# Patient Record
Sex: Male | Born: 1950
Health system: Southern US, Community
[De-identification: ages and names within clinical notes are randomized; demographics above are authoritative.]

## PROBLEM LIST (undated history)

## (undated) DIAGNOSIS — G249 Dystonia, unspecified: Secondary | ICD-10-CM

## (undated) DIAGNOSIS — R590 Localized enlarged lymph nodes: Secondary | ICD-10-CM

## (undated) DIAGNOSIS — G20 Parkinson's disease: Secondary | ICD-10-CM

## (undated) DIAGNOSIS — C8590 Non-Hodgkin lymphoma, unspecified, unspecified site: Secondary | ICD-10-CM

## (undated) HISTORY — DX: Dystonia, unspecified: G24.9

## (undated) HISTORY — DX: Non-Hodgkin lymphoma, unspecified, unspecified site: C85.90

## (undated) HISTORY — PX: TONSILLECTOMY: SUR1361

## (undated) HISTORY — PX: PORTACATH PLACEMENT: SHX2246

## (undated) HISTORY — DX: Parkinson's disease: G20

## (undated) HISTORY — PX: PORT-A-CATH REMOVAL: SHX5289

## (undated) HISTORY — DX: Localized enlarged lymph nodes: R59.0

## (undated) HISTORY — PX: CHOLECYSTECTOMY, LAPAROSCOPIC: SHX56

## (undated) HISTORY — PX: SPLENECTOMY: SUR1306

## (undated) HISTORY — PX: EYE SURGERY: SHX253

---

## 1992-09-22 DIAGNOSIS — G249 Dystonia, unspecified: Secondary | ICD-10-CM

## 1992-09-22 HISTORY — DX: Dystonia, unspecified: G24.9

## 2005-09-22 DIAGNOSIS — C8590 Non-Hodgkin lymphoma, unspecified, unspecified site: Secondary | ICD-10-CM

## 2005-09-22 HISTORY — DX: Non-Hodgkin lymphoma, unspecified, unspecified site: C85.90

## 2005-12-26 ENCOUNTER — Emergency Department (HOSPITAL_COMMUNITY): Admission: EM | Admit: 2005-12-26 | Discharge: 2005-12-26 | Payer: Self-pay | Admitting: Emergency Medicine

## 2006-04-30 ENCOUNTER — Ambulatory Visit: Payer: Self-pay | Admitting: Family Medicine

## 2006-05-04 ENCOUNTER — Ambulatory Visit: Payer: Self-pay | Admitting: Family Medicine

## 2006-05-12 ENCOUNTER — Ambulatory Visit: Payer: Self-pay | Admitting: Infectious Diseases

## 2006-05-27 ENCOUNTER — Ambulatory Visit: Payer: Self-pay | Admitting: Infectious Diseases

## 2006-06-10 ENCOUNTER — Ambulatory Visit: Payer: Self-pay | Admitting: Infectious Diseases

## 2006-06-16 ENCOUNTER — Ambulatory Visit: Payer: Self-pay | Admitting: Family Medicine

## 2006-06-22 ENCOUNTER — Encounter (INDEPENDENT_AMBULATORY_CARE_PROVIDER_SITE_OTHER): Payer: Self-pay | Admitting: *Deleted

## 2006-06-22 ENCOUNTER — Ambulatory Visit (HOSPITAL_COMMUNITY): Admission: RE | Admit: 2006-06-22 | Discharge: 2006-06-22 | Payer: Self-pay

## 2006-06-26 ENCOUNTER — Ambulatory Visit: Payer: Self-pay | Admitting: Infectious Diseases

## 2006-07-03 ENCOUNTER — Ambulatory Visit: Payer: Self-pay | Admitting: Infectious Diseases

## 2006-07-03 ENCOUNTER — Inpatient Hospital Stay (HOSPITAL_COMMUNITY): Admission: AD | Admit: 2006-07-03 | Discharge: 2006-07-10 | Payer: Self-pay | Admitting: Internal Medicine

## 2006-07-05 ENCOUNTER — Encounter (INDEPENDENT_AMBULATORY_CARE_PROVIDER_SITE_OTHER): Payer: Self-pay

## 2006-07-05 ENCOUNTER — Encounter (INDEPENDENT_AMBULATORY_CARE_PROVIDER_SITE_OTHER): Payer: Self-pay | Admitting: *Deleted

## 2006-07-09 ENCOUNTER — Ambulatory Visit: Payer: Self-pay | Admitting: Hematology & Oncology

## 2006-07-22 ENCOUNTER — Inpatient Hospital Stay (HOSPITAL_COMMUNITY): Admission: AD | Admit: 2006-07-22 | Discharge: 2006-07-25 | Payer: Self-pay

## 2006-07-22 ENCOUNTER — Ambulatory Visit: Payer: Self-pay | Admitting: Family Medicine

## 2006-07-22 LAB — CONVERTED CEMR LAB
Basophils Relative: 0.3 % (ref 0.0–1.0)
Eosinophil percent: 0.2 % (ref 0.0–5.0)
Lymphocytes Relative: 4.5 % — ABNORMAL LOW (ref 12.0–46.0)
Neutro Abs: 24.3 10*3/uL — ABNORMAL HIGH (ref 1.4–7.7)
Platelets: 980 10*3/uL — ABNORMAL HIGH (ref 150–400)
WBC: 27.2 10*3/uL (ref 4.5–10.5)

## 2006-08-05 ENCOUNTER — Ambulatory Visit: Payer: Self-pay | Admitting: Hematology & Oncology

## 2006-08-06 ENCOUNTER — Encounter: Admission: RE | Admit: 2006-08-06 | Discharge: 2006-08-06 | Payer: Self-pay

## 2006-08-11 LAB — CBC WITH DIFFERENTIAL/PLATELET
BASO%: 0 % (ref 0.0–2.0)
LYMPH%: 11.3 % — ABNORMAL LOW (ref 14.0–48.0)
MCH: 30.2 pg (ref 28.0–33.4)
MCHC: 32.4 g/dL (ref 32.0–35.9)
MCV: 93.2 fL (ref 81.6–98.0)
MONO%: 15.1 % — ABNORMAL HIGH (ref 0.0–13.0)
Platelets: 692 10*3/uL — ABNORMAL HIGH (ref 145–400)
RBC: 3.7 10*6/uL — ABNORMAL LOW (ref 4.20–5.71)

## 2006-08-11 LAB — COMPREHENSIVE METABOLIC PANEL
Alkaline Phosphatase: 176 U/L — ABNORMAL HIGH (ref 39–117)
Glucose, Bld: 109 mg/dL — ABNORMAL HIGH (ref 70–99)
Sodium: 135 mEq/L (ref 135–145)
Total Bilirubin: 0.4 mg/dL (ref 0.3–1.2)
Total Protein: 7.1 g/dL (ref 6.0–8.3)

## 2006-08-18 ENCOUNTER — Encounter (INDEPENDENT_AMBULATORY_CARE_PROVIDER_SITE_OTHER): Payer: Self-pay | Admitting: Specialist

## 2006-08-18 ENCOUNTER — Ambulatory Visit (HOSPITAL_COMMUNITY): Admission: RE | Admit: 2006-08-18 | Discharge: 2006-08-18 | Payer: Self-pay | Admitting: Hematology & Oncology

## 2006-08-19 ENCOUNTER — Ambulatory Visit (HOSPITAL_COMMUNITY): Admission: RE | Admit: 2006-08-19 | Discharge: 2006-08-19 | Payer: Self-pay | Admitting: Hematology & Oncology

## 2006-08-24 ENCOUNTER — Encounter: Admission: RE | Admit: 2006-08-24 | Discharge: 2006-08-24 | Payer: Self-pay

## 2006-08-25 ENCOUNTER — Ambulatory Visit (HOSPITAL_COMMUNITY): Admission: RE | Admit: 2006-08-25 | Discharge: 2006-08-26 | Payer: Self-pay

## 2006-08-25 ENCOUNTER — Encounter (INDEPENDENT_AMBULATORY_CARE_PROVIDER_SITE_OTHER): Payer: Self-pay | Admitting: *Deleted

## 2006-09-01 LAB — CBC WITH DIFFERENTIAL/PLATELET
Basophils Absolute: 0 10*3/uL (ref 0.0–0.1)
Eosinophils Absolute: 0.3 10*3/uL (ref 0.0–0.5)
HGB: 9.7 g/dL — ABNORMAL LOW (ref 13.0–17.1)
MCV: 92.7 fL (ref 81.6–98.0)
NEUT#: 19.3 10*3/uL — ABNORMAL HIGH (ref 1.5–6.5)
RDW: 17.3 % — ABNORMAL HIGH (ref 11.2–14.6)
lymph#: 0.9 10*3/uL (ref 0.9–3.3)

## 2006-09-01 LAB — COMPREHENSIVE METABOLIC PANEL
Albumin: 2.8 g/dL — ABNORMAL LOW (ref 3.5–5.2)
BUN: 10 mg/dL (ref 6–23)
CO2: 25 mEq/L (ref 19–32)
Calcium: 8.1 mg/dL — ABNORMAL LOW (ref 8.4–10.5)
Chloride: 103 mEq/L (ref 96–112)
Glucose, Bld: 103 mg/dL — ABNORMAL HIGH (ref 70–99)
Potassium: 4 mEq/L (ref 3.5–5.3)

## 2006-09-01 LAB — PROTHROMBIN TIME
INR: 1.1 (ref 0.0–1.5)
Prothrombin Time: 14.3 seconds (ref 11.6–15.2)

## 2006-09-02 ENCOUNTER — Ambulatory Visit (HOSPITAL_BASED_OUTPATIENT_CLINIC_OR_DEPARTMENT_OTHER): Admission: RE | Admit: 2006-09-02 | Discharge: 2006-09-02 | Payer: Self-pay

## 2006-09-10 ENCOUNTER — Ambulatory Visit: Payer: Self-pay | Admitting: Hematology & Oncology

## 2006-09-16 LAB — CBC WITH DIFFERENTIAL/PLATELET
BASO%: 0.7 % (ref 0.0–2.0)
Basophils Absolute: 0.1 10*3/uL (ref 0.0–0.1)
EOS%: 0.9 % (ref 0.0–7.0)
LYMPH%: 32.3 % (ref 14.0–48.0)
MCH: 31.3 pg (ref 28.0–33.4)
MCV: 98.7 fL — ABNORMAL HIGH (ref 81.6–98.0)
MONO%: 5.3 % (ref 0.0–13.0)
RBC: 3.08 10*6/uL — ABNORMAL LOW (ref 4.20–5.71)
RDW: 22.3 % — ABNORMAL HIGH (ref 11.2–14.6)

## 2006-09-24 LAB — CBC WITH DIFFERENTIAL/PLATELET
BASO%: 1.8 % (ref 0.0–2.0)
Basophils Absolute: 0.2 10*3/uL — ABNORMAL HIGH (ref 0.0–0.1)
Eosinophils Absolute: 0.1 10*3/uL (ref 0.0–0.5)
HCT: 35.7 % — ABNORMAL LOW (ref 38.7–49.9)
LYMPH%: 26.9 % (ref 14.0–48.0)
MCHC: 33 g/dL (ref 32.0–35.9)
MONO#: 1.5 10*3/uL — ABNORMAL HIGH (ref 0.1–0.9)
NEUT%: 55.1 % (ref 40.0–75.0)
Platelets: 572 10*3/uL — ABNORMAL HIGH (ref 145–400)
WBC: 9.7 10*3/uL (ref 4.0–10.0)

## 2006-10-07 ENCOUNTER — Ambulatory Visit (HOSPITAL_COMMUNITY): Admission: RE | Admit: 2006-10-07 | Discharge: 2006-10-07 | Payer: Self-pay | Admitting: Hematology & Oncology

## 2006-10-15 LAB — CBC WITH DIFFERENTIAL/PLATELET
BASO%: 1.3 % (ref 0.0–2.0)
EOS%: 2 % (ref 0.0–7.0)
HCT: 37.3 % — ABNORMAL LOW (ref 38.7–49.9)
LYMPH%: 24.6 % (ref 14.0–48.0)
MCH: 33.3 pg (ref 28.0–33.4)
MCHC: 33 g/dL (ref 32.0–35.9)
MCV: 100.8 fL — ABNORMAL HIGH (ref 81.6–98.0)
MONO%: 14.2 % — ABNORMAL HIGH (ref 0.0–13.0)
NEUT%: 57.9 % (ref 40.0–75.0)
Platelets: 622 10*3/uL — ABNORMAL HIGH (ref 145–400)

## 2006-10-15 LAB — COMPREHENSIVE METABOLIC PANEL
ALT: 36 U/L (ref 0–53)
AST: 27 U/L (ref 0–37)
Albumin: 4.1 g/dL (ref 3.5–5.2)
BUN: 13 mg/dL (ref 6–23)
Calcium: 9.3 mg/dL (ref 8.4–10.5)
Chloride: 106 mEq/L (ref 96–112)
Potassium: 4.6 mEq/L (ref 3.5–5.3)
Sodium: 143 mEq/L (ref 135–145)
Total Protein: 6.5 g/dL (ref 6.0–8.3)

## 2006-11-02 ENCOUNTER — Ambulatory Visit: Payer: Self-pay | Admitting: Hematology & Oncology

## 2006-11-05 LAB — CBC WITH DIFFERENTIAL/PLATELET
EOS%: 2.1 % (ref 0.0–7.0)
LYMPH%: 19.7 % (ref 14.0–48.0)
MCH: 33.9 pg — ABNORMAL HIGH (ref 28.0–33.4)
MCHC: 34 g/dL (ref 32.0–35.9)
MCV: 99.6 fL — ABNORMAL HIGH (ref 81.6–98.0)
MONO%: 8.4 % (ref 0.0–13.0)
Platelets: 486 10*3/uL — ABNORMAL HIGH (ref 145–400)
RBC: 3.81 10*6/uL — ABNORMAL LOW (ref 4.20–5.71)
RDW: 22.2 % — ABNORMAL HIGH (ref 11.2–14.6)

## 2006-11-05 LAB — COMPREHENSIVE METABOLIC PANEL
AST: 26 U/L (ref 0–37)
Albumin: 4 g/dL (ref 3.5–5.2)
Alkaline Phosphatase: 95 U/L (ref 39–117)
Potassium: 4.4 mEq/L (ref 3.5–5.3)
Sodium: 143 mEq/L (ref 135–145)
Total Bilirubin: 0.3 mg/dL (ref 0.3–1.2)
Total Protein: 6.5 g/dL (ref 6.0–8.3)

## 2006-11-25 LAB — COMPREHENSIVE METABOLIC PANEL
ALT: 39 U/L (ref 0–53)
BUN: 14 mg/dL (ref 6–23)
CO2: 26 mEq/L (ref 19–32)
Calcium: 9.2 mg/dL (ref 8.4–10.5)
Chloride: 105 mEq/L (ref 96–112)
Creatinine, Ser: 0.77 mg/dL (ref 0.40–1.50)
Glucose, Bld: 102 mg/dL — ABNORMAL HIGH (ref 70–99)
Total Bilirubin: 0.3 mg/dL (ref 0.3–1.2)

## 2006-11-25 LAB — CBC WITH DIFFERENTIAL/PLATELET
Eosinophils Absolute: 0.1 10*3/uL (ref 0.0–0.5)
HCT: 39.4 % (ref 38.7–49.9)
LYMPH%: 18.1 % (ref 14.0–48.0)
MCHC: 34.1 g/dL (ref 32.0–35.9)
MCV: 100.5 fL — ABNORMAL HIGH (ref 81.6–98.0)
MONO#: 1.2 10*3/uL — ABNORMAL HIGH (ref 0.1–0.9)
MONO%: 11.6 % (ref 0.0–13.0)
NEUT#: 7 10*3/uL — ABNORMAL HIGH (ref 1.5–6.5)
NEUT%: 67.9 % (ref 40.0–75.0)
Platelets: 396 10*3/uL (ref 145–400)
RBC: 3.92 10*6/uL — ABNORMAL LOW (ref 4.20–5.71)
WBC: 10.3 10*3/uL — ABNORMAL HIGH (ref 4.0–10.0)

## 2006-12-16 LAB — CBC WITH DIFFERENTIAL/PLATELET
BASO%: 3 % — ABNORMAL HIGH (ref 0.0–2.0)
Eosinophils Absolute: 0.1 10*3/uL (ref 0.0–0.5)
HCT: 40.2 % (ref 38.7–49.9)
MCHC: 34.5 g/dL (ref 32.0–35.9)
MONO#: 1.6 10*3/uL — ABNORMAL HIGH (ref 0.1–0.9)
NEUT#: 3.5 10*3/uL (ref 1.5–6.5)
NEUT%: 42.1 % (ref 40.0–75.0)
WBC: 8.4 10*3/uL (ref 4.0–10.0)
lymph#: 3 10*3/uL (ref 0.9–3.3)

## 2006-12-16 LAB — COMPREHENSIVE METABOLIC PANEL
ALT: 51 U/L (ref 0–53)
AST: 38 U/L — ABNORMAL HIGH (ref 0–37)
Albumin: 4.3 g/dL (ref 3.5–5.2)
BUN: 11 mg/dL (ref 6–23)
CO2: 27 mEq/L (ref 19–32)
Calcium: 9.1 mg/dL (ref 8.4–10.5)
Chloride: 105 mEq/L (ref 96–112)
Potassium: 4.3 mEq/L (ref 3.5–5.3)

## 2006-12-16 LAB — TECHNOLOGIST REVIEW

## 2006-12-16 LAB — LACTATE DEHYDROGENASE: LDH: 145 U/L (ref 94–250)

## 2006-12-18 ENCOUNTER — Ambulatory Visit: Payer: Self-pay | Admitting: Hematology & Oncology

## 2007-01-12 ENCOUNTER — Ambulatory Visit (HOSPITAL_COMMUNITY): Admission: RE | Admit: 2007-01-12 | Discharge: 2007-01-12 | Payer: Self-pay | Admitting: Hematology & Oncology

## 2007-01-28 LAB — COMPREHENSIVE METABOLIC PANEL
Albumin: 4.3 g/dL (ref 3.5–5.2)
Alkaline Phosphatase: 72 U/L (ref 39–117)
BUN: 11 mg/dL (ref 6–23)
Glucose, Bld: 121 mg/dL — ABNORMAL HIGH (ref 70–99)
Total Bilirubin: 0.6 mg/dL (ref 0.3–1.2)

## 2007-01-28 LAB — CBC WITH DIFFERENTIAL/PLATELET
Basophils Absolute: 0.1 10*3/uL (ref 0.0–0.1)
Eosinophils Absolute: 0.8 10*3/uL — ABNORMAL HIGH (ref 0.0–0.5)
HCT: 42.7 % (ref 38.7–49.9)
HGB: 14.6 g/dL (ref 13.0–17.1)
LYMPH%: 32 % (ref 14.0–48.0)
MCV: 99.2 fL — ABNORMAL HIGH (ref 81.6–98.0)
MONO%: 13.3 % — ABNORMAL HIGH (ref 0.0–13.0)
NEUT#: 2.7 10*3/uL (ref 1.5–6.5)
Platelets: 280 10*3/uL (ref 145–400)

## 2007-01-28 LAB — LACTATE DEHYDROGENASE: LDH: 150 U/L (ref 94–250)

## 2007-02-02 ENCOUNTER — Ambulatory Visit: Payer: Self-pay | Admitting: Hematology & Oncology

## 2007-02-25 ENCOUNTER — Encounter (INDEPENDENT_AMBULATORY_CARE_PROVIDER_SITE_OTHER): Payer: Self-pay | Admitting: Family Medicine

## 2007-02-25 LAB — COMPREHENSIVE METABOLIC PANEL
ALT: 43 U/L (ref 0–53)
Albumin: 4.2 g/dL (ref 3.5–5.2)
CO2: 26 mEq/L (ref 19–32)
Calcium: 8.9 mg/dL (ref 8.4–10.5)
Chloride: 107 mEq/L (ref 96–112)
Glucose, Bld: 137 mg/dL — ABNORMAL HIGH (ref 70–99)
Sodium: 141 mEq/L (ref 135–145)
Total Bilirubin: 0.5 mg/dL (ref 0.3–1.2)
Total Protein: 6.5 g/dL (ref 6.0–8.3)

## 2007-02-25 LAB — CBC WITH DIFFERENTIAL/PLATELET
Eosinophils Absolute: 0.3 10*3/uL (ref 0.0–0.5)
HCT: 40 % (ref 38.7–49.9)
LYMPH%: 32.7 % (ref 14.0–48.0)
MONO#: 0.5 10*3/uL (ref 0.1–0.9)
NEUT#: 2.3 10*3/uL (ref 1.5–6.5)
Platelets: 317 10*3/uL (ref 145–400)
RBC: 4.09 10*6/uL — ABNORMAL LOW (ref 4.20–5.71)
WBC: 4.6 10*3/uL (ref 4.0–10.0)
lymph#: 1.5 10*3/uL (ref 0.9–3.3)

## 2007-02-25 LAB — LACTATE DEHYDROGENASE: LDH: 152 U/L (ref 94–250)

## 2007-03-23 DIAGNOSIS — C8590 Non-Hodgkin lymphoma, unspecified, unspecified site: Secondary | ICD-10-CM

## 2007-04-06 ENCOUNTER — Ambulatory Visit: Payer: Self-pay | Admitting: Hematology & Oncology

## 2007-04-08 LAB — CBC WITH DIFFERENTIAL/PLATELET
BASO%: 0.6 % (ref 0.0–2.0)
Basophils Absolute: 0 10*3/uL (ref 0.0–0.1)
EOS%: 6.9 % (ref 0.0–7.0)
Eosinophils Absolute: 0.4 10*3/uL (ref 0.0–0.5)
HCT: 42.8 % (ref 38.7–49.9)
HGB: 14.9 g/dL (ref 13.0–17.1)
LYMPH%: 32.5 % (ref 14.0–48.0)
MCH: 33.5 pg — ABNORMAL HIGH (ref 28.0–33.4)
MCHC: 34.9 g/dL (ref 32.0–35.9)
MCV: 96.3 fL (ref 81.6–98.0)
MONO#: 0.6 10*3/uL (ref 0.1–0.9)
MONO%: 10.8 % (ref 0.0–13.0)
NEUT#: 2.9 10*3/uL (ref 1.5–6.5)
NEUT%: 49.2 % (ref 40.0–75.0)
Platelets: 298 10*3/uL (ref 145–400)
RBC: 4.45 10*6/uL (ref 4.20–5.71)
RDW: 13.5 % (ref 11.2–14.6)
WBC: 5.8 10*3/uL (ref 4.0–10.0)
lymph#: 1.9 10*3/uL (ref 0.9–3.3)

## 2007-04-08 LAB — LACTATE DEHYDROGENASE: LDH: 167 U/L (ref 94–250)

## 2007-04-08 LAB — COMPREHENSIVE METABOLIC PANEL
Albumin: 4.4 g/dL (ref 3.5–5.2)
BUN: 11 mg/dL (ref 6–23)
CO2: 28 mEq/L (ref 19–32)
Calcium: 9 mg/dL (ref 8.4–10.5)
Chloride: 104 mEq/L (ref 96–112)
Creatinine, Ser: 0.82 mg/dL (ref 0.40–1.50)
Glucose, Bld: 96 mg/dL (ref 70–99)
Potassium: 4.2 mEq/L (ref 3.5–5.3)

## 2007-06-03 ENCOUNTER — Ambulatory Visit (HOSPITAL_COMMUNITY): Admission: RE | Admit: 2007-06-03 | Discharge: 2007-06-03 | Payer: Self-pay | Admitting: Hematology & Oncology

## 2007-06-08 ENCOUNTER — Ambulatory Visit: Payer: Self-pay | Admitting: Hematology & Oncology

## 2007-06-10 ENCOUNTER — Encounter (INDEPENDENT_AMBULATORY_CARE_PROVIDER_SITE_OTHER): Payer: Self-pay | Admitting: Family Medicine

## 2007-06-10 LAB — CBC WITH DIFFERENTIAL/PLATELET
Eosinophils Absolute: 0.5 10*3/uL (ref 0.0–0.5)
MCV: 95.3 fL (ref 81.6–98.0)
MONO#: 0.7 10*3/uL (ref 0.1–0.9)
MONO%: 11.2 % (ref 0.0–13.0)
NEUT#: 2.5 10*3/uL (ref 1.5–6.5)
RBC: 4.37 10*6/uL (ref 4.20–5.71)
RDW: 14.6 % (ref 11.2–14.6)
WBC: 5.8 10*3/uL (ref 4.0–10.0)
lymph#: 2.1 10*3/uL (ref 0.9–3.3)

## 2007-06-10 LAB — COMPREHENSIVE METABOLIC PANEL
AST: 25 U/L (ref 0–37)
Albumin: 4.4 g/dL (ref 3.5–5.2)
Alkaline Phosphatase: 67 U/L (ref 39–117)
Chloride: 105 mEq/L (ref 96–112)
Glucose, Bld: 120 mg/dL — ABNORMAL HIGH (ref 70–99)
Potassium: 4.4 mEq/L (ref 3.5–5.3)
Sodium: 142 mEq/L (ref 135–145)
Total Protein: 6.6 g/dL (ref 6.0–8.3)

## 2007-07-28 ENCOUNTER — Ambulatory Visit: Payer: Self-pay | Admitting: Family Medicine

## 2007-07-28 DIAGNOSIS — R599 Enlarged lymph nodes, unspecified: Secondary | ICD-10-CM | POA: Insufficient documentation

## 2007-07-28 HISTORY — DX: Enlarged lymph nodes, unspecified: R59.9

## 2007-08-03 ENCOUNTER — Ambulatory Visit: Payer: Self-pay | Admitting: Hematology & Oncology

## 2007-08-05 ENCOUNTER — Encounter (INDEPENDENT_AMBULATORY_CARE_PROVIDER_SITE_OTHER): Payer: Self-pay | Admitting: Family Medicine

## 2007-08-05 LAB — COMPREHENSIVE METABOLIC PANEL
AST: 28 U/L (ref 0–37)
Alkaline Phosphatase: 68 U/L (ref 39–117)
BUN: 11 mg/dL (ref 6–23)
Glucose, Bld: 124 mg/dL — ABNORMAL HIGH (ref 70–99)
Total Bilirubin: 0.5 mg/dL (ref 0.3–1.2)

## 2007-08-05 LAB — CBC WITH DIFFERENTIAL/PLATELET
Basophils Absolute: 0.1 10*3/uL (ref 0.0–0.1)
EOS%: 10.7 % — ABNORMAL HIGH (ref 0.0–7.0)
Eosinophils Absolute: 0.5 10*3/uL (ref 0.0–0.5)
LYMPH%: 41.4 % (ref 14.0–48.0)
MCH: 33 pg (ref 28.0–33.4)
MCV: 95.2 fL (ref 81.6–98.0)
MONO%: 17.5 % — ABNORMAL HIGH (ref 0.0–13.0)
NEUT#: 1.2 10*3/uL — ABNORMAL LOW (ref 1.5–6.5)
Platelets: 322 10*3/uL (ref 145–400)
RBC: 4.42 10*6/uL (ref 4.20–5.71)

## 2007-08-06 ENCOUNTER — Telehealth (INDEPENDENT_AMBULATORY_CARE_PROVIDER_SITE_OTHER): Payer: Self-pay | Admitting: Family Medicine

## 2007-08-11 ENCOUNTER — Ambulatory Visit (HOSPITAL_COMMUNITY): Admission: RE | Admit: 2007-08-11 | Discharge: 2007-08-11 | Payer: Self-pay | Admitting: Hematology & Oncology

## 2007-09-03 ENCOUNTER — Encounter (INDEPENDENT_AMBULATORY_CARE_PROVIDER_SITE_OTHER): Payer: Self-pay | Admitting: Family Medicine

## 2007-09-28 ENCOUNTER — Ambulatory Visit: Payer: Self-pay | Admitting: Hematology & Oncology

## 2007-11-04 ENCOUNTER — Ambulatory Visit (HOSPITAL_COMMUNITY): Admission: RE | Admit: 2007-11-04 | Discharge: 2007-11-04 | Payer: Self-pay | Admitting: Hematology & Oncology

## 2007-11-04 ENCOUNTER — Encounter (INDEPENDENT_AMBULATORY_CARE_PROVIDER_SITE_OTHER): Payer: Self-pay | Admitting: Family Medicine

## 2007-11-11 LAB — CBC WITH DIFFERENTIAL/PLATELET
BASO%: 0.6 % (ref 0.0–2.0)
Basophils Absolute: 0 10*3/uL (ref 0.0–0.1)
EOS%: 5.1 % (ref 0.0–7.0)
HCT: 44.3 % (ref 38.7–49.9)
HGB: 15.4 g/dL (ref 13.0–17.1)
MCH: 33.4 pg (ref 28.0–33.4)
MCHC: 34.8 g/dL (ref 32.0–35.9)
MCV: 95.9 fL (ref 81.6–98.0)
MONO%: 17.8 % — ABNORMAL HIGH (ref 0.0–13.0)
NEUT%: 45.4 % (ref 40.0–75.0)
RDW: 14.5 % (ref 11.2–14.6)
lymph#: 1.9 10*3/uL (ref 0.9–3.3)

## 2007-11-11 LAB — COMPREHENSIVE METABOLIC PANEL
ALT: 46 U/L (ref 0–53)
AST: 40 U/L — ABNORMAL HIGH (ref 0–37)
Alkaline Phosphatase: 74 U/L (ref 39–117)
BUN: 11 mg/dL (ref 6–23)
Chloride: 105 mEq/L (ref 96–112)
Creatinine, Ser: 0.85 mg/dL (ref 0.40–1.50)

## 2007-12-08 IMAGING — PT NM PET TUM IMG SKULL BASE T - THIGH
4 series · 25 of 25 positions shown · non-contrast
Comparison: CT of the abdomen and pelvis 08/06/06.  No prior PETs.

CLINICAL DATA: Non-Hodgkin?s lymphoma; status-post biopsy and splenectomy 08/05/06.   No treatment. 
FDG PET-CT TUMOR IMAGING (SKULL BASE TO THIGHS):
Fasting Blood Glucose:  132
TECHNIQUE: 16.3 mCi F-18 FDG were administered via right AC injection.  Full ring PET imaging was performed from the skull base through the mid-thighs 62 minutes after injection.  CT data was obtained and used for attenuation correction and anatomic localization only.  (This was not acquired as a diagnostic CT examination.)

[Series 1: pet ac · axial · 3.3mm · 4.69mm/px · z∈[-896,-26]mm · 8 of 267 slices shown]
[im 1/267]
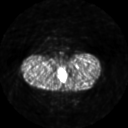
[im 39/267]
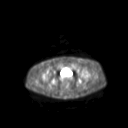
[im 77/267]
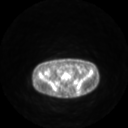
[im 115/267]
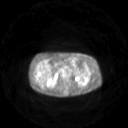
[im 153/267]
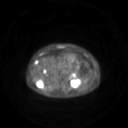
[im 191/267]
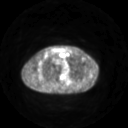
[im 229/267]
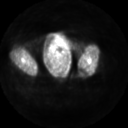
[im 267/267]
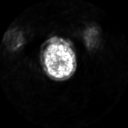

[Series 2: pet nac · axial · 3.3mm · 4.69mm/px · z∈[-896,-26]mm · 8 of 267 slices shown]
[im 1/267]
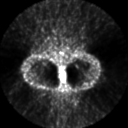
[im 39/267]
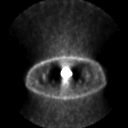
[im 77/267]
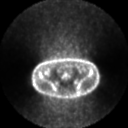
[im 115/267]
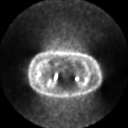
[im 153/267]
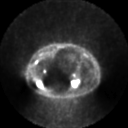
[im 191/267]
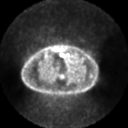
[im 229/267]
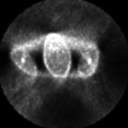
[im 267/267]
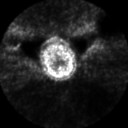

[Series 2: ct images · axial · 3.8mm · 0.98mm/px · z∈[-896,-26]mm · 8 of 267 slices shown]
[im 1/267]
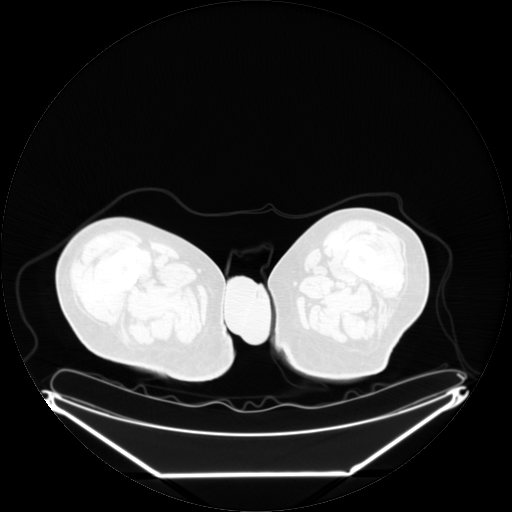
[im 39/267]
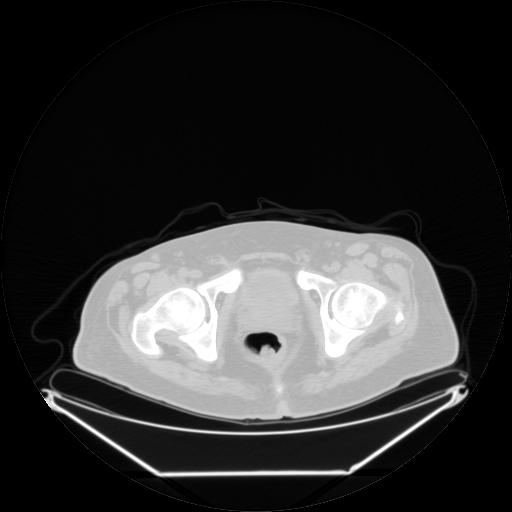
[im 77/267]
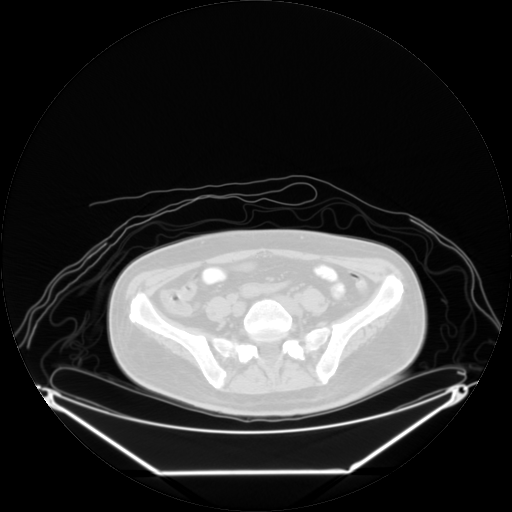
[im 115/267]
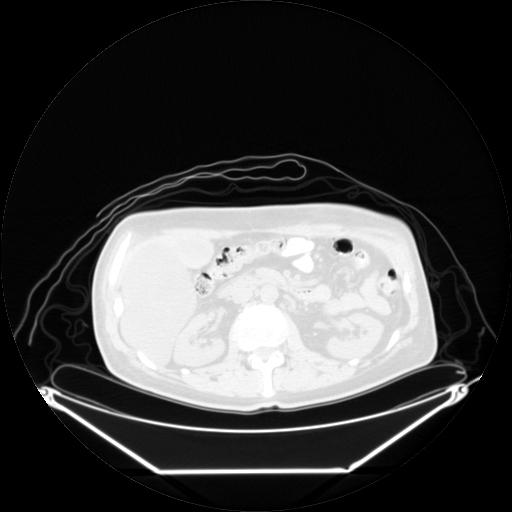
[im 153/267]
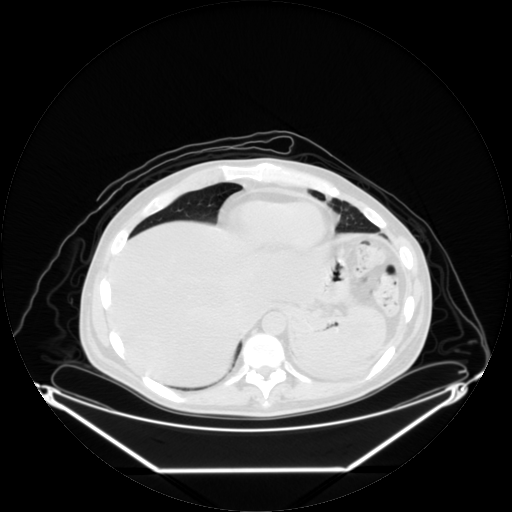
[im 191/267]
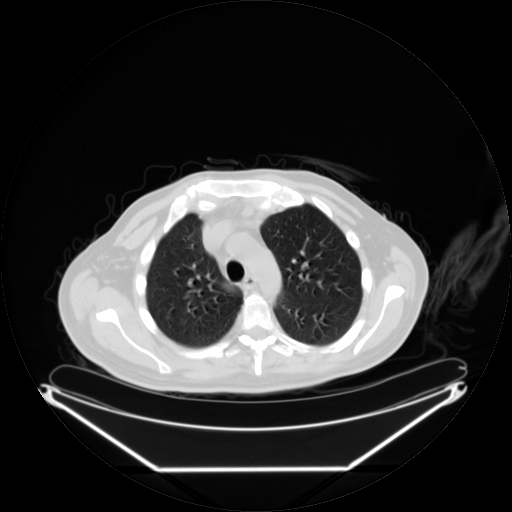
[im 229/267]
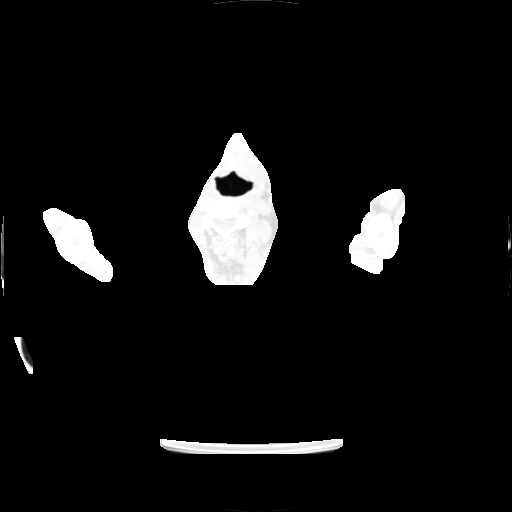
[im 267/267  brain]
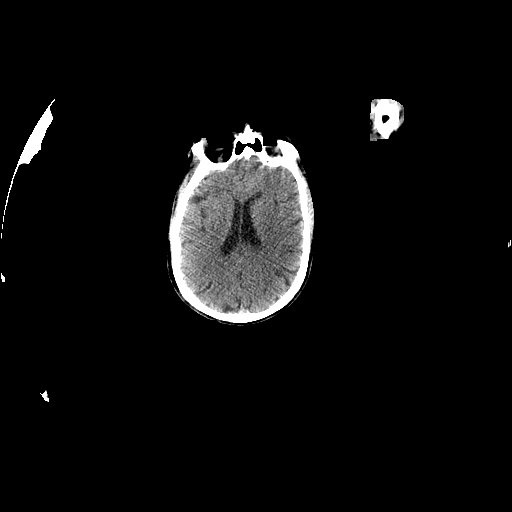

[Series 123: mip · coronal · 3.3mm · 4.69mm/px · 1 of 30 slices shown]
[im 1/30]
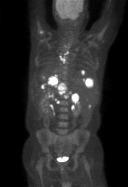

[25 of 25 positions shown; findings below may reference images not displayed]

FINDINGS: PET images demonstrate no abnormal activity within the neck.  Focal increased activity measuring a maximum SUV of 6.9 at the left humeral head.  This is at the site of subtle area of sclerosis (image 47 of CT).  
Extensive thoracic disease.  A lymph node just posterior to the left lobe of the thyroid on image 68 measures a maximum SUV of 5.4.  There is prevascular and left internal mammary nodal disease with an index prevascular node measuring 7 mm and a maximum SUV of 13.9 on image 77.  
There is a probable intramuscular implant within a right intercostal muscle on image 92 measuring a maximum SUV of 2.2.  
Extensive hepatic disease.  An index hepatic dome lesion on image 101 measures a maximum SUV of 16.7.  A lateral segment left liver lobe lesion likely with central necrosis measures a maximum SUV of 17.7 on image 126.  
Gastrohepatic and retroperitoneal hypermetabolic adenopathy.  An index left paraaortic node measures a maximum SUV of 8.2 on image 146.  Index gastrohepatic ligament hypermetabolic node measures a maximum SUV of 9.5 on image 119.  
Extensive hypermetabolism within the body of the stomach measuring a maximum SUV of 24.6.  
No abnormal activity within the pelvis.  
CT images performed for attenuation correction demonstrate no significant findings within the neck.  Small left-sided pleural effusion.  
Fatty liver with fat sparring at the areas of above liver lesions.  Status-post splenectomy with small amount of left upper quadrant fluid.  Small gallstone.
IMPRESSION: 1.  Extensive thoracic and abdominal disease as described above.  No evidence of cervical or pelvic disease. 
2.  Small left pleural effusion.  
3.   Left humeral head activity, felt to most likely relate to osseous involvement.  Rotator cuff tendonopathy is felt less likely. 
4.   Incidental findings, as above.

## 2007-12-28 ENCOUNTER — Ambulatory Visit: Payer: Self-pay | Admitting: Hematology & Oncology

## 2008-02-03 ENCOUNTER — Ambulatory Visit (HOSPITAL_COMMUNITY): Admission: RE | Admit: 2008-02-03 | Discharge: 2008-02-03 | Payer: Self-pay | Admitting: Hematology & Oncology

## 2008-02-08 ENCOUNTER — Ambulatory Visit: Payer: Self-pay | Admitting: Hematology & Oncology

## 2008-02-10 ENCOUNTER — Encounter: Payer: Self-pay | Admitting: Family Medicine

## 2008-02-10 LAB — COMPREHENSIVE METABOLIC PANEL
ALT: 40 U/L (ref 0–53)
AST: 26 U/L (ref 0–37)
CO2: 27 mEq/L (ref 19–32)
Calcium: 9.3 mg/dL (ref 8.4–10.5)
Chloride: 105 mEq/L (ref 96–112)
Sodium: 142 mEq/L (ref 135–145)
Total Protein: 6.8 g/dL (ref 6.0–8.3)

## 2008-02-10 LAB — CBC WITH DIFFERENTIAL/PLATELET
BASO%: 0.6 % (ref 0.0–2.0)
Basophils Absolute: 0 10*3/uL (ref 0.0–0.1)
EOS%: 6.8 % (ref 0.0–7.0)
HGB: 15.4 g/dL (ref 13.0–17.1)
MCH: 33.3 pg (ref 28.0–33.4)
MCHC: 34.3 g/dL (ref 32.0–35.9)
MCV: 97.2 fL (ref 81.6–98.0)
MONO%: 11.1 % (ref 0.0–13.0)
RBC: 4.64 10*6/uL (ref 4.20–5.71)
RDW: 14.8 % — ABNORMAL HIGH (ref 11.2–14.6)
lymph#: 1.8 10*3/uL (ref 0.9–3.3)

## 2008-02-10 LAB — LACTATE DEHYDROGENASE: LDH: 124 U/L (ref 94–250)

## 2008-03-22 ENCOUNTER — Ambulatory Visit: Payer: Self-pay | Admitting: Hematology & Oncology

## 2008-03-27 ENCOUNTER — Ambulatory Visit: Payer: Self-pay | Admitting: Hematology & Oncology

## 2008-05-22 ENCOUNTER — Ambulatory Visit (HOSPITAL_COMMUNITY): Admission: RE | Admit: 2008-05-22 | Discharge: 2008-05-22 | Payer: Self-pay | Admitting: Hematology & Oncology

## 2008-05-23 ENCOUNTER — Ambulatory Visit: Payer: Self-pay | Admitting: Internal Medicine

## 2008-05-23 DIAGNOSIS — R197 Diarrhea, unspecified: Secondary | ICD-10-CM | POA: Insufficient documentation

## 2008-05-24 ENCOUNTER — Ambulatory Visit: Payer: Self-pay | Admitting: Hematology & Oncology

## 2008-05-25 ENCOUNTER — Encounter: Payer: Self-pay | Admitting: Internal Medicine

## 2008-05-25 LAB — CBC WITH DIFFERENTIAL (CANCER CENTER ONLY)
BASO%: 1.2 % (ref 0.0–2.0)
EOS%: 2.7 % (ref 0.0–7.0)
HCT: 40.1 % (ref 38.7–49.9)
HGB: 13.8 g/dL (ref 13.0–17.1)
LYMPH#: 2.1 10*3/uL (ref 0.9–3.3)
MONO#: 1 10*3/uL — ABNORMAL HIGH (ref 0.1–0.9)
NEUT#: 7.4 10*3/uL — ABNORMAL HIGH (ref 1.5–6.5)
NEUT%: 67.9 % (ref 40.0–80.0)
RDW: 12.7 % (ref 10.5–14.6)
WBC: 10.8 10*3/uL — ABNORMAL HIGH (ref 4.0–10.0)

## 2008-05-25 LAB — COMPREHENSIVE METABOLIC PANEL
ALT: 164 U/L — ABNORMAL HIGH (ref 0–53)
AST: 36 U/L (ref 0–37)
Albumin: 3.8 g/dL (ref 3.5–5.2)
CO2: 27 mEq/L (ref 19–32)
Calcium: 8.5 mg/dL (ref 8.4–10.5)
Chloride: 106 mEq/L (ref 96–112)
Potassium: 4.2 mEq/L (ref 3.5–5.3)
Sodium: 143 mEq/L (ref 135–145)
Total Protein: 6.1 g/dL (ref 6.0–8.3)

## 2008-05-25 LAB — LACTATE DEHYDROGENASE: LDH: 142 U/L (ref 94–250)

## 2008-07-06 ENCOUNTER — Ambulatory Visit: Payer: Self-pay | Admitting: Internal Medicine

## 2008-07-06 DIAGNOSIS — R7401 Elevation of levels of liver transaminase levels: Secondary | ICD-10-CM | POA: Insufficient documentation

## 2008-07-06 DIAGNOSIS — R740 Nonspecific elevation of levels of transaminase and lactic acid dehydrogenase [LDH]: Secondary | ICD-10-CM

## 2008-07-06 HISTORY — DX: Elevation of levels of liver transaminase levels: R74.01

## 2008-07-06 LAB — CONVERTED CEMR LAB
AST: 28 units/L (ref 0–37)
Albumin: 4.1 g/dL (ref 3.5–5.2)
Alkaline Phosphatase: 57 units/L (ref 39–117)
Total Protein: 6.8 g/dL (ref 6.0–8.3)

## 2008-07-14 ENCOUNTER — Telehealth: Payer: Self-pay | Admitting: Internal Medicine

## 2008-08-24 ENCOUNTER — Ambulatory Visit (HOSPITAL_COMMUNITY): Admission: RE | Admit: 2008-08-24 | Discharge: 2008-08-24 | Payer: Self-pay | Admitting: Hematology & Oncology

## 2008-08-30 ENCOUNTER — Ambulatory Visit: Payer: Self-pay | Admitting: Hematology & Oncology

## 2008-08-31 ENCOUNTER — Encounter: Payer: Self-pay | Admitting: Internal Medicine

## 2008-10-25 ENCOUNTER — Ambulatory Visit: Payer: Self-pay | Admitting: Hematology & Oncology

## 2008-12-13 ENCOUNTER — Ambulatory Visit: Payer: Self-pay | Admitting: Hematology & Oncology

## 2008-12-28 ENCOUNTER — Ambulatory Visit (HOSPITAL_COMMUNITY): Admission: RE | Admit: 2008-12-28 | Discharge: 2008-12-28 | Payer: Self-pay | Admitting: Hematology & Oncology

## 2009-01-11 ENCOUNTER — Encounter: Payer: Self-pay | Admitting: Internal Medicine

## 2009-01-11 LAB — CBC WITH DIFFERENTIAL (CANCER CENTER ONLY)
BASO%: 0.7 % (ref 0.0–2.0)
HCT: 41.5 % (ref 38.7–49.9)
LYMPH%: 34.7 % (ref 14.0–48.0)
MCH: 33.5 pg — ABNORMAL HIGH (ref 28.0–33.4)
MCHC: 34.1 g/dL (ref 32.0–35.9)
MCV: 98 fL (ref 82–98)
MONO#: 0.6 10*3/uL (ref 0.1–0.9)
NEUT%: 50.9 % (ref 40.0–80.0)
RDW: 11.5 % (ref 10.5–14.6)
WBC: 6.7 10*3/uL (ref 4.0–10.0)

## 2009-01-11 LAB — COMPREHENSIVE METABOLIC PANEL
Albumin: 4.3 g/dL (ref 3.5–5.2)
BUN: 11 mg/dL (ref 6–23)
Calcium: 8.8 mg/dL (ref 8.4–10.5)
Chloride: 106 mEq/L (ref 96–112)
Creatinine, Ser: 0.94 mg/dL (ref 0.40–1.50)
Glucose, Bld: 99 mg/dL (ref 70–99)
Potassium: 4.2 mEq/L (ref 3.5–5.3)

## 2009-01-31 ENCOUNTER — Ambulatory Visit: Payer: Self-pay | Admitting: Hematology & Oncology

## 2009-03-20 ENCOUNTER — Ambulatory Visit: Payer: Self-pay | Admitting: Hematology & Oncology

## 2009-04-25 ENCOUNTER — Ambulatory Visit: Payer: Self-pay | Admitting: Hematology & Oncology

## 2009-06-05 ENCOUNTER — Ambulatory Visit: Payer: Self-pay | Admitting: Hematology & Oncology

## 2009-07-26 ENCOUNTER — Ambulatory Visit: Payer: Self-pay | Admitting: Hematology & Oncology

## 2009-07-27 ENCOUNTER — Encounter: Payer: Self-pay | Admitting: Internal Medicine

## 2009-07-27 LAB — CBC WITH DIFFERENTIAL (CANCER CENTER ONLY)
BASO#: 0.1 10*3/uL (ref 0.0–0.2)
Eosinophils Absolute: 0.3 10*3/uL (ref 0.0–0.5)
HCT: 42.4 % (ref 38.7–49.9)
HGB: 15.1 g/dL (ref 13.0–17.1)
LYMPH%: 38.1 % (ref 14.0–48.0)
MCV: 96 fL (ref 82–98)
MONO#: 0.5 10*3/uL (ref 0.1–0.9)
NEUT%: 48.6 % (ref 40.0–80.0)
RBC: 4.43 10*6/uL (ref 4.20–5.70)
WBC: 7 10*3/uL (ref 4.0–10.0)

## 2009-07-27 LAB — COMPREHENSIVE METABOLIC PANEL
ALT: 32 U/L (ref 0–53)
AST: 20 U/L (ref 0–37)
CO2: 27 mEq/L (ref 19–32)
Chloride: 106 mEq/L (ref 96–112)
Creatinine, Ser: 0.89 mg/dL (ref 0.40–1.50)
Sodium: 143 mEq/L (ref 135–145)
Total Bilirubin: 0.6 mg/dL (ref 0.3–1.2)
Total Protein: 6.3 g/dL (ref 6.0–8.3)

## 2009-07-27 LAB — LACTATE DEHYDROGENASE: LDH: 127 U/L (ref 94–250)

## 2009-07-27 LAB — VITAMIN D 25 HYDROXY (VIT D DEFICIENCY, FRACTURES): Vit D, 25-Hydroxy: 23 ng/mL — ABNORMAL LOW (ref 30–89)

## 2009-09-07 ENCOUNTER — Telehealth: Payer: Self-pay | Admitting: Internal Medicine

## 2009-10-03 ENCOUNTER — Ambulatory Visit (HOSPITAL_BASED_OUTPATIENT_CLINIC_OR_DEPARTMENT_OTHER): Admission: RE | Admit: 2009-10-03 | Discharge: 2009-10-03 | Payer: Self-pay | Admitting: General Surgery

## 2010-01-23 ENCOUNTER — Ambulatory Visit: Payer: Self-pay | Admitting: Hematology & Oncology

## 2010-01-24 ENCOUNTER — Encounter: Payer: Self-pay | Admitting: Internal Medicine

## 2010-01-24 LAB — COMPREHENSIVE METABOLIC PANEL
ALT: 31 U/L (ref 0–53)
Alkaline Phosphatase: 54 U/L (ref 39–117)
Sodium: 140 mEq/L (ref 135–145)
Total Bilirubin: 0.7 mg/dL (ref 0.3–1.2)
Total Protein: 6.9 g/dL (ref 6.0–8.3)

## 2010-01-24 LAB — CBC WITH DIFFERENTIAL (CANCER CENTER ONLY)
BASO%: 1.1 % (ref 0.0–2.0)
EOS%: 5.6 % (ref 0.0–7.0)
HGB: 15.6 g/dL (ref 13.0–17.1)
LYMPH#: 3.1 10*3/uL (ref 0.9–3.3)
MCHC: 34.3 g/dL (ref 32.0–35.9)
NEUT#: 3.2 10*3/uL (ref 1.5–6.5)
RDW: 12.2 % (ref 10.5–14.6)

## 2010-01-24 LAB — TECHNOLOGIST REVIEW CHCC SATELLITE

## 2010-01-24 LAB — LACTATE DEHYDROGENASE: LDH: 115 U/L (ref 94–250)

## 2010-07-24 ENCOUNTER — Ambulatory Visit: Payer: Self-pay | Admitting: Hematology & Oncology

## 2010-07-25 ENCOUNTER — Encounter: Payer: Self-pay | Admitting: Internal Medicine

## 2010-07-25 LAB — LACTATE DEHYDROGENASE: LDH: 125 U/L (ref 94–250)

## 2010-07-25 LAB — CBC WITH DIFFERENTIAL (CANCER CENTER ONLY)
BASO#: 0.1 10*3/uL (ref 0.0–0.2)
BASO%: 0.7 % (ref 0.0–2.0)
HCT: 45.3 % (ref 38.7–49.9)
LYMPH%: 39.7 % (ref 14.0–48.0)
MCHC: 34 g/dL (ref 32.0–35.9)
MCV: 98 fL (ref 82–98)
MONO#: 0.7 10*3/uL (ref 0.1–0.9)
NEUT%: 47.1 % (ref 40.0–80.0)
RDW: 12.1 % (ref 10.5–14.6)
WBC: 7.7 10*3/uL (ref 4.0–10.0)

## 2010-07-25 LAB — COMPREHENSIVE METABOLIC PANEL
AST: 21 U/L (ref 0–37)
BUN: 16 mg/dL (ref 6–23)
Calcium: 9.2 mg/dL (ref 8.4–10.5)
Chloride: 104 mEq/L (ref 96–112)
Creatinine, Ser: 0.91 mg/dL (ref 0.40–1.50)
Glucose, Bld: 87 mg/dL (ref 70–99)

## 2010-07-25 LAB — VITAMIN D 25 HYDROXY (VIT D DEFICIENCY, FRACTURES): Vit D, 25-Hydroxy: 40 ng/mL (ref 30–89)

## 2010-10-13 ENCOUNTER — Encounter: Payer: Self-pay | Admitting: Hematology & Oncology

## 2010-10-22 NOTE — Letter (Signed)
Summary: [facility]  [facility]   Imported By: Maryln Gottron 08/20/2010 11:05:31  _____________________________________________________________________  External Attachment:    Type:   Image     Comment:   External Document

## 2010-10-22 NOTE — Letter (Signed)
[facility]   Imported By: Lanelle Bal 03/01/2010 13:12:46  _____________________________________________________________________  External Attachment:    Type:   Image     Comment:   External Document

## 2010-12-08 LAB — POCT I-STAT 4, (NA,K, GLUC, HGB,HCT)
Glucose, Bld: 93 mg/dL (ref 70–99)
Potassium: 3.9 mEq/L (ref 3.5–5.1)
Sodium: 144 mEq/L (ref 135–145)

## 2011-01-01 LAB — GLUCOSE, CAPILLARY: Glucose-Capillary: 102 mg/dL — ABNORMAL HIGH (ref 70–99)

## 2011-01-30 ENCOUNTER — Encounter (HOSPITAL_BASED_OUTPATIENT_CLINIC_OR_DEPARTMENT_OTHER): Payer: BC Managed Care – PPO | Admitting: Hematology & Oncology

## 2011-01-30 ENCOUNTER — Other Ambulatory Visit: Payer: Self-pay | Admitting: Hematology & Oncology

## 2011-01-30 DIAGNOSIS — C8589 Other specified types of non-Hodgkin lymphoma, extranodal and solid organ sites: Secondary | ICD-10-CM

## 2011-01-30 LAB — CBC WITH DIFFERENTIAL (CANCER CENTER ONLY)
BASO#: 0 10*3/uL (ref 0.0–0.2)
EOS%: 3.8 % (ref 0.0–7.0)
Eosinophils Absolute: 0.4 10*3/uL (ref 0.0–0.5)
HCT: 42.9 % (ref 38.7–49.9)
HGB: 15 g/dL (ref 13.0–17.1)
LYMPH%: 33.8 % (ref 14.0–48.0)
MCH: 32.5 pg (ref 28.0–33.4)
MCHC: 35 g/dL (ref 32.0–35.9)
MCV: 93 fL (ref 82–98)
MONO%: 12.2 % (ref 0.0–13.0)
NEUT%: 49.9 % (ref 40.0–80.0)
RBC: 4.61 10*6/uL (ref 4.20–5.70)

## 2011-01-30 LAB — COMPREHENSIVE METABOLIC PANEL
AST: 18 U/L (ref 0–37)
Albumin: 4.4 g/dL (ref 3.5–5.2)
Alkaline Phosphatase: 51 U/L (ref 39–117)
BUN: 15 mg/dL (ref 6–23)
Creatinine, Ser: 0.95 mg/dL (ref 0.40–1.50)
Glucose, Bld: 92 mg/dL (ref 70–99)

## 2011-01-30 LAB — VITAMIN D 25 HYDROXY (VIT D DEFICIENCY, FRACTURES): Vit D, 25-Hydroxy: 38 ng/mL (ref 30–89)

## 2011-02-07 NOTE — Op Note (Signed)
NAMEALDO, [name] [name]              ACCOUNT NO.:  [id number]   MEDICAL RECORD NO.:  [id number]          PATIENT TYPE:  AMB   LOCATION:  DSC                          FACILITY:  [facility]   PHYSICIAN:  Lebron Conners, M.D.   DATE OF BIRTH:  1951/01/29   DATE OF PROCEDURE:  09/02/2006  DATE OF DISCHARGE:                               OPERATIVE REPORT   PRE AND POSTOPERATIVE DIAGNOSIS:  Lymphoma.   OPERATION:  Implantation of central venous port.   SURGEON:  Lebron Conners, M.D.   ANESTHESIA:  Local with sedation and monitored anesthesia care.   SPECIMEN:  None.   BLOOD LOSS:  Minimal.   COMPLICATIONS:  None.  The patient to PACU in good condition.   PROCEDURE:  After the patient was monitored and sedated and had routine  preparation and draping of the anterior chest and anterior and lateral  neck, and I infiltrated local anesthetic in the deltopectoral groove and  in the anterior chest wall adjacent to that.  I made a small incision  just below the clavicle in the deltopectoral groove and then accessed  the subclavian vein with ease noting no leakage of air or arterial  blood.  I then passed a J-wire through the needle and manipulated it so  that it went down into the vena cava.  I then made an incision adjacent  to the first one and created a pocket of adequate size to hold the  selected device port.  I implanted that into the deep subcutaneous  tissues using two sutures of 2-0 Prolene and I pulled the venous tubing  through to the first incision.  Using fluoroscopy, I cut it to estimated  necessary length to reach to the distal superior vena cava.  With the  patient again placed in Trendelenburg position, I dilated the tract over  the J-wire and passed the dilator and introducer assembly over the J-  wire then removed the J-wire and confirmed intravenous position by  withdrawal of venous blood.  I then withdrew the dilator and passed the  venous tubing through the introducer and  peeled away the introducer.  It  stayed in position nicely and fluoroscopy confirmed good position.  Final chest x-ray is pending.  I checked and made sure that the device  allowed easy withdrawal of blood and easy flushing.  I closed all the  skin incisions with intracuticular 4-0 Vicryl and Steri-Strips.  I then  accessed the device with the right angle Huber needle with attached the  short tubing and I again confirmed with easy withdrawal of blood and I  flushed the device copiously with dilute heparin solution then filled it  with heparin concentrate and capped it.  I applied a bulky slightly  compressive bandage and made occlusive.  He tolerated the operation  well.      Lebron Conners, M.D.  Electronically Signed    WB/MEDQ  D:  09/02/2006  T:  09/02/2006  Job:  [id number]

## 2011-02-07 NOTE — Assessment & Plan Note (Signed)
[facility]                          [place] OFFICE NOTE   NAME:[name] [name]                       MRN:          [id number]  DATE:07/22/2006                            DOB:          10-27-1950    REASON FOR VISIT:  Fever off and on.   [name] [name] is a 60 year old male who recently had a splenectomy secondary  to a lesion noted in the spleen which was subsequently diagnosed as a non-  Hodgkin's lymphoma.  [name] [name] reports that he has been having low grade  fevers off and on since Thursday of last week.  Last night the fever spiked  to about 103 to 104.  He also noted coughing but that started after the  fever.  He did report some increased abdominal discomfort which he stated  was different than the pain after the surgery.  The wife states that the  incision site appeared well healed with no signs of infection or drainage  since the sutures were removed last Thursday. Of note the fevers again occur  only at night.   PAST MEDICAL HISTORY:  1. Dystonia.  2. Large cell lymphoma of the spleen, status post splenectomy.  3. Tonsillectomy.   MEDICATIONS:  Please see medication list.   REVIEW OF SYSTEMS:  As per HPI otherwise patient denied any chest pain,  shortness of breath or dyspnea on exertion.  Did complain of fatigue.  He  denied any diarrhea, constipation or blood in his stool.  He did complain of  left shoulder pain which he has had in the past, secondary to his dystonia.   OBJECTIVE:  Weight 147.  Temperature 98.7.  Pulse 60.  Blood pressure  100/60.  We have a pleasant male who looks tired but no acute distress.  HEENT:  Nasal mucosa is slightly boggy with clear nasal discharge.  Oropharynx benign.  LUNGS:  Clear with good air movement.  No rhonchi, wheezing or crackles were  noted.  HEART:  Was regular rate and rhythm.  Normal S1 and S2.  No murmurs, gallops  or rubs heard on my examination.  ABDOMEN:  Is significant  for a healed scar in the left upper quadrant with  no drainage or erythema surrounding it.  There was marked tenderness in the  region with guarding.  No palpable masses.  No hepatomegaly was noted.   IMPRESSION:  A 60 year old male presenting with fever, abdominal pain,  status post splenectomy.  He also complained of cough but that appeared to  have started after the fever.  Given the physical examination I am concerned  about a abdominal infection below the incision site i.e. abscess.  Additionally need to consider pneumonia.  Although the pulmonary exam was  unremarkable, but none the less, it is still possible.   PLAN:  1. Will obtain a CBC with differential stat.  2. Will refer patient to [facility] for a stat CT of the      abdomen to rule out any      obvious pathology.  3. Advised both the patient and his wife to wait there  until I receive the      information from the radiologist.  4. Further recommendation after review of CT, chest x-ray and CBC.   ADDENDUM:  [name] [name] was seen in [facility]. I was  subsequently called by the radiologist and was told that he had a 13 cm air  fluid mass in the left upper quadrant, consistent with an abscess.  Additionally he had 500 to 700 cc of fluid in the abdomen which may be  secondary to reactive inflammation to from the surgery.  He was also noted  to have  new lesion in his liver, which could be another abscess but is  unclear.  Chest x-ray was not read at the time.   Additionally, I received a call from the lab stating that [name] [name]  white count was 27,000.  With the above information I went ahead and called  Dr. Cammie Sickle office.  Dr. Orson Slick returned my call and we discussed the  history, physical examination and laboratory and imaging studies.  I was  agreed that patient would be admitted to [facility] under the surgery  service.  As per Dr. Orson Slick I went ahead and called Bonita Quin at his office to  set  up [name] [name] for admission at [facility].  I spoke to Mrs. Hamada  and advised her of the findings and the above plan. I also provided her the  phone number to Dr. Cammie Sickle office in case she needs to contact them.  Wife  expressed understanding.     ______________________________  Leanne Chang, M.D.    LA/MedQ  DD: 07/23/2006  DT: 07/23/2006  Job #: [id number]

## 2011-02-07 NOTE — H&P (Signed)
NAMELORANZO, DESHA              ACCOUNT NO.:  [id number]   MEDICAL RECORD NO.:  [id number]          PATIENT TYPE:  INP   LOCATION:  5711                         FACILITY:  [facility]   PHYSICIAN:  Andres Shad. Rudean Curt, MD     DATE OF BIRTH:  16-Jul-1951   DATE OF ADMISSION:  07/03/2006  DATE OF DISCHARGE:                              HISTORY & PHYSICAL   PRIMARY CARE PHYSICIAN:  Dr. Leanne Chang.   CHIEF COMPLAINT:  Fever of unknown origin and splenic abscesses.   HISTORY OF PRESENT ILLNESS:  Mr. Situ is a 60 year old male with a  medical history remarkable for diffuse dystonic spasms particularly of  the head and neck that have resulted in vocal cord dysfunction and have  been treated with botulinum injections in the vocal cords.  This  condition was diagnosed in 1994.  He was otherwise in his usual state of  health until approximately the beginning of May 2007 when he began to  experience daily fevers that were around 99-100 degrees in the morning  but rose as high as 101-102 in the afternoons.  He was evaluated  initially by his primary care physician who diagnosed with a walking  pneumonia and treated him with an empiric course of Biaxin.  He did not  have any clinical improvement on this regimen however and continued to  have daily temperature spikes.  These persisted from the beginning of  May up until the current time and he states that he has not had any days  without fever during that 34-month period. A workup has been undertaken  by both Dr. Blossom Hoops and Dr. Ninetta Lights in Infectious Disease that as of  yet have not revealed the etiology of his fevers.  As part of his  evaluation, he has had several blood cultures that have had no growth  and a number of serologic tests such as Mycoplasma and Chlamydia  serology that have been negative.  His ACE level is 44 which is within  normal limits and his LDH is slightly elevated at 378.  His white blood  cell counts have been slightly  elevated in the 15,000-16,000 range with  polymorph nuclear predominance.  He has also had several empiric  antibiotic courses including with moxifloxacin and amoxicillin  clavulanate, none of which have resulted in any clinic improvement.  Because of his vocal cord dysfunction, it was thought that he may be  having microaspirations leading to aspiration pneumonias or lung  abscesses.  Thus, a CT scan of the chest was performed in September that  did not reveal any pulmonary disease.  However, a cystic lesion in the  spleen was seen on this study leading to a CT scan of the abdomen and  pelvis that was performed on June 04, 2006.  This study was  remarkable for splenomegaly with the spleen 15 x 10 cm in size that  contained multiple low-density discreet and confluent masses varying in  size from 1-8 cm.  These were thought to most likely represent  abscesses.  However, according to the radiology readings, lymphoma and  sarcoid were both in  the differential diagnosis.  Also noted on the CT  scan was mild diffuse fatty infiltration of the liver.  However, no  other abdominal masses or cystic lesions were seen in any of the other  organs and no abdominal adenopathy was noted.  Because of this finding,  the patient was referred for an ultrasound-guided aspirate of one of his  splenic abscesses.  Fluid was sent for stains and culture for anaerobic  and aerobic bacteria, fungus, and AFB.  No organisms were seen on AFB,  fungal, or Gram stain, and there was no growth in any of the cultures.  Moderate white blood cells, both PMN and mononuclear, were seen on Gram  stain of the specimen.  Since that time, the patient has actually had an  increase in fevers with temperatures as high as 102-103 including some  profuse sweating during the episodes of fever.  Because of the lack of  clinical improvement, the patient was referred to the hospital for  further evaluation including a transesophageal  echocardiogram to  investigate endocarditis as the source of the splenic abscesses as well  as a splenectomy both for diagnostic and therapeutic purposes.  The  patient received vaccines against encapsulated bacteria including  meningococcus, Haemophilus influenza type B, and Pneumovax prior to  admission.   REVIEW OF SYSTEMS:  The patient continues to have dystonic movements  which he has had since 1994; these have not deteriorated during the  course of this illness.  He has had a mild cough productive of clear  sputum.  This has been constant throughout this illness but it is not  significantly worse than what he had before this illness began.  He  denies any gastrointestinal complaints including nausea, vomiting, early  satiety, diarrhea, or constipation. There has been no blood in his  stools and no mucus.  He has had a significant weight loss during the  course of this illness and a diminished appetite.  He denies any rash or  skin lesions, any easy bruising or bleeding, any urinary symptoms  including urgency, discomfort, or hematuria.   FAMILY HISTORY:  The patient has three living children, two are healthy,  one has a history of common variable immunodeficiency with  hypogammaglobulinemia.  He is currently 60 years old and in good health  but when he was young, particularly up at the age of 4, he had many  hospital admissions for invasive bacterial infections.  These were most  frequently pneumonia but he had other infections as well.  He was  treated both with antibiotics and with intravenous immune globulin.  The  patient and his wife also have lost 3 children, one was a miscarriage  and two died during infancy, one died on the day of birth and one at 40  months of age. The two that died after birth were known to have an  inborn error of metabolism.  However, the patient and his wife do not recall the name of this disorder.  It is also presumed that the  pregnancy that was  lost also suffered from the same inborn error of  metabolism.  They have not discussed at length whether the common  variable immunodeficiency in their 11 year old son is related to this  genetic disease that their other children have.  The other two children  are in good health.  During the course of Mr. Foree illness, he has  not been investigated for a primary immunodeficiency and he himself does  not give  a history suggestive of frequent or severe infections  throughout his life.  In fact, he states he has been in good health and  rarely had significant illnesses throughout childhood.  There is no  other history of significant diseases of immunity or diseases of  children within the family.   SOCIAL HISTORY:  The patient lives with his wife.  He has three living  children.  He denies a history of smoking, drug abuse, or alcohol use.  He is employed as a Charity fundraiser.  He travels frequently, most  commonly to [place].  He occasionally travels to [facility] where he  has botulinum injections into his vocal cords.  He denies spending any  significant time in the [facility], in [place], and he has  never traveled overseas except once to [place] when he was a child.  He  denies any sick contact.  He has no known contacts with tuberculosis.  His daughter recently spent 4 months abroad in  [place] but she was well  when she returned and she returned after this illness began.  His only  animal exposure is a Development worker, international aid.  He did have a pet bird as a child.  He  denies contact with any exotic animals, reptiles, birds, or farm  animals.  He denies any unusual food preferences.  He did not drink any  unpasteurized milk, he does not care for soft cheeses, and he did not  eat any raw or undercooked meat or fish.   PAST MEDICAL HISTORY:  Past medical history is unremarkable except for  the diffuse dystonic disease, in particular vocal cord dysfunction.   MEDICATIONS:  A baby  aspirin daily; Nexium; he is currently on Augmentin  as prescribed by Dr. Ninetta Lights; he gets occasional botulinum injections in  his vocal cords.   PHYSICAL EXAMINATION:  VITAL SIGNS:  Temperature 99.8.  Pulse 108.  Respiratory rate 18.  Blood pressure 99/68.  Oxygen saturation 100% on  room air.  GENERAL APPEARANCE:  The patient appeared well and was in no acute  distress.  He had obvious dystonic movements of his face and dysphonia  while speaking.  HEENT:  There were no petechial hemorrhages seen in his conjunctivae.  Mucous membranes were moist.  There was no thrush.  No adenopathy was  palpated in his neck, supraclavicular areas, axillae, or groin.  NECK:  Supple.  There was no thyromegaly.  CHEST:  Clear to auscultation bilaterally.  CARDIAC:  The patient was tachycardiac.  There was a 1 out of 6 flow  murmur heard best at the left upper sternal border. ABDOMEN:  Normal bowel sounds; soft, nontender, nondistended.  The liver  was not palpable.  The spleen was markedly enlarged.  GU:  The genitourinary exam was unremarkable.  EXTREMITIES:  Extremities were warm, well perfused.  There was no  clubbing, cyanosis, or edema.  No splinter hemorrhages or Janeway  lesions were seen.   LABORATORY DATA:  No current laboratory studies are available at this  time.   IMPRESSION:  This is a 60 year old man with a fever of unknown origin  that is almost certainly related to numerous splenic abscesses.  The  patient does not have an exposure history that suggests paracytic or  cystic diseases such as  __________ or cysticercosis.  Thus, serology  will not be sent for these organisms.  The most likely source of the  multiple splenic lesions would a hematogenous source and in the absence  of any  focal symptoms such suggestive of pulmonary disease or  osteomyelitis for example the biggest concern is bacterial endocarditis.  It is noteworthy that the patient's son has a primary immunodeficiency   disease.  These diseases such as common variable immunodeficiency can  have variable severity depending on the patient.  Thus, it is worth  investigating whether this patient himself has some primary defect of  the immune system.   PLAN:  1. The patient has been referred for splenectomy and Surgery is aware      of his admission.  This may happen as early as tomorrow.  Thus, the      patient will be kept NPO after midnight and Surgery will be      notified that he is here.  2. Because of the concern for endocarditis, repeat blood cultures will      be drawn.  These will be held for 21 days in the laboratory to      investigate for HACEK organisms.  However, the likelihood that      these will be positive is very low considering that he has been on      antibiotics lately.  We will also ask Cardiology to perform a      transesophageal echocardiogram.  Hopefully, this can be done while      the patient is sedated for his splenectomy.  3. Immunodeficiency workup.  I have ordered quantitative      immunoglobulins, IgG subclasses, and lymphocyte subsets.  I have      also ordered studies for possible fungal infection including serum      cryptococcal antigen, galactomannan, and beta-D-glucan.  I have not      ordered studies for endemic fungi such as Histoplasma and      Coccidioides because the patient does not have a likely exposure      history.  4. Dystonia.  The patient will remain on a proton pump inhibitor.      However, no other special management is required during this      hospitalization.      Andres Shad. Rudean Curt, MD  Electronically Signed     PML/MEDQ  D:  07/03/2006  T:  07/04/2006  Job:  [id number]   cc:   Leanne Chang, M.D.  Lacretia Leigh. Ninetta Lights, M.D.

## 2011-02-07 NOTE — Consult Note (Signed)
[name] [name], [name] [name]              ACCOUNT NO.:  [id number]   MEDICAL RECORD NO.:  [id number]          PATIENT TYPE:  INP   LOCATION:  5711                         FACILITY:  [facility]   PHYSICIAN:  Rose Phi. Myna Hidalgo, M.D. DATE OF BIRTH:  [birth year]   DATE OF CONSULTATION:  07/09/2006  DATE OF DISCHARGE:                                   CONSULTATION   REFERRING PHYSICIAN:  Cliffton Asters, M.D.   REASON FOR CONSULTATION:  1. Diffuse large cell lymphoma - stage unknown.  2. The patient is status post splenectomy.   HISTORY OF PRESENT ILLNESS:  Mr. Eddings is a very nice 60 year old white  gentleman who is originally from [place].  He works for [employer].  He has been in [place] for 11 years.   He has history of dystonic spasms of the head and neck.  He has been getting  Botox  injections into the vocal cords.   Since May, he has been having fevers.  The temperatures have been going up  to 101-102.  It was felt that he may have had walking pneumonia.  He was  treated with antibiotics.   He continues to have problems.  He has lost about 40 pounds over the past  five or six months.   He was seen by infectious disease.  He had numerous studies done.  However,  he had a normal ACE level.  LDH was minimally elevated at 378.  His white  blood cell count was slightly elevated at 15-16,000.   He said this morning he had a CT scan in September.  This was unremarkable.  He was found to have a cystic lesion in the spleen.  He also was noted to  have splenomegaly measuring 15 x 10 cm.  Also noted were multiple low  density lesions.  It was felt that they represented abscesses.   He subsequently was admitted.  He was seen by Dr. Orson Slick of general surgery.  He subsequently underwent a splenectomy.  The operative report shows the  spleen to be quite enlarged.  Apparently, there was part of the pancreas  that needed to be removed.  There was omentum that was adherent in the area  of  the splenic hilum.   Surprisingly enough, the biology report ([id number]) showed diffuse large cell  non-Hodgkin's lymphoma.  This was a B cell.  The immunophenotyping did not  show any obvious monoclonal population.  In addition, there was abundant  necrosis.   He has had absolutely no staging studies done.  His recovery has been  gradual.  He really has not had any temperatures since 07/07/2006.   We were asked to see him for our input.   PAST MEDICAL HISTORY:  Remarkable for the chronic dystonia of the head and  neck.  He has history of GERD.   ALLERGIES:  NONE.   ADMISSION MEDICATIONS:  1. Aspirin 81 mg p.o. every day  2. Nexium 20 mg every day.   SOCIAL HISTORY:  Negative for tobacco or alcohol use.  He has no obvious  occupational exposures.   FAMILY HISTORY:  Unremarkable.  REVIEW OF SYSTEMS:  As stated in history of present illness.   PHYSICAL EXAMINATION:  GENERAL:  This is a well-developed, well-nourished  white gentleman in no obvious distress.  VITAL SIGNS:  Temperature 98.  Pulse 80.  Respiratory rate 18.  Blood  pressure 112/72.  HEAD/NECK:  Normocephalic, atraumatic scalp.  He does have some obvious  dystonia in his neck.  There was no evidence of adenopathies noted in the  neck.  There is no supraclavicular lymph nodes.  LUNGS:  Clear bilaterally.  CARDIAC:  Regular rate and rhythm with a normal S1 and S2.  No murmurs, rubs  or bruits.  AXILLARY:  No bilateral axillary adenopathy.  ABDOMEN:  Healing laparotomy wound that extends horizontally.  There is  still some slight tenderness in the abdomen.  No inguinal adenopathy is  noted.  EXTREMITIES:  No clubbing, cyanosis or edema.  SKIN:  No rashes.  NEUROLOGICAL:  No focal neurological deficits.   LABORATORY STUDIES:  White blood cell count 14, hemoglobin 9.4, hematocrit  27.9, platelet count is 514.  His MCV is 90.  His sodium is 133, potassium  3.8, BUN 11, creatinine 0.8.  His LFTs are unremarkable.    IMPRESSION:  Mr. Howell is a 60 year old gentleman with diffuse large cell  lymphoma noted in the spleen.  This is certainly an unusual presentation for  large cell lymphoma.  On the other hand, the spleen is one big lymph node.   He needs to be staged appropriately.  He is going to need CAT scan and PET  scan.  He will also need a MUGA scan.  He will also need to have a bone  marrow biopsy done.   We can do all these as an outpatient.  He is anxious to get home.  I do not  see any reason why he needs to be in the hospital for these tests.   He certainly will require systemic chemotherapy.  He will require R-chop for  his chemotherapy.  The number of cycles will really be dependent on how what  we find with out staging studies.   I spoke with Mr. Futch and his wife.  They are both very nice people.  They both have a very strong faith.   He can be discharged tomorrow.  We will follow him up as an outpatient and  plan accordingly.      Rose Phi. Myna Hidalgo, M.D.  Electronically Signed     PRE/MEDQ  D:  07/09/2006  T:  07/10/2006  Job:  [id number]   cc:   Cliffton Asters, M.D.  Lebron Conners, M.D.  Leanne Chang, M.D.

## 2011-02-07 NOTE — Discharge Summary (Signed)
[name] [name], [name] [name]              ACCOUNT NO.:  [id number]   MEDICAL RECORD NO.:  [id number]          PATIENT TYPE:  INP   LOCATION:  5711                         FACILITY:  [facility]   PHYSICIAN:  Andres Shad. Rudean Curt, MD     DATE OF BIRTH:  [birth year]   DATE OF ADMISSION:  07/03/2006  DATE OF DISCHARGE:  07/10/2006                               DISCHARGE SUMMARY   FINAL DIAGNOSES:  1. Diffuse B-cell lymphoma.  2. Fever of unknown origin.  3. Splenic abscesses.  4. Diffuse dystonic spasms.   SUMMARY OF HOSPITALIZATION:  [name] [name] is 60 year old male with a  medical history remarkable for diffuse dystonic spasms, particularly in  the head and neck, that have resulted in vocal-cord dysfunction.  These  have been treated with botulinum injections in the vocal cords.  The  patient came to medical attention when he began experiencing daily  fevers from 99-100 degrees approximately 5 months ago.  This was  evaluated extensively as an outpatient, and was treated with a number of  courses of empiric antibiotics.  It was thought that due to the  patient's vocal-cord dysfunction, he was having microaspirations for  this reason, and a CT of the chest was done in September 2007 that did  not show any chest pathology, but did reveal a cystic lesion in the  spleen.  This is followed up by a CT scan of the abdomen and pelvis on  June 04, 2006.  The study was remarkable for splenomegaly with  numerous cystic masses in the spleen that were between 1 and 8 cm in  diameter.  These were thought to represent abscesses.  A CT-guided  aspirate was performed before admission that revealed moderate  neutrophils, but no organisms were seen on stains, and there was no  growth in any culture.  Following this procedure, the patient began to  have higher fevers between 102 and 103, including perfused sweating.   The patient was admitted on October 12th for further evaluation and for  an elective splenectomy.   This was performed by the [facility] surgery service  on October 14th with no complications.  The pathology results were  available on October 17th, and were consistent with the diffuse large B-  cell lymphoma.  The patient was informed of the diagnosis, and oncology  was consulted on October 18th.  Oncology undertook a staging workup of  his lymphoma, and recommended that he followup outside the hospital for  outpatient chemotherapy.  It was recommended that this begin  approximately 2 weeks following discharge when his surgical wound would  have healed more completely.  The patient was informed of his diagnosis,  and educated about the staging and treatment process.   FINAL DIAGNOSES AND PLAN:  1. Diffuse large B-cell lymphoma.  The patient will follow up in      approximately 2 weeks with the oncology service per their      discretion.  2. Postoperative pain.  The patient is doing quite well, and is only      requiring occasional Vicodin.  He will be discharged with Vicodin  for pain and Zofran for nausea and appetite.  3. Fever of unknown origin.  It is clear at this point that his      prolonged fevers were due to this lymphoma.  No further infectious      disease workup will be undertaken.  4. Status post splenectomy.  The patient was given pneumococcal,      Haemophilus influenzae type B and meningococcal vaccines      approximately 2 weeks prior to the splenectomy.  5. Diffuse dystonia.      Andres Shad. Rudean Curt, MD  Electronically Signed     PML/MEDQ  D:  07/10/2006  T:  07/11/2006  Job:  [id number]   cc:   Cliffton Asters, M.D.  Leanne Chang, M.D.  Rose Phi. Myna Hidalgo, M.D.

## 2011-07-08 ENCOUNTER — Encounter: Payer: Self-pay | Admitting: *Deleted

## 2011-07-31 ENCOUNTER — Other Ambulatory Visit: Payer: Self-pay | Admitting: Hematology & Oncology

## 2011-07-31 ENCOUNTER — Other Ambulatory Visit (HOSPITAL_BASED_OUTPATIENT_CLINIC_OR_DEPARTMENT_OTHER): Payer: BC Managed Care – PPO | Admitting: Lab

## 2011-07-31 ENCOUNTER — Ambulatory Visit (HOSPITAL_BASED_OUTPATIENT_CLINIC_OR_DEPARTMENT_OTHER): Payer: BC Managed Care – PPO | Admitting: Hematology & Oncology

## 2011-07-31 VITALS — BP 114/77 | HR 65 | Temp 97.8°F | Ht 66.0 in | Wt 172.0 lb

## 2011-07-31 DIAGNOSIS — C8589 Other specified types of non-Hodgkin lymphoma, extranodal and solid organ sites: Secondary | ICD-10-CM

## 2011-07-31 LAB — CBC WITH DIFFERENTIAL (CANCER CENTER ONLY)
BASO#: 0 10*3/uL (ref 0.0–0.2)
Eosinophils Absolute: 0.3 10*3/uL (ref 0.0–0.5)
HGB: 15.9 g/dL (ref 13.0–17.1)
LYMPH#: 3.3 10*3/uL (ref 0.9–3.3)
MCH: 33.1 pg (ref 28.0–33.4)
MONO#: 1 10*3/uL — ABNORMAL HIGH (ref 0.1–0.9)
MONO%: 11.7 % (ref 0.0–13.0)
NEUT#: 4.1 10*3/uL (ref 1.5–6.5)
Platelets: 279 10*3/uL (ref 145–400)
RBC: 4.81 10*6/uL (ref 4.20–5.70)
WBC: 8.8 10*3/uL (ref 4.0–10.0)

## 2011-07-31 LAB — COMPREHENSIVE METABOLIC PANEL
ALT: 26 U/L (ref 0–53)
AST: 24 U/L (ref 0–37)
Albumin: 4.5 g/dL (ref 3.5–5.2)
Alkaline Phosphatase: 57 U/L (ref 39–117)
BUN: 14 mg/dL (ref 6–23)
Calcium: 9.2 mg/dL (ref 8.4–10.5)
Chloride: 104 mEq/L (ref 96–112)
Potassium: 4.4 mEq/L (ref 3.5–5.3)
Sodium: 140 mEq/L (ref 135–145)
Total Protein: 7.1 g/dL (ref 6.0–8.3)

## 2011-07-31 NOTE — Progress Notes (Signed)
CSN: [id number] This office note has been dictated. ENNEVER,PETER R 07/31/2011

## 2011-07-31 NOTE — Progress Notes (Signed)
DIAGNOSIS:  Diffuse large cell non-Hodgkin lymphoma, clinical remission.  CURRENT THERAPY:  Observation.  INTERIM HISTORY:  [name] [name] comes in for followup.  Unfortunately he has been downsized.  He is no longer working for [employer].  He says he is okay with this.  He apparently has not had any problem health- wise.  He has had no problems with cough or shortness of breath.  He has had no abdominal pain.  He has had no fevers, sweats, or chills.  There has been no change in his medications.  He has had no change in bowel or bladder habits.  PHYSICAL EXAMINATION:  General:  This is a well-developed, well- nourished white gentleman in no obvious distress.  Vital Signs:  Show a temperature of 97.8, pulse 65, respiratory rate 18, blood pressure 114/77, weight is 172.  Head and Neck Exam:  Shows a normocephalic, atraumatic skull.  There are no ocular or oral lesions.  There are no palpable cervical or supraclavicular lymph nodes.  Lungs:  Clear to percussion and auscultation bilaterally.  Cardiac Exam:  Regular rate and rhythm with a normal S1 and S2.  There are no murmurs, rubs, or bruits.  Abdominal Exam:  Soft with good bowel sounds.  There is a well- healed laparotomy scar from his splenectomy.  There is no fluid wave. There is no palpable hepatomegaly.  Back Exam:  Shows some slight kyphosis.  This is chronic.  Extremities:  Show no clubbing, cyanosis, or edema.  Neurological Exam:  Shows the torticollis, which is stable.  LABORATORY STUDIES:  White cell count is 8.8, hemoglobin 16, hematocrit 44.7, platelet count 279.  IMPRESSION:  Mr. Tassin is a 60 year old gentleman with a history of diffuse large cell non-Hodgkin lymphoma.  He completed all of this treatment, including maintenance Rituxan in October 2008.  I really think that we can get him back 1 more time.  I think that we can probably let him go after we see him back next year.  I think that his risk for  recurrence of his lymphoma is going to be easily less than 10%.  We will continue to pray for him and his wife.  She is having her own health issues with respect to muscular dystrophy, but seems to be managing.    ______________________________ Josph Macho, M.D. PRE/MEDQ  D:  07/31/2011  T:  07/31/2011  Job:  [id number]

## 2011-08-01 LAB — COMPREHENSIVE METABOLIC PANEL
AST: 24 U/L (ref 0–37)
Albumin: 4.5 g/dL (ref 3.5–5.2)
Alkaline Phosphatase: 57 U/L (ref 39–117)
BUN: 14 mg/dL (ref 6–23)
Creatinine, Ser: 1.01 mg/dL (ref 0.50–1.35)
Glucose, Bld: 92 mg/dL (ref 70–99)
Potassium: 4.4 mEq/L (ref 3.5–5.3)
Total Bilirubin: 0.5 mg/dL (ref 0.3–1.2)

## 2011-08-01 LAB — VITAMIN D 25 HYDROXY (VIT D DEFICIENCY, FRACTURES): Vit D, 25-Hydroxy: 43 ng/mL (ref 30–89)

## 2011-11-21 ENCOUNTER — Ambulatory Visit (INDEPENDENT_AMBULATORY_CARE_PROVIDER_SITE_OTHER)
Admission: RE | Admit: 2011-11-21 | Discharge: 2011-11-21 | Disposition: A | Discharge status: Home / Self Care | Payer: BC Managed Care – PPO | Source: Ambulatory Visit | Attending: Internal Medicine | Admitting: Internal Medicine

## 2011-11-21 ENCOUNTER — Ambulatory Visit (INDEPENDENT_AMBULATORY_CARE_PROVIDER_SITE_OTHER): Payer: BC Managed Care – PPO | Admitting: Internal Medicine

## 2011-11-21 ENCOUNTER — Encounter: Payer: Self-pay | Admitting: Internal Medicine

## 2011-11-21 VITALS — BP 116/74 | Temp 98.3°F | Ht 66.0 in | Wt 178.0 lb

## 2011-11-21 DIAGNOSIS — M545 Low back pain, unspecified: Secondary | ICD-10-CM

## 2011-11-21 HISTORY — DX: Low back pain, unspecified: M54.50

## 2011-11-21 MED ORDER — TRAMADOL HCL 50 MG PO TABS: 50.0000 mg | ORAL_TABLET | Freq: Three times a day (TID) | ORAL | Status: AC | PRN

## 2011-11-21 NOTE — Progress Notes (Signed)
  Subjective:    Patient ID: [name] [name], male    DOB: 1950/12/28, 61 y.o.   MRN: [id number]  Back Pain This is a new problem. The current episode started in the past 7 days. The problem occurs intermittently. The problem has been gradually worsening since onset. The pain is present in the lumbar spine. The quality of the pain is described as aching. The pain does not radiate. The pain is moderate. The pain is worse during the night. The symptoms are aggravated by bending and twisting. Pertinent negatives include no dysuria, fever, leg pain, numbness or weakness. Risk factors include history of cancer. He has tried NSAIDs for the symptoms. The treatment provided mild relief.      Review of Systems  Constitutional: Negative for fever.  Genitourinary: Negative for dysuria.  Musculoskeletal: Positive for back pain.  Neurological: Negative for weakness and numbness.   Past Medical History  Diagnosis Date  . Dystonia 1994    diagnosed in [place]  . Non Hodgkin's lymphoma     diffuse- 6 cycles of chemo with R CHOP Rituxan - last dose 10/08  . Cervical lymphadenopathy     right - followed my ENT    History   Social History  . Marital Status: Married    Spouse Name: N/A    Number of Children: N/A  . Years of Education: N/A   Occupational History  . Not on file.   Social History Main Topics  . Smoking status: Never Smoker   . Smokeless tobacco: Not on file  . Alcohol Use: No  . Drug Use:   . Sexually Active:    Other Topics Concern  . Not on file   Social History Narrative  . No narrative on file    Past Surgical History  Procedure Date  . Splenectomy     Family History  Problem Relation Age of Onset  . Heart disease Mother   . Cancer Mother     lung  . Heart disease Father     No Known Allergies  Current Outpatient Prescriptions on File Prior to Visit  Medication Sig Dispense Refill  . cholecalciferol (VITAMIN D) 1000 UNITS tablet Take 1,000 Units by  mouth daily.          BP 116/74  Temp(Src) 98.3 F (36.8 C) (Oral)  Ht 5\' 6"  (1.676 m)  Wt 178 lb (80.74 kg)  BMI 28.73 kg/m2       Objective:   Physical Exam  Constitutional: He is oriented to person, place, and time. He appears well-developed and well-nourished.  Cardiovascular: Normal rate, regular rhythm and normal heart sounds.   No murmur heard. Pulmonary/Chest: Effort normal and breath sounds normal. He has no wheezes. He has no rales.  Abdominal: Soft. Bowel sounds are normal. There is no tenderness.  Musculoskeletal:       Mild spasm of right para spinal muscles.  Negative straight leg raise test.  Able to toe and heel walk w/o difficulty.  Able to squat   Neurological: He is oriented to person, place, and time. He has normal reflexes. He displays normal reflexes. No cranial nerve deficit. He exhibits normal muscle tone. Coordination normal.          Assessment & Plan:

## 2011-11-21 NOTE — Patient Instructions (Signed)
Please call our office if your symptoms do not improve or gets worse.  

## 2011-11-21 NOTE — Assessment & Plan Note (Signed)
I suspect lumbar strain.  Use tramadol and rest as directed.  Considering hx of non-Hodgkin's lymphoma, obtain lumbar x-ray. Reassess is 2 weeks.

## 2011-12-05 ENCOUNTER — Encounter: Payer: Self-pay | Admitting: Internal Medicine

## 2011-12-05 ENCOUNTER — Ambulatory Visit (INDEPENDENT_AMBULATORY_CARE_PROVIDER_SITE_OTHER): Payer: BC Managed Care – PPO | Admitting: Internal Medicine

## 2011-12-05 VITALS — BP 102/64 | Temp 98.1°F | Wt 177.0 lb

## 2011-12-05 DIAGNOSIS — M545 Low back pain: Secondary | ICD-10-CM

## 2011-12-05 NOTE — Assessment & Plan Note (Signed)
Improved.  Lumbar x ray showed - No acute finding. Degenerative disc disease L4-5.  I urged regular stretching and core strengthening exercises.

## 2011-12-05 NOTE — Patient Instructions (Signed)
Perform back and abdominal exercises as directed

## 2011-12-05 NOTE — Progress Notes (Signed)
  Subjective:    Patient ID: [name] [name], male    DOB: November 07, 1950, 61 y.o.   MRN: [id number]  HPI  61 year old white male for followup regarding low back pain. Since previous visit the patient reports back pain has significantly improved. He has been using tramadol as needed. His lumbar x-ray was negative for acute findings but notable for DDD between L4-L5. He has started exercise program.   Review of Systems Negative for weakness  Past Medical History  Diagnosis Date  . Dystonia 1994    diagnosed in [place]  . Non Hodgkin's lymphoma     diffuse- 6 cycles of chemo with R CHOP Rituxan - last dose 10/08  . Cervical lymphadenopathy     right - followed my ENT    History   Social History  . Marital Status: Married    Spouse Name: N/A    Number of Children: N/A  . Years of Education: N/A   Occupational History  . Not on file.   Social History Main Topics  . Smoking status: Never Smoker   . Smokeless tobacco: Not on file  . Alcohol Use: No  . Drug Use:   . Sexually Active:    Other Topics Concern  . Not on file   Social History Narrative  . No narrative on file    Past Surgical History  Procedure Date  . Splenectomy     Family History  Problem Relation Age of Onset  . Heart disease Mother   . Cancer Mother     lung  . Heart disease Father     No Known Allergies  Current Outpatient Prescriptions on File Prior to Visit  Medication Sig Dispense Refill  . aspirin 81 MG chewable tablet Chew 81 mg by mouth daily.      . cholecalciferol (VITAMIN D) 1000 UNITS tablet Take 1,000 Units by mouth daily.          BP 102/64  Temp(Src) 98.1 F (36.7 C) (Oral)  Wt 177 lb (80.287 kg)       Objective:   Physical Exam  Constitutional: He is oriented to person, place, and time. He appears well-developed and well-nourished.  Cardiovascular: Normal rate, regular rhythm and normal heart sounds.   Pulmonary/Chest: Effort normal and breath sounds normal.    Neurological: He is alert and oriented to person, place, and time.       Assessment & Plan:

## 2012-04-29 ENCOUNTER — Ambulatory Visit: Payer: BC Managed Care – PPO | Admitting: Hematology & Oncology

## 2012-04-29 ENCOUNTER — Other Ambulatory Visit: Payer: BC Managed Care – PPO | Admitting: Lab

## 2012-04-30 ENCOUNTER — Telehealth: Payer: Self-pay | Admitting: Hematology & Oncology

## 2012-04-30 NOTE — Telephone Encounter (Signed)
Patient's wife called and cx 05/13/12 appt and resch for 05/20/12

## 2012-05-13 ENCOUNTER — Other Ambulatory Visit: Payer: BC Managed Care – PPO | Admitting: Lab

## 2012-05-13 ENCOUNTER — Ambulatory Visit: Payer: BC Managed Care – PPO | Admitting: Hematology & Oncology

## 2012-05-20 ENCOUNTER — Other Ambulatory Visit (HOSPITAL_BASED_OUTPATIENT_CLINIC_OR_DEPARTMENT_OTHER): Payer: BC Managed Care – PPO | Admitting: Lab

## 2012-05-20 ENCOUNTER — Ambulatory Visit (HOSPITAL_BASED_OUTPATIENT_CLINIC_OR_DEPARTMENT_OTHER): Payer: BC Managed Care – PPO | Admitting: Hematology & Oncology

## 2012-05-20 VITALS — BP 119/66 | HR 68 | Temp 97.8°F | Resp 20 | Ht 66.0 in | Wt 176.0 lb

## 2012-05-20 DIAGNOSIS — C8589 Other specified types of non-Hodgkin lymphoma, extranodal and solid organ sites: Secondary | ICD-10-CM

## 2012-05-20 DIAGNOSIS — C8587 Other specified types of non-Hodgkin lymphoma, spleen: Secondary | ICD-10-CM

## 2012-05-20 DIAGNOSIS — C8590 Non-Hodgkin lymphoma, unspecified, unspecified site: Secondary | ICD-10-CM

## 2012-05-20 LAB — CBC WITH DIFFERENTIAL (CANCER CENTER ONLY)
BASO%: 0.3 % (ref 0.0–2.0)
Eosinophils Absolute: 0.4 10*3/uL (ref 0.0–0.5)
HCT: 45.1 % (ref 38.7–49.9)
LYMPH#: 4 10*3/uL — ABNORMAL HIGH (ref 0.9–3.3)
MONO#: 1 10*3/uL — ABNORMAL HIGH (ref 0.1–0.9)
Platelets: 289 10*3/uL (ref 145–400)
RBC: 4.77 10*6/uL (ref 4.20–5.70)
RDW: 14.9 % (ref 11.1–15.7)
WBC: 9.4 10*3/uL (ref 4.0–10.0)

## 2012-05-20 NOTE — Progress Notes (Signed)
This office note has been dictated.

## 2012-05-21 ENCOUNTER — Telehealth: Payer: Self-pay | Admitting: *Deleted

## 2012-05-21 LAB — COMPREHENSIVE METABOLIC PANEL
ALT: 26 U/L (ref 0–53)
Albumin: 4.3 g/dL (ref 3.5–5.2)
CO2: 28 mEq/L (ref 19–32)
Calcium: 9.2 mg/dL (ref 8.4–10.5)
Chloride: 105 mEq/L (ref 96–112)
Glucose, Bld: 93 mg/dL (ref 70–99)
Potassium: 4.3 mEq/L (ref 3.5–5.3)
Sodium: 140 mEq/L (ref 135–145)
Total Protein: 6.8 g/dL (ref 6.0–8.3)

## 2012-05-21 LAB — VITAMIN D 25 HYDROXY (VIT D DEFICIENCY, FRACTURES): Vit D, 25-Hydroxy: 43 ng/mL (ref 30–89)

## 2012-05-21 NOTE — Telephone Encounter (Signed)
Called patient to let him know that his labs look great per dr. Myna Hidalgo.

## 2012-05-21 NOTE — Progress Notes (Signed)
CC:   Barbette Hair. Artist Pais, DO  DIAGNOSIS:  Diffuse large-cell non-Hodgkin lymphoma, clinical remission.  CURRENT THERAPY:  Observation.  INTERIM HISTORY:  [name] [name] comes in for his followup.  He is doing well.  We last saw him back in November.  Since then, he has been doing okay.  His daughter has moved back from [place].  He and his wife are happy about this.  He has had no fevers, sweats or chills.  He has had no problems with cough or shortness breath.  He has had no bleeding.  There have been no rashes.  He has had no change in medications.  He continues on his aspirin and vitamin D.  PHYSICAL EXAMINATION:  This is a well-developed, well-nourished white gentleman in no obvious distress.  Vital signs: 97.8, pulse 68, respiratory rate 20, blood pressure 119/66.  Weight is 176.  Head and neck:  Normocephalic, atraumatic skull.  There are no ocular or oral lesions.  There are no palpable cervical or supraclavicular lymph nodes. Lungs:  Clear bilaterally.  Cardiac:  Regular rate and rhythm with a normal S1 and S2.  There are no murmurs or rubs, or bruits.  Abdomen: Soft with good bowel sounds.  There is no palpable abdominal mass.  He has no fluid wave.  There is no palpable hepatomegaly.  He has a well- healed splenectomy scar.  Back:  No tenderness over the spine, ribs, or hips.  Extremities:  No clubbing, cyanosis or edema.  Skin:  A large sebaceous cyst in the left upper back.  LABORATORY STUDIES:  White cell count is 9.4, hemoglobin 15.8, hematocrit 45.1, platelet count 289.  IMPRESSION:  [name] [name] is a 61 year old gentleman with a large-cell non-Hodgkin lymphoma.  He is in remission from this.  He has now been in remission for 5 years.  He did have a "maintenance" Rituxan after his chemotherapy.  I do not do not see any evidence of recurrence for him.  We will go ahead and let him go from the practice.  Again, I think that he is cured.  I just do not think that we  are going to impact his medical care at this point in time.  We will be more than happy to see him back if necessary.   ______________________________ Josph Macho, M.D. PRE/MEDQ  D:  05/20/2012  T:  05/21/2012  Job:  [id number]

## 2012-05-21 NOTE — Telephone Encounter (Signed)
Message copied by Anselm Jungling on Fri May 21, 2012 10:35 AM ------      Message from: Arlan Organ R      Created: Thu May 20, 2012  5:59 PM       Please call to let him know that his labs look great. Have a great year. You have done so well. Thanks. Cindee Lame

## 2014-07-10 ENCOUNTER — Ambulatory Visit (INDEPENDENT_AMBULATORY_CARE_PROVIDER_SITE_OTHER): Payer: BC Managed Care – PPO | Admitting: Internal Medicine

## 2014-07-10 ENCOUNTER — Encounter: Payer: Self-pay | Admitting: Internal Medicine

## 2014-07-10 ENCOUNTER — Encounter: Payer: Self-pay | Admitting: Neurology

## 2014-07-10 VITALS — BP 130/80 | HR 80 | Temp 98.7°F | Resp 20 | Ht 66.0 in | Wt 178.0 lb

## 2014-07-10 DIAGNOSIS — L723 Sebaceous cyst: Secondary | ICD-10-CM

## 2014-07-10 DIAGNOSIS — G241 Genetic torsion dystonia: Secondary | ICD-10-CM

## 2014-07-10 DIAGNOSIS — G249 Dystonia, unspecified: Secondary | ICD-10-CM | POA: Insufficient documentation

## 2014-07-10 DIAGNOSIS — L089 Local infection of the skin and subcutaneous tissue, unspecified: Secondary | ICD-10-CM

## 2014-07-10 DIAGNOSIS — Z23 Encounter for immunization: Secondary | ICD-10-CM

## 2014-07-10 NOTE — Progress Notes (Signed)
Subjective:    Patient ID: [name] [name], male    DOB: 1951/06/16, 63 y.o.   MRN: [id number]  HPI 63 year old patient, who presents with 2 concerns.  He has a long history of dystonia present since 1994.  This has been fairly generalized with vocal tremor blepharospasm, cervical and oral mandibular manifestations.  He has been seen by ENT and neurology in [place] and has received Botox injections.  In May of this year, he noted resting tremor involving his right hand and more recently some left hand involvement.   His other complaint is a mass involving his left upper back area.  This has been slowly growing for a number of years and more recently has been red and painful  Past Medical History  Diagnosis Date  . Dystonia 1994    diagnosed in [place]  . Non Hodgkin's lymphoma     diffuse- 6 cycles of chemo with R CHOP Rituxan - last dose 10/08  . Cervical lymphadenopathy     right - followed my ENT    History   Social History  . Marital Status: Married    Spouse Name: N/A    Number of Children: N/A  . Years of Education: N/A   Occupational History  . Not on file.   Social History Main Topics  . Smoking status: Never Smoker   . Smokeless tobacco: Not on file  . Alcohol Use: No  . Drug Use:   . Sexual Activity:    Other Topics Concern  . Not on file   Social History Narrative  . No narrative on file    Past Surgical History  Procedure Laterality Date  . Splenectomy      Family History  Problem Relation Age of Onset  . Heart disease Mother   . Cancer Mother     lung  . Heart disease Father     No Known Allergies  Current Outpatient Prescriptions on File Prior to Visit  Medication Sig Dispense Refill  . aspirin 81 MG chewable tablet Chew 81 mg by mouth daily.      . cholecalciferol (VITAMIN D) 1000 UNITS tablet Take 1,000 Units by mouth daily.         No current facility-administered medications on file prior to visit.    BP 130/80  Pulse 80   Temp(Src) 98.7 F (37.1 C) (Oral)  Resp 20  Ht 5\' 6"  (1.676 m)  Wt 178 lb (80.74 kg)  BMI 28.74 kg/m2  SpO2 96%      Review of Systems  Constitutional: Negative for fever, chills, appetite change and fatigue.  HENT: Negative for congestion, dental problem, ear pain, hearing loss, sore throat, tinnitus, trouble swallowing and voice change.   Eyes: Negative for pain, discharge and visual disturbance.  Respiratory: Negative for cough, chest tightness, wheezing and stridor.   Cardiovascular: Negative for chest pain, palpitations and leg swelling.  Gastrointestinal: Negative for nausea, vomiting, abdominal pain, diarrhea, constipation, blood in stool and abdominal distention.  Genitourinary: Negative for urgency, hematuria, flank pain, discharge, difficulty urinating and genital sores.  Musculoskeletal: Negative for arthralgias, back pain, gait problem, joint swelling, myalgias and neck stiffness.  Skin: Positive for wound. Negative for rash.  Neurological: Positive for tremors. Negative for dizziness, syncope, speech difficulty, weakness, numbness and headaches.  Hematological: Negative for adenopathy. Does not bruise/bleed easily.  Psychiatric/Behavioral: Negative for behavioral problems and dysphoric mood. The patient is not nervous/anxious.        Objective:   Physical Exam  Constitutional: He appears well-developed and well-nourished. No distress.  Neurological:  Resting tremor involving the right hand Speaks with a very soft voice Oral dyskinesia noted    Skin:  Large 5 cm fluctuant infected cyst involving the left upper back area          Assessment & Plan:   Dystonia-Parkinsonian syndrome.  Patient has a history of fairly generalized dystonia since 1994 and Parkinsonian features.  Over the past several months.  We'll schedule a neurology evaluation  Infected sebaceous cyst left upper back  Procedure note.  After verbal consent and local anesthesia with 1%  Xylocaine, a 1 cm incision was made in the central aspect of the cyst and the entire cystic contents drained.  Local antibiotic ointment applied and the wound dressed.  Local wound care discussed

## 2014-07-10 NOTE — Patient Instructions (Signed)
Followup with neurology as discussed  Epidermal Cyst An epidermal cyst is sometimes called a sebaceous cyst, epidermal inclusion cyst, or infundibular cyst. These cysts usually contain a substance that looks "pasty" or "cheesy" and may have a bad smell. This substance is a protein called keratin. Epidermal cysts are usually found on the face, neck, or trunk. They may also occur in the vaginal area or other parts of the genitalia of both men and women. Epidermal cysts are usually small, painless, slow-growing bumps or lumps that move freely under the skin. It is important not to try to pop them. This may cause an infection and lead to tenderness and swelling. CAUSES  Epidermal cysts may be caused by a deep penetrating injury to the skin or a plugged hair follicle, often associated with acne. SYMPTOMS  Epidermal cysts can become inflamed and cause:  Redness.  Tenderness.  Increased temperature of the skin over the bumps or lumps.  Grayish-white, bad smelling material that drains from the bump or lump. DIAGNOSIS  Epidermal cysts are easily diagnosed by your caregiver during an exam. Rarely, a tissue sample (biopsy) may be taken to rule out other conditions that may resemble epidermal cysts. TREATMENT   Epidermal cysts often get better and disappear on their own. They are rarely ever cancerous.  If a cyst becomes infected, it may become inflamed and tender. This may require opening and draining the cyst. Treatment with antibiotics may be necessary. When the infection is gone, the cyst may be removed with minor surgery.  Small, inflamed cysts can often be treated with antibiotics or by injecting steroid medicines.  Sometimes, epidermal cysts become large and bothersome. If this happens, surgical removal in your caregiver's office may be necessary. HOME CARE INSTRUCTIONS  Only take over-the-counter or prescription medicines as directed by your caregiver.  Take your antibiotics as directed.  Finish them even if you start to feel better. SEEK MEDICAL CARE IF:   Your cyst becomes tender, red, or swollen.  Your condition is not improving or is getting worse.  You have any other questions or concerns. MAKE SURE YOU:  Understand these instructions.  Will watch your condition.  Will get help right away if you are not doing well or get worse. Document Released: 08/09/2004 Document Revised: 12/01/2011 Document Reviewed: 03/17/2011 [facility] Patient Information 2015 [place], [place]. This information is not intended to replace advice given to you by your health care provider. Make sure you discuss any questions you have with your health care provider.

## 2014-07-10 NOTE — Progress Notes (Signed)
Pre visit review using our clinic review tool, if applicable. No additional management support is needed unless otherwise documented below in the visit note. 

## 2014-07-26 ENCOUNTER — Ambulatory Visit: Payer: BC Managed Care – PPO | Admitting: Neurology

## 2014-07-27 ENCOUNTER — Ambulatory Visit (INDEPENDENT_AMBULATORY_CARE_PROVIDER_SITE_OTHER): Payer: BC Managed Care – PPO | Admitting: Neurology

## 2014-07-27 ENCOUNTER — Encounter: Payer: Self-pay | Admitting: Neurology

## 2014-07-27 VITALS — BP 100/70 | HR 68 | Ht 66.0 in | Wt 174.0 lb

## 2014-07-27 DIAGNOSIS — G20A1 Parkinson's disease without dyskinesia, without mention of fluctuations: Secondary | ICD-10-CM

## 2014-07-27 DIAGNOSIS — G244 Idiopathic orofacial dystonia: Secondary | ICD-10-CM

## 2014-07-27 DIAGNOSIS — R1319 Other dysphagia: Secondary | ICD-10-CM

## 2014-07-27 DIAGNOSIS — G20 Parkinson's disease: Secondary | ICD-10-CM

## 2014-07-27 DIAGNOSIS — F458 Other somatoform disorders: Secondary | ICD-10-CM

## 2014-07-27 HISTORY — DX: Other dysphagia: R13.19

## 2014-07-27 HISTORY — DX: Parkinson's disease: G20

## 2014-07-27 HISTORY — DX: Parkinson's disease without dyskinesia, without mention of fluctuations: G20.A1

## 2014-07-27 HISTORY — DX: Idiopathic orofacial dystonia: G24.4

## 2014-07-27 MED ORDER — PRAMIPEXOLE DIHYDROCHLORIDE ER 1.5 MG PO TB24: 1.5000 mg | ORAL_TABLET | Freq: Every day | ORAL | Status: DC

## 2014-07-27 MED ORDER — PRAMIPEXOLE DIHYDROCHLORIDE ER 0.375 MG PO TB24: ORAL_TABLET | ORAL | Status: DC

## 2014-07-27 NOTE — Patient Instructions (Addendum)
1. You will take 1 tablet daily for one week of Mirapex 0.375 mg. You will then take 1 tablet twice daily for one week. You will then switch to the Mirapex 1.5 mg tablets. You will take this 1 tablet once daily.  2. [name] [name] will call you to schedule your modified barium swallow. If you do not hear from them, they can be reached at [phone].

## 2014-07-27 NOTE — Progress Notes (Signed)
[name] [name] was seen today in the movement disorders clinic for neurologic consultation at the request of [name], DO.  The consultation is for the evaluation of tremor.  This patient is accompanied in the office by his spouse who supplements the history.    The pt was dx with dystonia in [place] in 1994.  He presented to the speech pathologist and was dx with spasmodic dysphonia.  He was then referred to the neurologist who confirmed the diagnosis and did and MRI of the brain that was normal.  He had botox in to the vocal cords and that was helpful.  He would lose the voice for 6 weeks after the injections and then would get the voice back for 6 weeks.  He would go back to [place] twice per year for the injections.  He hasn't had the injections for the last 2 years as he quit working.  The spasmodic dysphonia is worse on the phone.  Last ST was 1995.  Having some trouble with swallowing.  Had developed some movements in the face and tried botox there but didn't really help there.  Has had some neck extension as well with the dystonic movements.  Wife states that these sx's have been better since retirement; he is under less stress.  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  The patient reports that [name] tremor started in the thumb in may and has slowly gotten worse and 3 weeks ago, he noted just a little in the [name].  He notes the tremor most at rest.  Specific Symptoms:  Tremor: Yes.   Voice: as above Sleep: sleeps well  Vivid Dreams:  Yes.    Acting out dreams:  No. Wet Pillows: No. Postural symptoms:  No.  Falls?  No. Bradykinesia symptoms: no bradykinesia noted Loss of smell:  No. Loss of taste:  No. Urinary Incontinence:  No. Difficulty Swallowing:  Yes.   Handwriting, micrographia: No. Trouble with ADL's:  No.  Trouble buttoning clothing: No. Depression:  No. Memory changes:  No. Hallucinations:  No.  visual distortions: No. N/V:  No. Lightheaded:  No.  Syncope:  No. Diplopia:  No. Dyskinesia:  No.  Neuroimaging has previously been performed.  It is not available for my review today.    ALLERGIES:  No Known Allergies  CURRENT MEDICATIONS:  Outpatient Encounter Prescriptions as of 07/27/2014  Medication Sig  . aspirin 81 MG chewable tablet Chew 81 mg by mouth daily.  . cholecalciferol (VITAMIN D) 1000 UNITS tablet Take 1,000 Units by mouth daily.    Marland Kitchen pyridOXINE (VITAMIN B-6) 100 MG tablet Take 100 mg by mouth daily.    PAST MEDICAL HISTORY:   Past Medical History  Diagnosis Date  . Dystonia 1994    diagnosed in [place]  . Non Hodgkin's lymphoma     diffuse- 6 cycles of chemo with R CHOP Rituxan - last dose 10/08  . Cervical lymphadenopathy     right - followed my ENT    PAST SURGICAL HISTORY:   Past Surgical History  Procedure Laterality Date  . Splenectomy    . Cholecystectomy, laparoscopic    . Port-a-cath removal    . Portacath placement    . Tonsillectomy    . Eye surgery      x2 as child    SOCIAL HISTORY:   History   Social History  . Marital Status: Married    Spouse Name: N/A    Number of Children: N/A  .  Years of Education: N/A   Occupational History  . Not on file.   Social History Main Topics  . Smoking status: Never Smoker   . Smokeless tobacco: Not on file  . Alcohol Use: No  . Drug Use: No  . Sexual Activity: Not on file   Other Topics Concern  . Not on file   Social History Narrative    FAMILY HISTORY:   Family Status  Relation Status Death Age  . Mother Deceased     heart disease, lung cancer  . Father Deceased     heart attack  . Brother Deceased     colon cancer  . Sister Alive     unknown  . Son Alive     anemia  . Son Alive     healthy  . Daughter Alive     healthy  . Son Deceased     debrancher's enzyme disease  . Son Deceased     debrancher's enzyme disease    ROS:  A complete 10 system review of systems was obtained and was unremarkable apart from what is mentioned  above.  PHYSICAL EXAMINATION:    VITALS:   Filed Vitals:   07/27/14 0750  BP: 100/70  Pulse: 68  Height: 5\' 6"  (1.676 m)  Weight: 174 lb (78.926 kg)    GEN:  The patient appears stated age and is in NAD. HEENT:  Normocephalic, atraumatic.  The mucous membranes are moist. The superficial temporal arteries are without ropiness or tenderness. CV:  RRR Lungs:  CTAB Neck/HEME:  There are no carotid bruits bilaterally.  Neurological examination:  Orientation: The patient is alert and oriented x3. Fund of knowledge is appropriate.  Recent and remote memory are intact.  Attention and concentration are normal.    Able to name objects and repeat phrases. Cranial nerves: There is good facial symmetry.  Slight facial hypomimia.  Mild oromandibular dystonia. Pupils are equal round and reactive to light bilaterally. Fundoscopic exam reveals clear margins bilaterally. There is L exotropia (chronic). The visual fields are full to confrontational testing. The speech bulbar in quality and signifcant spasmodic dysphonia.   Soft palate rises symmetrically and there is no tongue deviation. Hearing is intact to conversational tone. Sensation: Sensation is intact to light and pinprick throughout (facial, trunk, extremities). Vibration is intact at the bilateral big toe. There is no extinction with double simultaneous stimulation. There is no sensory dermatomal level identified. Motor: Strength is 5/5 in the bilateral upper and lower extremities.   Shoulder shrug is equal and symmetric.  There is no pronator drift. Deep tendon reflexes: Deep tendon reflexes are 2/4 at the bilateral biceps, triceps, brachioradialis, patella and achilles. Plantar responses are downgoing bilaterally.  Movement examination: Tone: There is normal tone in the bilateral upper extremities.  The tone in the lower extremities is normal.  Abnormal movements: There is mod, near constant [name] resting tremor that increases with ambulation and  distraction Coordination:  There is minimal decremation with RAM's, seen only with finger taps and seen bilaterally.  All other forms of RAM's, including alternating supination and pronation of the forearm, hand opening and closing, heel taps, toe taps were normal Gait and Station: The patient has no difficulty arising out of a deep-seated chair without the use of the hands. The patient's stride length is normal.  The patient has a negative pull test.      ASSESSMENT/PLAN:  1.  Parkinsonism  -Pt likely has early PD, but doesn't meet all criteria  yet according to the brain bank criteria, but likely does have early PD. Even though he has prominent facial dystonia (meige syndrome), I don't think that this is related as this started in 1994.  He does not have dystonia parkinsonism, as this is only in patients of filipino descent.  I had a lengthy discussion with the patient today.  Much good than 50% of the 80 min visit was spent in counseling.  We discussed the importance of exercise.  Talked about various treatment options.  I explained that medications do not change progression of disease.  He would like to try medication, however, to see if it would help with the tremor.  Decided on Mirapex ER and will work up to 1.5 mg daily.  Discussed risk of sleep attacks and compulsive behaviors.  Risks, benefits, side effects and alternative therapies were discussed.  The opportunity to ask questions was given and they were answered to the best of my ability.  The patient expressed understanding and willingness to follow the outlined treatment protocols.  -will do MRI brain given hx of non-Hodgkin's lymphoma.  -discussed community resources.  Given information on the community support group. 2.  Dysphagia  -primarily related to Meige syndrome.  -will schedule MBE 3.  Follow-up in the next few months, sooner should new neurologic issues arise.

## 2014-07-31 ENCOUNTER — Telehealth: Payer: Self-pay | Admitting: Neurology

## 2014-07-31 DIAGNOSIS — R251 Tremor, unspecified: Secondary | ICD-10-CM

## 2014-07-31 DIAGNOSIS — Z8572 Personal history of non-Hodgkin lymphomas: Secondary | ICD-10-CM

## 2014-07-31 NOTE — Telephone Encounter (Signed)
-----   Message from [place], DO sent at 07/27/2014  4:12 PM EST ----- Forgot to tell patient that, if he has no objection, I want to do MRI brain with and without, before next visit.  If agreeable, [name] [name] dx is tremor and hx of non-hodgkin lymphoma

## 2014-07-31 NOTE — Telephone Encounter (Signed)
Left message on machine for patient to call back.

## 2014-08-03 ENCOUNTER — Other Ambulatory Visit (HOSPITAL_COMMUNITY): Payer: Self-pay | Admitting: Neurology

## 2014-08-03 DIAGNOSIS — R1310 Dysphagia, unspecified: Secondary | ICD-10-CM

## 2014-08-03 NOTE — Telephone Encounter (Signed)
Spoke with patient and he agreed to MR brain. Set up for 08/24/2014. Patient made aware. Orders placed for labs - patient to come early to have drawn.

## 2014-08-07 ENCOUNTER — Ambulatory Visit (HOSPITAL_COMMUNITY)
Admission: RE | Admit: 2014-08-07 | Discharge: 2014-08-07 | Disposition: A | Discharge status: Home / Self Care | Payer: BC Managed Care – PPO | Source: Ambulatory Visit | Attending: Neurology | Admitting: Neurology

## 2014-08-07 DIAGNOSIS — R1310 Dysphagia, unspecified: Secondary | ICD-10-CM

## 2014-08-07 DIAGNOSIS — G20 Parkinson's disease: Secondary | ICD-10-CM | POA: Diagnosis not present

## 2014-08-07 DIAGNOSIS — R1311 Dysphagia, oral phase: Secondary | ICD-10-CM | POA: Diagnosis not present

## 2014-08-07 DIAGNOSIS — Z9221 Personal history of antineoplastic chemotherapy: Secondary | ICD-10-CM | POA: Diagnosis not present

## 2014-08-07 DIAGNOSIS — R1313 Dysphagia, pharyngeal phase: Secondary | ICD-10-CM | POA: Diagnosis not present

## 2014-08-07 DIAGNOSIS — Z8572 Personal history of non-Hodgkin lymphomas: Secondary | ICD-10-CM | POA: Insufficient documentation

## 2014-08-07 DIAGNOSIS — G249 Dystonia, unspecified: Secondary | ICD-10-CM | POA: Insufficient documentation

## 2014-08-07 DIAGNOSIS — Z9049 Acquired absence of other specified parts of digestive tract: Secondary | ICD-10-CM | POA: Insufficient documentation

## 2014-08-07 DIAGNOSIS — Z9081 Acquired absence of spleen: Secondary | ICD-10-CM | POA: Insufficient documentation

## 2014-08-07 DIAGNOSIS — R1319 Other dysphagia: Secondary | ICD-10-CM

## 2014-08-07 DIAGNOSIS — G244 Idiopathic orofacial dystonia: Secondary | ICD-10-CM

## 2014-08-07 NOTE — Procedures (Signed)
Objective Swallowing Evaluation: Modified Barium Swallowing Study  Patient Details  Name: [name] [name] MRN: [id number] Date of Birth: 1950-11-03  Today's Date: 08/07/2014 Time: 1250-1335 SLP Time Calculation (min) (ACUTE ONLY): 45 min  Past Medical History:  Past Medical History  Diagnosis Date  . Dystonia 1994    diagnosed in [place]  . Non Hodgkin's lymphoma     diffuse- 6 cycles of chemo with R CHOP Rituxan - last dose 10/08  . Cervical lymphadenopathy     right - followed my ENT   Past Surgical History:  Past Surgical History  Procedure Laterality Date  . Splenectomy    . Cholecystectomy, laparoscopic    . Port-a-cath removal    . Portacath placement    . Tonsillectomy    . Eye surgery      x2 as child   HPI:  63 yo male referred for MBS by neurology to establish a baseline per pt report.  Pt has h/o Parkinsonism, dystonias (spasmodic dystonia since 1994, torticollis, nonhodgkins lymphoma - cervical lymphadectomy.  Pt reports taking a new medication for parkinson's in the last 1 1/2 weeks but has not reached therapeutic levels and therefore does not report symptom improvement.  Wife present reports pt has been coughing more recently - with and without intake.  Symptoms include sensation of food lodging in throat, trouble swallowing large pills, throat clearing/coughing during meals.  Pt denies pulmonary infections nor weight loss.       Assessment / Plan / Recommendation Clinical Impression  Dysphagia Diagnosis: Mild oral phase dysphagia;Moderate pharyngeal phase dysphagia  Clinical impression: Mild oral and moderate pharyngeal dysphagia characterized by sensorimotor impairments- which pt states are chronic for years due to his dystonias.  Oral difficulties characterized by discoordination resulting in decreased bolus cohesion, delayed oral transiting and piecemealing across consistencies.  Pharyngeal swallow with decreased lingual base retraction and laryngeal elevation  resulting in pharyngeal stasis (woresr with solid than liquid) without consistent awareness.  Pt did swallow multiple times with each bolus during entire test and reports this to be his baseline for years and this was helpful to decrease residue. Barium tablet lodged at vallecular space with pt awareness, clearing into esophagus with more liquids.   Chin tuck posture worsened airway protection and head turn left did not decrease/prevent pharyngeal residuals.  Advised pt and spouse to compensation strategies using live video and teach back.  Provided exercises to strengthen laryngeal elevators and pharyngeal contraction in writing.  Thanks for this referral.        Treatment Recommendation    OP for LSVT and trial dysphagia treatment if referring MD indicates   Diet Recommendation Regular;Thin liquid   Medication Administration: Whole meds with liquid (whole with pudding or crushed if problematic and not contraindicated) Supervision: Patient able to self feed Compensations: Slow rate;Small sips/bites;Effortful swallow;Multiple dry swallows after each bite/sip;Follow solids with liquid (start meal with liquids) Postural Changes and/or Swallow Maneuvers: Seated upright 90 degrees;Upright 30-60 min after meal    Other  Recommendations Oral Care Recommendations: Oral care BID   Follow Up Recommendations  Outpatient SLP (? if pt would benefit from LSVT)      General Date of Onset: 08/07/14 HPI: 63 yo male referred for MBS by neurology to establish a baseline per pt report.  Pt has h/o Parkinsonism, dystonias (spasmodic dystonia since 1994, meger syndrome, torticollis, nonhodgkins lymphoma - cervical lymphadectomy.  Pt reports taking a new medication for parkinson's in the last 1 1/2 weeks but has not reached  therapeutic levels and therefore does not report symptom improvement.  Wife present reports pt has been coughing more recently - with and without intake.  Symptoms include sensation of food  lodging in throat, trouble swallowing large pills, throat clearing/coughing during meals.  Pt denies pulmonary infections nor weight loss.   Type of Study: Modified Barium Swallowing Study Reason for Referral: Objectively evaluate swallowing function Diet Prior to this Study: Regular;Thin liquids Temperature Spikes Noted: No Respiratory Status: Room air History of Recent Intubation: No Behavior/Cognition: Alert;Cooperative;Pleasant mood Oral Cavity - Dentition: Adequate natural dentition Oral Motor / Sensory Function:  (dystonia noted, ? facial asymmetry, lingual protrusion midline, palatal elevation midline) Self-Feeding Abilities: Able to feed self (has tremor right more than left during testing) Patient Positioning: Upright in chair Baseline Vocal Quality: Clear Volitional Cough: Strong Volitional Swallow: Able to elicit Anatomy: Within functional limits Pharyngeal Secretions: Not observed secondary MBS    Reason for Referral Objectively evaluate swallowing function   Oral Phase Oral Preparation/Oral Phase Oral Phase: Impaired Oral - Nectar Oral - Nectar Cup: Weak lingual manipulation;Delayed oral transit;Reduced posterior propulsion;Piecemeal swallowing Oral - Thin Oral - Thin Teaspoon: Weak lingual manipulation;Delayed oral transit;Reduced posterior propulsion Oral - Thin Cup: Weak lingual manipulation;Delayed oral transit;Reduced posterior propulsion;Piecemeal swallowing Oral - Thin Straw: Weak lingual manipulation;Delayed oral transit;Reduced posterior propulsion;Piecemeal swallowing Oral - Solids Oral - Puree: Weak lingual manipulation;Delayed oral transit;Reduced posterior propulsion;Piecemeal swallowing Oral - Regular: Weak lingual manipulation;Delayed oral transit;Reduced posterior propulsion (barium tablet adhered to tongue, transited with more liquid) Oral - Pill: Weak lingual manipulation;Delayed oral transit;Reduced posterior propulsion   Pharyngeal Phase Pharyngeal  Phase Pharyngeal Phase: Impaired Pharyngeal - Nectar Pharyngeal - Nectar Cup: Reduced anterior laryngeal mobility;Reduced laryngeal elevation;Reduced airway/laryngeal closure;Pharyngeal residue - valleculae;Pharyngeal residue - pyriform sinuses;Reduced tongue base retraction Pharyngeal - Thin Pharyngeal - Thin Teaspoon: Reduced anterior laryngeal mobility;Reduced laryngeal elevation;Reduced airway/laryngeal closure;Pharyngeal residue - valleculae;Pharyngeal residue - pyriform sinuses;Reduced tongue base retraction Pharyngeal - Thin Cup: Reduced anterior laryngeal mobility;Reduced laryngeal elevation;Reduced airway/laryngeal closure;Pharyngeal residue - valleculae;Pharyngeal residue - pyriform sinuses;Reduced tongue base retraction Pharyngeal - Thin Straw: Reduced anterior laryngeal mobility;Reduced laryngeal elevation;Reduced airway/laryngeal closure;Pharyngeal residue - valleculae;Pharyngeal residue - pyriform sinuses;Reduced tongue base retraction;Penetration/Aspiration during swallow Penetration/Aspiration details (thin straw): Material enters airway, CONTACTS cords and not ejected out (only with sequential swallows via straw per SLP cue, cued throat clear effective) Pharyngeal - Solids Pharyngeal - Puree: Reduced anterior laryngeal mobility;Reduced laryngeal elevation;Reduced airway/laryngeal closure;Pharyngeal residue - valleculae;Reduced tongue base retraction;Penetration/Aspiration before swallow Penetration/Aspiration details (puree): Material enters airway, remains ABOVE vocal cords then ejected out (only with chin tuck posture) Pharyngeal - Regular: Reduced anterior laryngeal mobility;Reduced laryngeal elevation;Reduced airway/laryngeal closure;Pharyngeal residue - valleculae;Pharyngeal residue - pyriform sinuses;Reduced tongue base retraction Pharyngeal - Pill: Pharyngeal residue - valleculae;Reduced tongue base retraction Pharyngeal Phase - Comment Pharyngeal Comment: multiple swallows  reflexive and cued as well as following solids with drink faciliated clearance, chin tuck worsened airway protection, head turn right not helpful  Cervical Esophageal Phase    GO    Cervical Esophageal Phase Cervical Esophageal Phase: Impaired Cervical Esophageal Phase - Comment Cervical Esophageal Comment: appearance of mildly slow clearance through distal esophagus, barium tablet appeared to lodge at [place] momentarily, radiologist not present to confirm findings    Functional Assessment Tool Used: mbs, clinical judgement Functional Limitations: Swallowing Swallow Current Status ([id number]): At least 20 percent but less than 40 percent impaired, limited or restricted Swallow Goal Status [phone]): At least 20 percent but less than 40 percent impaired, limited or restricted Swallow  Discharge Status [phone]): At least 20 percent but less than 40 percent impaired, limited or restricted    Luanna Salk, [facility] SLP [phone]

## 2014-08-24 ENCOUNTER — Ambulatory Visit (HOSPITAL_COMMUNITY)
Admission: RE | Admit: 2014-08-24 | Discharge: 2014-08-24 | Disposition: A | Discharge status: Home / Self Care | Payer: BC Managed Care – PPO | Source: Ambulatory Visit | Attending: Neurology | Admitting: Neurology

## 2014-08-24 DIAGNOSIS — R490 Dysphonia: Secondary | ICD-10-CM | POA: Diagnosis present

## 2014-08-24 DIAGNOSIS — R251 Tremor, unspecified: Secondary | ICD-10-CM | POA: Diagnosis not present

## 2014-08-24 DIAGNOSIS — Z8572 Personal history of non-Hodgkin lymphomas: Secondary | ICD-10-CM | POA: Insufficient documentation

## 2014-08-24 LAB — CREATININE, SERUM
CREATININE: 1 mg/dL (ref 0.50–1.35)
GFR calc Af Amer: >90 mL/min (ref >90)
GFR, EST NON AFRICAN AMERICAN: 78 mL/min — AB (ref >90)

## 2014-08-24 MED ORDER — GADOBENATE DIMEGLUMINE 529 MG/ML IV SOLN
17.0000 mL | Freq: Once | INTRAVENOUS | Status: AC | PRN
  Administered 2014-08-24: 17 mL via INTRAVENOUS

## 2014-08-25 ENCOUNTER — Encounter: Payer: Self-pay | Admitting: Neurology

## 2014-08-25 ENCOUNTER — Telehealth: Payer: Self-pay | Admitting: Neurology

## 2014-08-25 NOTE — Telephone Encounter (Signed)
This encounter was created in error - please disregard.

## 2014-08-25 NOTE — Telephone Encounter (Signed)
-----   Message from [place], DO sent at 08/25/2014 11:41 AM EST ----- Let pt know that MRI brain looks good

## 2014-08-25 NOTE — Telephone Encounter (Signed)
Patient made aware MR Brain looked good.

## 2014-09-26 ENCOUNTER — Ambulatory Visit (INDEPENDENT_AMBULATORY_CARE_PROVIDER_SITE_OTHER): Payer: BLUE CROSS/BLUE SHIELD | Admitting: Neurology

## 2014-09-26 ENCOUNTER — Encounter: Payer: Self-pay | Admitting: Neurology

## 2014-09-26 VITALS — BP 114/78 | HR 76 | Ht 66.0 in | Wt 174.0 lb

## 2014-09-26 DIAGNOSIS — G20 Parkinson's disease: Secondary | ICD-10-CM

## 2014-09-26 DIAGNOSIS — G609 Hereditary and idiopathic neuropathy, unspecified: Secondary | ICD-10-CM

## 2014-09-26 DIAGNOSIS — K5901 Slow transit constipation: Secondary | ICD-10-CM

## 2014-09-26 DIAGNOSIS — Z8572 Personal history of non-Hodgkin lymphomas: Secondary | ICD-10-CM

## 2014-09-26 LAB — COMPREHENSIVE METABOLIC PANEL
ALBUMIN: 4.4 g/dL (ref 3.5–5.2)
ALT: 43 U/L (ref 0–53)
AST: 26 U/L (ref 0–37)
Alkaline Phosphatase: 59 U/L (ref 39–117)
BILIRUBIN TOTAL: 0.6 mg/dL (ref 0.2–1.2)
BUN: 11 mg/dL (ref 6–23)
CO2: 28 mEq/L (ref 19–32)
CREATININE: 0.94 mg/dL (ref 0.50–1.35)
Calcium: 9.2 mg/dL (ref 8.4–10.5)
Chloride: 103 mEq/L (ref 96–112)
GLUCOSE: 88 mg/dL (ref 70–99)
Potassium: 4.3 mEq/L (ref 3.5–5.3)
Sodium: 140 mEq/L (ref 135–145)
TOTAL PROTEIN: 6.6 g/dL (ref 6.0–8.3)

## 2014-09-26 LAB — CBC WITH DIFFERENTIAL/PLATELET
BASOS PCT: 0 % (ref 0–1)
Basophils Absolute: 0 10*3/uL (ref 0.0–0.1)
EOS ABS: 0.2 10*3/uL (ref 0.0–0.7)
Eosinophils Relative: 2 % (ref 0–5)
HCT: 44.9 % (ref 39.0–52.0)
Hemoglobin: 15.8 g/dL (ref 13.0–17.0)
LYMPHS ABS: 4.6 10*3/uL — AB (ref 0.7–4.0)
LYMPHS PCT: 37 % (ref 12–46)
MCH: 33.1 pg (ref 26.0–34.0)
MCHC: 35.2 g/dL (ref 30.0–36.0)
MCV: 94.1 fL (ref 78.0–100.0)
MONO ABS: 1.1 10*3/uL — AB (ref 0.1–1.0)
MPV: 9.9 fL (ref 8.6–12.4)
Monocytes Relative: 9 % (ref 3–12)
NEUTROS ABS: 6.4 10*3/uL (ref 1.7–7.7)
Neutrophils Relative %: 52 % (ref 43–77)
PLATELETS: 307 10*3/uL (ref 150–400)
RBC: 4.77 MIL/uL (ref 4.22–5.81)
RDW: 15.1 % (ref 11.5–15.5)
WBC: 12.4 10*3/uL — ABNORMAL HIGH (ref 4.0–10.5)

## 2014-09-26 LAB — VITAMIN B12: Vitamin B-12: 292 pg/mL (ref 211–911)

## 2014-09-26 LAB — FOLATE: Folate: 7.7 ng/mL

## 2014-09-26 MED ORDER — PRAMIPEXOLE DIHYDROCHLORIDE ER 1.5 MG PO TB24: 1.5000 mg | ORAL_TABLET | Freq: Every day | ORAL | Status: DC

## 2014-09-26 NOTE — Patient Instructions (Addendum)
1. Your provider has requested that you have labwork completed today. Please go to [facility] on the first floor of this building before leaving the office today. 2. Stop b-6 supplements 3. Rancho recipe for constipation in Parkinsons Disease:  -1 cup of bran, 2 cups of applesauce in 1 cup of prune juice 4. Start Melatonin - 3 mg tablets. You can get this over the counter.

## 2014-09-26 NOTE — Progress Notes (Signed)
[name] [name] was seen today in the movement disorders clinic for neurologic consultation at the request of [name], DO.  The consultation is for the evaluation of tremor.  This patient is accompanied in the office by his spouse who supplements the history.    The pt was dx with dystonia in [place] in 1994.  He presented to the speech pathologist and was dx with spasmodic dysphonia.  He was then referred to the neurologist who confirmed the diagnosis and did and MRI of the brain that was normal.  He had botox in to the vocal cords and that was helpful.  He would lose the voice for 6 weeks after the injections and then would get the voice back for 6 weeks.  He would go back to [place] twice per year for the injections.  He hasn't had the injections for the last 2 years as he quit working.  The spasmodic dysphonia is worse on the phone.  Last ST was 1995.  Having some trouble with swallowing.  Had developed some movements in the face and tried botox there but didn't really help there.  Has had some neck extension as well with the dystonic movements.  Wife states that these sx's have been better since retirement; he is under less stress.  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  The patient reports that [name] tremor started in the thumb in may and has slowly gotten worse and 3 weeks ago, he noted just a little in the [name].  He notes the tremor most at rest.  09/26/14 update:  The patient returns today for follow-up.  The patient has a history of Meige's syndrome, but presented last time with parkinsonism, but did not meet all the criteria for idiopathic Parkinson's disease.  Nonetheless, the patient wanted to start on medication.  We started him on Mirapex ER and he has worked his way up to 1.5 mg daily.  The patient reports that he is about 50% better.  He states that it has helped tremor but it isn't gone.  His wife thinks that mirapex is disrupting his sleep.   He has constipation and thought that it  was the mirapex.  His balance has been good.   He had an MRI of the brain since last visit and that was unremarkable.  Because of significant dysphagia from his Meige syndrome he had a modified barium swallow on 08/07/2014 and that revealed mild oral dysphagia, moderate pharyngeal phase dysphagia and a regular diet with thin liquids was recommended.   Has a strange sensation under the bottom of the foot like something is under the ball of the foot.  Has had paresthesias off and on for a long time.  Did get chemo with nonHodgkins and been on B6 since.     ALLERGIES:  No Known Allergies  CURRENT MEDICATIONS:  Outpatient Encounter Prescriptions as of 09/26/2014  Medication Sig  . aspirin 81 MG chewable tablet Chew 81 mg by mouth daily.  . cholecalciferol (VITAMIN D) 1000 UNITS tablet Take 1,000 Units by mouth daily.    . Pramipexole Dihydrochloride (MIRAPEX ER) 1.5 MG TB24 Take 1 tablet (1.5 mg total) by mouth daily.  Marland Kitchen pyridOXINE (VITAMIN B-6) 100 MG tablet Take 100 mg by mouth daily.  . [DISCONTINUED] Pramipexole Dihydrochloride (MIRAPEX ER) 0.375 MG TB24 Take one tablet once daily for one week, then twice daily for one week, then switch to 1.5 mg tablets    PAST MEDICAL HISTORY:  Past Medical History  Diagnosis Date  . Dystonia 1994    diagnosed in [place]  . Non Hodgkin's lymphoma     diffuse- 6 cycles of chemo with R CHOP Rituxan - last dose 10/08  . Cervical lymphadenopathy     right - followed my ENT    PAST SURGICAL HISTORY:   Past Surgical History  Procedure Laterality Date  . Splenectomy    . Cholecystectomy, laparoscopic    . Port-a-cath removal    . Portacath placement    . Tonsillectomy    . Eye surgery      x2 as child    SOCIAL HISTORY:   History   Social History  . Marital Status: Married    Spouse Name: N/A    Number of Children: N/A  . Years of Education: N/A   Occupational History  . Not on file.   Social History Main Topics  . Smoking status: Never  Smoker   . Smokeless tobacco: Not on file  . Alcohol Use: No  . Drug Use: No  . Sexual Activity: Not on file   Other Topics Concern  . Not on file   Social History Narrative    FAMILY HISTORY:   Family Status  Relation Status Death Age  . Mother Deceased     heart disease, lung cancer  . Father Deceased     heart attack  . Brother Deceased     colon cancer  . Sister Alive     unknown  . Son Alive     anemia  . Son Alive     healthy  . Daughter Alive     healthy  . Son Deceased     debrancher's enzyme disease  . Son Deceased     debrancher's enzyme disease    ROS:  A complete 10 system review of systems was obtained and was unremarkable apart from what is mentioned above.  PHYSICAL EXAMINATION:    VITALS:   Filed Vitals:   09/26/14 0820  BP: 114/78  Pulse: 76  Height: 5\' 6"  (1.676 m)  Weight: 174 lb (78.926 kg)    GEN:  The patient appears stated age and is in NAD. HEENT:  Normocephalic, atraumatic.  The mucous membranes are moist. The superficial temporal arteries are without ropiness or tenderness. CV:  RRR Lungs:  CTAB Neck/HEME:  There are no carotid bruits bilaterally.  Neurological examination:  Orientation: The patient is alert and oriented x3. Fund of knowledge is appropriate.  Recent and remote memory are intact.  Attention and concentration are normal.    Able to name objects and repeat phrases. Cranial nerves: There is good facial symmetry.  Slight facial hypomimia.  Mild oromandibular dystonia. Pupils are equal round and reactive to light bilaterally. Fundoscopic exam reveals clear margins bilaterally. There is L exotropia (chronic). The visual fields are full to confrontational testing. The speech bulbar in quality and signifcant spasmodic dysphonia.   Soft palate rises symmetrically and there is no tongue deviation. Hearing is intact to conversational tone. Sensation: Sensation is intact to light and pinprick throughout (facial, trunk,  extremities).  Pinprick is decreased in a stocking distribution.  Vibration is intact at the bilateral big toe but it is decreased. There is no extinction with double simultaneous stimulation. There is no sensory dermatomal level identified. Motor: Strength is 5/5 in the bilateral upper and lower extremities.   Shoulder shrug is equal and symmetric.  There is no pronator drift. Deep tendon reflexes: Deep tendon  reflexes are 2/4 at the bilateral biceps, triceps, brachioradialis, patella and trace at the bilateral achilles. Plantar responses are downgoing bilaterally.  Movement examination: Tone: There is normal tone in the bilateral upper extremities.  The tone in the lower extremities is normal.  Abnormal movements: There is mild-mod, near constant [name] resting tremor that increases with ambulation and distraction Coordination:  There is no decremation with rapid alternating movements. Gait and Station: The patient has no difficulty arising out of a deep-seated chair without the use of the hands. The patient's stride length is normal.  The patient has a negative pull test.     No results found for: VITAMINB12    ASSESSMENT/PLAN:  1.  Parkinsonism  -Pt likely has early PD, but doesn't meet all criteria yet according to the brain bank criteria, but likely does have early PD. Even though he has prominent facial dystonia (meige syndrome), I don't think that this is related as this started in 1994.  He has found Mirapex to be helpful.  I really did not want to increase the dose even though he has tremor still and he agreed.  He has no compulsive side effects with the Mirapex.   -I don't think that the insomnia is related to Mirapex.  That would be a very unusual side effects.  I did recommend melatonin, 3 mg nightly.   2.  Dysphagia  -primarily related to Meige syndrome.  -he had a modified barium swallow on 08/07/2014 and that revealed mild oral dysphagia, moderate pharyngeal phase dysphagia and a  regular diet with thin liquids was recommended. 3.  Paresthesias.  -Probable due to peripheral neuropathy, which is likely due to his history of chemotherapy from non-Hodgkin's lymphoma.  We will do some lab work.  Opted to hold on EMG.  I asked him to discontinue his B6 4.  Constipation  -given copy of rancho recipe 5.  Follow-up in the next few months, sooner should new neurologic issues arise.

## 2014-09-27 LAB — RPR

## 2014-09-28 LAB — UIFE/LIGHT CHAINS/TP QN, 24-HR UR
ALPHA 2 UR: DETECTED — AB
Albumin, U: DETECTED
Alpha 1, Urine: DETECTED — AB
BETA UR: DETECTED — AB
GAMMA UR: DETECTED — AB
Total Protein, Urine: 9 mg/dL (ref 5–25)

## 2014-09-29 ENCOUNTER — Telehealth: Payer: Self-pay | Admitting: Neurology

## 2014-09-29 LAB — SPEP & IFE WITH QIG
Albumin ELP: 62.5 % (ref 55.8–66.1)
Alpha-1-Globulin: 3 % (ref 2.9–4.9)
Alpha-2-Globulin: 11.5 % (ref 7.1–11.8)
Beta 2: 5.1 % (ref 3.2–6.5)
Beta Globulin: 6.3 % (ref 4.7–7.2)
GAMMA GLOBULIN: 11.6 % (ref 11.1–18.8)
IGM, SERUM: 20 mg/dL — AB (ref 41–251)
IgA: 274 mg/dL (ref 68–379)
IgG (Immunoglobin G), Serum: 858 mg/dL (ref 650–1600)
Total Protein, Serum Electrophoresis: 6.6 g/dL (ref 6.0–8.3)

## 2014-09-29 NOTE — Telephone Encounter (Signed)
-----   Message from Jackson, DO sent at 09/29/2014  1:41 PM EST ----- Luvenia Starch, will you let pt know that most of labs looked okay but white count was just a little high on his CBC.  Given his history, I want him to f/u with PCP

## 2014-09-29 NOTE — Telephone Encounter (Signed)
Patient made aware. Copy of labs faxed to Dr Shawna Orleans at [phone] with confirmation received.

## 2014-10-04 ENCOUNTER — Encounter: Payer: Self-pay | Admitting: Internal Medicine

## 2014-10-04 ENCOUNTER — Ambulatory Visit (INDEPENDENT_AMBULATORY_CARE_PROVIDER_SITE_OTHER): Payer: BLUE CROSS/BLUE SHIELD | Admitting: Internal Medicine

## 2014-10-04 VITALS — BP 120/80 | HR 71 | Temp 98.2°F | Resp 18 | Ht 66.0 in | Wt 175.0 lb

## 2014-10-04 DIAGNOSIS — G20 Parkinson's disease: Secondary | ICD-10-CM

## 2014-10-04 DIAGNOSIS — M545 Low back pain: Secondary | ICD-10-CM

## 2014-10-04 DIAGNOSIS — D72829 Elevated white blood cell count, unspecified: Secondary | ICD-10-CM

## 2014-10-04 DIAGNOSIS — R740 Nonspecific elevation of levels of transaminase and lactic acid dehydrogenase [LDH]: Secondary | ICD-10-CM

## 2014-10-04 DIAGNOSIS — R7401 Elevation of levels of liver transaminase levels: Secondary | ICD-10-CM

## 2014-10-04 LAB — CBC WITH DIFFERENTIAL/PLATELET
BASOS ABS: 0 10*3/uL (ref 0.0–0.1)
BASOS PCT: 0.4 % (ref 0.0–3.0)
Eosinophils Absolute: 0.3 10*3/uL (ref 0.0–0.7)
Eosinophils Relative: 2.9 % (ref 0.0–5.0)
HCT: 46 % (ref 39.0–52.0)
Hemoglobin: 15.2 g/dL (ref 13.0–17.0)
Lymphocytes Relative: 46.6 % — ABNORMAL HIGH (ref 12.0–46.0)
Lymphs Abs: 5.1 10*3/uL — ABNORMAL HIGH (ref 0.7–4.0)
MCHC: 33 g/dL (ref 30.0–36.0)
MCV: 98.7 fl (ref 78.0–100.0)
Monocytes Absolute: 1 10*3/uL (ref 0.1–1.0)
Monocytes Relative: 8.7 % (ref 3.0–12.0)
Neutro Abs: 4.6 10*3/uL (ref 1.4–7.7)
Neutrophils Relative %: 41.4 % — ABNORMAL LOW (ref 43.0–77.0)
Platelets: 285 10*3/uL (ref 150.0–400.0)
RBC: 4.66 Mil/uL (ref 4.22–5.81)
RDW: 15.2 % (ref 11.5–15.5)
WBC: 11 10*3/uL — ABNORMAL HIGH (ref 4.0–10.5)

## 2014-10-04 NOTE — Progress Notes (Signed)
Subjective:    Patient ID: [name] [name], male    DOB: July 31, 1951, 64 y.o.   MRN: [id number]  HPI  64 year old patient who is seen today in follow-up.  He has a history of PD and PN and is followed by neurology.  Laboratory screen revealed a slight leukocytosis.  He does have a prior history of non-Hodgkin's lymphoma.  Subsequent to the CBC, the patient developed sore throat and URI symptoms.  These have resolved.  Today he feels well. Denies any fever or constitutional complaints.  No pruritus  Past Medical History  Diagnosis Date  . Dystonia 1994    diagnosed in [place]  . Non Hodgkin's lymphoma     diffuse- 6 cycles of chemo with R CHOP Rituxan - last dose 10/08  . Cervical lymphadenopathy     right - followed my ENT    History   Social History  . Marital Status: Married    Spouse Name: N/A    Number of Children: N/A  . Years of Education: N/A   Occupational History  . Not on file.   Social History Main Topics  . Smoking status: Never Smoker   . Smokeless tobacco: Not on file  . Alcohol Use: No  . Drug Use: No  . Sexual Activity: Not on file   Other Topics Concern  . Not on file   Social History Narrative    Past Surgical History  Procedure Laterality Date  . Splenectomy    . Cholecystectomy, laparoscopic    . Port-a-cath removal    . Portacath placement    . Tonsillectomy    . Eye surgery      x2 as child    Family History  Problem Relation Age of Onset  . Heart disease Mother   . Cancer Mother     lung  . Heart disease Father     No Known Allergies  Current Outpatient Prescriptions on File Prior to Visit  Medication Sig Dispense Refill  . aspirin 81 MG chewable tablet Chew 81 mg by mouth daily.    . Pramipexole Dihydrochloride (MIRAPEX ER) 1.5 MG TB24 Take 1 tablet (1.5 mg total) by mouth daily. 90 tablet 3  . pyridOXINE (VITAMIN B-6) 100 MG tablet Take 100 mg by mouth daily.     No current facility-administered medications on file prior  to visit.    BP 120/80 mmHg  Pulse 71  Temp(Src) 98.2 F (36.8 C) (Oral)  Resp 18  Ht 5\' 6"  (1.676 m)  Wt 175 lb (79.379 kg)  BMI 28.26 kg/m2  SpO2 96%     Review of Systems  Constitutional: Negative for fever, chills, appetite change and fatigue.  HENT: Negative for congestion, dental problem, ear pain, hearing loss, sore throat, tinnitus, trouble swallowing and voice change.   Eyes: Negative for pain, discharge and visual disturbance.  Respiratory: Negative for cough, chest tightness, wheezing and stridor.   Cardiovascular: Negative for chest pain, palpitations and leg swelling.  Gastrointestinal: Positive for constipation. Negative for nausea, vomiting, abdominal pain, diarrhea, blood in stool and abdominal distention.  Genitourinary: Negative for urgency, hematuria, flank pain, discharge, difficulty urinating and genital sores.  Musculoskeletal: Negative for myalgias, back pain, joint swelling, arthralgias, gait problem and neck stiffness.  Skin: Negative for rash.  Neurological: Positive for tremors. Negative for dizziness, syncope, speech difficulty, weakness, numbness and headaches.  Hematological: Negative for adenopathy. Does not bruise/bleed easily.  Psychiatric/Behavioral: Negative for behavioral problems and dysphoric mood. The patient is not  nervous/anxious.        Objective:   Physical Exam  Constitutional: He is oriented to person, place, and time. He appears well-developed.  HENT:  Head: Normocephalic.  Right Ear: External ear normal.  Left Ear: External ear normal.  Eyes: Conjunctivae and EOM are normal.  Neck: Normal range of motion.  3 cm subcutaneous nodule in the high right submandibular area that has been chronic and unchanged  Cardiovascular: Normal rate and normal heart sounds.   Pulmonary/Chest: Breath sounds normal.  Abdominal: Bowel sounds are normal.  Musculoskeletal: Normal range of motion. He exhibits no edema or tenderness.  Neurological:  He is alert and oriented to person, place, and time.  Parkinsonian tremor  Psychiatric: He has a normal mood and affect. His behavior is normal.          Assessment & Plan:   Mild leukocytosis.  We'll repeat and hopefully has normalized History of non-Hodgkin's lymphoma PD.  Follow-up neurology

## 2014-10-04 NOTE — Patient Instructions (Signed)
Call or return to clinic prn if these symptoms worsen or fail to improve as anticipated.

## 2014-10-04 NOTE — Progress Notes (Signed)
Pre visit review using our clinic review tool, if applicable. No additional management support is needed unless otherwise documented below in the visit note. 

## 2015-01-02 ENCOUNTER — Encounter: Payer: Self-pay | Admitting: Neurology

## 2015-01-02 ENCOUNTER — Ambulatory Visit (INDEPENDENT_AMBULATORY_CARE_PROVIDER_SITE_OTHER): Payer: BLUE CROSS/BLUE SHIELD | Admitting: Neurology

## 2015-01-02 VITALS — BP 110/70 | HR 74 | Ht 66.0 in | Wt 176.0 lb

## 2015-01-02 DIAGNOSIS — G244 Idiopathic orofacial dystonia: Secondary | ICD-10-CM | POA: Diagnosis not present

## 2015-01-02 DIAGNOSIS — G20 Parkinson's disease: Secondary | ICD-10-CM

## 2015-01-02 DIAGNOSIS — E538 Deficiency of other specified B group vitamins: Secondary | ICD-10-CM

## 2015-01-02 NOTE — Patient Instructions (Signed)
1.  Start B12 - 1000 micrograms daily. 2.  Continue mirapex er - 1.5 mg daily

## 2015-01-02 NOTE — Addendum Note (Signed)
Addended by: Ella Jubilee on: 01/02/2015 08:47 AM   Modules accepted: Orders

## 2015-01-02 NOTE — Progress Notes (Signed)
[name] [name] was seen today in the movement disorders clinic for neurologic consultation at the request of [name], DO.  The consultation is for the evaluation of tremor.  This patient is accompanied in the office by his spouse who supplements the history.    The pt was dx with dystonia in [place] in 1994.  He presented to the speech pathologist and was dx with spasmodic dysphonia.  He was then referred to the neurologist who confirmed the diagnosis and did and MRI of the brain that was normal.  He had botox in to the vocal cords and that was helpful.  He would lose the voice for 6 weeks after the injections and then would get the voice back for 6 weeks.  He would go back to [place] twice per year for the injections.  He hasn't had the injections for the last 2 years as he quit working.  The spasmodic dysphonia is worse on the phone.  Last ST was 1995.  Having some trouble with swallowing.  Had developed some movements in the face and tried botox there but didn't really help there.  Has had some neck extension as well with the dystonic movements.  Wife states that these sx's have been better since retirement; he is under less stress.  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  The patient reports that [name] tremor started in the thumb in may and has slowly gotten worse and 3 weeks ago, he noted just a little in the [name].  He notes the tremor most at rest.  09/26/14 update:  The patient returns today for follow-up.  The patient has a history of Meige's syndrome, but presented last time with parkinsonism, but did not meet all the criteria for idiopathic Parkinson's disease.  Nonetheless, the patient wanted to start on medication.  We started him on Mirapex ER and he has worked his way up to 1.5 mg daily.  The patient reports that he is about 50% better.  He states that it has helped tremor but it isn't gone.  His wife thinks that mirapex is disrupting his sleep.   He has constipation and thought that it  was the mirapex.  His balance has been good.   He had an MRI of the brain since last visit and that was unremarkable.  Because of significant dysphagia from his Meige syndrome he had a modified barium swallow on 08/07/2014 and that revealed mild oral dysphagia, moderate pharyngeal phase dysphagia and a regular diet with thin liquids was recommended.   Has a strange sensation under the bottom of the foot like something is under the ball of the foot.  Has had paresthesias off and on for a long time.  Did get chemo with nonHodgkins and been on B6 since.  01/02/15 update:  The patient is following up today, accompanied by his wife is supplements the history.  The patient is on Mirapex ER, 1.5 mg daily.  Overall, the patient states that he is doing well.  No falls.  No hallucinations.  He continues to have issues with dysphagia, but that is primarily from his long history of Meige syndrome.  He continues to not need much sleep at night but it is not transitioning into daytime sleepiness.       ALLERGIES:  No Known Allergies  CURRENT MEDICATIONS:  Outpatient Encounter Prescriptions as of 01/02/2015  Medication Sig  . aspirin 81 MG chewable tablet Chew 81 mg by mouth daily.  Marland Kitchen  Pramipexole Dihydrochloride (MIRAPEX ER) 1.5 MG TB24 Take 1 tablet (1.5 mg total) by mouth daily.  Marland Kitchen pyridOXINE (VITAMIN B-6) 100 MG tablet Take 100 mg by mouth daily.    PAST MEDICAL HISTORY:   Past Medical History  Diagnosis Date  . Dystonia 1994    diagnosed in [place]  . Non Hodgkin's lymphoma     diffuse- 6 cycles of chemo with R CHOP Rituxan - last dose 10/08  . Cervical lymphadenopathy     right - followed my ENT    PAST SURGICAL HISTORY:   Past Surgical History  Procedure Laterality Date  . Splenectomy    . Cholecystectomy, laparoscopic    . Port-a-cath removal    . Portacath placement    . Tonsillectomy    . Eye surgery      x2 as child    SOCIAL HISTORY:   History   Social History  . Marital Status:  Married    Spouse Name: N/A  . Number of Children: N/A  . Years of Education: N/A   Occupational History  . Not on file.   Social History Main Topics  . Smoking status: Never Smoker   . Smokeless tobacco: Not on file  . Alcohol Use: No  . Drug Use: No  . Sexual Activity: Not on file   Other Topics Concern  . Not on file   Social History Narrative    FAMILY HISTORY:   Family Status  Relation Status Death Age  . Mother Deceased     heart disease, lung cancer  . Father Deceased     heart attack  . Brother Deceased     colon cancer  . Sister Alive     unknown  . Son Alive     anemia  . Son Alive     healthy  . Daughter Alive     healthy  . Son Deceased     debrancher's enzyme disease  . Son Deceased     debrancher's enzyme disease    ROS:  A complete 10 system review of systems was obtained and was unremarkable apart from what is mentioned above.  PHYSICAL EXAMINATION:    VITALS:   There were no vitals filed for this visit.  GEN:  The patient appears stated age and is in NAD. HEENT:  Normocephalic, atraumatic.  The mucous membranes are moist. The superficial temporal arteries are without ropiness or tenderness. CV:  RRR Lungs:  CTAB Neck/HEME:  There are no carotid bruits bilaterally.  Neurological examination:  Orientation: The patient is alert and oriented x3. Fund of knowledge is appropriate.  Recent and remote memory are intact.  Attention and concentration are normal.    Able to name objects and repeat phrases. Cranial nerves: There is good facial symmetry.  Slight facial hypomimia.  Mild oromandibular dystonia. Pupils are equal round and reactive to light bilaterally. Fundoscopic exam reveals clear margins bilaterally. There is L exotropia (chronic). The visual fields are full to confrontational testing. The speech bulbar in quality and signifcant spasmodic dysphonia.   Soft palate rises symmetrically and there is no tongue deviation. Hearing is intact to  conversational tone. Sensation: Sensation is intact to light and pinprick throughout (facial, trunk, extremities).  Pinprick is decreased in a stocking distribution.  Vibration is intact at the bilateral big toe but it is decreased. There is no extinction with double simultaneous stimulation. There is no sensory dermatomal level identified. Motor: Strength is 5/5 in the bilateral upper and lower extremities.  Shoulder shrug is equal and symmetric.  There is no pronator drift. Deep tendon reflexes: Deep tendon reflexes are 2/4 at the bilateral biceps, triceps, brachioradialis, patella and trace at the bilateral achilles. Plantar responses are downgoing bilaterally.  Movement examination: Tone: There is normal tone in the bilateral upper extremities.  The tone in the lower extremities is normal.  Abnormal movements: There is mild-mod, near constant [name] resting tremor that increases with ambulation and distraction Coordination:  There is no decremation with rapid alternating movements. Gait and Station: The patient has no difficulty arising out of a deep-seated chair without the use of the hands. The patient's stride length is normal.  The patient has a negative pull test.     Lab Results  Component Value Date   VITAMINB12 292 09/26/2014      ASSESSMENT/PLAN:  1.  Parkinsonism  -Pt likely has early PD, but doesn't meet all criteria yet according to the brain bank criteria, but likely does have early PD. Even though he has prominent facial dystonia (meige syndrome), I don't think that this is related as this started in 1994.  He has found Mirapex to be helpful.   He does think that it has helped tremor but not fully but we both decided it would not be worth increasing the dosage. He has no compulsive side effects with the Mirapex.   -Refer to neurorehab for PT/ST 2.  Dysphagia  -primarily related to Meige syndrome.  -he had a modified barium swallow on 08/07/2014 and that revealed mild oral  dysphagia, moderate pharyngeal phase dysphagia and a regular diet with thin liquids was recommended. 3.  Paresthesias.  -Probable due to peripheral neuropathy, which is likely due to his history of chemotherapy from non-Hodgkin's lymphoma.    -His B12 is just slightly on the low end and would like to see him add a B12 supplement, 1038mcg daily and will recheck sometime in the future. 4.  Constipation  -given copy of rancho recipe 5.  Follow-up in the next few months (4 months), sooner should new neurologic issues arise.

## 2015-03-20 ENCOUNTER — Other Ambulatory Visit: Payer: Self-pay | Admitting: *Deleted

## 2015-03-20 DIAGNOSIS — Z0000 Encounter for general adult medical examination without abnormal findings: Secondary | ICD-10-CM

## 2015-03-27 ENCOUNTER — Other Ambulatory Visit (INDEPENDENT_AMBULATORY_CARE_PROVIDER_SITE_OTHER): Payer: BLUE CROSS/BLUE SHIELD

## 2015-03-27 DIAGNOSIS — Z0000 Encounter for general adult medical examination without abnormal findings: Secondary | ICD-10-CM | POA: Diagnosis not present

## 2015-03-27 LAB — LIPID PANEL
CHOLESTEROL: 224 mg/dL — AB (ref 0–200)
HDL: 51.8 mg/dL (ref >39.00)
LDL Cholesterol: 148 mg/dL — ABNORMAL HIGH (ref 0–99)
NonHDL: 172.2
TRIGLYCERIDES: 123 mg/dL (ref 0.0–149.0)
Total CHOL/HDL Ratio: 4
VLDL: 24.6 mg/dL (ref 0.0–40.0)

## 2015-03-27 LAB — COMPREHENSIVE METABOLIC PANEL
ALT: 37 U/L (ref 0–53)
AST: 23 U/L (ref 0–37)
Albumin: 4.1 g/dL (ref 3.5–5.2)
Alkaline Phosphatase: 52 U/L (ref 39–117)
BUN: 13 mg/dL (ref 6–23)
CALCIUM: 9.2 mg/dL (ref 8.4–10.5)
CHLORIDE: 105 meq/L (ref 96–112)
CO2: 26 mEq/L (ref 19–32)
CREATININE: 1.02 mg/dL (ref 0.40–1.50)
GFR: 78.21 mL/min (ref >60.00)
Glucose, Bld: 89 mg/dL (ref 70–99)
POTASSIUM: 4.1 meq/L (ref 3.5–5.1)
Sodium: 142 mEq/L (ref 135–145)
Total Bilirubin: 0.7 mg/dL (ref 0.2–1.2)
Total Protein: 6.9 g/dL (ref 6.0–8.3)

## 2015-03-27 LAB — CBC WITH DIFFERENTIAL/PLATELET
BASOS PCT: 0.5 % (ref 0.0–3.0)
Basophils Absolute: 0.1 10*3/uL (ref 0.0–0.1)
Eosinophils Absolute: 0.3 10*3/uL (ref 0.0–0.7)
Eosinophils Relative: 2.8 % (ref 0.0–5.0)
HCT: 47.8 % (ref 39.0–52.0)
Hemoglobin: 15.9 g/dL (ref 13.0–17.0)
Lymphocytes Relative: 43.7 % (ref 12.0–46.0)
Lymphs Abs: 5.1 10*3/uL — ABNORMAL HIGH (ref 0.7–4.0)
MCHC: 33.3 g/dL (ref 30.0–36.0)
MCV: 97.7 fl (ref 78.0–100.0)
MONOS PCT: 8.3 % (ref 3.0–12.0)
Monocytes Absolute: 1 10*3/uL (ref 0.1–1.0)
Neutro Abs: 5.2 10*3/uL (ref 1.4–7.7)
Neutrophils Relative %: 44.7 % (ref 43.0–77.0)
Platelets: 284 10*3/uL (ref 150.0–400.0)
RBC: 4.89 Mil/uL (ref 4.22–5.81)
RDW: 14.7 % (ref 11.5–15.5)
WBC: 11.6 10*3/uL — ABNORMAL HIGH (ref 4.0–10.5)

## 2015-03-27 LAB — POCT URINALYSIS DIPSTICK
BILIRUBIN UA: NEGATIVE
Blood, UA: NEGATIVE
Glucose, UA: NEGATIVE
KETONES UA: NEGATIVE
LEUKOCYTES UA: NEGATIVE
Nitrite, UA: NEGATIVE
PROTEIN UA: NEGATIVE
SPEC GRAV UA: 1.025
Urobilinogen, UA: 0.2
pH, UA: 5.5

## 2015-03-27 LAB — TSH: TSH: 1.67 u[IU]/mL (ref 0.35–4.50)

## 2015-03-27 LAB — PSA: PSA: 1.01 ng/mL (ref 0.10–4.00)

## 2015-03-27 LAB — HEPATIC FUNCTION PANEL
ALT: 37 U/L (ref 0–53)
AST: 23 U/L (ref 0–37)
Albumin: 4.1 g/dL (ref 3.5–5.2)
Alkaline Phosphatase: 52 U/L (ref 39–117)
Bilirubin, Direct: 0.1 mg/dL (ref 0.0–0.3)
Total Bilirubin: 0.7 mg/dL (ref 0.2–1.2)
Total Protein: 6.9 g/dL (ref 6.0–8.3)

## 2015-04-02 ENCOUNTER — Ambulatory Visit (INDEPENDENT_AMBULATORY_CARE_PROVIDER_SITE_OTHER): Payer: BLUE CROSS/BLUE SHIELD | Admitting: Internal Medicine

## 2015-04-02 ENCOUNTER — Encounter: Payer: Self-pay | Admitting: Internal Medicine

## 2015-04-02 VITALS — BP 120/80 | HR 80 | Temp 98.1°F | Ht 66.0 in | Wt 182.0 lb

## 2015-04-02 DIAGNOSIS — Z0000 Encounter for general adult medical examination without abnormal findings: Secondary | ICD-10-CM | POA: Diagnosis not present

## 2015-04-02 DIAGNOSIS — C8590 Non-Hodgkin lymphoma, unspecified, unspecified site: Secondary | ICD-10-CM

## 2015-04-02 DIAGNOSIS — K5900 Constipation, unspecified: Secondary | ICD-10-CM | POA: Diagnosis not present

## 2015-04-02 DIAGNOSIS — Z9081 Acquired absence of spleen: Secondary | ICD-10-CM | POA: Diagnosis not present

## 2015-04-02 DIAGNOSIS — Z23 Encounter for immunization: Secondary | ICD-10-CM

## 2015-04-02 HISTORY — DX: Acquired absence of spleen: Z90.81

## 2015-04-02 NOTE — Assessment & Plan Note (Addendum)
Reviewed adult health maintenance protocols.  Patient updated with Prevnar and meningitis vaccine considering history of splenectomy.  Plan to give pneumovax again in 2017.  He needs to return in 2 months for repeat meningitis vaccine.  Refer to GI for colonoscopy.  Limitations of prostate cancer screening discussed with patient.  PSA and DRE normal.

## 2015-04-02 NOTE — Assessment & Plan Note (Signed)
Patient declines Miralax.  His wife using dietary changes to help with intermittent constipation.  Refer to GI for colonoscopy.

## 2015-04-02 NOTE — Progress Notes (Signed)
Subjective:    Patient ID: [name] [name], male    DOB: 04-02-1951, 64 y.o.   MRN: [id number]  HPI  64 year old white male with history of non-Hodgkin's lymphoma in remission, Parkinson's disease and history of  splenectomy for routine physical.  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 Patient stays fairly active taking care of grandchildren and hobbies.  Patient complains of intermittent constipation. He has mild associated right upper quadrant pain.  Patient has never had colon cancer screening / colonoscopy.  Patient released by Dr. Marin Olp.  His CBCD reviewed.  He has minimal leukocytosis.  Other lab results reviewed in detail.  Review of Systems  Constitutional: Negative for activity change, appetite change and unexpected weight change.  Eyes: Negative for visual disturbance.  Respiratory: Negative for cough, chest tightness and shortness of breath.   Cardiovascular: Negative for chest pain.  Genitourinary: Negative for difficulty urinating.  Neurological: Negative for headaches.  Gastrointestinal: Negative for abdominal pain, heartburn melena or hematochezia.  Intermittent constipation Psych: Negative for depression or anxiety Endo:  No polyuria or polydypsia        Past Medical History  Diagnosis Date  . Dystonia 1994    diagnosed in [place]  . Non Hodgkin's lymphoma     diffuse- 6 cycles of chemo with R CHOP Rituxan - last dose 10/08  . Cervical lymphadenopathy     right - followed my ENT    History   Social History  . Marital Status: Married    Spouse Name: N/A  . Number of Children: N/A  . Years of Education: N/A   Occupational History  . Not on file.   Social History Main Topics  . Smoking status: Never Smoker   . Smokeless tobacco: Not on file  . Alcohol Use: No  . Drug Use: No  . Sexual Activity: Not on file   Other Topics Concern  . Not on file   Social History Narrative    Past Surgical History  Procedure Laterality Date  .  Splenectomy    . Cholecystectomy, laparoscopic    . Port-a-cath removal    . Portacath placement    . Tonsillectomy    . Eye surgery      x2 as child    Family History  Problem Relation Age of Onset  . Heart disease Mother   . Cancer Mother     lung  . Heart disease Father     No Known Allergies  Current Outpatient Prescriptions on File Prior to Visit  Medication Sig Dispense Refill  . aspirin 81 MG chewable tablet Chew 81 mg by mouth daily.    . Cholecalciferol (VITAMIN D3) 3000 UNITS TABS Take by mouth.    . Pramipexole Dihydrochloride (MIRAPEX ER) 1.5 MG TB24 Take 1 tablet (1.5 mg total) by mouth daily. 90 tablet 3   No current facility-administered medications on file prior to visit.    BP 120/80 mmHg  Pulse 80  Temp(Src) 98.1 F (36.7 C) (Oral)  Ht 5\' 6"  (1.676 m)  Wt 182 lb (82.555 kg)  BMI 29.39 kg/m2    Objective:   Physical Exam  Constitutional: Appears well-developed and well-nourished. No distress.  Head: Normocephalic and atraumatic.  Ear:  Right and left ear normal.  TMs clear.  Hearing is grossly normal Mouth/Throat: Oropharynx is clear and moist.  Eyes: Conjunctivae are normal. Pupils are equal, round, and reactive to light.  Neck: Normal range of motion. Neck supple. No thyromegaly present. No carotid bruit Cardiovascular:  Normal rate, regular rhythm and normal heart sounds.  Exam reveals no gallop and no friction rub.  No murmur heard. Pulmonary/Chest: Effort normal and breath sounds normal.  No wheezes. No rales.  Abdominal: Soft. Bowel sounds are normal. No mass. There is no tenderness.  GU:  External hemorrhoids, no rectal mass, prostate is smooth without nodules or asymmetry.  Guaiac results negative. Neurological: Alert. No cranial nerve deficit. Resting tremor Skin: Skin is warm and dry.  Psychiatric: Normal mood and affect. Behavior is normal.       Assessment & Plan:

## 2015-04-02 NOTE — Assessment & Plan Note (Addendum)
Update with appropriate vaccines.  Patient given rx for cefuroxime should he develop fever in the future.  He agrees and he will also contact our office.

## 2015-04-02 NOTE — Assessment & Plan Note (Signed)
Previously followed by Dr. Marin Olp.  He is in remission.  Monitor CBCD yearly.

## 2015-04-02 NOTE — Progress Notes (Signed)
Pre visit review using our clinic review tool, if applicable. No additional management support is needed unless otherwise documented below in the visit note. 

## 2015-04-02 NOTE — Patient Instructions (Addendum)
Please return in 2 weeks for additional vaccines (Nurse visit only)

## 2015-04-03 ENCOUNTER — Encounter: Payer: Self-pay | Admitting: Internal Medicine

## 2015-04-04 MED ORDER — CEFUROXIME AXETIL 500 MG PO TABS: ORAL_TABLET | ORAL | Status: DC

## 2015-04-04 NOTE — Addendum Note (Signed)
Addended by: Rosine Abe on: 04/04/2015 01:13 PM   Modules accepted: Orders, SmartSet

## 2015-04-16 ENCOUNTER — Ambulatory Visit: Payer: BLUE CROSS/BLUE SHIELD | Admitting: Internal Medicine

## 2015-04-16 ENCOUNTER — Ambulatory Visit: Payer: BLUE CROSS/BLUE SHIELD | Admitting: *Deleted

## 2015-05-03 ENCOUNTER — Encounter: Payer: Self-pay | Admitting: Neurology

## 2015-05-03 ENCOUNTER — Ambulatory Visit (INDEPENDENT_AMBULATORY_CARE_PROVIDER_SITE_OTHER): Payer: BLUE CROSS/BLUE SHIELD | Admitting: Neurology

## 2015-05-03 VITALS — BP 138/80 | HR 66 | Ht 66.0 in | Wt 183.0 lb

## 2015-05-03 DIAGNOSIS — G20 Parkinson's disease: Secondary | ICD-10-CM | POA: Diagnosis not present

## 2015-05-03 DIAGNOSIS — G244 Idiopathic orofacial dystonia: Secondary | ICD-10-CM | POA: Diagnosis not present

## 2015-05-03 DIAGNOSIS — E538 Deficiency of other specified B group vitamins: Secondary | ICD-10-CM | POA: Diagnosis not present

## 2015-05-03 NOTE — Progress Notes (Signed)
[name] [name] was seen today in the movement disorders clinic for neurologic consultation at the request of [name], DO.  The consultation is for the evaluation of tremor.  This patient is accompanied in the office by his spouse who supplements the history.    The pt was dx with dystonia in [place] in 1994.  He presented to the speech pathologist and was dx with spasmodic dysphonia.  He was then referred to the neurologist who confirmed the diagnosis and did and MRI of the brain that was normal.  He had botox in to the vocal cords and that was helpful.  He would lose the voice for 6 weeks after the injections and then would get the voice back for 6 weeks.  He would go back to [place] twice per year for the injections.  He hasn't had the injections for the last 2 years as he quit working.  The spasmodic dysphonia is worse on the phone.  Last ST was 1995.  Having some trouble with swallowing.  Had developed some movements in the face and tried botox there but didn't really help there.  Has had some neck extension as well with the dystonic movements.  Wife states that these sx's have been better since retirement; he is under less stress.  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  The patient reports that [name] tremor started in the thumb in may and has slowly gotten worse and 3 weeks ago, he noted just a little in the [name].  He notes the tremor most at rest.  09/26/14 update:  The patient returns today for follow-up.  The patient has a history of Meige's syndrome, but presented last time with parkinsonism, but did not meet all the criteria for idiopathic Parkinson's disease.  Nonetheless, the patient wanted to start on medication.  We started him on Mirapex ER and he has worked his way up to 1.5 mg daily.  The patient reports that he is about 50% better.  He states that it has helped tremor but it isn't gone.  His wife thinks that mirapex is disrupting his sleep.   He has constipation and thought that it  was the mirapex.  His balance has been good.   He had an MRI of the brain since last visit and that was unremarkable.  Because of significant dysphagia from his Meige syndrome he had a modified barium swallow on 08/07/2014 and that revealed mild oral dysphagia, moderate pharyngeal phase dysphagia and a regular diet with thin liquids was recommended.   Has a strange sensation under the bottom of the foot like something is under the ball of the foot.  Has had paresthesias off and on for a long time.  Did get chemo with nonHodgkins and been on B6 since.  01/02/15 update:  The patient is following up today, accompanied by his wife is supplements the history.  The patient is on Mirapex ER, 1.5 mg daily.  Overall, the patient states that he is doing well.  No falls.  No hallucinations.  He continues to have issues with dysphagia, but that is primarily from his long history of Meige syndrome.  He continues to not need much sleep at night but it is not transitioning into daytime sleepiness.    05/03/15 update:  The patient returns today for follow-up, accompanied by his wife who supplements the history.  The patient is on pramipexole ER, 1.5 mg daily. He is doing well on it. He states that  he was exercising but he "fell off the wagon."   He was referred last visit for physical and speech therapy. He states that he never got a call in that regard.   He does have a history of Meige syndrome, which is the primarily etiology for the dysphagia and some of the speech issues.  Last visit, I did ask him to add B12 supplementation, as his B12 was on the low end of normal.  He has been taking his B12 supplement faithfully.  He already he has peripheral neuropathy because of his history of chemotherapy.   Pt states that he is sleeping better than last visit.     ALLERGIES:  No Known Allergies  CURRENT MEDICATIONS:  Outpatient Encounter Prescriptions as of 05/03/2015  Medication Sig  . aspirin 81 MG chewable tablet Chew 81 mg  by mouth daily.  . Cholecalciferol (VITAMIN D3) 3000 UNITS TABS Take by mouth.  . cyanocobalamin 100 MCG tablet Take 100 mcg by mouth daily.  . Pramipexole Dihydrochloride (MIRAPEX ER) 1.5 MG TB24 Take 1 tablet (1.5 mg total) by mouth daily.  . [DISCONTINUED] cefUROXime (CEFTIN) 500 MG tablet Take 1 tablet twice daily   No facility-administered encounter medications on file as of 05/03/2015.    PAST MEDICAL HISTORY:   Past Medical History  Diagnosis Date  . Dystonia 1994    diagnosed in [place]  . Non Hodgkin's lymphoma     diffuse- 6 cycles of chemo with R CHOP Rituxan - last dose 10/08  . Cervical lymphadenopathy     right - followed my ENT    PAST SURGICAL HISTORY:   Past Surgical History  Procedure Laterality Date  . Splenectomy    . Cholecystectomy, laparoscopic    . Port-a-cath removal    . Portacath placement    . Tonsillectomy    . Eye surgery      x2 as child    SOCIAL HISTORY:   Social History   Social History  . Marital Status: Married    Spouse Name: N/A  . Number of Children: N/A  . Years of Education: N/A   Occupational History  . Not on file.   Social History Main Topics  . Smoking status: Never Smoker   . Smokeless tobacco: Not on file  . Alcohol Use: No  . Drug Use: No  . Sexual Activity: Not on file   Other Topics Concern  . Not on file   Social History Narrative    FAMILY HISTORY:   Family Status  Relation Status Death Age  . Mother Deceased     heart disease, lung cancer  . Father Deceased     heart attack  . Brother Deceased     colon cancer  . Sister Alive     unknown  . Son Alive     anemia  . Son Alive     healthy  . Daughter Alive     healthy  . Son Deceased     debrancher's enzyme disease  . Son Deceased     debrancher's enzyme disease    ROS:  A complete 10 system review of systems was obtained and was unremarkable apart from what is mentioned above.  PHYSICAL EXAMINATION:    VITALS:   Filed Vitals:    05/03/15 0906  BP: 138/80  Pulse: 66  Height: 5\' 6"  (1.676 m)  Weight: 183 lb (83.008 kg)    GEN:  The patient appears stated age and is in NAD. HEENT:  Normocephalic, atraumatic.  The mucous membranes are moist. The superficial temporal arteries are without ropiness or tenderness. CV:  RRR Lungs:  CTAB Neck/HEME:  There are no carotid bruits bilaterally.  Neurological examination:  Orientation: The patient is alert and oriented x3. Fund of knowledge is appropriate.  Recent and remote memory are intact.  Attention and concentration are normal.    Able to name objects and repeat phrases. Cranial nerves: There is good facial symmetry.  Slight facial hypomimia.  Mild oromandibular dystonia. Pupils are equal round and reactive to light bilaterally. Fundoscopic exam reveals clear margins bilaterally. There is L exotropia (chronic). The visual fields are full to confrontational testing. The speech bulbar in quality and signifcant spasmodic dysphonia.   Soft palate rises symmetrically and there is no tongue deviation. Hearing is intact to conversational tone. Sensation: Sensation is intact to light touch throughout. Motor: Strength is 5/5 in the bilateral upper and lower extremities.   Shoulder shrug is equal and symmetric.  There is no pronator drift.   Movement examination: Tone: There is normal tone in the bilateral upper extremities.  The tone in the lower extremities is normal.  Abnormal movements: There is mild-mod, near constant [name] resting tremor that increases with ambulation and distraction Coordination:  There is no decremation with rapid alternating movements. Gait and Station: The patient has no difficulty arising out of a deep-seated chair without the use of the hands. The patient's stride length is normal.  The patient has a negative pull test.     Lab Results  Component Value Date   VITAMINB12 292 09/26/2014      ASSESSMENT/PLAN:  1.  Parkinsonism  -Pt likely has early  PD, but doesn't meet all criteria yet according to the brain bank criteria, but likely does have early PD. Even though he has prominent facial dystonia (meige syndrome), I don't think that this is related as this started in 1994.  He has found Mirapex to be helpful.   He does think that it has helped tremor but not fully but we both decided it would not be worth increasing the dosage. He has no compulsive side effects with the Mirapex.   -Put in referral again to neurorehab for PT/ST.  Not sure why he didn't get the call after we sent the referral last visit.  -Talked about the importance of resuming exercise.  Talked about the value of safe, cardiovascular exercise.  Greater than 50% of the 25 minute visit in counseling in this regard. 2.  Dysphagia  -primarily related to Meige syndrome.  -he had a modified barium swallow on 08/07/2014 and that revealed mild oral dysphagia, moderate pharyngeal phase dysphagia and a regular diet with thin liquids was recommended. 3.  Paresthesias.  -Probable due to peripheral neuropathy, which is likely due to his history of chemotherapy from non-Hodgkin's lymphoma.    -His B12 has been on the low end, but he has been faithfully taking a supplement.  I was going to recheck his B12 level today, but he recently had lab work done and really does not want to get another blood draw today.  We can recheck in the future when he is due for a blood draw. 4.  Constipation  -He has a copy of rancho recipe 5.  Follow-up in the next few months (4 months), sooner should new neurologic issues arise.

## 2015-05-03 NOTE — Patient Instructions (Signed)
1. You have been referred to Neuro Rehab for physical and speech therapy. They will call you directly to schedule an appointment.  Please call [phone] if you do not hear from them.

## 2015-05-22 ENCOUNTER — Ambulatory Visit (AMBULATORY_SURGERY_CENTER): Payer: Self-pay

## 2015-05-22 VITALS — Ht 66.0 in | Wt 185.0 lb

## 2015-05-22 DIAGNOSIS — Z1211 Encounter for screening for malignant neoplasm of colon: Secondary | ICD-10-CM

## 2015-05-22 MED ORDER — NA SULFATE-K SULFATE-MG SULF 17.5-3.13-1.6 GM/177ML PO SOLN: ORAL | Status: DC

## 2015-05-22 NOTE — Progress Notes (Signed)
Per pt, no allergies to soy or egg products.Pt not taking any weight loss meds or using  O2 at home. 

## 2015-05-23 ENCOUNTER — Encounter: Payer: Self-pay | Admitting: Internal Medicine

## 2015-06-04 ENCOUNTER — Ambulatory Visit: Payer: BLUE CROSS/BLUE SHIELD | Admitting: *Deleted

## 2015-06-05 ENCOUNTER — Ambulatory Visit (AMBULATORY_SURGERY_CENTER): Payer: BLUE CROSS/BLUE SHIELD | Admitting: Internal Medicine

## 2015-06-05 ENCOUNTER — Encounter: Payer: Self-pay | Admitting: Internal Medicine

## 2015-06-05 VITALS — BP 103/66 | HR 74 | Temp 96.8°F | Resp 17 | Ht 66.0 in | Wt 185.0 lb

## 2015-06-05 DIAGNOSIS — D122 Benign neoplasm of ascending colon: Secondary | ICD-10-CM

## 2015-06-05 DIAGNOSIS — K6389 Other specified diseases of intestine: Secondary | ICD-10-CM | POA: Diagnosis not present

## 2015-06-05 DIAGNOSIS — K635 Polyp of colon: Secondary | ICD-10-CM

## 2015-06-05 DIAGNOSIS — Z1211 Encounter for screening for malignant neoplasm of colon: Secondary | ICD-10-CM

## 2015-06-05 DIAGNOSIS — D127 Benign neoplasm of rectosigmoid junction: Secondary | ICD-10-CM

## 2015-06-05 MED ORDER — SODIUM CHLORIDE 0.9 % IV SOLN: 500.0000 mL | INTRAVENOUS | Status: DC

## 2015-06-05 NOTE — Op Note (Signed)
[facility]  [address] [place], [address]   COLONOSCOPY PROCEDURE REPORT  PATIENT: [name] [name], [name] [name]  MR#: [id number] BIRTHDATE: 1951-03-03 , 81  yrs. old GENDER: male ENDOSCOPIST: Jerene Bears, MD REFERRED YI:FOYDXA Shawna Orleans, DO PROCEDURE DATE:  06/05/2015 PROCEDURE:   Colonoscopy, screening, Colonoscopy with snare polypectomy, and Submucosal injection, any substance First Screening Colonoscopy - Avg.  risk and is 50 yrs.  old or older Yes.  Prior Negative Screening - Now for repeat screening. N/A  History of Adenoma - Now for follow-up colonoscopy & has been > or = to 3 yrs.  N/A  Polyps removed today? Yes ASA CLASS:   Class III INDICATIONS:Screening for colonic neoplasia, average risk, 1st colonoscopy. MEDICATIONS: Monitored anesthesia care and Propofol 450 mg IV  DESCRIPTION OF PROCEDURE:   After the risks benefits and alternatives of the procedure were thoroughly explained, informed consent was obtained.  The digital rectal exam revealed no rectal mass.   The LB CF-H180AL Loaner [id number]  endoscope was introduced through the anus and advanced to the cecum, which was identified by both the appendix and ileocecal valve. No adverse events experienced.   The quality of the prep was good.  (Suprep was used) The instrument was then slowly withdrawn as the colon was fully examined. Estimated blood loss is zero unless otherwise noted in this procedure report.   COLON FINDINGS: A sessile polyp measuring 25 mm in size was found in the ascending colon.  Endoscopic mucosal resection was performed in a piecemeal fashion by injecting saline and methylene blue into the submucosa to raise the lesion.  Resection was then performed with snare cautery.  The polyp lifted well after submucosal injection. There was minimal blood loss from the polypectomy which ceased spontaneously.  The polypectomy base was tattooed with Niger ink on either side of the margin.  A sessile polyp  measuring 10 mm in size was found in the proximal ascending colon.  A polypectomy was performed with a cold snare.  The resection was complete, the polyp tissue was completely retrieved and sent to histology.   A sessile polyp measuring 5 mm in size was found in the distal sigmoid colon. A polypectomy was performed with a cold snare.  The resection was complete, the polyp tissue was completely retrieved and sent to histology.   There was moderate diverticulosis noted in the left colon.  Retroflexed views revealed no abnormalities. The time to cecum = 2.1 Withdrawal time = 31.8   The scope was withdrawn and the procedure completed.  COMPLICATIONS: There were no immediate complications.  ENDOSCOPIC IMPRESSION: 1.   Sessile polyp was found in the ascending colon; endoscopic mucosal resection was performed as described above 2.   Sessile polyp was found in the ascending colon; polypectomy was performed with a cold snare 3.   Sessile polyp was found in the distal sigmoid colon; polypectomy was performed with a cold snare 4.   Moderate diverticulosis was noted in the left colon  RECOMMENDATIONS: 1.  Hold Aspirin and all other NSAIDs for 2 weeks. 2.  Await pathology results 3.  High fiber diet 4.  Timing of repeat colonoscopy will be determined by pathology findings, though likely 6 months given piecemeal resection of the large polyp 5.  You will receive a letter within 1-2 weeks with the results of your biopsy as well as final recommendations.  Please call my office if you have not received a letter after 3 weeks.  eSigned:  Lajuan Lines  Javid Kemler, MD 06/05/2015 3:06 PM   cc: [name], DO and The Patient   PATIENT NAME:  [name], [name] MR#: [id number]

## 2015-06-05 NOTE — Progress Notes (Signed)
Patient awakening,vss,report to rn 

## 2015-06-05 NOTE — Progress Notes (Signed)
Called to room to assist during endoscopic procedure.  Patient ID and intended procedure confirmed with present staff. Received instructions for my participation in the procedure from the performing physician.  

## 2015-06-05 NOTE — Patient Instructions (Signed)
YOU HAD AN ENDOSCOPIC PROCEDURE TODAY AT THE [facility]:   Refer to the procedure report that was given to you for any specific questions about what was found during the examination.  If the procedure report does not answer your questions, please call your gastroenterologist to clarify.  If you requested that your care partner not be given the details of your procedure findings, then the procedure report has been included in a sealed envelope for you to review at your convenience later.  YOU SHOULD EXPECT: Some feelings of bloating in the abdomen. Passage of more gas than usual.  Walking can help get rid of the air that was put into your GI tract during the procedure and reduce the bloating. If you had a lower endoscopy (such as a colonoscopy or flexible sigmoidoscopy) you may notice spotting of blood in your stool or on the toilet paper. If you underwent a bowel prep for your procedure, you may not have a normal bowel movement for a few days.  Please Note:  You might notice some irritation and congestion in your nose or some drainage.  This is from the oxygen used during your procedure.  There is no need for concern and it should clear up in a day or so.  SYMPTOMS TO REPORT IMMEDIATELY:   Following lower endoscopy (colonoscopy or flexible sigmoidoscopy):  Excessive amounts of blood in the stool  Significant tenderness or worsening of abdominal pains  Swelling of the abdomen that is new, acute  Fever of 100F or higher   Following upper endoscopy (EGD)  Vomiting of blood or coffee ground material  New chest pain or pain under the shoulder blades  Painful or persistently difficult swallowing  New shortness of breath  Fever of 100F or higher  Black, tarry-looking stools  For urgent or emergent issues, a gastroenterologist can be reached at any hour by calling [phone].   DIET: Your first meal following the procedure should be a small meal and then it is ok to progress to  your normal diet. Heavy or fried foods are harder to digest and may make you feel nauseous or bloated.  Likewise, meals heavy in dairy and vegetables can increase bloating.  Drink plenty of fluids but you should avoid alcoholic beverages for 24 hours.  ACTIVITY:  You should plan to take it easy for the rest of today and you should NOT DRIVE or use heavy machinery until tomorrow (because of the sedation medicines used during the test).    FOLLOW UP: Our staff will call the number listed on your records the next business day following your procedure to check on you and address any questions or concerns that you may have regarding the information given to you following your procedure. If we do not reach you, we will leave a message.  However, if you are feeling well and you are not experiencing any problems, there is no need to return our call.  We will assume that you have returned to your regular daily activities without incident.  If any biopsies were taken you will be contacted by phone or by letter within the next 1-3 weeks.  Please call us at [phone] if you have not heard about the biopsies in 3 weeks.    SIGNATURES/CONFIDENTIALITY: You and/or your care partner have signed paperwork which will be entered into your electronic medical record.  These signatures attest to the fact that that the information above on your After Visit Summary has been reviewed   and is understood.  Full responsibility of the confidentiality of this discharge information lies with you and/or your care-partner.  Polyp, diverticulosis, and high fiber diet information given.  Hold aspirin and all other non steroidal anti inflammatory medications for two weeks.  You will be notified about when to repeat colonoscopy after pathology reports are completed.

## 2015-06-06 ENCOUNTER — Telehealth: Payer: Self-pay | Admitting: *Deleted

## 2015-06-06 NOTE — Telephone Encounter (Signed)
  Follow up Call-  Call back number 06/05/2015  Post procedure Call Back phone  # [phone]  Permission to leave phone message Yes     Patient questions:  Do you have a fever, pain , or abdominal swelling? No. Pain Score  0 *  Have you tolerated food without any problems? Yes.    Have you been able to return to your normal activities? Yes.    Do you have any questions about your discharge instructions: Diet   No. Medications  No. Follow up visit  No.  Do you have questions or concerns about your Care? No.  Actions: * If pain score is 4 or above: No action needed, pain <4.

## 2015-06-11 ENCOUNTER — Ambulatory Visit (INDEPENDENT_AMBULATORY_CARE_PROVIDER_SITE_OTHER): Payer: BLUE CROSS/BLUE SHIELD | Admitting: *Deleted

## 2015-06-11 DIAGNOSIS — Z23 Encounter for immunization: Secondary | ICD-10-CM | POA: Diagnosis not present

## 2015-06-13 ENCOUNTER — Encounter: Payer: Self-pay | Admitting: Internal Medicine

## 2015-06-28 ENCOUNTER — Ambulatory Visit: Payer: BLUE CROSS/BLUE SHIELD | Attending: Neurology | Admitting: Physical Therapy

## 2015-06-28 DIAGNOSIS — R1312 Dysphagia, oropharyngeal phase: Secondary | ICD-10-CM | POA: Insufficient documentation

## 2015-06-28 DIAGNOSIS — R490 Dysphonia: Secondary | ICD-10-CM | POA: Insufficient documentation

## 2015-06-28 DIAGNOSIS — R293 Abnormal posture: Secondary | ICD-10-CM | POA: Insufficient documentation

## 2015-06-28 DIAGNOSIS — R269 Unspecified abnormalities of gait and mobility: Secondary | ICD-10-CM | POA: Diagnosis present

## 2015-06-29 NOTE — Therapy (Signed)
[facility] [address] [place], [place], [address] Phone: [phone]   Fax:  [phone]  Physical Therapy Evaluation  Patient Details  Name: [name] [name] MRN: [id number] Date of Birth: [birth year] Referring Provider:  Ludwig Clarks, DO  Encounter Date: 06/28/2015      PT End of Session - 06/29/15 1152    Visit Number 1   Number of Visits 9   Date for PT Re-Evaluation 08/27/15   Authorization Type BCBS   PT Start Time 1104   PT Stop Time 1149   PT Time Calculation (min) 45 min   Activity Tolerance Patient tolerated treatment well   Behavior During Therapy [facility] for tasks assessed/performed      Past Medical History  Diagnosis Date  . Dystonia 1994    diagnosed in [place]  . Non Hodgkin's lymphoma 2007    diffuse- 6 cycles of chemo with R CHOP Rituxan - last dose 10/08  . Cervical lymphadenopathy     right - followed my ENT  . Parkinson disease     2015    Past Surgical History  Procedure Laterality Date  . Splenectomy    . Cholecystectomy, laparoscopic    . Port-a-cath removal    . Portacath placement    . Tonsillectomy    . Eye surgery      x2 as child    There were no vitals filed for this visit.  Visit Diagnosis:  Posture abnormality  Abnormality of gait      Subjective Assessment - 06/28/15 1110    Subjective Pt is a 64 year old male who presents to OP PT with Parkinson's disease with dystonia in vocal cords, jaw, and eyes.  He has had Botox in the past for the dystonia and he has not had that in the past few years since retirement.  He notes increased tremor in [name] since spring 2015.  He does not specifically note changes in balance or walking, but wife reports not clearing feet from the floor, occasional shuffling. Pt does not report any falls   Patient is accompained by: Family member   Patient Stated Goals Pt's goals for therapy are to stop shuffling and move better.   Currently in  Pain? No/denies            [name] PT Assessment - 06/28/15 1116    Assessment   Medical Diagnosis Parkinson's disease   Onset Date/Surgical Date --  early 2016 PD;    Precautions   Precautions Fall   Balance Screen   Has the patient fallen in the past 6 months No   Has the patient had a decrease in activity level because of a fear of falling?  No   Is the patient reluctant to leave their home because of a fear of falling?  No   Home Environment   Living Environment Private residence   Living Arrangements Spouse/significant other   Type of [place] to enter   Entrance Stairs-Number of Steps 2   Entrance Stairs-Rails None   Home Layout Multi-level;Bed/bath upstairs   Home Equipment --  ramp, stair lift chair (for wife)   Prior Function   Level of Independence Independent with basic ADLs;Independent with household mobility without device;Independent with community mobility without device   Vocation Retired  as Press photographer in 2013   [name] exercise bike at home   Observation/Other Assessments   Focus on Therapeutic Outcomes (FOTO)  Functional  Status Intake score 97; Neuro QOL 56   Posture/Postural Control   Posture/Postural Control Postural limitations   Postural Limitations Rounded Shoulders;Forward head  R shoulder is lower than left   ROM / Strength   AROM / PROM / Strength Strength   Strength   Overall Strength Comments grossly tested at least 4/5 bilateral lower exytremitiy; cogwheeling noted iwth MMT  Rigidity noted with MMT resistance   Transfers   Transfers Sit to Stand;Stand to Sit   Sit to Stand 6: Modified independent (Device/Increase time);Uncontrolled descent;Without upper extremity assist;From chair/3-in-1   Five time sit to stand comments  17.68 sec   Stand to Sit 6: Modified independent (Device/Increase time);Without upper extremity assist;To chair/3-in-1;Uncontrolled descent   Ambulation/Gait   Ambulation/Gait Yes    Ambulation/Gait Assistance 6: Modified independent (Device/Increase time)   Ambulation Distance (Feet) 700 Feet   Assistive device None   Gait Pattern Step-through pattern;Decreased arm swing - right;Decreased arm swing - left;Decreased step length - right;Decreased trunk rotation;Poor foot clearance - right   Ambulation Surface Level;Indoor   Gait velocity 10.80 sec = 3.04 ft/sec   Gait Comments With 3 minute walk test, decreased foot clearance noted with conversation during gait; decreased timing of RLE during swing through phase of gait.   Standardized Balance Assessment   Standardized Balance Assessment Timed Up and Go Test   Timed Up and Go Test   Normal TUG (seconds) 13.91   Manual TUG (seconds) 14.14   Cognitive TUG (seconds) 18.19   High Level Balance   High Level Balance Comments Posterior push and release test >2 steps with therapist assist to regain balance   Functional Gait  Assessment   Gait assessed  Yes   Gait Level Surface Walks 20 ft in less than 7 sec but greater than 5.5 sec, uses assistive device, slower speed, mild gait deviations, or deviates 6-10 in outside of the 12 in walkway width.   Change in Gait Speed Able to smoothly change walking speed without loss of balance or gait deviation. Deviate no more than 6 in outside of the 12 in walkway width.   Gait with Horizontal Head Turns Performs head turns smoothly with no change in gait. Deviates no more than 6 in outside 12 in walkway width   Gait with Vertical Head Turns Performs head turns with no change in gait. Deviates no more than 6 in outside 12 in walkway width.   Gait and Pivot Turn Pivot turns safely within 3 sec and stops quickly with no loss of balance.   Step Over Obstacle Is able to step over one shoe box (4.5 in total height) without changing gait speed. No evidence of imbalance.   Gait with Narrow Base of Support Is able to ambulate for 10 steps heel to toe with no staggering.   Gait with Eyes Closed Walks  20 ft, slow speed, abnormal gait pattern, evidence for imbalance, deviates 10-15 in outside 12 in walkway width. Requires more than 9 sec to ambulate 20 ft.  9.69 sec   Ambulating Backwards Walks 20 ft, uses assistive device, slower speed, mild gait deviations, deviates 6-10 in outside 12 in walkway width.  10.55 sec in 20 ft (1.9 ft/sec)   Steps Alternating feet, must use rail.   Total Score 24   FGA comment: Scores <22/30 indicate increased fall risk  PT Long Term Goals - 06/29/15 1207    PT LONG TERM GOAL #1   Title Pt will perform HEP for Parkinson's specific exercises to address posture, balance, gait training for improved functional mobility.  TARGET 07/28/15   Time 4   Period Weeks   Status New   PT LONG TERM GOAL #2   Title Pt will improve 5x sit<>stand to less than or equal to 15 seconds for improved efficiency and safety with transfers.   Time 4   Period Weeks   Status New   PT LONG TERM GOAL #3   Title Pt will improve TUG cognitive score to less than or equal to 15 seconds for improved dual tasking with gait.   Time 4   Period Weeks   Status New   PT LONG TERM GOAL #4   Title Pt will demonstrate improved balance by taking less than 2 steps in posterior push and release test for improved balance recovery.   Time 4   Period Weeks   Status New   PT LONG TERM GOAL #5   Title Pt will verbalize understanding of local Parkinson's disease resources.   Time 4   Period Weeks   Status New   Additional Long Term Goals   Additional Long Term Goals Yes   PT LONG TERM GOAL #6   Title Pt will verbalize plans for continued community fitness upon D/C from PT.   Time 4   Period Weeks   Status New               Plan - 06/29/15 1155    Clinical Impression Statement Pt is a 64 year old male who presents to OP PT with Parkinson's disease, Meige syndrome, who was referred to PT by his movement disorders specialist.  He was  diagnosed with Parkinsonism within the past year.  Wife is noting decreased foot clearance with gait, especially later in the day wtih fatigue.  Pt is at fall risk per TUG cognitive score and per push and release test requiring therapist assist to regain balance in posterior direction.  Pt is desiring therapy specific exercises to slow the progression of Parkinson's and to keep his current level of functional mobility.  Pt presents with postural abnormalities, decreased timing and coordination of gait, bradykinesia with transfers.  Pt will benefit from further skilled physical therapy to address posture, balance, transfers, and gait training.   Pt will benefit from skilled therapeutic intervention in order to improve on the following deficits Abnormal gait;Decreased balance;Decreased mobility;Difficulty walking;Postural dysfunction   Rehab Potential Good   PT Frequency 2x / week   PT Duration 4 weeks  plus evaluation   PT Treatment/Interventions ADLs/Self Care Home Management;Therapeutic exercise;Therapeutic activities;Functional mobility training;Balance training;Neuromuscular re-education;Gait training;Patient/family education   PT Next Visit Plan Initiate HEP-PWR! Moves in sitting, standing, gait activites to address arm swing and step length; postural re-education   Consulted and Agree with Plan of Care Patient;Family member/caregiver   Family Member Consulted wife         Problem List Patient Active Problem List   Diagnosis Date Noted  . Preventative health care 04/02/2015  . Constipation 04/02/2015  . 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 Dysphagia, neurologic 07/27/2014  . Paralysis agitans ([place]) 07/27/2014  . Dystonia 07/10/2014  . Low back pain 11/21/2011  . TRANSAMINASES, SERUM, ELEVATED 07/06/2008  . DIARRHEA 05/23/2008  . LYMPH NODE-ENLARGED 07/28/2007  . Non-Hodgkin lymphoma ([place]) 03/23/2007  Hriday Stai W. 06/29/2015, 12:16 PM  Frazier Butt., PT  [facility] [address] [place], [place], [address] Phone: [phone]   Fax:  [phone]

## 2015-07-03 ENCOUNTER — Ambulatory Visit: Payer: BLUE CROSS/BLUE SHIELD | Admitting: Physical Therapy

## 2015-07-03 DIAGNOSIS — R269 Unspecified abnormalities of gait and mobility: Secondary | ICD-10-CM

## 2015-07-03 DIAGNOSIS — R293 Abnormal posture: Secondary | ICD-10-CM | POA: Diagnosis not present

## 2015-07-03 NOTE — Therapy (Signed)
[facility] [address] [place], [place], [address] Phone: [phone]   Fax:  [phone]  Physical Therapy Treatment  Patient Details  Name: [name] [name] MRN: [id number] Date of Birth: 1951-05-17 Referring Provider:  Rosine Abe, DO  Encounter Date: 07/03/2015      PT End of Session - 07/03/15 1418    Visit Number 2   Number of Visits 9   Date for PT Re-Evaluation 08/27/15   Authorization Type BCBS   PT Start Time 1318   PT Stop Time 1400   PT Time Calculation (min) 42 min   Activity Tolerance Patient tolerated treatment well   Behavior During Therapy WFL for tasks assessed/performed      Past Medical History  Diagnosis Date  . Dystonia 1994    diagnosed in [place]  . Non Hodgkin's lymphoma 2007    diffuse- 6 cycles of chemo with R CHOP Rituxan - last dose 10/08  . Cervical lymphadenopathy     right - followed my ENT  . Parkinson disease     2015    Past Surgical History  Procedure Laterality Date  . Splenectomy    . Cholecystectomy, laparoscopic    . Port-a-cath removal    . Portacath placement    . Tonsillectomy    . Eye surgery      x2 as child    There were no vitals filed for this visit.  Visit Diagnosis:  Posture abnormality  Abnormality of gait      Subjective Assessment - 07/03/15 1321    Subjective No changes since eval   Currently in Pain? No/denies                         [facility] Adult PT Treatment/Exercise - 07/03/15 1406    Ambulation/Gait   Ambulation/Gait Yes   Ambulation/Gait Assistance 7: Independent   Ambulation Distance (Feet) 500 Feet  then 600 ft with walking poles for facilitation of arm swing   Assistive device Other (Comment)  bilateral walking poles to facilitate reciprocal arm swing   Gait Pattern Step-through pattern   Ambulation Surface Level;Indoor   Gait Comments Improved heelstrike, step length, and foot clearance with use of  walking poles for facilitation of arm swing.  Discussed attention to walking pattern-including posture, heelstrike and arm swing           PWR [facility]) - 07/03/15 1322    PWR! exercises Moves in sitting;Moves in standing   PWR! Up x 10    PWR! Rock  x 10   PWR! Twist x 10   PWR Step x 10   Comments Visual and verbal cues required for amplitude, intensity of movement and correct technique   PWR! Up x 10    PWR! Rock x 10    PWR! Twist  x 10    PWR! Step  x 10    Comments Verbal and visual cues for amplitude, intensity and technique     PWR! Up for posture, PWR! Rock for [employer], [place]! Twist for trunk rotation, PWR! Step for step initiation.        PT Education - 07/03/15 1418    Education provided Yes   Education Details HEP-PWR! Moves in sitting and standing   Person(s) Educated Patient;Spouse   Methods Explanation;Demonstration   Comprehension Verbalized understanding;Returned demonstration             PT Long Term Goals - 06/29/15 1207  PT LONG TERM GOAL #1   Title Pt will perform HEP for Parkinson's specific exercises to address posture, balance, gait training for improved functional mobility.  TARGET 07/28/15   Time 4   Period Weeks   Status New   PT LONG TERM GOAL #2   Title Pt will improve 5x sit<>stand to less than or equal to 15 seconds for improved efficiency and safety with transfers.   Time 4   Period Weeks   Status New   PT LONG TERM GOAL #3   Title Pt will improve TUG cognitive score to less than or equal to 15 seconds for improved dual tasking with gait.   Time 4   Period Weeks   Status New   PT LONG TERM GOAL #4   Title Pt will demonstrate improved balance by taking less than 2 steps in posterior push and release test for improved balance recovery.   Time 4   Period Weeks   Status New   PT LONG TERM GOAL #5   Title Pt will verbalize understanding of local Parkinson's disease resources.   Time 4   Period Weeks   Status New    Additional Long Term Goals   Additional Long Term Goals Yes   PT LONG TERM GOAL #6   Title Pt will verbalize plans for continued community fitness upon D/C from PT.   Time 4   Period Weeks   Status New               Plan - 07/03/15 1419    Clinical Impression Statement HEP initiated today for PWR! Moves in sitting and standing positions for posture, weightshifting, trunk rotation and step initiation.  With gait activities and use of walking poles, pt improved step length, foot clearance, heelstrike and posture.  Pt reports noting improved posture with gait.  Pt will benefit from continued therapy to focus on posture, balance and gait.   Pt will benefit from skilled therapeutic intervention in order to improve on the following deficits Abnormal gait;Decreased balance;Decreased mobility;Difficulty walking;Postural dysfunction   Rehab Potential Good   PT Frequency 2x / week   PT Duration 4 weeks  plus evaluation   PT Treatment/Interventions ADLs/Self Care Home Management;Therapeutic exercise;Therapeutic activities;Functional mobility training;Balance training;Neuromuscular re-education;Gait training;Patient/family education   PT Next Visit Plan Review PWR! Moves in sitting and standing; gait activities for arm swing and step length; forward/backward step and reach activities for balance strategy work   [employer] and Agree with Plan of Care Patient;Family member/caregiver   Family Member Consulted wife        Problem List Patient Active Problem List   Diagnosis Date Noted  . Preventative health care 04/02/2015  . Constipation 04/02/2015  . 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 Dysphagia, neurologic 07/27/2014  . Paralysis agitans ([place]) 07/27/2014  . Dystonia 07/10/2014  . Low back pain 11/21/2011  . TRANSAMINASES, SERUM, ELEVATED 07/06/2008  . DIARRHEA 05/23/2008  . LYMPH NODE-ENLARGED 07/28/2007  .  Non-Hodgkin lymphoma ([place]) 03/23/2007    Triston Lisanti W. 07/03/2015, 2:23 PM Frazier Butt., PT [facility] [address] [place], [place], [address] Phone: [phone]   Fax:  [phone]

## 2015-07-03 NOTE — Patient Instructions (Signed)
Provided patient with PWR! Based handout on PWR! Moves Basic 4 in sitting and standing:  PWR! Up x 20  PWR! Rock x 20  PWR! Twist x 20  PWR! Step x 20  To be performed once daily. Provided patient/wife with website for [facility] PWR! Moves to use as adjunct to performing HEP

## 2015-07-04 ENCOUNTER — Ambulatory Visit: Payer: BLUE CROSS/BLUE SHIELD | Admitting: Physical Therapy

## 2015-07-04 DIAGNOSIS — R293 Abnormal posture: Secondary | ICD-10-CM | POA: Diagnosis not present

## 2015-07-04 DIAGNOSIS — R269 Unspecified abnormalities of gait and mobility: Secondary | ICD-10-CM

## 2015-07-05 NOTE — Therapy (Signed)
[facility] [address] [place], [place], [address] Phone: [phone]   Fax:  [phone]  Physical Therapy Treatment  Patient Details  Name: [name] [name] MRN: [id number] Date of Birth: [birth year] Referring Provider:  Rosine Abe, DO  Encounter Date: 07/04/2015      PT End of Session - 07/05/15 0851    Visit Number 3   Number of Visits 9   Date for PT Re-Evaluation 08/27/15   Authorization Type BCBS   PT Start Time 1104   PT Stop Time 1143   PT Time Calculation (min) 39 min   Activity Tolerance Patient tolerated treatment well   Behavior During Therapy [facility] for tasks assessed/performed      Past Medical History  Diagnosis Date  . Dystonia 1994    diagnosed in [place]  . Non Hodgkin's lymphoma 2007    diffuse- 6 cycles of chemo with R CHOP Rituxan - last dose 10/08  . Cervical lymphadenopathy     right - followed my ENT  . Parkinson disease     2015    Past Surgical History  Procedure Laterality Date  . Splenectomy    . Cholecystectomy, laparoscopic    . Port-a-cath removal    . Portacath placement    . Tonsillectomy    . Eye surgery      x2 as child    There were no vitals filed for this visit.  Visit Diagnosis:  Posture abnormality  Abnormality of gait      Subjective Assessment - 07/04/15 1104    Subjective Have a little soreness with the exercises; tried them a few times at home yesterday.   Currently in Pain? Yes   Pain Score 1    Pain Location Shoulder  arms   Pain Orientation Right;Left   Pain Descriptors / Indicators Aching  muscle use   Pain Type Acute pain   Pain Onset In the past 7 days   Aggravating Factors  Exercises have brought on muscles   Pain Relieving Factors Not tried anything-"not that big of a deal"                         [facility] Adult PT Treatment/Exercise - 07/04/15 1136    Transfers   Transfers Sit to Stand;Stand to Sit   Sit to  Stand 6: Modified independent (Device/Increase time);From elevated surface;From chair/3-in-1;Without upper extremity assist   Stand to Sit 6: Modified independent (Device/Increase time);Without upper extremity assist;To elevated surface;To chair/3-in-1   Number of Reps 10 reps;Other sets (comment)  3 sets, from 20 inch mat, 18 in chair, 16 in. chair   Transfer Cueing needs cues for flexion at hips>squat to sit technique for improved control of stand>sit; cues for incr. forward lean with sit>stand   High Level Balance   High Level Balance Activities Side stepping;Marching forwards;Marching backwards;Backward walking   High Level Balance Comments Forward/backward walk, the above exercises with added coordination of UEs with activities  Gait x 400 ft-cues for incr. step length, arm swing, posture           PWR [facility]) - 07/04/15 1108    PWR! Up x 10   PWR! Rock  x 20    PWR! Twist x 20    PWR Step  x 20   Comments Forward step and weightshift x 10 reps with coordinated arm movement, back step and weigthshift x 10 reps with UE support at chair; pt  needs cues to widen BOS with return to static stance as he has narrow BOS with several lateral losses of balance   PWR! Up x 10    PWR! Rock x 20    PWR! Twist x 20    PWR! Step x 20    Basic 4 Flow --     PWR! Moves Basic 4 performed in sitting and standing as review of HEP.  Pt requires min verbal cues for technique, deliberate movement patterns.  PWR! Up performed for posture, PWR! Rock performed for [employer], [place]! Twist performed for trunk rotation, PWR! Step for step inititation.             PT Long Term Goals - 06/29/15 1207    PT LONG TERM GOAL #1   Title Pt will perform HEP for Parkinson's specific exercises to address posture, balance, gait training for improved functional mobility.  TARGET 07/28/15   Time 4   Period Weeks   Status New   PT LONG TERM GOAL #2   Title Pt will improve 5x sit<>stand to less than or equal  to 15 seconds for improved efficiency and safety with transfers.   Time 4   Period Weeks   Status New   PT LONG TERM GOAL #3   Title Pt will improve TUG cognitive score to less than or equal to 15 seconds for improved dual tasking with gait.   Time 4   Period Weeks   Status New   PT LONG TERM GOAL #4   Title Pt will demonstrate improved balance by taking less than 2 steps in posterior push and release test for improved balance recovery.   Time 4   Period Weeks   Status New   PT LONG TERM GOAL #5   Title Pt will verbalize understanding of local Parkinson's disease resources.   Time 4   Period Weeks   Status New   Additional Long Term Goals   Additional Long Term Goals Yes   PT LONG TERM GOAL #6   Title Pt will verbalize plans for continued community fitness upon D/C from PT.   Time 4   Period Weeks   Status New               Plan - 07/05/15 0851    Clinical Impression Statement Pt is performing HEP well, with minimal cues for technique and controlled, deliberate movement patterns.  Pt is able to incorporate eyes and head turns during exercises as well.  Pt does experience several minor losses of balance during forward/back step and weightshifting activities and will continue to benefit from skilled PT to address balance, gait, functional activities.   Pt will benefit from skilled therapeutic intervention in order to improve on the following deficits Abnormal gait;Decreased balance;Decreased mobility;Difficulty walking;Postural dysfunction   Rehab Potential Good   PT Frequency 2x / week   PT Duration 4 weeks  plus evaluation   PT Treatment/Interventions ADLs/Self Care Home Management;Therapeutic exercise;Therapeutic activities;Functional mobility training;Balance training;Neuromuscular re-education;Gait training;Patient/family education   PT Next Visit Plan gait activities for arm swing, posture, step length, forward/back step and reach (add to HEP as apporpriate); variable  direction step and weightshift; posture exercises   Consulted and Agree with Plan of Care Patient;Family member/caregiver   Family Member Consulted wife        Problem List Patient Active Problem List   Diagnosis Date Noted  . Preventative health care 04/02/2015  . Constipation 04/02/2015  . 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 Meige syndrome (  blepharospasm with oromandibular dystonia) 07/27/2014  . Dysphagia, neurologic 07/27/2014  . Paralysis agitans ([place]) 07/27/2014  . Dystonia 07/10/2014  . Low back pain 11/21/2011  . TRANSAMINASES, SERUM, ELEVATED 07/06/2008  . DIARRHEA 05/23/2008  . LYMPH NODE-ENLARGED 07/28/2007  . Non-Hodgkin lymphoma ([place]) 03/23/2007    Azaiah Licciardi W. 07/05/2015, 8:57 AM Frazier Butt., PT [facility] [address] [place], [place], [address] Phone: [phone]   Fax:  [phone]

## 2015-07-09 ENCOUNTER — Ambulatory Visit: Payer: BLUE CROSS/BLUE SHIELD | Admitting: Physical Therapy

## 2015-07-09 DIAGNOSIS — R293 Abnormal posture: Secondary | ICD-10-CM | POA: Diagnosis not present

## 2015-07-09 DIAGNOSIS — R269 Unspecified abnormalities of gait and mobility: Secondary | ICD-10-CM

## 2015-07-09 NOTE — Therapy (Signed)
[facility] [address] [place], [place], [address] Phone: [phone]   Fax:  [phone]  Physical Therapy Treatment  Patient Details  Name: [name] [name] MRN: [id number] Date of Birth: 08-13-1951 No Data Recorded  Encounter Date: 07/09/2015      PT End of Session - 07/09/15 1321    Visit Number 4   Number of Visits 9   Date for PT Re-Evaluation 08/27/15   Authorization Type BCBS   PT Start Time 1105   PT Stop Time 1147   PT Time Calculation (min) 42 min   Activity Tolerance Patient tolerated treatment well   Behavior During Therapy [facility] for tasks assessed/performed      Past Medical History  Diagnosis Date  . Dystonia 1994    diagnosed in [place]  . Non Hodgkin's lymphoma 2007    diffuse- 6 cycles of chemo with R CHOP Rituxan - last dose 10/08  . Cervical lymphadenopathy     right - followed my ENT  . Parkinson disease     2015    Past Surgical History  Procedure Laterality Date  . Splenectomy    . Cholecystectomy, laparoscopic    . Port-a-cath removal    . Portacath placement    . Tonsillectomy    . Eye surgery      x2 as child    There were no vitals filed for this visit.  Visit Diagnosis:  Abnormality of gait  Posture abnormality      Subjective Assessment - 07/09/15 1107    Subjective Soreness getting better from exercises.  Denies falls or changesj   Patient is accompained by: Family member   Patient Stated Goals Pt's goals for therapy are to stop shuffling and move better.   Currently in Pain? No/denies        Forward, backward and side step weight shifting at counter with cues for intensity and incorporating UE movement as well.  Performed varied direction stepping adding cognitive component by following written directions. Cues for intensity and posture.  Gait on treadmill x 6 minutes working on step length, heel strike and cadence at 2.5.  Gait on treadmill kicking blue  therapy ball to encourage step length and heel strike.  Also added cognitive component while on treadmill.  No change in gait pattern with cognitive component.  Gait on level ground with initially PTA assisting with arm swing using walking poles then without.  Pt able to improve speed and arm swing.  No episodes of feet catching.  Posture against wall x 2 minutes.  Provided as HEP.  Also performed seated in chair.         PT Long Term Goals - 06/29/15 1207    PT LONG TERM GOAL #1   Title Pt will perform HEP for Parkinson's specific exercises to address posture, balance, gait training for improved functional mobility.  TARGET 07/28/15   Time 4   Period Weeks   Status New   PT LONG TERM GOAL #2   Title Pt will improve 5x sit<>stand to less than or equal to 15 seconds for improved efficiency and safety with transfers.   Time 4   Period Weeks   Status New   PT LONG TERM GOAL #3   Title Pt will improve TUG cognitive score to less than or equal to 15 seconds for improved dual tasking with gait.   Time 4   Period Weeks   Status New   PT LONG TERM GOAL #  4   Title Pt will demonstrate improved balance by taking less than 2 steps in posterior push and release test for improved balance recovery.   Time 4   Period Weeks   Status New   PT LONG TERM GOAL #5   Title Pt will verbalize understanding of local Parkinson's disease resources.   Time 4   Period Weeks   Status New   Additional Long Term Goals   Additional Long Term Goals Yes   PT LONG TERM GOAL #6   Title Pt will verbalize plans for continued community fitness upon D/C from PT.   Time 4   Period Weeks   Status New               Plan - 07/09/15 1322    Clinical Impression Statement Pt continues with forward head/neck posture and requires cues to improve posture.  Pt with increased walking speed and arm swing today during session.  Needs cues for intensity with weight shifting/stepping but did well with cognitive  component added in.  Continue PT per POC.   Pt will benefit from skilled therapeutic intervention in order to improve on the following deficits Abnormal gait;Decreased balance;Decreased mobility;Difficulty walking;Postural dysfunction   Rehab Potential Good   PT Frequency 2x / week   PT Duration 4 weeks  plus evaluation   PT Treatment/Interventions ADLs/Self Care Home Management;Therapeutic exercise;Therapeutic activities;Functional mobility training;Balance training;Neuromuscular re-education;Gait training;Patient/family education   PT Next Visit Plan Revew forward/back step and reach and add to HEP as apporpriate, provide handout on optimal community fitness.   Consulted and Agree with Plan of Care Patient;Family member/caregiver   Family Member Consulted wife        Problem List Patient Active Problem List   Diagnosis Date Noted  . Preventative health care 04/02/2015  . Constipation 04/02/2015  . 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 Dysphagia, neurologic 07/27/2014  . Paralysis agitans ([place]) 07/27/2014  . Dystonia 07/10/2014  . Low back pain 11/21/2011  . TRANSAMINASES, SERUM, ELEVATED 07/06/2008  . DIARRHEA 05/23/2008  . LYMPH NODE-ENLARGED 07/28/2007  . Non-Hodgkin lymphoma ([place]) 03/23/2007    Narda Bonds 07/09/2015, 1:25 PM  [facility] [address] [place], [place], [address] Phone: [phone]   Fax:  [phone]  Name: [name] [name] MRN: [id number] Date of Birth: Jan 23, 1951   Narda Bonds, [place] 07/09/2015 1:25 PM Phone: [phone] Fax: [phone]

## 2015-07-09 NOTE — Patient Instructions (Signed)
Posture - Standing    Good posture is important. Avoid slouching and forward head thrust. Maintain curve in low back and align ears over shoul- ders, hips over ankles.   For exercise-back up to feet against wall or door frame.  Straighten up till bottom touches wall, then scapular then head if able.  Can use pillow behind head if needed.  Remember not to tilt head back.  You should push it flat back against wall.   Copyright  VHI. All rights reserved.

## 2015-07-12 ENCOUNTER — Ambulatory Visit: Payer: BLUE CROSS/BLUE SHIELD | Admitting: Physical Therapy

## 2015-07-12 DIAGNOSIS — R293 Abnormal posture: Secondary | ICD-10-CM

## 2015-07-12 DIAGNOSIS — R269 Unspecified abnormalities of gait and mobility: Secondary | ICD-10-CM

## 2015-07-12 NOTE — Patient Instructions (Signed)

## 2015-07-13 NOTE — Therapy (Signed)
[facility] [address] [place], [place], [address] Phone: [phone]   Fax:  [phone]  Physical Therapy Treatment  Patient Details  Name: [name] [name] MRN: [id number] Date of Birth: [birth year] No Data Recorded  Encounter Date: 07/12/2015      PT End of Session - 07/13/15 1327    Visit Number 5   Number of Visits 9   Date for PT Re-Evaluation 08/27/15   Authorization Type BCBS   PT Start Time 1018   PT Stop Time 1100   PT Time Calculation (min) 42 min   Activity Tolerance Patient tolerated treatment well   Behavior During Therapy [facility] for tasks assessed/performed      Past Medical History  Diagnosis Date  . Dystonia 1994    diagnosed in [place]  . Non Hodgkin's lymphoma 2007    diffuse- 6 cycles of chemo with R CHOP Rituxan - last dose 10/08  . Cervical lymphadenopathy     right - followed my ENT  . Parkinson disease     2015    Past Surgical History  Procedure Laterality Date  . Splenectomy    . Cholecystectomy, laparoscopic    . Port-a-cath removal    . Portacath placement    . Tonsillectomy    . Eye surgery      x2 as child    There were no vitals filed for this visit.  Visit Diagnosis:  Posture abnormality  Abnormality of gait      Subjective Assessment - 07/12/15 1023    Subjective New soreness in my back this week.   Currently in Pain? Yes   Pain Score 1    Pain Location Back   Pain Orientation Right   Pain Descriptors / Indicators Aching  muscle soreness   Pain Type Acute pain   Pain Onset In the past 7 days   Aggravating Factors  Exercises have brought on muscle soreness   Pain Relieving Factors Getting better with time                         [facility] Adult PT Treatment/Exercise - 07/13/15 0001    Ambulation/Gait   Ambulation/Gait Yes   Ambulation/Gait Assistance 7: Independent   Ambulation Distance (Feet) 400 Feet  then 800 ft outdoors   Assistive device None  attempted walking poles; pt has difficulty sequencing them   Gait Pattern Step-through pattern  improved arm swing and step length with cues   Ambulation Surface Indoor;Level;Unlevel;Outdoor   Exercises   Exercises Knee/Hip   Knee/Hip Exercises: Aerobic   Stepper Seated stepper Level 1.8>2.2 with 4 extremities, RPM 80>130 during 5 minute trial.  Used trial to discuss intensity of exercise, including self-rated exertion scale.     Self Care information provided on optimal fitness upon discharge, as well as possibility of cycling class for PD at [facility]  Discussed walking as exercise at least 3-5 times per week, 20-30 minutes. Discussed aerobic exercise-use of machines at [facility] or bike at home, 3-5 times per week, working up to 20-30 minutes with intensity of 7-8/10.      Pt voices concern over pain when performing posture exercise at wall-instructed pt to avoid that position if it causes pain and to focus on PWR! Up posture position as reminder of optimal posture.        PT Education - 07/13/15 1326    Education provided Yes   Education Details optimal fitness information post  d/c: hold off on wall stretches if causing pain   Person(s) Educated Patient;Spouse   Methods Explanation;Handout;Demonstration   Comprehension Verbalized understanding             PT Long Term Goals - 06/29/15 1207    PT LONG TERM GOAL #1   Title Pt will perform HEP for Parkinson's specific exercises to address posture, balance, gait training for improved functional mobility.  TARGET 07/28/15   Time 4   Period Weeks   Status New   PT LONG TERM GOAL #2   Title Pt will improve 5x sit<>stand to less than or equal to 15 seconds for improved efficiency and safety with transfers.   Time 4   Period Weeks   Status New   PT LONG TERM GOAL #3   Title Pt will improve TUG cognitive score to less than or equal to 15 seconds for improved dual tasking with gait.   Time 4   Period  Weeks   Status New   PT LONG TERM GOAL #4   Title Pt will demonstrate improved balance by taking less than 2 steps in posterior push and release test for improved balance recovery.   Time 4   Period Weeks   Status New   PT LONG TERM GOAL #5   Title Pt will verbalize understanding of local Parkinson's disease resources.   Time 4   Period Weeks   Status New   Additional Long Term Goals   Additional Long Term Goals Yes   PT LONG TERM GOAL #6   Title Pt will verbalize plans for continued community fitness upon D/C from PT.   Time 4   Period Weeks   Status New               Plan - 07/13/15 1332    Clinical Impression Statement Attempted use of walking poles bilaterally today; however, pt has difficulty sequencing them consistently; pt has better awareness of arm swing and posture without use of poles.  Pt demonstrates good understanding of intensity with use of machines.  Will likely be ready for discharge next week.   Pt will benefit from skilled therapeutic intervention in order to improve on the following deficits Abnormal gait;Decreased balance;Decreased mobility;Difficulty walking;Postural dysfunction   Rehab Potential Good   PT Frequency 2x / week   PT Duration 4 weeks  plus evaluation   PT Treatment/Interventions ADLs/Self Care Home Management;Therapeutic exercise;Therapeutic activities;Functional mobility training;Balance training;Neuromuscular re-education;Gait training;Patient/family education   PT Next Visit Plan Revew forward/back step and reach and add to HEP as apporpriate, with addition of forward and back step and weigthshift; likely discharge next week.   Consulted and Agree with Plan of Care Patient;Family member/caregiver   Family Member Consulted wife        Problem List Patient Active Problem List   Diagnosis Date Noted  . Preventative health care 04/02/2015  . Constipation 04/02/2015  . 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 Meige syndrome (blepharospasm  with oromandibular dystonia) 07/27/2014  . Dysphagia, neurologic 07/27/2014  . Paralysis agitans ([place]) 07/27/2014  . Dystonia 07/10/2014  . Low back pain 11/21/2011  . TRANSAMINASES, SERUM, ELEVATED 07/06/2008  . DIARRHEA 05/23/2008  . LYMPH NODE-ENLARGED 07/28/2007  . Non-Hodgkin lymphoma ([place]) 03/23/2007    Marjory Meints W. 07/13/2015, 1:35 PM Frazier Butt., PT [facility] [address] [place], [place], [address] Phone: [phone]   Fax:  [phone]  Name: JAWANN URBANI MRN: [id number] Date of Birth: Jun 05, 1951

## 2015-07-19 ENCOUNTER — Ambulatory Visit: Payer: BLUE CROSS/BLUE SHIELD | Admitting: Physical Therapy

## 2015-07-19 ENCOUNTER — Ambulatory Visit: Payer: BLUE CROSS/BLUE SHIELD | Admitting: Speech Pathology

## 2015-07-19 DIAGNOSIS — R293 Abnormal posture: Secondary | ICD-10-CM

## 2015-07-19 DIAGNOSIS — R1312 Dysphagia, oropharyngeal phase: Secondary | ICD-10-CM

## 2015-07-19 DIAGNOSIS — R269 Unspecified abnormalities of gait and mobility: Secondary | ICD-10-CM

## 2015-07-19 DIAGNOSIS — R490 Dysphonia: Secondary | ICD-10-CM

## 2015-07-19 NOTE — Patient Instructions (Signed)
Provided patient with PWR! Based handout on forward step and reach x 10 reps, backward step and reach x 10 reps (stopping in middle)  -Discussed progression of exercise to be forward/back step and reach (no stopping in the middle)  Provided patient with stagger stance position rock and reach with coordinated arms x 10 reps each position  -exercises to be performed once daily

## 2015-07-19 NOTE — Therapy (Signed)
[facility] [address] [place], [place], [address] Phone: [phone]   Fax:  [phone]  Physical Therapy Treatment  Patient Details  Name: [name] [name] MRN: [id number] Date of Birth: [birth year] No Data Recorded  Encounter Date: 07/19/2015      PT End of Session - 07/19/15 1208    Visit Number 6   Number of Visits 9   Date for PT Re-Evaluation 08/27/15   Authorization Type BCBS   PT Start Time 1103   PT Stop Time 1146   PT Time Calculation (min) 43 min   Activity Tolerance Patient tolerated treatment well   Behavior During Therapy [facility] for tasks assessed/performed      Past Medical History  Diagnosis Date  . Dystonia 1994    diagnosed in [place]  . Non Hodgkin's lymphoma 2007    diffuse- 6 cycles of chemo with R CHOP Rituxan - last dose 10/08  . Cervical lymphadenopathy     right - followed my ENT  . Parkinson disease     2015    Past Surgical History  Procedure Laterality Date  . Splenectomy    . Cholecystectomy, laparoscopic    . Port-a-cath removal    . Portacath placement    . Tonsillectomy    . Eye surgery      x2 as child    There were no vitals filed for this visit.  Visit Diagnosis:  Abnormality of gait  Posture abnormality      Subjective Assessment - 07/19/15 1106    Subjective Not really having the soreness in my back.  No changes since last visit   Currently in Pain? No/denies                         [facility] Adult PT Treatment/Exercise - 07/19/15 1200    Transfers   Transfers Sit to Stand;Stand to Sit   Sit to Stand 7: Independent   Stand to Sit 7: Independent   Number of Reps 10 reps;Other sets (comment)  each, from 24" mat, 18" chair, 16" chair, 16" soft surface   Transfer Cueing For functional strengthening; cues provided for increased forward lean and momentum to initiate transfers   Comments Per pt/wife's question, discussed positioning of  transfer assistance that pt will need to give wife (who has medical condition needing pt's occasional assistance); described wide BOS, keeping weight of object being lifted or wife when transferred close to body.  PT demo positioning-pt/wife decline to try during session   High Level Balance   High Level Balance Comments Forward step and reach x 10 reps, then back step and reach x 10 reps, alternating feet; then forward/back stepping x 5 reps each (no stopping in the middle); stagger stance forward/back weightshift x 10 reps each position with cues for coordinated arm swing   Knee/Hip Exercises: Standing   Forward Step Up Right;Left;1 set;10 reps;Hand Hold: 1;Step Height: 6"  for functional strengthening   Forward Step Up Limitations Forward step up-up/down-down x 10 reps each leg for functional strengthening                PT Education - 07/19/15 1206    Education provided Yes   Education Details Added forward/back step and weightshift and rock and reach anterior/posterior to HEP; discussed positioning for lifting and assistance with wife's transfers   Person(s) Educated Patient;Spouse   Methods Explanation;Demonstration;Handout   Comprehension Verbalized understanding;Returned demonstration  PT Long Term Goals - 06/29/15 1207    PT LONG TERM GOAL #1   Title Pt will perform HEP for Parkinson's specific exercises to address posture, balance, gait training for improved functional mobility.  TARGET 07/28/15   Time 4   Period Weeks   Status New   PT LONG TERM GOAL #2   Title Pt will improve 5x sit<>stand to less than or equal to 15 seconds for improved efficiency and safety with transfers.   Time 4   Period Weeks   Status New   PT LONG TERM GOAL #3   Title Pt will improve TUG cognitive score to less than or equal to 15 seconds for improved dual tasking with gait.   Time 4   Period Weeks   Status New   PT LONG TERM GOAL #4   Title Pt will demonstrate improved  balance by taking less than 2 steps in posterior push and release test for improved balance recovery.   Time 4   Period Weeks   Status New   PT LONG TERM GOAL #5   Title Pt will verbalize understanding of local Parkinson's disease resources.   Time 4   Period Weeks   Status New   Additional Long Term Goals   Additional Long Term Goals Yes   PT LONG TERM GOAL #6   Title Pt will verbalize plans for continued community fitness upon D/C from PT.   Time 4   Period Weeks   Status New               Plan - 07/19/15 1208    Clinical Impression Statement Pt unable to be seen earlier this week due to primary therapist out sick.  Added to/updated HEP for step and weightshift activities, with additional focus today on functional strengthening.  Pt/wife concerned about pt's need to maintain strength for assisting wife, who has progressive medical condition.  Will add functional strengthening exercises to HEP next visit and discuss how to progress/challenge HEP, with plans now to discharge next week.   Pt will benefit from skilled therapeutic intervention in order to improve on the following deficits Abnormal gait;Decreased balance;Decreased mobility;Difficulty walking;Postural dysfunction   Rehab Potential Good   PT Frequency 2x / week   PT Duration 4 weeks  plus evaluation   PT Treatment/Interventions ADLs/Self Care Home Management;Therapeutic exercise;Therapeutic activities;Functional mobility training;Balance training;Neuromuscular re-education;Gait training;Patient/family education   PT Next Visit Plan Review full HEP; add forward step ups and sit<>stand to HEP for functional strengthening   Consulted and Agree with Plan of Care Patient;Family member/caregiver   Family Member Consulted wife        Problem List Patient Active Problem List   Diagnosis Date Noted  . Preventative health care 04/02/2015  . Constipation 04/02/2015  . 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 Meige syndrome  (blepharospasm with oromandibular dystonia) 07/27/2014  . Dysphagia, neurologic 07/27/2014  . Paralysis agitans ([place]) 07/27/2014  . Dystonia 07/10/2014  . Low back pain 11/21/2011  . TRANSAMINASES, SERUM, ELEVATED 07/06/2008  . DIARRHEA 05/23/2008  . LYMPH NODE-ENLARGED 07/28/2007  . Non-Hodgkin lymphoma ([place]) 03/23/2007    Damarys Speir W. 07/19/2015, 12:12 PM  Frazier Butt., PT  [facility] [address] [place], [place], [address] Phone: [phone]   Fax:  [phone]  Name: [name] [name] MRN: [id number] Date of Birth: 19-Apr-1951

## 2015-07-19 NOTE — Patient Instructions (Signed)
  Big breath - very loud AH!! 5x twice a day  Loud reading with loud voice - think project and take big breath at periods  You should feel like you are talking too loud or shouting - that is normal and where you should be talking  Use Sunday school effort

## 2015-07-19 NOTE — Therapy (Signed)
[facility] [address] [place], [place], [address] Phone: [phone]   Fax:  [phone]  Speech Language Pathology Evaluation  Patient Details  Name: [name] [name] MRN: [id number] Date of Birth: 1951-01-14 Referring Provider: Dr. Wells Guiles Tat  Encounter Date: 07/19/2015    Past Medical History  Diagnosis Date  . Dystonia 1994    diagnosed in [place]  . Non Hodgkin's lymphoma 2007    diffuse- 6 cycles of chemo with R CHOP Rituxan - last dose 10/08  . Cervical lymphadenopathy     right - followed my ENT  . Parkinson disease     2015    Past Surgical History  Procedure Laterality Date  . Splenectomy    . Cholecystectomy, laparoscopic    . Port-a-cath removal    . Portacath placement    . Tonsillectomy    . Eye surgery      x2 as child    There were no vitals filed for this visit.  Visit Diagnosis: Oropharyngeal dysphagia  Hypokinetic Parkinsonian dysphonia      Subjective Assessment - 07/19/15 1322    Patient is accompained by: Family member   Currently in Pain? No/denies            SLP Evaluation [facility] - 07/19/15 1322    SLP Visit Information   SLP Received On 07/19/15   Referring Provider Dr. Wells Guiles Tat   Onset Date 1 year ago - dx with PD, 1994 spasmodic dystonia has been progressive   Medical Diagnosis PD   Subjective   Subjective "I've had spasmodic dysphonic for years, now I have volume problems"   General Information   Other Pertinent Information The pt was dx with dystonia in [place] in 1994. He presented to the speech pathologist and was dx with spasmodic dysphonia. He was then referred to the neurologist who confirmed the diagnosis and did and MRI of the brain that was normal. He had botox in to the vocal cords and that was helpful. He would lose the voice for 6 weeks after the injections and then would get the voice back for 6 weeks. He would go back to [place] twice per  year for the injections. He hasn't had the injections for the last 2 years as he quit working. The spasmodic dysphonia is worse on the phone. Last ST was 1995. Having some trouble with swallowing. Had developed some movements in the face and tried botox there but didn't really help there. Has had some neck extension as well with the dystonic movements. Wife states that these sx's have been better since retirement; he is under less stress. 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 Pt has been receiving PT at this facility   Prior Functional Status   Cognitive/Linguistic Baseline Within functional limits   Type of Home House   Available Support Family   Vocation Retired   Associate Professor   Overall Cognitive Status Within Functional Limits for tasks assessed   Oral Motor/Sensory Function   Overall Oral Motor/Sensory Function --  tremor in jaw and tongue from dystonia   Motor Speech   Overall Motor Speech Impaired   Respiration Impaired   Phonation Low vocal intensity   Phonation Impaired   Tension Present Jaw;Neck   Volume Soft;Decibel Level           CLINICAL IMPRESSION:   Mr. Littler presents with moderate hypokinetic dysarthira/dysphonia,and intelligibility complicated by h/o oromandibular dystonia. 8 minutes of conversational speech was reduced today, at  average 64dB (WNL= average 70-72dB) with range of 62-66dB, when a sound level meter was placed 30 cm away from pt's mouth. Overall intelligibility for this listener in a quiet environment was approx 80%. Production of loud /a/ averaged 85dB (range of 80 to 90) and mod cues usually needed for loudness. 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 His loud voice was average of 68 to 70db and pt reported he felt like he was shouting. Pt's spouse reports she has frequent to consistent difficulty hearing and understanding Mr. Beck.  Oral motor assessment revealed jaw and linguial tremor due to dysphonia (pt reports  he has had this for years prior to PD) lingual ROM and  lingual strength are WFL. Labial ROM was WFLand strength was [facility].  Pt rated effort level as "medium"for production of loud /a/ (10=maximal effort). In oral reading tasks, pt was asked to use the same amount of effort as with loud /a/. Loudness average ranged from 68 to 72dB with this increased effort . Pt would benefit from skilled ST in order to improve speech intelligibility and pt's QOL.           ADULT SLP TREATMENT - 07/19/15 1500    Cognitive-Linquistic Treatment   Skilled Treatment Initiated cueing to facilitated breath support and loud /a/ to initiate loud /a/ successfully during home practice. Initially pt required usual mod cues, modeling and instruction. He improved to occasional to usual min cues. Also trained pt on reading aloud at home using strategies and cues for adequte loudness             SLP Short Term Goals - 07/19/15 1355    SLP SHORT TERM GOAL #1   Title Pt will achieve 92dB on loud /a/ over 3 sessions   Time 4   Period Weeks   Status New   SLP SHORT TERM GOAL #2   Title Pt will mainitain average of 70dB during structured speech tasks and sentence level utteraces with occasional minc cues.   Time 4   Period Weeks   Status New   SLP SHORT TERM GOAL #3   Title Pt will follow HEP for dysphagia with occasional min A as tolerated   Time 4   Period Weeks   Status New          SLP Long Term Goals - 07/19/15 1357    SLP LONG TERM GOAL #1   Title Pt will maintain average of 70dB over 12 minute simple conversation with occasional min A   Time 8   Period Weeks   Status New   SLP LONG TERM GOAL #2   Title Pt will be audible over 15 minute conversation in noisy environment/outside of therpay room with occasional min A   Time 8   Period Weeks   Status New   SLP LONG TERM GOAL #3   Title Pt will perfrom HEP for dysphagia with mod I as tolerated   Time 8   Period Weeks   Status New           Plan - 07/19/15 1355    Speech Therapy Frequency 2x / week   Duration --  8 weeks   Treatment/Interventions Aspiration precaution training;Pharyngeal strengthening exercises;SLP instruction and feedback;Compensatory strategies;Internal/external aids;Environmental controls;Patient/family education;Functional tasks   Potential to Achieve Goals Good   Potential Considerations Severity of impairments;Co-morbidities   SLP Home Exercise Plan Loud AH, loud oral reading   Consulted and Agree with Plan of  Care Patient;Family member/caregiver   Family Member Consulted spouse        Problem List Patient Active Problem List   Diagnosis Date Noted  . Preventative health care 04/02/2015  . Constipation 04/02/2015  . 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 Dysphagia, neurologic 07/27/2014  . Paralysis agitans ([place]) 07/27/2014  . Dystonia 07/10/2014  . Low back pain 11/21/2011  . TRANSAMINASES, SERUM, ELEVATED 07/06/2008  . DIARRHEA 05/23/2008  . LYMPH NODE-ENLARGED 07/28/2007  . Non-Hodgkin lymphoma ([place]) 03/23/2007    [name], [name] MS, CCC-SLP 07/19/2015, 3:02 PM  [facility] [address] [place], [place], [address] Phone: [phone]   Fax:  [phone]  Name: JOSSIAH SMOAK MRN: [id number] Date of Birth: 1950/11/23

## 2015-07-25 ENCOUNTER — Ambulatory Visit: Payer: BLUE CROSS/BLUE SHIELD

## 2015-07-25 ENCOUNTER — Ambulatory Visit: Payer: BLUE CROSS/BLUE SHIELD | Attending: Neurology | Admitting: Physical Therapy

## 2015-07-25 DIAGNOSIS — R293 Abnormal posture: Secondary | ICD-10-CM | POA: Diagnosis present

## 2015-07-25 DIAGNOSIS — R1312 Dysphagia, oropharyngeal phase: Secondary | ICD-10-CM | POA: Insufficient documentation

## 2015-07-25 DIAGNOSIS — R490 Dysphonia: Secondary | ICD-10-CM | POA: Insufficient documentation

## 2015-07-25 DIAGNOSIS — R269 Unspecified abnormalities of gait and mobility: Secondary | ICD-10-CM | POA: Insufficient documentation

## 2015-07-25 NOTE — Patient Instructions (Signed)
Provided another copy of forward and backward PWR! Weight shifting at counter with stop in between and progressing to step thru-Pt lost original copy. Provided copy of PWR! Hands as HEP

## 2015-07-25 NOTE — Therapy (Signed)
[facility] [address] [place], [place], [address] Phone: [phone]   Fax:  [phone]  Physical Therapy Treatment  Patient Details  Name: [name] [name] MRN: [id number] Date of Birth: 09-Sep-1951 No Data Recorded  Encounter Date: 07/25/2015      PT End of Session - 07/25/15 1200    Visit Number 7   Number of Visits 9   Date for PT Re-Evaluation 08/27/15   Authorization Type [employer]   PT Start Time 1105   PT Stop Time 1146   PT Time Calculation (min) 41 min   Activity Tolerance Patient tolerated treatment well   Behavior During Therapy [facility] for tasks assessed/performed      Past Medical History  Diagnosis Date  . Dystonia 1994    diagnosed in [place]  . Non Hodgkin's lymphoma 2007    diffuse- 6 cycles of chemo with R CHOP Rituxan - last dose 10/08  . Cervical lymphadenopathy     right - followed my ENT  . Parkinson disease     2015    Past Surgical History  Procedure Laterality Date  . Splenectomy    . Cholecystectomy, laparoscopic    . Port-a-cath removal    . Portacath placement    . Tonsillectomy    . Eye surgery      x2 as child    There were no vitals filed for this visit.  Visit Diagnosis:  Abnormality of gait      Subjective Assessment - 07/25/15 1109    Subjective "cleaned out the garage yesterday" so having L shoulder pain   Patient is accompained by: Family member   Patient Stated Goals Pt's goals for therapy are to stop shuffling and move better.   Currently in Pain? Yes   Pain Score 1    Pain Location Back   Pain Orientation Left   Pain Descriptors / Indicators Aching   Pain Type Acute pain   Pain Onset Yesterday   Aggravating Factors  cleaning garage   Pain Relieving Factors meds        Performed seated and standing PWR! Basic 4 moves x 10 Performed PWR! Forward and backward step weight shifts at counter along with staggered stance weight shifting x 10 Performed  PWR! Hands and provided as HEP        PT Education - 07/25/15 1158    Education provided Yes   Education Details PWR! Hands, PWR! standing, PWR! sitting, Forward and backward step weight shifting at counter with reach, staggered stance weight shift   Person(s) Educated Patient;Spouse   Methods Explanation;Demonstration;Handout;Verbal cues   Comprehension Verbalized understanding;Returned demonstration             PT Long Term Goals - 07/25/15 1203    PT LONG TERM GOAL #1   Title Pt will perform HEP for Parkinson's specific exercises to address posture, balance, gait training for improved functional mobility.  TARGET 07/28/15   Time 4   Period Weeks   Status Achieved   PT LONG TERM GOAL #2   Title Pt will improve 5x sit<>stand to less than or equal to 15 seconds for improved efficiency and safety with transfers.   Time 4   Period Weeks   Status New   PT LONG TERM GOAL #3   Title Pt will improve TUG cognitive score to less than or equal to 15 seconds for improved dual tasking with gait.   Time 4   Period Weeks   Status  New   PT LONG TERM GOAL #4   Title Pt will demonstrate improved balance by taking less than 2 steps in posterior push and release test for improved balance recovery.   Time 4   Period Weeks   Status New   PT LONG TERM GOAL #5   Title Pt will verbalize understanding of local Parkinson's disease resources.   Time 4   Period Weeks   Status New   PT LONG TERM GOAL #6   Title Pt will verbalize plans for continued community fitness upon D/C from PT.   Time 4   Period Weeks   Status New               Plan - 07/25/15 1201    Clinical Impression Statement Pt performed HEP with cues at times for intensity.  Reviewed all HEP and community fitness options and optimal fitness.     Pt will benefit from skilled therapeutic intervention in order to improve on the following deficits Abnormal gait;Decreased balance;Decreased mobility;Difficulty  walking;Postural dysfunction   Rehab Potential Good   PT Frequency 2x / week   PT Duration 4 weeks   PT Treatment/Interventions ADLs/Self Care Home Management;Therapeutic exercise;Therapeutic activities;Functional mobility training;Balance training;Neuromuscular re-education;Gait training;Patient/family education   PT Next Visit Plan Add sit<>stand to HEP, reinforce importance of optimal community fitness, check goals and d/c per Mady Haagensen, PT.   Consulted and Agree with Plan of Care Patient   Family Member Consulted wife        Problem List Patient Active Problem List   Diagnosis Date Noted  . Preventative health care 04/02/2015  . Constipation 04/02/2015  . 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 Dysphagia, neurologic 07/27/2014  . Paralysis agitans ([place]) 07/27/2014  . Dystonia 07/10/2014  . Low back pain 11/21/2011  . TRANSAMINASES, SERUM, ELEVATED 07/06/2008  . DIARRHEA 05/23/2008  . LYMPH NODE-ENLARGED 07/28/2007  . Non-Hodgkin lymphoma ([place]) 03/23/2007    Narda Bonds 07/25/2015, 12:04 PM  [facility] [address] [name], [place], [address] Phone: [phone]   Fax:  [phone]  Name: [name] [name] MRN: [id number] Date of Birth: March 28, 1951    Einar Grad [place] 07/25/2015 12:05 PM Phone: [phone] Fax: [phone]

## 2015-07-25 NOTE — Patient Instructions (Signed)
   Complete 4-5 sentence/two-sentence responses, then have a 2-minute conversation about an opinion/topic - complete that sequence 7-10 times

## 2015-07-25 NOTE — Therapy (Signed)
[facility] [address] [place], [place], [address] Phone: [phone]   Fax:  [phone]  Speech Language Pathology Treatment  Patient Details  Name: [name] [name] MRN: [id number] Date of Birth: Sep 14, 1951 Referring Provider: Dr. Wells Guiles Tat  Encounter Date: 07/25/2015      End of Session - 07/25/15 1257    Visit Number 2   Number of Visits 17   Date for SLP Re-Evaluation 09/13/15   SLP Start Time 1148   SLP Stop Time  1230   SLP Time Calculation (min) 42 min   Activity Tolerance Patient tolerated treatment well      Past Medical History  Diagnosis Date  . Dystonia 1994    diagnosed in [place]  . Non Hodgkin's lymphoma 2007    diffuse- 6 cycles of chemo with R CHOP Rituxan - last dose 10/08  . Cervical lymphadenopathy     right - followed my ENT  . Parkinson disease     2015    Past Surgical History  Procedure Laterality Date  . Splenectomy    . Cholecystectomy, laparoscopic    . Port-a-cath removal    . Portacath placement    . Tonsillectomy    . Eye surgery      x2 as child    There were no vitals filed for this visit.  Visit Diagnosis: Oropharyngeal dysphagia  Hypokinetic Parkinsonian dysphonia      Subjective Assessment - 07/25/15 1152    Subjective "We cleaned out the garage yesterday"   Currently in Pain? Yes   Pain Score 1    Pain Location Shoulder   Pain Orientation Left   Pain Descriptors / Indicators Aching   Pain Type Acute pain   Pain Onset Yesterday               ADULT SLP TREATMENT - 07/25/15 1153    General Information   Behavior/Cognition Alert;Cooperative;Pleasant mood   Treatment Provided   Treatment provided Cognitive-Linquistic   Cognitive-Linquistic Treatment   Treatment focused on Dysarthria   Skilled Treatment Loud /a/ focused on to incr. internal calibration of loudness/effort. Pt produced /a/ at average 94dB.Simple questions answered with  average volume 68dB with initial mod cues consistently, fading to min cues occasionally.  Conversation interspersed between sets of 3-4 structured speech tasks was WN: with occasional min A from SLP.   Assessment / Recommendations / Plan   Plan Continue with current plan of care   Progression Toward Goals   Progression toward goals Progressing toward goals          SLP Education - 07/25/15 1256    Education provided Yes   Education Details reason for loud /a/, need for practicing loud /a with reps/frequency as directed   Person(s) Educated Patient;Spouse   Methods Explanation   Comprehension Verbalized understanding          SLP Short Term Goals - 07/25/15 1153    SLP SHORT TERM GOAL #1   Title Pt will achieve 92dB on loud /a/ over 3 sessions   Time 4   Period Weeks   Status On-going   SLP SHORT TERM GOAL #2   Title Pt will mainitain average of 70dB during structured speech tasks and sentence level utteraces with occasional minc cues.   Time 4   Period Weeks   Status On-going   SLP SHORT TERM GOAL #3   Title Pt will follow HEP for dysphagia with occasional min A as tolerated  Time 4   Period Weeks   Status On-going          SLP Long Term Goals - 07/25/15 1259    SLP LONG TERM GOAL #1   Title Pt will maintain average of 70dB over 12 minute simple conversation with occasional min A   Time 8   Period Weeks   Status On-going   SLP LONG TERM GOAL #2   Title Pt will be audible over 15 minute conversation in noisy environment/outside of therpay room with occasional min A   Time 8   Period Weeks   Status On-going   SLP LONG TERM GOAL #3   Title Pt will perfrom HEP for dysphagia with mod I as tolerated   Time 8   Period Weeks   Status On-going          Plan - 07/25/15 1258    Clinical Impression Statement Pt with good initiation of loudness recalibration tx today, with good success with sentences and with conversation (with SLP cues). cont skilled ST to  maximize intelligibility and swallow function.   Speech Therapy Frequency 2x / week   Duration --  8 weelks   Treatment/Interventions Aspiration precaution training;Pharyngeal strengthening exercises;SLP instruction and feedback;Compensatory strategies;Internal/external aids;Environmental controls;Patient/family education;Functional tasks   Potential to Achieve Goals Good   Potential Considerations Severity of impairments;Co-morbidities        Problem List Patient Active Problem List   Diagnosis Date Noted  . Preventative health care 04/02/2015  . Constipation 04/02/2015  . 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 Dysphagia, neurologic 07/27/2014  . Paralysis agitans ([place]) 07/27/2014  . Dystonia 07/10/2014  . Low back pain 11/21/2011  . TRANSAMINASES, SERUM, ELEVATED 07/06/2008  . DIARRHEA 05/23/2008  . LYMPH NODE-ENLARGED 07/28/2007  . Non-Hodgkin lymphoma ([facility]) 03/23/2007    [name] , [place], CCC-SLP  07/25/2015, 1:00 PM  [facility] [address] [place], [place], [address] Phone: [phone]   Fax:  [phone]   Name: [name] [name] MRN: [id number] Date of Birth: 04-07-1951

## 2015-07-27 ENCOUNTER — Ambulatory Visit: Payer: BLUE CROSS/BLUE SHIELD | Admitting: Physical Therapy

## 2015-07-27 ENCOUNTER — Ambulatory Visit: Payer: BLUE CROSS/BLUE SHIELD

## 2015-07-27 DIAGNOSIS — R269 Unspecified abnormalities of gait and mobility: Secondary | ICD-10-CM | POA: Diagnosis not present

## 2015-07-27 DIAGNOSIS — R490 Dysphonia: Secondary | ICD-10-CM

## 2015-07-27 DIAGNOSIS — G20 Parkinson's disease: Secondary | ICD-10-CM

## 2015-07-27 DIAGNOSIS — R1312 Dysphagia, oropharyngeal phase: Secondary | ICD-10-CM

## 2015-07-27 DIAGNOSIS — R293 Abnormal posture: Secondary | ICD-10-CM

## 2015-07-27 NOTE — Therapy (Signed)
[facility] [address] [place], [place], [address] Phone: [phone]   Fax:  [phone]  Speech Language Pathology Treatment  Patient Details  Name: [name] [name] MRN: [id number] Date of Birth: 21-May-1951 Referring Provider: Dr. Wells Guiles Tat  Encounter Date: 07/27/2015      End of Session - 07/27/15 1144    Visit Number 3   Number of Visits 17   Date for SLP Re-Evaluation 09/13/15   SLP Start Time 1103   SLP Stop Time  1145   SLP Time Calculation (min) 42 min   Activity Tolerance Patient tolerated treatment well      Past Medical History  Diagnosis Date  . Dystonia 1994    diagnosed in [place]  . Non Hodgkin's lymphoma 2007    diffuse- 6 cycles of chemo with R CHOP Rituxan - last dose 10/08  . Cervical lymphadenopathy     right - followed my ENT  . Parkinson disease     2015    Past Surgical History  Procedure Laterality Date  . Splenectomy    . Cholecystectomy, laparoscopic    . Port-a-cath removal    . Portacath placement    . Tonsillectomy    . Eye surgery      x2 as child    There were no vitals filed for this visit.  Visit Diagnosis: Hypokinetic Parkinsonian dysphonia  Oropharyngeal dysphagia      Subjective Assessment - 07/27/15 1107    Subjective Pt has been completing loud /a/ as directed, at home.   Patient is accompained by: Family member  wife               ADULT SLP TREATMENT - 07/27/15 1109    General Information   Behavior/Cognition Alert;Cooperative;Pleasant mood   Treatment Provided   Treatment provided Cognitive-Linquistic   Cognitive-Linquistic Treatment   Treatment focused on Dysarthria   Skilled Treatment Increasing internal objectivity of louder speech, loud /a/ had average 94dB. In structured sentence and multisentence tasks pt req'd rare min A to keep loudness at or above 70dB. SLP incr'd linguistic complexity in conversation and pt req'd occasional  min-mod A for loudness at or above 70dB.   Assessment / Recommendations / Plan   Plan Continue with current plan of care   Progression Toward Goals   Progression toward goals Progressing toward goals            SLP Short Term Goals - 07/27/15 1146    SLP SHORT TERM GOAL #1   Title Pt will achieve 92dB on loud /a/ over 3 sessions   Time 4   Period Weeks   Status On-going   SLP SHORT TERM GOAL #2   Title Pt will mainitain average of 70dB during structured speech tasks and sentence level utteraces with occasional minc cues.   Time 4   Period Weeks   Status On-going   SLP SHORT TERM GOAL #3   Title Pt will follow HEP for dysphagia with occasional min A as tolerated   Time 4   Period Weeks   Status On-going          SLP Long Term Goals - 07/27/15 1147    SLP LONG TERM GOAL #1   Title Pt will maintain average of 70dB over 12 minute simple conversation with occasional min A   Time 8   Period Weeks   Status On-going   SLP LONG TERM GOAL #2   Title Pt will be audible  over 15 minute conversation in noisy environment/outside of therpay room with occasional min A   Time 8   Period Weeks   Status On-going   SLP LONG TERM GOAL #3   Title Pt will perfrom HEP for dysphagia with mod I as tolerated   Time 8   Period Weeks   Status On-going          Plan - 07/27/15 1145    Clinical Impression Statement Pt with good success with sentences and with conversation (with SLP cues). cont skilled ST to maximize intelligibility and swallow function.   Speech Therapy Frequency 2x / week   Duration --  8 weeks or 16 sessinos   Treatment/Interventions Aspiration precaution training;Pharyngeal strengthening exercises;SLP instruction and feedback;Compensatory strategies;Internal/external aids;Environmental controls;Patient/family education;Functional tasks   Potential to Achieve Goals Good   Potential Considerations Severity of impairments;Co-morbidities        Problem List Patient  Active Problem List   Diagnosis Date Noted  . Preventative health care 04/02/2015  . Constipation 04/02/2015  . 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 Dysphagia, neurologic 07/27/2014  . Paralysis agitans ([place]) 07/27/2014  . Dystonia 07/10/2014  . Low back pain 11/21/2011  . TRANSAMINASES, SERUM, ELEVATED 07/06/2008  . DIARRHEA 05/23/2008  . LYMPH NODE-ENLARGED 07/28/2007  . Non-Hodgkin lymphoma ([place]) 03/23/2007    [facility] , [place], CCC-SLP  07/27/2015, 11:47 AM  [facility] [address] [place], [place], [address] Phone: [phone]   Fax:  [phone]   Name: [name] [name] MRN: [id number] Date of Birth: [birth year]

## 2015-07-27 NOTE — Patient Instructions (Signed)
Sit to Stand Transfers:  1. Scoot out to the edge of the chair 2. Place your feet flat on the floor, shoulder width apart.  Make sure your feet are tucked just under your knees. 3. Lean forward (nose over toes) with momentum, and stand up tall with your best posture.  If you need to use your arms, use them as a quick boost up to stand. 4. If you are in a low or soft chair, you can lean back and then forward up to stand, in order to get more momentum. 5. Once you are standing, make sure you are looking ahead and standing tall.  To sit down:  1. Back up until you feel the chair behind your legs. 2. Bend at you hips, reaching  Back for you chair, if needed, then slowly squat to sit down on your chair.  Repeat this exercise 10 repetitions from varied surfaces, 1-2 times per day.

## 2015-07-27 NOTE — Therapy (Signed)
[facility] [address] [place], [place], [address] Phone: [phone]   Fax:  [phone]  Physical Therapy Treatment  Patient Details  Name: [name] [name] MRN: [id number] Date of Birth: [birth year] No Data Recorded  Encounter Date: 07/27/2015      PT End of Session - 07/27/15 1110    Visit Number 8   Number of Visits 9   Date for PT Re-Evaluation 08/27/15   Authorization Type [employer]   PT Start Time 1019   PT Stop Time 1059   PT Time Calculation (min) 40 min   Activity Tolerance Patient tolerated treatment well   Behavior During Therapy [facility] for tasks assessed/performed      Past Medical History  Diagnosis Date  . Dystonia 1994    diagnosed in [place]  . Non Hodgkin's lymphoma 2007    diffuse- 6 cycles of chemo with R CHOP Rituxan - last dose 10/08  . Cervical lymphadenopathy     right - followed my ENT  . Parkinson disease     2015    Past Surgical History  Procedure Laterality Date  . Splenectomy    . Cholecystectomy, laparoscopic    . Port-a-cath removal    . Portacath placement    . Tonsillectomy    . Eye surgery      x2 as child    There were no vitals filed for this visit.  Visit Diagnosis:  Posture abnormality  Abnormality of gait      Subjective Assessment - 07/27/15 1021    Subjective No changes since last visit.   Currently in Pain? No/denies            [facility] PT Assessment - 07/27/15 1053    Observation/Other Assessments   Focus on Therapeutic Outcomes (FOTO)  Functional Status measure 93; Neuro QOL 62.3%                     [facility] Adult PT Treatment/Exercise - 07/27/15 1053    Transfers   Transfers Sit to Stand;Stand to Sit   Sit to Stand 7: Independent;From chair/3-in-1;Without upper extremity assist   Five time sit to stand comments  15.68  1st trial, 9.63 sec second trial   Stand to Sit 7: Independent;To chair/3-in-1;Without upper extremity assist   Comments Added sit<>stand to HEP   Ambulation/Gait   Ambulation/Gait Yes   Ambulation/Gait Assistance 7: Independent   Ambulation Distance (Feet) 860 Feet  in 3 minute walk   Assistive device None   Gait Pattern Step-through pattern  improved arm swing and foot clearance   Ambulation Surface Level;Indoor   Gait velocity 7.10 sec = 4.62 ft/sec    Gait Comments No forward flexed posture or episodes of decr foot clearance with gait in 3 minute walk test today.   Standardized Balance Assessment   Standardized Balance Assessment Timed Up and Go Test   Timed Up and Go Test   TUG Normal TUG;Cognitive TUG   Normal TUG (seconds) 10.03   Cognitive TUG (seconds) 9.77   High Level Balance   High Level Balance Comments Push and release test forward and backward directions:  pt takes one step and independently recovers balance         Self Care:  Pt completed FOTO measure-see scores above.  Discussed optimal fitness upon d/c from PT, including addition of sit<>stand for HEP.  Discussed/provided information to pt/wife on [employer] weekly exercise class.  Pt reports he won't do the upcoming  session due to still being in speech therapy, but he may join the class in January.  Discussed purpose of and plan for return screens for physical therapy and speech therapy.  Pt not interested in OT screen.  Pt in agreement with progress towards goals and plan for discharge today.       PT Education - 07/27/15 1109    Education provided Yes   Education Details HEP for sit<>stand, progress towards goals, plan for D/C today.  Purpose of/plans for return screen in 6 months.   Person(s) Educated Patient;Spouse   Methods Explanation;Demonstration;Handout   Comprehension Verbalized understanding;Returned demonstration             PT Long Term Goals - 07/27/15 1022    PT LONG TERM GOAL #1   Title Pt will perform HEP for Parkinson's specific exercises to address posture, balance, gait training for  improved functional mobility.  TARGET 07/28/15   Time 4   Period Weeks   Status Achieved   PT LONG TERM GOAL #2   Title Pt will improve 5x sit<>stand to less than or equal to 15 seconds for improved efficiency and safety with transfers.   Baseline 15.68 seconds, 9.63 seconds   Time 4   Period Weeks   Status Achieved   PT LONG TERM GOAL #3   Title Pt will improve TUG cognitive score to less than or equal to 15 seconds for improved dual tasking with gait.   Baseline TUG cognitive 9.77 sec   Time 4   Period Weeks   Status Achieved   PT LONG TERM GOAL #4   Title Pt will demonstrate improved balance by taking less than 2 steps in posterior push and release test for improved balance recovery.   Time 4   Period Weeks   Status Achieved   PT LONG TERM GOAL #5   Title Pt will verbalize understanding of local Parkinson's disease resources.   Time 4   Period Weeks   Status Achieved   PT LONG TERM GOAL #6   Title Pt will verbalize plans for continued community fitness upon D/C from PT.   Time 4   Period Weeks   Status Achieved               Plan - 07/27/15 1111    Clinical Impression Statement Pt has met all LTGs and appears to be ready for discharge this visit.  He has improved functional mobility measures and demonstrates improved intensity and amplitude of movements.     Pt will benefit from skilled therapeutic intervention in order to improve on the following deficits Abnormal gait;Decreased balance;Decreased mobility;Difficulty walking;Postural dysfunction   Rehab Potential Good   PT Frequency 2x / week   PT Duration 4 weeks   PT Treatment/Interventions ADLs/Self Care Home Management;Therapeutic exercise;Therapeutic activities;Functional mobility training;Balance training;Neuromuscular re-education;Gait training;Patient/family education   PT Next Visit Plan Discharge this visit   Consulted and Agree with Plan of Care Patient;Family member/caregiver   Family Member Consulted  wife        Problem List Patient Active Problem List   Diagnosis Date Noted  . Preventative health care 04/02/2015  . Constipation 04/02/2015  . 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 Dysphagia, neurologic 07/27/2014  . Paralysis agitans ([place]) 07/27/2014  . Dystonia 07/10/2014  . Low back pain 11/21/2011  . TRANSAMINASES, SERUM, ELEVATED 07/06/2008  . DIARRHEA 05/23/2008  . LYMPH NODE-ENLARGED 07/28/2007  . Non-Hodgkin lymphoma ([place]) 03/23/2007  Farryn Linares W. 07/27/2015, 11:17 AM  Frazier Butt., PT  [facility] [address] [place], [place], [address] Phone: [phone]   Fax:  [phone]  Name: [name] [name] MRN: [id number] Date of Birth: June 07, 1951  PHYSICAL THERAPY DISCHARGE SUMMARY  Visits from Start of Care: 7  Current functional level related to goals / functional outcomes: See long term goals.  Pt has met all goals and appears to have improved awareness of movement and improved efficiency of movement patterns.   Remaining deficits: Posture, balance   Education / Equipment: Pt/wife educated in [employer], community fitness/Parkinson's resources.    Plan: Patient agrees to discharge.  Patient goals were met. Patient is being discharged due to meeting the stated rehab goals.  ?????Pt plans to return to PT screen in 6 months due to progressive nature of disease.        Mady Haagensen, PT 07/27/2015 11:22 AM Phone: [phone] Fax: [phone]

## 2015-07-27 NOTE — Patient Instructions (Signed)
  Please complete the assigned speech therapy homework and return it to your next session.  

## 2015-07-31 ENCOUNTER — Ambulatory Visit: Payer: BLUE CROSS/BLUE SHIELD

## 2015-07-31 DIAGNOSIS — R269 Unspecified abnormalities of gait and mobility: Secondary | ICD-10-CM | POA: Diagnosis not present

## 2015-07-31 DIAGNOSIS — R490 Dysphonia: Secondary | ICD-10-CM

## 2015-07-31 DIAGNOSIS — R1312 Dysphagia, oropharyngeal phase: Secondary | ICD-10-CM

## 2015-07-31 NOTE — Therapy (Signed)
[facility] [address] [place], [place], [address] Phone: [phone]   Fax:  [phone]  Speech Language Pathology Treatment  Patient Details  Name: [name] [name] MRN: [id number] Date of Birth: 01/09/1951 Referring Provider: Dr. Wells Guiles Tat  Encounter Date: 07/31/2015      End of Session - 07/31/15 1319    Visit Number 4   Number of Visits 76   SLP Start Time 3846   SLP Stop Time  6599   SLP Time Calculation (min) 42 min   Activity Tolerance Patient tolerated treatment well      Past Medical History  Diagnosis Date  . Dystonia 1994    diagnosed in [place]  . Non Hodgkin's lymphoma 2007    diffuse- 6 cycles of chemo with R CHOP Rituxan - last dose 10/08  . Cervical lymphadenopathy     right - followed my ENT  . Parkinson disease     2015    Past Surgical History  Procedure Laterality Date  . Splenectomy    . Cholecystectomy, laparoscopic    . Port-a-cath removal    . Portacath placement    . Tonsillectomy    . Eye surgery      x2 as child    There were no vitals filed for this visit.  Visit Diagnosis: Hypokinetic Parkinsonian dysphonia  Oropharyngeal dysphagia      Subjective Assessment - 07/31/15 1153    Subjective "The "ahs" are easy."   Currently in Pain? No/denies               ADULT SLP TREATMENT - 07/31/15 1154    General Information   Behavior/Cognition Alert;Cooperative;Pleasant mood   Treatment Provided   Treatment provided Cognitive-Linquistic   Cognitive-Linquistic Treatment   Treatment focused on Dysarthria   Skilled Treatment Pt prior to loud /a/ spoke with average 67dB. Loud /a/ used to aid in recalibration of loudness objectivitiy in conversation with average 90dB. In paragraph reading pt averaged 70 dB. In multisentence tasks, SLP incr'd compelxity of task and pt averaged 68dB.    Assessment / Recommendations / Plan   Plan Continue with current plan of care    Progression Toward Goals   Progression toward goals Progressing toward goals            SLP Short Term Goals - 07/31/15 1320    SLP SHORT TERM GOAL #1   Title Pt will achieve 92dB on loud /a/ over 3 sessions   Time 3   Period Weeks   Status On-going   SLP SHORT TERM GOAL #2   Title Pt will mainitain average of 70dB during structured speech tasks and sentence level utteraces with occasional minc cues.   Time 3   Period Weeks   Status On-going   SLP SHORT TERM GOAL #3   Title Pt will follow HEP for dysphagia with occasional min A as tolerated   Time 3   Period Weeks   Status On-going          SLP Long Term Goals - 07/31/15 1320    SLP LONG TERM GOAL #1   Title Pt will maintain average of 70dB over 12 minute simple conversation with occasional min A   Time 7   Period Weeks   Status On-going   SLP LONG TERM GOAL #2   Title Pt will be audible over 15 minute conversation in noisy environment/outside of therpay room with occasional min A   Time 7  Period Weeks   Status On-going   SLP LONG TERM GOAL #3   Title Pt will perfrom HEP for dysphagia with mod I as tolerated   Time 7   Period Weeks   Status On-going          Plan - 07/31/15 1319    Clinical Impression Statement Pt with good success with sentences and with conversation (with SLP cues). cont skilled ST to maximize intelligibility and swallow function.   Speech Therapy Frequency 2x / week   Duration --  7 weeks or 13 visits   Treatment/Interventions Aspiration precaution training;Pharyngeal strengthening exercises;SLP instruction and feedback;Compensatory strategies;Internal/external aids;Environmental controls;Patient/family education;Functional tasks   Potential to Achieve Goals Good   Potential Considerations Severity of impairments;Co-morbidities        Problem List Patient Active Problem List   Diagnosis Date Noted  . Preventative health care 04/02/2015  . Constipation 04/02/2015  .  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 Dysphagia, neurologic 07/27/2014  . Paralysis agitans ([place]) 07/27/2014  . Dystonia 07/10/2014  . Low back pain 11/21/2011  . TRANSAMINASES, SERUM, ELEVATED 07/06/2008  . DIARRHEA 05/23/2008  . LYMPH NODE-ENLARGED 07/28/2007  . Non-Hodgkin lymphoma ([place]) 03/23/2007    [facility] , [place], CCC-SLP  07/31/2015, 1:21 PM  [facility] [address] [place], [place], [address] Phone: [phone]   Fax:  [phone]   Name: [name] [name] MRN: [id number] Date of Birth: 1950/10/21

## 2015-08-01 ENCOUNTER — Ambulatory Visit: Payer: BLUE CROSS/BLUE SHIELD

## 2015-08-01 DIAGNOSIS — R1312 Dysphagia, oropharyngeal phase: Secondary | ICD-10-CM

## 2015-08-01 DIAGNOSIS — R269 Unspecified abnormalities of gait and mobility: Secondary | ICD-10-CM | POA: Diagnosis not present

## 2015-08-01 DIAGNOSIS — R490 Dysphonia: Secondary | ICD-10-CM

## 2015-08-01 NOTE — Therapy (Signed)
[facility] [address] [place], [place], [address] Phone: [phone]   Fax:  [phone]  Speech Language Pathology Treatment  Patient Details  Name: BOBBIE VALLETTA MRN: [id number] Date of Birth: May 06, 1951 Referring Provider: Dr. Wells Guiles Tat  Encounter Date: 08/01/2015      End of Session - 08/01/15 0933    Visit Number 5   Number of Visits 17   Date for SLP Re-Evaluation 09/13/15   SLP Start Time 0850   SLP Stop Time  0931   SLP Time Calculation (min) 41 min   Activity Tolerance Patient tolerated treatment well      Past Medical History  Diagnosis Date  . Dystonia 1994    diagnosed in [place]  . Non Hodgkin's lymphoma 2007    diffuse- 6 cycles of chemo with R CHOP Rituxan - last dose 10/08  . Cervical lymphadenopathy     right - followed my ENT  . Parkinson disease     2015    Past Surgical History  Procedure Laterality Date  . Splenectomy    . Cholecystectomy, laparoscopic    . Port-a-cath removal    . Portacath placement    . Tonsillectomy    . Eye surgery      x2 as child    There were no vitals filed for this visit.  Visit Diagnosis: Hypokinetic Parkinsonian dysphonia  Oropharyngeal dysphagia      Subjective Assessment - 08/01/15 0906    Subjective "It feels like I was just here."               ADULT SLP TREATMENT - 08/01/15 0906    General Information   Behavior/Cognition Alert;Cooperative;Pleasant mood   Treatment Provided   Treatment provided Cognitive-Linquistic   Cognitive-Linquistic Treatment   Treatment focused on Dysarthria   Skilled Treatment Loud /a/ with average 88dB, in order to objectify louder speech. In multisentence tasks, pt maintained speech at or above 70dB 60% of the time. In simple conversation, pt maintained 70dB 75% of the time with rare min A.    Assessment / Recommendations / Plan   Plan Continue with current plan of care   Progression Toward  Goals   Progression toward goals Progressing toward goals            SLP Short Term Goals - 08/01/15 0934    SLP SHORT TERM GOAL #1   Title Pt will achieve 92dB on loud /a/ over 3 sessions   Time 3   Period Weeks   Status On-going   SLP SHORT TERM GOAL #2   Title Pt will mainitain average of 70dB during structured speech tasks and sentence level utteraces with occasional minc cues.   Time 3   Period Weeks   Status On-going   SLP SHORT TERM GOAL #3   Title Pt will follow HEP for dysphagia with occasional min A as tolerated   Time 3   Period Weeks   Status On-going          SLP Long Term Goals - 08/01/15 0934    SLP LONG TERM GOAL #1   Title Pt will maintain average of 70dB over 12 minute simple conversation with occasional min A   Time 7   Period Weeks   Status On-going   SLP LONG TERM GOAL #2   Title Pt will be audible over 15 minute conversation in noisy environment/outside of therpay room with occasional min A   Time 7  Period Weeks   Status On-going   SLP LONG TERM GOAL #3   Title Pt will perfrom HEP for dysphagia with mod I as tolerated   Time 7   Period Weeks   Status On-going          Plan - 08/01/15 6754    Clinical Impression Statement Pt with good success with sentences (with SLP cues) and with conversation. cont skilled ST to maximize intelligibility and swallow function.   Speech Therapy Frequency 2x / week   Duration --  7 weeks or 12 visits   Treatment/Interventions Aspiration precaution training;Pharyngeal strengthening exercises;SLP instruction and feedback;Compensatory strategies;Internal/external aids;Environmental controls;Patient/family education;Functional tasks   Potential to Achieve Goals Good   Potential Considerations Severity of impairments;Co-morbidities        Problem List Patient Active Problem List   Diagnosis Date Noted  . Preventative health care 04/02/2015  . Constipation 04/02/2015  . 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 Dysphagia, neurologic 07/27/2014  . Paralysis agitans ([place]) 07/27/2014  . Dystonia 07/10/2014  . Low back pain 11/21/2011  . TRANSAMINASES, SERUM, ELEVATED 07/06/2008  . DIARRHEA 05/23/2008  . LYMPH NODE-ENLARGED 07/28/2007  . Non-Hodgkin lymphoma ([place]) 03/23/2007    [facility] , [place], CCC-SLP   08/01/2015, 9:35 AM  [facility] [address] [place], [place], [address] Phone: [phone]   Fax:  [phone]   Name: BRISTOL SOY MRN: [id number] Date of Birth: [birth year]

## 2015-08-07 ENCOUNTER — Ambulatory Visit: Payer: BLUE CROSS/BLUE SHIELD

## 2015-08-07 DIAGNOSIS — G20 Parkinson's disease: Secondary | ICD-10-CM

## 2015-08-07 DIAGNOSIS — R269 Unspecified abnormalities of gait and mobility: Secondary | ICD-10-CM | POA: Diagnosis not present

## 2015-08-07 DIAGNOSIS — R1312 Dysphagia, oropharyngeal phase: Secondary | ICD-10-CM

## 2015-08-07 DIAGNOSIS — R490 Dysphonia: Secondary | ICD-10-CM

## 2015-08-07 NOTE — Therapy (Signed)
[facility] [address] [place], [place], [address] Phone: [phone]   Fax:  [phone]  Speech Language Pathology Treatment  Patient Details  Name: [name] [name] MRN: [id number] Date of Birth: 04/29/1951 Referring Provider: Dr. Wells Guiles Tat  Encounter Date: 08/07/2015      End of Session - 08/07/15 1537    Visit Number 6   Number of Visits 17   Date for SLP Re-Evaluation 09/13/15   SLP Start Time 1446   SLP Stop Time  [id number]   SLP Time Calculation (min) 44 min      Past Medical History  Diagnosis Date  . Dystonia 1994    diagnosed in [place]  . Non Hodgkin's lymphoma 2007    diffuse- 6 cycles of chemo with R CHOP Rituxan - last dose 10/08  . Cervical lymphadenopathy     right - followed my ENT  . Parkinson disease     2015    Past Surgical History  Procedure Laterality Date  . Splenectomy    . Cholecystectomy, laparoscopic    . Port-a-cath removal    . Portacath placement    . Tonsillectomy    . Eye surgery      x2 as child    There were no vitals filed for this visit.  Visit Diagnosis: Hypokinetic Parkinsonian dysphonia  Oropharyngeal dysphagia      Subjective Assessment - 08/07/15 1448    Subjective "I'm getting a lot of favorable comments (re: my speech)."               ADULT SLP TREATMENT - 08/07/15 1448    General Information   Behavior/Cognition Alert;Cooperative;Pleasant mood   Treatment Provided   Treatment provided Cognitive-Linquistic;Dysphagia   Dysphagia Treatment   Other treatment/comments Introduced pt to HEP for swallowing. SLP developed program for him based upon modified barium swallow (MBSS) from November 2015. SLP wrote Dr. Roberts Gaudy suggesting another follow up MBSS in approx 8 weeks.Pt req'd consistent mod A today.   Pain Assessment   Pain Assessment No/denies pain   Cognitive-Linquistic Treatment   Treatment focused on Dysarthria   Skilled  Treatment Loud /a/ average 88dB in order for pt to objectify louder, WNL speech. In multisentence tasks the patient was independent ("2-3 sentences). In 15 minutes simple to mod complex conversation  pt maintained volume at or above 70dB 80% of the time.   Assessment / Recommendations / Plan   Plan Continue with current plan of care   Progression Toward Goals   Progression toward goals Progressing toward goals          SLP Education - 08/07/15 1532    Education provided Yes   Education Details HEP for swallowing   Person(s) Educated Patient   Methods Explanation;Demonstration;Handout   Comprehension Verbalized understanding;Returned demonstration;Verbal cues required          SLP Short Term Goals - 08/07/15 1540    SLP SHORT TERM GOAL #1   Title Pt will achieve 90dB average on loud /a/ over 3 sessions   Time 2   Period Weeks   Status Revised   SLP SHORT TERM GOAL #2   Title Pt will mainitain average of 70dB during structured speech tasks and sentence level utteraces with occasional minc cues.   Time 2   Period Weeks   Status Achieved   SLP SHORT TERM GOAL #3   Title Pt will follow HEP for dysphagia with occasional min A as tolerated   Time  2   Period Weeks   Status On-going          SLP Long Term Goals - 08/07/15 1521    SLP LONG TERM GOAL #1   Title Pt will maintain average of 70dB over 12 minute simple conversation with occasional min A   Time 6   Period Weeks   Status On-going   SLP LONG TERM GOAL #2   Title Pt will be audible over 15 minute conversation in noisy environment/outside of therpay room with occasional min A   Time 6   Period Weeks   Status On-going   SLP LONG TERM GOAL #3   Title Pt will perfrom HEP for dysphagia with mod I as tolerated   Time 6   Period Weeks   Status On-going          Plan - 08/07/15 1539    Clinical Impression Statement Pt with good success with simple to mod complex conversation. Pt now has swallowing HEP. Cont  skilled ST to maximize intelligibility in conversation, and swallow function.   Speech Therapy Frequency 2x / week   Duration --  6 weeks or 11 visits   Treatment/Interventions Aspiration precaution training;Pharyngeal strengthening exercises;SLP instruction and feedback;Compensatory strategies;Internal/external aids;Environmental controls;Patient/family education;Functional tasks   Potential to Achieve Goals Good   Potential Considerations Severity of impairments;Co-morbidities        Problem List Patient Active Problem List   Diagnosis Date Noted  . Preventative health care 04/02/2015  . Constipation 04/02/2015  . 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 Dysphagia, neurologic 07/27/2014  . Paralysis agitans ([place]) 07/27/2014  . Dystonia 07/10/2014  . Low back pain 11/21/2011  . TRANSAMINASES, SERUM, ELEVATED 07/06/2008  . DIARRHEA 05/23/2008  . LYMPH NODE-ENLARGED 07/28/2007  . Non-Hodgkin lymphoma ([place]) 03/23/2007    [facility] , [place], [place]   08/07/2015, 3:41 PM  [facility] [address] [place], [place], [address] Phone: [phone]   Fax:  [phone]   Name: [name] [name] MRN: [id number] Date of Birth: 11/13/1950

## 2015-08-07 NOTE — Patient Instructions (Signed)
SWALLOWING EXERCISES Complete 6 of 7 days/week for 8 weeks,  then 2-3 times per week afterwards  1. Effortful Swallows - Squeeze hard with the muscles in your neck while you swallow your  saliva or a sip of water - Repeat 20 times, 2 times a day, and use whenever you eat or drink  2. [place]ako Swallow - swallow with your tongue sticking out - Stick tongue out past your teeth and gently bite tongue with your teeth - Swallow, while holding your tongue with your teeth - Repeat 20 times, 2 times a day *use a wet spoon if your mouth gets dry*  3. Mendelsohn Maneuver - "half swallow" exercise - Start to swallow, and keep your Adam's apple up by squeezing hard with the muscles of the throat - Hold the squeeze for 5-7 seconds and then relax - Repeat 15 times, 2 times a day *use a wet spoon if your mouth gets dry* - Say "HUH!" loudly, then hold your breath for 3 seconds at your voice box - Repeat 20 times, 2-3 times a day  4. Chin pushback - Open your mouth  - Place your fist UNDER your chin near your neck, and push back with your fist for 5 seconds - Repeat 10 times, 2-3 times a day

## 2015-08-10 ENCOUNTER — Ambulatory Visit: Payer: BLUE CROSS/BLUE SHIELD

## 2015-08-13 ENCOUNTER — Ambulatory Visit: Payer: BLUE CROSS/BLUE SHIELD | Admitting: Speech Pathology

## 2015-08-13 DIAGNOSIS — G20 Parkinson's disease: Secondary | ICD-10-CM

## 2015-08-13 DIAGNOSIS — R490 Dysphonia: Secondary | ICD-10-CM

## 2015-08-13 DIAGNOSIS — R269 Unspecified abnormalities of gait and mobility: Secondary | ICD-10-CM | POA: Diagnosis not present

## 2015-08-13 NOTE — Patient Instructions (Signed)
Continue focused practice of loud conversation at home.

## 2015-08-13 NOTE — Therapy (Signed)
[facility] [address] [place], [place], [address] Phone: [phone]   Fax:  [phone]  Speech Language Pathology Treatment  Patient Details  Name: [name] [name] MRN: [id number] Date of Birth: October 21, 1950 Referring Provider: Dr. Wells Guiles Tat  Encounter Date: 08/13/2015      End of Session - 08/13/15 1401    Visit Number 7   Number of Visits 17   Date for SLP Re-Evaluation 09/13/15   SLP Start Time [id number]   SLP Stop Time  1402   SLP Time Calculation (min) 45 min      Past Medical History  Diagnosis Date  . Dystonia 1994    diagnosed in [place]  . Non Hodgkin's lymphoma 2007    diffuse- 6 cycles of chemo with R CHOP Rituxan - last dose 10/08  . Cervical lymphadenopathy     right - followed my ENT  . Parkinson disease     2015    Past Surgical History  Procedure Laterality Date  . Splenectomy    . Cholecystectomy, laparoscopic    . Port-a-cath removal    . Portacath placement    . Tonsillectomy    . Eye surgery      x2 as child    There were no vitals filed for this visit.  Visit Diagnosis: Hypokinetic Parkinsonian dysphonia      Subjective Assessment - 08/13/15 1322    Subjective "people are very happy with my speech - it has been very gratifying"               ADULT SLP TREATMENT - 08/13/15 1322    General Information   Behavior/Cognition Alert;Cooperative;Pleasant mood   Treatment Provided   Treatment provided Cognitive-Linquistic   Dysphagia Treatment   Other treatment/comments Pt perfromed HEP (modified due to pt's head cold) with occasional min verbal instruction and modeling.    Pain Assessment   Pain Assessment No/denies pain   Cognitive-Linquistic Treatment   Treatment focused on Dysarthria   Skilled Treatment Loud /a/ average 86db, initially. After instruction to get louder, pt averaged 90dB over 3 trials of /a/. Oral reading of paragraphs with visual cues for breath  support  average  70 dB.  Structured speech tasks with min cues for breath support with average of 72dB. Simple conversation average  70dB with rare min A.    Assessment / Recommendations / Plan   Plan Continue with current plan of care            SLP Short Term Goals - 08/13/15 1401    SLP SHORT TERM GOAL #1   Title Pt will achieve 90dB average on loud /a/ over 3 sessions   Time 2   Period Weeks   Status Revised   SLP SHORT TERM GOAL #2   Title Pt will mainitain average of 70dB during structured speech tasks and sentence level utteraces with occasional minc cues.   Time 2   Period Weeks   Status Achieved   SLP SHORT TERM GOAL #3   Title Pt will follow HEP for dysphagia with occasional min A as tolerated   Time 2   Period Weeks   Status Achieved          SLP Long Term Goals - 08/13/15 1401    SLP LONG TERM GOAL #1   Title Pt will maintain average of 70dB over 12 minute simple conversation with occasional min A   Time 5   Period Weeks   Status  On-going   SLP LONG TERM GOAL #2   Title Pt will be audible over 15 minute conversation in noisy environment/outside of therpay room with occasional min A   Time 5   Period Weeks   Status On-going   SLP LONG TERM GOAL #3   Title Pt will perfrom HEP for dysphagia with mod I as tolerated   Time 5   Period Weeks   Status On-going          Plan - 08/13/15 1400    Clinical Impression Statement Pt with good success with simple to mod complex conversation. Pt now has swallowing HEP. Cont skilled ST to maximize intelligibility in conversation, and swallow function.   Speech Therapy Frequency 2x / week   Treatment/Interventions Aspiration precaution training;Pharyngeal strengthening exercises;SLP instruction and feedback;Compensatory strategies;Internal/external aids;Environmental controls;Patient/family education;Functional tasks   Potential to Achieve Goals Good   Potential Considerations Severity of impairments;Co-morbidities    SLP Home Exercise Plan Loud AH, loud oral reading   Consulted and Agree with Plan of Care Patient        Problem List Patient Active Problem List   Diagnosis Date Noted  . Preventative health care 04/02/2015  . Constipation 04/02/2015  . 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 Dysphagia, neurologic 07/27/2014  . Paralysis agitans ([place]) 07/27/2014  . Dystonia 07/10/2014  . Low back pain 11/21/2011  . TRANSAMINASES, SERUM, ELEVATED 07/06/2008  . DIARRHEA 05/23/2008  . LYMPH NODE-ENLARGED 07/28/2007  . Non-Hodgkin lymphoma ([place]) 03/23/2007    Oreatha Fabry, [name] MS, CCC-SLP 08/13/2015, 2:02 PM  [facility] [address] [place], [place], [address] Phone: [phone]   Fax:  [phone]   Name: [name] [name] MRN: [id number] Date of Birth: June 22, 1951

## 2015-08-21 ENCOUNTER — Ambulatory Visit: Payer: BLUE CROSS/BLUE SHIELD

## 2015-08-21 DIAGNOSIS — R269 Unspecified abnormalities of gait and mobility: Secondary | ICD-10-CM | POA: Diagnosis not present

## 2015-08-21 DIAGNOSIS — R490 Dysphonia: Secondary | ICD-10-CM

## 2015-08-21 DIAGNOSIS — R1312 Dysphagia, oropharyngeal phase: Secondary | ICD-10-CM

## 2015-08-21 NOTE — Therapy (Signed)
[name] [name] [address] [place], [place], [address] Phone: [phone]   Fax:  [phone]  Speech Language Pathology Treatment  Patient Details  Name: [name] [name] MRN: [id number] Date of Birth: 14-Apr-1951 Referring Provider: Dr. Wells Guiles Tat  Encounter Date: 08/21/2015      End of Session - 08/21/15 1101    Visit Number 8   Number of Visits 17   Date for SLP Re-Evaluation 09/13/15   SLP Start Time 37   SLP Stop Time  1100   SLP Time Calculation (min) 41 min   Activity Tolerance Patient tolerated treatment well      Past Medical History  Diagnosis Date  . Dystonia 1994    diagnosed in [place]  . Non Hodgkin's lymphoma 2007    diffuse- 6 cycles of chemo with R CHOP Rituxan - last dose 10/08  . Cervical lymphadenopathy     right - followed my ENT  . Parkinson disease     2015    Past Surgical History  Procedure Laterality Date  . Splenectomy    . Cholecystectomy, laparoscopic    . Port-a-cath removal    . Portacath placement    . Tonsillectomy    . Eye surgery      x2 as child    There were no vitals filed for this visit.  Visit Diagnosis: Hypokinetic Parkinsonian dysphonia  Oropharyngeal dysphagia      Subjective Assessment - 08/21/15 1030    Subjective "People are still commenting on my speech - unsolicited."               ADULT SLP TREATMENT - 08/21/15 1034    General Information   Behavior/Cognition Alert;Cooperative;Pleasant mood   Treatment Provided   Treatment provided Cognitive-Linquistic   Pain Assessment   Pain Assessment 0-10   Pain Score 1    Pain Location throat   Pain Descriptors / Indicators Sore   Pain Intervention(s) Premedicated before session;Monitored during session   Cognitive-Linquistic Treatment   Treatment focused on Dysarthria   Skilled Treatment Loud /a/ targeted today to recalibrate pt's speech loudness in convresation - average 90dB. In  structured tasks pt maintained volume 70dB 100% of the time. In simple to mod complex conversation pt maintained 70dB inside the speech room, and when outside in mod complex conversation, pt's speech was intelligible 100%.   Assessment / Recommendations / Plan   Plan Continue with current plan of care   Progression Toward Goals   Progression toward goals Progressing toward goals            SLP Short Term Goals - 08/21/15 1102    SLP SHORT TERM GOAL #1   Title Pt will achieve 90dB average on loud /a/ over 3 sessions   Status Achieved   SLP SHORT TERM GOAL #2   Title Pt will mainitain average of 70dB during structured speech tasks and sentence level utteraces with occasional minc cues.   Time 2   Period Weeks   Status Achieved   SLP SHORT TERM GOAL #3   Title Pt will follow HEP for dysphagia with occasional min A as tolerated   Time 2   Period Weeks   Status Achieved          SLP Long Term Goals - 08/21/15 1102    SLP LONG TERM GOAL #1   Title Pt will maintain average of 70dB over 12 minute simple conversation with occasional min A   Time 5  Period Weeks   Status Achieved   SLP LONG TERM GOAL #2   Title Pt will be audible over 15 minute conversation in noisy environment/outside of therpay room with occasional min A   Time 5   Period Weeks   Status On-going   SLP LONG TERM GOAL #3   Title Pt will perfrom HEP for dysphagia with mod I as tolerated   Time 5   Period Weeks   Status On-going          Plan - 08/21/15 1101    Clinical Impression Statement Pt with good success with simple to mod complex conversation both in and outside [place] room. Cont skilled ST to maximize intelligibility in conversation, and swallow function. Review swallow HEP next session.   Speech Therapy Frequency 2x / week   Duration --  4 weeks or 9 visits   Treatment/Interventions Aspiration precaution training;Pharyngeal strengthening exercises;SLP instruction and feedback;Compensatory  strategies;Internal/external aids;Environmental controls;Patient/family education;Functional tasks   Potential to Achieve Goals Good   Potential Considerations Severity of impairments;Co-morbidities        Problem List Patient Active Problem List   Diagnosis Date Noted  . Preventative health care 04/02/2015  . Constipation 04/02/2015  . 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 Dysphagia, neurologic 07/27/2014  . Paralysis agitans ([place]) 07/27/2014  . Dystonia 07/10/2014  . Low back pain 11/21/2011  . TRANSAMINASES, SERUM, ELEVATED 07/06/2008  . DIARRHEA 05/23/2008  . LYMPH NODE-ENLARGED 07/28/2007  . Non-Hodgkin lymphoma ([place]) 03/23/2007    [facility] , [place], CCC-SLP  08/21/2015, 11:03 AM  [facility] [address] [place], [place], [address] Phone: [phone]   Fax:  [phone]   Name: [name] [name] MRN: [id number] Date of Birth: April 21, 1951

## 2015-08-23 ENCOUNTER — Ambulatory Visit: Payer: BLUE CROSS/BLUE SHIELD | Attending: Neurology

## 2015-08-23 DIAGNOSIS — G20 Parkinson's disease: Secondary | ICD-10-CM

## 2015-08-23 DIAGNOSIS — R490 Dysphonia: Secondary | ICD-10-CM | POA: Diagnosis present

## 2015-08-23 DIAGNOSIS — R1312 Dysphagia, oropharyngeal phase: Secondary | ICD-10-CM | POA: Insufficient documentation

## 2015-08-23 NOTE — Therapy (Signed)
[facility] [address] [place], [place], [address] Phone: [phone]   Fax:  [phone]  Speech Language Pathology Treatment  Patient Details  Name: [name] [name] MRN: [id number] Date of Birth: August 17, 1951 Referring Provider: Dr. Wells Guiles Tat  Encounter Date: 08/23/2015      End of Session - 08/23/15 1349    Visit Number 9   Number of Visits 17   Date for SLP Re-Evaluation 09/13/15   SLP Start Time 1018   SLP Stop Time  1100   SLP Time Calculation (min) 42 min   Activity Tolerance Patient tolerated treatment well      Past Medical History  Diagnosis Date  . Dystonia 1994    diagnosed in [place]  . Non Hodgkin's lymphoma 2007    diffuse- 6 cycles of chemo with R CHOP Rituxan - last dose 10/08  . Cervical lymphadenopathy     right - followed my ENT  . Parkinson disease     2015    Past Surgical History  Procedure Laterality Date  . Splenectomy    . Cholecystectomy, laparoscopic    . Port-a-cath removal    . Portacath placement    . Tonsillectomy    . Eye surgery      x2 as child    There were no vitals filed for this visit.  Visit Diagnosis: Oropharyngeal dysphagia  Hypokinetic Parkinsonian dysphonia      Subjective Assessment - 08/23/15 1025    Subjective Pt's cold has not fostered practice for incr number of exercises on HEP.               ADULT SLP TREATMENT - 08/23/15 1027    General Information   Behavior/Cognition Alert;Cooperative;Pleasant mood   Treatment Provided   Treatment provided Dysphagia   Dysphagia Treatment   Other treatment/comments Pt with good to excellent procedure with HEP, independently completed.    Pain Assessment   Pain Assessment No/denies pain   Cognitive-Linquistic Treatment   Treatment focused on Dysarthria   Skilled Treatment Loud /a/ measred today to recalibrate pt's spontaneous speech with average 91dB. In conversation outside of the therapy  room pt maintained intelligible and louder speech for 14 minutes.    Assessment / Recommendations / Plan   Plan --  reduce to x1/week due to progress   Dysphagia Recommendations   Diet recommendations Regular;Thin liquid   Postural Changes and/or Swallow Maneuvers --  as directed on modified barium swallow   Progression Toward Goals   Progression toward goals Progressing toward goals            SLP Short Term Goals - 08/23/15 1351    SLP SHORT TERM GOAL #1   Title Pt will achieve 90dB average on loud /a/ over 3 sessions   Status Achieved   SLP SHORT TERM GOAL #2   Title Pt will mainitain average of 70dB during structured speech tasks and sentence level utteraces with occasional minc cues.   Time 2   Period Weeks   Status Achieved   SLP SHORT TERM GOAL #3   Title Pt will follow HEP for dysphagia with occasional min A as tolerated   Time 2   Period Weeks   Status Achieved          SLP Long Term Goals - 08/23/15 1351    SLP LONG TERM GOAL #1   Title Pt will maintain average of 70dB over 12 minute simple conversation with occasional min A  Time 5   Period Weeks   Status Achieved   SLP LONG TERM GOAL #2   Title Pt will be audible over 15 minute conversation in noisy environment/outside of therpay room with min rare A x 2 sessions   Time 5   Period Weeks   Status On-going   SLP LONG TERM GOAL #3   Title Pt will perfrom HEP for dysphagia with mod I as tolerated over two visits   Time 5   Period Weeks   Status On-going          Plan - 08/23/15 1349    Clinical Impression Statement Pt with cont success with louder intelligible speech outside therapy room for 14 minutes. His HEP was completed independently. Pt will be decr'd to x1/week due to progress.   Speech Therapy Frequency 1x /week   Duration 4 weeks  or 8 visits   Treatment/Interventions Aspiration precaution training;Pharyngeal strengthening exercises;SLP instruction and feedback;Compensatory  strategies;Internal/external aids;Environmental controls;Patient/family education;Functional tasks   Potential to Achieve Goals Good   Potential Considerations Severity of impairments;Co-morbidities        Problem List Patient Active Problem List   Diagnosis Date Noted  . Preventative health care 04/02/2015  . Constipation 04/02/2015  . 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 Dysphagia, neurologic 07/27/2014  . Paralysis agitans ([place]) 07/27/2014  . Dystonia 07/10/2014  . Low back pain 11/21/2011  . TRANSAMINASES, SERUM, ELEVATED 07/06/2008  . DIARRHEA 05/23/2008  . LYMPH NODE-ENLARGED 07/28/2007  . Non-Hodgkin lymphoma ([place]) 03/23/2007    [facility] , [place], CCC-SLP  08/23/2015, 1:52 PM  [facility] [address] [place], [place], [address] Phone: [phone]   Fax:  [phone]   Name: [name] [name] MRN: [id number] Date of Birth: [birth year]

## 2015-08-28 ENCOUNTER — Ambulatory Visit: Payer: BLUE CROSS/BLUE SHIELD

## 2015-08-28 DIAGNOSIS — R1312 Dysphagia, oropharyngeal phase: Secondary | ICD-10-CM

## 2015-08-28 DIAGNOSIS — R490 Dysphonia: Secondary | ICD-10-CM

## 2015-08-28 DIAGNOSIS — G20 Parkinson's disease: Secondary | ICD-10-CM

## 2015-08-28 NOTE — Therapy (Signed)
[facility] [address] [place], [place], [address] Phone: [phone]   Fax:  [phone]  Speech Language Pathology Treatment  Patient Details  Name: [name] [name] MRN: [id number] Date of Birth: 1951/04/12 Referring Provider: Dr. Wells Guiles Tat  Encounter Date: 08/28/2015      End of Session - 08/28/15 1318    Visit Number 10   Number of Visits 17   Date for SLP Re-Evaluation 09/13/15   SLP Start Time 1017   SLP Stop Time  1100   SLP Time Calculation (min) 43 min   Activity Tolerance Patient tolerated treatment well      Past Medical History  Diagnosis Date  . Dystonia 1994    diagnosed in [place]  . Non Hodgkin's lymphoma 2007    diffuse- 6 cycles of chemo with R CHOP Rituxan - last dose 10/08  . Cervical lymphadenopathy     right - followed my ENT  . Parkinson disease     2015    Past Surgical History  Procedure Laterality Date  . Splenectomy    . Cholecystectomy, laparoscopic    . Port-a-cath removal    . Portacath placement    . Tonsillectomy    . Eye surgery      x2 as child    There were no vitals filed for this visit.  Visit Diagnosis: Oropharyngeal dysphagia  Hypokinetic Parkinsonian dysphonia      Subjective Assessment - 08/28/15 1025    Subjective Pt has incr'd frequency of HEP since last visit.               ADULT SLP TREATMENT - 08/28/15 1027    General Information   Behavior/Cognition Alert;Cooperative;Pleasant mood   Treatment Provided   Treatment provided Dysphagia;Cognitive-Linquistic   Dysphagia Treatment   Temperature Spikes Noted No   Treatment Methods Therapeutic exercise   Other treatment/comments SLP assessed pt's performance with HEP. He req'd min A on [place] (tongue protrusion) and with fist push/chin resist (fist far enough back to have no TMJ issues).    Cognitive-Linquistic Treatment   Treatment focused on Dysarthria   Skilled Treatment In  conversation of 18 minutes pt maintained intelligible and adequately loud speech in a mod noisy environment. Outdoors, pt did the same, without decr in intelligibility.    Assessment / Recommendations / Plan   Plan --  d/c speech goals at this time   Progression Toward Goals   Progression toward goals Progressing toward goals          SLP Education - 08/28/15 1318    Education provided Yes   Education Details HEP ([place], fist/chin)   Person(s) Educated Patient   Methods Explanation;Demonstration;Verbal cues   Comprehension Verbalized understanding;Returned demonstration          SLP Short Term Goals - 08/23/15 1351    SLP SHORT TERM GOAL #1   Title Pt will achieve 90dB average on loud /a/ over 3 sessions   Status Achieved   SLP SHORT TERM GOAL #2   Title Pt will mainitain average of 70dB during structured speech tasks and sentence level utteraces with occasional minc cues.   Time 2   Period Weeks   Status Achieved   SLP SHORT TERM GOAL #3   Title Pt will follow HEP for dysphagia with occasional min A as tolerated   Time 2   Period Weeks   Status Achieved          SLP Long Term Goals -  08/28/15 1320    SLP LONG TERM GOAL #1   Title Pt will maintain average of 70dB over 12 minute simple conversation with occasional min A   Status Achieved   SLP LONG TERM GOAL #2   Title Pt will be audible over 15 minute conversation in noisy environment/outside of therpay room with min rare A x 2 sessions   Status Achieved   SLP LONG TERM GOAL #3   Title Pt will perfrom HEP for dysphagia with mod I as tolerated over two visits   Time 3   Period Weeks   Status On-going          Plan - 08/28/15 1318    Clinical Impression Statement Pt's speech is adequate/WNL. Speech goals met. SLP to cont with dysphagia goals. If pt maintains appropriate completion of HEP discharge next session possilble.   Speech Therapy Frequency 1x /week   Duration 4 weeks   3 weeks or 7 visits    Treatment/Interventions Aspiration precaution training;Pharyngeal strengthening exercises;SLP instruction and feedback;Compensatory strategies;Internal/external aids;Environmental controls;Patient/family education;Functional tasks   Potential to Achieve Goals Good   Potential Considerations Severity of impairments;Co-morbidities        Problem List Patient Active Problem List   Diagnosis Date Noted  . Preventative health care 04/02/2015  . Constipation 04/02/2015  . 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 Dysphagia, neurologic 07/27/2014  . Paralysis agitans ([place]) 07/27/2014  . Dystonia 07/10/2014  . Low back pain 11/21/2011  . TRANSAMINASES, SERUM, ELEVATED 07/06/2008  . DIARRHEA 05/23/2008  . LYMPH NODE-ENLARGED 07/28/2007  . Non-Hodgkin lymphoma ([place]) 03/23/2007    [facility] , [place], [place]  08/28/2015, 1:21 PM  [facility] [address] [place], [place], [address] Phone: [phone]   Fax:  [phone]   Name: [name] [name] MRN: [id number] Date of Birth: 03-Jan-1951

## 2015-09-03 ENCOUNTER — Encounter: Payer: Self-pay | Admitting: Neurology

## 2015-09-03 ENCOUNTER — Ambulatory Visit (INDEPENDENT_AMBULATORY_CARE_PROVIDER_SITE_OTHER): Payer: BLUE CROSS/BLUE SHIELD | Admitting: Neurology

## 2015-09-03 VITALS — BP 98/66 | HR 73 | Ht 66.0 in | Wt 186.0 lb

## 2015-09-03 DIAGNOSIS — E538 Deficiency of other specified B group vitamins: Secondary | ICD-10-CM

## 2015-09-03 DIAGNOSIS — G244 Idiopathic orofacial dystonia: Secondary | ICD-10-CM | POA: Diagnosis not present

## 2015-09-03 DIAGNOSIS — G3184 Mild cognitive impairment, so stated: Secondary | ICD-10-CM

## 2015-09-03 DIAGNOSIS — G20 Parkinson's disease: Secondary | ICD-10-CM | POA: Diagnosis not present

## 2015-09-03 NOTE — Progress Notes (Signed)
[name] [name] was seen today in the movement disorders clinic for neurologic consultation at the request of [name], DO.  The consultation is for the evaluation of tremor.  This patient is accompanied in the office by his spouse who supplements the history.    The pt was dx with dystonia in [place] in 1994.  He presented to the speech pathologist and was dx with spasmodic dysphonia.  He was then referred to the neurologist who confirmed the diagnosis and did and MRI of the brain that was normal.  He had botox in to the vocal cords and that was helpful.  He would lose the voice for 6 weeks after the injections and then would get the voice back for 6 weeks.  He would go back to [place] twice per year for the injections.  He hasn't had the injections for the last 2 years as he quit working.  The spasmodic dysphonia is worse on the phone.  Last ST was 1995.  Having some trouble with swallowing.  Had developed some movements in the face and tried botox there but didn't really help there.  Has had some neck extension as well with the dystonic movements.  Wife states that these sx's have been better since retirement; he is under less stress.  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  The patient reports that [name] tremor started in the thumb in may and has slowly gotten worse and 3 weeks ago, he noted just a little in the [name].  He notes the tremor most at rest.  09/26/14 update:  The patient returns today for follow-up.  The patient has a history of Meige's syndrome, but presented last time with parkinsonism, but did not meet all the criteria for idiopathic Parkinson's disease.  Nonetheless, the patient wanted to start on medication.  We started him on Mirapex ER and he has worked his way up to 1.5 mg daily.  The patient reports that he is about 50% better.  He states that it has helped tremor but it isn't gone.  His wife thinks that mirapex is disrupting his sleep.   He has constipation and thought that it  was the mirapex.  His balance has been good.   He had an MRI of the brain since last visit and that was unremarkable.  Because of significant dysphagia from his Meige syndrome he had a modified barium swallow on 08/07/2014 and that revealed mild oral dysphagia, moderate pharyngeal phase dysphagia and a regular diet with thin liquids was recommended.   Has a strange sensation under the bottom of the foot like something is under the ball of the foot.  Has had paresthesias off and on for a long time.  Did get chemo with nonHodgkins and been on B6 since.  01/02/15 update:  The patient is following up today, accompanied by his wife is supplements the history.  The patient is on Mirapex ER, 1.5 mg daily.  Overall, the patient states that he is doing well.  No falls.  No hallucinations.  He continues to have issues with dysphagia, but that is primarily from his long history of Meige syndrome.  He continues to not need much sleep at night but it is not transitioning into daytime sleepiness.    05/03/15 update:  The patient returns today for follow-up, accompanied by his wife who supplements the history.  The patient is on pramipexole ER, 1.5 mg daily. He is doing well on it. He states that  he was exercising but he "fell off the wagon."   He was referred last visit for physical and speech therapy. He states that he never got a call in that regard.   He does have a history of Meige syndrome, which is the primarily etiology for the dysphagia and some of the speech issues.  Last visit, I did ask him to add B12 supplementation, as his B12 was on the low end of normal.  He has been taking his B12 supplement faithfully.  He already he has peripheral neuropathy because of his history of chemotherapy.   Pt states that he is sleeping better than last visit.    09/03/15 update:  The patient follows up today, accompanied by his wife who supplements the history.  He remains on pramipexole ER, 1.5 mg daily.  He has completed physical  therapy and remains in speech therapy.  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  He is doing his PT at home.  Hasn't been faithful with CV exercise.  Has just started melatonin for insomnia and thinks it is helping.   Overall, the patient has been stable.  He has not experienced any falls.  No hallucinations.  No lightheadedness or near syncope.  He has not choked on any of his foods.  His wife is noting some short term memory issues - he forgot to shut garage door and one time forgot to take mirapex.  Pt noted that he had bad RLS when he did that.     ALLERGIES:  No Known Allergies  CURRENT MEDICATIONS:  Outpatient Encounter Prescriptions as of 09/03/2015  Medication Sig  . aspirin 81 MG chewable tablet Chew 81 mg by mouth daily.  . Cholecalciferol (VITAMIN D3) 3000 UNITS TABS Take 1,000 Units by mouth.   . cyanocobalamin 100 MCG tablet Take 1,000 mcg by mouth daily.   . Melatonin 3 MG TABS Take by mouth at bedtime.  . Pramipexole Dihydrochloride (MIRAPEX ER) 1.5 MG TB24 Take 1 tablet (1.5 mg total) by mouth daily.  . [DISCONTINUED] NON FORMULARY Beta Glucan 3 mg daily   No facility-administered encounter medications on file as of 09/03/2015.    PAST MEDICAL HISTORY:   Past Medical History  Diagnosis Date  . Dystonia 1994    diagnosed in [place]  . Non Hodgkin's lymphoma (HCC) 2007    diffuse- 6 cycles of chemo with R CHOP Rituxan - last dose 10/08  . Cervical lymphadenopathy     right - followed my ENT  . Parkinson disease (HCC)     20" 15    PAST SURGICAL HISTORY:   Past Surgical History  Procedure Laterality Date  . Splenectomy    . Cholecystectomy, laparoscopic    . Port-a-cath removal    . Portacath placement    . Tonsillectomy    . Eye surgery      x2 as child    SOCIAL HISTORY:   Social History   Social History  . Marital Status: Married    Spouse Name: N/A  . Number of Children: N/A  . Years of  Education: N/A   Occupational History  . Not on file.   Social History Main Topics  . Smoking status: Never Smoker   . Smokeless tobacco: Never Used  . Alcohol Use: No  . Drug Use: No  . Sexual Activity: Not on file   Other Topics Concern  . Not on file   Social History Narrative  FAMILY HISTORY:   Family Status  Relation Status Death Age  . Mother Deceased     heart disease, lung cancer  . Father Deceased     heart attack  . Brother Deceased     colon cancer  . Sister Alive     unknown  . Son Alive     anemia  . Son Alive     healthy  . Daughter Alive     healthy  . Son Deceased     debrancher's enzyme disease  . Son Deceased     debrancher's enzyme disease    ROS:  A complete 10 system review of systems was obtained and was unremarkable apart from what is mentioned above.  PHYSICAL EXAMINATION:    VITALS:   Filed Vitals:   09/03/15 0811  BP: 98/66  Pulse: 73  Height: 5\' 6"  (1.676 m)  Weight: 186 lb (84.369 kg)    GEN:  The patient appears stated age and is in NAD. HEENT:  Normocephalic, atraumatic.  The mucous membranes are moist. The superficial temporal arteries are without ropiness or tenderness. CV:  RRR Lungs:  CTAB Neck/HEME:  There are no carotid bruits bilaterally.  Neurological examination:  Orientation: The patient is alert and oriented x3. Fund of knowledge is appropriate.  Recent and remote memory are intact.  Attention and concentration are normal.    Able to name objects and repeat phrases. Cranial nerves: There is good facial symmetry.  Slight facial hypomimia.  Mild oromandibular dystonia. Pupils are equal round and reactive to light bilaterally. Fundoscopic exam reveals clear margins bilaterally. There is L exotropia (chronic). The visual fields are full to confrontational testing. The speech bulbar in quality and signifcant spasmodic dysphonia.   Soft palate rises symmetrically and there is no tongue deviation. Hearing is intact to  conversational tone. Sensation: Sensation is intact to light touch throughout. Motor: Strength is 5/5 in the bilateral upper and lower extremities.   Shoulder shrug is equal and symmetric.  There is no pronator drift.   Movement examination: Tone: There is normal tone in the bilateral upper extremities.  The tone in the lower extremities is normal.  Abnormal movements: There is mild-mod, near constant [name] resting tremor that increases with ambulation and distraction Coordination:  There is no decremation with rapid alternating movements. Gait and Station: The patient has no difficulty arising out of a deep-seated chair without the use of the hands. The patient's stride length is normal.  The patient has a negative pull test.     Lab Results  Component Value Date   VITAMINB12 292 09/26/2014   .   Chemistry      Component Value Date/Time   NA 142 03/27/2015 0824   K 4.1 03/27/2015 0824   CL 105 03/27/2015 0824   CO2 26 03/27/2015 0824   BUN 13 03/27/2015 0824   CREATININE 1.02 03/27/2015 0824   CREATININE 0.94 09/26/2014 0925      Component Value Date/Time   CALCIUM 9.2 03/27/2015 0824   ALKPHOS 52 03/27/2015 0824   ALKPHOS 52 03/27/2015 0824   AST 23 03/27/2015 0824   AST 23 03/27/2015 0824   ALT 37 03/27/2015 0824   ALT 37 03/27/2015 0824   BILITOT 0.7 03/27/2015 0824   BILITOT 0.7 03/27/2015 0824     Lab Results  Component Value Date   WBC 11.6* 03/27/2015   HGB 15.9 03/27/2015   HCT 47.8 03/27/2015   MCV 97.7 03/27/2015   PLT 284.0 03/27/2015  ASSESSMENT/PLAN:  1.  Parkinsonism  -Pt likely has early PD, but doesn't meet all criteria yet according to the brain bank criteria, but likely does have early PD. Even though he has prominent facial dystonia (meige syndrome), I don't think that this is related as this started in 1994.  He has found Mirapex to be helpful.   He does think that it has helped tremor but not fully but we both decided it would not be worth  increasing the dosage. He has no compulsive side effects with the Mirapex.   -Talked about the importance of resuming cardiovascular exercise as he is not consistent with that.  Greater than 50% of the 25 minute visit in counseling in this regard.  -wife has noted some memory change but I see no evidence of dementia.  May have very mild MCI and we discussed this as well as multitasking problems that can come along with PD and coping mechanisms. 2.  Dysphagia  -primarily related to Meige syndrome.  -he had a modified barium swallow on 08/07/2014 and that revealed mild oral dysphagia, moderate pharyngeal phase dysphagia and a regular diet with thin liquids was recommended. 3.  Paresthesias.  -Probable due to peripheral neuropathy, which is likely due to his history of chemotherapy from non-Hodgkin's lymphoma.    -His B12 has been on the low end, but he has been faithfully taking a supplement.   4.  Constipation  -He has a copy of rancho recipe 5.  Follow-up in the next few months (4 months), sooner should new neurologic issues arise.

## 2015-09-04 ENCOUNTER — Ambulatory Visit: Payer: BLUE CROSS/BLUE SHIELD

## 2015-09-04 DIAGNOSIS — G20 Parkinson's disease: Secondary | ICD-10-CM

## 2015-09-04 DIAGNOSIS — R490 Dysphonia: Secondary | ICD-10-CM

## 2015-09-04 DIAGNOSIS — R1312 Dysphagia, oropharyngeal phase: Secondary | ICD-10-CM | POA: Diagnosis not present

## 2015-09-04 NOTE — Therapy (Signed)
[facility] [address] [place], [place], [address] Phone: [phone]   Fax:  [phone]  Speech Language Pathology Treatment  Patient Details  Name: [name] [name] MRN: [id number] Date of Birth: [birth year] Referring Provider: Dr. Wells Guiles Tat  Encounter Date: 09/04/2015      End of Session - 09/04/15 1111    Visit Number 11   Number of Visits 17   Date for SLP Re-Evaluation 09/13/15   SLP Start Time 1024   SLP Stop Time  1102   SLP Time Calculation (min) 38 min   Activity Tolerance Patient tolerated treatment well      Past Medical History  Diagnosis Date  . Dystonia 1994    diagnosed in [place]  . Non Hodgkin's lymphoma ([place]) 2007    diffuse- 6 cycles of chemo with R CHOP Rituxan - last dose 10/08  . Cervical lymphadenopathy     right - followed my ENT  . Parkinson disease ([place])     2015    Past Surgical History  Procedure Laterality Date  . Splenectomy    . Cholecystectomy, laparoscopic    . Port-a-cath removal    . Portacath placement    . Tonsillectomy    . Eye surgery      x2 as child    There were no vitals filed for this visit.  Visit Diagnosis: Oropharyngeal dysphagia  Hypokinetic Parkinsonian dysphonia             ADULT SLP TREATMENT - 09/04/15 1030    General Information   Behavior/Cognition Alert;Cooperative;Pleasant mood   Treatment Provided   Treatment provided Dysphagia   Dysphagia Treatment   Temperature Spikes Noted No   Treatment Methods Therapeutic exercise   Other treatment/comments Pt performed HEP with independence. Hold times and procedures were all as prescribed for HEP. Pt ate POs and demonstrated compensations with independence.    Pain Assessment   Pain Assessment No/denies pain   Assessment / Recommendations / Plan   Plan Discharge SLP treatment due to (comment)  goals met   Dysphagia Recommendations   Diet recommendations Regular;Thin liquid   Compensations Slow rate;Small sips/bites;Multiple dry swallows after each bite/sip;Follow solids with liquid;Effortful swallow   Progression Toward Goals   Progression toward goals Goals met, education completed, patient discharged from [facility] - 08/23/15 1351    SLP [place] #1   Title Pt will achieve 90dB average on loud /a/ over 3 sessions   Status Achieved   SLP SHORT TERM GOAL #2   Title Pt will mainitain average of 70dB during structured speech tasks and sentence level utteraces with occasional minc cues.   Time 2   Period Weeks   Status Achieved   SLP SHORT TERM GOAL #3   Title Pt will follow HEP for dysphagia with occasional min A as tolerated   Time 2   Period Weeks   Status Achieved          SLP Long Term Goals - 09/04/15 1113    SLP LONG TERM GOAL #1   Title Pt will maintain average of 70dB over 12 minute simple conversation with occasional min A   Status Achieved   SLP LONG TERM GOAL #2   Title Pt will be audible over 15 minute conversation in noisy environment/outside of therpay room with min rare A x 2 sessions   Status Achieved   SLP  LONG TERM GOAL #3   Title Pt will perfrom HEP for dysphagia with mod I as tolerated over two visits   Time 3   Period Weeks   Status Achieved          Plan - 09/04/15 1112    Clinical Impression Statement Dysphagia HEP completed today independently. Pt also ate POs independently for compensations. Pt appropriate for d/c at this time.   Potential to Achieve Goals Good     SPEECH THERAPY DISCHARGE SUMMARY  Visits from Start of Care: 11  Current functional level related to goals / functional outcomes: Pt met all STGs and LTGs. His volume has increased to WNL, and he is independent with his dysphagia HEP at this time. He followed swallow precautions without cues necessary in his last session. Pt is appropriate for d/c at this time. See goals above for more details.   Remaining  deficits: Mild dysarthria, and dysphagia due to premorbid dystonia.  SLP recommends follow up modified barium swallow (MBSS) in late January 2017.   Education / Equipment: HEP for dysphagia, loud /a/, need for incr'd effort with speech for improved intelligibility. Plan: Patient agrees to discharge.  Patient goals were met. Patient is being discharged due to meeting the stated rehab goals.  ?????        Problem List Patient Active Problem List   Diagnosis Date Noted  . Preventative health care 04/02/2015  . Constipation 04/02/2015  . 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 Dysphagia, neurologic 07/27/2014  . Paralysis agitans ([place]) 07/27/2014  . Dystonia 07/10/2014  . Low back pain 11/21/2011  . TRANSAMINASES, SERUM, ELEVATED 07/06/2008  . DIARRHEA 05/23/2008  . LYMPH NODE-ENLARGED 07/28/2007  . Non-Hodgkin lymphoma ([place]) 03/23/2007    [facility] , [place], CCC-SLP  09/04/2015, 11:14 AM  [facility] [address] [place], [place], [address] Phone: [phone]   Fax:  [phone]   Name: XYLON CROOM MRN: [id number] Date of Birth: 1951-01-10

## 2015-09-12 ENCOUNTER — Other Ambulatory Visit: Payer: Self-pay | Admitting: Neurology

## 2015-09-13 NOTE — Telephone Encounter (Signed)
Mirapex refill requested. Per last office note- patient to remain on medication. Refill approved and sent to patient's pharmacy.   

## 2015-10-04 ENCOUNTER — Other Ambulatory Visit (HOSPITAL_COMMUNITY): Payer: Self-pay | Admitting: Neurology

## 2015-10-04 ENCOUNTER — Telehealth: Payer: Self-pay | Admitting: Neurology

## 2015-10-04 DIAGNOSIS — R1319 Other dysphagia: Secondary | ICD-10-CM

## 2015-10-04 DIAGNOSIS — R1310 Dysphagia, unspecified: Secondary | ICD-10-CM

## 2015-10-04 NOTE — Telephone Encounter (Signed)
Patient made aware of following information:  We have scheduled you at [facility] for your modified barium swallow on 10/11/15 at 1:00 pm. Please arrive 15 minutes prior and go to 1st floor radiology. If you need to reschedule for any reason please call [phone].

## 2015-10-04 NOTE — Telephone Encounter (Signed)
Patient made aware repeat MBE was recommended by speech therapy.

## 2015-10-11 ENCOUNTER — Ambulatory Visit (HOSPITAL_COMMUNITY): Admission: RE | Admit: 2015-10-11 | Payer: BLUE CROSS/BLUE SHIELD | Source: Ambulatory Visit

## 2015-10-11 ENCOUNTER — Other Ambulatory Visit (HOSPITAL_COMMUNITY): Payer: BLUE CROSS/BLUE SHIELD

## 2015-10-18 ENCOUNTER — Ambulatory Visit (HOSPITAL_COMMUNITY)
Admission: RE | Admit: 2015-10-18 | Discharge: 2015-10-18 | Disposition: A | Discharge status: Home / Self Care | Payer: BLUE CROSS/BLUE SHIELD | Source: Ambulatory Visit | Attending: Neurology | Admitting: Neurology

## 2015-10-18 DIAGNOSIS — R1310 Dysphagia, unspecified: Secondary | ICD-10-CM | POA: Insufficient documentation

## 2015-10-18 DIAGNOSIS — F458 Other somatoform disorders: Secondary | ICD-10-CM | POA: Diagnosis not present

## 2015-10-18 DIAGNOSIS — R1313 Dysphagia, pharyngeal phase: Secondary | ICD-10-CM | POA: Insufficient documentation

## 2015-10-18 DIAGNOSIS — R1319 Other dysphagia: Secondary | ICD-10-CM

## 2015-11-22 ENCOUNTER — Encounter: Payer: Self-pay | Admitting: Internal Medicine

## 2016-01-04 ENCOUNTER — Ambulatory Visit: Payer: BLUE CROSS/BLUE SHIELD | Admitting: Neurology

## 2016-01-08 ENCOUNTER — Ambulatory Visit (INDEPENDENT_AMBULATORY_CARE_PROVIDER_SITE_OTHER): Payer: BLUE CROSS/BLUE SHIELD | Admitting: Neurology

## 2016-01-08 ENCOUNTER — Encounter: Payer: Self-pay | Admitting: Neurology

## 2016-01-08 VITALS — BP 126/60 | HR 70 | Ht 66.0 in | Wt 185.0 lb

## 2016-01-08 DIAGNOSIS — G20 Parkinson's disease: Secondary | ICD-10-CM

## 2016-01-08 NOTE — Progress Notes (Signed)
[name] [name] was seen today in the movement disorders clinic for neurologic consultation at the request of [name], DO.  The consultation is for the evaluation of tremor.  This patient is accompanied in the office by his spouse who supplements the history.    The pt was dx with dystonia in [place] in 1994.  He presented to the speech pathologist and was dx with spasmodic dysphonia.  He was then referred to the neurologist who confirmed the diagnosis and did and MRI of the brain that was normal.  He had botox in to the vocal cords and that was helpful.  He would lose the voice for 6 weeks after the injections and then would get the voice back for 6 weeks.  He would go back to [place] twice per year for the injections.  He hasn't had the injections for the last 2 years as he quit working.  The spasmodic dysphonia is worse on the phone.  Last ST was 1995.  Having some trouble with swallowing.  Had developed some movements in the face and tried botox there but didn't really help there.  Has had some neck extension as well with the dystonic movements.  Wife states that these sx's have been better since retirement; he is under less stress.  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  The patient reports that [name] tremor started in the thumb in may and has slowly gotten worse and 3 weeks ago, he noted just a little in the [name].  He notes the tremor most at rest.  09/26/14 update:  The patient returns today for follow-up.  The patient has a history of Meige's syndrome, but presented last time with parkinsonism, but did not meet all the criteria for idiopathic Parkinson's disease.  Nonetheless, the patient wanted to start on medication.  We started him on Mirapex ER and he has worked his way up to 1.5 mg daily.  The patient reports that he is about 50% better.  He states that it has helped tremor but it isn't gone.  His wife thinks that mirapex is disrupting his sleep.   He has constipation and thought that it  was the mirapex.  His balance has been good.   He had an MRI of the brain since last visit and that was unremarkable.  Because of significant dysphagia from his Meige syndrome he had a modified barium swallow on 08/07/2014 and that revealed mild oral dysphagia, moderate pharyngeal phase dysphagia and a regular diet with thin liquids was recommended.   Has a strange sensation under the bottom of the foot like something is under the ball of the foot.  Has had paresthesias off and on for a long time.  Did get chemo with nonHodgkins and been on B6 since.  01/02/15 update:  The patient is following up today, accompanied by his wife is supplements the history.  The patient is on Mirapex ER, 1.5 mg daily.  Overall, the patient states that he is doing well.  No falls.  No hallucinations.  He continues to have issues with dysphagia, but that is primarily from his long history of Meige syndrome.  He continues to not need much sleep at night but it is not transitioning into daytime sleepiness.    05/03/15 update:  The patient returns today for follow-up, accompanied by his wife who supplements the history.  The patient is on pramipexole ER, 1.5 mg daily. He is doing well on it. He states that  he was exercising but he "fell off the wagon."   He was referred last visit for physical and speech therapy. He states that he never got a call in that regard.   He does have a history of Meige syndrome, which is the primarily etiology for the dysphagia and some of the speech issues.  Last visit, I did ask him to add B12 supplementation, as his B12 was on the low end of normal.  He has been taking his B12 supplement faithfully.  He already he has peripheral neuropathy because of his history of chemotherapy.   Pt states that he is sleeping better than last visit.    09/03/15 update:  The patient follows up today, accompanied by his wife who supplements the history.  He remains on pramipexole ER, 1.5 mg daily.  He has completed physical  therapy and remains in speech therapy.  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  He is doing his PT at home.  Hasn't been faithful with CV exercise.  Has just started melatonin for insomnia and thinks it is helping.   Overall, the patient has been stable.  He has not experienced any falls.  No hallucinations.  No lightheadedness or near syncope.  He has not choked on any of his foods.  His wife is noting some short term memory issues - he forgot to shut garage door and one time forgot to take mirapex.  Pt noted that he had bad RLS when he did that.    01/08/16 update:  The patient follows up today, accompanied by his wife who supplements the history.  He is on Mirapex ER, 1.5 g daily.  He denies compulsive behaviors.  No falls since last visit.  He does have tremor, but not bothersome enough to change his medication.  No hallucinations.  No lightheadedness or near syncope.  He did have a modified barium swallow on 10/18/2015.  There is mild oral and moderate pharyngeal dysphagia.  This was worse with solids than liquids.  The patient felt that this was actually better than his last study.  There is no dietary changes recommended interventions recommended that he eat slowly, take small bites and drink plenty of liquids after his small solid bites.   ALLERGIES:  No Known Allergies  CURRENT MEDICATIONS:  Outpatient Encounter Prescriptions as of 01/08/2016  Medication Sig  . aspirin 81 MG chewable tablet Chew 81 mg by mouth daily.  . Cholecalciferol (VITAMIN D3) 3000 UNITS TABS Take 1,000 Units by mouth.   . cyanocobalamin 100 MCG tablet Take 1,000 mcg by mouth daily.   . Melatonin 3 MG TABS Take by mouth at bedtime.  . Pramipexole Dihydrochloride 1.5 MG TB24 TAKE 1 TABLET DAILY   No facility-administered encounter medications on file as of 01/08/2016.    PAST MEDICAL HISTORY:   Past Medical History  Diagnosis Date  . Dystonia 1994     diagnosed in [place]  . Non Hodgkin's lymphoma (HCC) 2007    diffuse- 6 cycles of chemo with R CHOP Rituxan - last dose 10/08  . Cervical lymphadenopathy     right - followed my ENT  . Parkinson disease (HCC)     20" 15    PAST SURGICAL HISTORY:   Past Surgical History  Procedure Laterality Date  . Splenectomy    . Cholecystectomy, laparoscopic    . Port-a-cath removal    . Portacath placement    . Tonsillectomy    .  Eye surgery      x2 as child    SOCIAL HISTORY:   Social History   Social History  . Marital Status: Married    Spouse Name: N/A  . Number of Children: N/A  . Years of Education: N/A   Occupational History  . Not on file.   Social History Main Topics  . Smoking status: Never Smoker   . Smokeless tobacco: Never Used  . Alcohol Use: No  . Drug Use: No  . Sexual Activity: Not on file   Other Topics Concern  . Not on file   Social History Narrative    FAMILY HISTORY:   Family Status  Relation Status Death Age  . Mother Deceased     heart disease, lung cancer  . Father Deceased     heart attack  . Brother Deceased     colon cancer  . Sister Alive     unknown  . Son Alive     anemia  . Son Alive     healthy  . Daughter Alive     healthy  . Son Deceased     debrancher's enzyme disease  . Son Deceased     debrancher's enzyme disease    ROS:  A complete 10 system review of systems was obtained and was unremarkable apart from what is mentioned above.  PHYSICAL EXAMINATION:    VITALS:   There were no vitals filed for this visit.  GEN:  The patient appears stated age and is in NAD. HEENT:  Normocephalic, atraumatic.  The mucous membranes are moist. The superficial temporal arteries are without ropiness or tenderness. CV:  RRR Lungs:  CTAB Neck/HEME:  There are no carotid bruits bilaterally.  Neurological examination:  Orientation: The patient is alert and oriented x3.  Cranial nerves: There is good facial symmetry.  Slight facial  hypomimia.  Mild oromandibular dystonia. Pupils are equal round and reactive to light bilaterally. Fundoscopic exam reveals clear margins bilaterally. There is L exotropia (chronic). The visual fields are full to confrontational testing. The speech bulbar in quality and signifcant spasmodic dysphonia.   Soft palate rises symmetrically and there is no tongue deviation. Hearing is intact to conversational tone. Sensation: Sensation is intact to light touch throughout. Motor: Strength is 5/5 in the bilateral upper and lower extremities.   Shoulder shrug is equal and symmetric.  There is no pronator drift.   Movement examination: Tone: There is normal tone in the bilateral upper extremities.  The tone in the lower extremities is normal.  Abnormal movements: There is mild intermittent resting tremor of the [name] Coordination:  There is no decremation with rapid alternating movements. Gait and Station: The patient has no difficulty arising out of a deep-seated chair without the use of the hands. The patient's stride length is normal.  The patient has a negative pull test.     Lab Results  Component Value Date   VITAMINB12 292 09/26/2014   .   Chemistry      Component Value Date/Time   NA 142 03/27/2015 0824   K 4.1 03/27/2015 0824   CL 105 03/27/2015 0824   CO2 26 03/27/2015 0824   BUN 13 03/27/2015 0824   CREATININE 1.02 03/27/2015 0824   CREATININE 0.94 09/26/2014 0925      Component Value Date/Time   CALCIUM 9.2 03/27/2015 0824   ALKPHOS 52 03/27/2015 0824   ALKPHOS 52 03/27/2015 0824   AST 23 03/27/2015 0824   AST 23 03/27/2015 0824  ALT 37 03/27/2015 0824   ALT 37 03/27/2015 0824   BILITOT 0.7 03/27/2015 0824   BILITOT 0.7 03/27/2015 0824     Lab Results  Component Value Date   WBC 11.6* 03/27/2015   HGB 15.9 03/27/2015   HCT 47.8 03/27/2015   MCV 97.7 03/27/2015   PLT 284.0 03/27/2015       ASSESSMENT/PLAN:  1.  Mild PD  -Even though he has prominent facial  dystonia (meige syndrome), I don't think that this is related as this started in 1994.  He has found Mirapex to be helpful.   He does think that it has helped tremor but not fully but we both decided it would not be worth increasing the dosage. He has no compulsive side effects with the Mirapex.   -Talked about the importance of resuming cardiovascular exercise as he is not consistent with that.  Greater than 50% of the 25 minute visit in counseling in this regard.  -Talked to him about exercise.  He is not doing this.  Talked to him about the new biking program at the [facility] and gave him information on this. 2.  Dysphagia  -primarily related to Meige syndrome.  -He did have a modified barium swallow on 10/18/2015.  There is mild oral and moderate pharyngeal dysphagia.  This was worse with solids than liquids.  3.  Paresthesias.  -Probable due to peripheral neuropathy, which is likely due to his history of chemotherapy from non-Hodgkin's lymphoma.    -His B12 has been on the low end, but he has been faithfully taking a supplement.   4.  Constipation  -He has a copy of rancho recipe 5.  Follow-up in the next few months (3-4 months), sooner should new neurologic issues arise.

## 2016-02-12 ENCOUNTER — Ambulatory Visit: Payer: BLUE CROSS/BLUE SHIELD

## 2016-02-12 ENCOUNTER — Ambulatory Visit: Payer: BLUE CROSS/BLUE SHIELD | Admitting: Occupational Therapy

## 2016-02-12 ENCOUNTER — Ambulatory Visit: Payer: BLUE CROSS/BLUE SHIELD | Admitting: Physical Therapy

## 2016-02-28 ENCOUNTER — Ambulatory Visit: Payer: BLUE CROSS/BLUE SHIELD | Attending: Internal Medicine

## 2016-02-28 ENCOUNTER — Ambulatory Visit: Payer: BLUE CROSS/BLUE SHIELD | Admitting: Physical Therapy

## 2016-02-28 ENCOUNTER — Ambulatory Visit: Payer: BLUE CROSS/BLUE SHIELD | Admitting: Occupational Therapy

## 2016-02-28 DIAGNOSIS — R471 Dysarthria and anarthria: Secondary | ICD-10-CM | POA: Insufficient documentation

## 2016-02-28 DIAGNOSIS — R2689 Other abnormalities of gait and mobility: Secondary | ICD-10-CM

## 2016-02-28 DIAGNOSIS — R278 Other lack of coordination: Secondary | ICD-10-CM

## 2016-02-28 NOTE — Therapy (Signed)
[facility] [address] [place], [place], [address] Phone: [phone]   Fax:  [phone]  Patient Details  Name: [name] [name] MRN: [id number] Date of Birth: 12-22-1950 Referring Provider: Alonza Bogus, DO  Encounter Date: 02/28/2016  Speech Therapy Parkinson's Disease Screen   Decibel Level today: 70dB  (WNL=70-72 dB) but more widely variable in volume range, with sound level meter 30cm away from pt's mouth. Pt's conversational volume has slightly decr'd since last treatment course.  Pt has/has not experienced difficulty in swallowing warranting objective evaluation.  Pt would benefit from speech-language eval for dysarthria- please order via EPIC or call [phone] to schedule   [facility] ,[place], [place]  02/28/2016, 8:31 AM  [facility] [address] [place], [place], [address] Phone: [phone]   Fax:  [phone]

## 2016-02-28 NOTE — Therapy (Signed)
[facility] [address] [place], [place], [address] Phone: [phone]   Fax:  [phone]  Patient Details  Name: [name] [name] MRN: [id number] Date of Birth: June 12, 1951 Referring Provider:  Shawna Orleans, Doe-Hyun R, DO  Encounter Date: 02/28/2016  Physical Therapy Parkinson's Disease Screen   Timed Up and Go test:9.41 sec  10 meter walk test:7.35 (4.46 ft/sec)  5 time sit to stand test:13.15 sec    Patient does not require Physical Therapy services at this time.  Recommend Physical Therapy screen in 6-9 months.    Yanky Vanderburg W. 02/28/2016, 8:42 AM  Frazier Butt., PT  [facility] [address] [place], [place], [address] Phone: [phone]   Fax:  [phone]

## 2016-02-28 NOTE — Therapy (Addendum)
[facility] [address] [place], [place], [address] Phone: [phone]   Fax:  [phone]  Occupational Therapy Treatment  Patient Details  Name: [name] [name] MRN: [id number] Date of Birth: 11-22-1950 No Data Recorded  Encounter Date: 02/28/2016    Past Medical History  Diagnosis Date  . Dystonia 1994    diagnosed in [place]  . Non Hodgkin's lymphoma ([place]) 2007    diffuse- 6 cycles of chemo with R CHOP Rituxan - last dose 10/08  . Cervical lymphadenopathy     right - followed my ENT  . Parkinson disease ([place] [facility])     2015    Past Surgical History  Procedure Laterality Date  . Splenectomy    . Cholecystectomy, laparoscopic    . Port-a-cath removal    . Portacath placement    . Tonsillectomy    . Eye surgery      x2 as child    There were no vitals filed for this visit.                            Occupational Therapy Parkinson's Disease Screen  Physical Performance Test item #2 (simulated eating):  19.13sec  Physical Performance Test item #4 (donning/doffing jacket):  11.74sec  9-hole peg test:    [name]  29.28 sec        LUE  27.78 sec   Change in ability to perform ADLs/IADLs:  Pt reports difficulties with fastening buttons, and severe micrographia. Pt would benefit from an occupational therapy evaluation due to  Decline in ADL status, decreased fine motor coordination.              Patient will benefit from skilled therapeutic intervention in order to improve the following deficits and impairments:     Visit Diagnosis: No diagnosis found.    Problem List Patient Active Problem List   Diagnosis Date Noted  . Preventative health care 04/02/2015  . Constipation 04/02/2015  . 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 Dysphagia, neurologic 07/27/2014  . Paralysis agitans ([place]) 07/27/2014  .  Dystonia 07/10/2014  . Low back pain 11/21/2011  . TRANSAMINASES, SERUM, ELEVATED 07/06/2008  . DIARRHEA 05/23/2008  . LYMPH NODE-ENLARGED 07/28/2007  . Non-Hodgkin lymphoma ([place]) 03/23/2007    RINE,KATHRYN 02/28/2016, 8:29 AM Theone Murdoch, OTR/L [phone] Phone: [phone] 11:40 AM 02/28/2016 [facility] [address] [place], [place], [address] Phone: [phone]   Fax:  [phone]  Name: [name] MRN: [id number] Date of Birth: [birth year]

## 2016-03-04 ENCOUNTER — Telehealth: Payer: Self-pay | Admitting: Neurology

## 2016-03-04 DIAGNOSIS — G20 Parkinson's disease: Secondary | ICD-10-CM

## 2016-03-04 NOTE — Telephone Encounter (Signed)
Order entered

## 2016-03-04 NOTE — Telephone Encounter (Signed)
-----   Message from [place], DO sent at 03/04/2016  7:37 AM EDT -----   ----- Message -----    From: Sharen Counter, CCC-SLP    Sent: 03/03/2016   5:25 PM      To: Hulda Marin, OT, Eustace Quail Tat, DO  Dr. Carles Collet- Mr. Iddings had a multi-d screen on Thursday and we are recommending OT and ST evals. OT - decr in ADL function ST - reduction in speech volume since d/c.  If agreed, please order via EPIC. Thanks!!  Glendell Docker

## 2016-04-11 ENCOUNTER — Encounter: Payer: Self-pay | Admitting: Neurology

## 2016-04-11 ENCOUNTER — Ambulatory Visit (INDEPENDENT_AMBULATORY_CARE_PROVIDER_SITE_OTHER): Payer: BLUE CROSS/BLUE SHIELD | Admitting: Neurology

## 2016-04-11 VITALS — BP 102/64 | HR 70 | Ht 66.0 in | Wt 182.0 lb

## 2016-04-11 DIAGNOSIS — G244 Idiopathic orofacial dystonia: Secondary | ICD-10-CM | POA: Diagnosis not present

## 2016-04-11 DIAGNOSIS — E538 Deficiency of other specified B group vitamins: Secondary | ICD-10-CM | POA: Diagnosis not present

## 2016-04-11 DIAGNOSIS — G20 Parkinson's disease: Secondary | ICD-10-CM

## 2016-04-11 NOTE — Progress Notes (Signed)
[name] [name] was seen today in the movement disorders clinic for neurologic consultation at the request of [name], DO.  The consultation is for the evaluation of tremor.  This patient is accompanied in the office by his spouse who supplements the history.    The pt was dx with dystonia in [place] in 1994.  He presented to the speech pathologist and was dx with spasmodic dysphonia.  He was then referred to the neurologist who confirmed the diagnosis and did and MRI of the brain that was normal.  He had botox in to the vocal cords and that was helpful.  He would lose the voice for 6 weeks after the injections and then would get the voice back for 6 weeks.  He would go back to [place] twice per year for the injections.  He hasn't had the injections for the last 2 years as he quit working.  The spasmodic dysphonia is worse on the phone.  Last ST was 1995.  Having some trouble with swallowing.  Had developed some movements in the face and tried botox there but didn't really help there.  Has had some neck extension as well with the dystonic movements.  Wife states that these sx's have been better since retirement; he is under less stress.  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  The patient reports that [name] tremor started in the thumb in may and has slowly gotten worse and 3 weeks ago, he noted just a little in the [name].  He notes the tremor most at rest.  09/26/14 update:  The patient returns today for follow-up.  The patient has a history of Meige's syndrome, but presented last time with parkinsonism, but did not meet all the criteria for idiopathic Parkinson's disease.  Nonetheless, the patient wanted to start on medication.  We started him on Mirapex ER and he has worked his way up to 1.5 mg daily.  The patient reports that he is about 50% better.  He states that it has helped tremor but it isn't gone.  His wife thinks that mirapex is disrupting his sleep.   He has constipation and thought that it  was the mirapex.  His balance has been good.   He had an MRI of the brain since last visit and that was unremarkable.  Because of significant dysphagia from his Meige syndrome he had a modified barium swallow on 08/07/2014 and that revealed mild oral dysphagia, moderate pharyngeal phase dysphagia and a regular diet with thin liquids was recommended.   Has a strange sensation under the bottom of the foot like something is under the ball of the foot.  Has had paresthesias off and on for a long time.  Did get chemo with nonHodgkins and been on B6 since.  01/02/15 update:  The patient is following up today, accompanied by his wife is supplements the history.  The patient is on Mirapex ER, 1.5 mg daily.  Overall, the patient states that he is doing well.  No falls.  No hallucinations.  He continues to have issues with dysphagia, but that is primarily from his long history of Meige syndrome.  He continues to not need much sleep at night but it is not transitioning into daytime sleepiness.    05/03/15 update:  The patient returns today for follow-up, accompanied by his wife who supplements the history.  The patient is on pramipexole ER, 1.5 mg daily. He is doing well on it. He states that  he was exercising but he "fell off the wagon."   He was referred last visit for physical and speech therapy. He states that he never got a call in that regard.   He does have a history of Meige syndrome, which is the primarily etiology for the dysphagia and some of the speech issues.  Last visit, I did ask him to add B12 supplementation, as his B12 was on the low end of normal.  He has been taking his B12 supplement faithfully.  He already he has peripheral neuropathy because of his history of chemotherapy.   Pt states that he is sleeping better than last visit.    09/03/15 update:  The patient follows up today, accompanied by his wife who supplements the history.  He remains on pramipexole ER, 1.5 mg daily.  He has completed physical  therapy and remains in speech therapy.  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  He is doing his PT at home.  Hasn't been faithful with CV exercise.  Has just started melatonin for insomnia and thinks it is helping.   Overall, the patient has been stable.  He has not experienced any falls.  No hallucinations.  No lightheadedness or near syncope.  He has not choked on any of his foods.  His wife is noting some short term memory issues - he forgot to shut garage door and one time forgot to take mirapex.  Pt noted that he had bad RLS when he did that.    01/08/16 update:  The patient follows up today, accompanied by his wife who supplements the history.  He is on Mirapex ER, 1.5 g daily.  He denies compulsive behaviors.  No falls since last visit.  He does have tremor, but not bothersome enough to change his medication.  No hallucinations.  No lightheadedness or near syncope.  He did have a modified barium swallow on 10/18/2015.  There is mild oral and moderate pharyngeal dysphagia.  This was worse with solids than liquids.  The patient felt that this was actually better than his last study.  There is no dietary changes recommended interventions recommended that he eat slowly, take small bites and drink plenty of liquids after his small solid bites.  04/11/16 update:  The patient follows up today, accompanied by his wife who supplements the history.  He is on Mirapex ER, 1.5 g daily.  He denies compulsive behaviors.  No falls since last visit.  He does have tremor, but not bothersome enough to change his medication.  No hallucinations.  No lightheadedness or near syncope.  He did have his therapy screens and while physical therapy was not recommended, occupational and speech therapy were and he will be starting those soon.  Not exercising.     ALLERGIES:  No Known Allergies  CURRENT MEDICATIONS:  Outpatient Encounter Prescriptions as of 04/11/2016    Medication Sig  . aspirin 81 MG chewable tablet Chew 81 mg by mouth daily.  . Cholecalciferol (VITAMIN D3) 3000 UNITS TABS Take 1,000 Units by mouth.   . cyanocobalamin 100 MCG tablet Take 1,000 mcg by mouth daily.   . Melatonin 3 MG TABS Take by mouth at bedtime.  . Pramipexole Dihydrochloride 1.5 MG TB24 TAKE 1 TABLET DAILY   No facility-administered encounter medications on file as of 04/11/2016.    PAST MEDICAL HISTORY:   Past Medical History  Diagnosis Date  . Dystonia 1994    diagnosed in  [place]  . Non Hodgkin's lymphoma ([place]) 2007    diffuse- 6 cycles of chemo with R CHOP Rituxan - last dose 10/08  . Cervical lymphadenopathy     right - followed my ENT  . Parkinson disease ([place])     2015    PAST SURGICAL HISTORY:   Past Surgical History  Procedure Laterality Date  . Splenectomy    . Cholecystectomy, laparoscopic    . Port-a-cath removal    . Portacath placement    . Tonsillectomy    . Eye surgery      x2 as child    SOCIAL HISTORY:   Social History   Social History  . Marital Status: Married    Spouse Name: N/A  . Number of Children: N/A  . Years of Education: N/A   Occupational History  . Not on file.   Social History Main Topics  . Smoking status: Never Smoker   . Smokeless tobacco: Never Used  . Alcohol Use: No  . Drug Use: No  . Sexual Activity: Not on file   Other Topics Concern  . Not on file   Social History Narrative    FAMILY HISTORY:   Family Status  Relation Status Death Age  . Mother Deceased     heart disease, lung cancer  . Father Deceased     heart attack  . Brother Deceased     colon cancer  . Sister Alive     unknown  . Son Alive     anemia  . Son Alive     healthy  . Daughter Alive     healthy  . Son Deceased     debrancher's enzyme disease  . Son Deceased     debrancher's enzyme disease    ROS:  A complete 10 system review of systems was obtained and was unremarkable apart from what is mentioned  above.  PHYSICAL EXAMINATION:    VITALS:   Filed Vitals:   04/11/16 1008  BP: 102/64  Pulse: 70  Height: 5\' 6"  (1.676 m)  Weight: 182 lb (82.555 kg)    GEN:  The patient appears stated age and is in NAD. HEENT:  Normocephalic, atraumatic.  The mucous membranes are moist. The superficial temporal arteries are without ropiness or tenderness. CV:  RRR Lungs:  CTAB Neck/HEME:  There are no carotid bruits bilaterally.  Neurological examination:  Orientation: The patient is alert and oriented x3.  Cranial nerves: There is good facial symmetry.  Slight facial hypomimia.  Mild oromandibular dystonia. Pupils are equal round and reactive to light bilaterally. Fundoscopic exam reveals clear margins bilaterally. There is L exotropia (chronic). The visual fields are full to confrontational testing. The speech bulbar in quality and signifcant spasmodic dysphonia.   Soft palate rises symmetrically and there is no tongue deviation. Hearing is intact to conversational tone. Sensation: Sensation is intact to light touch throughout. Motor: Strength is 5/5 in the bilateral upper and lower extremities.   Shoulder shrug is equal and symmetric.  There is no pronator drift.   Movement examination: Tone: There is normal tone in the bilateral upper extremities.  The tone in the lower extremities is normal.  Abnormal movements: There is mild intermittent resting tremor of the [name] Coordination:  There is some decremation with finger taps on the L. Gait and Station: The patient has no difficulty arising out of a deep-seated chair without the use of the hands. The patient's stride length is normal.  The patient has a  negative pull test.     Lab Results  Component Value Date   VITAMINB12 292 09/26/2014   .   Chemistry      Component Value Date/Time   NA 142 03/27/2015 0824   K 4.1 03/27/2015 0824   CL 105 03/27/2015 0824   CO2 26 03/27/2015 0824   BUN 13 03/27/2015 0824   CREATININE 1.02 03/27/2015  0824   CREATININE 0.94 09/26/2014 0925      Component Value Date/Time   CALCIUM 9.2 03/27/2015 0824   ALKPHOS 52 03/27/2015 0824   ALKPHOS 52 03/27/2015 0824   AST 23 03/27/2015 0824   AST 23 03/27/2015 0824   ALT 37 03/27/2015 0824   ALT 37 03/27/2015 0824   BILITOT 0.7 03/27/2015 0824   BILITOT 0.7 03/27/2015 0824     Lab Results  Component Value Date   WBC 11.6* 03/27/2015   HGB 15.9 03/27/2015   HCT 47.8 03/27/2015   MCV 97.7 03/27/2015   PLT 284.0 03/27/2015       ASSESSMENT/PLAN:  1.  Mild PD  -Even though he has prominent facial dystonia (meige syndrome), I don't think that this is related as this started in 1994.  He has found Mirapex to be helpful.   He does think that it has helped tremor but not fully but we both decided it would not be worth increasing the dosage. He has no compulsive side effects with the Mirapex.   -Talked about the importance of resuming cardiovascular exercise as he is not consistent with that.  Greater than 50% of the 25 minute visit in counseling in this regard.  -Talked to him about exercise.  He is not doing this.  Talked to him about doing the biking program in [place], which we discussed last visit.  Info given on rock steady boxing.  -starting OT/ST soon 2.  Dysphagia  -primarily related to Meige syndrome.  -He did have a modified barium swallow on 10/18/2015.  There is mild oral and moderate pharyngeal dysphagia.  This was worse with solids than liquids.  3.  Paresthesias.  -Probable due to peripheral neuropathy, which is likely due to his history of chemotherapy from non-Hodgkin's lymphoma.    -His B12 has been on the low end, but he has been faithfully taking a supplement.   4.  Constipation  -He has a copy of rancho recipe 5.  Follow-up in the next few months (3-4 months), sooner should new neurologic issues arise.  Much greater than 50% of this visit was spent in counseling re: importance of CV exercise.  Total face to face time:   25 min

## 2016-04-29 ENCOUNTER — Ambulatory Visit: Payer: BLUE CROSS/BLUE SHIELD | Attending: Internal Medicine | Admitting: Occupational Therapy

## 2016-04-29 DIAGNOSIS — R2689 Other abnormalities of gait and mobility: Secondary | ICD-10-CM

## 2016-04-29 DIAGNOSIS — R29818 Other symptoms and signs involving the nervous system: Secondary | ICD-10-CM

## 2016-04-29 DIAGNOSIS — R278 Other lack of coordination: Secondary | ICD-10-CM | POA: Diagnosis present

## 2016-04-29 DIAGNOSIS — R471 Dysarthria and anarthria: Secondary | ICD-10-CM | POA: Diagnosis present

## 2016-04-29 DIAGNOSIS — R251 Tremor, unspecified: Secondary | ICD-10-CM

## 2016-04-29 DIAGNOSIS — R293 Abnormal posture: Secondary | ICD-10-CM

## 2016-04-29 NOTE — Therapy (Signed)
[facility] [address] [place], [place], [address] Phone: [phone]   Fax:  [phone]  Occupational Therapy Treatment  Patient Details  Name: [name] [name] MRN: [id number] Date of Birth: [birth year] Referring Provider: Dr. Carles Collet  Encounter Date: 04/29/2016      OT End of Session - 04/29/16 1143    Visit Number 1   Number of Visits 17   Date for OT Re-Evaluation 06/27/16   Authorization Type MCR   Authorization - Visit Number 1   Authorization - Number of Visits 10   OT Start Time 1020   OT Stop Time 1100   OT Time Calculation (min) 40 min   Activity Tolerance Patient tolerated treatment well   Behavior During Therapy [facility] for tasks assessed/performed      Past Medical History:  Diagnosis Date  . Cervical lymphadenopathy    right - followed my ENT  . Dystonia 1994   diagnosed in [place]  . Non Hodgkin's lymphoma ([place]) 2007   diffuse- 6 cycles of chemo with R CHOP Rituxan - last dose 10/08  . Parkinson disease ([place])    2015    Past Surgical History:  Procedure Laterality Date  . CHOLECYSTECTOMY, LAPAROSCOPIC    . EYE SURGERY     x2 as child  . PORT-A-CATH REMOVAL    . PORTACATH PLACEMENT    . SPLENECTOMY    . TONSILLECTOMY      There were no vitals filed for this visit.      Subjective Assessment - 04/29/16 1022    Subjective  Pt was diagnosed with Parkinsonism approximately 2 1/2 years ago   Patient Stated Goals To be able to write better   Currently in Pain? No/denies            [facility] OT Assessment - 04/29/16 0001      Assessment   Diagnosis Parkinsonism  diagnosed: dystonia in 1994, Parkinsonism 2.5 yrs ago   Referring Provider Dr. Carles Collet   Onset Date 03/04/16   Prior Therapy PT, ST     Precautions   Precautions Fall     Balance Screen   Has the patient fallen in the past 6 months No   Has the patient had a decrease in activity level because of a fear of falling?  No   Is  the patient reluctant to leave their home because of a fear of falling?  No     Home  Environment   Family/patient expects to be discharged to: Private residence   Home Access Ramped entrance   [place]  there is chair lift from first to second floor   Bathroom Engineer, structural in shower   Lives With Spouse  wife has MS     Prior Function   Level of Independence Independent   Vocation Retired   Biomedical scientist --  retired Programmer, applications and restoring Dietitian  sautering     ADL   Eating/Feeding Modified independent   [place] independent   Chartered certified accountant Dressing Needs assist for fasteners;Increased time   Lower Body Dressing Independent;Increased time   Tax adviser Independent   ADL comments Pt reports he requires increased time for ADLs     IADL   Shopping Shops independently for small purchases   Meal Prep --  Never cooked  Medication Management Is responsible for taking medication in correct dosages at correct time   Financial Management --  Pt's wife handles finances     Written Expression   Dominant Hand Right   Handwriting 25% legible;Severe micrographia     Vision - History   Baseline Vision Wears glasses all the time   Visual History --  denies visual changes     Cognition   Overall Cognitive Status Cognition to be further assessed in functional context PRN  denies cognitive changes     Observation/Other Assessments   Simulated Eating Time (seconds) 18.16 secs   Donning Doffing Jacket Time (seconds) 8.22 secs   Donning Doffing Jacket Comments 3 button/ unbutton:44.75 secs     Sensation   Light Touch Appears Intact     Coordination   Gross Motor Movements are Fluid and Coordinated No   Fine Motor Movements are Fluid and Coordinated No   9 Hole Peg Test Right;Left    Right 9 Hole Peg Test 24.47 secs   Left 9 Hole Peg Test 26.57 secs   Box and Blocks [name] 52 blocks, LUE 58 blocks   Tremors tremors bilaterally, right more severe than left      ROM / Strength   AROM / PROM / Strength AROM     AROM   Overall AROM  Within functional limits for tasks performed                            OT Short Term Goals - 04/29/16 1143      OT SHORT TERM GOAL #1   Title I with PD specific HEP.   Time 4   Period Weeks   Status New     OT SHORT TERM GOAL #2   Title Pt will verbalize understanding of adapted strategies to maximize safety and independence with ADLs/ IADLs.   Time 4   Period Weeks   Status New     OT SHORT TERM GOAL #3   Title Pt will demonstrate increased [name] functional use as evidenced by increasing box/ blocks score by 4 blocks.   Time 4   Period Weeks   Status New     OT SHORT TERM GOAL #4   Title Pt will demonstrate ability to write a sentence with 95% legibility and only minimal decrease in letter size.   Time 4   Period Weeks   Status New     OT SHORT TERM GOAL #5   Title Pt will verbalize understanding of strategies to keep thinking skills sharp.   Time 4   Period Weeks   Status New           OT Long Term Goals - 04/29/16 1145      OT LONG TERM GOAL #1   Title Pt will demonstrate increased ease with dressing as evidenced by decreasing 3 button/ unbutton to 35 secs or less.   Baseline 44.75 secs   Time 8   Period Weeks   Status New     OT LONG TERM GOAL #2   Title Pt will demonstrate increased ease with feeding as evidenced by decreasing PPT#2 to 15 secs or less.   Baseline 18.16 secs   Time 8   Period Weeks   Status New     OT LONG TERM GOAL #3   Title Pt will verbalize understanding of ways to prevent future PD-related complications and will verbalize understanding of community resources.  Time 8   Period Weeks   Status New               Plan - 04/29/16 1136    Clinical  Impression Statement 65 y.o with PMH significant for non-hodgkins lymphoma, dystonia, cervical lymphadenopathy, presents to occupational therapy with Parkinsonism. Pt was diagnosed approximately 2.5 yrs ago and he has not received OT in the past. Pt presents with tremor, decreased coordination, bradykensia, abnormal posture, and decreased balance which impedes performance of ADLs/ IADLS. Pt can benefit from skilled occupational therapy to maximize safety and indpendence with ADLs/ IADLs and maintain quality of life.    Rehab Potential Good   OT Frequency 2x / week  plus eval   OT Duration 8 weeks   OT Treatment/Interventions Self-care/ADL training;Moist Heat;Fluidtherapy;DME and/or AE instruction;Patient/family education;Balance training;Therapeutic exercises;Ultrasound;Therapeutic exercise;Therapeutic activities;Cognitive remediation/compensation;Passive range of motion;Neuromuscular education;Cryotherapy;Parrafin;Energy conservation;Manual Therapy   Plan initiate PD specific HEP(PWR!)   Consulted and Agree with Plan of Care Patient      Patient will benefit from skilled therapeutic intervention in order to improve the following deficits and impairments:  Abnormal gait, Decreased coordination, Impaired flexibility, Impaired UE functional use, Decreased knowledge of use of DME, Decreased balance, Decreased mobility, Decreased strength  Visit Diagnosis: Other lack of coordination - Plan: Ot plan of care cert/re-cert  Tremor, unspecified - Plan: Ot plan of care cert/re-cert  Other symptoms and signs involving the nervous system - Plan: Ot plan of care cert/re-cert  Abnormal posture - Plan: Ot plan of care cert/re-cert  Other abnormalities of gait and mobility - Plan: Ot plan of care cert/re-cert      G-Codes - [id number]    Functional Assessment Tool Used 3 button / unbutton: 44.75 secs, PPT# 2 : 18.16 secs, severe micrographia, with a sentence only 25% legibile   Functional Limitation  Self care   Self Care Current Status [id number]) At least 20 percent but less than 40 percent impaired, limited or restricted   Self Care Goal Status [id number]) At least 1 percent but less than 20 percent impaired, limited or restricted      Problem List Patient Active Problem List   Diagnosis Date Noted  . Preventative health care 04/02/2015  . Constipation 04/02/2015  . 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 Dysphagia, neurologic 07/27/2014  . Paralysis agitans ([place]) 07/27/2014  . Dystonia 07/10/2014  . Low back pain 11/21/2011  . TRANSAMINASES, SERUM, ELEVATED 07/06/2008  . DIARRHEA 05/23/2008  . LYMPH NODE-ENLARGED 07/28/2007  . Non-Hodgkin lymphoma ([place]) 03/23/2007    RINE,KATHRYN 04/29/2016, 12:59 PM  [facility], OTR/L [phone] Phone: [phone] 12:59 PM 08/08/17[facility] [address] [place], [place], [address] Phone: [phone]   Fax:  [phone]  Name: GAYLIN HASMAN MRN: [id number] Date of Birth: Jan 29, 1951

## 2016-05-01 ENCOUNTER — Telehealth: Payer: Self-pay | Admitting: Internal Medicine

## 2016-05-01 NOTE — Telephone Encounter (Signed)
Pt was a pt of Dr Lora Havens and lives closer to your practice.   We do not anticipate Dr Shawna Orleans returning, and pt would like to know if you would accept him as a pt?  Pt has parkinson's, sees a neurologist, so prefers a doctor.  Do you mind seeing this pt?

## 2016-05-02 NOTE — Telephone Encounter (Signed)
Unfortunately I can't accept new patients but others in the practice are available.

## 2016-05-06 ENCOUNTER — Ambulatory Visit: Payer: BLUE CROSS/BLUE SHIELD | Admitting: Speech Pathology

## 2016-05-06 ENCOUNTER — Ambulatory Visit: Payer: BLUE CROSS/BLUE SHIELD | Admitting: Occupational Therapy

## 2016-05-06 DIAGNOSIS — R278 Other lack of coordination: Secondary | ICD-10-CM

## 2016-05-06 DIAGNOSIS — R251 Tremor, unspecified: Secondary | ICD-10-CM

## 2016-05-06 DIAGNOSIS — R2689 Other abnormalities of gait and mobility: Secondary | ICD-10-CM

## 2016-05-06 DIAGNOSIS — R29818 Other symptoms and signs involving the nervous system: Secondary | ICD-10-CM

## 2016-05-06 DIAGNOSIS — R293 Abnormal posture: Secondary | ICD-10-CM

## 2016-05-06 DIAGNOSIS — R471 Dysarthria and anarthria: Secondary | ICD-10-CM

## 2016-05-06 NOTE — Patient Instructions (Addendum)
PWR! Hands  With arms stretched out in front of you (elbows straight), perform the following:  PWR! Rock: Move wrists up and down [employer]! Twist: Twist palms up and down BIG  Then, start with elbows bent and hands closed.  PWR! Step: Touch index finger to thumb while keeping other fingers straight. Flick fingers out BIG (thumb out/straighten fingers). Repeat with other fingers. (Step your thumb to each finger).  PWR! Hands: Push hands out BIG. Elbows straight, wrists up, fingers open and spread apart BIG. (Can also perform by pushing down on table, chair, knees. Push above head, out to the side, behind you, in front of you.)   ** Make each movement big and deliberate so that you feel the movement.  Perform at least 10 repetitions 1x/day, but perform PWR! hands throughout the day when you are having trouble using your hands (picking up/manipulating small objects, writing, eating, typing, sewing, buttoning, etc.).       . (Exercise) Monday Tuesday Wednesday Thursday Friday Saturday Sunday   PWR! sitting           PWR! Supine (lying on back)           PWR! standing           PWR! hands (everyday)

## 2016-05-06 NOTE — Patient Instructions (Signed)
  Loud AH!! 5x twice a day  Read aloud 5 minutes twice a day or recite passages you have memorized - feel like you are really projecting or shouting.  Take big breaths at the periods or commas to support loud volume  If you use dB meter app - shoot for 77dB as is reads a little low  Remember you use a big breath to generate volume  Environmental Tips to help you be heard:  Get the person's attention before you began speaking  Be in close proximity to the person you are talking to  Talk face to face with good eye contact  Be aware of how loud the room is - mute the TV, walk away from the dishwasher, washing machine, running, fans, sink etc.

## 2016-05-06 NOTE — Therapy (Signed)
[name] [name] [address] [place], [place], [address] Phone: [phone]   Fax:  [phone]  Speech Language Pathology Evaluation  Patient Details  Name: [name] [name] MRN: [id number] Date of Birth: 1951-07-16 Referring Provider: Dr. Wells Guiles [name]  Encounter Date: 05/06/2016      End of Session - 05/06/16 1511    Visit Number 1   Number of Visits 17   Date for SLP Re-Evaluation 07/01/16   SLP Start Time 1103   SLP Stop Time  1141   SLP Time Calculation (min) 38 min      Past Medical History:  Diagnosis Date  . Cervical lymphadenopathy    right - followed my ENT  . Dystonia 1994   diagnosed in [place]  . Non Hodgkin's lymphoma ([place]) 2007   diffuse- 6 cycles of chemo with R CHOP Rituxan - last dose 10/08  . Parkinson disease ([place])    2015    Past Surgical History:  Procedure Laterality Date  . CHOLECYSTECTOMY, LAPAROSCOPIC    . EYE SURGERY     x2 as child  . PORT-A-CATH REMOVAL    . PORTACATH PLACEMENT    . SPLENECTOMY    . TONSILLECTOMY      There were no vitals filed for this visit.      Subjective Assessment - 05/06/16 1114    Subjective "Everybody asks me to repeat myself"   Currently in Pain? No/denies            SLP Evaluation [facility] - 05/06/16 1114      SLP Visit Information   SLP Received On 05/06/16   Referring Provider Dr. Wells Guiles [name]   Onset Date PD dx 2015   Medical Diagnosis Parkinson's Disease     Subjective   Patient/Family Stated Goal Doesn't have a goal     General Information   Mobility Status walks independently     Prior Functional Status   Cognitive/Linguistic Baseline Baseline deficits   Type of Home House    Lives With Spouse   Available Support Family   Vocation Retired     Scientist, physiological Comprehension   Overall Auditory Comprehension Appears within functional limits for tasks assessed     Verbal Expression   Overall Verbal Expression Appears within  functional limits for tasks assessed     Oral Motor/Sensory Function   Overall Oral Motor/Sensory Function Impaired   Labial Coordination Other (comment)  tremor   Lingual Coordination --  tremor     Motor Speech   Overall Motor Speech Other (comment)   Respiration Within functional limits   Phonation Low vocal intensity   Resonance Hypernasality   Effective Techniques Increased vocal intensity                      ADULT SLP TREATMENT - 05/06/16 1509      General Information   Behavior/Cognition Alert;Cooperative;Pleasant mood     Treatment Provided   Treatment provided Cognitive-Linquistic     Pain Assessment   Pain Assessment No/denies pain     Cognitive-Linquistic Treatment   Treatment focused on Dysarthria           SLP Education - 05/06/16 1510    Education provided Yes   Education Details goals for ST, loud AH! environmental compensations for dysarthria at home   Person(s) Educated Patient   Methods Explanation;Demonstration;Handout   Comprehension Verbalized understanding;Returned demonstration;Need further instruction          SLP Short  Term Goals - 2016-05-17 1516      SLP SHORT TERM GOAL #1   Title Pt will achieve 90dB average on loud /a/ over 3 sessions   Time 4   Period Weeks   Status New     SLP SHORT TERM GOAL #2   Title Pt will mainitain average of 70dB during structured speech tasks and sentence level utteraces with occasional minc cues.   Time 4   Period Weeks   Status New     SLP SHORT TERM GOAL #3   Title Pt will follow HEP for dysphagia with occasional min A as tolerated   Time 4   Period Weeks   Status New          SLP Long Term Goals - 05-17-2016 1517      SLP LONG TERM GOAL #1   Title Pt will maintain average of 70dB over 12 minute simple conversation with occasional min A   Time 8   Period Weeks   Status New     SLP LONG TERM GOAL #2   Title Pt will be audible over 15 minute conversation in noisy  environment/outside of therpay room with min rare A x 2 sessions   Time 8   Period Weeks   Status New          Plan - 05/17/2016 1511    Clinical Impression Statement [name] is a 65 y.o. male known to use from prior course of ST due to Parkinson's induced dysarthria. He presents today with reduced volume and complaints of not being heard at home causing his wife frustration as well as not being heard out in the community.  10 minutes of conversational speech was reduced today, at average 67dB (WNL= average 70-72dB) with range of 66 to 69dB, when a sound level meter was placed 30 cm away from pt's mouth. Overall intelligibility for this listener in a quiet environment was approx %.95 Production of loud /a/ averaged 90dB (range of 88 to 92) and min cues occasionally needed for loudness.   Oral motor assessment revealed WFL lingual ROM and WFL lingual strength. Labial ROM was [facility] and strength was [facility]. Lingual and labial tremors noted. Velar ROM appeared [facility], however pt slightly hypernasal speech.   Pt rated effort level at 7/10 for production of loud /a/ (10=maximal effort). In reading tasks, pt was asked to use the same amount of effort as with loud /a/. Loudness average with this increased effort was 70dB (range of 68 to 72) with min A occasionally for loudness. Pt would benefit from skilled ST in order to improve speech intelligibility and pt's QOL.  Pt did not report dysphagia when asked.     Speech Therapy Frequency 2x / week   Duration --  8 weeks   Treatment/Interventions Functional tasks;Compensatory strategies;Multimodal communcation approach;SLP instruction and feedback;Patient/family education;Environmental controls      Patient will benefit from skilled therapeutic intervention in order to improve the following deficits and impairments:   Dysarthria and anarthria      G-Codes - 05-17-2016 1519    Functional Assessment Tool Used noms   Functional Limitations Motor speech    Motor Speech Current Status [phone]) At least 20 percent but less than 40 percent impaired, limited or restricted   Motor Speech Goal Status [id number]) At least 1 percent but less than 20 percent impaired, limited or restricted      Problem List Patient Active Problem List   Diagnosis Date Noted  . Preventative  health care 04/02/2015  . Constipation 04/02/2015  . 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 Dysphagia, neurologic 07/27/2014  . Paralysis agitans ([place]) 07/27/2014  . Dystonia 07/10/2014  . Low back pain 11/21/2011  . TRANSAMINASES, SERUM, ELEVATED 07/06/2008  . DIARRHEA 05/23/2008  . LYMPH NODE-ENLARGED 07/28/2007  . Non-Hodgkin lymphoma ([place]) 03/23/2007    Jep Dyas, [name] MS, CCC-SLP 05/06/2016, 3:21 PM  [facility] [address] [place], [place], [address] Phone: [phone]   Fax:  [phone]  Name: [name] [name] MRN: [id number] Date of Birth: Feb 21, 1951

## 2016-05-06 NOTE — Therapy (Addendum)
[facility] [address] [place], [place], [address] Phone: [phone]   Fax:  [phone]  Occupational Therapy Treatment  Patient Details  Name: [name] [name] MRN: [id number] Date of Birth: [birth year] Referring Provider: Dr. Carles Collet  Encounter Date: 05/06/2016      OT End of Session - 05/06/16 1316    Visit Number 2   Number of Visits 17   Date for OT Re-Evaluation 06/27/16   Authorization Type MCR   Authorization - Visit Number 2   Authorization - Number of Visits 10   OT Start Time [id number]   OT Stop Time 1237   OT Time Calculation (min) 49 min   Activity Tolerance Patient tolerated treatment well   Behavior During Therapy [facility] for tasks assessed/performed      Past Medical History:  Diagnosis Date  . Cervical lymphadenopathy    right - followed my ENT  . Dystonia 1994   diagnosed in [place]  . Non Hodgkin's lymphoma ([place]) 2007   diffuse- 6 cycles of chemo with R CHOP Rituxan - last dose 10/08  . Parkinson disease ([place])    2015    Past Surgical History:  Procedure Laterality Date  . CHOLECYSTECTOMY, LAPAROSCOPIC    . EYE SURGERY     x2 as child  . PORT-A-CATH REMOVAL    . PORTACATH PLACEMENT    . SPLENECTOMY    . TONSILLECTOMY      There were no vitals filed for this visit.       Neuro re-ed:  PWR! Moves (basic 4) in sitting (previously issued by PT) x 10-20 each with min cues For incr movement amplitude.  Recommended use of PD ex flowsheet/chart to perform 2 positions of PWR! Moves per day +PWR! Hands everyday.  Pt verbalized understanding.   Began education regarding use of large amplitude movement strategies/HEP for prevention of future complication (importance of trunk/associated movements for reaching to decr risk of shoulder pain/injury) and began education of incorporation of larger amplitude movements into ADLs (reaching to get seatbelt, use of PWR! Hands in prep for  writing/buttoning/working on electronics, donning pants).  Also discussed decr awareness of movement amplitude with PD for automatic movements.  Pt instructed in use of using active movements/PWR! Hands to calm tremors briefly for fine motor tasks (pt has been doing this some).  Pt verbalized understanding.                          OT Education - 05/06/16 1314    Education provided Yes   Education Details PWR! moves in supine (basic 4), PWR! hands (basic 4); PD exercise flowsheet    Person(s) Educated Patient   Methods Explanation;Handout;Verbal cues   Comprehension Verbalized understanding;Returned demonstration;Verbal cues required          OT Short Term Goals - 04/29/16 1143      OT SHORT TERM GOAL #1   Title I with PD specific HEP.   Time 4   Period Weeks   Status New     OT SHORT TERM GOAL #2   Title Pt will verbalize understanding of adapted strategies to maximize safety and independence with ADLs/ IADLs.   Time 4   Period Weeks   Status New     OT SHORT TERM GOAL #3   Title Pt will demonstrate increased [name] functional use as evidenced by increasing box/ blocks score by 4 blocks.   Time 4   Period  Weeks   Status New     OT SHORT TERM GOAL #4   Title Pt will demonstrate ability to write a sentence with 95% legibility and only minimal decrease in letter size.   Time 4   Period Weeks   Status New     OT SHORT TERM GOAL #5   Title Pt will verbalize understanding of strategies to keep thinking skills sharp.   Time 4   Period Weeks   Status New           OT Long Term Goals - 04/29/16 1145      OT LONG TERM GOAL #1   Title Pt will demonstrate increased ease with dressing as evidenced by decreasing 3 button/ unbutton to 35 secs or less.   Baseline 44.75 secs   Time 8   Period Weeks   Status New     OT LONG TERM GOAL #2   Title Pt will demonstrate increased ease with feeding as evidenced by decreasing PPT#2 to 15 secs or less.   Baseline  18.16 secs   Time 8   Period Weeks   Status New     OT LONG TERM GOAL #3   Title Pt will verbalize understanding of ways to prevent future PD-related complications and will verbalize understanding of community resources.   Time 8   Period Weeks   Status New               Plan - 05/06/16 1318    Clinical Impression Statement Pt responds well for cueing for large amplitude movements during PWR! moves today.   Rehab Potential Good   OT Frequency 2x / week  plus eval   OT Duration 8 weeks   OT Treatment/Interventions Self-care/ADL training;Moist Heat;Fluidtherapy;DME and/or AE instruction;Patient/family education;Balance training;Therapeutic exercises;Ultrasound;Therapeutic exercise;Therapeutic activities;Cognitive remediation/compensation;Passive range of motion;Neuromuscular education;Cryotherapy;Parrafin;Energy conservation;Manual Therapy   Plan review PWR! supine, PWR! hands, initiate coordination HEP if time   Consulted and Agree with Plan of Care Patient      Patient will benefit from skilled therapeutic intervention in order to improve the following deficits and impairments:  Abnormal gait, Decreased coordination, Impaired flexibility, Impaired UE functional use, Decreased knowledge of use of DME, Decreased balance, Decreased mobility, Decreased strength  Visit Diagnosis: Other symptoms and signs involving the nervous system  Abnormal posture  Tremor, unspecified  Other lack of coordination  Other abnormalities of gait and mobility    Problem List Patient Active Problem List   Diagnosis Date Noted  . Preventative health care 04/02/2015  . Constipation 04/02/2015  . 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 Dysphagia, neurologic 07/27/2014  . Paralysis agitans ([place]) 07/27/2014  . Dystonia 07/10/2014  . Low back pain 11/21/2011  . TRANSAMINASES, SERUM, ELEVATED 07/06/2008  . DIARRHEA 05/23/2008   . LYMPH NODE-ENLARGED 07/28/2007  . Non-Hodgkin lymphoma ([place]) 03/23/2007    [facility] 05/06/2016, 1:21 PM  [facility] [address] [place], [place], [address] Phone: [phone]   Fax:  [phone]  Name: OLUWAFERANMI SCHULENBURG MRN: [id number] Date of Birth: 09/08/1951   Vianne Bulls, OTR/L [facility] [address] [place], [place]  [address] [phone] phone [phone] 05/06/16 1:22 PM

## 2016-05-16 ENCOUNTER — Ambulatory Visit: Payer: BLUE CROSS/BLUE SHIELD | Admitting: Occupational Therapy

## 2016-05-16 ENCOUNTER — Ambulatory Visit: Payer: BLUE CROSS/BLUE SHIELD | Admitting: Speech Pathology

## 2016-05-16 DIAGNOSIS — R29818 Other symptoms and signs involving the nervous system: Secondary | ICD-10-CM

## 2016-05-16 DIAGNOSIS — R278 Other lack of coordination: Secondary | ICD-10-CM

## 2016-05-16 DIAGNOSIS — R251 Tremor, unspecified: Secondary | ICD-10-CM

## 2016-05-16 DIAGNOSIS — R293 Abnormal posture: Secondary | ICD-10-CM

## 2016-05-16 DIAGNOSIS — R471 Dysarthria and anarthria: Secondary | ICD-10-CM

## 2016-05-16 NOTE — Therapy (Signed)
[facility] [address] [place], [place], [address] Phone: [phone]   Fax:  [phone]  Occupational Therapy Treatment  Patient Details  Name: [name] [name] MRN: [id number] Date of Birth: September 01, 1951 Referring Provider: Dr. Carles Collet  Encounter Date: 05/16/2016      OT End of Session - 05/16/16 0941    Visit Number 3   Number of Visits 17   Date for OT Re-Evaluation 06/27/16   Authorization Type MCR   Authorization - Visit Number 3   Authorization - Number of Visits 10   OT Start Time [id number]   OT Stop Time 1015   OT Time Calculation (min) 40 min   Activity Tolerance Patient tolerated treatment well   Behavior During Therapy [facility] for tasks assessed/performed      Past Medical History:  Diagnosis Date  . Cervical lymphadenopathy    right - followed my ENT  . Dystonia 1994   diagnosed in [place]  . Non Hodgkin's lymphoma ([place]) 2007   diffuse- 6 cycles of chemo with R CHOP Rituxan - last dose 10/08  . Parkinson disease ([place])    2015    Past Surgical History:  Procedure Laterality Date  . CHOLECYSTECTOMY, LAPAROSCOPIC    . EYE SURGERY     x2 as child  . PORT-A-CATH REMOVAL    . PORTACATH PLACEMENT    . SPLENECTOMY    . TONSILLECTOMY      There were no vitals filed for this visit.                       PWR [facility]) - 05/16/16 1022    PWR! Up 10   PWR! Rock 20   PWR! Twist 20   PWR! Step 10   Comments min v.c. for performance/. larger amplitude movements             OT Education - 05/16/16 1023    Education provided Yes   Education Details PWR! hands basic 4, coordination HEP, supine PWR! basic 4   Person(s) Educated Patient   Methods Explanation;Demonstration;Verbal cues;Handout   Comprehension Verbalized understanding;Returned demonstration;Verbal cues required          OT Short Term Goals - 04/29/16 1143      OT SHORT TERM GOAL #1   Title I with PD specific HEP.    Time 4   Period Weeks   Status New     OT SHORT TERM GOAL #2   Title Pt will verbalize understanding of adapted strategies to maximize safety and independence with ADLs/ IADLs.   Time 4   Period Weeks   Status New     OT SHORT TERM GOAL #3   Title Pt will demonstrate increased [name] functional use as evidenced by increasing box/ blocks score by 4 blocks.   Time 4   Period Weeks   Status New     OT SHORT TERM GOAL #4   Title Pt will demonstrate ability to write a sentence with 95% legibility and only minimal decrease in letter size.   Time 4   Period Weeks   Status New     OT SHORT TERM GOAL #5   Title Pt will verbalize understanding of strategies to keep thinking skills sharp.   Time 4   Period Weeks   Status New           OT Long Term Goals - 04/29/16 1145      OT LONG TERM  GOAL #1   Title Pt will demonstrate increased ease with dressing as evidenced by decreasing 3 button/ unbutton to 35 secs or less.   Baseline 44.75 secs   Time 8   Period Weeks   Status New     OT LONG TERM GOAL #2   Title Pt will demonstrate increased ease with feeding as evidenced by decreasing PPT#2 to 15 secs or less.   Baseline 18.16 secs   Time 8   Period Weeks   Status New     OT LONG TERM GOAL #3   Title Pt will verbalize understanding of ways to prevent future PD-related complications and will verbalize understanding of community resources.   Time 8   Period Weeks   Status New               Plan - 05/16/16 1009    Clinical Impression Statement Pt is progressing well towards goals. He demonstrates good understanding of PWR! moves in supine.   Rehab Potential Good   OT Frequency 2x / week   OT Duration 8 weeks   Plan progress HEP, consider PWR! seated or quadraped   Consulted and Agree with Plan of Care Patient      Patient will benefit from skilled therapeutic intervention in order to improve the following deficits and impairments:  Abnormal gait, Decreased  coordination, Impaired flexibility, Impaired UE functional use, Decreased knowledge of use of DME, Decreased balance, Decreased mobility, Decreased strength  Visit Diagnosis: Other symptoms and signs involving the nervous system  Abnormal posture  Tremor, unspecified  Other lack of coordination    Problem List Patient Active Problem List   Diagnosis Date Noted  . Preventative health care 04/02/2015  . Constipation 04/02/2015  . 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 Dysphagia, neurologic 07/27/2014  . Paralysis agitans ([place]) 07/27/2014  . Dystonia 07/10/2014  . Low back pain 11/21/2011  . TRANSAMINASES, SERUM, ELEVATED 07/06/2008  . DIARRHEA 05/23/2008  . LYMPH NODE-ENLARGED 07/28/2007  . Non-Hodgkin lymphoma ([place]) 03/23/2007    [name] [name] 05/16/2016, 10:24 AM Theone Murdoch, OTR/L [phone] Phone: [phone] 10:25 AM 05/16/16 [facility] [address] [place], [place], [address] Phone: [phone]   Fax:  [phone]  Name: [name] [name] MRN: [id number] Date of Birth: 1951/01/05

## 2016-05-16 NOTE — Therapy (Signed)
[facility] [address] [place], [place], [address] Phone: [phone]   Fax:  [phone]  Speech Language Pathology Treatment  Patient Details  Name: [name] [name] MRN: [id number] Date of Birth: [birth year] Referring Provider: Dr. Wells Guiles Tat  Encounter Date: 05/16/2016      End of Session - 05/16/16 1148    Visit Number 2   Number of Visits 17   Date for SLP Re-Evaluation 07/01/16   SLP Start Time 1101   SLP Stop Time  [id number]   SLP Time Calculation (min) 46 min      Past Medical History:  Diagnosis Date  . Cervical lymphadenopathy    right - followed my ENT  . Dystonia 1994   diagnosed in [place]  . Non Hodgkin's lymphoma ([place]) 2007   diffuse- 6 cycles of chemo with R CHOP Rituxan - last dose 10/08  . Parkinson disease ([place])    2015    Past Surgical History:  Procedure Laterality Date  . CHOLECYSTECTOMY, LAPAROSCOPIC    . EYE SURGERY     x2 as child  . PORT-A-CATH REMOVAL    . PORTACATH PLACEMENT    . SPLENECTOMY    . TONSILLECTOMY      There were no vitals filed for this visit.      Subjective Assessment - 05/16/16 1107    Subjective "I'm doing all my "ah's, that's for sure - and reading inconsistently"   Currently in Pain? No/denies               ADULT SLP TREATMENT - 05/16/16 1110      General Information   Behavior/Cognition Alert;Cooperative;Pleasant mood     Treatment Provided   Treatment provided Cognitive-Linquistic     Pain Assessment   Pain Assessment No/denies pain     Cognitive-Linquistic Treatment   Treatment focused on Dysarthria   Skilled Treatment Recalibrated loud volume with loud /a/ average of 93dB. Pt brought in app with dB measure- compared this to sound level meter, app read higher, educated pt  that if he is using his app, loud /a/ should  read at least 97dB and speech should read at  77-78dB.  Structured speech tasks sequencing ADL's  average  70dB -  and rare min A. Carryover of volume into conversation with mod A verbal, visual cues and cues for breath support.           SLP Education - 05/16/16 1145    Education provided Yes   Education Details How loud to be on dB app - breath support for speech.   Person(s) Educated Patient   Methods Explanation;Demonstration;Verbal cues;Handout   Comprehension Verbalized understanding;Returned demonstration;Verbal cues required          SLP Short Term Goals - 05/16/16 1147      SLP SHORT TERM GOAL #1   Title Pt will achieve 90dB average on loud /a/ over 3 sessions   Baseline 05/14/16   Time 4   Period Weeks   Status On-going     SLP SHORT TERM GOAL #2   Title Pt will mainitain average of 70dB during structured speech tasks and sentence level utteraces with occasional minc cues.   Time 4   Period Weeks   Status On-going     SLP SHORT TERM GOAL #3   Title Pt will follow HEP for dysphagia with occasional min A as tolerated   Time 4   Period Weeks   Status On-going  SLP Long Term Goals - 05/16/16 1148      SLP LONG TERM GOAL #1   Title Pt will maintain average of 70dB over 12 minute simple conversation with occasional min A   Time 8   Period Weeks   Status On-going     SLP LONG TERM GOAL #2   Title Pt will be audible over 15 minute conversation in noisy environment/outside of therpay room with min rare A x 2 sessions   Time 8   Period Weeks   Status On-going     SLP LONG TERM GOAL #3   Status On-going          Plan - 05/16/16 1146    Clinical Impression Statement Initiated training in breath support for volume and use of external feed back for volume (app). Pt required min A to maintain volume in structured tasks, mod A in simle short conversation. Continue skilled ST to maximize intellgibility.    Speech Therapy Frequency 2x / week   Duration --  8 weeks   Treatment/Interventions Functional tasks;Compensatory strategies;Multimodal communcation  approach;SLP instruction and feedback;Patient/family education;Environmental controls   Potential to Achieve Goals Good      Patient will benefit from skilled therapeutic intervention in order to improve the following deficits and impairments:   Dysarthria and anarthria    Problem List Patient Active Problem List   Diagnosis Date Noted  . Preventative health care 04/02/2015  . Constipation 04/02/2015  . 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 Dysphagia, neurologic 07/27/2014  . Paralysis agitans ([place]) 07/27/2014  . Dystonia 07/10/2014  . Low back pain 11/21/2011  . TRANSAMINASES, SERUM, ELEVATED 07/06/2008  . DIARRHEA 05/23/2008  . LYMPH NODE-ENLARGED 07/28/2007  . Non-Hodgkin lymphoma ([place]) 03/23/2007    [name], [name] 05/16/2016, 11:49 AM  [facility] [address] [place], [place], [address] Phone: [phone]   Fax:  [phone]   Name: [name] [name] MRN: [id number] Date of Birth: 04-22-1951

## 2016-05-16 NOTE — Patient Instructions (Addendum)
PWR! Hands  With arms stretched out in front of you (elbows straight), perform the following:  PWR! Rock: Move wrists up and down [employer]! Twist: Twist palms up and down BIG  Then, start with elbows bent and hands closed.  PWR! Step: Touch index finger to thumb while keeping other fingers straight. Flick fingers out BIG (thumb out/straighten fingers). Repeat with other fingers. (Step your thumb to each finger).  PWR! Hands: Push hands out BIG. Elbows straight, wrists up, fingers open and spread apart BIG. (Can also perform by pushing down on table, chair, knees. Push above head, out to the side, behind you, in front of you.)   ** Make each movement big and deliberate so that you feel the movement.  Perform at least 10 repetitions 1x/day, but perform PWR! hands throughout the day when you are having trouble using your hands (picking up/manipulating small objects, writing, eating, typing, sewing, buttoning, etc.)    .Coordination Exercises  Perform the following exercises for 20 minutes 1-2 times per day. Perform with both hand(s). Perform using big movements.  Flipping Cards: Place deck of cards on the table. Flip cards over by opening your hand big to grasp and then turn your palm up big. Deal cards: Hold 1/2 or whole deck in your hand. Use thumb to push card off top of deck with one big push. Rotate ball with fingertips: Pick up with fingers/thumb and move as much as you can with each turn/movement (clockwise and counter-clockwise). Toss ball from one hand to the other: Toss big/high. Toss ball in the air and catch with the same hand: Toss big/high. Rotate 2 golf balls in your hand: Both directions. Pick up coins and stack one at a time: Pick up with big, intentional movements. Do not drag coin to the edge. (5-10 in a stack) Pick up 5-10 coins one at a time and hold in palm. Then, move coins from palm to fingertips one at time and place in coin bank/container. Practice writing:  Slow down, write big, and focus on forming each letter. Practice typing. Perform "Flicks"/hand stretches (PWR! Hands): Close hands then flick out your fingers with focus on opening hands, pulling wrists back, and extending elbows like you are pushing.

## 2016-05-16 NOTE — Patient Instructions (Signed)
    When using your dB app - loud AH! Should be 97dB - speech should be 77dB at least  Breath more frequently - when reading big breath at each period/comma  If someone asks you to repeat yourself take a big breath.

## 2016-05-21 ENCOUNTER — Telehealth: Payer: Self-pay | Admitting: *Deleted

## 2016-05-21 NOTE — Telephone Encounter (Signed)
Unable to reach patient at time of Pre-Visit Call.  Left message for patient to return call when available.    

## 2016-05-22 ENCOUNTER — Ambulatory Visit (INDEPENDENT_AMBULATORY_CARE_PROVIDER_SITE_OTHER): Payer: BLUE CROSS/BLUE SHIELD | Admitting: Family Medicine

## 2016-05-22 ENCOUNTER — Encounter: Payer: Self-pay | Admitting: Family Medicine

## 2016-05-22 VITALS — BP 133/82 | HR 79 | Temp 98.3°F | Resp 17 | Ht 66.0 in | Wt 183.4 lb

## 2016-05-22 DIAGNOSIS — G20 Parkinson's disease: Secondary | ICD-10-CM

## 2016-05-22 NOTE — Progress Notes (Signed)
Chief Complaint  Patient presents with  . New Patient (Initial Visit)       New Patient Visit SUBJECTIVE: HPI: [name] [name] is an 65 y.o.male who is being seen for establishing care.  The patient was previously seen at [facility] in [place]. His old physician is leaving the practice and this office is closer to his home.  Health maintenance history: Physical: July 2016 Colonoscopy: 05/2015 Routine blood work: 2016- nml HIV/Hep C screening: Never had done, will think about it Tetanus: 2007, would not like a tetanus Shingles shot: Will check with insurance  Parkinson's Undergoing a physical rehab program. Stable on current meds. Follows with Neurology.  No Known Allergies  Past Medical History:  Diagnosis Date  . Cervical lymphadenopathy    right - followed my ENT  . Dystonia 1994   diagnosed in [place]  . Non Hodgkin's lymphoma ([place]) 2007   diffuse- 6 cycles of chemo with R CHOP Rituxan - last dose 10/08  . Parkinson disease ([place])    2015   Past Surgical History:  Procedure Laterality Date  . CHOLECYSTECTOMY, LAPAROSCOPIC    . EYE SURGERY     x2 as child  . PORT-A-CATH REMOVAL    . PORTACATH PLACEMENT    . SPLENECTOMY    . TONSILLECTOMY     Social History   Social History  . Marital status: Married   Social History Main Topics  . Smoking status: Never Smoker  . Smokeless tobacco: Never Used  . Alcohol use No  . Drug use: No   Family History  Problem Relation Age of Onset  . Heart disease Mother   . Cancer Mother     lung  . Heart disease Father     Current Outpatient Prescriptions:  .  aspirin 81 MG chewable tablet, Chew 81 mg by mouth daily., Disp: , Rfl:  .  Cholecalciferol (VITAMIN D3) 3000 UNITS TABS, Take 1,000 Units by mouth. , Disp: , Rfl:  .  cyanocobalamin 100 MCG tablet, Take 1,000 mcg by mouth daily. , Disp: , Rfl:  .  Melatonin 3 MG TABS, Take by mouth at bedtime., Disp: , Rfl:  .  Pramipexole Dihydrochloride 1.5 MG TB24, TAKE 1 TABLET  DAILY, Disp: 90 tablet, Rfl: 3   OBJECTIVE: BP 133/82 (BP Location: Right Arm, Patient Position: Sitting, Cuff Size: Normal)   Pulse 79   Temp 98.3 F (36.8 C) (Oral)   Resp 17   Ht 5\' 6"  (1.676 m)   Wt 183 lb 6.4 oz (83.2 kg)   SpO2 98%   BMI 29.60 kg/m   Constitutional: -  VS reviewed -  Well developed, well nourished, appears stated age -  No apparent distress  Psychiatric: -  Oriented to person, place, and time -  Memory intact -  Affect and mood normal -  Fluent conversation, good eye contact -  Judgment and insight age appropriate  Cardiovascular: -  RRR, no murmurs -  No LE edema  Respiratory: -  Normal respiratory effort, no accessory muscle use, no retraction -  Breath sounds equal, no wheezes, no ronchi, no crackles  Neurological:  -  Feeble voice, reasonable facial expression, +resting tremor of R hand, +shuffling gait   ASSESSMENT/PLAN: Parkinson disease ([place])  Establishing care today. His previous PCP was a Financial controller physician so I am able to see all of his records. Patient should return in Oct for a cpx. The patient voiced understanding and agreement to the plan.   Crosby Oyster [place]

## 2016-05-22 NOTE — Patient Instructions (Signed)
Call Dr. Hilarie Fredrickson, your gastroenterologist, for follow up for your colonoscopy.

## 2016-05-22 NOTE — Progress Notes (Signed)
Pre visit review using our clinic review tool, if applicable. No additional management support is needed unless otherwise documented below in the visit note. 

## 2016-05-27 ENCOUNTER — Ambulatory Visit: Payer: BLUE CROSS/BLUE SHIELD | Admitting: Speech Pathology

## 2016-05-27 ENCOUNTER — Ambulatory Visit: Payer: BLUE CROSS/BLUE SHIELD | Attending: Internal Medicine | Admitting: Occupational Therapy

## 2016-05-27 DIAGNOSIS — R29818 Other symptoms and signs involving the nervous system: Secondary | ICD-10-CM

## 2016-05-27 DIAGNOSIS — R251 Tremor, unspecified: Secondary | ICD-10-CM

## 2016-05-27 DIAGNOSIS — R278 Other lack of coordination: Secondary | ICD-10-CM

## 2016-05-27 DIAGNOSIS — R2689 Other abnormalities of gait and mobility: Secondary | ICD-10-CM | POA: Diagnosis present

## 2016-05-27 DIAGNOSIS — R293 Abnormal posture: Secondary | ICD-10-CM | POA: Diagnosis present

## 2016-05-27 DIAGNOSIS — R471 Dysarthria and anarthria: Secondary | ICD-10-CM

## 2016-05-27 DIAGNOSIS — R1312 Dysphagia, oropharyngeal phase: Secondary | ICD-10-CM | POA: Insufficient documentation

## 2016-05-27 NOTE — Patient Instructions (Signed)
(  Exercise) Monday Tuesday Wednesday Thursday Friday Saturday Sunday   PWR! seated           PWR! standing           PWR! Supine (on back)           PWR! Quadraped (on all 4's)           PWR! Hands (everyday)            Aerobic exercise (stationary bike 11min)  4-5x/week           Big Walking            Coordination exercises  (4x week, 64min)

## 2016-05-27 NOTE — Therapy (Signed)
[facility] [address] [place], [place], [address] Phone: [phone]   Fax:  [phone]  Occupational Therapy Treatment  Patient Details  Name: [name] [name] MRN: [id number] Date of Birth: 1951/01/15 Referring Provider: Dr. Carles Collet  Encounter Date: 05/27/2016      OT End of Session - 05/27/16 1302    Visit Number 4   Number of Visits 17   Date for OT Re-Evaluation 06/27/16   Authorization Type MCR   Authorization - Visit Number 4   Authorization - Number of Visits 10   OT Start Time 0850   OT Stop Time 0930   OT Time Calculation (min) 40 min   Activity Tolerance Patient tolerated treatment well   Behavior During Therapy [facility] for tasks assessed/performed      Past Medical History:  Diagnosis Date  . Cervical lymphadenopathy    right - followed my ENT  . Dystonia 1994   diagnosed in [place]  . Non Hodgkin's lymphoma ([place]) 2007   diffuse- 6 cycles of chemo with R CHOP Rituxan - last dose 10/08  . Parkinson disease ([place])    2015    Past Surgical History:  Procedure Laterality Date  . CHOLECYSTECTOMY, LAPAROSCOPIC    . EYE SURGERY     x2 as child  . PORT-A-CATH REMOVAL    . PORTACATH PLACEMENT    . SPLENECTOMY    . TONSILLECTOMY      There were no vitals filed for this visit.      Subjective Assessment - 05/27/16 1257    Subjective  Pt reports that he has been doing his exercises at home   Patient Stated Goals To be able to write better   Currently in Pain? No/denies      Neuro re-ed:  PWR! Moves (basic 4) in sitting  x 10-20 each with min cues For incr movement amplitude/slow down.  PWR! Moves Hands  x 10 each with min cues For incr movement amplitude/slow down.                             OT Education - 05/27/16 1257    Education Details PWR! Quadraped (basic 4); PD exercise chart; Reviewed benefits of aerobic ex; Recommended big walking as part of exercise   Person(s) Educated Patient   Methods Explanation;Demonstration;Verbal cues;Handout   Comprehension Verbalized understanding;Verbal cues required;Returned demonstration  min cues          OT Short Term Goals - 04/29/16 1143      OT SHORT TERM GOAL #1   Title I with PD specific HEP.   Time 4   Period Weeks   Status New     OT SHORT TERM GOAL #2   Title Pt will verbalize understanding of adapted strategies to maximize safety and independence with ADLs/ IADLs.   Time 4   Period Weeks   Status New     OT SHORT TERM GOAL #3   Title Pt will demonstrate increased [name] functional use as evidenced by increasing box/ blocks score by 4 blocks.   Time 4   Period Weeks   Status New     OT SHORT TERM GOAL #4   Title Pt will demonstrate ability to write a sentence with 95% legibility and only minimal decrease in letter size.   Time 4   Period Weeks   Status New     OT SHORT TERM GOAL #5  Title Pt will verbalize understanding of strategies to keep thinking skills sharp.   Time 4   Period Weeks   Status New           OT Long Term Goals - 04/29/16 1145      OT LONG TERM GOAL #1   Title Pt will demonstrate increased ease with dressing as evidenced by decreasing 3 button/ unbutton to 35 secs or less.   Baseline 44.75 secs   Time 8   Period Weeks   Status New     OT LONG TERM GOAL #2   Title Pt will demonstrate increased ease with feeding as evidenced by decreasing PPT#2 to 15 secs or less.   Baseline 18.16 secs   Time 8   Period Weeks   Status New     OT LONG TERM GOAL #3   Title Pt will verbalize understanding of ways to prevent future PD-related complications and will verbalize understanding of community resources.   Time 8   Period Weeks   Status New               Plan - 05/27/16 ID:[id number]    Clinical Impression Statement Pt is progressing towards goals and demo good understanding of PWR! moves in sitting and quadraped.   Rehab Potential Good   OT Frequency 2x  / week   OT Duration 8 weeks   Plan review PWR! quadraped and coordination activities   OT Home Exercise Plan Education provided:  supine PWR, quadraped PWR, PWR! hands, (PWR! seated and standing from PT),    Consulted and Agree with Plan of Care Patient      Patient will benefit from skilled therapeutic intervention in order to improve the following deficits and impairments:  Abnormal gait, Decreased coordination, Impaired flexibility, Impaired UE functional use, Decreased knowledge of use of DME, Decreased balance, Decreased mobility, Decreased strength  Visit Diagnosis: Other symptoms and signs involving the nervous system  Abnormal posture  Tremor, unspecified  Other lack of coordination  Other abnormalities of gait and mobility    Problem List Patient Active Problem List   Diagnosis Date Noted  . Preventative health care 04/02/2015  . Constipation 04/02/2015  . 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 Dysphagia, neurologic 07/27/2014  . Parkinson disease ([place]) 07/27/2014  . Dystonia 07/10/2014  . Low back pain 11/21/2011  . TRANSAMINASES, SERUM, ELEVATED 07/06/2008  . DIARRHEA 05/23/2008  . LYMPH NODE-ENLARGED 07/28/2007  . Non-Hodgkin lymphoma ([place]) 03/23/2007    [facility] 05/27/2016, 1:07 PM  [facility] [address] [place], [place], [address] Phone: [phone]   Fax:  [phone]  Name: [name] [name] MRN: [id number] Date of Birth: 1950/11/27   Vianne Bulls, OTR/L [facility] [address] [place], [place]  [address] [phone] phone [phone] 05/27/16 1:07 PM

## 2016-05-27 NOTE — Therapy (Signed)
[facility] [address] [place], [place], [address] Phone: [phone]   Fax:  [phone]  Speech Language Pathology Treatment  Patient Details  Name: [name] [name] MRN: [id number] Date of Birth: 06/06/1951 Referring Provider: Dr. Wells Guiles Tat  Encounter Date: 05/27/2016      End of Session - 05/27/16 1016    Visit Number 3   Number of Visits 17   Date for SLP Re-Evaluation 07/01/16   SLP Start Time 0932   SLP Stop Time  1015   SLP Time Calculation (min) 43 min      Past Medical History:  Diagnosis Date  . Cervical lymphadenopathy    right - followed my ENT  . Dystonia 1994   diagnosed in [place]  . Non Hodgkin's lymphoma ([place]) 2007   diffuse- 6 cycles of chemo with R CHOP Rituxan - last dose 10/08  . Parkinson disease ([place])    2015    Past Surgical History:  Procedure Laterality Date  . CHOLECYSTECTOMY, LAPAROSCOPIC    . EYE SURGERY     x2 as child  . PORT-A-CATH REMOVAL    . PORTACATH PLACEMENT    . SPLENECTOMY    . TONSILLECTOMY      There were no vitals filed for this visit.      Subjective Assessment - 05/27/16 0938    Subjective "My Sunday school class says my voice is improving"   Currently in Pain? No/denies               ADULT SLP TREATMENT - 05/27/16 0939      General Information   Behavior/Cognition Alert;Cooperative;Pleasant mood     Treatment Provided   Treatment provided Cognitive-Linquistic     Pain Assessment   Pain Assessment No/denies pain     Cognitive-Linquistic Treatment   Treatment focused on Dysarthria   Skilled Treatment Loud volume recalibrated with average of 92dB and initial verbal cue for breath support. Structured speech task  reading words and telling which one does not belong and why with usual min cues for breath support and loud volume  with average of 69dB.  Simple conversation average of 70dB with usual min to mod visual and verbal cues.       Assessment / Recommendations / Plan   Plan Continue with current plan of care     Progression Toward Goals   Progression toward goals Progressing toward goals          SLP Education - 05/27/16 1010    Education provided Yes   Education Details Breath support needed for loud volume   Person(s) Educated Patient   Methods Explanation;Demonstration;Verbal cues   Comprehension Verbalized understanding;Returned demonstration;Verbal cues required          SLP Short Term Goals - 05/27/16 1015      SLP SHORT TERM GOAL #1   Title Pt will achieve 90dB average on loud /a/ over 3 sessions   Baseline 05/14/16   Time 3   Period Weeks   Status On-going     SLP SHORT TERM GOAL #2   Title Pt will mainitain average of 70dB during structured speech tasks and sentence level utteraces with occasional minc cues.   Time 3   Period Weeks   Status On-going     SLP SHORT TERM GOAL #3   Title Pt will follow HEP for dysphagia with occasional min A as tolerated   Time 3   Period Weeks   Status On-going  SLP Long Term Goals - 05/27/16 1016      SLP LONG TERM GOAL #1   Title Pt will maintain average of 70dB over 12 minute simple conversation with occasional min A   Time 7   Period Weeks   Status On-going     SLP LONG TERM GOAL #2   Title Pt will be audible over 15 minute conversation in noisy environment/outside of therpay room with min rare A x 2 sessions   Time 7   Period Weeks   Status On-going     SLP LONG TERM GOAL #3   Status On-going          Plan - 05/27/16 1012    Clinical Impression Statement Pt continues to require ongoing verbal, visual cues and instruction for loud volume and breath support - continue skilled ST to maximize intelligibility at conversation level and in loud environments.,   Speech Therapy Frequency 2x / week   Treatment/Interventions Functional tasks;Compensatory strategies;Multimodal communcation approach;SLP instruction and  feedback;Patient/family education;Environmental controls   Potential to Achieve Goals Good   Consulted and Agree with Plan of Care Patient      Patient will benefit from skilled therapeutic intervention in order to improve the following deficits and impairments:   Dysarthria and anarthria    Problem List Patient Active Problem List   Diagnosis Date Noted  . Preventative health care 04/02/2015  . Constipation 04/02/2015  . 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 Dysphagia, neurologic 07/27/2014  . Parkinson disease ([place]) 07/27/2014  . Dystonia 07/10/2014  . Low back pain 11/21/2011  . TRANSAMINASES, SERUM, ELEVATED 07/06/2008  . DIARRHEA 05/23/2008  . LYMPH NODE-ENLARGED 07/28/2007  . Non-Hodgkin lymphoma ([place]) 03/23/2007    [name], [name] MS, CCC-SLP 05/27/2016, 10:17 AM  [facility] [address] [place], [place], [address] Phone: [phone]   Fax:  [phone]   Name: [name] [name] MRN: [id number] Date of Birth: [birth year]

## 2016-05-29 ENCOUNTER — Ambulatory Visit: Payer: BLUE CROSS/BLUE SHIELD

## 2016-05-29 ENCOUNTER — Ambulatory Visit: Payer: BLUE CROSS/BLUE SHIELD | Admitting: Occupational Therapy

## 2016-05-29 DIAGNOSIS — R29818 Other symptoms and signs involving the nervous system: Secondary | ICD-10-CM

## 2016-05-29 DIAGNOSIS — R1312 Dysphagia, oropharyngeal phase: Secondary | ICD-10-CM

## 2016-05-29 DIAGNOSIS — R471 Dysarthria and anarthria: Secondary | ICD-10-CM

## 2016-05-29 DIAGNOSIS — R293 Abnormal posture: Secondary | ICD-10-CM

## 2016-05-29 DIAGNOSIS — R278 Other lack of coordination: Secondary | ICD-10-CM

## 2016-05-29 DIAGNOSIS — R251 Tremor, unspecified: Secondary | ICD-10-CM

## 2016-05-29 NOTE — Therapy (Signed)
[facility] [address] [place], [place], [address] Phone: [phone]   Fax:  [phone]  Occupational Therapy Treatment  Patient Details  Name: [name] [name] MRN: [id number] Date of Birth: [birth year] Referring Provider: Dr. Carles Collet  Encounter Date: 05/29/2016      OT End of Session - 05/29/16 1540    Visit Number 5   Number of Visits 17   Date for OT Re-Evaluation 06/27/16   Authorization Type MCR   Authorization - Visit Number 5   Authorization - Number of Visits 10   OT Start Time [id number]   OT Stop Time 1615   OT Time Calculation (min) 40 min   Activity Tolerance Patient tolerated treatment well   Behavior During Therapy [facility] for tasks assessed/performed      Past Medical History:  Diagnosis Date  . Cervical lymphadenopathy    right - followed my ENT  . Dystonia 1994   diagnosed in [place]  . Non Hodgkin's lymphoma ([place]) 2007   diffuse- 6 cycles of chemo with R CHOP Rituxan - last dose 10/08  . Parkinson disease ([place])    2015    Past Surgical History:  Procedure Laterality Date  . CHOLECYSTECTOMY, LAPAROSCOPIC    . EYE SURGERY     x2 as child  . PORT-A-CATH REMOVAL    . PORTACATH PLACEMENT    . SPLENECTOMY    . TONSILLECTOMY      There were no vitals filed for this visit.      Subjective Assessment - 05/29/16 1636    Subjective  Pt reports that he has been doing his exercises at home   Patient Stated Goals To be able to write better   Currently in Pain? No/denies        Treatment: Reviewed flipping/ dealing cards, rotating/ tossing ball from HEP, min v.c./ demonstration for larger amplitude movements. Flicking playing cards to target for large amplitude finger extension bilaterally, min v.c for technique. Pt to bring in exercise flowsheet to discuss with therapist.                 [facility]) - 05/29/16 1638    PWR! exercises Moves in [place]! Up x10   PWR! Rock  x10   PWR! Twist x10   PWR! Step x10   Comments min v.c.               OT Short Term Goals - 04/29/16 1143      OT SHORT TERM GOAL #1   Title I with PD specific HEP.   Time 4   Period Weeks   Status New     OT SHORT TERM GOAL #2   Title Pt will verbalize understanding of adapted strategies to maximize safety and independence with ADLs/ IADLs.   Time 4   Period Weeks   Status New     OT SHORT TERM GOAL #3   Title Pt will demonstrate increased [name] functional use as evidenced by increasing box/ blocks score by 4 blocks.   Time 4   Period Weeks   Status New     OT SHORT TERM GOAL #4   Title Pt will demonstrate ability to write a sentence with 95% legibility and only minimal decrease in letter size.   Time 4   Period Weeks   Status New     OT SHORT TERM GOAL #5   Title Pt will verbalize understanding of strategies to keep thinking skills  sharp.   Time 4   Period Weeks   Status New           OT Long Term Goals - 04/29/16 1145      OT LONG TERM GOAL #1   Title Pt will demonstrate increased ease with dressing as evidenced by decreasing 3 button/ unbutton to 35 secs or less.   Baseline 44.75 secs   Time 8   Period Weeks   Status New     OT LONG TERM GOAL #2   Title Pt will demonstrate increased ease with feeding as evidenced by decreasing PPT#2 to 15 secs or less.   Baseline 18.16 secs   Time 8   Period Weeks   Status New     OT LONG TERM GOAL #3   Title Pt will verbalize understanding of ways to prevent future PD-related complications and will verbalize understanding of community resources.   Time 8   Period Weeks   Status New               Plan - 05/29/16 1628    Clinical Impression Statement Pt is progressing towards goals. He demonstrates good understanding of PWR! moves in quadraped following review.   Rehab Potential Good   OT Frequency 2x / week   OT Duration 8 weeks   OT Treatment/Interventions Self-care/ADL training;Moist  Heat;Fluidtherapy;DME and/or AE instruction;Patient/family education;Balance training;Therapeutic exercises;Ultrasound;Therapeutic exercise;Therapeutic activities;Cognitive remediation/compensation;Passive range of motion;Neuromuscular education;Cryotherapy;Parrafin;Energy conservation;Manual Therapy   Plan check STG's   OT Home Exercise Plan Education provided:  supine PWR, quadraped PWR, PWR! hands, (PWR! seated and standing from PT),    Consulted and Agree with Plan of Care Patient      Patient will benefit from skilled therapeutic intervention in order to improve the following deficits and impairments:  Abnormal gait, Decreased coordination, Impaired flexibility, Impaired UE functional use, Decreased knowledge of use of DME, Decreased balance, Decreased mobility, Decreased strength  Visit Diagnosis: Other symptoms and signs involving the nervous system  Abnormal posture  Tremor, unspecified  Other lack of coordination    Problem List Patient Active Problem List   Diagnosis Date Noted  . Preventative health care 04/02/2015  . Constipation 04/02/2015  . 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 Dysphagia, neurologic 07/27/2014  . Parkinson disease ([place]) 07/27/2014  . Dystonia 07/10/2014  . Low back pain 11/21/2011  . TRANSAMINASES, SERUM, ELEVATED 07/06/2008  . DIARRHEA 05/23/2008  . LYMPH NODE-ENLARGED 07/28/2007  . Non-Hodgkin lymphoma ([place]) 03/23/2007    RINE,KATHRYN 05/29/2016, 4:44 PM Theone Murdoch, OTR/L [phone] Phone: [phone] 4:44 PM 05/29/16 [facility] [address] [place], [place], [address] Phone: [phone]   Fax:  [phone]  Name: [name] [name] MRN: [id number] Date of Birth: [birth year]

## 2016-05-29 NOTE — Therapy (Signed)
[facility] [address] [place], [place], [address] Phone: [phone]   Fax:  [phone]  Speech Language Pathology Treatment  Patient Details  Name: [name] [name] MRN: [id number] Date of Birth: 09-13-1951 Referring Provider: Dr. Wells Guiles Tat  Encounter Date: 05/29/2016      End of Session - 05/29/16 1539    Visit Number 4   Number of Visits 17   Date for SLP Re-Evaluation 07/01/16   SLP Start Time 1448   SLP Stop Time  1530   SLP Time Calculation (min) 42 min   Activity Tolerance Patient tolerated treatment well      Past Medical History:  Diagnosis Date  . Cervical lymphadenopathy    right - followed my ENT  . Dystonia 1994   diagnosed in [place]  . Non Hodgkin's lymphoma ([place]) 2007   diffuse- 6 cycles of chemo with R CHOP Rituxan - last dose 10/08  . Parkinson disease ([place])    2015    Past Surgical History:  Procedure Laterality Date  . CHOLECYSTECTOMY, LAPAROSCOPIC    . EYE SURGERY     x2 as child  . PORT-A-CATH REMOVAL    . PORTACATH PLACEMENT    . SPLENECTOMY    . TONSILLECTOMY      There were no vitals filed for this visit.      Subjective Assessment - 05/29/16 1455    Currently in Pain? 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 In 4 minutes simple conversation prior to loud /a/ pt maintained volume 68dB independently. Loud /a/ was used to recalibrate pt's conversational loudness - loudness average 93dB. In sentence tasks, pt with average 71dB with rare min A. Because of pt's success, multi sentence tasks were  completed and pt req'd rare min A in order to achieve 71dB. In 6 minutes simple to mod complex conversation of 6 min, pt was 69dB.     Assessment / Recommendations / Plan   Plan Continue with current plan of care     Progression Toward Goals   Progression toward goals Progressing toward goals            SLP Short Term Goals - 05/29/16 1543      SLP SHORT TERM GOAL #1   Title Pt will achieve 90dB average on loud /a/ over 3 sessions   Baseline --   Time --   Period --   Status Achieved     SLP SHORT TERM GOAL #2   Title Pt will mainitain average of 70dB during structured speech tasks and sentence level utteraces with occasional minc cues over two sessions   Time 2   Period Weeks   Status Revised     SLP SHORT TERM GOAL #3   Title Pt will follow HEP for dysphagia with occasional min A as tolerated   Time 2   Period Weeks   Status On-going          SLP Long Term Goals - 05/29/16 1544      SLP LONG TERM GOAL #  1   Title Pt will maintain average of 70dB over 12 minute simple conversation with rare min A over three sessions   Time 6   Period Weeks   Status Revised     SLP LONG TERM GOAL #2   Title Pt will be audible over 15 minute conversation in noisy environment/outside of therpay room with min rare A x 2 sessions   Time 6   Period Weeks   Status On-going     SLP LONG TERM GOAL #3   Status On-going          Plan - 05/29/16 1540    Clinical Impression Statement Pt required intermittent cues for loud volume and breath support in strucutred tasks and in conversation, less frequent cues as the session progressed. Continue skilled ST to maximize intelligibility at conversation level and in loud environments.,   Speech Therapy Frequency 2x / week   Duration --  6 weeks   Treatment/Interventions Functional tasks;Compensatory strategies;Multimodal communcation approach;SLP instruction and feedback;Patient/family education;Environmental controls   Potential to  Achieve Goals Good   Consulted and Agree with Plan of Care Patient      Patient will benefit from skilled therapeutic intervention in order to improve the following deficits and impairments:   Dysarthria and anarthria  Oropharyngeal dysphagia    Problem List Patient Active Problem List   Diagnosis Date Noted  . Preventative health care 04/02/2015  . Constipation 04/02/2015  . 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 Dysphagia, neurologic 07/27/2014  . Parkinson disease ([place]) 07/27/2014  . Dystonia 07/10/2014  . Low back pain 11/21/2011  . TRANSAMINASES, SERUM, ELEVATED 07/06/2008  . DIARRHEA 05/23/2008  . LYMPH NODE-ENLARGED 07/28/2007  . Non-Hodgkin lymphoma ([place]) 03/23/2007    [facility] ,[place], [place]  05/29/2016, 3:45 PM  [facility] [address] [place], [place], [address] Phone: [phone]   Fax:  [phone]   Name: [name] [name] MRN: [id number] Date of Birth: March 30, 1951

## 2016-06-03 ENCOUNTER — Ambulatory Visit: Payer: BLUE CROSS/BLUE SHIELD | Admitting: Occupational Therapy

## 2016-06-03 ENCOUNTER — Ambulatory Visit: Payer: BLUE CROSS/BLUE SHIELD

## 2016-06-03 DIAGNOSIS — R29818 Other symptoms and signs involving the nervous system: Secondary | ICD-10-CM | POA: Diagnosis not present

## 2016-06-03 DIAGNOSIS — R471 Dysarthria and anarthria: Secondary | ICD-10-CM

## 2016-06-03 DIAGNOSIS — R1312 Dysphagia, oropharyngeal phase: Secondary | ICD-10-CM

## 2016-06-03 DIAGNOSIS — R278 Other lack of coordination: Secondary | ICD-10-CM

## 2016-06-03 DIAGNOSIS — R293 Abnormal posture: Secondary | ICD-10-CM

## 2016-06-03 DIAGNOSIS — R251 Tremor, unspecified: Secondary | ICD-10-CM

## 2016-06-03 NOTE — Therapy (Signed)
[facility] [address] [place], [place], [address] Phone: [phone]   Fax:  [phone]  Speech Language Pathology Treatment  Patient Details  Name: [name] [name] MRN: [id number] Date of Birth: [birth year] Referring Provider: Dr. Wells Guiles Tat  Encounter Date: 06/03/2016      End of Session - 06/03/16 1449    Visit Number 5   Number of Visits 17   Date for SLP Re-Evaluation 07/01/16   SLP Start Time 69   SLP Stop Time  1446   SLP Time Calculation (min) 44 min   Activity Tolerance Patient tolerated treatment well      Past Medical History:  Diagnosis Date  . Cervical lymphadenopathy    right - followed my ENT  . Dystonia 1994   diagnosed in [place]  . Non Hodgkin's lymphoma ([place]) 2007   diffuse- 6 cycles of chemo with R CHOP Rituxan - last dose 10/08  . Parkinson disease ([place])    2015    Past Surgical History:  Procedure Laterality Date  . CHOLECYSTECTOMY, LAPAROSCOPIC    . EYE SURGERY     x2 as child  . PORT-A-CATH REMOVAL    . PORTACATH PLACEMENT    . SPLENECTOMY    . TONSILLECTOMY      There were no vitals filed for this visit.      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 No/denies               ADULT SLP TREATMENT - 06/03/16 1421      General Information   Behavior/Cognition Alert;Cooperative;Pleasant mood     Treatment Provided   Treatment provided Cognitive-Linquistic     Cognitive-Linquistic Treatment   Treatment focused on Dysarthria   Skilled Treatment SLP facilitated patient's incr'd conversational volume by loud /a/ at average 93dB and rare min A for loudness. In conversation to habitualize louder WNL speech loudness in conversation, SLP chose to keep conversation simple for 7 minutes and pt maintained 70dB with occasional min A for loudness. In mod complex conversation pt only  req'd SBA due to pt utilizing external cues of SLP looking at sound level meter. SLP engaged pt to think about what are some external cues he uses or could use outside of therapy session. Pt thought of three other external cues. SLP educated pt about what "more effort" means physiologically for speech volume.     Assessment / Recommendations / Plan   Plan Continue with current plan of care     Progression Toward Goals   Progression toward goals Progressing toward goals          SLP Education - 06/03/16 1448    Education provided Yes   Education Details what the physiological change is with loud /a/ and incr'd effort with speech   Person(s) Educated Patient   Methods Explanation   Comprehension Verbalized understanding          SLP Short Term Goals - 06/03/16 1450      SLP SHORT TERM GOAL #1   Title Pt will achieve 90dB average on loud /a/ over 3 sessions   Status Achieved     SLP SHORT TERM GOAL #2   Title Pt will mainitain average of 70dB during structured speech tasks and sentence level utteraces with occasional minc cues over two sessions   Status Achieved     SLP SHORT TERM  GOAL #3   Title Pt will follow HEP for dysphagia with occasional min A as tolerated   Time 2   Period Weeks   Status On-going          SLP Long Term Goals - 06/03/16 1451      SLP LONG TERM GOAL #1   Title Pt will maintain average of 70dB over 12 minute simple conversation with rare min A over three sessions   Time 5   Period Weeks   Status Revised     SLP LONG TERM GOAL #2   Title Pt will be audible over 15 minute conversation in noisy environment/outside of therpay room with min rare A x 2 sessions   Time 5   Period Weeks   Status On-going     SLP LONG TERM GOAL #3   Status On-going          Plan - 06/03/16 1449    Clinical Impression Statement Pt required intermittent cues for loud volume and breath support in conversation .Pt using external cues for assistance with louder  WNL speech volume in conversation. Educated today re: physiologic changes with loud /a/ and WNL speech volume. Continue skilled ST to maximize intelligibility at conversation level and in loud environments.,   Speech Therapy Frequency 2x / week   Duration --  5 weeks   Treatment/Interventions Functional tasks;Compensatory strategies;Multimodal communcation approach;SLP instruction and feedback;Patient/family education;Environmental controls   Potential to Achieve Goals Good   Consulted and Agree with Plan of Care Patient      Patient will benefit from skilled therapeutic intervention in order to improve the following deficits and impairments:   Dysarthria and anarthria  Oropharyngeal dysphagia    Problem List Patient Active Problem List   Diagnosis Date Noted  . Preventative health care 04/02/2015  . Constipation 04/02/2015  . 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 Dysphagia, neurologic 07/27/2014  . Parkinson disease ([place]) 07/27/2014  . Dystonia 07/10/2014  . Low back pain 11/21/2011  . TRANSAMINASES, SERUM, ELEVATED 07/06/2008  . DIARRHEA 05/23/2008  . LYMPH NODE-ENLARGED 07/28/2007  . Non-Hodgkin lymphoma ([place]) 03/23/2007    [facility] ,[place], [place]  06/03/2016, 2:52 PM  [facility] [address] [place], [place], [address] Phone: [phone]   Fax:  [phone]   Name: [name] [name] MRN: [id number] Date of Birth: [birth year]

## 2016-06-03 NOTE — Therapy (Signed)
[facility] [address] [place], [place], [address] Phone: [phone]   Fax:  [phone]  Occupational Therapy Treatment  Patient Details  Name: [name] [name] MRN: [id number] Date of Birth: 11/12/1950 Referring Provider: Dr. Carles Collet  Encounter Date: 06/03/2016      OT End of Session - 06/03/16 1742    Visit Number 6   Number of Visits 17   Date for OT Re-Evaluation 06/27/16   Authorization - Visit Number 6   Authorization - Number of Visits 10   OT Start Time 1450   OT Stop Time 1530   OT Time Calculation (min) 40 min   Activity Tolerance Patient tolerated treatment well   Behavior During Therapy [facility] for tasks assessed/performed      Past Medical History:  Diagnosis Date  . Cervical lymphadenopathy    right - followed my ENT  . Dystonia 1994   diagnosed in [facility]  . Non Hodgkin's lymphoma ([place]) 2007   diffuse- 6 cycles of chemo with R CHOP Rituxan - last dose 10/08  . Parkinson disease ([place])    2015    Past Surgical History:  Procedure Laterality Date  . CHOLECYSTECTOMY, LAPAROSCOPIC    . EYE SURGERY     x2 as child  . PORT-A-CATH REMOVAL    . PORTACATH PLACEMENT    . SPLENECTOMY    . TONSILLECTOMY      There were no vitals filed for this visit.      Subjective Assessment - 06/03/16 1457    Subjective  Pt reports that he has been doing his exercises at home   Patient Stated Goals To be able to write better   Currently in Pain? No/denies         Therapist reports exercises are helping and he was able to get from the floor without assist and without holding on to anything. Therapist discussed exercise flowsheets with pt, and he reports being diligent about exercises. Education provided regarding handwriting and handout was issued. Pt requires continued v.c. To slow down and write larger. Pt became easily frustrated regarding handwriting and pt does not wish to use a foam grip. Pt  demonstrates overall improved handwriting. Boxing with multi directional stepping and hitting punching bag in a specific sequence, min v.c. While wearing boxing gloves.  Pt reported writ pain after task yet it was dissapating at end of session.. Pt was instructed to monitor and use ice if needed at home.                       OT Short Term Goals - 06/03/16 1458      OT SHORT TERM GOAL #1   Title I with PD specific HEP.   Time 4   Period Weeks   Status Achieved     OT SHORT TERM GOAL #2   Title Pt will verbalize understanding of adapted strategies to maximize safety and independence with ADLs/ IADLs.   Time 4   Period Weeks   Status On-going     OT SHORT TERM GOAL #3   Title Pt will demonstrate increased [name] functional use as evidenced by increasing box/ blocks score by 4 blocks.   Baseline --   Time 4   Status Achieved  54 blocks, 63 blocks     OT SHORT TERM GOAL #4   Title Pt will demonstrate ability to write a sentence with 95% legibility and only minimal decrease in letter size.  Status Achieved     OT SHORT TERM GOAL #5   Title Pt will verbalize understanding of strategies to keep thinking skills sharp.   Status On-going           OT Long Term Goals - 04/29/16 1145      OT LONG TERM GOAL #1   Title Pt will demonstrate increased ease with dressing as evidenced by decreasing 3 button/ unbutton to 35 secs or less.   Baseline 44.75 secs   Time 8   Period Weeks   Status New     OT LONG TERM GOAL #2   Title Pt will demonstrate increased ease with feeding as evidenced by decreasing PPT#2 to 15 secs or less.   Baseline 18.16 secs   Time 8   Period Weeks   Status New     OT LONG TERM GOAL #3   Title Pt will verbalize understanding of ways to prevent future PD-related complications and will verbalize understanding of community resources.   Time 8   Period Weeks   Status New               Plan - 06/03/16 1741    Clinical Impression  Statement Pt is progressing towards short term goals with improving legibility of hand writing and pt demonstrates improved [name] functional use as evidenced by meeting box/ blocks goal.   Rehab Potential Good   OT Frequency 2x / week   OT Duration 8 weeks   OT Treatment/Interventions Self-care/ADL training;Moist Heat;Fluidtherapy;DME and/or AE instruction;Patient/family education;Balance training;Therapeutic exercises;Ultrasound;Therapeutic exercise;Therapeutic activities;Cognitive remediation/compensation;Passive range of motion;Neuromuscular education;Cryotherapy;Parrafin;Energy conservation;Manual Therapy   Plan finish checking short term goals   OT Home Exercise Plan Education provided:  supine PWR, quadraped PWR, PWR! hands, (PWR! seated and standing from PT),    Consulted and Agree with Plan of Care Patient      Patient will benefit from skilled therapeutic intervention in order to improve the following deficits and impairments:  Abnormal gait, Decreased coordination, Impaired flexibility, Impaired UE functional use, Decreased knowledge of use of DME, Decreased balance, Decreased mobility, Decreased strength  Visit Diagnosis: Other symptoms and signs involving the nervous system  Abnormal posture  Tremor, unspecified  Other lack of coordination    Problem List Patient Active Problem List   Diagnosis Date Noted  . Preventative health care 04/02/2015  . Constipation 04/02/2015  . 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 Dysphagia, neurologic 07/27/2014  . Parkinson disease ([place]) 07/27/2014  . Dystonia 07/10/2014  . Low back pain 11/21/2011  . TRANSAMINASES, SERUM, ELEVATED 07/06/2008  . DIARRHEA 05/23/2008  . LYMPH NODE-ENLARGED 07/28/2007  . Non-Hodgkin lymphoma ([place]) 03/23/2007    RINE,KATHRYN 06/03/2016, 5:45 PM  [facility] [address] [place], [place], [address] Phone: [phone]   Fax:  [phone]  Name: [name] [name] MRN: [id number] Date of Birth: Feb 14, 1951

## 2016-06-06 ENCOUNTER — Ambulatory Visit: Payer: BLUE CROSS/BLUE SHIELD | Admitting: Occupational Therapy

## 2016-06-06 ENCOUNTER — Ambulatory Visit: Payer: BLUE CROSS/BLUE SHIELD

## 2016-06-10 ENCOUNTER — Ambulatory Visit: Payer: BLUE CROSS/BLUE SHIELD | Admitting: Occupational Therapy

## 2016-06-10 ENCOUNTER — Ambulatory Visit: Payer: BLUE CROSS/BLUE SHIELD | Admitting: Speech Pathology

## 2016-06-10 DIAGNOSIS — R293 Abnormal posture: Secondary | ICD-10-CM

## 2016-06-10 DIAGNOSIS — R251 Tremor, unspecified: Secondary | ICD-10-CM

## 2016-06-10 DIAGNOSIS — R29818 Other symptoms and signs involving the nervous system: Secondary | ICD-10-CM

## 2016-06-10 DIAGNOSIS — R471 Dysarthria and anarthria: Secondary | ICD-10-CM

## 2016-06-10 DIAGNOSIS — R2689 Other abnormalities of gait and mobility: Secondary | ICD-10-CM

## 2016-06-10 DIAGNOSIS — R278 Other lack of coordination: Secondary | ICD-10-CM

## 2016-06-10 NOTE — Therapy (Signed)
[facility] [address] [place], [place], [address] Phone: [phone]   Fax:  [phone]  Speech Language Pathology Treatment  Patient Details  Name: RENELL MAIDONADO MRN: [id number] Date of Birth: Apr 30, 1951 Referring Provider: Dr. Wells Guiles Tat  Encounter Date: 06/10/2016      End of Session - 06/10/16 1017    Visit Number 6   Number of Visits 17   Date for SLP Re-Evaluation 07/01/16   SLP Start Time 0930   SLP Stop Time  [id number]   SLP Time Calculation (min) 42 min      Past Medical History:  Diagnosis Date  . Cervical lymphadenopathy    right - followed my ENT  . Dystonia 1994   diagnosed in [place]  . Non Hodgkin's lymphoma ([place]) 2007   diffuse- 6 cycles of chemo with R CHOP Rituxan - last dose 10/08  . Parkinson disease ([place])    2015    Past Surgical History:  Procedure Laterality Date  . CHOLECYSTECTOMY, LAPAROSCOPIC    . EYE SURGERY     x2 as child  . PORT-A-CATH REMOVAL    . PORTACATH PLACEMENT    . SPLENECTOMY    . TONSILLECTOMY      There were no vitals filed for this visit.      Subjective Assessment - 06/10/16 0934    Subjective "I'm talking loud"   Currently in Pain? No/denies               ADULT SLP TREATMENT - 06/10/16 0935      General Information   Behavior/Cognition Alert;Cooperative;Pleasant mood     Treatment Provided   Treatment provided Cognitive-Linquistic     Cognitive-Linquistic Treatment   Treatment focused on Dysarthria   Skilled Treatment Loud /a/ with average 92dB to recalibrate volume. Simple conversation average of 70dB over 15 minutes. Outside of therapy room walking near busy road - pt maintained audible volume outside of therapy over 15 minutes with rare min A.      Assessment / Recommendations / Plan   Plan Continue with current plan of care     Progression Toward Goals   Progression toward goals Progressing toward goals          SLP  Education - 06/10/16 1007    Education provided Yes   Education Details Need for more frequent breaths to maintainn loudness   Person(s) Educated Patient   Methods Explanation;Verbal cues   Comprehension Verbalized understanding;Returned demonstration          SLP Short Term Goals - 06/10/16 1017      SLP SHORT TERM GOAL #1   Title Pt will achieve 90dB average on loud /a/ over 3 sessions   Status Achieved     SLP SHORT TERM GOAL #2   Title Pt will mainitain average of 70dB during structured speech tasks and sentence level utteraces with occasional minc cues over two sessions   Status Achieved     SLP SHORT TERM GOAL #3   Title Pt will follow HEP for dysphagia with occasional min A as tolerated   Time 2   Period Weeks   Status On-going          SLP Long Term Goals - 06/10/16 1017      SLP LONG TERM GOAL #1   Title Pt will maintain average of 70dB over 12 minute simple conversation with rare min A over three sessions   Baseline 06/10/16;   Time 4  Period Weeks   Status Revised     SLP LONG TERM GOAL #2   Title Pt will be audible over 15 minute conversation in noisy environment/outside of therpay room with min rare A x 2 sessions   Baseline 06/10/16;   Time 4   Period Weeks   Status On-going     SLP LONG TERM GOAL #3   Status On-going          Plan - 06/10/16 1014    Clinical Impression Statement Pt required supervision cues for volume/breath support in simple conversation. 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 Pt reported that he is using peoples facial expressions, body posture for ongoing cues of how well he is being understood. Pt spoke for 15 minutes in while walking in noisy environment with rare min A. Continue skilled ST to maximize carryover of volume to improve intellgibility.   Speech Therapy Frequency 2x / week   Treatment/Interventions Functional tasks;Compensatory strategies;Multimodal communcation  approach;SLP instruction and feedback;Patient/family education;Environmental controls   Potential to Achieve Goals Good   Consulted and Agree with Plan of Care Patient      Patient will benefit from skilled therapeutic intervention in order to improve the following deficits and impairments:   Dysarthria and anarthria    Problem List Patient Active Problem List   Diagnosis Date Noted  . Preventative health care 04/02/2015  . Constipation 04/02/2015  . 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 Dysphagia, neurologic 07/27/2014  . Parkinson disease ([place]) 07/27/2014  . Dystonia 07/10/2014  . Low back pain 11/21/2011  . TRANSAMINASES, SERUM, ELEVATED 07/06/2008  . DIARRHEA 05/23/2008  . LYMPH NODE-ENLARGED 07/28/2007  . Non-Hodgkin lymphoma ([place]) 03/23/2007    Amai Cappiello, [name]  MS, CCC-SLP 06/10/2016, 10:18 AM  [facility] [address] [place], [place], [address] Phone: [phone]   Fax:  [phone]   Name: CRYSTIAN TIMBERS MRN: [id number] Date of Birth: [birth year]

## 2016-06-10 NOTE — Therapy (Signed)
[facility] [address] [place], [place], [address] Phone: [phone]   Fax:  [phone]  Occupational Therapy Treatment  Patient Details  Name: [name] [name] MRN: [id number] Date of Birth: 01-20-1951 Referring Provider: Dr. Carles Collet  Encounter Date: 06/10/2016      OT End of Session - 06/10/16 0922    Visit Number 7   Number of Visits 17   Date for OT Re-Evaluation 06/27/16   Authorization Type MCR   Authorization - Visit Number 7   Authorization - Number of Visits 10   OT Start Time 0848   OT Stop Time 0930   OT Time Calculation (min) 42 min   Activity Tolerance Patient tolerated treatment well   Behavior During Therapy [facility] for tasks assessed/performed      Past Medical History:  Diagnosis Date  . Cervical lymphadenopathy    right - followed my ENT  . Dystonia 1994   diagnosed in [place]  . Non Hodgkin's lymphoma ([place]) 2007   diffuse- 6 cycles of chemo with R CHOP Rituxan - last dose 10/08  . Parkinson disease ([place])    2015    Past Surgical History:  Procedure Laterality Date  . CHOLECYSTECTOMY, LAPAROSCOPIC    . EYE SURGERY     x2 as child  . PORT-A-CATH REMOVAL    . PORTACATH PLACEMENT    . SPLENECTOMY    . TONSILLECTOMY      There were no vitals filed for this visit.      Subjective Assessment - 06/10/16 0920    Subjective  Pt reports he is getting up and down from the floor easier since his exercises.   Patient Stated Goals To be able to write better   Currently in Pain? No/denies           Dynamic step and reach with trunk rotation to toss scarves to targets with large amplitude movements, min v.c. For larger amplitude movements, velocity  Step and reach  to copy peg design on vertical surface alternating reaching and stepping to each side for cognitive component. education regarding ways to keeping thinking skills sharp, pt verbalized understanding, handout  provided.             PWR [facility]) - 06/10/16 [id number]    PWR! exercises Moves in [place]! Up x10   PWR! Rock x10   PWR! Twist x10   PWR! Step x10   Comments min v.c. for positioning               OT Short Term Goals - 06/03/16 1458      OT SHORT TERM GOAL #1   Title I with PD specific HEP.   Time 4   Period Weeks   Status Achieved     OT SHORT TERM GOAL #2   Title Pt will verbalize understanding of adapted strategies to maximize safety and independence with ADLs/ IADLs.   Time 4   Period Weeks   Status On-going     OT SHORT TERM GOAL #3   Title Pt will demonstrate increased [name] functional use as evidenced by increasing box/ blocks score by 4 blocks.   Baseline --   Time 4   Status Achieved  54 blocks, 63 blocks     OT SHORT TERM GOAL #4   Title Pt will demonstrate ability to write a sentence with 95% legibility and only minimal decrease in letter size.   Status Achieved  OT SHORT TERM GOAL #5   Title Pt will verbalize understanding of strategies to keep thinking skills sharp.   Status On-going           OT Long Term Goals - 04/29/16 1145      OT LONG TERM GOAL #1   Title Pt will demonstrate increased ease with dressing as evidenced by decreasing 3 button/ unbutton to 35 secs or less.   Baseline 44.75 secs   Time 8   Period Weeks   Status New     OT LONG TERM GOAL #2   Title Pt will demonstrate increased ease with feeding as evidenced by decreasing PPT#2 to 15 secs or less.   Baseline 18.16 secs   Time 8   Period Weeks   Status New     OT LONG TERM GOAL #3   Title Pt will verbalize understanding of ways to prevent future PD-related complications and will verbalize understanding of community resources.   Time 8   Period Weeks   Status New               Plan - 06/10/16 [id number]    Clinical Impression Statement Pt is progressing towards goals. He reports increased ease with getting up and down from the floor since exercising.    Rehab Potential Good   OT Frequency 2x / week   OT Duration 8 weeks   OT Treatment/Interventions Self-care/ADL training;Moist Heat;Fluidtherapy;DME and/or AE instruction;Patient/family education;Balance training;Therapeutic exercises;Ultrasound;Therapeutic exercise;Therapeutic activities;Cognitive remediation/compensation;Passive range of motion;Neuromuscular education;Cryotherapy;Parrafin;Energy conservation;Manual Therapy   Plan finish checking short term goals   OT Home Exercise Plan Education provided:  supine PWR, quadraped PWR, PWR! hands, (PWR! seated and standing from PT),    Consulted and Agree with Plan of Care Patient      Patient will benefit from skilled therapeutic intervention in order to improve the following deficits and impairments:  Abnormal gait, Decreased coordination, Impaired flexibility, Impaired UE functional use, Decreased knowledge of use of DME, Decreased balance, Decreased mobility, Decreased strength  Visit Diagnosis: Other symptoms and signs involving the nervous system  Abnormal posture  Tremor, unspecified  Other lack of coordination  Other abnormalities of gait and mobility    Problem List Patient Active Problem List   Diagnosis Date Noted  . Preventative health care 04/02/2015  . Constipation 04/02/2015  . 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 Dysphagia, neurologic 07/27/2014  . Parkinson disease ([place]) 07/27/2014  . Dystonia 07/10/2014  . Low back pain 11/21/2011  . TRANSAMINASES, SERUM, ELEVATED 07/06/2008  . DIARRHEA 05/23/2008  . LYMPH NODE-ENLARGED 07/28/2007  . Non-Hodgkin lymphoma ([place]) 03/23/2007    [name] [name] 06/10/2016, 12:55 PM  [facility] [address] [place], [place], [address] Phone: [phone]   Fax:  [phone]  Name: [name] [name] MRN: [id number] Date of Birth: 1951/01/21

## 2016-06-10 NOTE — Patient Instructions (Signed)
Keeping Thinking Skills Sharp: 1. Jigsaw puzzles 2. Card/board games 3. Talking on the phone/social events 4. [email or web address] 5. Online games 6. Word searches/crossword puzzles 7.  Logic puzzles 8. Aerobic exercise (stationary bike) 9. Eating balanced diet (fruits & veggies) 10. Drink water 11. Try something new--new recipe, hobby 12. Crafts 13. Do a variety of activities that are challenging 14. Add cognitive activities to walking/exercising (think of animal/food/city with each letter of the alphabet, counting backwards, thinking of as many vegetables as you can, etc.).--Only do this  If safe (no freezing/falls). 

## 2016-06-12 ENCOUNTER — Ambulatory Visit: Payer: BLUE CROSS/BLUE SHIELD | Admitting: Speech Pathology

## 2016-06-12 ENCOUNTER — Ambulatory Visit: Payer: BLUE CROSS/BLUE SHIELD | Admitting: Occupational Therapy

## 2016-06-12 DIAGNOSIS — R29818 Other symptoms and signs involving the nervous system: Secondary | ICD-10-CM

## 2016-06-12 DIAGNOSIS — R471 Dysarthria and anarthria: Secondary | ICD-10-CM

## 2016-06-12 DIAGNOSIS — R278 Other lack of coordination: Secondary | ICD-10-CM

## 2016-06-12 DIAGNOSIS — R2689 Other abnormalities of gait and mobility: Secondary | ICD-10-CM

## 2016-06-12 DIAGNOSIS — R293 Abnormal posture: Secondary | ICD-10-CM

## 2016-06-12 NOTE — Therapy (Signed)
[facility] [address] [place], [place], [address] Phone: [phone]   Fax:  [phone]  Occupational Therapy Treatment  Patient Details  Name: [name] [name] MRN: [id number] Date of Birth: 07-28-1951 Referring Provider: Dr. Carles Collet  Encounter Date: 06/12/2016      OT End of Session - 06/12/16 1159    Visit Number 8   Number of Visits 17   Date for OT Re-Evaluation 06/27/16   Authorization Type MCR   Authorization - Visit Number 8   Authorization - Number of Visits 10   OT Start Time 1107   OT Stop Time 1145   OT Time Calculation (min) 38 min   Activity Tolerance Patient tolerated treatment well   Behavior During Therapy [facility] for tasks assessed/performed      Past Medical History:  Diagnosis Date  . Cervical lymphadenopathy    right - followed my ENT  . Dystonia 1994   diagnosed in [place]  . Non Hodgkin's lymphoma ([place]) 2007   diffuse- 6 cycles of chemo with R CHOP Rituxan - last dose 10/08  . Parkinson disease ([place])    2015    Past Surgical History:  Procedure Laterality Date  . CHOLECYSTECTOMY, LAPAROSCOPIC    . EYE SURGERY     x2 as child  . PORT-A-CATH REMOVAL    . PORTACATH PLACEMENT    . SPLENECTOMY    . TONSILLECTOMY      There were no vitals filed for this visit.      Subjective Assessment - 06/12/16 1105    Patient Stated Goals To be able to write better   Currently in Pain? No/denies          Dynamic step and reach with trunk rotation to toss scarves to targets with large amplitude movements, min v.c. For larger amplitude movements, velocity  Ambulating while tossing scarf between hands and performing category generation for dual tasksing, only occasional min difficulty with tossing Placing grooved pegs in pegboard with right then left UE, removing with PWR! Step Pt agrees with plans for d/c next visit                    OT Education - 06/12/16 1157    Education provided Yes   Education Details ways to prevent future complications/ community resources, big movments with ADLs   Person(s) Educated Patient   Methods Explanation;Demonstration;Verbal cues;Handout   Comprehension Verbalized understanding;Returned demonstration;Verbal cues required          OT Short Term Goals - 06/12/16 1143      OT SHORT TERM GOAL #1   Title I with PD specific HEP.   Time 4   Status Achieved     OT SHORT TERM GOAL #2   Title Pt will verbalize understanding of adapted strategies to maximize safety and independence with ADLs/ IADLs.   Time 4   Period Weeks   Status Achieved     OT SHORT TERM GOAL #3   Title Pt will demonstrate increased [name] functional use as evidenced by increasing box/ blocks score by 4 blocks.   Time 4   Period Weeks   Status Achieved     OT SHORT TERM GOAL #4   Title Pt will demonstrate ability to write a sentence with 95% legibility and only minimal decrease in letter size.   Time 4   Period Weeks   Status Achieved     OT SHORT TERM GOAL #5   Period Weeks  Status Achieved           OT Long Term Goals - 06/12/16 1127      OT LONG TERM GOAL #1   Title Pt will demonstrate increased ease with dressing as evidenced by decreasing 3 button/ unbutton to 35 secs or less.   Time 8   Period Weeks   Status Achieved  23.03 secs     OT LONG TERM GOAL #2   Title Pt will demonstrate increased ease with feeding as evidenced by decreasing PPT#2 to 15 secs or less.   Time 8   Period Weeks   Status On-going     OT LONG TERM GOAL #3   Title Pt will verbalize understanding of ways to prevent future PD-related complications and will verbalize understanding of community resources.   Time 8   Period Weeks   Status Achieved               Plan - 06/12/16 1158    Clinical Impression Statement Pt demonstrates excellent overall progress towards goals. He reports he is moving better overall since exercising consistently    Rehab Potential Good   OT Frequency 2x / week   OT Duration 8 weeks   OT Treatment/Interventions Self-care/ADL training;Moist Heat;Fluidtherapy;DME and/or AE instruction;Patient/family education;Balance training;Therapeutic exercises;Ultrasound;Therapeutic exercise;Therapeutic activities;Cognitive remediation/compensation;Passive range of motion;Neuromuscular education;Cryotherapy;Parrafin;Energy conservation;Manual Therapy   Plan check long term goals, anticiapte d/c next visit   OT Home Exercise Plan Education provided:  supine PWR, quadraped PWR, PWR! hands, (PWR! seated and standing from PT),    Consulted and Agree with Plan of Care Patient      Patient will benefit from skilled therapeutic intervention in order to improve the following deficits and impairments:  Abnormal gait, Decreased coordination, Impaired flexibility, Impaired UE functional use, Decreased knowledge of use of DME, Decreased balance, Decreased mobility, Decreased strength  Visit Diagnosis: Other symptoms and signs involving the nervous system  Abnormal posture  Other lack of coordination  Other abnormalities of gait and mobility    Problem List Patient Active Problem List   Diagnosis Date Noted  . Preventative health care 04/02/2015  . Constipation 04/02/2015  . 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 Dysphagia, neurologic 07/27/2014  . Parkinson disease ([place]) 07/27/2014  . Dystonia 07/10/2014  . Low back pain 11/21/2011  . TRANSAMINASES, SERUM, ELEVATED 07/06/2008  . DIARRHEA 05/23/2008  . LYMPH NODE-ENLARGED 07/28/2007  . Non-Hodgkin lymphoma ([place]) 03/23/2007    Wajiha Versteeg 06/12/2016, 12:00 PM  [facility] [address] [place], [place], [address] Phone: [phone]   Fax:  [phone]  Name: [name] [name] MRN: [id number] Date of Birth: 25-Mar-1951

## 2016-06-12 NOTE — Patient Instructions (Addendum)
Performing Daily Activities with Big Movements  Pick at least one activity a day and perform with BIG, DELIBERATE movements/effort. This can make the activity easier and turn daily activities into exercise!  If you are standing during the activity, make sure to keep feet apart and stand with good/big/PWR! UP posture.  Examples:  Bathing - Wash/dry with long strokes  Brushing your teeth - Big, slow movements  Cutting food - Long deliberate cuts  Opening jar/bottle - Move as much as you can with each turn  Picking up a cup/bottle - Open hand up big and get object all the way in palm  Hanging up clothes/getting clothes down from closet - Reach with big effort  Putting away groceries/dishes - Reach with big effort  Wiping counter/table - Move in big, long strokes  Stirring while cooking - Exaggerate movement  Cleaning windows - Move in big, long strokes  Sweeping - Move arms in big, long strokes  Vacuuming - Push with big movement  Folding clothes - Exaggerate arm movements  Washing car - Move in big, long strokes  Raking - Move arms in big, long strokes  Changing light bulb - Move as much as you can with each turn  Using a screwdriver - Move as much as you can with each turn  Walking into a store/restaurant - Walk with big steps, swing arms if able  Standing up from a chair/recliner/sofa - Scoot forward, lean forward, and stand with big effort

## 2016-06-12 NOTE — Therapy (Signed)
[facility] [address] [place], [place], [address] Phone: [phone]   Fax:  [phone]  Speech Language Pathology Treatment  Patient Details  Name: NASIEM SIDENER MRN: [id number] Date of Birth: 08-Oct-1950 Referring Provider: Dr. Wells Guiles Tat  Encounter Date: 06/12/2016      End of Session - 06/12/16 1102    Visit Number 7   Number of Visits 17   Date for SLP Re-Evaluation 07/01/16   SLP Start Time 1016   SLP Stop Time  1100   SLP Time Calculation (min) 44 min   Activity Tolerance Patient tolerated treatment well      Past Medical History:  Diagnosis Date  . Cervical lymphadenopathy    right - followed my ENT  . Dystonia 1994   diagnosed in [place]  . Non Hodgkin's lymphoma ([place]) 2007   diffuse- 6 cycles of chemo with R CHOP Rituxan - last dose 10/08  . Parkinson disease ([place])    2015    Past Surgical History:  Procedure Laterality Date  . CHOLECYSTECTOMY, LAPAROSCOPIC    . EYE SURGERY     x2 as child  . PORT-A-CATH REMOVAL    . PORTACATH PLACEMENT    . SPLENECTOMY    . TONSILLECTOMY      There were no vitals filed for this visit.      Subjective Assessment - 06/12/16 1020    Subjective "I didn't sleep well last night"   Currently in Pain? No/denies               ADULT SLP TREATMENT - 06/12/16 1021      General Information   Behavior/Cognition Alert;Cooperative;Pleasant mood     Treatment Provided   Treatment provided Cognitive-Linquistic     Cognitive-Linquistic Treatment   Treatment focused on Dysarthria   Skilled Treatment Loud /a/ average 92dB recalibrated loud volume. Pt spoke in 16 minute conversation  with average of 70dB.  Walking outside noisy  environment, pt 100% audible with supervision cues.      Assessment / Recommendations / Plan   Plan Continue with current plan of care     Progression Toward Goals   Progression toward goals Progressing toward goals             SLP Short Term Goals - 06/12/16 1101      SLP SHORT TERM GOAL #1   Title Pt will achieve 90dB average on loud /a/ over 3 sessions   Status Achieved     SLP SHORT TERM GOAL #2   Title Pt will mainitain average of 70dB during structured speech tasks and sentence level utteraces with occasional minc cues over two sessions   Status Achieved     SLP SHORT TERM GOAL #3   Title Pt will follow HEP for dysphagia with occasional min A as tolerated   Time 2   Period Weeks   Status On-going          SLP Long Term Goals - 06/12/16 1102      SLP LONG TERM GOAL #1   Title Pt will maintain average of 70dB over 12 minute simple conversation with rare min A over three sessions   Baseline 06/10/16; 06/12/16   Time 4   Period Weeks   Status Revised     SLP LONG TERM GOAL #2   Title Pt will be audible over 15 minute conversation in noisy environment/outside of therpay room with min rare A x 2 sessions   Baseline  06/10/16; 06/12/16   Time 4   Period Weeks   Status On-going     SLP LONG TERM GOAL #3   Status On-going          Plan - 06/12/16 1059    Clinical Impression Statement Pt maintained volume outside of therapy with supervision cues and maintained volume average of 70dB during conversation. Recommend pt attend 1 more ST session to maximize carryover of compensations for dysartria and volume. Likely d/c next session if progress continues.    Speech Therapy Frequency 2x / week   Duration 1 week   Treatment/Interventions Functional tasks;Compensatory strategies;Multimodal communcation approach;SLP instruction and feedback;Patient/family education;Environmental controls   Potential to Achieve Goals Good   Consulted and Agree with Plan of Care Patient      Patient will benefit from skilled therapeutic intervention in order to improve the following deficits and impairments:   Dysarthria and anarthria    Problem List Patient Active Problem List   Diagnosis Date Noted   . Preventative health care 04/02/2015  . Constipation 04/02/2015  . 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 Dysphagia, neurologic 07/27/2014  . Parkinson disease ([place]) 07/27/2014  . Dystonia 07/10/2014  . Low back pain 11/21/2011  . TRANSAMINASES, SERUM, ELEVATED 07/06/2008  . DIARRHEA 05/23/2008  . LYMPH NODE-ENLARGED 07/28/2007  . Non-Hodgkin lymphoma ([place]) 03/23/2007    Torris House, [name] MS, CCC-SLP 06/12/2016, 11:03 AM  [facility] [address] [place], [place], [address] Phone: [phone]   Fax:  [phone]   Name: IOSIF SURACE MRN: [id number] Date of Birth: 26-Jun-1951

## 2016-06-17 ENCOUNTER — Ambulatory Visit: Payer: BLUE CROSS/BLUE SHIELD | Admitting: Speech Pathology

## 2016-06-17 ENCOUNTER — Ambulatory Visit: Payer: BLUE CROSS/BLUE SHIELD | Admitting: Occupational Therapy

## 2016-06-17 DIAGNOSIS — R293 Abnormal posture: Secondary | ICD-10-CM

## 2016-06-17 DIAGNOSIS — R278 Other lack of coordination: Secondary | ICD-10-CM

## 2016-06-17 DIAGNOSIS — R29818 Other symptoms and signs involving the nervous system: Secondary | ICD-10-CM

## 2016-06-17 DIAGNOSIS — R471 Dysarthria and anarthria: Secondary | ICD-10-CM

## 2016-06-17 DIAGNOSIS — R2689 Other abnormalities of gait and mobility: Secondary | ICD-10-CM

## 2016-06-17 DIAGNOSIS — R251 Tremor, unspecified: Secondary | ICD-10-CM

## 2016-06-17 NOTE — Patient Instructions (Signed)
  Keep doing "AH!" 5x a day 5/7 days a week  Keep practicing 10 min loud conversation  Big breaths during speech

## 2016-06-17 NOTE — Therapy (Signed)
[facility] [address] [place], [place], [address] Phone: [phone]   Fax:  [phone]  Speech Language Pathology Treatment  Patient Details  Name: [name] [name] MRN: [id number] Date of Birth: Mar 09, 1951 Referring Provider: Dr. Wells Guiles Tat  Encounter Date: 06/17/2016      End of Session - 06/17/16 0933    Visit Number 8   Number of Visits 17   Date for SLP Re-Evaluation 07/01/16   SLP Start Time 0845   SLP Stop Time  0933   SLP Time Calculation (min) 48 min      Past Medical History:  Diagnosis Date  . Cervical lymphadenopathy    right - followed my ENT  . Dystonia 1994   diagnosed in [place]  . Non Hodgkin's lymphoma ([place]) 2007   diffuse- 6 cycles of chemo with R CHOP Rituxan - last dose 10/08  . Parkinson disease ([place])    2015    Past Surgical History:  Procedure Laterality Date  . CHOLECYSTECTOMY, LAPAROSCOPIC    . EYE SURGERY     x2 as child  . PORT-A-CATH REMOVAL    . PORTACATH PLACEMENT    . SPLENECTOMY    . TONSILLECTOMY      There were no vitals filed for this visit.      Subjective Assessment - 06/17/16 0852    Subjective "Not great - my wife fell"   Currently in Pain? No/denies               ADULT SLP TREATMENT - 06/17/16 0853      General Information   Behavior/Cognition Alert;Cooperative;Pleasant mood     Treatment Provided   Treatment provided Cognitive-Linquistic     Cognitive-Linquistic Treatment   Treatment focused on Dysarthria   Skilled Treatment Loud /a/ completed with average 90dB with mod I. Moderately complex convesation. 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       Assessment / Recommendations / Plan   Plan Discharge SLP treatment due to (comment)     Progression Toward Goals   Progression toward goals Goals met, education completed, patient discharged from [facility] - 06/17/16 0926    Education provided Yes   Education Details Continue loud /a/, practice being loud, be aware of breath support during conversation   Person(s) Educated Patient   Methods Explanation;Demonstration;Verbal cues;Handout   Comprehension Verbalized understanding;Returned demonstration    SPEECH THERAPY DISCHARGE SUMMARY  Visits from Start of Care: 8  Current functional level related to goals / functional outcomes: See goals below   Remaining deficits: Hypokinetic dysarthria  Education / Equipment: Compensations for dysarthria, breath support for speech, home program Plan: Patient agrees to discharge.  Patient goals were met. Patient is being discharged due to meeting the stated rehab goals.  ?????            SLP Short Term Goals - 06/17/16 0932      SLP SHORT TERM GOAL #1   Title Pt will achieve 90dB average on loud /a/ over 3 sessions   Status Achieved     SLP SHORT TERM GOAL #2   Title Pt will mainitain average of 70dB during structured speech tasks and sentence level utteraces with occasional minc cues over two sessions   Status Achieved     SLP SHORT TERM GOAL #3   Title Pt will follow HEP for dysphagia with occasional min A as  tolerated   Time 2   Period Weeks   Status deferred          SLP Long Term Goals - July 17, 2016 0932      SLP LONG TERM GOAL #1   Title Pt will maintain average of 70dB over 12 minute simple conversation with rare min A over three sessions   Baseline 06/10/16; 06/12/16   Time 4   Period Weeks   Status Achieved     SLP LONG TERM GOAL #2   Title Pt will be audible over 15 minute conversation in noisy environment/outside of therpay room with min rare A x 2 sessions   Baseline 06/10/16; 06/12/16; July 17, 2016   Time 4   Period Weeks   Status Achieved     SLP LONG TERM GOAL #3   Status           Plan - Jul 17, 2016 0929    Clinical Impression Statement Pt has met all goals, and is reporting success carrying over volume outside of clinic.  D/C ST  .   Speech Therapy Frequency 2x / week   Duration 1 week   Treatment/Interventions Functional tasks;Compensatory strategies;Multimodal communcation approach;SLP instruction and feedback;Patient/family education;Environmental controls   Potential to Achieve Goals Good   Consulted and Agree with Plan of Care Patient      Patient will benefit from skilled therapeutic intervention in order to improve the following deficits and impairments:   Dysarthria and anarthria      G-Codes - 2016-07-17 0934    Functional Assessment Tool Used noms   Functional Limitations Motor speech   Motor Speech Goal Status [phone]) At least 1 percent but less than 20 percent impaired, limited or restricted   Motor Speech Goal Status ([id number]) At least 1 percent but less than 20 percent impaired, limited or restricted      Problem List Patient Active Problem List   Diagnosis Date Noted  . Preventative health care 04/02/2015  . Constipation 04/02/2015  . 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 Dysphagia, neurologic 07/27/2014  . Parkinson disease ([place]) 07/27/2014  . Dystonia 07/10/2014  . Low back pain 11/21/2011  . TRANSAMINASES, SERUM, ELEVATED 07/06/2008  . DIARRHEA 05/23/2008  . LYMPH NODE-ENLARGED 07/28/2007  . Non-Hodgkin lymphoma ([place]) 03/23/2007    Gavina Dildine, [name] MS, CCC-SLP Jul 17, 2016, 9:35 AM  [facility] [address] [place], [place], [address] Phone: [phone]   Fax:  [phone]   Name: [name] [name] MRN: [id number] Date of Birth: 06/03/1951

## 2016-06-17 NOTE — Therapy (Signed)
[facility] [address] [place], [place], [address] Phone: [phone]   Fax:  [phone]  Occupational Therapy Treatment  Patient Details  Name: [name] [name] MRN: [id number] Date of Birth: May 18, 1951 Referring Provider: Dr. Carles Collet  Encounter Date: 06/17/2016      OT End of Session - 06/17/16 1006    Visit Number 8   Number of Visits 17   Date for OT Re-Evaluation 06/27/16   Authorization - Visit Number 9   Authorization - Number of Visits 10   OT Start Time 0941  1 unit only, d/c visit, pt requests to leave early   OT Stop Time 0950   OT Time Calculation (min) 9 min   Behavior During Therapy [facility] for tasks assessed/performed      Past Medical History:  Diagnosis Date  . Cervical lymphadenopathy    right - followed my ENT  . Dystonia 1994   diagnosed in [place]  . Non Hodgkin's lymphoma ([place]) 2007   diffuse- 6 cycles of chemo with R CHOP Rituxan - last dose 10/08  . Parkinson disease ([place])    2015    Past Surgical History:  Procedure Laterality Date  . CHOLECYSTECTOMY, LAPAROSCOPIC    . EYE SURGERY     x2 as child  . PORT-A-CATH REMOVAL    . PORTACATH PLACEMENT    . SPLENECTOMY    . TONSILLECTOMY      There were no vitals filed for this visit.      Subjective Assessment - 06/17/16 0941    Subjective  Pt reports he ready for d/c   Patient Stated Goals To be able to write better   Currently in Pain? No/denies         Treatment: PWR! Hands for PWR! Up, and PWR! Rock, followed by therapist checking remaining goals. Therapist reviewed progress with pt and discussed plans to schedule a PD screen in 6 mons. Pt is in agreement.                       OT Short Term Goals - 06/17/16 0945      OT SHORT TERM GOAL #1   Title I with PD specific HEP.   Time 4   Status Achieved     OT SHORT TERM GOAL #2   Title Pt will verbalize understanding of adapted strategies to maximize  safety and independence with ADLs/ IADLs.   Time 4   Period Weeks   Status Achieved     OT SHORT TERM GOAL #3   Title Pt will demonstrate increased [name] functional use as evidenced by increasing box/ blocks score by 4 blocks.   Time 4   Period Weeks   Status Achieved     OT SHORT TERM GOAL #4   Title Pt will demonstrate ability to write a sentence with 95% legibility and only minimal decrease in letter size.   Time 4   Period Weeks   Status Achieved     OT SHORT TERM GOAL #5   Title Pt will verbalize understanding of strategies to keep thinking skills sharp.   Period Weeks   Status Achieved           OT Long Term Goals - 06/17/16 0945      OT LONG TERM GOAL #1   Title Pt will demonstrate increased ease with dressing as evidenced by decreasing 3 button/ unbutton to 35 secs or less.   Time 8  Period Weeks   Status Achieved  23.03 secs     OT LONG TERM GOAL #2   Title Pt will demonstrate increased ease with feeding as evidenced by decreasing PPT#2 to 15 secs or less.   Baseline 18.16 secs   Time 8   Period Weeks   Status Achieved  12.41 secs     OT LONG TERM GOAL #3   Title Pt will verbalize understanding of ways to prevent future PD-related complications and will verbalize understanding of community resources.   Time 8   Period Weeks   Status Achieved               Plan - 07-14-2016 1005    Clinical Impression Statement Pt has made excellent overall progress and achieved all long and short term goals. Pt agrees with d/c.   Rehab Potential Good   OT Frequency 2x / week   OT Duration 8 weeks   OT Treatment/Interventions Self-care/ADL training;Moist Heat;Fluidtherapy;DME and/or AE instruction;Patient/family education;Balance training;Therapeutic exercises;Ultrasound;Therapeutic exercise;Therapeutic activities;Cognitive remediation/compensation;Passive range of motion;Neuromuscular education;Cryotherapy;Parrafin;Energy conservation;Manual Therapy   Plan d/c  OT, pt agrees with plans for a Parkinson's screen in 6 mons   OT Home Exercise Plan Education provided:  supine PWR, quadraped PWR, PWR! hands, (PWR! seated and standing from PT),    Consulted and Agree with Plan of Care Patient      Patient will benefit from skilled therapeutic intervention in order to improve the following deficits and impairments:  Abnormal gait, Decreased coordination, Impaired flexibility, Impaired UE functional use, Decreased knowledge of use of DME, Decreased balance, Decreased mobility, Decreased strength  Visit Diagnosis: Other symptoms and signs involving the nervous system  Abnormal posture  Other lack of coordination  Other abnormalities of gait and mobility  Tremor, unspecified      G-Codes - Jul 14, 2016 1012    Functional Assessment Tool Used 3 button / unbutton: 23.03 PPT# 2: 12.41secs, Pt is able to write a sentence with greater than 95% legibility, and mild micrographia   Functional Limitation Self care   Self Care Goal Status ([id number]) At least 1 percent but less than 20 percent impaired, limited or restricted   Self Care Discharge Status [phone]) At least 1 percent but less than 20 percent impaired, limited or restricted    OCCUPATIONAL THERAPY DISCHARGE SUMMARY    Current functional level related to goals / functional outcomes: Pt made excellent overall progress. He achieved all goals. Pt reports he can see the benefits of exercises.   Remaining deficits: Tremor, bradykinesia, rigidity, abnormal posture, decreased balance, decreased coordination,    Education / Equipment: Pt was educated regarding: PD specific HEP, adapted strategies for ADLs/IADLS,  community resources, ways to prevent future complications and keep thinking skills sharp. Pt verbalized understanding of all education.  Plan: Patient agrees to discharge.  Patient goals were met. Patient is being discharged due to meeting the stated rehab goals.  ?????     Problem List Patient  Active Problem List   Diagnosis Date Noted  . Preventative health care 04/02/2015  . Constipation 04/02/2015  . 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 Dysphagia, neurologic 07/27/2014  . Parkinson disease ([employer]) 07/27/2014  . Dystonia 07/10/2014  . Low back pain 11/21/2011  . TRANSAMINASES, SERUM, ELEVATED 07/06/2008  . DIARRHEA 05/23/2008  . LYMPH NODE-ENLARGED 07/28/2007  . Non-Hodgkin lymphoma ([place]) 03/23/2007    RINE,KATHRYN 2016-07-14, 10:16 AM Theone Murdoch, OTR/L [phone] Phone: [phone] 10:16 AM 07-14-2016 [place]  [facility] [address] [place], [place], [address] Phone: [phone]   Fax:  [phone]  Name: [name] [name] MRN: [id number] Date of Birth: 10/24/1950

## 2016-06-19 ENCOUNTER — Encounter: Payer: BLUE CROSS/BLUE SHIELD | Admitting: Occupational Therapy

## 2016-06-20 ENCOUNTER — Ambulatory Visit (INDEPENDENT_AMBULATORY_CARE_PROVIDER_SITE_OTHER): Payer: BLUE CROSS/BLUE SHIELD | Admitting: Family Medicine

## 2016-06-20 ENCOUNTER — Encounter: Payer: Self-pay | Admitting: Family Medicine

## 2016-06-20 VITALS — BP 110/60 | HR 78 | Temp 97.8°F | Ht 66.0 in | Wt 185.4 lb

## 2016-06-20 DIAGNOSIS — Z131 Encounter for screening for diabetes mellitus: Secondary | ICD-10-CM | POA: Diagnosis not present

## 2016-06-20 DIAGNOSIS — IMO0001 Reserved for inherently not codable concepts without codable children: Secondary | ICD-10-CM

## 2016-06-20 DIAGNOSIS — Z0000 Encounter for general adult medical examination without abnormal findings: Secondary | ICD-10-CM | POA: Diagnosis not present

## 2016-06-20 DIAGNOSIS — Z9189 Other specified personal risk factors, not elsewhere classified: Secondary | ICD-10-CM | POA: Diagnosis not present

## 2016-06-20 DIAGNOSIS — E663 Overweight: Secondary | ICD-10-CM | POA: Diagnosis not present

## 2016-06-20 DIAGNOSIS — Z23 Encounter for immunization: Secondary | ICD-10-CM | POA: Diagnosis not present

## 2016-06-20 LAB — COMPREHENSIVE METABOLIC PANEL
ALBUMIN: 4 g/dL (ref 3.5–5.2)
ALK PHOS: 51 U/L (ref 39–117)
ALT: 34 U/L (ref 0–53)
AST: 24 U/L (ref 0–37)
BILIRUBIN TOTAL: 0.7 mg/dL (ref 0.2–1.2)
BUN: 13 mg/dL (ref 6–23)
CALCIUM: 8.9 mg/dL (ref 8.4–10.5)
CO2: 31 mEq/L (ref 19–32)
CREATININE: 1.05 mg/dL (ref 0.40–1.50)
Chloride: 104 mEq/L (ref 96–112)
GFR: 75.35 mL/min (ref >60.00)
Glucose, Bld: 94 mg/dL (ref 70–99)
Potassium: 3.8 mEq/L (ref 3.5–5.1)
Sodium: 141 mEq/L (ref 135–145)
Total Protein: 7 g/dL (ref 6.0–8.3)

## 2016-06-20 LAB — LIPID PANEL
CHOLESTEROL: 215 mg/dL — AB (ref 0–200)
HDL: 50.8 mg/dL (ref >39.00)
LDL CALC: 136 mg/dL — AB (ref 0–99)
NONHDL: 164.06
Total CHOL/HDL Ratio: 4
Triglycerides: 140 mg/dL (ref 0.0–149.0)
VLDL: 28 mg/dL (ref 0.0–40.0)

## 2016-06-20 LAB — HEMOGLOBIN A1C: HEMOGLOBIN A1C: 5.8 % (ref 4.6–6.5)

## 2016-06-20 NOTE — Progress Notes (Signed)
Pre visit review using our clinic review tool, if applicable. No additional management support is needed unless otherwise documented below in the visit note. 

## 2016-06-20 NOTE — Progress Notes (Addendum)
Chief Complaint  Patient presents with  . Annual Exam    fasting    Well Male [name] [name] is here for a complete physical.   His last physical was >1 year(s) ago.  Current diet: in general, a "healthy" diet  Could be better. Current exercise: Working with therapy and back on his exercise; riding his bike every other day Weight trend: stable Does pt snore? Yes- no evidence of sleep apnea Daytime fatigue? No. Seat belt? Yes.   Concerns: None  Past Medical History:  Diagnosis Date  . Cervical lymphadenopathy    right - followed my ENT  . Dystonia 1994   diagnosed in [place]  . Non Hodgkin's lymphoma ([place]) 2007   diffuse- 6 cycles of chemo with R CHOP Rituxan - last dose 10/08  . Parkinson disease ([place])    2015    Past Surgical History:  Procedure Laterality Date  . CHOLECYSTECTOMY, LAPAROSCOPIC    . EYE SURGERY     x2 as child  . PORT-A-CATH REMOVAL    . PORTACATH PLACEMENT    . SPLENECTOMY    . TONSILLECTOMY     Medications  Current Outpatient Prescriptions on File Prior to Visit  Medication Sig Dispense Refill  . aspirin 81 MG chewable tablet Chew 81 mg by mouth daily.    . Cholecalciferol (VITAMIN D3) 3000 UNITS TABS Take 1,000 Units by mouth.     . cyanocobalamin 100 MCG tablet Take 1,000 mcg by mouth daily.     . Melatonin 3 MG TABS Take by mouth at bedtime.    . Pramipexole Dihydrochloride 1.5 MG TB24 TAKE 1 TABLET DAILY 90 tablet 3   Allergies No Known Allergies Family History Family History  Problem Relation Age of Onset  . Heart disease Mother   . Cancer Mother     lung  . Heart disease Father     Review of Systems: Constitutional:  no unexpected change in weight, no fevers or chills Eye:  no recent significant change in vision Ear/Nose/Mouth/Throat:  Ears:  no tinnitus or hearing loss Nose/Mouth/Throat:  no complaints of nasal congestion or bleeding, no sore throat and oral sores; +chronic difficulty swallowing Cardiovascular:  no chest pain,  no palpitations Respiratory:  no cough and no shortness of breath Gastrointestinal:  no abdominal pain, no change in bowel habits, no nausea, vomiting, diarrhea, or constipation and no black or bloody stool GU:  Male: negative for dysuria, frequency, and incontinence and negative for prostate symptoms Musculoskeletal/Extremities:  no pain, redness, or swelling of the joints Integumentary (Skin/Breast):  no abnormal skin lesions reported Neurologic:  no headaches, +chronic tingling in toes Endocrine:  weight changes, masses in the neck, heat/cold intolerance, bowel or skin changes, or cardiovascular system symptoms Hematologic/Lymphatic:  no abnormal bleeding, no HIV risk factors, no night sweats, no swollen nodes, no weight loss  Exam BP 110/60 (BP Location: Left Arm, Patient Position: Sitting, Cuff Size: Normal)   Pulse 78   Temp 97.8 F (36.6 C) (Oral)   Ht 5\' 6"  (1.676 m)   Wt 185 lb 6.4 oz (84.1 kg)   SpO2 97%   BMI 29.92 kg/m  General:  well developed, well nourished, in no apparent distress Skin:  no significant moles, warts, or growths Head:  no masses, lesions, or tenderness Eyes:  pupils equal and round, sclera anicteric without injection Ears:  canals without lesions, TMs shiny without retraction, no obvious effusion, no erythema Nose:  nares patent, septum midline, mucosa normal Throat/Pharynx:  lips and gingiva without lesion; tongue and uvula midline; non-inflamed pharynx; no exudates or postnasal drainage Neck: neck supple without adenopathy, thyromegaly, or masses Lungs:  clear to auscultation, breath sounds equal bilaterally, no respiratory distress Cardio:  regular rate and rhythm without murmurs, heart sounds without clicks or rubs Abdomen:  abdomen soft, nontender; bowel sounds normal; no masses or organomegaly Genital (male): Declined Rectal: Deferred Musculoskeletal:  symmetrical muscle groups noted without atrophy or deformity Extremities:  no clubbing,  cyanosis, or edema, no deformities, no skin discoloration Neuro:  +resting tremor in hands and jaw; deep tendon reflexes normal and symmetric Psych: well oriented with normal range of affect and appropriate judgment/insight  Assessment and Plan  Well adult  10 year risk of MI or stroke 7.5% or greater - Plan: Lipid panel  Screening for diabetes mellitus - Plan: Hemoglobin A1c  Overweight (BMI 25.0-29.9) - Plan: Comprehensive metabolic panel   Well 65 y.o. male. Counseled on diet and exercise. OK to stay on ASA. Will recheck cholesterol, if still in risk category, gave option of returning to start medication vs returning in 90 days to recheck cholesterol and evaluate situation. He opted for the latter, we will contact him pending hte results of the above. Otherwise I will see him when he turns 72. He will receive a PCV13 when he turns 69.  Return in 1 week to receive PCV23. Immunizations, labs, and further orders as above. The patient voiced understanding and agreement to the plan.  Rosalie, DO 06/20/16 7:52 AM

## 2016-06-20 NOTE — Addendum Note (Signed)
Addended by: Harl Bowie on: 06/20/2016 08:24 AM   Modules accepted: Orders

## 2016-06-20 NOTE — Patient Instructions (Addendum)
Heart-Healthy Eating Plan Many factors influence your heart health, including eating and exercise habits. Heart (coronary) risk increases with abnormal blood fat (lipid) levels. Heart-healthy meal planning includes limiting unhealthy fats, increasing healthy fats, and making other small dietary changes. This includes maintaining a healthy body weight to help keep lipid levels within a normal range. WHAT TYPES OF FAT SHOULD I CHOOSE?  Choose healthy fats more often. Choose monounsaturated and polyunsaturated fats, such as olive oil and canola oil, flaxseeds, walnuts, almonds, and seeds.  Eat more omega-3 fats. Good choices include salmon, mackerel, sardines, tuna, flaxseed oil, and ground flaxseeds. Aim to eat fish at least two times each week.  Limit saturated fats. Saturated fats are primarily found in animal products, such as meats, butter, and cream. Plant sources of saturated fats include palm oil, palm kernel oil, and coconut oil.  Avoid foods with partially hydrogenated oils in them. These contain trans fats. Examples of foods that contain trans fats are stick margarine, some tub margarines, cookies, crackers, and other baked goods. WHAT GENERAL GUIDELINES DO I NEED TO FOLLOW?  Check food labels carefully to identify foods with trans fats or high amounts of saturated fat.  Fill one half of your plate with vegetables and green salads. Eat 4-5 servings of vegetables per day. A serving of vegetables equals 1 cup of raw leafy vegetables,  cup of raw or cooked cut-up vegetables, or  cup of vegetable juice.  Fill one fourth of your plate with whole grains. Look for the word "whole" as the first word in the ingredient list.  Fill one fourth of your plate with lean protein foods.  Eat 4-5 servings of fruit per day. A serving of fruit equals one medium whole fruit,  cup of dried fruit,  cup of fresh, frozen, or canned fruit, or  cup of 100% fruit juice.  Eat more foods that contain soluble  fiber. Examples of foods that contain this type of fiber are apples, broccoli, carrots, beans, peas, and barley. Aim to get 20-30 g of fiber per day.  Eat more home-cooked food and less restaurant, buffet, and fast food.  Limit or avoid alcohol.  Limit foods that are high in starch and sugar.  Avoid fried foods.  Cook foods by using methods other than frying. Baking, boiling, grilling, and broiling are all great options. Other fat-reducing suggestions include:  Removing the skin from poultry.  Removing all visible fats from meats.  Skimming the fat off of stews, soups, and gravies before serving them.  Steaming vegetables in water or broth.  Lose weight if you are overweight. Losing just 5-10% of your initial body weight can help your overall health and prevent diseases such as diabetes and heart disease.  Increase your consumption of nuts, legumes, and seeds to 4-5 servings per week. One serving of dried beans or legumes equals  cup after being cooked, one serving of nuts equals 1 ounces, and one serving of seeds equals  ounce or 1 tablespoon.  You may need to monitor your salt (sodium) intake, especially if you have high blood pressure. Talk with your health care provider or dietitian to get more information about reducing sodium. WHAT FOODS CAN I EAT? Grains Breads, including [place], white, pita, wheat, raisin, rye, oatmeal, and [place]. Tortillas that are neither fried nor made with lard or trans fat. Low-fat rolls, including hotdog and hamburger buns and English muffins. Biscuits. Muffins. Waffles. Pancakes. Light popcorn. Whole-grain cereals. Flatbread. Melba toast. Pretzels. Breadsticks. Rusks. Low-fat snacks and  crackers, including oyster, saltine, matzo, graham, animal, and rye. Rice and pasta, including brown rice and those that are made with whole wheat. Vegetables All vegetables. Fruits All fruits, but limit coconut. Meats and Other Protein Sources Lean, well-trimmed  beef, veal, pork, and lamb. Chicken and [place] without skin. All fish and shellfish. Wild duck, rabbit, pheasant, and venison. Egg whites or low-cholesterol egg substitutes. Dried beans, peas, lentils, and tofu.Seeds and most nuts. Dairy Low-fat or nonfat cheeses, including ricotta, string, and mozzarella. Skim or 1% milk that is liquid, powdered, or evaporated. Buttermilk that is made with low-fat milk. Nonfat or low-fat yogurt. Beverages Mineral water. Diet carbonated beverages. Sweets and Desserts Sherbets and fruit ices. Honey, jam, marmalade, jelly, and syrups. Meringues and gelatins. Pure sugar candy, such as hard candy, jelly beans, gumdrops, mints, marshmallows, and small amounts of dark chocolate. [employer] Eat all sweets and desserts in moderation. Fats and Oils Nonhydrogenated (trans-free) margarines. Vegetable oils, including soybean, sesame, sunflower, olive, peanut, safflower, corn, canola, and cottonseed. Salad dressings or mayonnaise that are made with a vegetable oil. Limit added fats and oils that you use for cooking, baking, salads, and as spreads. Other Cocoa powder. Coffee and tea. All seasonings and condiments. The items listed above may not be a complete list of recommended foods or beverages. Contact your dietitian for more options. WHAT FOODS ARE NOT RECOMMENDED? Grains Breads that are made with saturated or trans fats, oils, or whole milk. Croissants. Butter rolls. Cheese breads. Sweet rolls. Donuts. Buttered popcorn. Chow mein noodles. High-fat crackers, such as cheese or butter crackers. Meats and Other Protein Sources Fatty meats, such as hotdogs, short ribs, sausage, spareribs, bacon, ribeye roast or steak, and mutton. High-fat deli meats, such as salami and bologna. Caviar. Domestic duck and goose. Organ meats, such as kidney, liver, sweetbreads, brains, gizzard, chitterlings, and heart. Dairy Cream, sour cream, cream cheese, and creamed cottage cheese. Whole  milk cheeses, including blue (bleu), Monterey Jack, [place], [place], American, [place], Swiss, [place], [place], and [place]. Whole or 2% milk that is liquid, evaporated, or condensed. Whole buttermilk. Cream sauce or high-fat cheese sauce. Yogurt that is made from whole milk. Beverages Regular sodas and drinks with added sugar. Sweets and Desserts Frosting. Pudding. Cookies. Cakes other than angel food cake. Candy that has milk chocolate or white chocolate, hydrogenated fat, butter, coconut, or unknown ingredients. Buttered syrups. Full-fat ice cream or ice cream drinks. Fats and Oils Gravy that has suet, meat fat, or shortening. Cocoa butter, hydrogenated oils, palm oil, coconut oil, palm kernel oil. These can often be found in baked products, candy, fried foods, nondairy creamers, and whipped toppings. Solid fats and shortenings, including bacon fat, salt pork, lard, and butter. Nondairy cream substitutes, such as coffee creamers and sour cream substitutes. Salad dressings that are made of unknown oils, cheese, or sour cream. The items listed above may not be a complete list of foods and beverages to avoid. Contact your dietitian for more information.   This information is not intended to replace advice given to you by your health care provider. Make sure you discuss any questions you have with your health care provider.   Document Released: 06/17/2008 Document Revised: 09/29/2014 Document Reviewed: 03/02/2014 Elsevier Interactive Patient Education [employer].

## 2016-06-24 ENCOUNTER — Encounter: Payer: BLUE CROSS/BLUE SHIELD | Admitting: Occupational Therapy

## 2016-06-26 ENCOUNTER — Encounter: Payer: BLUE CROSS/BLUE SHIELD | Admitting: Occupational Therapy

## 2016-07-01 ENCOUNTER — Ambulatory Visit (INDEPENDENT_AMBULATORY_CARE_PROVIDER_SITE_OTHER): Payer: Medicare Other | Admitting: Behavioral Health

## 2016-07-01 DIAGNOSIS — Z23 Encounter for immunization: Secondary | ICD-10-CM | POA: Diagnosis not present

## 2016-07-01 NOTE — Progress Notes (Signed)
Pre visit review using our clinic review tool, if applicable. No additional management support is needed unless otherwise documented below in the visit note.  Patient presents in clinic today for pneumococcal vaccination (23). IM given in the Left Deltoid. Patient tolerated injection well.

## 2016-07-15 NOTE — Progress Notes (Signed)
[name] [name] was seen today in the movement disorders clinic for neurologic consultation at the request of [name], DO.  The consultation is for the evaluation of tremor.  This patient is accompanied in the office by his spouse who supplements the history.    The pt was dx with dystonia in [place] in 1994.  He presented to the speech pathologist and was dx with spasmodic dysphonia.  He was then referred to the neurologist who confirmed the diagnosis and did and MRI of the brain that was normal.  He had botox in to the vocal cords and that was helpful.  He would lose the voice for 6 weeks after the injections and then would get the voice back for 6 weeks.  He would go back to [place] twice per year for the injections.  He hasn't had the injections for the last 2 years as he quit working.  The spasmodic dysphonia is worse on the phone.  Last ST was 1995.  Having some trouble with swallowing.  Had developed some movements in the face and tried botox there but didn't really help there.  Has had some neck extension as well with the dystonic movements.  Wife states that these sx's have been better since retirement; he is under less stress.  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  The patient reports that [name] tremor started in the thumb in may and has slowly gotten worse and 3 weeks ago, he noted just a little in the [name].  He notes the tremor most at rest.  09/26/14 update:  The patient returns today for follow-up.  The patient has a history of Meige's syndrome, but presented last time with parkinsonism, but did not meet all the criteria for idiopathic Parkinson's disease.  Nonetheless, the patient wanted to start on medication.  We started him on Mirapex ER and he has worked his way up to 1.5 mg daily.  The patient reports that he is about 50% better.  He states that it has helped tremor but it isn't gone.  His wife thinks that mirapex is disrupting his sleep.   He has constipation and  thought that it was the mirapex.  His balance has been good.   He had an MRI of the brain since last visit and that was unremarkable.  Because of significant dysphagia from his Meige syndrome he had a modified barium swallow on 08/07/2014 and that revealed mild oral dysphagia, moderate pharyngeal phase dysphagia and a regular diet with thin liquids was recommended.   Has a strange sensation under the bottom of the foot like something is under the ball of the foot.  Has had paresthesias off and on for a long time.  Did get chemo with nonHodgkins and been on B6 since.  01/02/15 update:  The patient is following up today, accompanied by his wife is supplements the history.  The patient is on Mirapex ER, 1.5 mg daily.  Overall, the patient states that he is doing well.  No falls.  No hallucinations.  He continues to have issues with dysphagia, but that is primarily from his long history of Meige syndrome.  He continues to not need much sleep at night but it is not transitioning into daytime sleepiness.    05/03/15 update:  The patient returns today for follow-up, accompanied by his wife who supplements the history.  The patient is on pramipexole ER, 1.5 mg daily. He is doing well on it. He states  that he was exercising but he "fell off the wagon."   He was referred last visit for physical and speech therapy. He states that he never got a call in that regard.   He does have a history of Meige syndrome, which is the primarily etiology for the dysphagia and some of the speech issues.  Last visit, I did ask him to add B12 supplementation, as his B12 was on the low end of normal.  He has been taking his B12 supplement faithfully.  He already he has peripheral neuropathy because of his history of chemotherapy.   Pt states that he is sleeping better than last visit.    09/03/15 update:  The patient follows up today, accompanied by his wife who supplements the history.  He remains on pramipexole ER, 1.5 mg daily.  He has  completed physical therapy and remains in speech therapy.  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  He is doing his PT at home.  Hasn't been faithful with CV exercise.  Has just started melatonin for insomnia and thinks it is helping.   Overall, the patient has been stable.  He has not experienced any falls.  No hallucinations.  No lightheadedness or near syncope.  He has not choked on any of his foods.  His wife is noting some short term memory issues - he forgot to shut garage door and one time forgot to take mirapex.  Pt noted that he had bad RLS when he did that.    01/08/16 update:  The patient follows up today, accompanied by his wife who supplements the history.  He is on Mirapex ER, 1.5 g daily.  He denies compulsive behaviors.  No falls since last visit.  He does have tremor, but not bothersome enough to change his medication.  No hallucinations.  No lightheadedness or near syncope.  He did have a modified barium swallow on 10/18/2015.  There is mild oral and moderate pharyngeal dysphagia.  This was worse with solids than liquids.  The patient felt that this was actually better than his last study.  There is no dietary changes recommended interventions recommended that he eat slowly, take small bites and drink plenty of liquids after his small solid bites.  04/11/16 update:  The patient follows up today, accompanied by his wife who supplements the history.  He is on Mirapex ER, 1.5 g daily.  He denies compulsive behaviors.  No falls since last visit.  He does have tremor, but not bothersome enough to change his medication.  No hallucinations.  No lightheadedness or near syncope.  He did have his therapy screens and while physical therapy was not recommended, occupational and speech therapy were and he will be starting those soon.  Not exercising.    07/17/16 update:  The patient is seen today, accompanied by his wife who supplements the  history.  He is on pramipexole ER, 1.5 mg daily.  He reports that he is doing well.  He continues to have some tremor, but does not want to change his medications.  He denies any falls.  He denies hallucinations.  He denies compulsive behaviors.  He denies lightheadedness or near syncope.  He is now faithfully exercising for 2 months.  His doctor told him that he had to clean up his diet and start exercising and he did this.  He is physically feeling better.   ALLERGIES:  No Known Allergies  CURRENT MEDICATIONS:  Outpatient Encounter Prescriptions as of 07/17/2016  Medication Sig  . aspirin 81 MG chewable tablet Chew 81 mg by mouth daily.  . Cholecalciferol (VITAMIN D3) 3000 UNITS TABS Take 1,000 Units by mouth.   . cyanocobalamin 100 MCG tablet Take 1,000 mcg by mouth daily.   . Melatonin 3 MG TABS Take by mouth at bedtime.  . Pramipexole Dihydrochloride 1.5 MG TB24 TAKE 1 TABLET DAILY   No facility-administered encounter medications on file as of 07/17/2016.     PAST MEDICAL HISTORY:   Past Medical History:  Diagnosis Date  . Cervical lymphadenopathy    right - followed my ENT  . Dystonia 1994   diagnosed in [place]  . Non Hodgkin's lymphoma ([place]) 2007   diffuse- 6 cycles of chemo with R CHOP Rituxan - last dose 10/08  . Parkinson disease ([place])    2015    PAST SURGICAL HISTORY:   Past Surgical History:  Procedure Laterality Date  . CHOLECYSTECTOMY, LAPAROSCOPIC    . EYE SURGERY     x2 as child  . PORT-A-CATH REMOVAL    . PORTACATH PLACEMENT    . SPLENECTOMY    . TONSILLECTOMY      SOCIAL HISTORY:   Social History   Social History  . Marital status: Married    Spouse name: N/A  . Number of children: N/A  . Years of education: N/A   Occupational History  . Not on file.   Social History Main Topics  . Smoking status: Never Smoker  . Smokeless tobacco: Never Used  . Alcohol use No  . Drug use: No  . Sexual activity: Not on file   Other Topics Concern  . Not  on file   Social History Narrative  . No narrative on file    FAMILY HISTORY:   Family Status  Relation Status  . Mother Deceased   heart disease, lung cancer  . Father Deceased   heart attack  . Brother Deceased   colon cancer  . Sister Alive   unknown  . Son Alive   anemia  . Son Alive   healthy  . Daughter Alive   healthy  . Son Deceased   debrancher's enzyme disease  . Son Deceased   debrancher's enzyme disease    ROS:  A complete 10 system review of systems was obtained and was unremarkable apart from what is mentioned above.  PHYSICAL EXAMINATION:    VITALS:   Vitals:   07/17/16 1038  BP: 116/60  Pulse: 70  Weight: 179 lb (81.2 kg)  Height: 5\' 6"  (1.676 m)    GEN:  The patient appears stated age and is in NAD. HEENT:  Normocephalic, atraumatic.  The mucous membranes are moist. The superficial temporal arteries are without ropiness or tenderness. CV:  RRR Lungs:  CTAB Neck/HEME:  There are no carotid bruits bilaterally.  Neurological examination:  Orientation: The patient is alert and oriented x3.  Cranial nerves: There is good facial symmetry.  Slight facial hypomimia.  Mild oromandibular dystonia. Pupils are equal round and reactive to light bilaterally. Fundoscopic exam reveals clear margins bilaterally. There is L exotropia (chronic). The visual fields are full to confrontational testing. The speech bulbar in quality and signifcant spasmodic dysphonia.   Soft palate rises symmetrically and there is no tongue deviation. Hearing is intact to conversational tone. Sensation: Sensation is intact to light touch throughout. Motor: Strength is 5/5 in the bilateral upper and lower extremities.   Shoulder shrug is equal and  symmetric.  There is no pronator drift.   Movement examination: Tone: There is normal tone in the bilateral upper extremities.  The tone in the lower extremities is normal.  Abnormal movements: There is intermittent resting tremor of the  [name] but it is of mod intensity when it is present Coordination:  There is some decremation with finger taps on the L. Gait and Station: The patient has no difficulty arising out of a deep-seated chair without the use of the hands. The patient's stride length is normal.  The patient has a negative pull test.     Lab Results  Component Value Date   VITAMINB12 292 09/26/2014   .   Chemistry      Component Value Date/Time   NA 141 06/20/2016 0801   K 3.8 06/20/2016 0801   CL 104 06/20/2016 0801   CO2 31 06/20/2016 0801   BUN 13 06/20/2016 0801   CREATININE 1.05 06/20/2016 0801   CREATININE 0.94 09/26/2014 0925      Component Value Date/Time   CALCIUM 8.9 06/20/2016 0801   ALKPHOS 51 06/20/2016 0801   AST 24 06/20/2016 0801   ALT 34 06/20/2016 0801   BILITOT 0.7 06/20/2016 0801     Lab Results  Component Value Date   WBC 11.6 (H) 03/27/2015   HGB 15.9 03/27/2015   HCT 47.8 03/27/2015   MCV 97.7 03/27/2015   PLT 284.0 03/27/2015       ASSESSMENT/PLAN:  1.  Mild PD  -Even though he has prominent facial dystonia (meige syndrome), I don't think that this is related as this started in 1994.  He has found Mirapex ER 1.5 mg to be helpful.   He does think that it has helped tremor but not fully but we both decided it would not be worth increasing the dosage. He has no compulsive side effects with the Mirapex.   -Congratulated the patient as he is doing much better with cardiovascular exercise. 2.  Dysphagia  -primarily related to Meige syndrome.  -He did have a modified barium swallow on 10/18/2015.  There is mild oral and moderate pharyngeal dysphagia.  This was worse with solids than liquids.  3.  Paresthesias.  -Probable due to peripheral neuropathy, which is likely due to his history of chemotherapy from non-Hodgkin's lymphoma.    -His B12 has been on the low end, but he has been faithfully taking a supplement.   4.  Constipation  -He has a copy of rancho recipe 5.   Follow-up in the next few months (3-4 months), sooner should new neurologic issues arise.

## 2016-07-17 ENCOUNTER — Ambulatory Visit (INDEPENDENT_AMBULATORY_CARE_PROVIDER_SITE_OTHER): Payer: Medicare Other | Admitting: Neurology

## 2016-07-17 ENCOUNTER — Encounter: Payer: Self-pay | Admitting: Neurology

## 2016-07-17 VITALS — BP 116/60 | HR 70 | Ht 66.0 in | Wt 179.0 lb

## 2016-07-17 DIAGNOSIS — G20 Parkinson's disease: Secondary | ICD-10-CM | POA: Diagnosis not present

## 2016-07-17 DIAGNOSIS — G244 Idiopathic orofacial dystonia: Secondary | ICD-10-CM | POA: Diagnosis not present

## 2016-09-01 ENCOUNTER — Other Ambulatory Visit: Payer: Self-pay | Admitting: Neurology

## 2016-09-25 ENCOUNTER — Other Ambulatory Visit (INDEPENDENT_AMBULATORY_CARE_PROVIDER_SITE_OTHER): Payer: Medicare Other

## 2016-09-25 ENCOUNTER — Ambulatory Visit: Payer: BLUE CROSS/BLUE SHIELD | Admitting: Family Medicine

## 2016-09-25 DIAGNOSIS — E785 Hyperlipidemia, unspecified: Secondary | ICD-10-CM | POA: Diagnosis not present

## 2016-09-25 LAB — LIPID PANEL
CHOL/HDL RATIO: 4
Cholesterol: 174 mg/dL (ref 0–200)
HDL: 46.5 mg/dL (ref >39.00)
LDL Cholesterol: 108 mg/dL — ABNORMAL HIGH (ref 0–99)
NONHDL: 127.78
Triglycerides: 97 mg/dL (ref 0.0–149.0)
VLDL: 19.4 mg/dL (ref 0.0–40.0)

## 2016-11-07 ENCOUNTER — Ambulatory Visit (INDEPENDENT_AMBULATORY_CARE_PROVIDER_SITE_OTHER): Payer: Medicare Other | Admitting: Family Medicine

## 2016-11-07 ENCOUNTER — Encounter: Payer: Self-pay | Admitting: Family Medicine

## 2016-11-07 VITALS — BP 121/62 | HR 84 | Temp 98.2°F | Ht 66.0 in | Wt 165.4 lb

## 2016-11-07 DIAGNOSIS — H6121 Impacted cerumen, right ear: Secondary | ICD-10-CM | POA: Diagnosis not present

## 2016-11-07 NOTE — Progress Notes (Signed)
Pre visit review using our clinic review tool, if applicable. No additional management support is needed unless otherwise documented below in the visit note. 

## 2016-11-07 NOTE — Progress Notes (Signed)
Chief Complaint  Patient presents with  . Ear Fullness    Pt reports RT ear clogged up    Pt feels his R ear is clogged and he cannot hear out of it. No pain, drainage or bleeding.  BP 121/62 (BP Location: Left Arm, Patient Position: Sitting, Cuff Size: Small)   Pulse 84   Temp 98.2 F (36.8 C) (Oral)   Ht 5\' 6"  (1.676 m)   Wt 165 lb 6.4 oz (75 kg)   SpO2 95%   BMI 26.70 kg/m  Ears- R ear completely obstructed with cerumen. L ear patent with small amount of cerumen present. After procedure, canals patent, TM's neg b/l Psych- age appropriate judgment and insight  Cerumen removal Verbal consent obtained Curette with light source used to remove cerumen from ear. Pt started to have apprehension and some sensitivity during procedure so remainder of wax was removed by irrigation. There were no complications noted. The patient otherwise tolerated the procedure well.  Impacted cerumen of right ear  Don't use q-tips. Hearing notably improved by patient. F/u prn. Pt voiced understanding and agreement to plan.  [place], DO 11/07/16 8:17 AM

## 2016-11-19 NOTE — Progress Notes (Signed)
[name] [name] was seen today in the movement disorders clinic for neurologic consultation at the request of [name], DO.  The consultation is for the evaluation of tremor.  This patient is accompanied in the office by his spouse who supplements the history.    The pt was dx with dystonia in [place] in 1994.  He presented to the speech pathologist and was dx with spasmodic dysphonia.  He was then referred to the neurologist who confirmed the diagnosis and did and MRI of the brain that was normal.  He had botox in to the vocal cords and that was helpful.  He would lose the voice for 6 weeks after the injections and then would get the voice back for 6 weeks.  He would go back to [place] twice per year for the injections.  He hasn't had the injections for the last 2 years as he quit working.  The spasmodic dysphonia is worse on the phone.  Last ST was 1995.  Having some trouble with swallowing.  Had developed some movements in the face and tried botox there but didn't really help there.  Has had some neck extension as well with the dystonic movements.  Wife states that these sx's have been better since retirement; he is under less stress.  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  The patient reports that [name] tremor started in the thumb in may and has slowly gotten worse and 3 weeks ago, he noted just a little in the [name].  He notes the tremor most at rest.  09/26/14 update:  The patient returns today for follow-up.  The patient has a history of Meige's syndrome, but presented last time with parkinsonism, but did not meet all the criteria for idiopathic Parkinson's disease.  Nonetheless, the patient wanted to start on medication.  We started him on Mirapex ER and he has worked his way up to 1.5 mg daily.  The patient reports that he is about 50% better.  He states that it has helped tremor but it isn't gone.  His wife thinks that mirapex is disrupting his sleep.   He has constipation and  thought that it was the mirapex.  His balance has been good.   He had an MRI of the brain since last visit and that was unremarkable.  Because of significant dysphagia from his Meige syndrome he had a modified barium swallow on 08/07/2014 and that revealed mild oral dysphagia, moderate pharyngeal phase dysphagia and a regular diet with thin liquids was recommended.   Has a strange sensation under the bottom of the foot like something is under the ball of the foot.  Has had paresthesias off and on for a long time.  Did get chemo with nonHodgkins and been on B6 since.  01/02/15 update:  The patient is following up today, accompanied by his wife is supplements the history.  The patient is on Mirapex ER, 1.5 mg daily.  Overall, the patient states that he is doing well.  No falls.  No hallucinations.  He continues to have issues with dysphagia, but that is primarily from his long history of Meige syndrome.  He continues to not need much sleep at night but it is not transitioning into daytime sleepiness.    05/03/15 update:  The patient returns today for follow-up, accompanied by his wife who supplements the history.  The patient is on pramipexole ER, 1.5 mg daily. He is doing well on it. He states  that he was exercising but he "fell off the wagon."   He was referred last visit for physical and speech therapy. He states that he never got a call in that regard.   He does have a history of Meige syndrome, which is the primarily etiology for the dysphagia and some of the speech issues.  Last visit, I did ask him to add B12 supplementation, as his B12 was on the low end of normal.  He has been taking his B12 supplement faithfully.  He already he has peripheral neuropathy because of his history of chemotherapy.   Pt states that he is sleeping better than last visit.    09/03/15 update:  The patient follows up today, accompanied by his wife who supplements the history.  He remains on pramipexole ER, 1.5 mg daily.  He has  completed physical therapy and remains in speech therapy.  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  He is doing his PT at home.  Hasn't been faithful with CV exercise.  Has just started melatonin for insomnia and thinks it is helping.   Overall, the patient has been stable.  He has not experienced any falls.  No hallucinations.  No lightheadedness or near syncope.  He has not choked on any of his foods.  His wife is noting some short term memory issues - he forgot to shut garage door and one time forgot to take mirapex.  Pt noted that he had bad RLS when he did that.    01/08/16 update:  The patient follows up today, accompanied by his wife who supplements the history.  He is on Mirapex ER, 1.5 g daily.  He denies compulsive behaviors.  No falls since last visit.  He does have tremor, but not bothersome enough to change his medication.  No hallucinations.  No lightheadedness or near syncope.  He did have a modified barium swallow on 10/18/2015.  There is mild oral and moderate pharyngeal dysphagia.  This was worse with solids than liquids.  The patient felt that this was actually better than his last study.  There is no dietary changes recommended interventions recommended that he eat slowly, take small bites and drink plenty of liquids after his small solid bites.  04/11/16 update:  The patient follows up today, accompanied by his wife who supplements the history.  He is on Mirapex ER, 1.5 g daily.  He denies compulsive behaviors.  No falls since last visit.  He does have tremor, but not bothersome enough to change his medication.  No hallucinations.  No lightheadedness or near syncope.  He did have his therapy screens and while physical therapy was not recommended, occupational and speech therapy were and he will be starting those soon.  Not exercising.    07/17/16 update:  The patient is seen today, accompanied by his wife who supplements the  history.  He is on pramipexole ER, 1.5 mg daily.  He reports that he is doing well.  He continues to have some tremor, but does not want to change his medications.  He denies any falls.  He denies hallucinations.  He denies compulsive behaviors.  He denies lightheadedness or near syncope.  He is now faithfully exercising for 2 months.  His doctor told him that he had to clean up his diet and start exercising and he did this.  He is physically feeling better.  11/21/16 update:  Patient seen today, accompanied by his wife  who supplements the history.  Patient remains on pramipexole ER, 1.5 mg daily.  Patient reports that he has been stable.  Biggest issue is tremor, but that has not changed.  He denies any falls.  Denies compulsive behaviors.  Denies sleep attacks.  Denies lightheadedness or near syncope.  Regarding exercise, he states "I was going great for a few months and then I got off the wagon."  States that his diet has been good and his cholesterol was 25% improved.     ALLERGIES:  No Known Allergies  CURRENT MEDICATIONS:  Outpatient Encounter Prescriptions as of 11/21/2016  Medication Sig  . aspirin 81 MG chewable tablet Chew 81 mg by mouth daily.  . Cholecalciferol (VITAMIN D3) 3000 UNITS TABS Take 1,000 Units by mouth.   . cyanocobalamin 100 MCG tablet Take 1,000 mcg by mouth daily.   . Melatonin 3 MG TABS Take by mouth at bedtime.  . Pramipexole Dihydrochloride 1.5 MG TB24 TAKE 1 TABLET DAILY   No facility-administered encounter medications on file as of 11/21/2016.     PAST MEDICAL HISTORY:   Past Medical History:  Diagnosis Date  . Cervical lymphadenopathy    right - followed my ENT  . Dystonia 1994   diagnosed in [place]  . Non Hodgkin's lymphoma ([place]) 2007   diffuse- 6 cycles of chemo with R CHOP Rituxan - last dose 10/08  . Parkinson disease ([place])    2015    PAST SURGICAL HISTORY:   Past Surgical History:  Procedure Laterality Date  . CHOLECYSTECTOMY, LAPAROSCOPIC    .  EYE SURGERY     x2 as child  . PORT-A-CATH REMOVAL    . PORTACATH PLACEMENT    . SPLENECTOMY    . TONSILLECTOMY      SOCIAL HISTORY:   Social History   Social History  . Marital status: Married    Spouse name: N/A  . Number of children: N/A  . Years of education: N/A   Occupational History  . Not on file.   Social History Main Topics  . Smoking status: Never Smoker  . Smokeless tobacco: Never Used  . Alcohol use No  . Drug use: No  . Sexual activity: Not on file   Other Topics Concern  . Not on file   Social History Narrative  . No narrative on file    FAMILY HISTORY:   Family Status  Relation Status  . Mother Deceased   heart disease, lung cancer  . Father Deceased   heart attack  . Brother Deceased   colon cancer  . Sister Alive   unknown  . Son Alive   anemia  . Son Alive   healthy  . Daughter Alive   healthy  . Son Deceased   debrancher's enzyme disease  . Son Deceased   debrancher's enzyme disease    ROS:  A complete 10 system review of systems was obtained and was unremarkable apart from what is mentioned above.  PHYSICAL EXAMINATION:    VITALS:   Vitals:   11/21/16 1032  BP: 122/80  Pulse: 70  SpO2: 97%  Weight: 166 lb (75.3 kg)  Height: 5\' 6"  (1.676 m)   Wt Readings from Last 3 Encounters:  11/21/16 166 lb (75.3 kg)  11/07/16 165 lb 6.4 oz (75 kg)  07/17/16 179 lb (81.2 kg)     GEN:  The patient appears stated age and is in NAD. HEENT:  Normocephalic, atraumatic.  The mucous membranes are moist. The superficial temporal  arteries are without ropiness or tenderness. CV:  RRR Lungs:  CTAB Neck/HEME:  There are no carotid bruits bilaterally.  Neurological examination:  Orientation: The patient is alert and oriented x3.  Cranial nerves: There is good facial symmetry.  Slight facial hypomimia.  Mild oromandibular dystonia. Pupils are equal round and reactive to light bilaterally. Fundoscopic exam reveals clear margins  bilaterally. There is L exotropia (chronic). The visual fields are full to confrontational testing. The speech bulbar in quality and signifcant spasmodic dysphonia.   Soft palate rises symmetrically and there is no tongue deviation. Hearing is intact to conversational tone. Sensation: Sensation is intact to light touch throughout. Motor: Strength is 5/5 in the bilateral upper and lower extremities.   Shoulder shrug is equal and symmetric.  There is no pronator drift.   Movement examination: Tone: There is normal tone in the bilateral upper extremities.  The tone in the lower extremities is normal.  Abnormal movements: There is intermittent resting tremor of the [name] but it is of mod intensity when it is present Coordination:  There is some decremation with finger taps on the L. Gait and Station: The patient has no difficulty arising out of a deep-seated chair without the use of the hands. The patient's stride length is normal.  The patient has a negative pull test.     Lab Results  Component Value Date   VITAMINB12 292 09/26/2014   .   Chemistry      Component Value Date/Time   NA 141 06/20/2016 0801   K 3.8 06/20/2016 0801   CL 104 06/20/2016 0801   CO2 31 06/20/2016 0801   BUN 13 06/20/2016 0801   CREATININE 1.05 06/20/2016 0801   CREATININE 0.94 09/26/2014 0925      Component Value Date/Time   CALCIUM 8.9 06/20/2016 0801   ALKPHOS 51 06/20/2016 0801   AST 24 06/20/2016 0801   ALT 34 06/20/2016 0801   BILITOT 0.7 06/20/2016 0801     Lab Results  Component Value Date   WBC 11.6 (H) 03/27/2015   HGB 15.9 03/27/2015   HCT 47.8 03/27/2015   MCV 97.7 03/27/2015   PLT 284.0 03/27/2015       ASSESSMENT/PLAN:  1.  Mild PD  -Even though he has prominent facial dystonia (meige syndrome), I don't think that this is related as this started in 1994.  He has found Mirapex ER 1.5 mg to be helpful.   He does think that it has helped tremor but not fully but we both decided it would  not be worth increasing the dosage. He has no compulsive side effects with the Mirapex.   -asked him to restart exercise. 2.  Dysphagia  -primarily related to Meige syndrome.  -He did have a modified barium swallow on 10/18/2015.  There is mild oral and moderate pharyngeal dysphagia.  This was worse with solids than liquids.  3.  Paresthesias.  -Probable due to peripheral neuropathy, which is likely due to his history of chemotherapy from non-Hodgkin's lymphoma.    -His B12 has been on the low end, but he has been faithfully taking a supplement.   4.  Constipation  -He has a copy of rancho recipe 5.  Follow-up in the next few months (3-4 months), sooner should new neurologic issues arise.

## 2016-11-21 ENCOUNTER — Ambulatory Visit (INDEPENDENT_AMBULATORY_CARE_PROVIDER_SITE_OTHER): Payer: Medicare Other | Admitting: Neurology

## 2016-11-21 ENCOUNTER — Encounter: Payer: Self-pay | Admitting: Neurology

## 2016-11-21 VITALS — BP 122/80 | HR 70 | Ht 66.0 in | Wt 166.0 lb

## 2016-11-21 DIAGNOSIS — G244 Idiopathic orofacial dystonia: Secondary | ICD-10-CM

## 2016-11-21 DIAGNOSIS — G20 Parkinson's disease: Secondary | ICD-10-CM

## 2016-12-01 ENCOUNTER — Telehealth: Payer: Self-pay | Admitting: Neurology

## 2016-12-01 MED ORDER — PRAMIPEXOLE DIHYDROCHLORIDE ER 1.5 MG PO TB24: 1.0000 | ORAL_TABLET | Freq: Every day | ORAL | 1 refills | Status: DC

## 2016-12-01 NOTE — Telephone Encounter (Signed)
PT called and said they need a refill on medication/Dawn CB# [phone]

## 2016-12-01 NOTE — Telephone Encounter (Signed)
Mirapex sent to pharmacy.

## 2016-12-02 ENCOUNTER — Telehealth: Payer: Self-pay | Admitting: Neurology

## 2016-12-02 MED ORDER — PRAMIPEXOLE DIHYDROCHLORIDE ER 1.5 MG PO TB24: 1.0000 | ORAL_TABLET | Freq: Every day | ORAL | 1 refills | Status: DC

## 2016-12-02 NOTE — Telephone Encounter (Signed)
RX sent. Patient's wife made aware.

## 2016-12-02 NOTE — Telephone Encounter (Signed)
Patient wife states we need to send a new rx to the new pharmacy which is [facility] of the pramipexole patient phone number is [phone]

## 2016-12-18 ENCOUNTER — Ambulatory Visit: Payer: Medicare Other

## 2016-12-18 ENCOUNTER — Ambulatory Visit: Payer: Medicare Other | Admitting: Physical Therapy

## 2016-12-18 ENCOUNTER — Ambulatory Visit: Payer: Medicare Other | Admitting: Occupational Therapy

## 2016-12-24 ENCOUNTER — Ambulatory Visit (INDEPENDENT_AMBULATORY_CARE_PROVIDER_SITE_OTHER): Payer: Medicare Other | Admitting: Family Medicine

## 2016-12-24 ENCOUNTER — Encounter: Payer: Self-pay | Admitting: Family Medicine

## 2016-12-24 VITALS — BP 102/78 | HR 68 | Temp 97.7°F | Resp 16 | Ht 66.0 in | Wt 165.4 lb

## 2016-12-24 DIAGNOSIS — E7800 Pure hypercholesterolemia, unspecified: Secondary | ICD-10-CM | POA: Diagnosis not present

## 2016-12-24 DIAGNOSIS — Z9189 Other specified personal risk factors, not elsewhere classified: Secondary | ICD-10-CM

## 2016-12-24 LAB — LIPID PANEL
Cholesterol: 170 mg/dL (ref 0–200)
HDL: 47.9 mg/dL (ref >39.00)
LDL Cholesterol: 106 mg/dL — ABNORMAL HIGH (ref 0–99)
NONHDL: 121.85
Total CHOL/HDL Ratio: 4
Triglycerides: 80 mg/dL (ref 0.0–149.0)
VLDL: 16 mg/dL (ref 0.0–40.0)

## 2016-12-24 NOTE — Patient Instructions (Signed)
Keep up the good work.  Give me 2-3 business days to get your labs back.

## 2016-12-24 NOTE — Progress Notes (Signed)
Chief Complaint  Patient presents with  . Hyperlipidemia    follow up    Subjective: Patient is a 66 y.o. male here for f/u cholesterol.  In the fall of 2017, the patient had labs that showed an increased risk of heart attack and stroke over the next 10 years, >7.5%. He was recommended to go on a statin due to this risk, however requested to try lifestyle modifications first. He has lost 20 pounds since that time and his diet is significantly improved. He is also more physically active. He is not having any chest pain or shortness of breath. He does take an aspirin daily  ROS: Heart: Denies chest pain  Lungs: Denies SOB   Family History  Problem Relation Age of Onset  . Heart disease Mother   . Cancer Mother     lung  . Heart disease Father    Past Medical History:  Diagnosis Date  . Cervical lymphadenopathy    right - followed my ENT  . Dystonia 1994   diagnosed in [place]  . Non Hodgkin's lymphoma ([place]) 2007   diffuse- 6 cycles of chemo with R CHOP Rituxan - last dose 10/08  . Parkinson disease ([place])    2015   No Known Allergies  Current Outpatient Prescriptions:  .  aspirin 81 MG chewable tablet, Chew 81 mg by mouth daily., Disp: , Rfl:  .  Cholecalciferol (VITAMIN D3) 3000 UNITS TABS, Take 1,000 Units by mouth. , Disp: , Rfl:  .  cyanocobalamin 100 MCG tablet, Take 1,000 mcg by mouth daily. , Disp: , Rfl:  .  Melatonin 3 MG TABS, Take by mouth at bedtime., Disp: , Rfl:  .  Pramipexole Dihydrochloride 1.5 MG TB24, Take 1 tablet (1.5 mg total) by mouth daily., Disp: 90 tablet, Rfl: 1  Objective: BP 102/78 (BP Location: Left Arm, Patient Position: Sitting, Cuff Size: Normal)   Pulse 68   Temp 97.7 F (36.5 C) (Oral)   Resp 16   Ht 5\' 6"  (1.676 m)   Wt 165 lb 6.4 oz (75 kg)   SpO2 96%   BMI 26.70 kg/m  General: Awake, appears stated age HEENT: MMM, EOMi Heart: RRR, no murmurs Lungs: CTAB, no rales, wheezes or rhonchi. No accessory muscle use Abd: BS+, soft,  NT, ND, no masses or organomegaly Psych: Age appropriate judgment and insight, normal affect and mood  Assessment and Plan: 10 year risk of MI or stroke 7.5% or greater  Pure hypercholesterolemia - Plan: Lipid panel  Orders as above. Cont ASA. If >7.5%, will suggest ASA. Counseled on diet and exercise, he is doing well. Challenged to lift weights. F/u in around 6 mo pending above. The patient voiced understanding and agreement to the plan.  [place], DO 12/24/16  8:28 AM

## 2016-12-24 NOTE — Progress Notes (Signed)
Pre visit review using our clinic review tool, if applicable. No additional management support is needed unless otherwise documented below in the visit note. 

## 2017-04-13 NOTE — Progress Notes (Signed)
[name] [name] was seen today in the movement disorders clinic for neurologic consultation at the request of [name], DO.  The consultation is for the evaluation of tremor.  This patient is accompanied in the office by his spouse who supplements the history.    The pt was dx with dystonia in [place] in 1994.  He presented to the speech pathologist and was dx with spasmodic dysphonia.  He was then referred to the neurologist who confirmed the diagnosis and did and MRI of the brain that was normal.  He had botox in to the vocal cords and that was helpful.  He would lose the voice for 6 weeks after the injections and then would get the voice back for 6 weeks.  He would go back to [place] twice per year for the injections.  He hasn't had the injections for the last 2 years as he quit working.  The spasmodic dysphonia is worse on the phone.  Last ST was 1995.  Having some trouble with swallowing.  Had developed some movements in the face and tried botox there but didn't really help there.  Has had some neck extension as well with the dystonic movements.  Wife states that these sx's have been better since retirement; he is under less stress.  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  The patient reports that [name] tremor started in the thumb in may and has slowly gotten worse and 3 weeks ago, he noted just a little in the [name].  He notes the tremor most at rest.  09/26/14 update:  The patient returns today for follow-up.  The patient has a history of Meige's syndrome, but presented last time with parkinsonism, but did not meet all the criteria for idiopathic Parkinson's disease.  Nonetheless, the patient wanted to start on medication.  We started him on Mirapex ER and he has worked his way up to 1.5 mg daily.  The patient reports that he is about 50% better.  He states that it has helped tremor but it isn't gone.  His wife thinks that mirapex is disrupting his sleep.   He has constipation and  thought that it was the mirapex.  His balance has been good.   He had an MRI of the brain since last visit and that was unremarkable.  Because of significant dysphagia from his Meige syndrome he had a modified barium swallow on 08/07/2014 and that revealed mild oral dysphagia, moderate pharyngeal phase dysphagia and a regular diet with thin liquids was recommended.   Has a strange sensation under the bottom of the foot like something is under the ball of the foot.  Has had paresthesias off and on for a long time.  Did get chemo with nonHodgkins and been on B6 since.  01/02/15 update:  The patient is following up today, accompanied by his wife is supplements the history.  The patient is on Mirapex ER, 1.5 mg daily.  Overall, the patient states that he is doing well.  No falls.  No hallucinations.  He continues to have issues with dysphagia, but that is primarily from his long history of Meige syndrome.  He continues to not need much sleep at night but it is not transitioning into daytime sleepiness.    05/03/15 update:  The patient returns today for follow-up, accompanied by his wife who supplements the history.  The patient is on pramipexole ER, 1.5 mg daily. He is doing well on it. He states  that he was exercising but he "fell off the wagon."   He was referred last visit for physical and speech therapy. He states that he never got a call in that regard.   He does have a history of Meige syndrome, which is the primarily etiology for the dysphagia and some of the speech issues.  Last visit, I did ask him to add B12 supplementation, as his B12 was on the low end of normal.  He has been taking his B12 supplement faithfully.  He already he has peripheral neuropathy because of his history of chemotherapy.   Pt states that he is sleeping better than last visit.    09/03/15 update:  The patient follows up today, accompanied by his wife who supplements the history.  He remains on pramipexole ER, 1.5 mg daily.  He has  completed physical therapy and remains in speech therapy.  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  He is doing his PT at home.  Hasn't been faithful with CV exercise.  Has just started melatonin for insomnia and thinks it is helping.   Overall, the patient has been stable.  He has not experienced any falls.  No hallucinations.  No lightheadedness or near syncope.  He has not choked on any of his foods.  His wife is noting some short term memory issues - he forgot to shut garage door and one time forgot to take mirapex.  Pt noted that he had bad RLS when he did that.    01/08/16 update:  The patient follows up today, accompanied by his wife who supplements the history.  He is on Mirapex ER, 1.5 g daily.  He denies compulsive behaviors.  No falls since last visit.  He does have tremor, but not bothersome enough to change his medication.  No hallucinations.  No lightheadedness or near syncope.  He did have a modified barium swallow on 10/18/2015.  There is mild oral and moderate pharyngeal dysphagia.  This was worse with solids than liquids.  The patient felt that this was actually better than his last study.  There is no dietary changes recommended interventions recommended that he eat slowly, take small bites and drink plenty of liquids after his small solid bites.  04/11/16 update:  The patient follows up today, accompanied by his wife who supplements the history.  He is on Mirapex ER, 1.5 g daily.  He denies compulsive behaviors.  No falls since last visit.  He does have tremor, but not bothersome enough to change his medication.  No hallucinations.  No lightheadedness or near syncope.  He did have his therapy screens and while physical therapy was not recommended, occupational and speech therapy were and he will be starting those soon.  Not exercising.    07/17/16 update:  The patient is seen today, accompanied by his wife who supplements the  history.  He is on pramipexole ER, 1.5 mg daily.  He reports that he is doing well.  He continues to have some tremor, but does not want to change his medications.  He denies any falls.  He denies hallucinations.  He denies compulsive behaviors.  He denies lightheadedness or near syncope.  He is now faithfully exercising for 2 months.  His doctor told him that he had to clean up his diet and start exercising and he did this.  He is physically feeling better.  11/21/16 update:  Patient seen today, accompanied by his wife  who supplements the history.  Patient remains on pramipexole ER, 1.5 mg daily.  Patient reports that he has been stable.  Biggest issue is tremor, but that has not changed.  He denies any falls.  Denies compulsive behaviors.  Denies sleep attacks.  Denies lightheadedness or near syncope.  Regarding exercise, he states "I was going great for a few months and then I got off the wagon."  States that his diet has been good and his cholesterol was 25% improved.    04/27/17 update:  Patient seen today in follow-up, accompanied by his wife who supplements the history.  Patient is on pramipexole ER, 1.5 mg daily.  Continues to have tremor, but that has been stable.  Pt denies falls.  Occasionally off balance at the turns.  Pt denies lightheadedness, near syncope.  No hallucinations.  Mood has been good.  In regards to exercise, he states that he is planning to start the [employer] class next week.  His diet has been fairly good.  He is taking his B12 supplement.     ALLERGIES:  No Known Allergies  CURRENT MEDICATIONS:  Outpatient Encounter Prescriptions as of 04/27/2017  Medication Sig  . aspirin 81 MG chewable tablet Chew 81 mg by mouth daily.  . Cholecalciferol (VITAMIN D3) 3000 UNITS TABS Take 1,000 Units by mouth.   . cyanocobalamin 100 MCG tablet Take 1,000 mcg by mouth daily.   . Melatonin 3 MG TABS Take by mouth at bedtime.  . Pramipexole Dihydrochloride 1.5 MG TB24 Take 1 tablet (1.5 mg  total) by mouth daily.  . [DISCONTINUED] Pramipexole Dihydrochloride 1.5 MG TB24 Take 1 tablet (1.5 mg total) by mouth daily.   No facility-administered encounter medications on file as of 04/27/2017.     PAST MEDICAL HISTORY:   Past Medical History:  Diagnosis Date  . Cervical lymphadenopathy    right - followed my ENT  . Dystonia 1994   diagnosed in [place]  . Non Hodgkin's lymphoma ([place]) 2007   diffuse- 6 cycles of chemo with R CHOP Rituxan - last dose 10/08  . Parkinson disease ([place])    2015    PAST SURGICAL HISTORY:   Past Surgical History:  Procedure Laterality Date  . CHOLECYSTECTOMY, LAPAROSCOPIC    . EYE SURGERY     x2 as child  . PORT-A-CATH REMOVAL    . PORTACATH PLACEMENT    . SPLENECTOMY    . TONSILLECTOMY      SOCIAL HISTORY:   Social History   Social History  . Marital status: Married    Spouse name: N/A  . Number of children: N/A  . Years of education: N/A   Occupational History  . Not on file.   Social History Main Topics  . Smoking status: Never Smoker  . Smokeless tobacco: Never Used  . Alcohol use No  . Drug use: No  . Sexual activity: Not on file   Other Topics Concern  . Not on file   Social History Narrative  . No narrative on file    FAMILY HISTORY:   Family Status  Relation Status  . Mother Deceased       heart disease, lung cancer  . Father Deceased       heart attack  . Brother Deceased       colon cancer  . Sister Alive       unknown  . Son Alive       anemia  . Son [employer]  healthy  . Daughter Alive       healthy  . Son Deceased       debrancher's enzyme disease  . Son Deceased       debrancher's enzyme disease    ROS:  A complete 10 system review of systems was obtained and was unremarkable apart from what is mentioned above.  PHYSICAL EXAMINATION:    VITALS:   Vitals:   04/27/17 0830  BP: 90/60  Pulse: 72  SpO2: 97%  Weight: 168 lb (76.2 kg)  Height: 5\' 6"  (1.676 m)   Wt Readings from Last 3  Encounters:  04/27/17 168 lb (76.2 kg)  12/24/16 165 lb 6.4 oz (75 kg)  11/21/16 166 lb (75.3 kg)     GEN:  The patient appears stated age and is in NAD. HEENT:  Normocephalic, atraumatic.  The mucous membranes are moist. The superficial temporal arteries are without ropiness or tenderness. CV:  RRR Lungs:  CTAB Neck/HEME:  There are no carotid bruits bilaterally.  Neurological examination:  Orientation: The patient is alert and oriented x3.  Cranial nerves: There is good facial symmetry.  Slight facial hypomimia.  Mild oromandibular dystonia. Pupils are equal round and reactive to light bilaterally. Fundoscopic exam reveals clear margins bilaterally. There is L exotropia (chronic). The visual fields are full to confrontational testing. The speech bulbar in quality and signifcant spasmodic dysphonia.   Soft palate rises symmetrically and there is no tongue deviation. Hearing is intact to conversational tone. Sensation: Sensation is intact to light touch throughout. Motor: Strength is 5/5 in the bilateral upper and lower extremities.   Shoulder shrug is equal and symmetric.  There is no pronator drift.   Movement examination: Tone: There is normal tone in the bilateral upper extremities.  The tone in the lower extremities is normal.  Abnormal movements: There is intermittent resting tremor of the [name]  Coordination:  There is no decremation today with any RAM's Gait and Station: The patient has no difficulty arising out of a deep-seated chair without the use of the hands. The patient's stride length is normal.  The patient has a negative pull test.     Lab Results  Component Value Date   VITAMINB12 292 09/26/2014   .   Chemistry      Component Value Date/Time   NA 141 06/20/2016 0801   K 3.8 06/20/2016 0801   CL 104 06/20/2016 0801   CO2 31 06/20/2016 0801   BUN 13 06/20/2016 0801   CREATININE 1.05 06/20/2016 0801   CREATININE 0.94 09/26/2014 0925      Component Value  Date/Time   CALCIUM 8.9 06/20/2016 0801   ALKPHOS 51 06/20/2016 0801   AST 24 06/20/2016 0801   ALT 34 06/20/2016 0801   BILITOT 0.7 06/20/2016 0801     Lab Results  Component Value Date   WBC 11.6 (H) 03/27/2015   HGB 15.9 03/27/2015   HCT 47.8 03/27/2015   MCV 97.7 03/27/2015   PLT 284.0 03/27/2015       ASSESSMENT/PLAN:  1.  Mild PD  -Even though he has prominent facial dystonia (meige syndrome), I don't think that this is related as this started in 1994.  He has found Mirapex ER 1.5 mg to be helpful.   He does think that it has helped tremor but not fully but we both decided it would not be worth increasing the dosage. He has no compulsive side effects with the Mirapex.   -He plans to start the  [facility] Moves class next week and encouraged him to follow through with that.   2.  Dysphagia  -primarily related to Meige syndrome.  -He did have a modified barium swallow on 10/18/2015.  There is mild oral and moderate pharyngeal dysphagia.  This was worse with solids than liquids.  3.  Paresthesias.  -Probable due to peripheral neuropathy, which is likely due to his history of chemotherapy from non-Hodgkin's lymphoma.    -His B12 has been on the low end, but he has been faithfully taking a supplement.   4.  Low BP  -encouraged him to increase water intake and monitor BP at home.  Pt is asymptommatic. 5.  Follow-up in the next few months (4 months), sooner should new neurologic issues arise.  Much greater than 50% of this visit was spent in counseling and coordinating care.  Total face to face time:  25 min

## 2017-04-14 ENCOUNTER — Ambulatory Visit: Payer: Medicare Other | Admitting: Physical Therapy

## 2017-04-14 ENCOUNTER — Ambulatory Visit: Payer: Medicare Other | Attending: Family Medicine | Admitting: Occupational Therapy

## 2017-04-14 DIAGNOSIS — R29818 Other symptoms and signs involving the nervous system: Secondary | ICD-10-CM | POA: Insufficient documentation

## 2017-04-14 DIAGNOSIS — R2689 Other abnormalities of gait and mobility: Secondary | ICD-10-CM | POA: Insufficient documentation

## 2017-04-14 DIAGNOSIS — R278 Other lack of coordination: Secondary | ICD-10-CM

## 2017-04-14 NOTE — Therapy (Signed)
[facility] [address] [place], [place], [address] Phone: [phone]   Fax:  [phone]  Patient Details  Name: [name] [name] MRN: [id number] Date of Birth: 05-28-1951 Referring Provider: Alonza Bogus  Encounter Date: 04/14/2017  Physical Therapy Parkinson's Disease Screen   Timed Up and Go test: 8.82 sec  10 meter walk test:7.62 sec (4.3 ft/sec)-compared to 4.46 ft/sec  5 time sit to stand test:11.15 sec  Patient would benefit from Physical Therapy evaluation due to pt's reported overall slowing of mobility and pt's reports of balance changes.  He feels more forward flexed and feels he needs to take additional steps to regain balance upon taking step to regain balance.       Marty Sadlowski W. 04/14/2017, 1:49 PM  Frazier Butt., PT  [facility] [address] [place], [place], [address] Phone: [phone]   Fax:  [phone]

## 2017-04-14 NOTE — Therapy (Signed)
[facility] [address] [place], [place], [address] Phone: [phone]   Fax:  [phone]  Patient Details  Name: [name] [name] MRN: [id number] Date of Birth: 1950-12-22 Referring Provider:  [name]*  Encounter Date: 04/14/2017  Occupational Therapy Parkinson's Disease Screen  Hand dominance:  right    Physical Performance Test item #4 (donning/doffing jacket):  6.16 sec (no trunk rotation)  Fastening/unfastening 3 buttons in:  29.33sec  9-hole peg test:    [name]  25.04  sec        LUE  25.59 sec  Box and Blocks test:  [name]:  42 blocks  LUE:  43 blocks  Change in ability to perform ADLs/IADLs:  incr difficulty with writing (approx 90% legibility today, but pt reports usually worse), feels like he's declined, not performing HEP regularly  Other Comments:  Pulled muscle in back over weekend   Pt would benefit from occupational therapy evaluation due to  Decline in ADLs     [facility] 04/14/2017, 1:32 PM  [facility] [address] [place], [place], [address] Phone: [phone]   Fax:  [place], OTR/L [facility] [address] [place], [place]  [address] [phone] phone [phone] 04/14/17 3:44 PM

## 2017-04-16 ENCOUNTER — Telehealth: Payer: Self-pay | Admitting: Occupational Therapy

## 2017-04-16 DIAGNOSIS — G20 Parkinson's disease: Secondary | ICD-10-CM

## 2017-04-16 NOTE — Telephone Encounter (Signed)
Dr. Carles Collet,   [name] was seen for Parkinsons OT, PT screens 04/14/17.   Pt may benefit from OT referral (due to incr difficulty with ADLs) and PT referral (due to slowed mobility and reported decline in balance).   If you are in agreement, please send updated order for  Occupational therapy and Physical therapy via epic.  Thank you,   Vianne Bulls, OTR/L [facility] [address] [place], [place]  [address] [phone] phone [phone] 04/16/17 4:42 PM

## 2017-04-17 NOTE — Telephone Encounter (Signed)
Referral entered  

## 2017-04-27 ENCOUNTER — Ambulatory Visit (INDEPENDENT_AMBULATORY_CARE_PROVIDER_SITE_OTHER): Payer: Medicare Other | Admitting: Neurology

## 2017-04-27 ENCOUNTER — Encounter: Payer: Self-pay | Admitting: Neurology

## 2017-04-27 VITALS — BP 90/60 | HR 72 | Ht 66.0 in | Wt 168.0 lb

## 2017-04-27 DIAGNOSIS — G20 Parkinson's disease: Secondary | ICD-10-CM

## 2017-04-27 DIAGNOSIS — I959 Hypotension, unspecified: Secondary | ICD-10-CM | POA: Diagnosis not present

## 2017-04-27 MED ORDER — PRAMIPEXOLE DIHYDROCHLORIDE ER 1.5 MG PO TB24: 1.0000 | ORAL_TABLET | Freq: Every day | ORAL | 1 refills | Status: DC

## 2017-05-06 ENCOUNTER — Telehealth: Payer: Self-pay | Admitting: Neurology

## 2017-05-06 NOTE — Telephone Encounter (Signed)
Amy:   Spoke with patient and he is still interested in the class. He states if you call him back at [phone] he would like to sign up.

## 2017-05-06 NOTE — Telephone Encounter (Signed)
-----   Message from [place], DO sent at 05/06/2017 12:28 PM EDT ----- Luvenia Starch, see if you have any luck. ----- Message ----- From: Frazier Butt, PT Sent: 05/06/2017  12:16 PM To: Eustace Quail Tat, DO  I wanted to make you aware that while I was on vacation last week, Mr. Kuiken wife contacted our office to register him for the [facility]! Moves class on Wednesdays, but she was unsure if she could due to being within one week of the start.  We are always happy to accept folks right up to the start.  I have tried several times each day this week to contact them, but I have gotten no answer and the message for the phone numbers are non-descript. I did not leave a message.  If you happen to talk with them, I am happy for him to join the class.  We had talked about it at his screen several weeks ago, so I was hoping he'd join.  Thanks, Mady Haagensen, PT

## 2017-06-17 ENCOUNTER — Ambulatory Visit: Payer: Medicare Other | Admitting: Physical Therapy

## 2017-06-17 ENCOUNTER — Ambulatory Visit: Payer: Medicare Other | Attending: Family Medicine | Admitting: Occupational Therapy

## 2017-06-17 DIAGNOSIS — R293 Abnormal posture: Secondary | ICD-10-CM | POA: Insufficient documentation

## 2017-06-17 DIAGNOSIS — R278 Other lack of coordination: Secondary | ICD-10-CM | POA: Diagnosis not present

## 2017-06-17 DIAGNOSIS — R41840 Attention and concentration deficit: Secondary | ICD-10-CM | POA: Diagnosis not present

## 2017-06-17 DIAGNOSIS — R2689 Other abnormalities of gait and mobility: Secondary | ICD-10-CM

## 2017-06-17 DIAGNOSIS — R29818 Other symptoms and signs involving the nervous system: Secondary | ICD-10-CM

## 2017-06-17 DIAGNOSIS — R2681 Unsteadiness on feet: Secondary | ICD-10-CM | POA: Insufficient documentation

## 2017-06-17 DIAGNOSIS — R251 Tremor, unspecified: Secondary | ICD-10-CM | POA: Diagnosis not present

## 2017-06-17 NOTE — Therapy (Signed)
[facility] [address] [place], [place], [address] Phone: [phone]   Fax:  [phone]  Occupational Therapy Evaluation  Patient Details  Name: [name] [name] MRN: [id number] Date of Birth: [birth year] Referring Provider: Dr. Wells Guiles Tat  Encounter Date: 06/17/2017      OT End of Session - 06/17/17 1301    Visit Number 1   Number of Visits 17   Date for OT Re-Evaluation 08/16/17   Authorization Type Medicare / BCBS;  needs G-code   Authorization - Visit Number 1   Authorization - Number of Visits 10   OT Start Time 0800   OT Stop Time 0845   OT Time Calculation (min) 45 min   Activity Tolerance Patient tolerated treatment well   Behavior During Therapy [facility] for tasks assessed/performed      Past Medical History:  Diagnosis Date  . Cervical lymphadenopathy    right - followed my ENT  . Dystonia 1994   diagnosed in [place]  . Non Hodgkin's lymphoma ([place]) 2007   diffuse- 6 cycles of chemo with R CHOP Rituxan - last dose 10/08  . Parkinson disease ([place])    2015    Past Surgical History:  Procedure Laterality Date  . CHOLECYSTECTOMY, LAPAROSCOPIC    . EYE SURGERY     x2 as child  . PORT-A-CATH REMOVAL    . PORTACATH PLACEMENT    . SPLENECTOMY    . TONSILLECTOMY      There were no vitals filed for this visit.      Subjective Assessment - 06/17/17 0802    Subjective  Pt reports that he needs to get back into exercise   Pertinent History Parkinson's disease, non-hodgkin lymphoma, dystonia, Meige syndrome   Patient Stated Goals to re-establish exercise routine and improve ADLs   Currently in Pain? No/denies           [facility] OT Assessment - 06/17/17 0001      Assessment   Diagnosis Parkinson's disease   Referring Provider Dr. Wells Guiles Tat   Onset Date --  diagnosed approx 3 years ago   Prior Therapy PT, ST, OT  last OT d/c 05/2016     Precautions   Precautions Fall     Balance Screen    Has the patient fallen in the past 6 months No     Home  Environment   Family/patient expects to be discharged to: Private residence   Lives With Spouse  wife has MS     Prior Function   Level of Independence Independent   Vocation Retired   Catering manager  difficulty     ADL   Eating/Feeding Modified independent  min spills with tremors, min difficulty with opening package   Grooming Modified independent  Copy, trims facial hair with [name] some   Upper Body Bathing Modified independent   Lower Body Bathing Modified independent   Upper Body Dressing Increased time  difficulty buttons   Lower Body Dressing Independent;Increased time   Toilet Transfer Modified independent   Toileting - Clothing Manipulation Modified independent   Toileting -  Research scientist (medical) Walk in shower;Grab bars;Shower seat with back  built-in seat   Transfers/Ambulation Related to ADL's min difficulty getting up off sofa     IADL   Shopping Shops independently for small purchases  goes with wife, alone, or wife goes   Prior  Level of Function Light Housekeeping wife performs    Nurse, adult --  pt does some yardwork (but someone else does most)   Prior Level of Function Meal [employer] wife performs cooking tasks   Programmer, applications own vehicle   Medication Management Is responsible for taking medication in correct dosages at correct time   Prior Level of Function Financial Management wife has always performs      Mobility   Mobility Status Independent   Mobility Status Comments slower with turning     Written Expression   Dominant Hand Right   Handwriting 25% legible;Severe micrographia  x3 sentences     Vision - History   Baseline Vision Wears glasses all the time  progressive lens   Additional Comments WFL per pt     Cognition   Overall  Cognitive Status Cognition to be further assessed in functional context PRN   Attention Divided   Divided Attention Impaired  per pt     Observation/Other Assessments   Observations mod rounded shoulders/forward lean   Other Surveys  Select   Simulated Eating Time (seconds) 13.66   Donning Doffing Jacket Time (seconds) 5.87   Donning Doffing Jacket Comments 3 button/ unbutton:  25.06 secs  used R hand minimally     Coordination   Right 9 Hole Peg Test 28.81   Left 9 Hole Peg Test 24.75   Box and Blocks [name] 50 blocks, LUE 45 blocks   Tremors R hand, primarily with rest     Tone   Assessment Location Right Upper Extremity;Left Upper Extremity     ROM / Strength   AROM / PROM / Strength AROM     [name] Tone   [name] Tone Within Functional Limits     LUE Tone   LUE Tone Within Functional Limits                         OT Education - 06/17/17 1434    Education Details PD education (nonmotor symptoms of anxiety, depression, apathy, decr interest associated with PD; decr awareness of movement/sensory changes associated with PD); OT eval results and POC   Person(s) Educated Patient   Methods Explanation   Comprehension Verbalized understanding          OT Short Term Goals - 06/17/17 1424      OT SHORT TERM GOAL #1   Title Pt will be independent with updated PD specific HEP--check STGs 07/16/17   Time 4   Period Weeks   Status New     OT SHORT TERM GOAL #2   Title Pt will be able to write at least 2 sentences with at least 75% legibility.   Baseline 25%   Time 4   Period Weeks   Status New     OT SHORT TERM GOAL #3   Title Pt will demonstrate increased LUE functional use as evidenced by increasing box/ blocks score by 4 blocks.   Baseline R-50, L-45 blocks   Time 4   Period Weeks   Status New     OT SHORT TERM GOAL #4   Title Pt will verbalize understanding of ways to prevent future complications and appropriate community resources.     OT SHORT  TERM GOAL #5   Title ---------------           OT Long Term Goals - 06/17/17 1427      OT LONG TERM GOAL #1   Title Pt will  verbalize understanding of large amplitude movement strategies to incr ease/efficiency with ADLs/IADLs.--check LTGs 08/16/17   Baseline --   Time 8   Period Weeks   Status New     OT LONG TERM GOAL #2   Title Pt will improve dominant R hand coordination for ADLs as shown by improving time on 9-hole peg test by at least 3sec.   Baseline R-28.81sec, L-24.75sec   Time 8   Period Weeks   Status New     OT LONG TERM GOAL #3   Title Pt will be able to write at least 3 sentences with at least 90% legibility.   Baseline 25%   Time 8   Period Weeks   Status New     OT LONG TERM GOAL #4   Title Pt will improve ability to fasten/unfasten 3 futtons in 22sec or less using BUEs.   Baseline 25.06sec with minimal use of R hand   Time 4   Period Weeks   Status New               Plan - 2017-07-09 1304    Clinical Impression Statement Pt presents with bradykinesia, abnormal posture, decr coordination, decr divided attention, decr functional mobilty for ADLs, tremor.  Pt would benefit from occupational therapy to address these deficits from improved dominant [name] functional use, incr ease/efficiency with ADLs/IADLs, and to prevent future complications.   Occupational Profile and client history currently impacting functional performance Pt is a 66 y.o. male with Parkinson's disease.  Pt with PMH that includes:  non-hodgkin lymphoma, dystonia, Meige syndrome.  Pt reports decline in ADLs/incr time needed for ADLs since last OT d/c.  Pt reports desire to establish updated HEP as well.  Pt's wife has MS so pt assists her prn as well.     Occupational performance deficits (Please refer to evaluation for details): ADL's;IADL's;Leisure   Rehab Potential Good   OT Frequency 2x / week   OT Duration 8 weeks  +eval   OT Treatment/Interventions Self-care/ADL training;Moist  Heat;Fluidtherapy;DME and/or AE instruction;Patient/family education;Balance training;Therapeutic exercises;Ultrasound;Therapeutic exercise;Therapeutic activities;Cognitive remediation/compensation;Passive range of motion;Neuromuscular education;Cryotherapy;Parrafin;Energy conservation;Manual Therapy;Functional Mobility Training   Plan initiate PWR! moves    Clinical Decision Making Several treatment options, min-mod task modification necessary   Consulted and Agree with Plan of Care Patient      Patient will benefit from skilled therapeutic intervention in order to improve the following deficits and impairments:  Abnormal gait, Decreased coordination, Impaired flexibility, Impaired UE functional use, Decreased knowledge of use of DME, Decreased balance, Decreased mobility, Decreased strength, Improper spinal/pelvic alignment, Decreased cognition (bradykinesia)  Visit Diagnosis: Other symptoms and signs involving the nervous system  Other lack of coordination  Other abnormalities of gait and mobility  Abnormal posture  Tremor, unspecified  Attention and concentration deficit      G-Codes - 09-Jul-2017 1432    Functional Assessment Tool Used (Outpatient only) 3 button / unbutton: 25.06 with minimal use of R hand, 25% legibility with writing, R 9-hole peg test 28.81sec   Functional Limitation Self care   Self Care Current Status (A2130) At least 40 percent but less than 60 percent impaired, limited or restricted   Self Care Goal Status (Q6578) At least 1 percent but less than 20 percent impaired, limited or restricted      Problem List Patient Active Problem List   Diagnosis Date Noted  . 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 Dysphagia, neurologic 07/27/2014  . Parkinson  disease ([place]) 07/27/2014  . Dystonia 07/10/2014  . Low back pain 11/21/2011  . TRANSAMINASES, SERUM, ELEVATED 07/06/2008  . DIARRHEA 05/23/2008   . LYMPH NODE-ENLARGED 07/28/2007  . Non-Hodgkin lymphoma ([place]) 03/23/2007    [facility] 06/17/2017, 2:35 PM  [facility] [address] [place], [place], [address] Phone: [phone]   Fax:  [phone]  Name: GERARD CANTARA MRN: [id number] Date of Birth: 01-15-1951   Vianne Bulls, OTR/L [facility] [address] [place], [place]  [address] [phone] phone [phone] 06/17/17 2:35 PM

## 2017-06-17 NOTE — Therapy (Addendum)
[facility] [address] [place], [place], [address] Phone: [phone]   Fax:  [phone]  Physical Therapy Evaluation  Patient Details  Name: [name] [name] MRN: [id number] Date of Birth: Feb 17, 1951 Referring Provider: Alonza Bogus, DO  Encounter Date: 06/17/2017      PT End of Session - 06/17/17 2056    Visit Number 1   Number of Visits 13   Date for PT Re-Evaluation 08/16/17   Authorization Type Medicare Traditional primary, BCBS secondary-GCODE every 10th visit   PT Start Time 0848   PT Stop Time 0933   PT Time Calculation (min) 45 min   Activity Tolerance Patient tolerated treatment well   Behavior During Therapy [facility] for tasks assessed/performed      Past Medical History:  Diagnosis Date  . Cervical lymphadenopathy    right - followed my ENT  . Dystonia 1994   diagnosed in [place]  . Non Hodgkin's lymphoma ([place]) 2007   diffuse- 6 cycles of chemo with R CHOP Rituxan - last dose 10/08  . Parkinson disease ([place])    2015    Past Surgical History:  Procedure Laterality Date  . CHOLECYSTECTOMY, LAPAROSCOPIC    . EYE SURGERY     x2 as child  . PORT-A-CATH REMOVAL    . PORTACATH PLACEMENT    . SPLENECTOMY    . TONSILLECTOMY      There were no vitals filed for this visit.       Subjective Assessment - 06/17/17 0857    Subjective Pt reports his number one goal is needing to work on motivation for exercising.  I have had the exercises in the past and they have helped, but I'm not doing the exercises now.  No reported falls.  Pt reports feels more forward with posture with walking, and being off-balance more.  "Everything is subtle."   Patient Stated Goals To regain motivation for exercises   Currently in Pain? No/denies            [name] PT Assessment - 06/17/17 0900      Assessment   Medical Diagnosis Parkinson's disease   Referring Provider Wells Guiles Tat, DO   Onset Date/Surgical Date  04/14/17  screen     Precautions   Precautions Fall     Balance Screen   Has the patient fallen in the past 6 months No   Has the patient had a decrease in activity level because of a fear of falling?  No   Is the patient reluctant to leave their home because of a fear of falling?  No     Home Environment   Living Environment Private residence   Living Arrangements Spouse/significant other   Type of [place] to enter;Ramped entrance   Entrance Stairs-Number of Steps 2   [place] on third floor   [place] --  wheelchair accessible shower   Additional Comments Wife uses wheelchair, but pt does not have to give any physical assistance to wife at this time.     Prior Function   Level of Independence Independent   Vocation Retired   Adult nurse.  Enjoys playing with grandkids ages 25 and below     Observation/Other Assessments   Focus on Therapeutic Outcomes (FOTO)  NA     Posture/Postural Control   Posture/Postural Control Postural limitations   Postural Limitations Forward head;Rounded Shoulders;Posterior pelvic tilt  ROM / Strength   AROM / PROM / Strength Strength     Strength   Overall Strength Comments Grossly tested 5/5 bilateral lower extremities     Transfers   Transfers Sit to Stand;Stand to Sit   Sit to Stand 6: Modified independent (Device/Increase time);Without upper extremity assist;From chair/3-in-1   Five time sit to stand comments  9.18   Stand to Sit 6: Modified independent (Device/Increase time);Without upper extremity assist;To chair/3-in-1     Ambulation/Gait   Ambulation/Gait Yes   Ambulation/Gait Assistance 6: Modified independent (Device/Increase time)   Ambulation Distance (Feet) 250 Feet   Assistive device None   Gait Pattern Step-through pattern;Decreased arm swing - right;Trunk flexed;Decreased trunk rotation  forward flexed posture   Ambulation Surface  Level;Indoor   Gait velocity 8.81 sec = 3.72 ft/sec     Standardized Balance Assessment   Standardized Balance Assessment Timed Up and Go Test     Timed Up and Go Test   Normal TUG (seconds) 10.43   Manual TUG (seconds) 10.28   Cognitive TUG (seconds) 10.87     High Level Balance   High Level Balance Comments Push and release:  anterior and posterior-pt takes one step and regains balance.  To R side, pt takes >2 steps, but regains balance.     Functional Gait  Assessment   Gait assessed  Yes   Gait Level Surface Walks 20 ft in less than 7 sec but greater than 5.5 sec, uses assistive device, slower speed, mild gait deviations, or deviates 6-10 in outside of the 12 in walkway width.  5.54   Change in Gait Speed Able to smoothly change walking speed without loss of balance or gait deviation. Deviate no more than 6 in outside of the 12 in walkway width.   Gait with Horizontal Head Turns Performs head turns smoothly with slight change in gait velocity (eg, minor disruption to smooth gait path), deviates 6-10 in outside 12 in walkway width, or uses an assistive device.   Gait with Vertical Head Turns Performs task with slight change in gait velocity (eg, minor disruption to smooth gait path), deviates 6 - 10 in outside 12 in walkway width or uses assistive device   Gait and Pivot Turn Pivot turns safely within 3 sec and stops quickly with no loss of balance.   Step Over Obstacle Is able to step over one shoe box (4.5 in total height) without changing gait speed. No evidence of imbalance.   Gait with Narrow Base of Support Ambulates less than 4 steps heel to toe or cannot perform without assistance.  3 steps   Gait with Eyes Closed Walks 20 ft, slow speed, abnormal gait pattern, evidence for imbalance, deviates 10-15 in outside 12 in walkway width. Requires more than 9 sec to ambulate 20 ft.  12.69   Ambulating Backwards Walks 20 ft, uses assistive device, slower speed, mild gait deviations,  deviates 6-10 in outside 12 in walkway width.  12.69   Steps Alternating feet, must use rail.   Total Score 19   FGA comment: Scores <22/30 indicate increased fall risk.            Objective measurements completed on examination: See above findings.                  PT Education - 06/17/17 2054    Education provided Yes   Education Details Patient has previous expressed interest in community PWR! Moves exercise classes; provided info on next session  of PWR! Moves starting 07/01/17 and discussed [facility] cycling options; PT eval results and POC   Person(s) Educated Patient   Methods Explanation   Comprehension Verbalized understanding          PT Short Term Goals - 06/17/17 2113      PT SHORT TERM GOAL #1   Title Pt will be independent with HEP for improved posture, balance, functional mobility.  TARGET 07/17/17   Time 5   Period Weeks   Status New   Target Date 07/17/17     PT SHORT TERM GOAL #2   Title Pt will verbalize understanding of fall prevention in home environment.   Time 5   Period Weeks   Status New   Target Date 07/17/17     PT SHORT TERM GOAL #3   Title Pt will improve push and release test to R side, to <2 steps independently regaining balance, for improved balance reactions.   Time 5   Period Weeks   Status New   Target Date 07/17/17           PT Long Term Goals - 06/17/17 2116      PT LONG TERM GOAL #1   Title Pt will verbalize plans for continued community fitness upon d/c from PT.  TARGET 07/31/17   Time 7   Period Weeks   Status New   Target Date 07/31/17     PT LONG TERM GOAL #2   Title Pt will improve Functional Gait Assessment to at least 22/30 for decreased fall risk.   Time 7   Period Weeks   Status New   Target Date 07/31/17     PT LONG TERM GOAL #3   Title 6 Minute walk test to be performed, with goal to be written as appropriate.   Time 7   Period Weeks   Status New   Target Date 07/31/17                 Plan - 06/17/17 2058    Clinical Impression Statement Pt is a 66 year old male with history of Parkinson's disease who presents to OP PT following PT screen in July 2018.  At that time, pt reported overall mobility slowing and changes in balance.  Pt presents to OP PT today with decreased balance, abnormal posture, postural instability, decreased timing and coordination with gait.  Pt will benefit from skilled PT to address the above stated deficits to decrease fall risk and to improve functional mobility and participation in community exercise.   History and Personal Factors relevant to plan of care: hx of dysphonia, non-Hodgkins lymphoma, no hx of falls   Clinical Presentation Stable   Clinical Presentation due to: hx of Parkinson's since 2015, at fall risk per FGA   Clinical Decision Making Low   Rehab Potential Good   PT Frequency 2x / week   PT Duration 6 weeks  plus 1x/wk (eval); total POC= 7 weeks   PT Treatment/Interventions ADLs/Self Care Home Management;Gait training;Functional mobility training;Therapeutic activities;Therapeutic exercise;Balance training;Neuromuscular re-education;Patient/family education   PT Next Visit Plan Initiate/revisit PWR! Moves in standing, sitting, quadruped (in conjunction with OT); provide info on [facility] cycling class; perform 6MWT and set goal   Consulted and Agree with Plan of Care Patient      Patient will benefit from skilled therapeutic intervention in order to improve the following deficits and impairments:  Abnormal gait, Decreased balance, Decreased mobility, Difficulty walking, Postural dysfunction  Visit Diagnosis: Other  abnormalities of gait and mobility  Abnormal posture  Other symptoms and signs involving the nervous system  Unsteadiness on feet        G-Codes - 07/13/17 01-13-58    Functional Assessment Tool Used (Outpatient Only) FGA 19/30, R side push and release >2 steps and regains balance    Functional Limitation Mobility: Walking and moving around   Mobility: Walking and Moving Around Current Status [phone]) At least 20 percent but less than 40 percent impaired, limited or restricted   Mobility: Walking and Moving Around Goal Status [phone]) At least 1 percent but less than 20 percent impaired, limited or restricted        Problem List Patient Active Problem List   Diagnosis Date Noted  . 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 Dysphagia, neurologic 07/27/2014  . Parkinson disease ([place]) 07/27/2014  . Dystonia 07/10/2014  . Low back pain 11/21/2011  . TRANSAMINASES, SERUM, ELEVATED 07/06/2008  . DIARRHEA 05/23/2008  . LYMPH NODE-ENLARGED 07/28/2007  . Non-Hodgkin lymphoma ([place]) 03/23/2007    Sierah Lacewell W. 13-Jul-2017, 9:26 PM  Frazier Butt., PT   [facility] [address] [place], [place], [address] Phone: [phone]   Fax:  [phone]  Name: [name] [name] MRN: [id number] Date of Birth: 10/19/1950

## 2017-06-30 ENCOUNTER — Ambulatory Visit: Payer: Medicare Other | Admitting: Physical Therapy

## 2017-06-30 ENCOUNTER — Ambulatory Visit: Payer: Medicare Other | Attending: Family Medicine | Admitting: Occupational Therapy

## 2017-06-30 ENCOUNTER — Encounter: Payer: Self-pay | Admitting: Physical Therapy

## 2017-06-30 DIAGNOSIS — R293 Abnormal posture: Secondary | ICD-10-CM

## 2017-06-30 DIAGNOSIS — R41840 Attention and concentration deficit: Secondary | ICD-10-CM

## 2017-06-30 DIAGNOSIS — R269 Unspecified abnormalities of gait and mobility: Secondary | ICD-10-CM | POA: Diagnosis not present

## 2017-06-30 DIAGNOSIS — R29818 Other symptoms and signs involving the nervous system: Secondary | ICD-10-CM

## 2017-06-30 DIAGNOSIS — R278 Other lack of coordination: Secondary | ICD-10-CM | POA: Diagnosis not present

## 2017-06-30 DIAGNOSIS — R2689 Other abnormalities of gait and mobility: Secondary | ICD-10-CM

## 2017-06-30 DIAGNOSIS — R251 Tremor, unspecified: Secondary | ICD-10-CM | POA: Diagnosis not present

## 2017-06-30 DIAGNOSIS — R2681 Unsteadiness on feet: Secondary | ICD-10-CM | POA: Diagnosis not present

## 2017-06-30 NOTE — Patient Instructions (Signed)
Coordination Exercises  Perform the following exercises for 20 minutes 1 times per day. Perform with both hand(s). Perform using big movements.   Flipping Cards: Place deck of cards on the table. Flip cards over by opening your hand big to grasp and then turn your palm up big, opening hand fully to release.  Deal cards: Hold 1/2 or whole deck in your hand. Use thumb to push card off top of deck with one big push.  Place card on tabletop. Then flick fingers (extend fingers) powerfully to slide card off table (can have chair/box below table to catch the cards).  Rotate ball with fingertips: Pick up with fingers/thumb and move as much as you can with each turn/movement (clockwise and counter-clockwise).  Toss ball from one hand to the other: Toss big/high.  Deliberately open with toss and deliberately close hand after catch.  Toss ball in the air and catch with the same hand: Toss big/high.  Deliberately open with toss and deliberately close hand after catch.  Rotate 2 golf balls in your hand: Both directions.  Pick up coins and stack one at a time: Pick up with big, intentional movements. Open hand before picking up coin.  Do not drag coin to the edge. (5-10 in a stack)  Pick up 5-10 coins one at a time and hold in palm. Then, move coins from palm to fingertips one at time and place in coin bank/container.  Practice writing: Slow down, write big, and focus on forming each letter.

## 2017-06-30 NOTE — Patient Instructions (Signed)
  Provided Sitting and Standing PWR! Exercises. Instructed to do at least one set each day, preferably one set in the morning and one in the evening.

## 2017-06-30 NOTE — Therapy (Signed)
[facility] [address] [place], [place], [address] Phone: [phone]   Fax:  [phone]  Physical Therapy Treatment  Patient Details  Name: [name] [name] MRN: [id number] Date of Birth: [birth year] Referring Provider: Alonza Bogus, DO  Encounter Date: 06/30/2017      PT End of Session - 06/30/17 1139    Visit Number 2   Number of Visits 13   Date for PT Re-Evaluation 08/16/17   Authorization Type Medicare Traditional primary, BCBS secondary-GCODE every 10th visit   PT Start Time 0800   PT Stop Time 0845   PT Time Calculation (min) 45 min   Activity Tolerance Patient tolerated treatment well   Behavior During Therapy [facility] for tasks assessed/performed      Past Medical History:  Diagnosis Date  . Cervical lymphadenopathy    right - followed my ENT  . Dystonia 1994   diagnosed in [place]  . Non Hodgkin's lymphoma ([place]) 2007   diffuse- 6 cycles of chemo with R CHOP Rituxan - last dose 10/08  . Parkinson disease ([place])    2015    Past Surgical History:  Procedure Laterality Date  . CHOLECYSTECTOMY, LAPAROSCOPIC    . EYE SURGERY     x2 as child  . PORT-A-CATH REMOVAL    . PORTACATH PLACEMENT    . SPLENECTOMY    . TONSILLECTOMY      There were no vitals filed for this visit.      Subjective Assessment - 06/30/17 0800    Subjective Denies changes. Needs motivation to get exercising. Doesn't really enjoy any type of exercise, but walking outdoors comes closest. Discussed opportunities for walking even with poor weather (?qualifies for [employer] at [employer], vs treadmill, vs large store).    Patient Stated Goals To regain motivation for exercises   Currently in Pain? No/denies                         [facility] Adult PT Treatment/Exercise - 06/30/17 0001      Ambulation/Gait   Ambulation/Gait Assistance 6: Modified independent (Device/Increase time)   Ambulation Distance (Feet) 250 Feet   120   Assistive device None   Gait Pattern Step-through pattern;Decreased arm swing - right;Trunk flexed;Decreased trunk rotation   Ambulation Surface Level     Posture/Postural Control   Posture/Postural Control Postural limitations   Postural Limitations Forward head;Rounded Shoulders;Posterior pelvic tilt   Posture Comments in sitting and standing worked on chin tuck to bring head back over his shoulders and bil scapular squeeze     Exercises   Exercises Other Exercises   Other Exercises  at steps, alternating step taps x 20; heel taps x 20, toe taps x 20 adding arm swing, taps to second step x 20           P[facility]) - 06/30/17 0820    PWR! exercises Moves in sitting   PWR! Up 20   PWR! Rock 20   PWR! Twist 20   PWR Step 20   Comments PWR Up required max verbal, visual and ultimately tactile cues for proper technique (hips hinge back and knees not passing his toes)   PWR! Up 20   PWR! Rock 20   PWR! Twist 20   PWR! Step 20   Comments vc for Upright posture, scapula squeeze and chin tuck          Balance Exercises - 06/30/17 1132  Balance Exercises: Standing   Step Over Hurdles / Cones over black beams, red beam, 1/2 roller with one step between for step length and foot clearance            PT Education - 06/30/17 1139    Education provided Yes   Education Details PWR! in sitting and standing;    Person(s) Educated Patient   Methods Explanation;Demonstration;Handout   Comprehension Verbalized understanding;Returned demonstration;Need further instruction          PT Short Term Goals - 06/17/17 2113      PT SHORT TERM GOAL #1   Title Pt will be independent with HEP for improved posture, balance, functional mobility.  TARGET 07/17/17   Time 5   Period Weeks   Status New   Target Date 07/17/17     PT SHORT TERM GOAL #2   Title Pt will verbalize understanding of fall prevention in home environment.   Time 5   Period Weeks   Status New    Target Date 07/17/17     PT SHORT TERM GOAL #3   Title Pt will improve push and release test to R side, to <2 steps independently regaining balance, for improved balance reactions.   Time 5   Period Weeks   Status New   Target Date 07/17/17           PT Long Term Goals - 06/17/17 2116      PT LONG TERM GOAL #1   Title Pt will verbalize plans for continued community fitness upon d/c from PT.  TARGET 07/31/17   Time 7   Period Weeks   Status New   Target Date 07/31/17     PT LONG TERM GOAL #2   Title Pt will improve Functional Gait Assessment to at least 22/30 for decreased fall risk.   Time 7   Period Weeks   Status New   Target Date 07/31/17     PT LONG TERM GOAL #3   Title 6 Minute walk test to be performed, with goal to be written as appropriate.   Time 7   Period Weeks   Status New   Target Date 07/31/17               Plan - 06/30/17 1140    Clinical Impression Statement Session focused on instruction in [place]! sitting and standing exercises for pt to begin using these as part of his HEP. Patient receptive to feedback for correct technique, EXCEPT for him to slow down and emphasize positions. Feels he needs to work on moving fast because his PD slows him down. Patient can continue to benefit from PT to work towards goals.    Rehab Potential Good   PT Frequency 2x / week   PT Duration 6 weeks  plus 1x/wk (eval); total POC= 7 weeks   PT Treatment/Interventions ADLs/Self Care Home Management;Gait training;Functional mobility training;Therapeutic activities;Therapeutic exercise;Balance training;Neuromuscular re-education;Patient/family education   PT Next Visit Plan perform 6MWT and set goal, revisit PWR! Moves in standing, sitting, (OT doing quadruped, ?supine); provide info on [facility] cycling class;    Consulted and Agree with Plan of Care Patient      Patient will benefit from skilled therapeutic intervention in order to improve the following deficits  and impairments:  Abnormal gait, Decreased balance, Decreased mobility, Difficulty walking, Postural dysfunction  Visit Diagnosis: Abnormal posture  Other symptoms and signs involving the nervous system  Other abnormalities of gait and mobility  Problem List Patient Active Problem List   Diagnosis Date Noted  . 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 Dysphagia, neurologic 07/27/2014  . Parkinson disease ([place]) 07/27/2014  . Dystonia 07/10/2014  . Low back pain 11/21/2011  . TRANSAMINASES, SERUM, ELEVATED 07/06/2008  . DIARRHEA 05/23/2008  . LYMPH NODE-ENLARGED 07/28/2007  . Non-Hodgkin lymphoma ([place]) 03/23/2007    Rexanne Mano, PT 06/30/2017, 11:45 AM  [facility] [address] [place], [place], [address] Phone: [phone]   Fax:  [phone]  Name: [name] [name] MRN: [id number] Date of Birth: [birth year]

## 2017-06-30 NOTE — Therapy (Signed)
[facility] [address] [place], [place], [address] Phone: [phone]   Fax:  [phone]  Occupational Therapy Treatment  Patient Details  Name: [name] [name] MRN: [id number] Date of Birth: 09-Oct-1950 Referring Provider: Dr. Wells Guiles Tat  Encounter Date: 06/30/2017      OT End of Session - 06/30/17 0828    Visit Number 2   Number of Visits 17   Date for OT Re-Evaluation 08/16/17   Authorization Type Medicare / BCBS;  needs G-code   Authorization - Visit Number 2   Authorization - Number of Visits 10   OT Start Time [phone]   OT Stop Time 0930   OT Time Calculation (min) 43 min   Activity Tolerance Patient tolerated treatment well   Behavior During Therapy [facility] for tasks assessed/performed      Past Medical History:  Diagnosis Date  . Cervical lymphadenopathy    right - followed my ENT  . Dystonia 1994   diagnosed in [place]  . Non Hodgkin's lymphoma ([place]) 2007   diffuse- 6 cycles of chemo with R CHOP Rituxan - last dose 10/08  . Parkinson disease ([place])    2015    Past Surgical History:  Procedure Laterality Date  . CHOLECYSTECTOMY, LAPAROSCOPIC    . EYE SURGERY     x2 as child  . PORT-A-CATH REMOVAL    . PORTACATH PLACEMENT    . SPLENECTOMY    . TONSILLECTOMY      There were no vitals filed for this visit.      Subjective Assessment - 06/30/17 0827    Subjective  Pt reports that this is helpful to review   Pertinent History Parkinson's disease, non-hodgkin lymphoma, dystonia, Meige syndrome   Patient Stated Goals to re-establish exercise routine and improve ADLs   Currently in Pain? No/denies         Began instruction regarding importance of quality of movement (large amplitude, head/eye movements, and deliberate movements with stop/start) to help prevent future complications.               OT Education - 06/30/17 0912    Education Details PWR! moves in quadraped (basic 4);  Coordination HEP with focus on large amplitude--see pt instructions   Person(s) Educated Patient   Methods Explanation;Handout;Verbal cues;Demonstration   Comprehension Verbalized understanding;Returned demonstration;Verbal cues required  cueing for timing, large amplitude movement, deliberate movement, posture          OT Short Term Goals - 06/17/17 1424      OT SHORT TERM GOAL #1   Title Pt will be independent with updated PD specific HEP--check STGs 07/16/17   Time 4   Period Weeks   Status New     OT SHORT TERM GOAL #2   Title Pt will be able to write at least 2 sentences with at least 75% legibility.   Baseline 25%   Time 4   Period Weeks   Status New     OT SHORT TERM GOAL #3   Title Pt will demonstrate increased LUE functional use as evidenced by increasing box/ blocks score by 4 blocks.   Baseline R-50, L-45 blocks   Time 4   Period Weeks   Status New     OT SHORT TERM GOAL #4   Title Pt will verbalize understanding of ways to prevent future complications and appropriate community resources.     OT SHORT TERM GOAL #5   Title ---------------  OT Long Term Goals - 06/17/17 1427      OT LONG TERM GOAL #1   Title Pt will verbalize understanding of large amplitude movement strategies to incr ease/efficiency with ADLs/IADLs.--check LTGs 08/16/17   Baseline --   Time 8   Period Weeks   Status New     OT LONG TERM GOAL #2   Title Pt will improve dominant R hand coordination for ADLs as shown by improving time on 9-hole peg test by at least 3sec.   Baseline R-28.81sec, L-24.75sec   Time 8   Period Weeks   Status New     OT LONG TERM GOAL #3   Title Pt will be able to write at least 3 sentences with at least 90% legibility.   Baseline 25%   Time 8   Period Weeks   Status New     OT LONG TERM GOAL #4   Title Pt will improve ability to fasten/unfasten 3 futtons in 22sec or less using BUEs.   Baseline 25.06sec with minimal use of R hand   Time  4   Period Weeks   Status New               Plan - 06/30/17 0909    Clinical Impression Statement Pt with hypokinesia and needs cueing for timing, but responds well to cueing.   Rehab Potential Good   OT Frequency 2x / week   OT Duration 8 weeks  +eval   OT Treatment/Interventions Self-care/ADL training;Moist Heat;Fluidtherapy;DME and/or AE instruction;Patient/family education;Balance training;Therapeutic exercises;Ultrasound;Therapeutic exercise;Therapeutic activities;Cognitive remediation/compensation;Passive range of motion;Neuromuscular education;Cryotherapy;Parrafin;Energy conservation;Manual Therapy;Functional Mobility Training   Plan PWR! hands, review coordination HEP   OT Home Exercise Plan Education provided:  PWR! moves in quadraped (basic 4), coordination HEP, PT issued PWR! in sitting/standing   Consulted and Agree with Plan of Care Patient      Patient will benefit from skilled therapeutic intervention in order to improve the following deficits and impairments:  Abnormal gait, Decreased coordination, Impaired flexibility, Impaired UE functional use, Decreased knowledge of use of DME, Decreased balance, Decreased mobility, Decreased strength, Improper spinal/pelvic alignment, Decreased cognition (bradykinesia)  Visit Diagnosis: Other symptoms and signs involving the nervous system  Abnormal posture  Unsteadiness on feet  Other lack of coordination  Other abnormalities of gait and mobility  Tremor, unspecified  Attention and concentration deficit    Problem List Patient Active Problem List   Diagnosis Date Noted  . 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 Dysphagia, neurologic 07/27/2014  . Parkinson disease ([place]) 07/27/2014  . Dystonia 07/10/2014  . Low back pain 11/21/2011  . TRANSAMINASES, SERUM, ELEVATED 07/06/2008  . DIARRHEA 05/23/2008  . LYMPH NODE-ENLARGED 07/28/2007  .  Non-Hodgkin lymphoma ([place]) 03/23/2007    [facility] 06/30/2017, 12:31 PM  [facility] [address] [place], [place], [address] Phone: [phone]   Fax:  [phone]  Name: [name] [name] MRN: [id number] Date of Birth: 06/06/1951   Vianne Bulls, OTR/L [facility] [address]. [place], [place]  [address] [phone] phone [phone] 06/30/17 12:31 PM

## 2017-07-01 ENCOUNTER — Encounter: Payer: Self-pay | Admitting: Physical Therapy

## 2017-07-01 ENCOUNTER — Ambulatory Visit: Payer: Medicare Other | Admitting: Occupational Therapy

## 2017-07-01 ENCOUNTER — Ambulatory Visit: Payer: Medicare Other | Admitting: Physical Therapy

## 2017-07-01 DIAGNOSIS — R29818 Other symptoms and signs involving the nervous system: Secondary | ICD-10-CM

## 2017-07-01 DIAGNOSIS — R278 Other lack of coordination: Secondary | ICD-10-CM | POA: Diagnosis not present

## 2017-07-01 DIAGNOSIS — R2689 Other abnormalities of gait and mobility: Secondary | ICD-10-CM

## 2017-07-01 DIAGNOSIS — R2681 Unsteadiness on feet: Secondary | ICD-10-CM | POA: Diagnosis not present

## 2017-07-01 DIAGNOSIS — R41840 Attention and concentration deficit: Secondary | ICD-10-CM

## 2017-07-01 DIAGNOSIS — R251 Tremor, unspecified: Secondary | ICD-10-CM | POA: Diagnosis not present

## 2017-07-01 DIAGNOSIS — R293 Abnormal posture: Secondary | ICD-10-CM | POA: Diagnosis not present

## 2017-07-01 NOTE — Therapy (Signed)
[facility] [address] [place], [place], [address] Phone: [phone]   Fax:  [phone]  Physical Therapy Treatment  Patient Details  Name: [name] [name] MRN: [id number] Date of Birth: [birth year] Referring Provider: Alonza Bogus, DO  Encounter Date: 07/01/2017      PT End of Session - 07/01/17 1002    Visit Number 3   Number of Visits 13   Date for PT Re-Evaluation 08/16/17   Authorization Type Medicare Traditional primary, BCBS secondary-GCODE every 10th visit   PT Start Time 0800   PT Stop Time 0843   PT Time Calculation (min) 43 min   Activity Tolerance Patient tolerated treatment well   Behavior During Therapy [facility] for tasks assessed/performed      Past Medical History:  Diagnosis Date  . Cervical lymphadenopathy    right - followed my ENT  . Dystonia 1994   diagnosed in [place]  . Non Hodgkin's lymphoma ([place]) 2007   diffuse- 6 cycles of chemo with R CHOP Rituxan - last dose 10/08  . Parkinson disease ([place])    2015    Past Surgical History:  Procedure Laterality Date  . CHOLECYSTECTOMY, LAPAROSCOPIC    . EYE SURGERY     x2 as child  . PORT-A-CATH REMOVAL    . PORTACATH PLACEMENT    . SPLENECTOMY    . TONSILLECTOMY      There were no vitals filed for this visit.      Subjective Assessment - 07/01/17 0800    Subjective A little sore from yesterday.    Patient Stated Goals To regain motivation for exercises   Currently in Pain? No/denies                         [facility] Adult PT Treatment/Exercise - 07/01/17 0805      Exercises   Exercises Knee/Hip;Shoulder   Other Exercises  supine cervical chin tuck with 1.5 inch towel under his head; repeated x 4 x 30 sec     Knee/Hip Exercises: Stretches   Active Hamstring Stretch Both;3 reps   Active Hamstring Stretch Limitations sitting x 1; supine x 2 (with towel behind knee)   Gastroc Stretch Both;3 reps;30 seconds   Soleus  Stretch Both;3 reps;30 seconds     Knee/Hip Exercises: Aerobic   Tread Mill 2.3 mph, 0.22 mi 6 min 40 sec     Shoulder Exercises: Stretch   Cross Chest Stretch 1 rep;60 seconds   Cross Chest Stretch Limitations with hands open wide            PWR ([facility]) - 07/01/17 1001    PWR! exercises Moves in sitting   PWR! Up 20   PWR! Rock 20   PWR! Twist 20   PWR! Step 20   Comments vc for slower speed and holding end/stretch positions; most difficulty with PWR step             PT Education - 07/01/17 1002    Education provided Yes   Education Details additions to HEP for flexibility   Person(s) Educated Patient   Methods Explanation;Demonstration;Verbal cues;Handout   Comprehension Verbalized understanding;Returned demonstration;Verbal cues required;Need further instruction          PT Short Term Goals - 06/17/17 2113      PT SHORT TERM GOAL #1   Title Pt will be independent with HEP for improved posture, balance, functional mobility.  TARGET 07/17/17   Time 5  Period Weeks   Status New   Target Date 07/17/17     PT SHORT TERM GOAL #2   Title Pt will verbalize understanding of fall prevention in home environment.   Time 5   Period Weeks   Status New   Target Date 07/17/17     PT SHORT TERM GOAL #3   Title Pt will improve push and release test to R side, to <2 steps independently regaining balance, for improved balance reactions.   Time 5   Period Weeks   Status New   Target Date 07/17/17           PT Long Term Goals - 06/17/17 2116      PT LONG TERM GOAL #1   Title Pt will verbalize plans for continued community fitness upon d/c from PT.  TARGET 07/31/17   Time 7   Period Weeks   Status New   Target Date 07/31/17     PT LONG TERM GOAL #2   Title Pt will improve Functional Gait Assessment to at least 22/30 for decreased fall risk.   Time 7   Period Weeks   Status New   Target Date 07/31/17     PT LONG TERM GOAL #3   Title 6 Minute walk test to  be performed, with goal to be written as appropriate.   Time 7   Period Weeks   Status New   Target Date 07/31/17               Plan - 07/01/17 1003    Clinical Impression Statement Session focused on initiating walking program, instruction in flexibility to improve his gait (improve ROM for ease of movement), and review of PWR! sitting exercises with min cues for technique. Patient states he feels better after exercising today. Continue with PT to work towards goals.    Rehab Potential Good   PT Frequency 2x / week   PT Duration 6 weeks  plus 1x/wk (eval); total POC= 7 weeks   PT Treatment/Interventions ADLs/Self Care Home Management;Gait training;Functional mobility training;Therapeutic activities;Therapeutic exercise;Balance training;Neuromuscular re-education;Patient/family education   PT Next Visit Plan perform 6MWT and set goal Jeani Hawking forgot twice!), revisit PWR! Moves in standing, sitting, (OT doing quadruped, ?supine); provide info on [facility] cycling class;    Consulted and Agree with Plan of Care Patient      Patient will benefit from skilled therapeutic intervention in order to improve the following deficits and impairments:  Abnormal gait, Decreased balance, Decreased mobility, Difficulty walking, Postural dysfunction  Visit Diagnosis: Other symptoms and signs involving the nervous system  Abnormal posture  Other abnormalities of gait and mobility     Problem List Patient Active Problem List   Diagnosis Date Noted  . 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 Dysphagia, neurologic 07/27/2014  . Parkinson disease ([place]) 07/27/2014  . Dystonia 07/10/2014  . Low back pain 11/21/2011  . TRANSAMINASES, SERUM, ELEVATED 07/06/2008  . DIARRHEA 05/23/2008  . LYMPH NODE-ENLARGED 07/28/2007  . Non-Hodgkin lymphoma ([place]) 03/23/2007    Rexanne Mano, PT 07/01/2017, 10:06 AM  [facility] [address] [place], [place], [address] Phone: [phone]   Fax:  [phone]  Name: [name] [name] MRN: [id number] Date of Birth: Jan 23, 1951

## 2017-07-01 NOTE — Patient Instructions (Signed)
Hamstring Stretch    With other leg bent, foot flat, grasp right leg (can use a pillow case or towel behind your knee to hold on to) and slowly try to straighten knee. Hold __30__ seconds. Repeat _3___ times on each leg. Do _1___ sessions per day.  [email or web address].us/279   HIP: Hamstrings - Short Sitting    Rest leg on raised surface or on the floor. Keep knee straight. Lift chest and lean forward until you feel a stretch down the back of your leg. Hold __30_ seconds. __3_ reps per set, _1__ sets per day, __5_ days per week  Copyright  VHI. All rights reserved.    Suboccipital Stretch (Supine)    With small towel roll at base of skull and upper neck (or can do without the roll). Gently tuck chin and lengthen the back of your neck until stretch is felt at base of skull and upper neck. Hold __30__ seconds. Relax. Repeat __3__ times per set. Do __1__ sets per session.   http://orth.exer.us/984   Copyright  VHI. All rights reserved.

## 2017-07-01 NOTE — Therapy (Signed)
[facility] [address] [place], [place], [address] Phone: [phone]   Fax:  [phone]  Occupational Therapy Treatment  Patient Details  Name: [name] [name] MRN: [id number] Date of Birth: 08-Aug-1951 Referring Provider: Dr. Wells Guiles Tat  Encounter Date: 07/01/2017      OT End of Session - 07/01/17 0858    Visit Number 3   Number of Visits 17   Date for OT Re-Evaluation 08/16/17   Authorization Type Medicare / BCBS;  needs G-code   Authorization - Visit Number 3   Authorization - Number of Visits 10   OT Start Time [phone]   OT Stop Time 0930   OT Time Calculation (min) 38 min   Activity Tolerance Patient tolerated treatment well   Behavior During Therapy [facility] for tasks assessed/performed      Past Medical History:  Diagnosis Date  . Cervical lymphadenopathy    right - followed my ENT  . Dystonia 1994   diagnosed in [place]  . Non Hodgkin's lymphoma ([place]) 2007   diffuse- 6 cycles of chemo with R CHOP Rituxan - last dose 10/08  . Parkinson disease ([place])    2015    Past Surgical History:  Procedure Laterality Date  . CHOLECYSTECTOMY, LAPAROSCOPIC    . EYE SURGERY     x2 as child  . PORT-A-CATH REMOVAL    . PORTACATH PLACEMENT    . SPLENECTOMY    . TONSILLECTOMY      There were no vitals filed for this visit.      Subjective Assessment - 07/01/17 0855    Subjective  little sore all over from exercise yesterday, starts PWR! class today   Pertinent History Parkinson's disease, non-hodgkin lymphoma, dystonia, Meige syndrome   Patient Stated Goals to re-establish exercise routine and improve ADLs   Currently in Pain? No/denies        Sliding cards across table using PWR! Hands with min difficulty with R hand and only occasional difficulty with [name].  Fastening/unfastening buttons with use of large amplitude movement strategies and PWR! Hands with min cueing.  Pt demo improvement with use of  strategies.  Writing/copying grocery list with cueing for use of large amplitude movement strategies, slowing down, individual letter formation, and PWR! Hands.  Pt demo improvement with slowing down.  Trial of built-up grip, but did not appear to be helpful.  Also discussed using opportunities within ADLs to practice writing.  Pt reports difficulty thinking while writing.  Recommended pt think about what he wants to write before attempting to write to reduce dual task as pt demo decr legibility with dual task.                    OT Education - 07/01/17 1612    Education Details PWR! hands  (basic 4)   Person(s) Educated Patient   Methods Explanation;Demonstration;Handout;Verbal cues   Comprehension Returned demonstration;Verbalized understanding;Verbal cues required          OT Short Term Goals - 06/17/17 1424      OT SHORT TERM GOAL #1   Title Pt will be independent with updated PD specific HEP--check STGs 07/16/17   Time 4   Period Weeks   Status New     OT SHORT TERM GOAL #2   Title Pt will be able to write at least 2 sentences with at least 75% legibility.   Baseline 25%   Time 4   Period Weeks   Status  New     OT SHORT TERM GOAL #3   Title Pt will demonstrate increased LUE functional use as evidenced by increasing box/ blocks score by 4 blocks.   Baseline R-50, L-45 blocks   Time 4   Period Weeks   Status New     OT SHORT TERM GOAL #4   Title Pt will verbalize understanding of ways to prevent future complications and appropriate community resources.     OT SHORT TERM GOAL #5   Title ---------------           OT Long Term Goals - 06/17/17 1427      OT LONG TERM GOAL #1   Title Pt will verbalize understanding of large amplitude movement strategies to incr ease/efficiency with ADLs/IADLs.--check LTGs 08/16/17   Baseline --   Time 8   Period Weeks   Status New     OT LONG TERM GOAL #2   Title Pt will improve dominant R hand coordination for  ADLs as shown by improving time on 9-hole peg test by at least 3sec.   Baseline R-28.81sec, L-24.75sec   Time 8   Period Weeks   Status New     OT LONG TERM GOAL #3   Title Pt will be able to write at least 3 sentences with at least 90% legibility.   Baseline 25%   Time 8   Period Weeks   Status New     OT LONG TERM GOAL #4   Title Pt will improve ability to fasten/unfasten 3 futtons in 22sec or less using BUEs.   Baseline 25.06sec with minimal use of R hand   Time 4   Period Weeks   Status New               Plan - 07/01/17 0859    Clinical Impression Statement Pt continues to need cueing to slow down, but responds well to cues if not distracted.   Rehab Potential Good   OT Frequency 2x / week   OT Duration 8 weeks  +eval   OT Treatment/Interventions Self-care/ADL training;Moist Heat;Fluidtherapy;DME and/or AE instruction;Patient/family education;Balance training;Therapeutic exercises;Ultrasound;Therapeutic exercise;Therapeutic activities;Cognitive remediation/compensation;Passive range of motion;Neuromuscular education;Cryotherapy;Parrafin;Energy conservation;Manual Therapy;Functional Mobility Training   Plan review coordination HEP, PWR! supine    OT Home Exercise Plan Education provided:  PWR! moves in quadraped (basic 4), coordination HEP, PT issued PWR! in sitting/standing   Consulted and Agree with Plan of Care Patient      Patient will benefit from skilled therapeutic intervention in order to improve the following deficits and impairments:  Abnormal gait, Decreased coordination, Impaired flexibility, Impaired UE functional use, Decreased knowledge of use of DME, Decreased balance, Decreased mobility, Decreased strength, Improper spinal/pelvic alignment, Decreased cognition (bradykinesia)  Visit Diagnosis: Other symptoms and signs involving the nervous system  Abnormal posture  Other abnormalities of gait and mobility  Unsteadiness on feet  Other lack of  coordination  Tremor, unspecified  Attention and concentration deficit    Problem List Patient Active Problem List   Diagnosis Date Noted  . 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 Dysphagia, neurologic 07/27/2014  . Parkinson disease ([place]) 07/27/2014  . Dystonia 07/10/2014  . Low back pain 11/21/2011  . TRANSAMINASES, SERUM, ELEVATED 07/06/2008  . DIARRHEA 05/23/2008  . LYMPH NODE-ENLARGED 07/28/2007  . Non-Hodgkin lymphoma ([place]) 03/23/2007    [facility] 07/01/2017, 4:12 PM  [facility] [address] [place], [place], [address] Phone: [phone]  Fax:  [phone]  Name: [name] [name] MRN: [id number] Date of Birth: 1951-06-23   Vianne Bulls, OTR/L [facility] [address] [place], [place]  [address] [phone] phone [phone] 07/01/17 4:13 PM

## 2017-07-01 NOTE — Patient Instructions (Signed)
PWR! Hand Exercises  Then, start with elbows bent and hands closed:   PWR! Hands: Push hands out BIG. Elbows straight, wrists up, fingers open and spread apart BIG. (Can also perform by pushing down on table, chair, knees. Push above head, out to the side, behind you, in front of you.)   PWR! Step: Touch index finger to thumb while keeping other fingers straight. Flick fingers out BIG (thumb out/straighten fingers). Repeat with other fingers. (Step your thumb to each finger).   With arms stretched out in front of you (elbows straight), perform the following:   PWR! Rock:  Move wrists up and down [employer]! Twist: Twist palms up and down BIG    ** Make each movement big and deliberate so that you feel the movement.  Perform at least 10 repetitions 1x/day, but perform PWR! Hands throughout the day when you are having trouble using your hands (picking up/manipulating small objects, writing, eating, typing, sewing, buttoning, etc.).

## 2017-07-02 ENCOUNTER — Ambulatory Visit (INDEPENDENT_AMBULATORY_CARE_PROVIDER_SITE_OTHER): Payer: Medicare Other | Admitting: Family Medicine

## 2017-07-02 ENCOUNTER — Encounter: Payer: Self-pay | Admitting: Family Medicine

## 2017-07-02 VITALS — BP 104/68 | HR 93 | Temp 98.6°F | Ht 66.0 in | Wt 169.0 lb

## 2017-07-02 DIAGNOSIS — Z9189 Other specified personal risk factors, not elsewhere classified: Secondary | ICD-10-CM

## 2017-07-02 DIAGNOSIS — E785 Hyperlipidemia, unspecified: Secondary | ICD-10-CM

## 2017-07-02 DIAGNOSIS — Z0000 Encounter for general adult medical examination without abnormal findings: Secondary | ICD-10-CM | POA: Diagnosis not present

## 2017-07-02 DIAGNOSIS — Z23 Encounter for immunization: Secondary | ICD-10-CM | POA: Diagnosis not present

## 2017-07-02 LAB — COMPREHENSIVE METABOLIC PANEL
ALT: 34 U/L (ref 0–53)
AST: 22 U/L (ref 0–37)
Albumin: 4.3 g/dL (ref 3.5–5.2)
Alkaline Phosphatase: 55 U/L (ref 39–117)
BILIRUBIN TOTAL: 0.8 mg/dL (ref 0.2–1.2)
BUN: 13 mg/dL (ref 6–23)
CALCIUM: 8.7 mg/dL (ref 8.4–10.5)
CO2: 31 meq/L (ref 19–32)
CREATININE: 1.03 mg/dL (ref 0.40–1.50)
Chloride: 104 mEq/L (ref 96–112)
GFR: 76.79 mL/min (ref >60.00)
Glucose, Bld: 98 mg/dL (ref 70–99)
Potassium: 3.9 mEq/L (ref 3.5–5.1)
Sodium: 140 mEq/L (ref 135–145)
Total Protein: 6.8 g/dL (ref 6.0–8.3)

## 2017-07-02 LAB — LIPID PANEL
CHOLESTEROL: 169 mg/dL (ref 0–200)
HDL: 50.1 mg/dL (ref >39.00)
LDL Cholesterol: 98 mg/dL (ref 0–99)
NonHDL: 118.51
TRIGLYCERIDES: 103 mg/dL (ref 0.0–149.0)
Total CHOL/HDL Ratio: 3
VLDL: 20.6 mg/dL (ref 0.0–40.0)

## 2017-07-02 NOTE — Patient Instructions (Signed)
Healthy Eating Plan Many factors influence your heart health, including eating and exercise habits. Heart (coronary) risk increases with abnormal blood fat (lipid) levels. Heart-healthy meal planning includes limiting unhealthy fats, increasing healthy fats, and making other small dietary changes. This includes maintaining a healthy body weight to help keep lipid levels within a normal range.  WHAT IS MY PLAN?  Your health care provider recommends that you:  Drink a glass of water before meals to help with satiety.  Eat slowly.  An alternative to the water is to add Metamucil. This will help with satiety as well. It does contain calories, unlike water.  WHAT TYPES OF FAT SHOULD I CHOOSE?  Choose healthy fats more often. Choose monounsaturated and polyunsaturated fats, such as olive oil and canola oil, flaxseeds, walnuts, almonds, and seeds.  Eat more omega-3 fats. Good choices include salmon, mackerel, sardines, tuna, flaxseed oil, and ground flaxseeds. Aim to eat fish at least two times each week.  Avoid foods with partially hydrogenated oils in them. These contain trans fats. Examples of foods that contain trans fats are stick margarine, some tub margarines, cookies, crackers, and other baked goods. If you are going to avoid a fat, this is the one to avoid!  WHAT GENERAL GUIDELINES DO I NEED TO FOLLOW?  Check food labels carefully to identify foods with trans fats. Avoid these types of options when possible.  Fill one half of your plate with vegetables and green salads. Eat 4-5 servings of vegetables per day. A serving of vegetables equals 1 cup of raw leafy vegetables,  cup of raw or cooked cut-up vegetables, or  cup of vegetable juice.  Fill one fourth of your plate with whole grains. Look for the word "whole" as the first word in the ingredient list.  Fill one fourth of your plate with lean protein foods.  Eat 4-5 servings of fruit per day. A serving of fruit equals one medium  whole fruit,  cup of dried fruit,  cup of fresh, frozen, or canned fruit. Try to avoid fruits in cups/syrups as the sugar content can be high.  Eat more foods that contain soluble fiber. Examples of foods that contain this type of fiber are apples, broccoli, carrots, beans, peas, and barley. Aim to get 20-30 g of fiber per day.  Eat more home-cooked food and less restaurant, buffet, and fast food.  Limit or avoid alcohol.  Limit foods that are high in starch and sugar.  Avoid fried foods when able.  Cook foods by using methods other than frying. Baking, boiling, grilling, and broiling are all great options. Other fat-reducing suggestions include: ? Removing the skin from poultry. ? Removing all visible fats from meats. ? Skimming the fat off of stews, soups, and gravies before serving them. ? Steaming vegetables in water or broth.  Lose weight if you are overweight. Losing just 5-10% of your initial body weight can help your overall health and prevent diseases such as diabetes and heart disease.  Increase your consumption of nuts, legumes, and seeds to 4-5 servings per week. One serving of dried beans or legumes equals  cup after being cooked, one serving of nuts equals 1 ounces, and one serving of seeds equals  ounce or 1 tablespoon.  WHAT ARE GOOD FOODS CAN I EAT? Grains Grainy breads (try to find bread that is 3 g of fiber per slice or greater), oatmeal, light popcorn. Whole-grain cereals. Rice and pasta, including brown rice and those that are made with whole wheat.  Edamame pasta is a great alternative to grain pasta. It has a higher protein content. Try to avoid significant consumption of white bread, sugary cereals, or pastries/baked goods.  Vegetables All vegetables. Cooked white potatoes do not count as vegetables.  Fruits All fruits, but limit pineapple and bananas as these fruits have a higher sugar content.  Meats and Other Protein Sources Lean, well-trimmed beef,  veal, pork, and lamb. Chicken and [place] without skin. All fish and shellfish. Wild duck, rabbit, pheasant, and venison. Egg whites or low-cholesterol egg substitutes. Dried beans, peas, lentils, and tofu.Seeds and most nuts.  Dairy Low-fat or nonfat cheeses, including ricotta, string, and mozzarella. Skim or 1% milk that is liquid, powdered, or evaporated. Buttermilk that is made with low-fat milk. Nonfat or low-fat yogurt. Soy/Almond milk are good alternatives if you cannot handle dairy.  Beverages Water is the best for you. Sports drinks with less sugar are more desirable unless you are a highly active athlete.  Sweets and Desserts Sherbets and fruit ices. Honey, jam, marmalade, jelly, and syrups. Dark chocolate.  Eat all sweets and desserts in moderation.  Fats and Oils Nonhydrogenated (trans-free) margarines. Vegetable oils, including soybean, sesame, sunflower, olive, peanut, safflower, corn, canola, and cottonseed. Salad dressings or mayonnaise that are made with a vegetable oil. Limit added fats and oils that you use for cooking, baking, salads, and as spreads.  Other Cocoa powder. Coffee and tea. Most condiments.  The items listed above may not be a complete list of recommended foods or beverages. Contact your dietitian for more options.

## 2017-07-02 NOTE — Progress Notes (Signed)
Pre visit review using our clinic review tool, if applicable. No additional management support is needed unless otherwise documented below in the visit note. 

## 2017-07-02 NOTE — Progress Notes (Signed)
Subjective:    [name] [name] is a 66 y.o. male who presents for Medicare Initial preventive examination.   Preventive Screening-Counseling & Management  Tobacco History  Smoking Status  . Never Smoker  Smokeless Tobacco  . Never Used    Problems Prior to Visit 1. Parkinson's Disease 2. Dysphagia 3. Elevated 10 yr CVD  Current Problems (verified) Patient Active Problem List   Diagnosis Date Noted  . 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 Dysphagia, neurologic 07/27/2014  . Parkinson disease ([place]) 07/27/2014  . Dystonia 07/10/2014  . Low back pain 11/21/2011  . TRANSAMINASES, SERUM, ELEVATED 07/06/2008  . DIARRHEA 05/23/2008  . LYMPH NODE-ENLARGED 07/28/2007  . Non-Hodgkin lymphoma ([place]) 03/23/2007    Medications Prior to Visit Current Outpatient Prescriptions on File Prior to Visit  Medication Sig Dispense Refill  . aspirin 81 MG chewable tablet Chew 81 mg by mouth daily.    . Cholecalciferol (VITAMIN D3) 3000 UNITS TABS Take 1,000 Units by mouth.     . cyanocobalamin 100 MCG tablet Take 1,000 mcg by mouth daily.     . Melatonin 3 MG TABS Take by mouth at bedtime.    . Pramipexole Dihydrochloride 1.5 MG TB24 Take 1 tablet (1.5 mg total) by mouth daily. 90 tablet 1    Current Medications (verified) Current Outpatient Prescriptions  Medication Sig Dispense Refill  . aspirin 81 MG chewable tablet Chew 81 mg by mouth daily.    . Cholecalciferol (VITAMIN D3) 3000 UNITS TABS Take 1,000 Units by mouth.     . cyanocobalamin 100 MCG tablet Take 1,000 mcg by mouth daily.     . Melatonin 3 MG TABS Take by mouth at bedtime.    . Pramipexole Dihydrochloride 1.5 MG TB24 Take 1 tablet (1.5 mg total) by mouth daily. 90 tablet 1    Allergies (verified) Patient has no known allergies.   PAST HISTORY  Family History Family History  Problem Relation Age of Onset  . Heart disease Mother   . Cancer Mother       lung  . Heart disease Father     Social History Social History  Substance Use Topics  . Smoking status: Never Smoker  . Smokeless tobacco: Never Used  . Alcohol use No    Are there smokers in your home (other than you)?  No  Risk Factors Current exercise habits: PT/OT for Parkinson's  Dietary issues discussed: portions; doing well otherwise   Cardiac risk factors: advanced age (older than 25 for men, 35 for women) and male gender.  Depression Screen (Note: if answer to either of the following is "Yes", a more complete depression screening is indicated)   Q1: Over the past two weeks, have you felt down, depressed or hopeless? No  Q2: Over the past two weeks, have you felt little interest or pleasure in doing things? No  Have you lost interest or pleasure in daily life? No  Do you often feel hopeless? No  Do you cry easily over simple problems? No  Activities of Daily Living In your present state of health, do you have any difficulty performing the following activities?:  Driving? No Managing money?  No Feeding yourself? No Getting from bed to chair? No Climbing a flight of stairs? No Preparing food and eating?: No Bathing or showering? No Getting dressed: No Getting to the toilet? No Using the toilet:No Moving around from place to place: Sometimes due to Parkinson's In the past year have  you fallen or had a near fall?:No  Are you sexually active?  Yes  Do you have more than one partner?  No  Hearing Difficulties: No Do you often ask people to speak up or repeat themselves? No Do you experience ringing or noises in your ears? No Do you have difficulty understanding soft or whispered voices? No   Do you feel that you have a problem with memory? No  Do you often misplace items? No  Do you feel safe at home?  No  Cognitive Testing  Alert? Yes  Normal Appearance?Yes  Oriented to person? Yes  Place? Yes   Time? Yes  Recall of three objects?  Yes  Can perform  simple calculations? Yes  Displays appropriate judgment?Yes  Can read the correct time from a watch face?Yes  Advanced Directives have been discussed with the patient? Yes   List the Names of Other Physician/Practitioners you currently use: 1.  Dr Wells Guiles Tat, Neurology 2.  Dr. Zenovia Jarred, Gastroenterology (colonoscopy)  Indicate any recent Medical Services you may have received from other than [facility] providers in the past year (date may be approximate).  Immunization History  Administered Date(s) Administered  . Influenza Whole 09/06/2007, 06/28/2008  . Influenza, High Dose Seasonal PF 06/20/2016  . Influenza,inj,Quad PF,6+ Mos 07/10/2014, 06/11/2015  . Meningococcal B, OMV 04/02/2015  . Meningococcal Conjugate 04/02/2015, 06/11/2015  . Pneumococcal Conjugate-13 04/02/2015  . Pneumococcal Polysaccharide-23 06/23/2006, 07/01/2016  . Td 05/04/2006, 06/20/2016    Screening Tests Health Maintenance  Topic Date Due  . Hepatitis C Screening  [birth year]  . INFLUENZA VACCINE  04/22/2017  . COLONOSCOPY  06/04/2025  . TETANUS/TDAP  06/20/2026  . PNA vac Low Risk Adult  Completed    All answers were reviewed with the patient and necessary referrals were made:  [name], DO   07/02/2017   History reviewed: allergies, current medications, past family history, past medical history, past social history, past surgical history and problem list  Review of Systems A comprehensive review of systems was negative except for: tremor    Objective:     Vision by Snellen chart: right eye:20/20, left eye:20/20 Blood pressure 104/68, pulse 93, temperature 98.6 F (37 C), temperature source Oral, height 5\' 6"  (1.676 m), weight 169 lb (76.7 kg), SpO2 93 %. Body mass index is 27.28 kg/m.  BP 104/68 (BP Location: Left Arm, Patient Position: Sitting, Cuff Size: Normal)   Pulse 93   Temp 98.6 F (37 C) (Oral)   Ht 5\' 6"  (1.676 m)   Wt 169 lb (76.7 kg)   SpO2 93%   BMI 27.28  kg/m   General Appearance:    Alert, cooperative, no distress, appears stated age  Head:    Normocephalic, without obvious abnormality, atraumatic  Eyes:    PERRL, conjunctiva/corneas clear, EOM's intact, fundi    benign, both eyes       Ears:    Normal TM's and external ear canals, both ears  Nose:   Nares normal, septum midline, mucosa normal, no drainage    or sinus tenderness  Throat:   Lips, mucosa, and tongue normal; teeth and gums normal  Neck:   Supple, symmetrical, trachea midline, no adenopathy;       thyroid:  No enlargement/tenderness/nodules; no carotid   bruit or JVD  Back:     Symmetric, no curvature, ROM normal, no CVA tenderness  Lungs:     Clear to auscultation bilaterally, respirations unlabored  Chest wall:    No  tenderness or deformity  Heart:    Regular rate and rhythm, S1 and S2 normal, no murmur, rub   or gallop  Abdomen:     Soft, non-tender, bowel sounds active all four quadrants,    no masses, no organomegaly  Genitalia:    Normal male without lesion, discharge or tenderness  Rectal:    Normal tone, normal prostate, no masses or tenderness;   guaiac negative stool  Extremities:   Extremities normal, atraumatic, no cyanosis or edema  Pulses:   2+ and symmetric all extremities  Skin:   Skin color, texture, turgor normal, no rashes or lesions  Lymph nodes:   Cervical, supraclavicular, and axillary nodes normal  Neurologic:   CNII-XII intact. Normal strength, sensation and reflexes      Throughout; +resting tremor        Assessment:     Plan:     During the course of the visit the patient was educated and counseled about appropriate screening and preventive services including:    Influenza vaccine; appears he was incorrectly given PCV13 in 2016, will have to wait for final dose in 2021.   Diet review for nutrition referral? Not Indicated  Recheck labs given elevated cvd, picture of ear taken, seems like seb hyperplasia vs SK vs other?  Patient  Instructions (the written plan) was given to the patient.  Medicare Attestation I have personally reviewed: The patient's medical and social history Their use of alcohol, tobacco or illicit drugs Their current medications and supplements The patient's functional ability including ADLs,fall risks, home safety risks, cognitive, and hearing and visual impairment Diet and physical activities Evidence for depression or mood disorders  The patient's weight, height, BMI, and visual acuity have been recorded in the chart.  I have made referrals, counseling, and provided education to the patient based on review of the above and I have provided the patient with a written personalized care plan for preventive services.     [address], DO   07/02/2017

## 2017-07-06 ENCOUNTER — Ambulatory Visit: Payer: Medicare Other | Admitting: Physical Therapy

## 2017-07-06 ENCOUNTER — Ambulatory Visit: Payer: Medicare Other | Admitting: Occupational Therapy

## 2017-07-06 DIAGNOSIS — R29818 Other symptoms and signs involving the nervous system: Secondary | ICD-10-CM | POA: Diagnosis not present

## 2017-07-06 DIAGNOSIS — R2681 Unsteadiness on feet: Secondary | ICD-10-CM | POA: Diagnosis not present

## 2017-07-06 DIAGNOSIS — R278 Other lack of coordination: Secondary | ICD-10-CM | POA: Diagnosis not present

## 2017-07-06 DIAGNOSIS — R293 Abnormal posture: Secondary | ICD-10-CM | POA: Diagnosis not present

## 2017-07-06 DIAGNOSIS — R2689 Other abnormalities of gait and mobility: Secondary | ICD-10-CM

## 2017-07-06 DIAGNOSIS — R41840 Attention and concentration deficit: Secondary | ICD-10-CM

## 2017-07-06 DIAGNOSIS — R251 Tremor, unspecified: Secondary | ICD-10-CM | POA: Diagnosis not present

## 2017-07-06 NOTE — Therapy (Signed)
[facility] [address] [place], [place], [address] Phone: [phone]   Fax:  [phone]  Occupational Therapy Treatment  Patient Details  Name: [name] [name] MRN: [id number] Date of Birth: 24-May-1951 Referring Provider: Dr. Wells Guiles Tat  Encounter Date: 07/06/2017      OT End of Session - 07/06/17 0943    Visit Number 4   Number of Visits 17   Date for OT Re-Evaluation 08/16/17   Authorization Type Medicare / BCBS;  needs G-code   Authorization - Visit Number 4   Authorization - Number of Visits 10   OT Start Time 0935   OT Stop Time 1015   OT Time Calculation (min) 40 min   Activity Tolerance Patient tolerated treatment well   Behavior During Therapy [facility] for tasks assessed/performed      Past Medical History:  Diagnosis Date  . Cervical lymphadenopathy    right - followed my ENT  . Dystonia 1994   diagnosed in [place]  . Non Hodgkin's lymphoma ([place]) 2007   diffuse- 6 cycles of chemo with R CHOP Rituxan - last dose 10/08  . Parkinson disease ([place])    2015    Past Surgical History:  Procedure Laterality Date  . CHOLECYSTECTOMY, LAPAROSCOPIC    . EYE SURGERY     x2 as child  . PORT-A-CATH REMOVAL    . PORTACATH PLACEMENT    . SPLENECTOMY    . TONSILLECTOMY      There were no vitals filed for this visit.      Subjective Assessment - 07/06/17 0942    Subjective  Went to class last week.  Was very tired   Pertinent History Parkinson's disease, non-hodgkin lymphoma, dystonia, Meige syndrome   Patient Stated Goals to re-establish exercise routine and improve ADLs   Currently in Pain? No/denies        PWR! Moves (basic 4) in supine x 20 each with min cues For incr movement amplitude, deliberate movements, timing.  Reviewed coordination HEP.  Pt returned demo each with occasional min v.c. For incr movement amplitude/technique.                      OT Education - 07/06/17  1300    Education Details Reviewed supine PWR! moves (basic 4); Coordination HEP   Person(s) Educated Patient   Methods Explanation;Demonstration;Verbal cues   Comprehension Verbalized understanding;Returned demonstration;Verbal cues required          OT Short Term Goals - 06/17/17 1424      OT SHORT TERM GOAL #1   Title Pt will be independent with updated PD specific HEP--check STGs 07/16/17   Time 4   Period Weeks   Status New     OT SHORT TERM GOAL #2   Title Pt will be able to write at least 2 sentences with at least 75% legibility.   Baseline 25%   Time 4   Period Weeks   Status New     OT SHORT TERM GOAL #3   Title Pt will demonstrate increased LUE functional use as evidenced by increasing box/ blocks score by 4 blocks.   Baseline R-50, L-45 blocks   Time 4   Period Weeks   Status New     OT SHORT TERM GOAL #4   Title Pt will verbalize understanding of ways to prevent future complications and appropriate community resources.     OT SHORT TERM GOAL #5   Title ---------------  OT Long Term Goals - 06/17/17 1427      OT LONG TERM GOAL #1   Title Pt will verbalize understanding of large amplitude movement strategies to incr ease/efficiency with ADLs/IADLs.--check LTGs 08/16/17   Baseline --   Time 8   Period Weeks   Status New     OT LONG TERM GOAL #2   Title Pt will improve dominant R hand coordination for ADLs as shown by improving time on 9-hole peg test by at least 3sec.   Baseline R-28.81sec, L-24.75sec   Time 8   Period Weeks   Status New     OT LONG TERM GOAL #3   Title Pt will be able to write at least 3 sentences with at least 90% legibility.   Baseline 25%   Time 8   Period Weeks   Status New     OT LONG TERM GOAL #4   Title Pt will improve ability to fasten/unfasten 3 futtons in 22sec or less using BUEs.   Baseline 25.06sec with minimal use of R hand   Time 4   Period Weeks   Status New               Plan -  07/06/17 0943    Clinical Impression Statement Pt is progressing well towards goals.  Continues to need cueing initially for timing/to slow down, but improves with cues.   Rehab Potential Good   OT Frequency 2x / week   OT Duration 8 weeks  +eval   OT Treatment/Interventions Self-care/ADL training;Moist Heat;Fluidtherapy;DME and/or AE instruction;Patient/family education;Balance training;Therapeutic exercises;Ultrasound;Therapeutic exercise;Therapeutic activities;Cognitive remediation/compensation;Passive range of motion;Neuromuscular education;Cryotherapy;Parrafin;Energy conservation;Manual Therapy;Functional Mobility Training   Plan prone PWR! review, coordination, step and reach   OT Home Exercise Plan Education provided:  PWR! moves in quadraped (basic 4), coordination HEP, PT issued PWR! in sitting/standing   Consulted and Agree with Plan of Care Patient      Patient will benefit from skilled therapeutic intervention in order to improve the following deficits and impairments:  Abnormal gait, Decreased coordination, Impaired flexibility, Impaired UE functional use, Decreased knowledge of use of DME, Decreased balance, Decreased mobility, Decreased strength, Improper spinal/pelvic alignment, Decreased cognition (bradykinesia)  Visit Diagnosis: Other symptoms and signs involving the nervous system  Abnormal posture  Other abnormalities of gait and mobility  Unsteadiness on feet  Other lack of coordination  Tremor, unspecified  Attention and concentration deficit    Problem List Patient Active Problem List   Diagnosis Date Noted  . 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 Dysphagia, neurologic 07/27/2014  . Parkinson disease ([place]) 07/27/2014  . Dystonia 07/10/2014  . Low back pain 11/21/2011  . TRANSAMINASES, SERUM, ELEVATED 07/06/2008  . DIARRHEA 05/23/2008  . LYMPH NODE-ENLARGED 07/28/2007  . Non-Hodgkin  lymphoma ([place]) 03/23/2007    [facility] 07/06/2017, 1:01 PM  [facility] [address] [place], [place], [address] Phone: [phone]   Fax:  [phone]  Name: [name] [name] MRN: [id number] Date of Birth: 20-Dec-1950   Vianne Bulls, OTR/L [facility] [address] [place], [place]  [address] [phone] phone [phone] 07/06/17 1:01 PM

## 2017-07-06 NOTE — Therapy (Signed)
[facility] [address] [place], [place], [address] Phone: [phone]   Fax:  [phone]  Physical Therapy Treatment  Patient Details  Name: [name] [name] MRN: [id number] Date of Birth: [birth year] Referring Provider: Alonza Bogus, DO  Encounter Date: 07/06/2017      PT End of Session - 07/06/17 1133    Visit Number 4   Number of Visits 13   Date for PT Re-Evaluation 08/16/17   Authorization Type Medicare Traditional primary, BCBS secondary-GCODE every 10th visit   PT Start Time 1020   PT Stop Time 1106   PT Time Calculation (min) 46 min   Activity Tolerance Patient tolerated treatment well   Behavior During Therapy [facility] for tasks assessed/performed      Past Medical History:  Diagnosis Date  . Cervical lymphadenopathy    right - followed my ENT  . Dystonia 1994   diagnosed in [place]  . Non Hodgkin's lymphoma ([place]) 2007   diffuse- 6 cycles of chemo with R CHOP Rituxan - last dose 10/08  . Parkinson disease ([place])    2015    Past Surgical History:  Procedure Laterality Date  . CHOLECYSTECTOMY, LAPAROSCOPIC    . EYE SURGERY     x2 as child  . PORT-A-CATH REMOVAL    . PORTACATH PLACEMENT    . SPLENECTOMY    . TONSILLECTOMY      There were no vitals filed for this visit.      Subjective Assessment - 07/06/17 1021    Subjective Was definitely sore from the exercises from therapy and in PWR! class, but I'm better now.   Patient Stated Goals To regain motivation for exercises   Currently in Pain? No/denies            [facility] PT Assessment - 07/06/17 1048      6 Minute Walk- Baseline   6 Minute Walk- Baseline yes   HR (bpm) 67   02 Sat (%RA) 99 %   Perceived Rate of Exertion (Borg) 6-     6 Minute walk- Post Test   6 Minute Walk Post Test yes   HR (bpm) 73   02 Sat (%RA) 99 %   Perceived Rate of Exertion (Borg) 15- Hard     6 minute walk test results    Aerobic Endurance Distance  Walked 1497                     [facility] Adult PT Treatment/Exercise - 07/06/17 0001      Ambulation/Gait   Ambulation/Gait Yes   Ambulation/Gait Assistance 5: Supervision   Ambulation/Gait Assistance Details Outdoor gait with 6 minute walk test-see results-pt tends to veer to R without gait outdoors, needing supervision, cues to come back to center of sidewalk.  Pays good attention to self-correcting posture and increased step length with gait.   Ambulation Distance (Feet) 1600 Feet  total (1497 ft 6MWT)   Assistive device None   Gait Pattern Step-through pattern;Decreased arm swing - right;Trunk flexed;Decreased trunk rotation   Ambulation Surface Unlevel;Outdoor   Gait Comments Following 6MWT outdoors, discussed and provided patient with handouts outlining progression for walking program at home (in neighborhood).  Recommended starting with 6 minutes and adding 1-2 minutes every several days.  (Pt's goal is to work up to 30 minutes)     Exercises   Exercises Knee/Hip;Lumbar   Other Exercises  supine cervical chin tuck with 1.5 inch pillow under his head; repeated 3  x 5 sec, then 1 x 30 seconds-cues provided for technique     Lumbar Exercises: Stretches   Single Knee to Chest Stretch 2 reps  15 seconds   Pelvic Tilt --  3 seconds x 8 reps, cues for technique   Pelvic Tilt Limitations Pt limited in anterior pelvic tilt; needs cues for slowed transition between posterior and anterior tilt positions     Knee/Hip Exercises: Stretches   Active Hamstring Stretch Both;3 reps   Active Hamstring Stretch Limitations Sitting position, supine position with belt, x 3 reps each; review of HEP-PT provides verbal cues for proper technique     Began with towel for supine hamstring stretch, but modified to belt (instructed for home to use belt or larger towel/sheet) for ease of holding onto for improved stretch position.           PT Education - 07/06/17 1133    Education provided  Yes   Education Details Reviewed stretches from HEP last visit, with cues provided for technique.  Added lumbar stretches to HEP; walking program for home   Person(s) Educated Patient   Methods Explanation;Demonstration;Verbal cues;Handout   Comprehension Verbalized understanding;Returned demonstration;Verbal cues required;Need further instruction          PT Short Term Goals - 06/17/17 2113      PT SHORT TERM GOAL #1   Title Pt will be independent with HEP for improved posture, balance, functional mobility.  TARGET 07/17/17   Time 5   Period Weeks   Status New   Target Date 07/17/17     PT SHORT TERM GOAL #2   Title Pt will verbalize understanding of fall prevention in home environment.   Time 5   Period Weeks   Status New   Target Date 07/17/17     PT SHORT TERM GOAL #3   Title Pt will improve push and release test to R side, to <2 steps independently regaining balance, for improved balance reactions.   Time 5   Period Weeks   Status New   Target Date 07/17/17           PT Long Term Goals - 07/06/17 1140      PT LONG TERM GOAL #1   Title Pt will verbalize plans for continued community fitness upon d/c from PT.  TARGET 07/31/17   Time 7   Period Weeks   Status New     PT LONG TERM GOAL #2   Title Pt will improve Functional Gait Assessment to at least 22/30 for decreased fall risk.   Time 7   Period Weeks   Status New     PT LONG TERM GOAL #3   Title 6 Minute walk test to be performed, with pt walking 75 ft more than baseline distance, with exertion rating <15 on modified Borg scale.   Baseline Baseline:  1497 ft (rated 15-hard-exertion level)   Time 7   Period Weeks   Status New               Plan - 07/06/17 1134    Clinical Impression Statement Review of stretches given as HEP last visit, with pt needing thorough review and max cues for correct technique of hamstring stretches (pt does report he did not do over the weekend).  Pt appears tighter in  R hamstrings versus left, and also appears to be limited by decreased lumbar flexibility.  Added stretches to address decreased lumbar flexibility to HEP.  6MWT performed, with pt ambulating 1497  ft with rated exertion as hard (15).  Pt's goal is to get back to walking 30 minutes in his neighborhood, so formally initiated walking program for patient.  Pt conitnues to be motivated for exercise and therapy activities and will continue to benefit from skilled PT to further address posture, balance, and gait.   Rehab Potential Good   PT Frequency 2x / week   PT Duration 6 weeks  plus 1x/wk (eval); total POC= 7 weeks   PT Treatment/Interventions ADLs/Self Care Home Management;Gait training;Functional mobility training;Therapeutic activities;Therapeutic exercise;Balance training;Neuromuscular re-education;Patient/family education   PT Next Visit Plan Review again stretches provided as part of HEP this visit and last; work on prone PWR! Moves exercises, standing and sitting if needed; provid info on [facility] cycling class at some point  Will likely need to discuss organizing exercises as part of comprehensive program, as PT and OT are adding exercises   Consulted and Agree with Plan of Care Patient      Patient will benefit from skilled therapeutic intervention in order to improve the following deficits and impairments:  Abnormal gait, Decreased balance, Decreased mobility, Difficulty walking, Postural dysfunction  Visit Diagnosis: Other abnormalities of gait and mobility  Abnormal posture  Other symptoms and signs involving the nervous system     Problem List Patient Active Problem List   Diagnosis Date Noted  . 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 Dysphagia, neurologic 07/27/2014  . Parkinson disease ([place]) 07/27/2014  . Dystonia 07/10/2014  . Low back pain 11/21/2011  . TRANSAMINASES, SERUM, ELEVATED 07/06/2008  .  DIARRHEA 05/23/2008  . LYMPH NODE-ENLARGED 07/28/2007  . Non-Hodgkin lymphoma ([place]) 03/23/2007    Baily Serpe W. 07/06/2017, 11:42 AM Frazier Butt., PT  [facility] [address] [place], [place], [address] Phone: [phone]   Fax:  [phone]  Name: [name] [name] MRN: [id number] Date of Birth: 1951/09/01

## 2017-07-06 NOTE — Patient Instructions (Addendum)
WALKING  Walking is a great form of exercise to increase your strength, endurance and overall fitness.  A walking program can help you start slowly and gradually build endurance as you go.  Everyone's ability is different, so each person's starting point will be different.  You do not have to follow them exactly.  The are just samples. You should simply find out what's right for you and stick to that program.   In the beginning, you'll start off walking 2-3 times a day for short distances.  As you get stronger, you'll be walking further at just 1-2 times per day.  A. You Can Walk For A Certain Length Of Time Each Day    Walk 6 minutes 1-2 times per day.  Increase 1-22 minutes every 3 days (1-2 times per day).  Work up to 25-30 minutes (1-2 times per day).   Example:   Day 1-2 6 minutes 1-2 times per day   Day 7-8 8-10 minutes 1-2 times per day   Day 13-14 18-20 minutes 1-2 times per day      Knee to Chest    [facility] your right leg, then bring your left knee to chest and hold for 15-30 seconds.  Repeat __3_ times. Repeat with other leg. Do __2_ times per day.  Copyright  VHI. All rights reserved.   Supine pelvic tilts (picture handout from basic back packet provided) -Anterior/posterior pelvic tilts 5-10 reps, with 3 second hold, 1-2 times per day.

## 2017-07-09 ENCOUNTER — Ambulatory Visit: Payer: Medicare Other | Admitting: Occupational Therapy

## 2017-07-09 ENCOUNTER — Ambulatory Visit: Payer: Medicare Other | Admitting: Physical Therapy

## 2017-07-09 DIAGNOSIS — R278 Other lack of coordination: Secondary | ICD-10-CM | POA: Diagnosis not present

## 2017-07-09 DIAGNOSIS — R2681 Unsteadiness on feet: Secondary | ICD-10-CM

## 2017-07-09 DIAGNOSIS — R293 Abnormal posture: Secondary | ICD-10-CM

## 2017-07-09 DIAGNOSIS — R29818 Other symptoms and signs involving the nervous system: Secondary | ICD-10-CM | POA: Diagnosis not present

## 2017-07-09 DIAGNOSIS — R2689 Other abnormalities of gait and mobility: Secondary | ICD-10-CM

## 2017-07-09 DIAGNOSIS — R41840 Attention and concentration deficit: Secondary | ICD-10-CM

## 2017-07-09 DIAGNOSIS — R251 Tremor, unspecified: Secondary | ICD-10-CM | POA: Diagnosis not present

## 2017-07-09 NOTE — Patient Instructions (Addendum)
(  Exercise) Monday Tuesday Wednesday Thursday Friday Saturday Sunday   PWR! Stand           PWR! Sitting           PWR! Prone-stomach           PWR! Supine-back                       PWR! Hands           Coordination           Stretches                      Walking                     Voice Exercises

## 2017-07-09 NOTE — Therapy (Signed)
[facility] [address] [place], [place], [address] Phone: [phone]   Fax:  [phone]  Physical Therapy Treatment  Patient Details  Name: [name] [name] MRN: [id number] Date of Birth: [birth year] Referring Provider: Alonza Bogus, DO  Encounter Date: 07/09/2017      PT End of Session - 07/09/17 2148    Visit Number 5   Number of Visits 13   Date for PT Re-Evaluation 08/16/17   Authorization Type Medicare Traditional primary, BCBS secondary-GCODE every 10th visit   PT Start Time [phone]  Late start from finishin OT session   PT Stop Time 0930   PT Time Calculation (min) 34 min   Activity Tolerance Patient tolerated treatment well   Behavior During Therapy [facility] for tasks assessed/performed      Past Medical History:  Diagnosis Date  . Cervical lymphadenopathy    right - followed my ENT  . Dystonia 1994   diagnosed in [place]  . Non Hodgkin's lymphoma ([place]) 2007   diffuse- 6 cycles of chemo with R CHOP Rituxan - last dose 10/08  . Parkinson disease ([place])    2015    Past Surgical History:  Procedure Laterality Date  . CHOLECYSTECTOMY, LAPAROSCOPIC    . EYE SURGERY     x2 as child  . PORT-A-CATH REMOVAL    . PORTACATH PLACEMENT    . SPLENECTOMY    . TONSILLECTOMY      There were no vitals filed for this visit.      Subjective Assessment - 07/09/17 0858    Subjective Enjoying the class, a little less sore   Patient Stated Goals To regain motivation for exercises   Currently in Pain? No/denies                         [facility] Adult PT Treatment/Exercise - 07/09/17 0001      Self-Care   Self-Care Other Self-Care Comments   Other Self-Care Comments  Provided and discussed exercise chart to assist in organizing HEP and being able to consistently perform.  Discussed multiple PWR! Moves routines in conjunction with walking and stretching, and benefit to performing PWR! MOves and full HEP  on a consistent basis.     Exercises   Other Exercises  Reviewed knee to chest and pelvic tilts provided from last visit-pt able to return demo understnading           PWR [facility]) - 07/09/17 2145    PWR! exercises Moves in sitting   Basic 4 Flow x 5 reps with visual and verbal cues for sequence   Comments Discussed importance of addition of dual task/cognitive tasks with PWR! Moves for progression of exercises             PT Education - 07/09/17 2147    Education provided Yes   Education Details Exercise chart for HEP   Person(s) Educated Patient   Methods Explanation   Comprehension Verbalized understanding          PT Short Term Goals - 06/17/17 2113      PT SHORT TERM GOAL #1   Title Pt will be independent with HEP for improved posture, balance, functional mobility.  TARGET 07/17/17   Time 5   Period Weeks   Status New   Target Date 07/17/17     PT SHORT TERM GOAL #2   Title Pt will verbalize understanding of fall prevention in home environment.   Time 5  Period Weeks   Status New   Target Date 07/17/17     PT SHORT TERM GOAL #3   Title Pt will improve push and release test to R side, to <2 steps independently regaining balance, for improved balance reactions.   Time 5   Period Weeks   Status New   Target Date 07/17/17           PT Long Term Goals - 07/06/17 1140      PT LONG TERM GOAL #1   Title Pt will verbalize plans for continued community fitness upon d/c from PT.  TARGET 07/31/17   Time 7   Period Weeks   Status New     PT LONG TERM GOAL #2   Title Pt will improve Functional Gait Assessment to at least 22/30 for decreased fall risk.   Time 7   Period Weeks   Status New     PT LONG TERM GOAL #3   Title 6 Minute walk test to be performed, with pt walking 75 ft more than baseline distance, with exertion rating <15 on modified Borg scale.   Baseline Baseline:  1497 ft (rated 15-hard-exertion level)   Time 7   Period Weeks   Status  New               Plan - 07/09/17 2149    Clinical Impression Statement Focused on providing/discussing HEP chart to help patient organize and prioritize exercises daily, as pt feels he has a lot of exercises from PT and OT at this time.  He hasn't had time to really focus on walking program this week.  Pt will continue to benefit from skilled PT to further address balance, posture and gait.   Rehab Potential Good   PT Frequency 2x / week   PT Duration 6 weeks  plus 1x/wk (eval); total POC= 7 weeks   PT Treatment/Interventions ADLs/Self Care Home Management;Gait training;Functional mobility training;Therapeutic activities;Therapeutic exercise;Balance training;Neuromuscular re-education;Patient/family education   PT Next Visit Plan stepping strategies varied directions, compliant surfaces; review HEP chart and progression of walking program  Will likely need to discuss organizing exercises as part of comprehensive program, as PT and OT are adding exercises   Consulted and Agree with Plan of Care Patient      Patient will benefit from skilled therapeutic intervention in order to improve the following deficits and impairments:  Abnormal gait, Decreased balance, Decreased mobility, Difficulty walking, Postural dysfunction  Visit Diagnosis: Abnormal posture  Unsteadiness on feet     Problem List Patient Active Problem List   Diagnosis Date Noted  . 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 Dysphagia, neurologic 07/27/2014  . Parkinson disease ([place]) 07/27/2014  . Dystonia 07/10/2014  . Low back pain 11/21/2011  . TRANSAMINASES, SERUM, ELEVATED 07/06/2008  . DIARRHEA 05/23/2008  . LYMPH NODE-ENLARGED 07/28/2007  . Non-Hodgkin lymphoma ([place]) 03/23/2007    MARRIOTT,AMY W. 07/09/2017, 9:55 PM  Frazier Butt., PT   [facility] [address] [place], [place], [address] Phone: [phone]   Fax:  [phone]  Name: TRIPP GOINS MRN: [id number] Date of Birth: [birth year]

## 2017-07-09 NOTE — Therapy (Signed)
[facility] [address] [place], [place], [address] Phone: [phone]   Fax:  [phone]  Occupational Therapy Treatment  Patient Details  Name: [name] [name] MRN: [id number] Date of Birth: June 23, 1951 Referring Provider: Dr. Wells Guiles Tat  Encounter Date: 07/09/2017      OT End of Session - 07/09/17 0812    Visit Number 5   Number of Visits 17   Date for OT Re-Evaluation 08/16/17   Authorization Type Medicare / BCBS;  needs G-code   Authorization - Visit Number 5   Authorization - Number of Visits 10   OT Start Time 0809   OT Stop Time 3825   OT Time Calculation (min) 38 min   Activity Tolerance Patient tolerated treatment well   Behavior During Therapy [facility] for tasks assessed/performed      Past Medical History:  Diagnosis Date  . Cervical lymphadenopathy    right - followed my ENT  . Dystonia 1994   diagnosed in [place]  . Non Hodgkin's lymphoma ([place]) 2007   diffuse- 6 cycles of chemo with R CHOP Rituxan - last dose 10/08  . Parkinson disease ([place])    2015    Past Surgical History:  Procedure Laterality Date  . CHOLECYSTECTOMY, LAPAROSCOPIC    . EYE SURGERY     x2 as child  . PORT-A-CATH REMOVAL    . PORTACATH PLACEMENT    . SPLENECTOMY    . TONSILLECTOMY      There were no vitals filed for this visit.      Subjective Assessment - 07/09/17 0811    Subjective  Enjoying class.  PWR! flow was difficult   Pertinent History Parkinson's disease, non-hodgkin lymphoma, dystonia, Meige syndrome   Patient Stated Goals to re-establish exercise routine and improve ADLs   Currently in Pain? No/denies      PWR! Moves (basic 4) in prone x 20 each with min cues For incr movement amplitude.  Placing grooved pegs in pegboard with each hand for in-hand manipulation with min cues for PWR! Hands.  In standing functional step and reach forward/back with wt. Shift and trunk rotation to place large pegs in  vertical pegboard for overhead reaching with min cueing for incr movement amplitude each side.                          OT Education - 07/09/17 1309    Education Details Prone PWR! moves (basic 4); Verbally instructed in ways to incorporate cognitive components/dual task into HEP   Person(s) Educated Patient   Methods Explanation;Demonstration;Handout;Verbal cues   Comprehension Verbalized understanding;Returned demonstration  min cueing for technique, incr amplitude          OT Short Term Goals - 06/17/17 1424      OT SHORT TERM GOAL #1   Title Pt will be independent with updated PD specific HEP--check STGs 07/16/17   Time 4   Period Weeks   Status New     OT SHORT TERM GOAL #2   Title Pt will be able to write at least 2 sentences with at least 75% legibility.   Baseline 25%   Time 4   Period Weeks   Status New     OT SHORT TERM GOAL #3   Title Pt will demonstrate increased LUE functional use as evidenced by increasing box/ blocks score by 4 blocks.   Baseline R-50, L-45 blocks   Time 4   Period Weeks  Status New     OT SHORT TERM GOAL #4   Title Pt will verbalize understanding of ways to prevent future complications and appropriate community resources.     OT SHORT TERM GOAL #5   Title ---------------           OT Long Term Goals - 06/17/17 1427      OT LONG TERM GOAL #1   Title Pt will verbalize understanding of large amplitude movement strategies to incr ease/efficiency with ADLs/IADLs.--check LTGs 08/16/17   Baseline --   Time 8   Period Weeks   Status New     OT LONG TERM GOAL #2   Title Pt will improve dominant R hand coordination for ADLs as shown by improving time on 9-hole peg test by at least 3sec.   Baseline R-28.81sec, L-24.75sec   Time 8   Period Weeks   Status New     OT LONG TERM GOAL #3   Title Pt will be able to write at least 3 sentences with at least 90% legibility.   Baseline 25%   Time 8   Period Weeks    Status New     OT LONG TERM GOAL #4   Title Pt will improve ability to fasten/unfasten 3 futtons in 22sec or less using BUEs.   Baseline 25.06sec with minimal use of R hand   Time 4   Period Weeks   Status New               Plan - 07/09/17 7619    Clinical Impression Statement Pt is progressing well towards goals.  Pt with improved timing, but improved overall with less cueing needed.   Rehab Potential Good   OT Frequency 2x / week   OT Duration 8 weeks  +eval   OT Treatment/Interventions Self-care/ADL training;Moist Heat;Fluidtherapy;DME and/or AE instruction;Patient/family education;Balance training;Therapeutic exercises;Ultrasound;Therapeutic exercise;Therapeutic activities;Cognitive remediation/compensation;Passive range of motion;Neuromuscular education;Cryotherapy;Parrafin;Energy conservation;Manual Therapy;Functional Mobility Training   Plan add dual task with HEP, large amplitude movements for ADLs   OT Home Exercise Plan Education provided:  PWR! moves in quadraped (basic 4), coordination HEP, PT issued PWR! in sitting/standing   Consulted and Agree with Plan of Care Patient      Patient will benefit from skilled therapeutic intervention in order to improve the following deficits and impairments:  Abnormal gait, Decreased coordination, Impaired flexibility, Impaired UE functional use, Decreased knowledge of use of DME, Decreased balance, Decreased mobility, Decreased strength, Improper spinal/pelvic alignment, Decreased cognition (bradykinesia)  Visit Diagnosis: Other symptoms and signs involving the nervous system  Other abnormalities of gait and mobility  Abnormal posture  Unsteadiness on feet  Other lack of coordination  Tremor, unspecified  Attention and concentration deficit    Problem List Patient Active Problem List   Diagnosis Date Noted  . 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  Dysphagia, neurologic 07/27/2014  . Parkinson disease ([place]) 07/27/2014  . Dystonia 07/10/2014  . Low back pain 11/21/2011  . TRANSAMINASES, SERUM, ELEVATED 07/06/2008  . DIARRHEA 05/23/2008  . LYMPH NODE-ENLARGED 07/28/2007  . Non-Hodgkin lymphoma ([place]) 03/23/2007    [facility] 07/09/2017, 1:12 PM  [facility] [address] [place], [place], [address] Phone: [phone]   Fax:  [phone]  Name: [name] [name] MRN: [id number] Date of Birth: 15-Jul-1951   Vianne Bulls, OTR/L [facility] [address] [place], [place]  [address] [phone] phone [phone] 07/09/17 1:12 PM  .

## 2017-07-13 ENCOUNTER — Ambulatory Visit: Payer: Medicare Other | Admitting: Occupational Therapy

## 2017-07-13 ENCOUNTER — Ambulatory Visit: Payer: Medicare Other | Admitting: Physical Therapy

## 2017-07-13 ENCOUNTER — Encounter: Payer: Self-pay | Admitting: Physical Therapy

## 2017-07-13 DIAGNOSIS — R278 Other lack of coordination: Secondary | ICD-10-CM

## 2017-07-13 DIAGNOSIS — R293 Abnormal posture: Secondary | ICD-10-CM

## 2017-07-13 DIAGNOSIS — R2689 Other abnormalities of gait and mobility: Secondary | ICD-10-CM | POA: Diagnosis not present

## 2017-07-13 DIAGNOSIS — R251 Tremor, unspecified: Secondary | ICD-10-CM | POA: Diagnosis not present

## 2017-07-13 DIAGNOSIS — R41840 Attention and concentration deficit: Secondary | ICD-10-CM

## 2017-07-13 DIAGNOSIS — R29818 Other symptoms and signs involving the nervous system: Secondary | ICD-10-CM

## 2017-07-13 DIAGNOSIS — R2681 Unsteadiness on feet: Secondary | ICD-10-CM | POA: Diagnosis not present

## 2017-07-13 NOTE — Patient Instructions (Addendum)
Hamstring Stretch    With other leg bent, foot flat, grasp right leg (can use a pillow case or towel behind your knee to hold on to) and slowly try to straighten knee. Hold __30__ seconds. Repeat _3___ times on each leg. Do _1___ sessions per day.  [email or web address].[email or web address]   HIP: Hamstrings - Short Sitting    Rest leg on raised surface or on the floor. Keep knee straight. Lift chest and lean forward until you feel a stretch down the back of your leg. Hold __30_ seconds. __3_ reps per set, _1__ sets per day, __5_ days per week  Copyright  VHI. All rights reserved.    Suboccipital Stretch (Supine)    With small towel roll at base of skull and upper neck (or can do without the roll). Gently tuck chin and lengthen the back of your neck until stretch is felt at base of skull and upper neck. Hold __30__ seconds. Relax. Repeat __3__ times per set. Do __1__ sets per session.   Knee to Chest    [facility] your right leg, then bring your left knee to chest and hold for 15-30 seconds.  Repeat __3_ times. Repeat with other leg. Do __2_ times per day.  Copyright  VHI. All rights reserved.    Supine pelvic tilts (picture handout from basic back packet provided) -Anterior/posterior pelvic tilts 5-10 reps, with 3 second hold, 1-2 times per day. Fall Prevention in the Home Falls can cause injuries and can affect people from all age groups. There are many simple things that you can do to make your home safe and to help prevent falls. What can I do on the outside of my home?  Regularly repair the edges of walkways and driveways and fix any cracks.  Remove high doorway thresholds.  Trim any shrubbery on the main path into your home.  Use bright outdoor lighting.  Clear walkways of debris and clutter, including tools and rocks.  Regularly check that handrails are securely fastened and in good repair. Both sides of any steps should have handrails.  Install guardrails along the edges  of any raised decks or porches.  Have leaves, snow, and ice cleared regularly.  Use sand or salt on walkways during winter months.  In the garage, clean up any spills right away, including grease or oil spills. What can I do in the bathroom?  Use night lights.  Install grab bars by the toilet and in the tub and shower. Do not use towel bars as grab bars.  Use non-skid mats or decals on the floor of the tub or shower.  If you need to sit down while you are in the shower, use a plastic, non-slip stool.  Keep the floor dry. Immediately clean up any water that spills on the floor.  Remove soap buildup in the tub or shower on a regular basis.  Attach bath mats securely with double-sided non-slip rug tape.  Remove throw rugs and other tripping hazards from the floor. What can I do in the bedroom?  Use night lights.  Make sure that a bedside light is easy to reach.  Do not use oversized bedding that drapes onto the floor.  Have a firm chair that has side arms to use for getting dressed.  Remove throw rugs and other tripping hazards from the floor. What can I do in the kitchen?  Clean up any spills right away.  Avoid walking on wet floors.  Place frequently used items in easy-to-reach places.  If  you need to reach for something above you, use a sturdy step stool that has a grab bar.  Keep electrical cables out of the way.  Do not use floor polish or wax that makes floors slippery. If you have to use wax, make sure that it is non-skid floor wax.  Remove throw rugs and other tripping hazards from the floor. What can I do in the stairways?  Do not leave any items on the stairs.  Make sure that there are handrails on both sides of the stairs. Fix handrails that are broken or loose. Make sure that handrails are as long as the stairways.  Check any carpeting to make sure that it is firmly attached to the stairs. Fix any carpet that is loose or worn.  Avoid having throw  rugs at the top or bottom of stairways, or secure the rugs with carpet tape to prevent them from moving.  Make sure that you have a light switch at the top of the stairs and the bottom of the stairs. If you do not have them, have them installed. What are some other fall prevention tips?  Wear closed-toe shoes that fit well and support your feet. Wear shoes that have rubber soles or low heels.  When you use a stepladder, make sure that it is completely opened and that the sides are firmly locked. Have someone hold the ladder while you are using it. Do not climb a closed stepladder.  Add color or contrast paint or tape to grab bars and handrails in your home. Place contrasting color strips on the first and last steps.  Use mobility aids as needed, such as canes, walkers, scooters, and crutches.  Turn on lights if it is dark. Replace any light bulbs that burn out.  Set up furniture so that there are clear paths. Keep the furniture in the same spot.  Fix any uneven floor surfaces.  Choose a carpet design that does not hide the edge of steps of a stairway.  Be aware of any and all pets.  Review your medicines with your healthcare provider. Some medicines can cause dizziness or changes in blood pressure, which increase your risk of falling. Talk with your health care provider about other ways that you can decrease your risk of falls. This may include working with a physical therapist or trainer to improve your strength, balance, and endurance. This information is not intended to replace advice given to you by your health care provider. Make sure you discuss any questions you have with your health care provider. Document Released: 08/29/2002 Document Revised: 02/05/2016 Document Reviewed: 10/13/2014 Elsevier Interactive Patient Education  2017 [employer].

## 2017-07-13 NOTE — Therapy (Addendum)
[facility] [address] [place], [place], [address] Phone: [phone]   Fax:  [phone]  Physical Therapy Treatment  Patient Details  Name: [name] [name] MRN: [id number] Date of Birth: 03-17-1951 Referring Provider: Alonza Bogus, DO  Encounter Date: 07/13/2017      PT End of Session - 07/13/17 0842    Visit Number 6   Number of Visits 13   Date for PT Re-Evaluation 08/16/17   Authorization Type Medicare Traditional primary, BCBS secondary-GCODE every 10th visit   PT Start Time 0845   PT Stop Time 0928   PT Time Calculation (min) 43 min   Activity Tolerance Patient tolerated treatment well   Behavior During Therapy [facility] for tasks assessed/performed      Past Medical History:  Diagnosis Date  . Cervical lymphadenopathy    right - followed my ENT  . Dystonia 1994   diagnosed in [place]  . Non Hodgkin's lymphoma ([place]) 2007   diffuse- 6 cycles of chemo with R CHOP Rituxan - last dose 10/08  . Parkinson disease ([place])    2015    Past Surgical History:  Procedure Laterality Date  . CHOLECYSTECTOMY, LAPAROSCOPIC    . EYE SURGERY     x2 as child  . PORT-A-CATH REMOVAL    . PORTACATH PLACEMENT    . SPLENECTOMY    . TONSILLECTOMY      There were no vitals filed for this visit.      Subjective Assessment - 07/13/17 0836    Subjective Using exercise grid given last time. Has made copies of sheet to mark what he has done. unsure what stretches he is supposed to do every day   Patient Stated Goals To regain motivation for exercises   Currently in Pain? No/denies                               [facility] Adult PT Treatment/Exercise - 07/13/17 1821      Posture/Postural Control   Posture/Postural Control Postural limitations   Postural Limitations Forward head;Rounded Shoulders;Posterior pelvic tilt   Posture Comments sitting chin tuck; upright posture as preparing to do seated hamstring  stretch     High Level Balance   High Level Balance Comments on compliant surface (red mat); lean and release forward, backwrds and bil sideways x 5 reps each; pt able to correct/catch his balance with one step forward and backward; corss-over step plus second step to each side     Lumbar Exercises: Stretches   Active Hamstring Stretch Limitations x           PWR [facility]) - 07/13/17 1824    Comments red mat, anterior and postrerior large steps with arms extending and return to center x 5 each leg each dirctions      See pt instructions for all stretches re-educated on. (pt requested as he did not know what exercises to do as "stretches" on his exercise grid and could not find his exercise hand-outs/pictures at home). Reprinted and completed 1-2 reps of each for instruction.          PT Education - 07/13/17 1825    Education provided Yes   Education Details fall precaution education; how to use exercise grid; how to do stretches given by PT   Person(s) Educated Patient   Methods Explanation;Demonstration;Tactile cues;Verbal cues;Handout   Comprehension Returned demonstration;Verbal cues required;Tactile cues required;Need further instruction  PT Short Term Goals - 06/17/17 2113      PT SHORT TERM GOAL #1   Title Pt will be independent with HEP for improved posture, balance, functional mobility.  TARGET 07/17/17   Time 5   Period Weeks   Status New   Target Date 07/17/17     PT SHORT TERM GOAL #2   Title Pt will verbalize understanding of fall prevention in home environment.   Time 5   Period Weeks   Status New   Target Date 07/17/17     PT SHORT TERM GOAL #3   Title Pt will improve push and release test to R side, to <2 steps independently regaining balance, for improved balance reactions.   Time 5   Period Weeks   Status New   Target Date 07/17/17           PT Long Term Goals - 07/06/17 1140      PT LONG TERM GOAL #1   Title Pt will verbalize  plans for continued community fitness upon d/c from PT.  TARGET 07/31/17   Time 7   Period Weeks   Status New     PT LONG TERM GOAL #2   Title Pt will improve Functional Gait Assessment to at least 22/30 for decreased fall risk.   Time 7   Period Weeks   Status New     PT LONG TERM GOAL #3   Title 6 Minute walk test to be performed, with pt walking 75 ft more than baseline distance, with exertion rating <15 on modified Borg scale.   Baseline Baseline:  1497 ft (rated 15-hard-exertion level)   Time 7   Period Weeks   Status New               Plan - 07/13/17 1827    Clinical Impression Statement Skilled session focused on education re: use of exercise grid and the stretches assigned by PT previously. Patient able to correctly perform to the point of a mild stretch and identify where he should feel each stretch with min cues. Patient can continue to benefit from PT to work towards his goals.    Rehab Potential Good   PT Frequency 2x / week   PT Duration 6 weeks  plus 1x/wk (eval); total POC= 7 weeks   PT Treatment/Interventions ADLs/Self Care Home Management;Gait training;Functional mobility training;Therapeutic activities;Therapeutic exercise;Balance training;Neuromuscular re-education;Patient/family education   PT Next Visit Plan check STGs; f/u on progress with walking program; compliant surfaces;   Will likely need to discuss organizing exercises as part of comprehensive program, as PT and OT are adding exercises   Consulted and Agree with Plan of Care Patient      Patient will benefit from skilled therapeutic intervention in order to improve the following deficits and impairments:  Abnormal gait, Decreased balance, Decreased mobility, Difficulty walking, Postural dysfunction  Visit Diagnosis: Unsteadiness on feet  Other symptoms and signs involving the nervous system     Problem List Patient Active Problem List   Diagnosis Date Noted  . Post-splenectomy  04/02/2015  . 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 Dysphagia, neurologic 07/27/2014  . Parkinson disease ([place]) 07/27/2014  . Dystonia 07/10/2014  . Low back pain 11/21/2011  . TRANSAMINASES, SERUM, ELEVATED 07/06/2008  . DIARRHEA 05/23/2008  . LYMPH NODE-ENLARGED 07/28/2007  . Non-Hodgkin lymphoma ([place]) 03/23/2007    Rexanne Mano, PT 07/13/2017, 7:21 PM  [facility] [address]  [place], [place], [address] Phone: [phone]   Fax:  [phone]  Name: DEUNDRA BARD MRN: [id number] Date of Birth: 06/30/1951

## 2017-07-13 NOTE — Therapy (Signed)
[facility] [address] [place], [place], [address] Phone: [phone]   Fax:  [phone]  Occupational Therapy Treatment  Patient Details  Name: [name] [name] MRN: [id number] Date of Birth: 1951/06/30 Referring Provider: Dr. Wells Guiles Tat  Encounter Date: 07/13/2017      OT End of Session - 07/13/17 0806    Visit Number 6   Number of Visits 17   Date for OT Re-Evaluation 08/16/17   Authorization Type Medicare / BCBS;  needs G-code   Authorization - Visit Number 6   Authorization - Number of Visits 10   OT Start Time 0803   OT Stop Time 0845   OT Time Calculation (min) 42 min   Activity Tolerance Patient tolerated treatment well   Behavior During Therapy [facility] for tasks assessed/performed      Past Medical History:  Diagnosis Date  . Cervical lymphadenopathy    right - followed my ENT  . Dystonia 1994   diagnosed in [place]  . Non Hodgkin's lymphoma ([place]) 2007   diffuse- 6 cycles of chemo with R CHOP Rituxan - last dose 10/08  . Parkinson disease ([place])    2015    Past Surgical History:  Procedure Laterality Date  . CHOLECYSTECTOMY, LAPAROSCOPIC    . EYE SURGERY     x2 as child  . PORT-A-CATH REMOVAL    . PORTACATH PLACEMENT    . SPLENECTOMY    . TONSILLECTOMY      There were no vitals filed for this visit.      Subjective Assessment - 07/13/17 0804    Pertinent History Parkinson's disease, non-hodgkin lymphoma, dystonia, Meige syndrome   Patient Stated Goals to re-establish exercise routine and improve ADLs   Currently in Pain? No/denies       Placing small pegs in pegboard to copy design with use of PWR! Hands with each hand with min incr time.  Removing pegs using in-hand manipulation.  PWR! Multi-directional Movements:   Stepping to given sequence with min difficulty/cues for dual task/cognitive component initially only. Followed by multi-directional reaching with stepping with min  difficulty/cues for incr movement amplitude initially.   Then repeated reaching and stepping while calling out given colors with min difficulties/cues for dual task/additional cognitive component.  Continued instruction on how to progress HEP/incr difficulty and add cognitive components to exercise.  Pt verbalized understanding.  Functional step and reach to flip large cards using diagonal step back over small obstacle with each LE for wt. Shift/trunk rotation and min cueing for incr movement amplitude with step, reach (elbow ext, supination, finger ext).                          OT Education - 07/13/17 1212    Education Details Appropriate community exercise opportunities/resources including [employer] (upcoming events from [employer])   Person(s) Educated Patient   Methods Explanation;Handout   Comprehension Verbalized understanding          OT Short Term Goals - 07/13/17 1218      OT SHORT TERM GOAL #1   Title Pt will be independent with updated PD specific HEP--check STGs 07/16/17   Time 4   Period Weeks   Status New     OT SHORT TERM GOAL #2   Title Pt will be able to write at least 2 sentences with at least 75% legibility.   Baseline 25%   Time 4   Period  Weeks   Status New     OT SHORT TERM GOAL #3   Title Pt will demonstrate increased LUE functional use as evidenced by increasing box/ blocks score by 4 blocks.   Baseline R-50, L-45 blocks   Time 4   Period Weeks   Status New     OT SHORT TERM GOAL #4   Title Pt will verbalize understanding of ways to prevent future complications and appropriate community resources.   Time 4   Period Weeks   Status On-going     OT SHORT TERM GOAL #5   Title ---------------           OT Long Term Goals - 06/17/17 1427      OT LONG TERM GOAL #1   Title Pt will verbalize understanding of large amplitude movement strategies to incr ease/efficiency with ADLs/IADLs.--check LTGs 08/16/17    Baseline --   Time 8   Period Weeks   Status New     OT LONG TERM GOAL #2   Title Pt will improve dominant R hand coordination for ADLs as shown by improving time on 9-hole peg test by at least 3sec.   Baseline R-28.81sec, L-24.75sec   Time 8   Period Weeks   Status New     OT LONG TERM GOAL #3   Title Pt will be able to write at least 3 sentences with at least 90% legibility.   Baseline 25%   Time 8   Period Weeks   Status New     OT LONG TERM GOAL #4   Title Pt will improve ability to fasten/unfasten 3 futtons in 22sec or less using BUEs.   Baseline 25.06sec with minimal use of R hand   Time 4   Period Weeks   Status New               Plan - 07/13/17 0807    Clinical Impression Statement Pt is progressing towards goals with improving coordination and reports use of strategies with ADLs at home.     Rehab Potential Good   OT Frequency 2x / week   OT Duration 8 weeks  +eval   OT Treatment/Interventions Self-care/ADL training;Moist Heat;Fluidtherapy;DME and/or AE instruction;Patient/family education;Balance training;Therapeutic exercises;Ultrasound;Therapeutic exercise;Therapeutic activities;Cognitive remediation/compensation;Passive range of motion;Neuromuscular education;Cryotherapy;Parrafin;Energy conservation;Manual Therapy;Functional Mobility Training   Plan coordination, large amplitude movements for functional tasks, begin checking STGs   OT Home Exercise Plan Education provided:  PWR! moves in quadraped (basic 4), coordination HEP, PT issued PWR! in sitting/standing   Consulted and Agree with Plan of Care Patient      Patient will benefit from skilled therapeutic intervention in order to improve the following deficits and impairments:  Abnormal gait, Decreased coordination, Impaired flexibility, Impaired UE functional use, Decreased knowledge of use of DME, Decreased balance, Decreased mobility, Decreased strength, Improper spinal/pelvic alignment, Decreased  cognition (bradykinesia)  Visit Diagnosis: Other symptoms and signs involving the nervous system  Other lack of coordination  Tremor, unspecified  Unsteadiness on feet  Abnormal posture  Other abnormalities of gait and mobility  Attention and concentration deficit    Problem List Patient Active Problem List   Diagnosis Date Noted  . 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 Dysphagia, neurologic 07/27/2014  . Parkinson disease ([place]) 07/27/2014  . Dystonia 07/10/2014  . Low back pain 11/21/2011  . TRANSAMINASES, SERUM, ELEVATED 07/06/2008  . DIARRHEA 05/23/2008  . LYMPH NODE-ENLARGED 07/28/2007  . Non-Hodgkin lymphoma ([place]) 03/23/2007  [facility] 07/13/2017, 12:19 PM  [facility] [address] [place], [place], [address] Phone: [phone]   Fax:  [phone]  Name: [name] [name] MRN: [id number] Date of Birth: 02-14-1951   Vianne Bulls, OTR/L [facility] [address] [place], [place]  [address] [phone] phone [phone] 07/13/17 12:19 PM

## 2017-07-16 ENCOUNTER — Ambulatory Visit: Payer: Medicare Other | Admitting: Occupational Therapy

## 2017-07-16 ENCOUNTER — Encounter: Payer: Self-pay | Admitting: Physical Therapy

## 2017-07-16 ENCOUNTER — Ambulatory Visit: Payer: Medicare Other | Admitting: Physical Therapy

## 2017-07-16 DIAGNOSIS — R278 Other lack of coordination: Secondary | ICD-10-CM | POA: Diagnosis not present

## 2017-07-16 DIAGNOSIS — R2681 Unsteadiness on feet: Secondary | ICD-10-CM

## 2017-07-16 DIAGNOSIS — R2689 Other abnormalities of gait and mobility: Secondary | ICD-10-CM

## 2017-07-16 DIAGNOSIS — R293 Abnormal posture: Secondary | ICD-10-CM | POA: Diagnosis not present

## 2017-07-16 DIAGNOSIS — R29818 Other symptoms and signs involving the nervous system: Secondary | ICD-10-CM

## 2017-07-16 DIAGNOSIS — R269 Unspecified abnormalities of gait and mobility: Secondary | ICD-10-CM

## 2017-07-16 DIAGNOSIS — R251 Tremor, unspecified: Secondary | ICD-10-CM | POA: Diagnosis not present

## 2017-07-16 DIAGNOSIS — R41840 Attention and concentration deficit: Secondary | ICD-10-CM

## 2017-07-16 NOTE — Patient Instructions (Addendum)
Fall Prevention in the Home Falls can cause injuries and can affect people from all age groups. There are many simple things that you can do to make your home safe and to help prevent falls. What can I do on the outside of my home?  Regularly repair the edges of walkways and driveways and fix any cracks.  Remove high doorway thresholds.  Trim any shrubbery on the main path into your home.  Use bright outdoor lighting.  Clear walkways of debris and clutter, including tools and rocks.  Regularly check that handrails are securely fastened and in good repair. Both sides of any steps should have handrails.  Install guardrails along the edges of any raised decks or porches.  Have leaves, snow, and ice cleared regularly.  Use sand or salt on walkways during winter months.  In the garage, clean up any spills right away, including grease or oil spills. What can I do in the bathroom?  Use night lights.  Install grab bars by the toilet and in the tub and shower. Do not use towel bars as grab bars.  Use non-skid mats or decals on the floor of the tub or shower.  If you need to sit down while you are in the shower, use a plastic, non-slip stool.  Keep the floor dry. Immediately clean up any water that spills on the floor.  Remove soap buildup in the tub or shower on a regular basis.  Attach bath mats securely with double-sided non-slip rug tape.  Remove throw rugs and other tripping hazards from the floor. What can I do in the bedroom?  Use night lights.  Make sure that a bedside light is easy to reach.  Do not use oversized bedding that drapes onto the floor.  Have a firm chair that has side arms to use for getting dressed.  Remove throw rugs and other tripping hazards from the floor. What can I do in the kitchen?  Clean up any spills right away.  Avoid walking on wet floors.  Place frequently used items in easy-to-reach places.  If you need to reach for something above  you, use a sturdy step stool that has a grab bar.  Keep electrical cables out of the way.  Do not use floor polish or wax that makes floors slippery. If you have to use wax, make sure that it is non-skid floor wax.  Remove throw rugs and other tripping hazards from the floor. What can I do in the stairways?  Do not leave any items on the stairs.  Make sure that there are handrails on both sides of the stairs. Fix handrails that are broken or loose. Make sure that handrails are as long as the stairways.  Check any carpeting to make sure that it is firmly attached to the stairs. Fix any carpet that is loose or worn.  Avoid having throw rugs at the top or bottom of stairways, or secure the rugs with carpet tape to prevent them from moving.  Make sure that you have a light switch at the top of the stairs and the bottom of the stairs. If you do not have them, have them installed. What are some other fall prevention tips?  Wear closed-toe shoes that fit well and support your feet. Wear shoes that have rubber soles or low heels.  When you use a stepladder, make sure that it is completely opened and that the sides are firmly locked. Have someone hold the ladder while you are using   it. Do not climb a closed stepladder.  Add color or contrast paint or tape to grab bars and handrails in your home. Place contrasting color strips on the first and last steps.  Use mobility aids as needed, such as canes, walkers, scooters, and crutches.  Turn on lights if it is dark. Replace any light bulbs that burn out.  Set up furniture so that there are clear paths. Keep the furniture in the same spot.  Fix any uneven floor surfaces.  Choose a carpet design that does not hide the edge of steps of a stairway.  Be aware of any and all pets.  Review your medicines with your healthcare provider. Some medicines can cause dizziness or changes in blood pressure, which increase your risk of falling. Talk with  your health care provider about other ways that you can decrease your risk of falls. This may include working with a physical therapist or trainer to improve your strength, balance, and endurance. This information is not intended to replace advice given to you by your health care provider. Make sure you discuss any questions you have with your health care provider. Document Released: 08/29/2002 Document Revised: 02/05/2016 Document Reviewed: 10/13/2014 Elsevier Interactive Patient Education  2017 Elsevier Inc.  

## 2017-07-16 NOTE — Therapy (Signed)
[facility] [address] [place], [place], [address] Phone: [phone]   Fax:  [phone]  Physical Therapy Treatment  Patient Details  Name: [name] [name] MRN: [id number] Date of Birth: 1951-04-07 Referring Provider: Alonza Bogus, DO  Encounter Date: 07/16/2017      PT End of Session - 07/16/17 1827    Visit Number 7   Number of Visits 13   Date for PT Re-Evaluation 08/16/17   Authorization Type Medicare Traditional primary, BCBS secondary-GCODE every 10th visit   PT Start Time 0850   PT Stop Time 0929   PT Time Calculation (min) 39 min   Equipment Utilized During Treatment Gait belt   Activity Tolerance Patient tolerated treatment well   Behavior During Therapy [facility] for tasks assessed/performed      Past Medical History:  Diagnosis Date  . Cervical lymphadenopathy    right - followed my ENT  . Dystonia 1994   diagnosed in [place]  . Non Hodgkin's lymphoma ([place]) 2007   diffuse- 6 cycles of chemo with R CHOP Rituxan - last dose 10/08  . Parkinson disease ([place])    2015    Past Surgical History:  Procedure Laterality Date  . CHOLECYSTECTOMY, LAPAROSCOPIC    . EYE SURGERY     x2 as child  . PORT-A-CATH REMOVAL    . PORTACATH PLACEMENT    . SPLENECTOMY    . TONSILLECTOMY      There were no vitals filed for this visit.      Subjective Assessment - 07/16/17 1818    Subjective No changes. Does not recall being given fall prevention handout (however when provided, pt then recalled that he had been given the information).    Patient Stated Goals To regain motivation for exercises   Currently in Pain? No/denies            [facility] PT Assessment - 07/16/17 0001      Ambulation/Gait   Gait velocity 20/4.28=4.67 ft/sec     Functional Gait  Assessment   Gait assessed  Yes   Gait Level Surface Walks 20 ft in less than 5.5 sec, no assistive devices, good speed, no evidence for imbalance, normal gait  pattern, deviates no more than 6 in outside of the 12 in walkway width.  4.28   Change in Gait Speed Able to smoothly change walking speed without loss of balance or gait deviation. Deviate no more than 6 in outside of the 12 in walkway width.   Gait with Horizontal Head Turns Performs head turns smoothly with no change in gait. Deviates no more than 6 in outside 12 in walkway width   Gait with Vertical Head Turns Performs head turns with no change in gait. Deviates no more than 6 in outside 12 in walkway width.   Gait and Pivot Turn Pivot turns safely within 3 sec and stops quickly with no loss of balance.   Step Over Obstacle Is able to step over 2 stacked shoe boxes taped together (9 in total height) without changing gait speed. No evidence of imbalance.   Gait with Narrow Base of Support Ambulates 4-7 steps.   Gait with Eyes Closed Walks 20 ft, no assistive devices, good speed, no evidence of imbalance, normal gait pattern, deviates no more than 6 in outside 12 in walkway width. Ambulates 20 ft in less than 7 sec.  5.81   Ambulating Backwards Walks 20 ft, no assistive devices, good speed, no evidence for imbalance, normal  gait   Steps Alternating feet, no rail.   Total Score 28                     [facility] Adult PT Treatment/Exercise - 07/16/17 0001      High Level Balance   High Level Balance Comments on red mat, step ups anteriorly and posteriorly with 8" step; cone taps intermixed with forwarding walking on red mat             Balance Exercises - 07/16/17 1825      Balance Exercises: Standing   Balance Beam black beam horizontally with feet together EO, EC for up to 30 sec no UE support; step off/on anteriorly and posteriorly    Step Over Hurdles / Cones over black beams, 1/2 roller, yoga blocks with single step between           PT Education - 07/16/17 1826    Education provided Yes   Education Details fall prevention handout; flooring choices for his home    Person(s) Educated Patient   Methods Explanation;Demonstration;Handout   Comprehension Verbalized understanding          PT Short Term Goals - 07/16/17 1819      PT SHORT TERM GOAL #1   Title Pt will be independent with HEP for improved posture, balance, functional mobility.  TARGET 07/17/17   Time 5   Period Weeks   Status Achieved     PT SHORT TERM GOAL #2   Title Pt will verbalize understanding of fall prevention in home environment.   Baseline 10/25 did not recall info given previous session; re-issued and discussed his wife's desire for new flooring and pro's/con's of carpet vs hardwood floors   Time 5   Period Weeks   Status On-going     PT SHORT TERM GOAL #3   Title Pt will improve push and release test to R side, to <2 steps independently regaining balance, for improved balance reactions.   Baseline 10/22   Time 5   Period Weeks   Status Achieved           PT Long Term Goals - 07/16/17 1831      PT LONG TERM GOAL #1   Title Pt will verbalize plans for continued community fitness upon d/c from PT.  TARGET 07/31/17   Time 7   Period Weeks   Status New     PT LONG TERM GOAL #2   Title Pt will improve Functional Gait Assessment to at least 22/30 for decreased fall risk.    Baseline 10/25 28/30   Time 7   Period Weeks   Status Achieved     PT LONG TERM GOAL #3   Title 6 Minute walk test to be performed, with pt walking 75 ft more than baseline distance, with exertion rating <15 on modified Borg scale.   Baseline Baseline:  1497 ft (rated 15-hard-exertion level)   Time 7   Period Weeks   Status New               Plan - 07/16/17 1827    Clinical Impression Statement Short term goals assessed with patient meeting 2 of 3 goals and 3rd goal ongoing. Remainder of session focused on pre-gait and gait training for improved step length and foot clearance. Patient is making progress and should continue to benefit from continued PT.    Rehab Potential Good    PT Frequency 2x / week   PT Duration  6 weeks  plus 1x/wk (eval); total POC= 7 weeks   PT Treatment/Interventions ADLs/Self Care Home Management;Gait training;Functional mobility training;Therapeutic activities;Therapeutic exercise;Balance training;Neuromuscular re-education;Patient/family education   PT Next Visit Plan PWR! standing for warm-up (?on red mat); stepping strategies, compliant surfaces;   Will likely need to discuss organizing exercises as part of comprehensive program, as PT and OT are adding exercises   Consulted and Agree with Plan of Care Patient      Patient will benefit from skilled therapeutic intervention in order to improve the following deficits and impairments:  Abnormal gait, Decreased balance, Decreased mobility, Difficulty walking, Postural dysfunction  Visit Diagnosis: Abnormality of gait  Unsteadiness on feet     Problem List Patient Active Problem List   Diagnosis Date Noted  . 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 Dysphagia, neurologic 07/27/2014  . Parkinson disease ([place]) 07/27/2014  . Dystonia 07/10/2014  . Low back pain 11/21/2011  . TRANSAMINASES, SERUM, ELEVATED 07/06/2008  . DIARRHEA 05/23/2008  . LYMPH NODE-ENLARGED 07/28/2007  . Non-Hodgkin lymphoma ([place]) 03/23/2007    Rexanne Mano, PT 07/16/2017, 6:43 PM  [facility] [address] [place], [place], [address] Phone: [phone]   Fax:  [phone]  Name: [name] [name] MRN: [id number] Date of Birth: 02/04/1951

## 2017-07-16 NOTE — Therapy (Signed)
[facility] [address] [place], [place], [address] Phone: [phone]   Fax:  [phone]  Occupational Therapy Treatment  Patient Details  Name: [name] [name] MRN: [id number] Date of Birth: 20-Feb-1951 Referring Provider: Dr. Wells Guiles Tat  Encounter Date: 07/16/2017      OT End of Session - 07/16/17 0807    Visit Number 7   Number of Visits 17   Date for OT Re-Evaluation 08/16/17   Authorization Type Medicare / BCBS;  needs G-code   Authorization - Visit Number 7   Authorization - Number of Visits 10   OT Start Time 0804   OT Stop Time 0845   OT Time Calculation (min) 41 min   Activity Tolerance Patient tolerated treatment well   Behavior During Therapy [facility] for tasks assessed/performed      Past Medical History:  Diagnosis Date  . Cervical lymphadenopathy    right - followed my ENT  . Dystonia 1994   diagnosed in [place]  . Non Hodgkin's lymphoma ([place]) 2007   diffuse- 6 cycles of chemo with R CHOP Rituxan - last dose 10/08  . Parkinson disease ([place])    2015    Past Surgical History:  Procedure Laterality Date  . CHOLECYSTECTOMY, LAPAROSCOPIC    . EYE SURGERY     x2 as child  . PORT-A-CATH REMOVAL    . PORTACATH PLACEMENT    . SPLENECTOMY    . TONSILLECTOMY      There were no vitals filed for this visit.      Subjective Assessment - 07/16/17 0807    Subjective  nothing new   Pertinent History Parkinson's disease, non-hodgkin lymphoma, dystonia, Meige syndrome   Patient Stated Goals to re-establish exercise routine and improve ADLs   Currently in Pain? No/denies      Completing Purdue Pegboard with each hand with min cueing for use of PWR! Hands and min difficulty/incr time for improved coordination.  Then removing with PWR! Hands.  Checked STGs and discussed progress. See goals section below.  Writing 5 sentences with 100% legibility and good size initially, but only 90% legibility  and mod decr in size by end.  Then practiced writing with cognitive component (category generation).  Instructed pt to think of what he wants to write prior to writing to add natural pauses and to decr divided attention.  Pt able to do so with noted improvement in size/legibility.  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    PWR! Hands x10, min cueing for incr amplitude in prep for writing/coordination tasks.  Reviewed importance of using PWR! Hands/big hand for grasp/release of objects during ADLs.                          OT Short Term Goals - 07/16/17 0808      OT SHORT TERM GOAL #1   Title Pt will be independent with updated PD specific HEP--check STGs 07/16/17   Time 4   Period Weeks   Status Achieved  07/16/17     OT SHORT TERM GOAL #2   Title Pt will be able to write at least 2 sentences with at least 75% legibility.   Baseline 25%   Time 4   Period Weeks   Status Achieved  07/16/17:  met 2 sentences at 100% legibility, 90% for 5 sentences     OT SHORT  TERM GOAL #3   Title Pt will demonstrate increased LUE functional use as evidenced by increasing box/ blocks score by 4 blocks.   Baseline R-50, L-45 blocks   Time 4   Period Weeks   Status On-going  07/16/17:  R-50, L-47blocks     OT SHORT TERM GOAL #4   Title Pt will verbalize understanding of ways to prevent future complications and appropriate community resources.   Time 4   Period Weeks   Status Achieved  07/16/17     OT SHORT TERM GOAL #5   Title ---------------           OT Long Term Goals - 07/16/17 0827      OT LONG TERM GOAL #1   Title Pt will verbalize understanding of large amplitude movement strategies to incr ease/efficiency with ADLs/IADLs.--check LTGs 08/16/17   Baseline --   Time 8   Period Weeks   Status New     OT LONG TERM GOAL #2   Title Pt will improve dominant R hand coordination for ADLs as  shown by improving time on 9-hole peg test by at least 3sec.   Baseline R-28.81sec, L-24.75sec   Time 8   Period Weeks   Status New     OT LONG TERM GOAL #3   Title Pt will be able to write at least 3 sentences with at least 90% legibility.--updated 07/16/17 to 5 sentences with 100% legibility   Baseline 25%   Time 8   Period Weeks   Status Revised  07/16/17:  met initial goal and updated.     OT LONG TERM GOAL #4   Title Pt will improve ability to fasten/unfasten 3 futtons in 22sec or less using BUEs.   Baseline 25.06sec with minimal use of R hand   Time 4   Period Weeks   Status New               Plan - 07/16/17 0808    Clinical Impression Statement Pt continues to progress towards goals with improving coordination and incr HEP performance.  Pt met 3/4 STGs and 1 LTG which was updated.   Rehab Potential Good   OT Frequency 2x / week   OT Duration 8 weeks  +eval   OT Treatment/Interventions Self-care/ADL training;Moist Heat;Fluidtherapy;DME and/or AE instruction;Patient/family education;Balance training;Therapeutic exercises;Ultrasound;Therapeutic exercise;Therapeutic activities;Cognitive remediation/compensation;Passive range of motion;Neuromuscular education;Cryotherapy;Parrafin;Energy conservation;Manual Therapy;Functional Mobility Training   Plan check on notes for [employer] note writing, add cognitive components when possible, review large amplitude movements for ADLs   OT Home Exercise Plan Education provided:  PWR! moves in quadraped (basic 4), coordination HEP, PT issued PWR! in sitting/standing   Consulted and Agree with Plan of Care Patient      Patient will benefit from skilled therapeutic intervention in order to improve the following deficits and impairments:  Abnormal gait, Decreased coordination, Impaired flexibility, Impaired UE functional use, Decreased knowledge of use of DME, Decreased balance, Decreased mobility, Decreased strength, Improper  spinal/pelvic alignment, Decreased cognition (bradykinesia)  Visit Diagnosis: Other symptoms and signs involving the nervous system  Other lack of coordination  Tremor, unspecified  Abnormal posture  Other abnormalities of gait and mobility  Attention and concentration deficit  Unsteadiness on feet    Problem List Patient Active Problem List   Diagnosis Date Noted  . 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 Dysphagia, neurologic 07/27/2014  . Parkinson disease ([place]) 07/27/2014  . Dystonia 07/10/2014  . Low back  pain 11/21/2011  . TRANSAMINASES, SERUM, ELEVATED 07/06/2008  . DIARRHEA 05/23/2008  . LYMPH NODE-ENLARGED 07/28/2007  . Non-Hodgkin lymphoma ([place]) 03/23/2007    [facility] 07/16/2017, 11:55 AM  [facility] [address] [place], [place], [address] Phone: [phone]   Fax:  [phone]  Name: [name] [name] MRN: [id number] Date of Birth: 17-Nov-1950   Vianne Bulls, OTR/L [facility] [address] [place], [place]  [address] [phone] phone [phone] 07/16/17 11:55 AM

## 2017-07-20 ENCOUNTER — Ambulatory Visit: Payer: Medicare Other | Admitting: Occupational Therapy

## 2017-07-20 ENCOUNTER — Ambulatory Visit: Payer: Medicare Other | Admitting: Physical Therapy

## 2017-07-20 DIAGNOSIS — R41840 Attention and concentration deficit: Secondary | ICD-10-CM

## 2017-07-20 DIAGNOSIS — R2681 Unsteadiness on feet: Secondary | ICD-10-CM | POA: Diagnosis not present

## 2017-07-20 DIAGNOSIS — R29818 Other symptoms and signs involving the nervous system: Secondary | ICD-10-CM

## 2017-07-20 DIAGNOSIS — R2689 Other abnormalities of gait and mobility: Secondary | ICD-10-CM

## 2017-07-20 DIAGNOSIS — R251 Tremor, unspecified: Secondary | ICD-10-CM | POA: Diagnosis not present

## 2017-07-20 DIAGNOSIS — R278 Other lack of coordination: Secondary | ICD-10-CM

## 2017-07-20 DIAGNOSIS — R293 Abnormal posture: Secondary | ICD-10-CM

## 2017-07-20 NOTE — Therapy (Signed)
[facility] [address] [place], [place], [address] Phone: [phone]   Fax:  [phone]  Physical Therapy Treatment  Patient Details  Name: [name] [name] MRN: [id number] Date of Birth: 1950/10/06 Referring Provider: Alonza Bogus, DO  Encounter Date: 07/20/2017      PT End of Session - 07/20/17 1238    Visit Number 8   Number of Visits 13   Date for PT Re-Evaluation 08/16/17   Authorization Type Medicare Traditional primary, BCBS secondary-GCODE every 10th visit   PT Start Time 0852   PT Stop Time 0930   PT Time Calculation (min) 38 min   Activity Tolerance Patient tolerated treatment well   Behavior During Therapy [facility] for tasks assessed/performed      Past Medical History:  Diagnosis Date  . Cervical lymphadenopathy    right - followed my ENT  . Dystonia 1994   diagnosed in [place]  . Non Hodgkin's lymphoma ([place]) 2007   diffuse- 6 cycles of chemo with R CHOP Rituxan - last dose 10/08  . Parkinson disease ([place])    2015    Past Surgical History:  Procedure Laterality Date  . CHOLECYSTECTOMY, LAPAROSCOPIC    . EYE SURGERY     x2 as child  . PORT-A-CATH REMOVAL    . PORTACATH PLACEMENT    . SPLENECTOMY    . TONSILLECTOMY      There were no vitals filed for this visit.      Subjective Assessment - 07/20/17 0853    Subjective I feel like I'm walking about it.  I feel that when I'm thinking about it, my posture is much better.  I'm more mindful of my posture.  No falls.   Patient Stated Goals To regain motivation for exercises   Currently in Pain? No/denies         Self Care:  Discussed/followed-up on fall prevention with handout, and he reports going through the handout with wife, with identifying need to fix carpet that is "bubbling up".  Pt discusses progress he feels he has made with therapy, including improved self-awareness with posture, improved balance and gait.  He also feels  attending PWR! MOves exercise class is helpful.  PT discussed LTGs and plans to continue to address progressions of HEP and walking distance towards LTGs.  Pt in agreement.          Neuro Re-education:           PWR [facility]) - 07/20/17 3329    PWR! exercises Moves in standing   PWR! Up x 20   PWR! Rock x 20   PWR! Twist x 20   PWR Step x 20   Comments on red mat, also with forward step and weightshift x 20, then back step and weightshift x 20          Balance Exercises - 07/20/17 0927      Balance Exercises: Standing   Balance Beam blue beam:  marching in place x 10, forward step taps x 10, back step taps x 10 reps, then forward/back step taps x 10 reps with intermittent UE support. Alternating forward kicks x 10 reps   Other Standing Exercises Standing on compliant surface on incline/decline:  marching in place x 10, alternating step taps x 10 reps, then head turns, head nods x 5 reps.  On red compliant surface, forward/back walking x 4 reps, forward/back marching x 4 reps, tandem gait forward and back x 3 reps, then tandem march  forward x 4 reps.  Min guard>supervision for safety             PT Short Term Goals - 07/20/17 1241      PT SHORT TERM GOAL #1   Title Pt will be independent with HEP for improved posture, balance, functional mobility.  TARGET 07/17/17   Time 5   Period Weeks   Status Achieved     PT SHORT TERM GOAL #2   Title Pt will verbalize understanding of fall prevention in home environment.   Baseline 10/25 did not recall info given previous session; re-issued and discussed his wife's desire for new flooring and pro's/con's of carpet vs hardwood floors   Time 5   Period Weeks   Status Achieved     PT SHORT TERM GOAL #3   Title Pt will improve push and release test to R side, to <2 steps independently regaining balance, for improved balance reactions.   Baseline 10/22   Time 5   Period Weeks   Status Achieved           PT Long Term  Goals - 07/16/17 1831      PT LONG TERM GOAL #1   Title Pt will verbalize plans for continued community fitness upon d/c from PT.  TARGET 07/31/17   Time 7   Period Weeks   Status New     PT LONG TERM GOAL #2   Title Pt will improve Functional Gait Assessment to at least 22/30 for decreased fall risk.    Baseline 10/25 28/30   Time 7   Period Weeks   Status Achieved     PT LONG TERM GOAL #3   Title 6 Minute walk test to be performed, with pt walking 75 ft more than baseline distance, with exertion rating <15 on modified Borg scale.   Baseline Baseline:  1497 ft (rated 15-hard-exertion level)   Time 7   Period Weeks   Status New               Plan - 07/20/17 1239    Clinical Impression Statement Worked on compliant surface and balance activities this visit, with pt improving stability and self-correcting posture throughout course of exercises.  Pt has met STG for fall prevention, as he reports he and wife had long discussion regarding fall prevention handout.  Pt will continue to benefit from skilled PT to address posture, balance and gait.   Rehab Potential Good   PT Frequency 2x / week   PT Duration 6 weeks  plus 1x/wk (eval); total POC= 7 weeks   PT Treatment/Interventions ADLs/Self Care Home Management;Gait training;Functional mobility training;Therapeutic activities;Therapeutic exercise;Balance training;Neuromuscular re-education;Patient/family education   PT Next Visit Plan Variations of PWR! MOves seated and standing for progression of HEP   Consulted and Agree with Plan of Care Patient      Patient will benefit from skilled therapeutic intervention in order to improve the following deficits and impairments:  Abnormal gait, Decreased balance, Decreased mobility, Difficulty walking, Postural dysfunction  Visit Diagnosis: Unsteadiness on feet  Abnormal posture  Other symptoms and signs involving the nervous system     Problem List Patient Active Problem List    Diagnosis Date Noted  . 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 Dysphagia, neurologic 07/27/2014  . Parkinson disease ([place]) 07/27/2014  . Dystonia 07/10/2014  . Low back pain 11/21/2011  . TRANSAMINASES, SERUM, ELEVATED 07/06/2008  . DIARRHEA 05/23/2008  . LYMPH NODE-ENLARGED  07/28/2007  . Non-Hodgkin lymphoma ([place]) 03/23/2007    Dennice Tindol W. 07/20/2017, 12:43 PM  Frazier Butt., PT   [facility] [address] [place], [place], [address] Phone: [phone]   Fax:  [phone]  Name: [name] [name] MRN: [id number] Date of Birth: 12/15/1950

## 2017-07-20 NOTE — Therapy (Signed)
[facility] [address] [place], [place], [address] Phone: [phone]   Fax:  [phone]  Occupational Therapy Treatment  Patient Details  Name: [name] [name] MRN: [id number] Date of Birth: 17-Apr-1951 Referring Provider: Dr. Wells Guiles Tat  Encounter Date: 07/20/2017      OT End of Session - 07/20/17 0806    Visit Number 8   Number of Visits 17   Date for OT Re-Evaluation 08/16/17   Authorization Type Medicare / BCBS;  needs G-code   Authorization - Visit Number 8   Authorization - Number of Visits 10   OT Start Time 0804   OT Stop Time 0845   OT Time Calculation (min) 41 min   Activity Tolerance Patient tolerated treatment well   Behavior During Therapy [facility] for tasks assessed/performed      Past Medical History:  Diagnosis Date  . Cervical lymphadenopathy    right - followed my ENT  . Dystonia 1994   diagnosed in [place]  . Non Hodgkin's lymphoma ([place]) 2007   diffuse- 6 cycles of chemo with R CHOP Rituxan - last dose 10/08  . Parkinson disease ([place])    2015    Past Surgical History:  Procedure Laterality Date  . CHOLECYSTECTOMY, LAPAROSCOPIC    . EYE SURGERY     x2 as child  . PORT-A-CATH REMOVAL    . PORTACATH PLACEMENT    . SPLENECTOMY    . TONSILLECTOMY      There were no vitals filed for this visit.      Subjective Assessment - 07/20/17 0806    Subjective  slowing down helped my writing   Pertinent History Parkinson's disease, non-hodgkin lymphoma, dystonia, Meige syndrome   Patient Stated Goals to re-establish exercise routine and improve ADLs   Currently in Pain? No/denies        Self Care:    Pt educated in general PD info related to decr awareness of movement, types of movement affected, and onset of symptoms.  Pt verbalized understanding.  Continued instruction in importance and  in use of large amplitude movements to prevent future complications related to PD.  Pt  verbalized understanding.  Discussed progress with writing.   Eating:  Instructed pt in strategies for eating including holding utensil in the middle vs. The end, scooting chair close to table, use of PWR! Hands, use of big intentional movements, and good posture.  Pt verbalized understanding.  Opening/closing bottles with use of large amplitude movement strategies with min cueing.   Dressing:   Simulated ADLs with bag with focus/min cues for large amplitude movements:  donning/doffing pants, pulling bag into hand for clothing adjustment/donning socks                    OT Education - 07/20/17 1304    Education Details Big movements with ADLs   Person(s) Educated Patient;Spouse   Methods Explanation;Demonstration;Handout   Comprehension Verbalized understanding;Returned demonstration          OT Short Term Goals - 07/16/17 0808      OT SHORT TERM GOAL #1   Title Pt will be independent with updated PD specific HEP--check STGs 07/16/17   Time 4   Period Weeks   Status Achieved  07/16/17     OT SHORT TERM GOAL #2   Title Pt will be able to write at least 2 sentences with at least 75% legibility.   Baseline 25%   Time 4   Period Weeks  Status Achieved  07/16/17:  met 2 sentences at 100% legibility, 90% for 5 sentences     OT SHORT TERM GOAL #3   Title Pt will demonstrate increased LUE functional use as evidenced by increasing box/ blocks score by 4 blocks.   Baseline R-50, L-45 blocks   Time 4   Period Weeks   Status On-going  07/16/17:  R-50, L-47blocks     OT SHORT TERM GOAL #4   Title Pt will verbalize understanding of ways to prevent future complications and appropriate community resources.   Time 4   Period Weeks   Status Achieved  07/16/17     OT SHORT TERM GOAL #5   Title ---------------           OT Long Term Goals - 07/16/17 0827      OT LONG TERM GOAL #1   Title Pt will verbalize understanding of large amplitude movement  strategies to incr ease/efficiency with ADLs/IADLs.--check LTGs 08/16/17   Baseline --   Time 8   Period Weeks   Status New     OT LONG TERM GOAL #2   Title Pt will improve dominant R hand coordination for ADLs as shown by improving time on 9-hole peg test by at least 3sec.   Baseline R-28.81sec, L-24.75sec   Time 8   Period Weeks   Status New     OT LONG TERM GOAL #3   Title Pt will be able to write at least 3 sentences with at least 90% legibility.--updated 07/16/17 to 5 sentences with 100% legibility   Baseline 25%   Time 8   Period Weeks   Status Revised  07/16/17:  met initial goal and updated.     OT LONG TERM GOAL #4   Title Pt will improve ability to fasten/unfasten 3 futtons in 22sec or less using BUEs.   Baseline 25.06sec with minimal use of R hand   Time 4   Period Weeks   Status New               Plan - 07/20/17 0807    Clinical Impression Statement Pt continues to prgress towards goals with improved understanding of large amplitude movement strategies for ADLs.   Rehab Potential Good   OT Frequency 2x / week   OT Duration 8 weeks  +eval   OT Treatment/Interventions Self-care/ADL training;Moist Heat;Fluidtherapy;DME and/or AE instruction;Patient/family education;Balance training;Therapeutic exercises;Ultrasound;Therapeutic exercise;Therapeutic activities;Cognitive remediation/compensation;Passive range of motion;Neuromuscular education;Cryotherapy;Parrafin;Energy conservation;Manual Therapy;Functional Mobility Training   Plan large amplitude movement strategies, add cognitive components when possible   OT Home Exercise Plan Education provided:  PWR! moves in quadraped (basic 4), coordination HEP, PT issued PWR! in sitting/standing   Consulted and Agree with Plan of Care Patient      Patient will benefit from skilled therapeutic intervention in order to improve the following deficits and impairments:  Abnormal gait, Decreased coordination, Impaired  flexibility, Impaired UE functional use, Decreased knowledge of use of DME, Decreased balance, Decreased mobility, Decreased strength, Improper spinal/pelvic alignment, Decreased cognition (bradykinesia)  Visit Diagnosis: Other symptoms and signs involving the nervous system  Unsteadiness on feet  Other lack of coordination  Tremor, unspecified  Abnormal posture  Other abnormalities of gait and mobility  Attention and concentration deficit    Problem List Patient Active Problem List   Diagnosis Date Noted  . 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 Dysphagia, neurologic 07/27/2014  . Parkinson disease ([place]) 07/27/2014  . Dystonia 07/10/2014  .  Low back pain 11/21/2011  . TRANSAMINASES, SERUM, ELEVATED 07/06/2008  . DIARRHEA 05/23/2008  . LYMPH NODE-ENLARGED 07/28/2007  . Non-Hodgkin lymphoma ([place]) 03/23/2007    [facility] 07/20/2017, 1:06 PM  [facility] [address] [place], [place], [address] Phone: [phone]   Fax:  [phone]  Name: YADRIEL KERRIGAN MRN: [id number] Date of Birth: Jun 24, 1951   Vianne Bulls, OTR/L [facility] [address] [place], [place]  [address] [phone] phone [phone] 07/20/17 1:09 PM

## 2017-07-23 ENCOUNTER — Ambulatory Visit: Payer: Medicare Other | Attending: Family Medicine | Admitting: Occupational Therapy

## 2017-07-23 ENCOUNTER — Ambulatory Visit: Payer: Medicare Other | Admitting: Physical Therapy

## 2017-07-23 DIAGNOSIS — R29818 Other symptoms and signs involving the nervous system: Secondary | ICD-10-CM | POA: Insufficient documentation

## 2017-07-23 DIAGNOSIS — R2681 Unsteadiness on feet: Secondary | ICD-10-CM | POA: Diagnosis not present

## 2017-07-23 DIAGNOSIS — R41840 Attention and concentration deficit: Secondary | ICD-10-CM | POA: Diagnosis not present

## 2017-07-23 DIAGNOSIS — R2689 Other abnormalities of gait and mobility: Secondary | ICD-10-CM

## 2017-07-23 DIAGNOSIS — R293 Abnormal posture: Secondary | ICD-10-CM | POA: Diagnosis not present

## 2017-07-23 DIAGNOSIS — R278 Other lack of coordination: Secondary | ICD-10-CM | POA: Diagnosis not present

## 2017-07-23 DIAGNOSIS — R251 Tremor, unspecified: Secondary | ICD-10-CM | POA: Insufficient documentation

## 2017-07-23 NOTE — Therapy (Signed)
[facility] [address] [place], [place], [address] Phone: [phone]   Fax:  [phone]  Occupational Therapy Treatment  Patient Details  Name: [name] [name] MRN: [id number] Date of Birth: 04/28/1951 Referring Provider: Dr. Wells Guiles Tat  Encounter Date: 07/23/2017      OT End of Session - 07/23/17 0840    Visit Number 9   Number of Visits 17   Date for OT Re-Evaluation 08/16/17   Authorization Type Medicare / BCBS;  needs G-code   Authorization - Visit Number 9   Authorization - Number of Visits 10   OT Start Time 0805   OT Stop Time 0845   OT Time Calculation (min) 40 min   Activity Tolerance Patient tolerated treatment well   Behavior During Therapy [facility] for tasks assessed/performed      Past Medical History:  Diagnosis Date  . Cervical lymphadenopathy    right - followed my ENT  . Dystonia 1994   diagnosed in [place]  . Non Hodgkin's lymphoma ([place]) 2007   diffuse- 6 cycles of chemo with R CHOP Rituxan - last dose 10/08  . Parkinson disease ([place])    2015    Past Surgical History:  Procedure Laterality Date  . CHOLECYSTECTOMY, LAPAROSCOPIC    . EYE SURGERY     x2 as child  . PORT-A-CATH REMOVAL    . PORTACATH PLACEMENT    . SPLENECTOMY    . TONSILLECTOMY      There were no vitals filed for this visit.      Subjective Assessment - 07/23/17 0838    Subjective  You have made my arms tired today.   Pertinent History Parkinson's disease, non-hodgkin lymphoma, dystonia, Meige syndrome   Patient Stated Goals to re-establish exercise routine and improve ADLs   Currently in Pain? No/denies        In sitting, functional reaching in ER/overhead with trunk rotation/wt. Shift To place large pegs in vertical pegboard with set-up for large amplitude movements and min cues for PWR! Hands and for head turn.  Manipulating 2 in hand for incr coordination with min difficulty.  Performed with conversation  for simple divided attention.  In quadruped, PWR! Twist with tossing scarves with focus on timing and PWR! Hands with min cueing.  Ambulating with tossing 1 scarf in each hand alternating with conversation for divided attention with min difficulty/cues for large amplitude movements with UEs.  In standing, tossing 2 scarves with therapist with min cueing for wt. Shift and large amplitude movements.  Sliding cards across table with PWR! Hands using each hand with min cueing.                       OT Short Term Goals - 07/16/17 0808      OT SHORT TERM GOAL #1   Title Pt will be independent with updated PD specific HEP--check STGs 07/16/17   Time 4   Period Weeks   Status Achieved  07/16/17     OT SHORT TERM GOAL #2   Title Pt will be able to write at least 2 sentences with at least 75% legibility.   Baseline 25%   Time 4   Period Weeks   Status Achieved  07/16/17:  met 2 sentences at 100% legibility, 90% for 5 sentences     OT SHORT TERM GOAL #3   Title Pt will demonstrate increased LUE functional use as evidenced by increasing box/ blocks score by 4  blocks.   Baseline R-50, L-45 blocks   Time 4   Period Weeks   Status On-going  07/16/17:  R-50, L-47blocks     OT SHORT TERM GOAL #4   Title Pt will verbalize understanding of ways to prevent future complications and appropriate community resources.   Time 4   Period Weeks   Status Achieved  07/16/17     OT SHORT TERM GOAL #5   Title ---------------           OT Long Term Goals - 07/16/17 0827      OT LONG TERM GOAL #1   Title Pt will verbalize understanding of large amplitude movement strategies to incr ease/efficiency with ADLs/IADLs.--check LTGs 08/16/17   Baseline --   Time 8   Period Weeks   Status New     OT LONG TERM GOAL #2   Title Pt will improve dominant R hand coordination for ADLs as shown by improving time on 9-hole peg test by at least 3sec.   Baseline R-28.81sec, L-24.75sec    Time 8   Period Weeks   Status New     OT LONG TERM GOAL #3   Title Pt will be able to write at least 3 sentences with at least 90% legibility.--updated 07/16/17 to 5 sentences with 100% legibility   Baseline 25%   Time 8   Period Weeks   Status Revised  07/16/17:  met initial goal and updated.     OT LONG TERM GOAL #4   Title Pt will improve ability to fasten/unfasten 3 futtons in 22sec or less using BUEs.   Baseline 25.06sec with minimal use of R hand   Time 4   Period Weeks   Status New               Plan - 07/23/17 0843    Clinical Impression Statement Pt continues to progress towards goals with oncly occasional min v.c. to slow down.   Rehab Potential Good   OT Frequency 2x / week   OT Duration 8 weeks  +eval   OT Treatment/Interventions Self-care/ADL training;Moist Heat;Fluidtherapy;DME and/or AE instruction;Patient/family education;Balance training;Therapeutic exercises;Ultrasound;Therapeutic exercise;Therapeutic activities;Cognitive remediation/compensation;Passive range of motion;Neuromuscular education;Cryotherapy;Parrafin;Energy conservation;Manual Therapy;Functional Mobility Training   Plan G-code next visit; large amplitude movements   OT Home Exercise Plan Education provided:  PWR! moves in quadraped (basic 4), coordination HEP, PT issued PWR! in sitting/standing   Consulted and Agree with Plan of Care Patient      Patient will benefit from skilled therapeutic intervention in order to improve the following deficits and impairments:  Abnormal gait, Decreased coordination, Impaired flexibility, Impaired UE functional use, Decreased knowledge of use of DME, Decreased balance, Decreased mobility, Decreased strength, Improper spinal/pelvic alignment, Decreased cognition (bradykinesia)  Visit Diagnosis: Other symptoms and signs involving the nervous system  Abnormal posture  Unsteadiness on feet  Other lack of coordination  Tremor, unspecified  Other  abnormalities of gait and mobility  Attention and concentration deficit    Problem List Patient Active Problem List   Diagnosis Date Noted  . 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 Dysphagia, neurologic 07/27/2014  . Parkinson disease ([place]) 07/27/2014  . Dystonia 07/10/2014  . Low back pain 11/21/2011  . TRANSAMINASES, SERUM, ELEVATED 07/06/2008  . DIARRHEA 05/23/2008  . LYMPH NODE-ENLARGED 07/28/2007  . Non-Hodgkin lymphoma ([place]) 03/23/2007    [facility] 07/23/2017, 10:45 AM  [facility] [address] [place], [place], [address]  Phone: [phone]   Fax:  [phone]  Name: [name] [name] MRN: [id number] Date of Birth: 1951/01/21   Vianne Bulls, OTR/L [facility] [address] [place],   [address] [phone] phone [phone] 07/23/17 10:45 AM

## 2017-07-23 NOTE — Therapy (Signed)
[facility] [address] [place], [place], [address] Phone: [phone]   Fax:  [phone]  Physical Therapy Treatment  Patient Details  Name: [name] [name] MRN: [id number] Date of Birth: Jan 11, 1951 Referring Provider: Alonza Bogus, DO  Encounter Date: 07/23/2017      PT End of Session - 07/23/17 1500    Visit Number 9   Number of Visits 13   Date for PT Re-Evaluation 08/16/17   Authorization Type Medicare Traditional primary, BCBS secondary-GCODE every 10th visit   PT Start Time 0850   PT Stop Time 0932   PT Time Calculation (min) 42 min   Activity Tolerance Patient tolerated treatment well   Behavior During Therapy [facility] for tasks assessed/performed      Past Medical History:  Diagnosis Date  . Cervical lymphadenopathy    right - followed my ENT  . Dystonia 1994   diagnosed in [place]  . Non Hodgkin's lymphoma ([place]) 2007   diffuse- 6 cycles of chemo with R CHOP Rituxan - last dose 10/08  . Parkinson disease ([place])    2015    Past Surgical History:  Procedure Laterality Date  . CHOLECYSTECTOMY, LAPAROSCOPIC    . EYE SURGERY     x2 as child  . PORT-A-CATH REMOVAL    . PORTACATH PLACEMENT    . SPLENECTOMY    . TONSILLECTOMY      There were no vitals filed for this visit.      Subjective Assessment - 07/23/17 0851    Subjective Class is great; it will wear you out, but great.  No other changes.   Patient Stated Goals To regain motivation for exercises   Currently in Pain? No/denies                         [facility] Adult PT Treatment/Exercise - 07/23/17 0001      Ambulation/Gait   Ambulation/Gait Yes   Ambulation/Gait Assistance 6: Modified independent (Device/Increase time)   Ambulation/Gait Assistance Details Outdoor gait activities on sidewalk, inclines/declines   Ambulation Distance (Feet) 1000 Feet   Assistive device None   Gait Pattern Step-through pattern;Decreased trunk  rotation   Ambulation Surface Unlevel;Outdoor   Gait Comments Gait on outdoor surfaces with conversational tasks-no LOB.  Pt reports he has increased walk time at home from 10 minutes, to 20-25 minutes and his goal is 30 minutes.             PWR [facility]) - 07/23/17 7106    PWR! exercises Moves in sitting;Moves in standing   PWR! Up Standing x 5 reps    PWR! Rock Standing x 5 reps each side   PWR! Twist Standing  x 5 reps each side   PWR Step Standing x 5 reps each side   Basic 4 Flow Standing PWR! Moves Flow on blue cushion, 5 reps.   Comments PWR! Moves in standing position; on blue foam cushion.   PWR! Sit to Stand PWR! Sit<>stand following by 5 reps each, Basic 4 Standing PWR! Moves   Basic 4 Flow sitting x 5 reps with verbal and visual cues for sequence    All PWR! Moves performed to address posture, weightshifting, trunk flexibility and step strategy; cues for slowed pace of exercise at times.      Balance Exercises - 07/23/17 1454      Balance Exercises: Standing   Stepping Strategy Anterior;Posterior  x 10 reps each   Other Standing Exercises  Varied direction stepping-forward/back/side, with added cognitive load-pt following visual cues, stating leg/directions out loud/calling out colors (instead of directions) while performing step and weightshift in varied directions.           PT Education - 07/23/17 1459    Education provided Yes   Education Details Variations/progressions of PWR! Moves exercises as a way to challenge with HEP; multi-directional stepping activities   Person(s) Educated Patient   Methods Explanation;Demonstration;Handout   Comprehension Verbalized understanding;Returned demonstration          PT Short Term Goals - 07/20/17 1241      PT SHORT TERM GOAL #1   Title Pt will be independent with HEP for improved posture, balance, functional mobility.  TARGET 07/17/17   Time 5   Period Weeks   Status Achieved     PT SHORT TERM GOAL #2   Title  Pt will verbalize understanding of fall prevention in home environment.   Baseline 10/25 did not recall info given previous session; re-issued and discussed his wife's desire for new flooring and pro's/con's of carpet vs hardwood floors   Time 5   Period Weeks   Status Achieved     PT SHORT TERM GOAL #3   Title Pt will improve push and release test to R side, to <2 steps independently regaining balance, for improved balance reactions.   Baseline 10/22   Time 5   Period Weeks   Status Achieved           PT Long Term Goals - 07/16/17 1831      PT LONG TERM GOAL #1   Title Pt will verbalize plans for continued community fitness upon d/c from PT.  TARGET 07/31/17   Time 7   Period Weeks   Status New     PT LONG TERM GOAL #2   Title Pt will improve Functional Gait Assessment to at least 22/30 for decreased fall risk.    Baseline 10/25 28/30   Time 7   Period Weeks   Status Achieved     PT LONG TERM GOAL #3   Title 6 Minute walk test to be performed, with pt walking 75 ft more than baseline distance, with exertion rating <15 on modified Borg scale.   Baseline Baseline:  1497 ft (rated 15-hard-exertion level)   Time 7   Period Weeks   Status New               Plan - 07/23/17 1500    Clinical Impression Statement Worked on varied direction stepping and variations of PWR! MOves exercises as options to challenge patient with HEP.  Pt needs occasional cues for slowed performance, but overall has good technique and no overt LOB with activities.  Pt is agreeable to checking LTGs and may plan for d/c from PT next visit.   Rehab Potential Good   PT Frequency 2x / week   PT Duration 6 weeks  plus eval, total POC = 7 weeks   PT Treatment/Interventions ADLs/Self Care Home Management;Gait training;Functional mobility training;Therapeutic activities;Therapeutic exercise;Balance training;Neuromuscular re-education;Patient/family education   PT Next Visit Plan Review HEP if needed;  check LTGs and may plan for d/c next visit; GCODE next visit   Consulted and Agree with Plan of Care Patient      Patient will benefit from skilled therapeutic intervention in order to improve the following deficits and impairments:  Abnormal gait, Decreased balance, Decreased mobility, Difficulty walking, Postural dysfunction  Visit Diagnosis: Unsteadiness on feet  Abnormal posture  Other abnormalities of gait and mobility     Problem List Patient Active Problem List   Diagnosis Date Noted  . 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 Dysphagia, neurologic 07/27/2014  . Parkinson disease ([place]) 07/27/2014  . Dystonia 07/10/2014  . Low back pain 11/21/2011  . TRANSAMINASES, SERUM, ELEVATED 07/06/2008  . DIARRHEA 05/23/2008  . LYMPH NODE-ENLARGED 07/28/2007  . Non-Hodgkin lymphoma ([place]) 03/23/2007    Filomena Pokorney W. 07/23/2017, 3:03 PM  Frazier Butt., PT   [facility] [address] [place], [place], [address] Phone: [phone]   Fax:  [phone]  Name: [name] [name] MRN: [id number] Date of Birth: 20-Aug-1951

## 2017-07-23 NOTE — Patient Instructions (Signed)
Variations of PWR! Moves:  1)   -Sitting PWR! Moves Flow:  PWR!Up>PWR! Rock>PWR! Twist>PWR! Step  -5-10 times through the full set  2)   -Sit to stand followed by PWR! UP x 5-10 reps     -Sit to stand followed by PWR! Rock x 5-10 reps  -Sit to stand followed by [employer]! Twist x 5-10 reps  -Sit to stand followed by PWR! Step x 5-10 reps  3)   -Standing PWR! Moves performed standing on pillow, towel, or cushion surface  4) -Standing PWR! Moves Flow:  PWR! Up>PWR! Rock>PWR! Twist>PWR! Step  -5-10 reps through the full set

## 2017-07-27 ENCOUNTER — Encounter: Payer: Self-pay | Admitting: Physical Therapy

## 2017-07-27 ENCOUNTER — Encounter: Payer: Self-pay | Admitting: Occupational Therapy

## 2017-07-27 ENCOUNTER — Ambulatory Visit: Payer: Medicare Other | Admitting: Physical Therapy

## 2017-07-27 ENCOUNTER — Ambulatory Visit: Payer: Medicare Other | Admitting: Occupational Therapy

## 2017-07-27 DIAGNOSIS — R251 Tremor, unspecified: Secondary | ICD-10-CM | POA: Diagnosis not present

## 2017-07-27 DIAGNOSIS — R2689 Other abnormalities of gait and mobility: Secondary | ICD-10-CM | POA: Diagnosis not present

## 2017-07-27 DIAGNOSIS — R278 Other lack of coordination: Secondary | ICD-10-CM

## 2017-07-27 DIAGNOSIS — R293 Abnormal posture: Secondary | ICD-10-CM

## 2017-07-27 DIAGNOSIS — R2681 Unsteadiness on feet: Secondary | ICD-10-CM

## 2017-07-27 DIAGNOSIS — R29818 Other symptoms and signs involving the nervous system: Secondary | ICD-10-CM | POA: Diagnosis not present

## 2017-07-27 DIAGNOSIS — R41840 Attention and concentration deficit: Secondary | ICD-10-CM

## 2017-07-27 NOTE — Therapy (Addendum)
[facility] [address] [place], [place], [address] Phone: [phone]   Fax:  [phone]  Occupational Therapy Treatment  Patient Details  Name: [name] [name] MRN: [id number] Date of Birth: 12/24/1950 Referring Provider: Dr. Wells Guiles Tat   Encounter Date: 07/27/2017  OT End of Session - 07/27/17 0807    Visit Number  10    Number of Visits  17    Date for OT Re-Evaluation  08/16/17    Authorization Type  Medicare / BCBS;  needs G-code    Authorization - Visit Number  10    Authorization - Number of Visits  10    OT Start Time  0804    OT Stop Time  0845    OT Time Calculation (min)  41 min    Activity Tolerance  Patient tolerated treatment well    Behavior During Therapy  [facility] for tasks assessed/performed       Past Medical History:  Diagnosis Date  . Cervical lymphadenopathy    right - followed my ENT  . Dystonia 1994   diagnosed in [employer]  . Non Hodgkin's lymphoma ([place]) 2007   diffuse- 6 cycles of chemo with R CHOP Rituxan - last dose 10/08  . Parkinson disease ([place])    2015    Past Surgical History:  Procedure Laterality Date  . CHOLECYSTECTOMY, LAPAROSCOPIC    . EYE SURGERY     x2 as child  . PORT-A-CATH REMOVAL    . PORTACATH PLACEMENT    . SPLENECTOMY    . TONSILLECTOMY      There were no vitals filed for this visit.  Subjective Assessment - 07/27/17 0806    Subjective   Pt reports that arms got a good workout.   Exercises are going well.    Pertinent History  Parkinson's disease, non-hodgkin lymphoma, dystonia, Meige syndrome    Patient Stated Goals  to re-establish exercise routine and improve ADLs    Currently in Pain?  No/denies        Completing purdue pegboards with each hand with use of PWR! Hands with min difficulty for coordination.   Functional step and reach forward/backward at a diagonal for wt. Shift/trunk rotation and PWR! Reach to place large pegs in vertical  pegboard with each UE.  Arm bike x25mn level 1 for reciprocal movement with cues/target of at least 40rpms for intensity while maintaining movement amplitude/reciprocal movement.   Pt maintained 38-40rpms.  Rotating relaxation balls in hand each direction with each hand with min difficulty.--improved.  PWR! Hands (up and step) with digi-extend with red resistance, both hands.  Pt instructed in using resistance to incr difficulty of HEP.  Pt verbalized understanding.                     OT Short Term Goals - 07/16/17 0808      OT SHORT TERM GOAL #1   Title  Pt will be independent with updated PD specific HEP--check STGs 07/16/17    Time  4    Period  Weeks    Status  Achieved 07/16/17   07/16/17     OT SHORT TERM GOAL #2   Title  Pt will be able to write at least 2 sentences with at least 75% legibility.    Baseline  25%    Time  4    Period  Weeks    Status  Achieved 07/16/17:  met 2 sentences at 100% legibility, 90% for  5 sentences   07/16/17:  met 2 sentences at 100% legibility, 90% for 5 sentences     OT SHORT TERM GOAL #3   Title  Pt will demonstrate increased LUE functional use as evidenced by increasing box/ blocks score by 4 blocks.    Baseline  R-50, L-45 blocks    Time  4    Period  Weeks    Status  On-going 07/16/17:  R-50, L-47blocks   07/16/17:  R-50, L-47blocks     OT SHORT TERM GOAL #4   Title  Pt will verbalize understanding of ways to prevent future complications and appropriate community resources.    Time  4    Period  Weeks    Status  Achieved 07/16/17   07/16/17     OT SHORT TERM GOAL #5   Title  ---------------        OT Long Term Goals - 07/27/17 1006      OT LONG TERM GOAL #1   Title  Pt will verbalize understanding of large amplitude movement strategies to incr ease/efficiency with ADLs/IADLs.--check LTGs 08/16/17    Baseline  --    Time  8    Period  Weeks    Status  On-going      OT LONG TERM GOAL #2   Title  Pt will  improve dominant R hand coordination for ADLs as shown by improving time on 9-hole peg test by at least 3sec.    Baseline  R-28.81sec, L-24.75sec    Time  8    Period  Weeks    Status  On-going      OT LONG TERM GOAL #3   Title  Pt will be able to write at least 3 sentences with at least 90% legibility.--updated 07/16/17 to 5 sentences with 100% legibility    Baseline  25%    Time  8    Period  Weeks    Status  Revised 07/16/17:  met initial goal and updated.   07/16/17:  met initial goal and updated.     OT LONG TERM GOAL #4   Title  Pt will improve ability to fasten/unfasten 3 futtons in 22sec or less using BUEs.    Baseline  25.06sec with minimal use of R hand    Time  4    Period  Weeks    Status  On-going            Plan - 07/27/17 0808    Clinical Impression Statement  Pt is making good progress towards goals and only needs occasional min v.c. for large amplitude movements/timing.   Pt would benefit from futher occupational therapy to continue to incr challenge of HEP and ensure calibration of movement amplitude in complex activities with divided attention.    Rehab Potential  Good    OT Frequency  2x / week    OT Duration  8 weeks +eval   +eval   OT Treatment/Interventions  Self-care/ADL training;Moist Heat;Fluidtherapy;DME and/or AE instruction;Patient/family education;Balance training;Therapeutic exercises;Ultrasound;Therapeutic exercise;Therapeutic activities;Cognitive remediation/compensation;Passive range of motion;Neuromuscular education;Cryotherapy;Parrafin;Energy conservation;Manual Therapy;Functional Mobility Training    Plan  continue with large amplitude movements, continue to add cognitive components and instruct pt in how to add difficulty    OT Home Exercise Plan  Education provided:  PWR! moves in quadraped (basic 4), coordination HEP, PT issued PWR! in sitting/standing    Consulted and Agree with Plan of Care  Patient       Patient will benefit from  skilled therapeutic  intervention in order to improve the following deficits and impairments:  Abnormal gait, Decreased coordination, Impaired flexibility, Impaired UE functional use, Decreased knowledge of use of DME, Decreased balance, Decreased mobility, Decreased strength, Improper spinal/pelvic alignment, Decreased cognition(bradykinesia)  Visit Diagnosis: Other symptoms and signs involving the nervous system  Other lack of coordination  Tremor, unspecified  Attention and concentration deficit  Other abnormalities of gait and mobility  Abnormal posture  Unsteadiness on feet  G-Codes - 08/16/17 1007    Functional Assessment Tool Used (Outpatient only)  3 button / unbutton: with improved use of R hand, 90-100% legibility with writing,  Box and blocks:  L-47blocks    Self Care Current Status (L9532)  At least 20 percent but less than 40 percent impaired, limited or restricted    Self Care Goal Status (Y2334)  At least 1 percent but less than 20 percent impaired, limited or restricted       Problem List Patient Active Problem List   Diagnosis Date Noted  . 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 Meige syndrome (blepharospasm with oromandibular dystonia) 08-16-2014  . Dysphagia, neurologic 08-16-14  . Parkinson disease ([place]) August 16, 2014  . Dystonia 07/10/2014  . Low back pain 11/21/2011  . TRANSAMINASES, SERUM, ELEVATED 07/06/2008  . DIARRHEA 05/23/2008  . LYMPH NODE-ENLARGED 08-17-07  . Non-Hodgkin lymphoma ([place]) 03/23/2007   Occupational Therapy Progress Note  Dates of Reporting Period: 06/17/17 to 08/16/2017  Objective Reports of Subjective Statement: see above   Objective Measurements: see above  Goal Update: see above  Plan: see above  Reason Skilled Services are Required: see above     [facility] 2017/08/16, 10:11 AM  [facility] [address] [place], [place], [address] Phone: [phone]   Fax:   [phone]  Name: TRAESON DUSZA MRN: [id number] Date of Birth: 03-May-1951   Vianne Bulls, OTR/L [facility] [address] [place], [place]  [address] [phone] phone [phone] 08/16/2017 10:11 AM

## 2017-07-27 NOTE — Therapy (Signed)
[facility] [address] [place], [place], [address] Phone: [phone]   Fax:  [phone]  Physical Therapy Treatment  Patient Details  Name: [name] [name] MRN: [id number] Date of Birth: 11-18-1950 Referring Provider: Alonza Bogus, DO   Encounter Date: 07/27/2017  PT End of Session - 07/27/17 1357    Visit Number  10    Number of Visits  13    Date for PT Re-Evaluation  08/16/17    Authorization Type  Medicare Traditional primary, BCBS secondary-GCODE every 10th visit    PT Start Time  0852    PT Stop Time  0931    PT Time Calculation (min)  39 min    Activity Tolerance  Patient tolerated treatment well    Behavior During Therapy  [facility] for tasks assessed/performed       Past Medical History:  Diagnosis Date  . Cervical lymphadenopathy    right - followed my ENT  . Dystonia 1994   diagnosed in [place]  . Non Hodgkin's lymphoma ([place]) 2007   diffuse- 6 cycles of chemo with R CHOP Rituxan - last dose 10/08  . Parkinson disease ([place])    2015    Past Surgical History:  Procedure Laterality Date  . CHOLECYSTECTOMY, LAPAROSCOPIC    . EYE SURGERY     x2 as child  . PORT-A-CATH REMOVAL    . PORTACATH PLACEMENT    . SPLENECTOMY    . TONSILLECTOMY      There were no vitals filed for this visit.  Subjective Assessment - 07/27/17 1350    Subjective  Had a good weekend.  I like the variations of the exercises, but the seated PWR! Flow is the hardest for me to get.    Patient Stated Goals  To regain motivation for exercises    Currently in Pain?  No/denies         [facility] PT Assessment - 07/27/17 0001      6 Minute walk- Post Test   6 Minute Walk Post Test  yes    HR (bpm)  72    02 Sat (%RA)  96 %    Perceived Rate of Exertion (Borg)  15- Hard      6 minute walk test results    Aerobic Endurance Distance Walked  1537    Endurance additional comments  Improved distance by 40 ft                   [facility] Adult PT Treatment/Exercise - 07/27/17 0001      Ambulation/Gait   Ambulation/Gait  Yes    Ambulation/Gait Assistance  7: Independent    Ambulation/Gait Assistance Details  Outdoor gait activities on sidewalk surfaces    Ambulation Distance (Feet)  1700 Feet    Assistive device  None    Gait Pattern  Step-through pattern;Decreased trunk rotation    Ambulation Surface  Unlevel;Outdoor      Self-Care   Self-Care  Other Self-Care Comments    Other Self-Care Comments   Discussed progress towards goals, plans for d/c this visit.  Discussed return screen versus eval in 6-9 months (to be scheduled when OT discharges).  Discussed community fitness options, including indoor options for walking, visiting [facility] and contacting them about [employer] cycling class.  Pt in agreement of the above.        [facility]) - 07/27/17 0920    PWR! exercises  Moves in sitting Sit<>stand with PWR! Moves  Sit<>stand with PWR! Moves   PWR! Sit to Stand  PWR! Sit<>stand x 5 reps each, Basic 4 STanding Moves    Basic 4 Flow  x 5 reps with min verbal and visual cues       Balance Exercises - 07/27/17 1352      Balance Exercises: Standing   Other Standing Exercises  Varied direction stepping-forward/back/side, with added cognitive load-pt following visual cues, stating leg/directions out loud/calling out colors (instead of directions) while performing step and weightshift in varied directions.        PT Education - 07/27/17 1357    Education provided  Yes    Education Details  Review of HEP; community fitness options, plans for d/c this visit    Person(s) Educated  Patient    Methods  Explanation;Demonstration;Handout    Comprehension  Verbalized understanding;Returned demonstration       PT Short Term Goals - 07/20/17 1241      PT SHORT TERM GOAL #1   Title  Pt will be independent with HEP for improved posture, balance, functional mobility.  TARGET 07/17/17     Time  5    Period  Weeks    Status  Achieved      PT SHORT TERM GOAL #2   Title  Pt will verbalize understanding of fall prevention in home environment.    Baseline  10/25 did not recall info given previous session; re-issued and discussed his wife's desire for new flooring and pro's/con's of carpet vs hardwood floors    Time  5    Period  Weeks    Status  Achieved      PT SHORT TERM GOAL #3   Title  Pt will improve push and release test to R side, to <2 steps independently regaining balance, for improved balance reactions.    Baseline  10/22    Time  5    Period  Weeks    Status  Achieved        PT Long Term Goals - 07/27/17 1358      PT LONG TERM GOAL #1   Title  Pt will verbalize plans for continued community fitness upon d/c from PT.  TARGET 07/31/17    Time  7    Period  Weeks    Status  Achieved      PT LONG TERM GOAL #2   Title  Pt will improve Functional Gait Assessment to at least 22/30 for decreased fall risk.     Baseline  10/25 28/30    Time  7    Period  Weeks    Status  Achieved      PT LONG TERM GOAL #3   Title  6 Minute walk test to be performed, with pt walking 75 ft more than baseline distance, with exertion rating <15 on modified Borg scale.    Baseline  Baseline:  1497 ft (rated 15-hard-exertion level); 1537 ft on 07/27/17    Time  7    Period  Weeks    Status  Not Met            Plan - 07/27/17 1358    Clinical Impression Statement  Checked LTGs this visit, with pt meeting 2 of 3 LTGs.  Pt overall demonstrates improved postural awareness, consistent performance of HEP and improved functional mobility scores.  Pt appears appropriate for discharge from PT at this time.    Rehab Potential  Good    PT Frequency  2x / week  PT Duration  6 weeks plus eval, total POC = 7 weeks   plus eval, total POC = 7 weeks   PT Treatment/Interventions  ADLs/Self Care Home Management;Gait training;Functional mobility training;Therapeutic activities;Therapeutic  exercise;Balance training;Neuromuscular re-education;Patient/family education    PT Next Visit Plan  Discharge this visit.  Plan for eval/screen in 6 months.    Consulted and Agree with Plan of Care  Patient       Patient will benefit from skilled therapeutic intervention in order to improve the following deficits and impairments:  Abnormal gait, Decreased balance, Decreased mobility, Difficulty walking, Postural dysfunction  Visit Diagnosis: Other abnormalities of gait and mobility  Abnormal posture  Unsteadiness on feet   G-Codes - 08/21/2017 1405    Functional Assessment Tool Used (Outpatient Only)  FGA 28/30; 1537 ft in 6 minute walk test    Functional Limitation  Mobility: Walking and moving around    Mobility: Walking and Moving Around Goal Status [phone])  At least 1 percent but less than 20 percent impaired, limited or restricted    Mobility: Walking and Moving Around Discharge Status [phone])  At least 1 percent but less than 20 percent impaired, limited or restricted       Problem List Patient Active Problem List   Diagnosis Date Noted  . 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 Meige syndrome (blepharospasm with oromandibular dystonia) Aug 21, 2014  . Dysphagia, neurologic 21-Aug-2014  . Parkinson disease ([place]) 21-Aug-2014  . Dystonia 07/10/2014  . Low back pain 11/21/2011  . TRANSAMINASES, SERUM, ELEVATED 07/06/2008  . DIARRHEA 05/23/2008  . LYMPH NODE-ENLARGED 08-22-07  . Non-Hodgkin lymphoma ([place]) 03/23/2007    Dorina Ribaudo W. 2017/08/21, 3:00 PM  Frazier Butt., PT   [facility] [address] [place], [place], [address] Phone: [phone]   Fax:  [phone]  Name: [name] [name] MRN: [id number] Date of Birth: 28-Nov-1950   PHYSICAL THERAPY DISCHARGE SUMMARY  Visits from Start of Care: 10  Current functional level related to goals / functional outcomes: PT Long Term Goals - Aug 21, 2017 1358      PT  LONG TERM GOAL #1   Title  Pt will verbalize plans for continued community fitness upon d/c from PT.  TARGET 07/31/17    Time  7    Period  Weeks    Status  Achieved      PT LONG TERM GOAL #2   Title  Pt will improve Functional Gait Assessment to at least 22/30 for decreased fall risk.     Baseline  10/25 28/30    Time  7    Period  Weeks    Status  Achieved      PT LONG TERM GOAL #3   Title  6 Minute walk test to be performed, with pt walking 75 ft more than baseline distance, with exertion rating <15 on modified Borg scale.    Baseline  Baseline:  1497 ft (rated 15-hard-exertion level); 1537 ft on 2017/08/21    Time  7    Period  Weeks    Status  Not Met      Pt has met 2 of 3 LTGs;  Pt has met 3 of 3 STGs.     Remaining deficits: Posture, bradykinesia, high level balance-all improving   Education / Equipment: Educated in [employer], community fitness options.  Plan: Patient agrees to discharge.  Patient goals were met. Patient is being discharged due to being pleased with the current functional level.  ?????Recommend PT screen or eval  in 6-9 months due to progressive nature of disease.    Mady Haagensen, PT 07/27/17 3:03 PM Phone: [phone] Fax: [phone]

## 2017-07-30 ENCOUNTER — Encounter: Payer: Medicare Other | Admitting: Occupational Therapy

## 2017-07-30 ENCOUNTER — Ambulatory Visit: Payer: Medicare Other | Admitting: Physical Therapy

## 2017-08-03 ENCOUNTER — Ambulatory Visit: Payer: Medicare Other | Admitting: Physical Therapy

## 2017-08-04 ENCOUNTER — Ambulatory Visit: Payer: Medicare Other | Admitting: Occupational Therapy

## 2017-08-04 ENCOUNTER — Encounter: Payer: Self-pay | Admitting: Occupational Therapy

## 2017-08-04 DIAGNOSIS — R278 Other lack of coordination: Secondary | ICD-10-CM

## 2017-08-04 DIAGNOSIS — R29818 Other symptoms and signs involving the nervous system: Secondary | ICD-10-CM

## 2017-08-04 DIAGNOSIS — R293 Abnormal posture: Secondary | ICD-10-CM

## 2017-08-04 DIAGNOSIS — R2689 Other abnormalities of gait and mobility: Secondary | ICD-10-CM

## 2017-08-04 DIAGNOSIS — R251 Tremor, unspecified: Secondary | ICD-10-CM

## 2017-08-04 DIAGNOSIS — R2681 Unsteadiness on feet: Secondary | ICD-10-CM | POA: Diagnosis not present

## 2017-08-04 DIAGNOSIS — R41840 Attention and concentration deficit: Secondary | ICD-10-CM

## 2017-08-04 NOTE — Therapy (Signed)
[facility] [address] [place], [place], [address] Phone: [phone]   Fax:  [phone]  Occupational Therapy Treatment  Patient Details  Name: [name] [name] MRN: [id number] Date of Birth: 1951-04-12 Referring Provider: Dr. Wells Guiles Tat   Encounter Date: 08/04/2017  OT End of Session - 08/04/17 0943    Visit Number  11    Number of Visits  17    Date for OT Re-Evaluation  08/16/17    Authorization Type  Medicare / BCBS;  needs G-code    Authorization - Visit Number  11    Authorization - Number of Visits  20    OT Start Time  0802    OT Stop Time  4967    OT Time Calculation (min)  42 min    Activity Tolerance  Patient tolerated treatment well       Past Medical History:  Diagnosis Date  . Cervical lymphadenopathy    right - followed my ENT  . Dystonia 1994   diagnosed in [place]  . Non Hodgkin's lymphoma ([place]) 2007   diffuse- 6 cycles of chemo with R CHOP Rituxan - last dose 10/08  . Parkinson disease ([place])    2015    Past Surgical History:  Procedure Laterality Date  . CHOLECYSTECTOMY, LAPAROSCOPIC    . EYE SURGERY     x2 as child  . PORT-A-CATH REMOVAL    . PORTACATH PLACEMENT    . SPLENECTOMY    . TONSILLECTOMY      There were no vitals filed for this visit.  Subjective Assessment - 08/04/17 0806    Subjective   My coordination is better    Pertinent History  Parkinson's disease, non-hodgkin lymphoma, dystonia, Meige syndrome    Patient Stated Goals  to re-establish exercise routine and improve ADLs    Currently in Pain?  No/denies                   OT Treatments/Exercises (OP) - 08/04/17 0001      ADLs   Writing  Addressed writing - pt able to write with 100% legibility for several sentences with cues to slow down and think big letters. Legibility also increased when pt did very brief stop in between letters.     ADL Comments  Checked STG and some LTG's - see goal  section for updates. Pt with improvement in several areas.       Fine Motor Coordination   Other Fine Motor Exercises  Fine motor tasks to address picking up and manipulating small objects - pt incorporated large open hand prior to picking up items.  Pt with minimal difficulty.        Neurological Re-education Exercises   Other Exercises 1  Large amplitude movement for hands with opening and closing as well as pushing large cards off table prior to participating in fine motor tasks. Pt also used intermittently during session and incorporated independently into tasks during session.                 OT Short Term Goals - 08/04/17 0940      OT SHORT TERM GOAL #1   Title  Pt will be independent with updated PD specific HEP--check STGs 07/16/17    Time  4    Period  Weeks    Status  Achieved 07/16/17      OT SHORT TERM GOAL #2   Title  Pt will be able to write at  least 2 sentences with at least 75% legibility.    Baseline  25%    Time  4    Period  Weeks    Status  Achieved 07/16/17:  met 2 sentences at 100% legibility, 90% for 5 sentences      OT SHORT TERM GOAL #3   Title  Pt will demonstrate increased LUE functional use as evidenced by increasing box/ blocks score by 4 blocks.    Baseline  R-50, L-45 blocks    Time  4    Period  Weeks    Status  Achieved 08/04/2017 R= 52, L= 53      OT SHORT TERM GOAL #4   Title  Pt will verbalize understanding of ways to prevent future complications and appropriate community resources.    Time  4    Period  Weeks    Status  Achieved 07/16/17      OT SHORT TERM GOAL #5   Title  ---------------        OT Long Term Goals - 08/04/17 0941      OT LONG TERM GOAL #1   Title  Pt will verbalize understanding of large amplitude movement strategies to incr ease/efficiency with ADLs/IADLs.--check LTGs 08/16/17    Baseline  --    Time  8    Period  Weeks    Status  On-going      OT LONG TERM GOAL #2   Title  Pt will improve dominant R  hand coordination for ADLs as shown by improving time on 9-hole peg test by at least 3sec.    Baseline  R-28.81sec, L-24.75sec    Time  8    Period  Weeks    Status  Achieved 08/04/2017 R= 24.85, L= 24.56      OT LONG TERM GOAL #3   Title  Pt will be able to write at least 3 sentences with at least 90% legibility.--updated 07/16/17 to 5 sentences with 100% legibility    Baseline  25%    Time  8    Period  Weeks    Status  Revised 07/16/17:  met initial goal and updated.      OT LONG TERM GOAL #4   Title  Pt will improve ability to fasten/unfasten 3 futtons in 22sec or less using BUEs.    Baseline  25.06sec with minimal use of R hand    Time  4    Period  Weeks    Status  On-going            Plan - 08/04/17 0942    Clinical Impression Statement  Pt progressing toward goals and demonstrates improvement in coordination and UE control for functional tasks.     Rehab Potential  Good    OT Frequency  2x / week    OT Duration  8 weeks    OT Treatment/Interventions  Self-care/ADL training;Moist Heat;Fluidtherapy;DME and/or AE instruction;Patient/family education;Balance training;Therapeutic exercises;Ultrasound;Therapeutic exercise;Therapeutic activities;Cognitive remediation/compensation;Passive range of motion;Neuromuscular education;Cryotherapy;Parrafin;Energy conservation;Manual Therapy;Functional Mobility Training    Plan  continue with large amplitude movements, continue to add cognitive components and instruct pt in how to add difficulty    Consulted and Agree with Plan of Care  Patient       Patient will benefit from skilled therapeutic intervention in order to improve the following deficits and impairments:  Abnormal gait, Decreased coordination, Impaired flexibility, Impaired UE functional use, Decreased knowledge of use of DME, Decreased balance, Decreased mobility, Decreased strength, Improper spinal/pelvic alignment, Decreased  cognition  Visit Diagnosis: Other  abnormalities of gait and mobility  Abnormal posture  Other lack of coordination  Other symptoms and signs involving the nervous system  Tremor, unspecified  Attention and concentration deficit    Problem List Patient Active Problem List   Diagnosis Date Noted  . 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 Dysphagia, neurologic 07/27/2014  . Parkinson disease ([place]) 07/27/2014  . Dystonia 07/10/2014  . Low back pain 11/21/2011  . TRANSAMINASES, SERUM, ELEVATED 07/06/2008  . DIARRHEA 05/23/2008  . LYMPH NODE-ENLARGED 07/28/2007  . Non-Hodgkin lymphoma ([place]) 03/23/2007    Quay Burow, OTR/L 08/04/2017, 9:45 AM  [facility] [address] [place], [place], [address] Phone: [phone]   Fax:  [phone]  Name: [name] [name] MRN: [id number] Date of Birth: 07-Apr-1951

## 2017-08-06 ENCOUNTER — Ambulatory Visit: Payer: Medicare Other | Admitting: Occupational Therapy

## 2017-08-06 ENCOUNTER — Ambulatory Visit: Payer: Medicare Other | Admitting: Physical Therapy

## 2017-08-10 ENCOUNTER — Ambulatory Visit: Payer: Medicare Other | Admitting: Occupational Therapy

## 2017-08-10 ENCOUNTER — Encounter: Payer: Self-pay | Admitting: Occupational Therapy

## 2017-08-10 DIAGNOSIS — R293 Abnormal posture: Secondary | ICD-10-CM | POA: Diagnosis not present

## 2017-08-10 DIAGNOSIS — R41840 Attention and concentration deficit: Secondary | ICD-10-CM

## 2017-08-10 DIAGNOSIS — R2681 Unsteadiness on feet: Secondary | ICD-10-CM

## 2017-08-10 DIAGNOSIS — R29818 Other symptoms and signs involving the nervous system: Secondary | ICD-10-CM | POA: Diagnosis not present

## 2017-08-10 DIAGNOSIS — R278 Other lack of coordination: Secondary | ICD-10-CM | POA: Diagnosis not present

## 2017-08-10 DIAGNOSIS — R251 Tremor, unspecified: Secondary | ICD-10-CM | POA: Diagnosis not present

## 2017-08-10 DIAGNOSIS — R2689 Other abnormalities of gait and mobility: Secondary | ICD-10-CM

## 2017-08-10 NOTE — Therapy (Signed)
[facility] [address] [place], [place], [address] Phone: [phone]   Fax:  [phone]  Occupational Therapy Treatment  Patient Details  Name: [name] [name] MRN: [id number] Date of Birth: [birth year] Referring Provider: Dr. Wells Guiles Tat   Encounter Date: 08/10/2017  OT End of Session - 08/10/17 0813    Visit Number  12    Number of Visits  17    Date for OT Re-Evaluation  08/16/17    Authorization Type  Medicare / BCBS;  needs G-code    Authorization - Visit Number  12    Authorization - Number of Visits  20    OT Start Time  0805    OT Stop Time  0849    OT Time Calculation (min)  44 min    Activity Tolerance  Patient tolerated treatment well    Behavior During Therapy  [facility] for tasks assessed/performed       Past Medical History:  Diagnosis Date  . Cervical lymphadenopathy    right - followed my ENT  . Dystonia 1994   diagnosed in [place]  . Non Hodgkin's lymphoma ([place]) 2007   diffuse- 6 cycles of chemo with R CHOP Rituxan - last dose 10/08  . Parkinson disease ([place])    2015    Past Surgical History:  Procedure Laterality Date  . CHOLECYSTECTOMY, LAPAROSCOPIC    . EYE SURGERY     x2 as child  . PORT-A-CATH REMOVAL    . PORTACATH PLACEMENT    . SPLENECTOMY    . TONSILLECTOMY      There were no vitals filed for this visit.  Subjective Assessment - 08/10/17 0807    Subjective   doing well    Pertinent History  Parkinson's disease, non-hodgkin lymphoma, dystonia, Meige syndrome    Patient Stated Goals  to re-establish exercise routine and improve ADLs    Currently in Pain?  No/denies        In sitting, functional reaching to place small pegs in vertical pegboard with each UE for high range functional reaching and coordination to copy design for cognitive component (good accuracy).  Removing using in-hand manipulation  PWR! Hands basic 4 in flow x5 each with min cueing for incr movement  amplitude and timing.  Reviewed use of flow to incr cognitive component.  Writing 5 sentences of pt's choice with min cueing initially for strategies to incorporate cognitive component.   Pt with good legibility and only min decr in size.  Dressing:   Practiced buttoning/unbuttoning shirt on table top with min cues for use of PWR! Hands prior to buttoning and use of deliberate/large amplitude movements after instruction and unfastening strategy.  Pt demo improvement with repetition and use of large amplitude movements.  Simulated ADLs with bag with focus/min cues for large amplitude movements and timing:  Donning/doffing pull-over shirt, donning/doffing pants, pulling shirt down in back, drying back.   Checked remaining goals and discussed progress.  Pt appropriate and agreed with discharge today due to pleased with progress.    Reviewed recommendation for screen in 6-97month and continuing with PWR! Moves class.  Also reviewed recommendation for cycle class and POP community group.  Pt verbalized understanding.               OT Short Term Goals - 08/04/17 0940      OT SHORT TERM GOAL #1   Title  Pt will be independent with updated PD specific HEP--check STGs 07/16/17  Time  4    Period  Weeks    Status  Achieved 07/16/17      OT SHORT TERM GOAL #2   Title  Pt will be able to write at least 2 sentences with at least 75% legibility.    Baseline  25%    Time  4    Period  Weeks    Status  Achieved 07/16/17:  met 2 sentences at 100% legibility, 90% for 5 sentences      OT SHORT TERM GOAL #3   Title  Pt will demonstrate increased LUE functional use as evidenced by increasing box/ blocks score by 4 blocks.    Baseline  R-50, L-45 blocks    Time  4    Period  Weeks    Status  Achieved 08/04/2017 R= 52, L= 53      OT SHORT TERM GOAL #4   Title  Pt will verbalize understanding of ways to prevent future complications and appropriate community resources.    Time  4     Period  Weeks    Status  Achieved 07/16/17      OT SHORT TERM GOAL #5   Title  ---------------        OT Long Term Goals - 08/10/17 0848      OT LONG TERM GOAL #1   Title  Pt will verbalize understanding of large amplitude movement strategies to incr ease/efficiency with ADLs/IADLs.--check LTGs 08/16/17    Baseline  --    Time  8    Period  Weeks    Status  Achieved      OT LONG TERM GOAL #2   Title  Pt will improve dominant R hand coordination for ADLs as shown by improving time on 9-hole peg test by at least 3sec.    Baseline  R-28.81sec, L-24.75sec    Time  8    Period  Weeks    Status  Achieved 08/04/2017 R= 24.85, L= 24.56      OT LONG TERM GOAL #3   Title  Pt will be able to write at least 3 sentences with at least 90% legibility.--updated 07/16/17 to 5 sentences with 100% legibility    Baseline  25%    Time  8    Period  Weeks    Status  Revised 07/16/17:  met initial goal and updated.      OT LONG TERM GOAL #4   Title  Pt will improve ability to fasten/unfasten 3 futtons in 22sec or less using BUEs.    Baseline  25.06sec with minimal use of R hand    Time  4    Period  Weeks    Status  Partially Met 08/10/17:  34.66sec, 21.69sec            Plan - 08/10/17 0813    Clinical Impression Statement  Pt demo good progress and is appropriate for d/c at this time.    Rehab Potential  Good    OT Frequency  2x / week    OT Duration  8 weeks    OT Treatment/Interventions  Self-care/ADL training;Moist Heat;Fluidtherapy;DME and/or AE instruction;Patient/family education;Balance training;Therapeutic exercises;Ultrasound;Therapeutic exercise;Therapeutic activities;Cognitive remediation/compensation;Passive range of motion;Neuromuscular education;Cryotherapy;Parrafin;Energy conservation;Manual Therapy;Functional Mobility Training    Plan  d/c OT, recommend screen in approx 6-70month    Consulted and Agree with Plan of Care  Patient       Patient will benefit from  skilled therapeutic intervention in order to improve the following deficits and  impairments:  Abnormal gait, Decreased coordination, Impaired flexibility, Impaired UE functional use, Decreased knowledge of use of DME, Decreased balance, Decreased mobility, Decreased strength, Improper spinal/pelvic alignment, Decreased cognition  Visit Diagnosis: Other abnormalities of gait and mobility  Abnormal posture  Other lack of coordination  Other symptoms and signs involving the nervous system  Tremor, unspecified  Attention and concentration deficit  Unsteadiness on feet  G-Codes - August 20, 2017 0911    Functional Assessment Tool Used (Outpatient only)  3 button / unbutton: with improved use of R hand 21.69sec, 100% legibility with writing and no decr in size,  Box and blocks:  L-53blocks    Functional Limitation  Self care    Self Care Goal Status ([id number])  At least 1 percent but less than 20 percent impaired, limited or restricted    Self Care Discharge Status [phone])  At least 1 percent but less than 20 percent impaired, limited or restricted       Problem List Patient Active Problem List   Diagnosis Date Noted  . 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 Dysphagia, neurologic 07/27/2014  . Parkinson disease ([place]) 07/27/2014  . Dystonia 07/10/2014  . Low back pain 11/21/2011  . TRANSAMINASES, SERUM, ELEVATED 07/06/2008  . DIARRHEA 05/23/2008  . LYMPH NODE-ENLARGED 07/28/2007  . Non-Hodgkin lymphoma ([place]) 03/23/2007    OCCUPATIONAL THERAPY DISCHARGE SUMMARY  Visits from Start of Care: 12  Current functional level related to goals / functional outcomes: See above   Remaining deficits: Bradykinesia/hypokinesia, timing difficulties, decr coordination, decr posture, decr balance for IADLs--all improved tremors   Education / Equipment: Pt was instructed in the following:  PD-specific HEP, adaptive strategies for  ADLs/IADLs, ways to prevent future complications, appropriate community resources.  Pt verbalized understanding of all education provided.    Plan: Patient agrees to discharge.  Patient goals were met. Patient is being discharged due to meeting the stated rehab goals.  And being pleased with current functional level.?????Pt would benefit from occupational therapy screen in approx 6-8 months to assess for need for further therapy/functional changes due to progressive nature of diagnosis.       [facility] Aug 20, 2017, 9:51 AM  [facility] [address] [place], [place], [address] Phone: [phone]   Fax:  [phone]  Name: [name] [name] MRN: [id number] Date of Birth: 1951-05-09   Vianne Bulls, OTR/L [facility] [address] [place], [place]  [address] [phone] phone [phone] 2017/08/20 9:51 AM

## 2017-08-11 ENCOUNTER — Ambulatory Visit: Payer: Medicare Other | Admitting: Occupational Therapy

## 2017-08-17 ENCOUNTER — Encounter: Payer: Medicare Other | Admitting: Occupational Therapy

## 2017-08-20 ENCOUNTER — Encounter: Payer: Medicare Other | Admitting: Occupational Therapy

## 2017-10-27 NOTE — Progress Notes (Signed)
[name] [name] was seen today in the movement disorders clinic for neurologic consultation at the request of [name], DO.  The consultation is for the evaluation of tremor.  This patient is accompanied in the office by his spouse who supplements the history.    The pt was dx with dystonia in [place] in 1994.  He presented to the speech pathologist and was dx with spasmodic dysphonia.  He was then referred to the neurologist who confirmed the diagnosis and did and MRI of the brain that was normal.  He had botox in to the vocal cords and that was helpful.  He would lose the voice for 6 weeks after the injections and then would get the voice back for 6 weeks.  He would go back to [place] twice per year for the injections.  He hasn't had the injections for the last 2 years as he quit working.  The spasmodic dysphonia is worse on the phone.  Last ST was 1995.  Having some trouble with swallowing.  Had developed some movements in the face and tried botox there but didn't really help there.  Has had some neck extension as well with the dystonic movements.  Wife states that these sx's have been better since retirement; he is under less stress.  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  The patient reports that [name] tremor started in the thumb in may and has slowly gotten worse and 3 weeks ago, he noted just a little in the [name].  He notes the tremor most at rest.  09/26/14 update:  The patient returns today for follow-up.  The patient has a history of Meige's syndrome, but presented last time with parkinsonism, but did not meet all the criteria for idiopathic Parkinson's disease.  Nonetheless, the patient wanted to start on medication.  We started him on Mirapex ER and he has worked his way up to 1.5 mg daily.  The patient reports that he is about 50% better.  He states that it has helped tremor but it isn't gone.  His wife thinks that mirapex is disrupting his sleep.   He has constipation and  thought that it was the mirapex.  His balance has been good.   He had an MRI of the brain since last visit and that was unremarkable.  Because of significant dysphagia from his Meige syndrome he had a modified barium swallow on 08/07/2014 and that revealed mild oral dysphagia, moderate pharyngeal phase dysphagia and a regular diet with thin liquids was recommended.   Has a strange sensation under the bottom of the foot like something is under the ball of the foot.  Has had paresthesias off and on for a long time.  Did get chemo with nonHodgkins and been on B6 since.  01/02/15 update:  The patient is following up today, accompanied by his wife is supplements the history.  The patient is on Mirapex ER, 1.5 mg daily.  Overall, the patient states that he is doing well.  No falls.  No hallucinations.  He continues to have issues with dysphagia, but that is primarily from his long history of Meige syndrome.  He continues to not need much sleep at night but it is not transitioning into daytime sleepiness.    05/03/15 update:  The patient returns today for follow-up, accompanied by his wife who supplements the history.  The patient is on pramipexole ER, 1.5 mg daily. He is doing well on it. He states  that he was exercising but he "fell off the wagon."   He was referred last visit for physical and speech therapy. He states that he never got a call in that regard.   He does have a history of Meige syndrome, which is the primarily etiology for the dysphagia and some of the speech issues.  Last visit, I did ask him to add B12 supplementation, as his B12 was on the low end of normal.  He has been taking his B12 supplement faithfully.  He already he has peripheral neuropathy because of his history of chemotherapy.   Pt states that he is sleeping better than last visit.    09/03/15 update:  The patient follows up today, accompanied by his wife who supplements the history.  He remains on pramipexole ER, 1.5 mg daily.  He has  completed physical therapy and remains in speech therapy.  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  He is doing his PT at home.  Hasn't been faithful with CV exercise.  Has just started melatonin for insomnia and thinks it is helping.   Overall, the patient has been stable.  He has not experienced any falls.  No hallucinations.  No lightheadedness or near syncope.  He has not choked on any of his foods.  His wife is noting some short term memory issues - he forgot to shut garage door and one time forgot to take mirapex.  Pt noted that he had bad RLS when he did that.    01/08/16 update:  The patient follows up today, accompanied by his wife who supplements the history.  He is on Mirapex ER, 1.5 g daily.  He denies compulsive behaviors.  No falls since last visit.  He does have tremor, but not bothersome enough to change his medication.  No hallucinations.  No lightheadedness or near syncope.  He did have a modified barium swallow on 10/18/2015.  There is mild oral and moderate pharyngeal dysphagia.  This was worse with solids than liquids.  The patient felt that this was actually better than his last study.  There is no dietary changes recommended interventions recommended that he eat slowly, take small bites and drink plenty of liquids after his small solid bites.  04/11/16 update:  The patient follows up today, accompanied by his wife who supplements the history.  He is on Mirapex ER, 1.5 g daily.  He denies compulsive behaviors.  No falls since last visit.  He does have tremor, but not bothersome enough to change his medication.  No hallucinations.  No lightheadedness or near syncope.  He did have his therapy screens and while physical therapy was not recommended, occupational and speech therapy were and he will be starting those soon.  Not exercising.    07/17/16 update:  The patient is seen today, accompanied by his wife who supplements the  history.  He is on pramipexole ER, 1.5 mg daily.  He reports that he is doing well.  He continues to have some tremor, but does not want to change his medications.  He denies any falls.  He denies hallucinations.  He denies compulsive behaviors.  He denies lightheadedness or near syncope.  He is now faithfully exercising for 2 months.  His doctor told him that he had to clean up his diet and start exercising and he did this.  He is physically feeling better.  11/21/16 update:  Patient seen today, accompanied by his wife  who supplements the history.  Patient remains on pramipexole ER, 1.5 mg daily.  Patient reports that he has been stable.  Biggest issue is tremor, but that has not changed.  He denies any falls.  Denies compulsive behaviors.  Denies sleep attacks.  Denies lightheadedness or near syncope.  Regarding exercise, he states "I was going great for a few months and then I got off the wagon."  States that his diet has been good and his cholesterol was 25% improved.    04/27/17 update:  Patient seen today in follow-up, accompanied by his wife who supplements the history.  Patient is on pramipexole ER, 1.5 mg daily.  Continues to have tremor, but that has been stable.  Pt denies falls.  Occasionally off balance at the turns.  Pt denies lightheadedness, near syncope.  No hallucinations.  Mood has been good.  In regards to exercise, he states that he is planning to start the [employer] class next week.  His diet has been fairly good.  He is taking his B12 supplement.    10/29/17 update: Patient is seen today in follow-up.  He is accompanied by his wife who supplements the history.  Patient has a history of both Meige syndrome as well as Parkinson's disease.  He is on pramipexole ER, 1.5 mg daily.  Overall, the patient has been stable since our last visit.  The records that were made available to me were reviewed.  Went to PT since our last visit.  He is doing [employer] class and he likes that.  Swallowing has been  "better."  No falls but balance may not be as good.  Tremor has been about the same.  Wife asks about memory.  Worse when patients wife is sick and he has to do all the chores.  Issue is multitasking.   ALLERGIES:  No Known Allergies  CURRENT MEDICATIONS:  Outpatient Encounter Medications as of 10/29/2017  Medication Sig  . aspirin 81 MG chewable tablet Chew 81 mg by mouth daily.  . Cholecalciferol (VITAMIN D3) 3000 UNITS TABS Take 1,000 Units by mouth.   . cyanocobalamin 100 MCG tablet Take 1,000 mcg by mouth daily.   . Melatonin 3 MG TABS Take by mouth at bedtime.  . Pramipexole Dihydrochloride 1.5 MG TB24 Take 1 tablet (1.5 mg total) by mouth daily.   No facility-administered encounter medications on file as of 10/29/2017.     PAST MEDICAL HISTORY:   Past Medical History:  Diagnosis Date  . Cervical lymphadenopathy    right - followed my ENT  . Dystonia 1994   diagnosed in [place]  . Non Hodgkin's lymphoma ([facility]) 2007   diffuse- 6 cycles of chemo with R CHOP Rituxan - last dose 10/08  . Parkinson disease ([facility])    2015    PAST SURGICAL HISTORY:   Past Surgical History:  Procedure Laterality Date  . CHOLECYSTECTOMY, LAPAROSCOPIC    . EYE SURGERY     x2 as child  . PORT-A-CATH REMOVAL    . PORTACATH PLACEMENT    . SPLENECTOMY    . TONSILLECTOMY      SOCIAL HISTORY:   Social History   Socioeconomic History  . Marital status: Married    Spouse name: Not on file  . Number of children: Not on file  . Years of education: Not on file  . Highest education level: Not on file  Social Needs  . Financial resource strain: Not on file  . Food insecurity - worry: Not  on file  . Food insecurity - inability: Not on file  . Transportation needs - medical: Not on file  . Transportation needs - non-medical: Not on file  Occupational History  . Not on file  Tobacco Use  . Smoking status: Never Smoker  . Smokeless tobacco: Never Used  Substance and Sexual Activity  . Alcohol use:  No    Alcohol/week: 0.0 oz  . Drug use: No  . Sexual activity: Not on file  Other Topics Concern  . Not on file  Social History Narrative  . Not on file    FAMILY HISTORY:   Family Status  Relation Name Status  . Mother  Deceased       heart disease, lung cancer  . Father  Deceased       heart attack  . Brother half Deceased       colon cancer  . Sister half Alive       unknown  . Son  Alive       anemia  . Son  Alive       healthy  . Daughter  Alive       healthy  . Son  Deceased       debrancher's enzyme disease  . Son  Deceased       debrancher's enzyme disease    ROS:  A complete 10 system review of systems was obtained and was unremarkable apart from what is mentioned above.  PHYSICAL EXAMINATION:    VITALS:   Vitals:   10/29/17 0811  BP: 122/68  Pulse: 68  SpO2: 97%  Weight: 176 lb (79.8 kg)  Height: 5\' 6"  (1.676 m)   Wt Readings from Last 3 Encounters:  10/29/17 176 lb (79.8 kg)  07/02/17 169 lb (76.7 kg)  04/27/17 168 lb (76.2 kg)     GEN:  The patient appears stated age and is in NAD. HEENT:  Normocephalic, atraumatic.  The mucous membranes are moist. The superficial temporal arteries are without ropiness or tenderness. CV:  RRR Lungs:  CTAB Neck/HEME:  There are no carotid bruits bilaterally.  Neurological examination:  Orientation: The patient is alert and oriented x3.  Cranial nerves: There is good facial symmetry.  Slight facial hypomimia.  Mild oromandibular dystonia. Pupils are equal round and reactive to light bilaterally. Fundoscopic exam reveals clear margins bilaterally. There is L exotropia (chronic). The visual fields are full to confrontational testing. The speech bulbar in quality and signifcant spasmodic dysphonia.   Soft palate rises symmetrically and there is no tongue deviation. Hearing is intact to conversational tone. Sensation: Sensation is intact to light touch throughout. Motor: Strength is at least antigravity x 4.    Shoulder shrug is equal and symmetric.  There is no pronator drift.   Movement examination: Tone: There is normal tone in the bilateral upper extremities.  The tone in the lower extremities is normal.  Abnormal movements: There is intermittent [name] resting.   Coordination:  There is no decremation, with any form of RAMS, including alternating supination and pronation of the forearm, hand opening and closing, finger taps, heel taps and toe taps. Gait and Station: The patient has no difficulty arising out of a deep-seated chair without the use of the hands. The patient's stride length is normal.  The patient has a negative pull test.     Lab Results  Component Value Date   VITAMINB12 292 09/26/2014   .   Chemistry      Component Value  Date/Time   NA 140 07/02/2017 0817   K 3.9 07/02/2017 0817   CL 104 07/02/2017 0817   CO2 31 07/02/2017 0817   BUN 13 07/02/2017 0817   CREATININE 1.03 07/02/2017 0817   CREATININE 0.94 09/26/2014 0925      Component Value Date/Time   CALCIUM 8.7 07/02/2017 0817   ALKPHOS 55 07/02/2017 0817   AST 22 07/02/2017 0817   ALT 34 07/02/2017 0817   BILITOT 0.8 07/02/2017 0817     Lab Results  Component Value Date   WBC 11.6 (H) 03/27/2015   HGB 15.9 03/27/2015   HCT 47.8 03/27/2015   MCV 97.7 03/27/2015   PLT 284.0 03/27/2015       ASSESSMENT/PLAN:  1.  Mild PD  -Even though he has prominent facial dystonia (meige syndrome), I don't think that this is related as this started in 1994.  He has found Mirapex ER 1.5 mg to be helpful.   He does think that it has helped tremor but not fully but we both decided it would not be worth increasing the dosage. He has no compulsive side effects with the Mirapex.   -information on [employer] cycle program  -wife concerned about memory change.  Pt refused neurocognitive testing.  Will think about that.   I saw no evidence of dementia 2.  Dysphagia  -primarily related to Meige syndrome.  -He did have a modified  barium swallow on 10/18/2015.  There is mild oral and moderate pharyngeal dysphagia.  This was worse with solids than liquids.   He thinks that this has improved since then 3.  Paresthesias.  -Probable due to peripheral neuropathy, which is likely due to his history of chemotherapy from non-Hodgkin's lymphoma.    -His B12 has been on the low end, but he has been faithfully taking a supplement.   4.  Follow up is anticipated in the next 4-6 months, sooner should new neurologic issues arise.  Much greater than 50% of this visit was spent in counseling and coordinating care.  Total face to face time:  25 min

## 2017-10-29 ENCOUNTER — Ambulatory Visit (INDEPENDENT_AMBULATORY_CARE_PROVIDER_SITE_OTHER): Payer: Medicare Other | Admitting: Neurology

## 2017-10-29 ENCOUNTER — Encounter: Payer: Self-pay | Admitting: Neurology

## 2017-10-29 VITALS — BP 122/68 | HR 68 | Ht 66.0 in | Wt 176.0 lb

## 2017-10-29 DIAGNOSIS — G244 Idiopathic orofacial dystonia: Secondary | ICD-10-CM | POA: Diagnosis not present

## 2017-10-29 DIAGNOSIS — G20 Parkinson's disease: Secondary | ICD-10-CM

## 2017-10-29 MED ORDER — PRAMIPEXOLE DIHYDROCHLORIDE ER 1.5 MG PO TB24: 1.0000 | ORAL_TABLET | Freq: Every day | ORAL | 1 refills | Status: DC

## 2017-11-12 ENCOUNTER — Encounter: Payer: Self-pay | Admitting: Family Medicine

## 2017-11-12 ENCOUNTER — Ambulatory Visit (INDEPENDENT_AMBULATORY_CARE_PROVIDER_SITE_OTHER): Payer: Medicare Other | Admitting: Family Medicine

## 2017-11-12 VITALS — BP 108/64 | HR 86 | Temp 97.7°F | Ht 66.0 in | Wt 177.2 lb

## 2017-11-12 DIAGNOSIS — B9789 Other viral agents as the cause of diseases classified elsewhere: Secondary | ICD-10-CM

## 2017-11-12 DIAGNOSIS — J069 Acute upper respiratory infection, unspecified: Secondary | ICD-10-CM | POA: Diagnosis not present

## 2017-11-12 MED ORDER — BENZONATATE 100 MG PO CAPS: 100.0000 mg | ORAL_CAPSULE | Freq: Three times a day (TID) | ORAL | 0 refills | Status: DC | PRN

## 2017-11-12 NOTE — Progress Notes (Signed)
Chief Complaint  Patient presents with  . Cough    [name] [name] here for URI complaints.  Duration: 1 week  Associated symptoms: sinus congestion, sore throat and dry cough Denies: sinus congestion, sinus pain, rhinorrhea, itchy watery eyes, ear fullness, ear pain, ear drainage, wheezing, shortness of breath, myalgia and fevers Treatment to date: Sudafed, Nyquil Sick contacts: No  ROS:  Const: Denies fevers HEENT: As noted in HPI Lungs: No SOB  Past Medical History:  Diagnosis Date  . Cervical lymphadenopathy    right - followed my ENT  . Dystonia 1994   diagnosed in [place]  . Non Hodgkin's lymphoma ([facility]) 2007   diffuse- 6 cycles of chemo with R CHOP Rituxan - last dose 10/08  . Parkinson disease ([facility])    2015   Family History  Problem Relation Age of Onset  . Heart disease Mother   . Cancer Mother        lung  . Heart disease Father     BP 108/64 (BP Location: Left Arm, Patient Position: Sitting, Cuff Size: Normal)   Pulse 86   Temp 97.7 F (36.5 C) (Oral)   Ht 5\' 6"  (1.676 m)   Wt 177 lb 4 oz (80.4 kg)   SpO2 95%   BMI 28.61 kg/m  General: Awake, alert, appears stated age HEENT: AT, [place], ears patent b/l and TM's neg, nares patent w/o discharge, pharynx pink and without exudates, MMM Neck: No masses or asymmetry Heart: RRR Lungs: CTAB, no accessory muscle use Psych: Age appropriate judgment and insight, normal mood and affect  Viral URI with cough - Plan: benzonatate (TESSALON) 100 MG capsule  Orders as above. Continue to push fluids, practice good hand hygiene, cover mouth when coughing, supportive care. F/u prn. If starting to experience fevers, shaking, or shortness of breath, seek immediate care. Pt voiced understanding and agreement to the plan.  [place], DO 11/12/17 1:28 PM

## 2017-11-12 NOTE — Progress Notes (Signed)
Pre visit review using our clinic review tool, if applicable. No additional management support is needed unless otherwise documented below in the visit note. 

## 2017-11-12 NOTE — Patient Instructions (Signed)
Continue to push fluids, practice good hand hygiene, and cover your mouth if you cough.  If you start having fevers, shaking or shortness of breath, seek immediate care.  For symptoms, consider using Vick's VapoRub on chest or under nose, air humidifier, Benadryl at night, and elevating the head of the bed. Tylenol and ibuprofen for aches and pains you may be experiencing.   Let us know if you need anything.  

## 2017-11-19 ENCOUNTER — Other Ambulatory Visit: Payer: Self-pay | Admitting: Neurology

## 2017-12-31 ENCOUNTER — Ambulatory Visit: Payer: BLUE CROSS/BLUE SHIELD | Admitting: Family Medicine

## 2018-01-07 ENCOUNTER — Ambulatory Visit: Payer: BLUE CROSS/BLUE SHIELD | Admitting: Family Medicine

## 2018-04-12 ENCOUNTER — Ambulatory Visit (INDEPENDENT_AMBULATORY_CARE_PROVIDER_SITE_OTHER): Payer: Medicare Other | Admitting: Family Medicine

## 2018-04-12 ENCOUNTER — Encounter: Payer: Self-pay | Admitting: Family Medicine

## 2018-04-12 VITALS — BP 100/64 | HR 76 | Temp 97.6°F | Ht 66.0 in | Wt 180.5 lb

## 2018-04-12 DIAGNOSIS — H6121 Impacted cerumen, right ear: Secondary | ICD-10-CM | POA: Diagnosis not present

## 2018-04-12 DIAGNOSIS — E785 Hyperlipidemia, unspecified: Secondary | ICD-10-CM

## 2018-04-12 DIAGNOSIS — Z9189 Other specified personal risk factors, not elsewhere classified: Secondary | ICD-10-CM

## 2018-04-12 DIAGNOSIS — R0609 Other forms of dyspnea: Secondary | ICD-10-CM | POA: Diagnosis not present

## 2018-04-12 LAB — LIPID PANEL
CHOLESTEROL: 190 mg/dL (ref 0–200)
HDL: 53.1 mg/dL (ref >39.00)
LDL Cholesterol: 119 mg/dL — ABNORMAL HIGH (ref 0–99)
NonHDL: 137.08
TRIGLYCERIDES: 92 mg/dL (ref 0.0–149.0)
Total CHOL/HDL Ratio: 4
VLDL: 18.4 mg/dL (ref 0.0–40.0)

## 2018-04-12 LAB — COMPREHENSIVE METABOLIC PANEL
ALBUMIN: 4.3 g/dL (ref 3.5–5.2)
ALK PHOS: 57 U/L (ref 39–117)
ALT: 35 U/L (ref 0–53)
AST: 21 U/L (ref 0–37)
BILIRUBIN TOTAL: 0.8 mg/dL (ref 0.2–1.2)
BUN: 16 mg/dL (ref 6–23)
CALCIUM: 10.1 mg/dL (ref 8.4–10.5)
CO2: 29 mEq/L (ref 19–32)
Chloride: 104 mEq/L (ref 96–112)
Creatinine, Ser: 1.09 mg/dL (ref 0.40–1.50)
GFR: 71.76 mL/min (ref >60.00)
Glucose, Bld: 98 mg/dL (ref 70–99)
Potassium: 4.5 mEq/L (ref 3.5–5.1)
SODIUM: 140 meq/L (ref 135–145)
TOTAL PROTEIN: 7 g/dL (ref 6.0–8.3)

## 2018-04-12 LAB — CBC
HCT: 45.4 % (ref 39.0–52.0)
Hemoglobin: 15.5 g/dL (ref 13.0–17.0)
MCHC: 34.2 g/dL (ref 30.0–36.0)
MCV: 98 fl (ref 78.0–100.0)
Platelets: 307 10*3/uL (ref 150.0–400.0)
RBC: 4.64 Mil/uL (ref 4.22–5.81)
RDW: 14.1 % (ref 11.5–15.5)
WBC: 9.1 10*3/uL (ref 4.0–10.5)

## 2018-04-12 LAB — MAGNESIUM: MAGNESIUM: 2.1 mg/dL (ref 1.5–2.5)

## 2018-04-12 MED ORDER — ALBUTEROL SULFATE 108 (90 BASE) MCG/ACT IN AEPB: 1.0000 | INHALATION_SPRAY | Freq: Four times a day (QID) | RESPIRATORY_TRACT | 0 refills | Status: DC | PRN

## 2018-04-12 NOTE — Patient Instructions (Signed)
1-2 days to get results of your labs back.   For inhaler, Ok to use when you are short of breath or around 10-15 minutes prior to going up stairs, etc.  If labs are normal, we will be getting an ultrasound of your heart.  Let us know right away or seek emergent care if any of your symptoms change for the worse.  Let us know if you need anything.

## 2018-04-12 NOTE — Progress Notes (Signed)
Chief Complaint  Patient presents with  . Follow-up    right ear problem  . Shortness of Breath    Subjective: Patient is a 67 y.o. male here for R ear stoppage/wax.  +hx of elevated 10 yr CVD. Largely due to age. He did clean up his diet after this. Tries to stay active. No CP. +DOE over past 2 mo.  DOE for the past couple mo. Getting a little worse. Does not happen at rest, only after he has physically exerted himself, mainly going up stairs. Does not have coughing, fevers, URI s/s's, blood in stool, or any change in appetite. No significant change in weight. +Famhx of exercise induced asthma. No hx of smoking or lung disease.   +hx of ear wax impaction. No use of Q tips, no pain or drainage. R ear feels that it has been getting worse.   ROS: Heart: Denies chest pain  Lungs: Denies current SOB   Past Medical History:  Diagnosis Date  . Cervical lymphadenopathy    right - followed my ENT  . Dystonia 1994   diagnosed in [place]  . Non Hodgkin's lymphoma ([facility]) 2007   diffuse- 6 cycles of chemo with R CHOP Rituxan - last dose 10/08  . Parkinson disease ([facility])    2015    Objective: BP 100/64 (BP Location: Left Arm, Patient Position: Sitting, Cuff Size: Normal)   Pulse 76   Temp 97.6 F (36.4 C) (Oral)   Ht 5\' 6"  (1.676 m)   Wt 180 lb 8 oz (81.9 kg)   SpO2 96%   BMI 29.13 kg/m  General: Awake, appears stated age HEENT: MMM, EOMi; ear is 100% occluded on R, patent after flush, L ear neg Heart: RRR, no bruits, no LE edema Lungs: CTAB, no rales, wheezes or rhonchi. No accessory muscle use Neuro: +resting tremor Psych: Age appropriate judgment and insight, normal affect and mood  Procedure note: ear lavage Verbal consent obtained Combo of warm water and Dulcolax used to irrigate ear by Sharon Seller, CMA. Some wax removed. I personally used a curette with a light source to remove remainder of cerumen. Pt tolerated procedure well overall. No immediate complications  noted.  Assessment and Plan: 10 year risk of MI or stroke 7.5% or greater  Impacted cerumen of right ear  DOE (dyspnea on exertion) - Plan: CBC, Comprehensive metabolic panel, Magnesium  Hyperlipidemia, unspecified hyperlipidemia type - Plan: Lipid panel  Ck labs. Trial SABA. If labs neg, will get Echo.  F/u in 2-3 weeks, if no improvement will ck X-ray and consider referral vs PFT's.  The patient voiced understanding and agreement to the plan.  [place], DO 04/12/18  8:07 AM

## 2018-04-12 NOTE — Progress Notes (Signed)
Pre visit review using our clinic review tool, if applicable. No additional management support is needed unless otherwise documented below in the visit note. 

## 2018-04-13 ENCOUNTER — Ambulatory Visit: Payer: Medicare Other

## 2018-04-13 ENCOUNTER — Ambulatory Visit: Payer: Medicare Other | Admitting: Physical Therapy

## 2018-04-13 ENCOUNTER — Telehealth: Payer: Self-pay | Admitting: Occupational Therapy

## 2018-04-13 ENCOUNTER — Ambulatory Visit: Payer: Medicare Other | Attending: Family Medicine | Admitting: Occupational Therapy

## 2018-04-13 DIAGNOSIS — R1312 Dysphagia, oropharyngeal phase: Secondary | ICD-10-CM

## 2018-04-13 DIAGNOSIS — R471 Dysarthria and anarthria: Secondary | ICD-10-CM | POA: Insufficient documentation

## 2018-04-13 DIAGNOSIS — G20 Parkinson's disease: Secondary | ICD-10-CM

## 2018-04-13 DIAGNOSIS — R2689 Other abnormalities of gait and mobility: Secondary | ICD-10-CM | POA: Insufficient documentation

## 2018-04-13 DIAGNOSIS — R29818 Other symptoms and signs involving the nervous system: Secondary | ICD-10-CM

## 2018-04-13 NOTE — Telephone Encounter (Signed)
Dr. Carles Collet,   Vilinda Blanks was seen for Parkinsons OT, PT, and ST screens 04/13/18.   Pt may benefit from OT referral (due to slowing of ADLs and incr difficulty with ADLs) and speech therapy referral (due to decr voice volume).   If you are in agreement, please send updated order for Speech therapy and Occupational therapy via epic.  Thank you,   Vianne Bulls, OTR/L [facility] [address] [place], [place]  [address] [phone] phone [phone] 04/13/18 3:06 PM

## 2018-04-13 NOTE — Therapy (Signed)
[facility] [address] [place], [place], [address] Phone: [phone]   Fax:  [phone]  Patient Details  Name: [name] [name] MRN: [id number] Date of Birth: [birth year] Referring Provider:  Alonza Bogus, DO  Encounter Date: 04/13/2018  Speech Therapy Parkinson's Disease Screen   Decibel Level today: low to mid 60s dB  (WNL=70-72 dB) with sound level meter 30cm away from pt's mouth. Pt's conversational volume has decreased since last treatment course. Another ST eval and possible course of ST is recommended.   Pt reports his swallowing is "better, actually" and that he is not having difficulty with coughing or choking during meals.  Pt would benefit from speech-language eval for dysarthria - please order via EPIC or call [phone] to schedule   [facility] ,[place], CCC-SLP  04/13/2018, 3:12 PM  [facility] [address] [place], [place], [address] Phone: [phone]   Fax:  [phone]

## 2018-04-13 NOTE — Therapy (Signed)
[facility] [address] [place], [place], [address] Phone: [phone]   Fax:  [phone]  Patient Details  Name: [name] [name] MRN: [id number] Date of Birth: [birth year] Referring Provider:  [name]*  Encounter Date: 04/13/2018   Occupational Therapy Parkinson's Disease Screen  Hand dominance:  right   Fastening/unfastening 3 buttons in:  45.72 sec  9-hole peg test:    [name]  26.37 sec        LUE  28.78 sec  Change in ability to perform ADLs/IADLs:  Yes, decr legibility with writing, difficulty with shaving, difficulty with buttons, difficulty picking up paper, and slowing of ADLs.   Pt would benefit from occupational therapy evaluation due to  Slowing with ADLs, decr legibility with writing, difficulty with shaving.      [place] 04/13/2018, 1:20 PM  [facility] [address] [place], [place], [address] Phone: [phone]   Fax:  [place], OTR/L [facility] [address] [place], [place]  [address] [phone] phone [phone] 04/13/18 1:21 PM

## 2018-04-13 NOTE — Telephone Encounter (Signed)
Order entered

## 2018-04-13 NOTE — Therapy (Addendum)
[facility] [address] [place], [place], [address] Phone: [phone]   Fax:  [phone]  Patient Details  Name: [name] [name] MRN: [id number] Date of Birth: [birth year] Referring Provider:  [name]*  Encounter Date: 04/13/2018   Frazier Butt. 04/13/2018, 1:15 PM  Physical Therapy Parkinson's Disease Screen   Timed Up and Go test: 8.47 sec (compared to 10.43 sec )  10 meter walk test: 8 sec = 4.1 ft/sec (3.72 ft/sec)  5 time sit to stand test: 9.25 sec (9.18 sec)    Patient does not require Physical Therapy services at this time.  Recommend Physical Therapy screen/eval in 6-9 months. Pt feels he is slowing with mobility, handwriting and notices lower voice volume.  His numbers above are better/same as previous therapy d/c.  He denies falls.  He is participating in weekly [employer] exercise classes.  Pt plans to discuss slowed mobility with Dr. Carles Collet at upcoming visit.   Mady Haagensen, PT 04/13/18 1:28 PM Phone: [phone] Fax: [facility] [address] [place], [place], [address] Phone: [phone]   Fax:  [phone]

## 2018-04-19 ENCOUNTER — Encounter: Payer: Self-pay | Admitting: Family Medicine

## 2018-04-19 ENCOUNTER — Ambulatory Visit (INDEPENDENT_AMBULATORY_CARE_PROVIDER_SITE_OTHER): Payer: Medicare Other | Admitting: Family Medicine

## 2018-04-19 VITALS — BP 118/80 | HR 77 | Temp 97.6°F | Ht 66.0 in | Wt 183.4 lb

## 2018-04-19 DIAGNOSIS — S00411A Abrasion of right ear, initial encounter: Secondary | ICD-10-CM

## 2018-04-19 NOTE — Progress Notes (Signed)
Chief Complaint  Patient presents with  . Ear Fullness    Pt is here for right ear fullness. Duration: 1 week Progression: waxing and waning Associated symptoms: drainage Denies: Pain, fevers, URI s/s's Treatment to date: None  ROS:  HEENT: +ear fullness  Past Medical History:  Diagnosis Date  . Cervical lymphadenopathy    right - followed my ENT  . Dystonia 1994   diagnosed in [place]  . Non Hodgkin's lymphoma ([place]) 2007   diffuse- 6 cycles of chemo with R CHOP Rituxan - last dose 10/08  . Parkinson disease ([place])    2015   BP 118/80 (BP Location: Left Arm, Patient Position: Sitting, Cuff Size: Normal)   Pulse 77   Temp 97.6 F (36.4 C) (Oral)   Ht 5\' 6"  (1.676 m)   Wt 183 lb 6 oz (83.2 kg)   SpO2 98%   BMI 29.60 kg/m  General: Awake, alert, appearing stated age HEENT:  L ear- Canal patent without drainage or erythema, TM is neg R ear- canal patent- there appears to be a mixture of dried blood and cerumen on the floor of the canal and I cannot visualize any active bleeding/lesions, TM is neg Lungs: Normal effort, no accessory muscle use Psych: Age appropriate judgment and insight, normal mood and affect  Abrasion of right ear canal, initial encounter  Watchful waiting, likely needs more time to heal. Sounds like drainage is decreasing. F/u prn. Pt voiced understanding and agreement to the plan.  [place], DO 04/19/18 2:11 PM

## 2018-04-19 NOTE — Patient Instructions (Signed)
Do put anything in your ear. I think it is a mixture of blood and wax. There is no occlusion in your ear. Your ear drum looks good.  Let us know if you need anything.

## 2018-04-19 NOTE — Progress Notes (Signed)
Pre visit review using our clinic review tool, if applicable. No additional management support is needed unless otherwise documented below in the visit note. 

## 2018-04-27 NOTE — Progress Notes (Signed)
[name] [name] was seen today in the movement disorders clinic for neurologic consultation at the request of [name], DO.  The consultation is for the evaluation of tremor.  This patient is accompanied in the office by his spouse who supplements the history.    The pt was dx with dystonia in [place] in 1994.  He presented to the speech pathologist and was dx with spasmodic dysphonia.  He was then referred to the neurologist who confirmed the diagnosis and did and MRI of the brain that was normal.  He had botox in to the vocal cords and that was helpful.  He would lose the voice for 6 weeks after the injections and then would get the voice back for 6 weeks.  He would go back to [place] twice per year for the injections.  He hasn't had the injections for the last 2 years as he quit working.  The spasmodic dysphonia is worse on the phone.  Last ST was 1995.  Having some trouble with swallowing.  Had developed some movements in the face and tried botox there but didn't really help there.  Has had some neck extension as well with the dystonic movements.  Wife states that these sx's have been better since retirement; he is under less stress.  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  The patient reports that [name] tremor started in the thumb in may and has slowly gotten worse and 3 weeks ago, he noted just a little in the [name].  He notes the tremor most at rest.  09/26/14 update:  The patient returns today for follow-up.  The patient has a history of Meige's syndrome, but presented last time with parkinsonism, but did not meet all the criteria for idiopathic Parkinson's disease.  Nonetheless, the patient wanted to start on medication.  We started him on Mirapex ER and he has worked his way up to 1.5 mg daily.  The patient reports that he is about 50% better.  He states that it has helped tremor but it isn't gone.  His wife thinks that mirapex is disrupting his sleep.   He has constipation and  thought that it was the mirapex.  His balance has been good.   He had an MRI of the brain since last visit and that was unremarkable.  Because of significant dysphagia from his Meige syndrome he had a modified barium swallow on 08/07/2014 and that revealed mild oral dysphagia, moderate pharyngeal phase dysphagia and a regular diet with thin liquids was recommended.   Has a strange sensation under the bottom of the foot like something is under the ball of the foot.  Has had paresthesias off and on for a long time.  Did get chemo with nonHodgkins and been on B6 since.  01/02/15 update:  The patient is following up today, accompanied by his wife is supplements the history.  The patient is on Mirapex ER, 1.5 mg daily.  Overall, the patient states that he is doing well.  No falls.  No hallucinations.  He continues to have issues with dysphagia, but that is primarily from his long history of Meige syndrome.  He continues to not need much sleep at night but it is not transitioning into daytime sleepiness.    05/03/15 update:  The patient returns today for follow-up, accompanied by his wife who supplements the history.  The patient is on pramipexole ER, 1.5 mg daily. He is doing well on it. He states  that he was exercising but he "fell off the wagon."   He was referred last visit for physical and speech therapy. He states that he never got a call in that regard.   He does have a history of Meige syndrome, which is the primarily etiology for the dysphagia and some of the speech issues.  Last visit, I did ask him to add B12 supplementation, as his B12 was on the low end of normal.  He has been taking his B12 supplement faithfully.  He already he has peripheral neuropathy because of his history of chemotherapy.   Pt states that he is sleeping better than last visit.    09/03/15 update:  The patient follows up today, accompanied by his wife who supplements the history.  He remains on pramipexole ER, 1.5 mg daily.  He has  completed physical therapy and remains in speech therapy.  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  He is doing his PT at home.  Hasn't been faithful with CV exercise.  Has just started melatonin for insomnia and thinks it is helping.   Overall, the patient has been stable.  He has not experienced any falls.  No hallucinations.  No lightheadedness or near syncope.  He has not choked on any of his foods.  His wife is noting some short term memory issues - he forgot to shut garage door and one time forgot to take mirapex.  Pt noted that he had bad RLS when he did that.    01/08/16 update:  The patient follows up today, accompanied by his wife who supplements the history.  He is on Mirapex ER, 1.5 g daily.  He denies compulsive behaviors.  No falls since last visit.  He does have tremor, but not bothersome enough to change his medication.  No hallucinations.  No lightheadedness or near syncope.  He did have a modified barium swallow on 10/18/2015.  There is mild oral and moderate pharyngeal dysphagia.  This was worse with solids than liquids.  The patient felt that this was actually better than his last study.  There is no dietary changes recommended interventions recommended that he eat slowly, take small bites and drink plenty of liquids after his small solid bites.  04/11/16 update:  The patient follows up today, accompanied by his wife who supplements the history.  He is on Mirapex ER, 1.5 g daily.  He denies compulsive behaviors.  No falls since last visit.  He does have tremor, but not bothersome enough to change his medication.  No hallucinations.  No lightheadedness or near syncope.  He did have his therapy screens and while physical therapy was not recommended, occupational and speech therapy were and he will be starting those soon.  Not exercising.    07/17/16 update:  The patient is seen today, accompanied by his wife who supplements the  history.  He is on pramipexole ER, 1.5 mg daily.  He reports that he is doing well.  He continues to have some tremor, but does not want to change his medications.  He denies any falls.  He denies hallucinations.  He denies compulsive behaviors.  He denies lightheadedness or near syncope.  He is now faithfully exercising for 2 months.  His doctor told him that he had to clean up his diet and start exercising and he did this.  He is physically feeling better.  11/21/16 update:  Patient seen today, accompanied by his wife  who supplements the history.  Patient remains on pramipexole ER, 1.5 mg daily.  Patient reports that he has been stable.  Biggest issue is tremor, but that has not changed.  He denies any falls.  Denies compulsive behaviors.  Denies sleep attacks.  Denies lightheadedness or near syncope.  Regarding exercise, he states "I was going great for a few months and then I got off the wagon."  States that his diet has been good and his cholesterol was 25% improved.    04/27/17 update:  Patient seen today in follow-up, accompanied by his wife who supplements the history.  Patient is on pramipexole ER, 1.5 mg daily.  Continues to have tremor, but that has been stable.  Pt denies falls.  Occasionally off balance at the turns.  Pt denies lightheadedness, near syncope.  No hallucinations.  Mood has been good.  In regards to exercise, he states that he is planning to start the [employer] class next week.  His diet has been fairly good.  He is taking his B12 supplement.    10/29/17 update: Patient is seen today in follow-up.  He is accompanied by his wife who supplements the history.  Patient has a history of both Meige syndrome as well as Parkinson's disease.  He is on pramipexole ER, 1.5 mg daily.  Overall, the patient has been stable since our last visit.  The records that were made available to me were reviewed.  Went to PT since our last visit.  He is doing [employer] class and he likes that.  Swallowing has been  "better."  No falls but balance may not be as good.  Tremor has been about the same.  Wife asks about memory.  Worse when patients wife is sick and he has to do all the chores.  Issue is multitasking.  04/28/18 update: Patient is seen today in follow-up for Meige syndrome and Parkinson's disease.  He is accompanied by his wife who supplements the history.  The patient remains on pramipexole ER, 1.5 mg daily.  Forgot med one day and "it was awful."  His legs had more tremor but he just didn't feel good.  Tremor is intermittent.  No falls since our last visit.  Continues to exercise.  Had his neuro rehab evaluations on April 13, 2018 and I reviewed these.  No physical therapy was recommended, but occupational and speech therapy were recommended.  There is so memory change per pts wife but pt doesn't notice.  Some trouble with multitasking per pt.  Meds are put in a pillbox.   He is doing [employer] moves class and it really helps.  Pts wife thinks pt "grumpier" than normal and pt agrees.  Pt also c/o falling asleep easier.  If actively engaged he has no issue.  If relaxed, falls asleep quick.  Noting some SOB and started on albuterol and pt notes no difference and says that dr talked to him about echo.  He has a f/u on Monday.    ALLERGIES:  No Known Allergies  CURRENT MEDICATIONS:  Outpatient Encounter Medications as of 04/29/2018  Medication Sig  . Albuterol Sulfate 108 (90 Base) MCG/ACT AEPB Inhale 1-2 puffs into the lungs every 6 (six) hours as needed.  Marland Kitchen aspirin 81 MG chewable tablet Chew 81 mg by mouth daily.  . Cholecalciferol (VITAMIN D3) 3000 UNITS TABS Take 1,000 Units by mouth.   . cyanocobalamin 100 MCG tablet Take 1,000 mcg by mouth daily.   . Melatonin 3 MG TABS  Take by mouth at bedtime.  . Pramipexole Dihydrochloride 1.5 MG TB24 Take 1 tablet (1.5 mg total) by mouth daily.  . [DISCONTINUED] Pramipexole Dihydrochloride 1.5 MG TB24 TAKE 1 TABLET DAILY   No facility-administered encounter medications  on file as of 04/29/2018.     PAST MEDICAL HISTORY:   Past Medical History:  Diagnosis Date  . Cervical lymphadenopathy    right - followed my ENT  . Dystonia 1994   diagnosed in [place]  . Non Hodgkin's lymphoma ([place]) 2007   diffuse- 6 cycles of chemo with R CHOP Rituxan - last dose 10/08  . Parkinson disease ([place])    2015    PAST SURGICAL HISTORY:   Past Surgical History:  Procedure Laterality Date  . CHOLECYSTECTOMY, LAPAROSCOPIC    . EYE SURGERY     x2 as child  . PORT-A-CATH REMOVAL    . PORTACATH PLACEMENT    . SPLENECTOMY    . TONSILLECTOMY      SOCIAL HISTORY:   Social History   Socioeconomic History  . Marital status: Married    Spouse name: Not on file  . Number of children: Not on file  . Years of education: Not on file  . Highest education level: Not on file  Occupational History  . Not on file  Social Needs  . Financial resource strain: Not on file  . Food insecurity:    Worry: Not on file    Inability: Not on file  . Transportation needs:    Medical: Not on file    Non-medical: Not on file  Tobacco Use  . Smoking status: Never Smoker  . Smokeless tobacco: Never Used  Substance and Sexual Activity  . Alcohol use: No    Alcohol/week: 0.0 standard drinks  . Drug use: No  . Sexual activity: Not on file  Lifestyle  . Physical activity:    Days per week: Not on file    Minutes per session: Not on file  . Stress: Not on file  Relationships  . Social connections:    Talks on phone: Not on file    Gets together: Not on file    Attends religious service: Not on file    Active member of club or organization: Not on file    Attends meetings of clubs or organizations: Not on file    Relationship status: Not on file  . Intimate partner violence:    Fear of current or ex partner: Not on file    Emotionally abused: Not on file    Physically abused: Not on file    Forced sexual activity: Not on file  Other Topics Concern  . Not on file  Social  History Narrative  . Not on file    FAMILY HISTORY:   Family Status  Relation Name Status  . Mother  Deceased       heart disease, lung cancer  . Father  Deceased       heart attack  . Brother half Deceased       colon cancer  . Sister half Alive       unknown  . Son  Alive       anemia  . Son  Alive       healthy  . Daughter  Alive       healthy  . Son  Deceased       debrancher's enzyme disease  . Son  Deceased       debrancher's enzyme disease  ROS:  A complete 10 system review of systems was obtained and was unremarkable apart from what is mentioned above.  PHYSICAL EXAMINATION:    VITALS:   Vitals:   04/29/18 0808  BP: 104/62  Pulse: 62  SpO2: 99%  Weight: 183 lb (83 kg)  Height: 5\' 6"  (1.676 m)   Wt Readings from Last 3 Encounters:  04/29/18 183 lb (83 kg)  04/19/18 183 lb 6 oz (83.2 kg)  04/12/18 180 lb 8 oz (81.9 kg)   GEN:  The patient appears stated age and is in NAD. HEENT:  Normocephalic, atraumatic.  The mucous membranes are moist. The superficial temporal arteries are without ropiness or tenderness. CV:  RRR Lungs:  CTAB Neck/HEME:  There are no carotid bruits bilaterally.  Neurological examination:  Orientation: The patient is alert and oriented x3. Cranial nerves: There is good facial symmetry.  Mild oromandibular dystonia.  There is left exotropia that is chronic.  The speech is fluent and bulbar in quality with spasmodic dysphonia. Soft palate rises symmetrically and there is no tongue deviation. Hearing is intact to conversational tone. Sensation: Sensation is intact to light touch throughout Motor: Strength is 5/5 in the bilateral upper and lower extremities.   Shoulder shrug is equal and symmetric.  There is no pronator drift.   Movement examination: Tone: There is normal tone in the bilateral upper extremities.  The tone in the lower extremities is normal.  Abnormal movements: There is very little tremor today, but when he has it is  mostly in the right arm, although occasionally in the left. Coordination:  There is mild decremation with finger taps bilaterally and toe taps on the right.  All other rapid alternating movements were normal.   Gait and Station: The patient has no difficulty arising out of a deep-seated chair without the use of the hands.  Slight decrease in arm swing on the left.  The patient's stride length is normal.  The patient has a negative pull test.     Lab Results  Component Value Date   VITAMINB12 292 09/26/2014   .   Chemistry      Component Value Date/Time   NA 140 04/12/2018 0807   K 4.5 04/12/2018 0807   CL 104 04/12/2018 0807   CO2 29 04/12/2018 0807   BUN 16 04/12/2018 0807   CREATININE 1.09 04/12/2018 0807   CREATININE 0.94 09/26/2014 0925      Component Value Date/Time   CALCIUM 10.1 04/12/2018 0807   ALKPHOS 57 04/12/2018 0807   AST 21 04/12/2018 0807   ALT 35 04/12/2018 0807   BILITOT 0.8 04/12/2018 0807     Lab Results  Component Value Date   WBC 9.1 04/12/2018   HGB 15.5 04/12/2018   HCT 45.4 04/12/2018   MCV 98.0 04/12/2018   PLT 307.0 04/12/2018       ASSESSMENT/PLAN:  1.  Mild PD  -Even though he has prominent facial dystonia (meige syndrome), I don't think that this is related as this started in 1994.  He has found Mirapex ER 1.5 mg to be helpful.  Long talk with the patient today about the fact that the sleepiness, perceived memory change and late at night and games, could be side effects of pramipexole.  Talked to him about changing to levodopa instead.  He really does not want to do this.  He does not drive when he is sleepy, and reports that he really does not sleep if he is engaged.  He  was advised to set a regular schedule for when he can play video games and then to put them down at night.    -talked about PARTS program  -wife concerned about memory change.  Pt refused neurocognitive testing.  Will think about that.   I saw no evidence of dementia 2.   Dysphagia  -primarily related to Meige syndrome.  -He did have a modified barium swallow on 10/18/2015.  There is mild oral and moderate pharyngeal dysphagia.  This was worse with solids than liquids.   He thinks that this has improved since then 3.  Paresthesias.  -Probable due to peripheral neuropathy, which is likely due to his history of chemotherapy from non-Hodgkin's lymphoma.    -His B12 has been on the low end, but he has been faithfully taking a supplement.   4.  Follow up is anticipated in the next few months, sooner should new neurologic issues arise.  Much greater than 50% of this visit was spent in counseling and coordinating care.  Total face to face time:  30 min

## 2018-04-29 ENCOUNTER — Ambulatory Visit (INDEPENDENT_AMBULATORY_CARE_PROVIDER_SITE_OTHER): Payer: Medicare Other | Admitting: Neurology

## 2018-04-29 ENCOUNTER — Encounter: Payer: Self-pay | Admitting: Neurology

## 2018-04-29 VITALS — BP 104/62 | HR 62 | Ht 66.0 in | Wt 183.0 lb

## 2018-04-29 DIAGNOSIS — G20 Parkinson's disease: Secondary | ICD-10-CM

## 2018-04-29 DIAGNOSIS — G244 Idiopathic orofacial dystonia: Secondary | ICD-10-CM

## 2018-04-29 DIAGNOSIS — R413 Other amnesia: Secondary | ICD-10-CM | POA: Diagnosis not present

## 2018-04-29 NOTE — Patient Instructions (Signed)
Parkinson's Caregiver Group   Parkinson's disease can be challenging for caregivers too. Changing  abilities and assuming new roles within the family can cause emotional  upheaval. Meeting with a group of peers who are experiencing similar changes is very beneficial for any caregiver. This professionally led support group will provide a safe place for Parkinson's caregivers to connect, share challenges, seek solutions, learn about resources and strengthen coping skills.    When:  Last Monday of the month (unless date falls on a holiday)  2019 Dates: 1/28, 2/25, 3/25, 4/29, 5/20, 6/24, 7/29, 8/26, 9/30, 10/28, 11/25, 12/30   Location: [facility]                                                                                              [place], [place]                                                                                      Room 204 Time: 2-3:30   Please call Myra Gianotti, MSW, LCSW at [phone] with any questions.

## 2018-05-03 ENCOUNTER — Ambulatory Visit (HOSPITAL_BASED_OUTPATIENT_CLINIC_OR_DEPARTMENT_OTHER)
Admission: RE | Admit: 2018-05-03 | Discharge: 2018-05-03 | Disposition: A | Discharge status: Home / Self Care | Payer: Medicare Other | Source: Ambulatory Visit | Attending: Family Medicine | Admitting: Family Medicine

## 2018-05-03 ENCOUNTER — Encounter: Payer: Self-pay | Admitting: Family Medicine

## 2018-05-03 ENCOUNTER — Ambulatory Visit (INDEPENDENT_AMBULATORY_CARE_PROVIDER_SITE_OTHER): Payer: Medicare Other | Admitting: Family Medicine

## 2018-05-03 VITALS — BP 108/82 | HR 72 | Temp 97.8°F | Ht 66.0 in | Wt 185.2 lb

## 2018-05-03 DIAGNOSIS — R0609 Other forms of dyspnea: Secondary | ICD-10-CM | POA: Insufficient documentation

## 2018-05-03 DIAGNOSIS — R0602 Shortness of breath: Secondary | ICD-10-CM | POA: Diagnosis not present

## 2018-05-03 NOTE — Patient Instructions (Signed)
We will be in touch regarding the results of your X-ray.   Please come in sooner if things aren't turning the corner.  Aim to do some physical exertion for 150 minutes per week. This is typically divided into 5 days per week, 30 minutes per day. The activity should be enough to get your heart rate up. Anything is better than nothing if you have time constraints.  Let us know if you need anything.

## 2018-05-03 NOTE — Progress Notes (Signed)
Chief Complaint  Patient presents with  . Breathing Problem    no better    Subjective: Patient is a 67 y.o. male here for DOE f/u.  Trialed on SABA without relief. Still gets 2-3 min of sob after going up stairs. This has not worsened since his last visit. He is not swelling and denies cough/wheezing. Again, only happens with physical exertion. Denies chest pain, n/v, jaw pain or arm pain.   ROS: Heart: Denies chest pain  Lungs: Denies current SOB    Past Medical History:  Diagnosis Date  . Cervical lymphadenopathy    right - followed my ENT  . Dystonia 1994   diagnosed in [place]  . Non Hodgkin's lymphoma ([place]) 2007   diffuse- 6 cycles of chemo with R CHOP Rituxan - last dose 10/08  . Parkinson disease ([place])    2015    Objective: BP 108/82 (BP Location: Left Arm, Patient Position: Sitting, Cuff Size: Normal)   Pulse 72   Temp 97.8 F (36.6 C) (Oral)   Ht 5\' 6"  (1.676 m)   Wt 185 lb 4 oz (84 kg)   SpO2 96%   BMI 29.90 kg/m  General: Awake, appears stated age HEENT: MMM, EOMi Heart: RRR, no murmurs, no bruits, no LE edema Lungs: CTAB, no rales, wheezes or rhonchi. No accessory muscle use Psych: Age appropriate judgment and insight, normal affect and mood  Assessment and Plan: DOE (dyspnea on exertion) - Plan: DG Chest 2 View  Orders as above. Ck XR, discussed increasing physical activity to see if things improve. Stop SABA.  F/u in 2 mo to see how he is doing. If no better, will refer to pulm.  The patient voiced understanding and agreement to the plan.  [place], DO 05/03/18  9:08 AM

## 2018-05-03 NOTE — Progress Notes (Signed)
Pre visit review using our clinic review tool, if applicable. No additional management support is needed unless otherwise documented below in the visit note. 

## 2018-05-04 ENCOUNTER — Encounter: Payer: Self-pay | Admitting: Occupational Therapy

## 2018-05-04 ENCOUNTER — Ambulatory Visit: Payer: Medicare Other | Attending: Neurology | Admitting: Occupational Therapy

## 2018-05-04 ENCOUNTER — Ambulatory Visit: Payer: Medicare Other | Admitting: Speech Pathology

## 2018-05-04 ENCOUNTER — Other Ambulatory Visit: Payer: Self-pay

## 2018-05-04 DIAGNOSIS — R2681 Unsteadiness on feet: Secondary | ICD-10-CM | POA: Diagnosis not present

## 2018-05-04 DIAGNOSIS — R471 Dysarthria and anarthria: Secondary | ICD-10-CM | POA: Diagnosis not present

## 2018-05-04 DIAGNOSIS — R41840 Attention and concentration deficit: Secondary | ICD-10-CM | POA: Insufficient documentation

## 2018-05-04 DIAGNOSIS — R41841 Cognitive communication deficit: Secondary | ICD-10-CM | POA: Insufficient documentation

## 2018-05-04 DIAGNOSIS — R251 Tremor, unspecified: Secondary | ICD-10-CM | POA: Diagnosis not present

## 2018-05-04 DIAGNOSIS — R293 Abnormal posture: Secondary | ICD-10-CM | POA: Diagnosis not present

## 2018-05-04 DIAGNOSIS — R278 Other lack of coordination: Secondary | ICD-10-CM | POA: Insufficient documentation

## 2018-05-04 DIAGNOSIS — R29818 Other symptoms and signs involving the nervous system: Secondary | ICD-10-CM | POA: Diagnosis not present

## 2018-05-04 NOTE — Therapy (Signed)
[facility] [address] [place], [place], [address] Phone: [phone]   Fax:  [phone]  Speech Language Pathology Evaluation  Patient Details  Name: [name] [name] MRN: [id number] Date of Birth: 07-09-1951 Referring Provider: Dr. Carles Collet   Encounter Date: 05/04/2018  End of Session - 05/04/18 1239    Visit Number  1    Number of Visits  17    Date for SLP Re-Evaluation  07/09/18    Authorization Type  30 visits ST, 0 used    SLP Start Time  0845    SLP Stop Time   0927    SLP Time Calculation (min)  42 min    Activity Tolerance  Patient tolerated treatment well       Past Medical History:  Diagnosis Date  . Cervical lymphadenopathy    right - followed my ENT  . Dystonia 1994   diagnosed in [place]  . Non Hodgkin's lymphoma ([place]) 2007   diffuse- 6 cycles of chemo with R CHOP Rituxan - last dose 10/08  . Parkinson disease ([place])    2015    Past Surgical History:  Procedure Laterality Date  . CHOLECYSTECTOMY, LAPAROSCOPIC    . EYE SURGERY     x2 as child  . PORT-A-CATH REMOVAL    . PORTACATH PLACEMENT    . SPLENECTOMY    . TONSILLECTOMY      There were no vitals filed for this visit.  Subjective Assessment - 05/04/18 0851    Subjective  "Oh I'm kinda soft."    Currently in Pain?  No/denies         SLP Evaluation [facility] - 05/04/18 7510      SLP Visit Information   SLP Received On  05/04/18    Referring Provider  Dr. Carles Collet    Onset Date  PD dx 2015    Medical Diagnosis  Parkinson's Disease      Subjective   Patient/Family Stated Goal  not have to repeat myself      General Information   HPI  [name] [name] is a 67 y.o. male known to use from prior course of ST due to Parkinson's induced dysarthria. He also has a history of Meige's syndrome, spasmodic dysphonia (1995) for which he received botox injections.    Behavioral/Cognition  alert, cooperative    Mobility Status  ambulated to session       Balance Screen   Has the patient fallen in the past 6 months  No    Has the patient had a decrease in activity level because of a fear of falling?   Yes    Is the patient reluctant to leave their home because of a fear of falling?   No      Prior Functional Status   Cognitive/Linguistic Baseline  Within functional limits    Type of Home  House     Lives With  Spouse    Available Support  Family    Education  BA    Vocation  Retired      Associate Professor   Overall Cognitive Status  Impaired/Different from baseline   pt reports decreased multitasking ability     Auditory Comprehension   Overall Auditory Comprehension  Appears within functional limits for tasks assessed      Visual Recognition/Discrimination   Discrimination  Within Function Limits      Reading Comprehension   Reading Status  Within funtional limits      Expression  Primary Mode of Expression  Verbal      Verbal Expression   Overall Verbal Expression  Appears within functional limits for tasks assessed      Written Expression   Dominant Hand  Right    Written Expression  Not tested      Oral Motor/Sensory Function   Overall Oral Motor/Sensory Function  Impaired    Labial ROM  Reduced right;Reduced left    Labial Symmetry  Within Functional Limits    Labial Strength  Within Functional Limits    Lingual ROM  Within Functional Limits    Lingual Symmetry  Within Functional Limits    Lingual Strength  Within Functional Limits    Facial ROM  Within Functional Limits    Facial Symmetry  Within Functional Limits    Facial Strength  Within Functional Limits    Velum  Within Functional Limits    Mandible  Within Functional Limits      Motor Speech   Overall Motor Speech  Impaired    Respiration  Impaired    Level of Impairment  Phrase    Phonation  Low vocal intensity;Hoarse    Resonance  Within functional limits   ?slight hypernasality   Articulation  Impaired    Level of Impairment  Conversation     Intelligibility  Intelligible    Motor Planning  Witnin functional limits    Phonation  Impaired    Volume  Soft      Standardized Assessments   Standardized Assessments   Other Assessment    Other Assessment  Oral motor assessment revealed WNL lingual ROM and WNL lingual strength. Labial ROM was WNL and strength was WNL. Velar ROM appeared WNL, however resonance slightly hypernasal. Measured when a sound level meter was placed 30 cm away from pt's mouth, 5 minutes of conversational speech was reduced today, at average 64 dB (WNL= average 70-72dB) with range of 60-68 dB. Overall speech intelligibility for this listener in a quiet environment was not affected, at approx 100%. Production of loud /a/ averaged 95dB (range of 90 to 98) and rare min cues needed for loudness. Pt then rated effort level at 8/10 for production of loud /a/ (10=maximal effort). In phrase level task pt was asked to use the same amount of effort as with loud /a/. Loudness average with this increased effort was 73dB (range of 69 to 77) with occasional min A for loudness.                       SLP Education - 05/04/18 1238    Education Details  Loud /a/, HEP    Person(s) Educated  Patient    Methods  Handout;Demonstration;Verbal cues    Comprehension  Verbalized understanding;Returned demonstration;Need further instruction       SLP Short Term Goals - 05/04/18 1242      SLP SHORT TERM GOAL #1   Title  Pt will achieve 90dB average on loud /a/ over 3 sessions    Baseline            Time  4    Period  Weeks    Status  New      SLP SHORT TERM GOAL #2   Title  Pt will mainitain average of 70dB during structured speech tasks and sentence level utteraces with occasional minc cues over two sessions    Time  4    Period  Weeks    Status  New  SLP SHORT TERM GOAL #3   Title  In 7 minutes simple conversation pt will achieve average speech volume of low 70s dB over two sessions     Time  4    Period   Weeks    Status  New       SLP Long Term Goals - 05/04/18 1243      SLP LONG TERM GOAL #1   Title  Pt will maintain average of 70dB over 12 minute simple conversation with rare min A over three sessions    Baseline                              Time  8    Period  Weeks    Status  New      SLP LONG TERM GOAL #2   Title  Pt will be audible over 15 minute conversation in noisy environment/outside of therapy room with min rare A x 2 sessions    Baseline                          Time  8    Period  Weeks    Status  New      SLP LONG TERM GOAL #3   Title  Pt will report less frequent requests to repeat himself outside of [place] room over 2 visits.    Time  8    Period  Weeks    Status  New       Plan - 05/04/18 1240    Clinical Impression Statement  Mr. Stief presents today with sub WNL vocal intensity due to hypokinetic dysarthria. He reports frequent requests to repeat himself, particularly in noisy environments. He denies dysphagia. He also reports mild cognitive deficits, stating he is more easily distracted: "I used to be a good multitasker." Pt was highly stimulable for loud /a/ and subsequently louder speech in phrase level tasks. I recommend skilled ST in order to improve speech intelligibility and pt's QOL. Will monitor cognition, assess and add goals as needed.    Speech Therapy Frequency  2x / week    Duration  --   8 weeks or 16 visits   Treatment/Interventions  Cognitive reorganization;Cueing hierarchy;Compensatory techniques;Internal/external aids;Functional tasks;SLP instruction and feedback;Patient/family education;Other (comment)   intensity-based intervention for hypokinetic dysarthria   Potential to Achieve Goals  Good    SLP Home Exercise Plan  loud /a/, everyday phrases provided    Consulted and Agree with Plan of Care  Patient       Patient will benefit from skilled therapeutic intervention in order to improve the following deficits and impairments:   Dysarthria  and anarthria  Cognitive communication deficit    Problem List Patient Active Problem List   Diagnosis Date Noted  . 10 year risk of MI or stroke 7.5% or greater 04/12/2018  . 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 Dysphagia, neurologic 07/27/2014  . Parkinson disease ([place]) 07/27/2014  . Dystonia 07/10/2014  . Low back pain 11/21/2011  . TRANSAMINASES, SERUM, ELEVATED 07/06/2008  . DIARRHEA 05/23/2008  . LYMPH NODE-ENLARGED 07/28/2007  . Non-Hodgkin lymphoma ([place]) 03/23/2007   Deneise Lever, [place], [place] 05/04/2018, 12:59 PM  [facility] [address] [place], [place], [address] Phone: [phone]   Fax:  [phone]  Name: [name]  BRACY [name] MRN: [id number] Date of Birth: [birth year]

## 2018-05-04 NOTE — Patient Instructions (Addendum)
Home exercises 3x per day  1) Say "ah" with your loud, good quality voice (use your abs). Aim for effort level 8.   5 times   2) Read your list of phrases in a LOUD voice. Effort level 8.  I love you.   What's for dinner?  It's all good.  I'm off!  See you later!  Good morning.   Hello!

## 2018-05-04 NOTE — Therapy (Signed)
[facility] [address] [place], [place], [address] Phone: [phone]   Fax:  [phone]  Occupational Therapy Evaluation  Patient Details  Name: [name] [name] MRN: [id number] Date of Birth: Sep 23, 1950 Referring Provider: Dr. Carles Collet   Encounter Date: 05/04/2018  OT End of Session - 05/04/18 1114    Visit Number  1    Number of Visits  17    Date for OT Re-Evaluation  08/02/18    Authorization Type  [employer]     Authorization Time Period  POC 8 weeks, auth period 90 days    Authorization - Visit Number  1    Authorization - Number of Visits  10    OT Start Time  0933    OT Stop Time  1013    OT Time Calculation (min)  40 min    Activity Tolerance  Patient tolerated treatment well    Behavior During Therapy  [facility] for tasks assessed/performed       Past Medical History:  Diagnosis Date  . Cervical lymphadenopathy    right - followed my ENT  . Dystonia 1994   diagnosed in [place]  . Non Hodgkin's lymphoma ([place]) 2007   diffuse- 6 cycles of chemo with R CHOP Rituxan - last dose 10/08  . Parkinson disease ([place])    2015    Past Surgical History:  Procedure Laterality Date  . CHOLECYSTECTOMY, LAPAROSCOPIC    . EYE SURGERY     x2 as child  . PORT-A-CATH REMOVAL    . PORTACATH PLACEMENT    . SPLENECTOMY    . TONSILLECTOMY      There were no vitals filed for this visit.  Subjective Assessment - 05/04/18 0935    Subjective   Denies pian, reports overall slowing of ADLS    Patient Stated Goals  improve handwriting, improve ease with shaving    Currently in Pain?  No/denies        [facility] OT Assessment - 05/04/18 0936      Assessment   Medical Diagnosis  Parkinson's disease    Referring Provider  Dr. Carles Collet    Onset Date/Surgical Date  05/04/18    Prior Therapy  OT      Precautions   Precautions  Fall      Balance Screen   Has the patient fallen in the past 6 months  No    Has the patient had a  decrease in activity level because of a fear of falling?   No    Is the patient reluctant to leave their home because of a fear of falling?   No      Home  Environment   Family/patient expects to be discharged to:  Private residence    Type of [place] entrance   has chair lift from level 1-2   Bathroom Astronomer    Lives With  Spouse      Prior Function   Level of Independence  Independent    Vocation  Retired    Leisure  [employer]! exercise,  video games reading, restoring antique electronics      ADL   Eating/Feeding  Modified independent    Grooming  Modified independent   increased difficulty with shaving and trimming mustache   Upper Body Bathing  Modified independent   increased time   Lower Body Bathing  Modified independent    Lower Body Bathing  Patient Percentage  --   increased time   Upper Body Dressing  Independent   difficulty with buttons   Lower Body Dressing  Modified independent    Toilet Transfer  Modified independent    Tub/Shower Transfer  Modified independent    ADL comments  Pt reports difficutly with the following activities: shaving, handwriting, fastening buttons, picking up paper and overall slowing of ADLs.      IADL   Shopping  Takes care of all shopping needs independently    Light Housekeeping  Performs light daily tasks such as dishwashing, bed making    Meal Prep  Able to complete simple cold meal and snack prep    Community Mobility  Drives own vehicle    Medication Management  Is responsible for taking medication in correct dosages at correct time    Psychiatrist financial matters independently (budgets, writes checks, pays rent, bills goes to bank), collects and keeps track of income      Mobility   Mobility Status  Independent      Written Expression   Dominant Hand  Right    Handwriting  50% legible   moderate micrographia      Vision - History   Visual History  --    legally  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     Vision Assessment   Vision Assessment  Vision not  tested      Cognition   Memory  Impaired    Memory Impairment  Decreased short term memory    Cognition Comments  Pt reports increased difficulty with multi-tasking      Observation/Other Assessments   Physical Performance Test    Yes    Simulated Eating Comments  18.69 secs    Donning Doffing Jacket Time (seconds)  9.07 secs    Donning Doffing Jacket Comments  3 button/ unbutton 42.79 secs      Coordination   Gross Motor Movements are Fluid and Coordinated  No    Fine Motor Movements are Fluid and Coordinated  No    9 Hole Peg Test  Right;Left    Right 9 Hole Peg Test  26.37 secs    04/13/18 screen   Left 9 Hole Peg Test  28.78 secs   04/13/18 screen   Box and Blocks  [name] 50 blocks, LUE 47 blocks    Tremors  bilateral [name] greater than LUE      ROM / Strength   AROM / PROM / Strength  AROM      AROM   Overall AROM   Deficits    Overall AROM Comments  [name] shoulder flexion 140, LUE shoulder flexion 110, shoulder abduction [name] 135, LUE 110, mildly decreased bilateral supination.                        OT Short Term Goals - 05/04/18 1141      OT SHORT TERM GOAL #1   Title  Pt will be independent with updated PD specific HEP--check STG's 06/03/18    Time  4    Period  Weeks    Status  New      OT SHORT TERM GOAL #2   Title  Pt will be able to write at least 2 sentences with at least 75% legibility.    Baseline  25-50% legibility    Time  4    Period  Weeks    Status  New  OT SHORT TERM GOAL #3   Title  Pt will demonstrate increased LUE functional use as evidenced by increasing box/ blocks score by 4 blocks.    Baseline  [name] 50, LUE 47      OT SHORT TERM GOAL #4   Title  Pt will verbalize understanding of ways to prevent future complications and appropriate community resources.    Time  4    Period  Weeks    Status  New        OT Long Term Goals -  05/04/18 1143      OT LONG TERM GOAL #1   Title  Pt will verbalize understanding of large amplitude movement strategies to incr ease/efficiency with ADLs/IADLs.--check LTGs 07/02/18    Time  8    Period  Weeks    Status  New      OT LONG TERM GOAL #2   Title  Pt will improve ability to fasten buttons as evidenced by improving 3 button/ unbutton time to 38 secs or less    Baseline  42.79 secs    Time  8    Period  Weeks    Status  New      OT LONG TERM GOAL #3   Title  Pt will be able to write at least 3 sentences with at least 90% legibility.    Baseline  25%-50%    Time  8    Period  Weeks    Status  New      OT LONG TERM GOAL #4   Title  Pt will demonstrate ability to retrieve lightweight object at 120 shoulder flexion with LUE.    Baseline  110 shoulder flexion    Time  8    Period  Weeks    Status  New            Plan - 05/04/18 1117    Clinical Impression Statement  Pt is a 67 y.o with Parkinson's disease who returns to occupational therapy with a decline in ADL performance. Pt presents with the following deficits: decreased coordination, tremor, decreased ROM, mild cognitive deficits, abnormal posture, and bradykinesia which impede perfromance of ADLs/IADLS. Pt can benefit from skilled occupational therapy to address these deficits in order to maximize safety and I with ADLs/ IADLs.    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 Pt lives with his wife who has MS. PMH: Non Hodgkin's lymphoma, L eye legallly blind, Meige syndrome, low back pain    Occupational performance deficits (Please refer to evaluation for details):  ADL's;IADL's;Leisure;Social Participation;Rest and Sleep    Rehab Potential  Good    OT Frequency  2x / week   plus eval   OT Duration  8 weeks   Anticipate wrapping up after 4-6 weeks dependent  on progress   OT Treatment/Interventions  Self-care/ADL training;Therapeutic exercise;Visual/perceptual remediation/compensation;Patient/family education;Moist Heat;Paraffin;Neuromuscular education;Fluidtherapy;Therapeutic activities;Balance training;Passive range of motion;Manual Therapy;DME and/or AE instruction;Ultrasound;Contrast Bath;Cognitive remediation/compensation;Cryotherapy;Aquatic Therapy    Plan  initiate coordination HEP, [employer]WR! hands, handwriting strategies    Clinical Decision Making  Limited treatment options, no task modification necessary    Consulted and Agree with Plan of Care  Patient       Patient will benefit from skilled therapeutic intervention in order to improve the following deficits and impairments:  Abnormal gait, Decreased cognition, Decreased knowledge of use of DME,  Impaired flexibility, Impaired vision/preception, Decreased coordination, Decreased mobility, Decreased strength, Decreased range of motion, Decreased endurance, Decreased balance, Decreased knowledge of precautions, Decreased safety awareness, Difficulty walking, Impaired perceived functional ability, Impaired UE functional use  Visit Diagnosis: Other symptoms and signs involving the nervous system  Abnormal posture  Other lack of coordination  Attention and concentration deficit  Unsteadiness on feet  Tremor, unspecified    Problem List Patient Active Problem List   Diagnosis Date Noted  . 10 year risk of MI or stroke 7.5% or greater 04/12/2018  . 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 Dysphagia, neurologic 07/27/2014  . Parkinson disease ([place]) 07/27/2014  . Dystonia 07/10/2014  . Low back pain 11/21/2011  . TRANSAMINASES, SERUM, ELEVATED 07/06/2008  . DIARRHEA 05/23/2008  . LYMPH NODE-ENLARGED 07/28/2007  . Non-Hodgkin lymphoma ([place]) 03/23/2007    Prisha Hiley 05/04/2018, 12:29 PM Theone Murdoch, OTR/L [phone]  [phone] Phone: [phone] 12:31 PM 05/04/18 [facility] [address] [place], [place], [address] Phone: [phone]   Fax:  [phone]  Name: [name] [name] MRN: [id number] Date of Birth: Feb 02, 1951

## 2018-05-17 ENCOUNTER — Ambulatory Visit: Payer: Medicare Other | Admitting: Occupational Therapy

## 2018-05-17 DIAGNOSIS — R251 Tremor, unspecified: Secondary | ICD-10-CM

## 2018-05-17 DIAGNOSIS — R41840 Attention and concentration deficit: Secondary | ICD-10-CM | POA: Diagnosis not present

## 2018-05-17 DIAGNOSIS — R278 Other lack of coordination: Secondary | ICD-10-CM

## 2018-05-17 DIAGNOSIS — R2681 Unsteadiness on feet: Secondary | ICD-10-CM | POA: Diagnosis not present

## 2018-05-17 DIAGNOSIS — R29818 Other symptoms and signs involving the nervous system: Secondary | ICD-10-CM | POA: Diagnosis not present

## 2018-05-17 DIAGNOSIS — R293 Abnormal posture: Secondary | ICD-10-CM | POA: Diagnosis not present

## 2018-05-17 NOTE — Patient Instructions (Signed)
Coordination Exercises  Perform the following exercises for 20 minutes 1 times per day. Perform with both hand(s). Perform using big movements.  Flipping Cards: Place deck of cards on the table. Flip cards over by opening your hand big to grasp and then turn your palm up big. Deal cards: Hold 1/2 or whole deck in your hand. Use thumb to push card off top of deck with one big push. Rotate ball with fingertips: Pick up with fingers/thumb and move as much as you can with each turn/movement (clockwise and counter-clockwise). Toss ball from one hand to the other: Toss big/high. Pick up coins and place in coin bank or container: Pick up with big, intentional movements. Do not drag coin to the edge. Pick up coins and stack one at a time: Pick up with big, intentional movements. Do not drag coin to the edge. (5-10 in a stack) Pick up 5-10 coins one at a time and hold in palm. Then, move coins from palm to fingertips one at time and place in coin bank/container. Practice writing: Slow down, write big, and focus on forming each letter. Perform "Flicks"/hand stretches (PWR! Hands): Close hands then flick out your fingers with focus on opening hands, pulling wrists back, and extending elbows like you are pushing. 

## 2018-05-17 NOTE — Therapy (Signed)
[facility] [address] [place], [place], [address] Phone: [phone]   Fax:  [phone]  Occupational Therapy Treatment  Patient Details  Name: [name] [name] MRN: [id number] Date of Birth: August 26, 1951 Referring Provider: Dr. Carles Collet   Encounter Date: 05/17/2018  OT End of Session - 05/17/18 0904    Visit Number  2    Number of Visits  17    Date for OT Re-Evaluation  08/02/18    Authorization Type  BCBS Medicare     Authorization Time Period  POC 8 weeks, auth period 90 days    Authorization - Visit Number  2    Authorization - Number of Visits  10    OT Start Time  [phone]    OT Stop Time  0930    OT Time Calculation (min)  39 min    Activity Tolerance  Patient tolerated treatment well    Behavior During Therapy  [facility] for tasks assessed/performed       Past Medical History:  Diagnosis Date  . Cervical lymphadenopathy    right - followed my ENT  . Dystonia 1994   diagnosed in [place]  . Non Hodgkin's lymphoma ([place]) 2007   diffuse- 6 cycles of chemo with R CHOP Rituxan - last dose 10/08  . Parkinson disease ([employer])    2015    Past Surgical History:  Procedure Laterality Date  . CHOLECYSTECTOMY, LAPAROSCOPIC    . EYE SURGERY     x2 as child  . PORT-A-CATH REMOVAL    . PORTACATH PLACEMENT    . SPLENECTOMY    . TONSILLECTOMY      There were no vitals filed for this visit.  Subjective Assessment - 05/17/18 1037    Subjective   pt denis pain    Patient Stated Goals  improve handwriting, improve ease with shaving    Currently in Pain?  No/denies                           OT Education - 05/17/18 1042    Education Details  Coordination HEP-see pt instructions, handwriting strategies and use of larger pen, PWR! hands basic 4    Person(s) Educated  Patient    Methods  Explanation;Demonstration;Verbal cues;Handout    Comprehension  Verbalized understanding;Returned demonstration;Verbal  cues required       OT Short Term Goals - 05/04/18 1141      OT SHORT TERM GOAL #1   Title  Pt will be independent with updated PD specific HEP--check STG's 06/03/18    Time  4    Period  Weeks    Status  New      OT SHORT TERM GOAL #2   Title  Pt will be able to write at least 2 sentences with at least 75% legibility.    Baseline  25-50% legibility    Time  4    Period  Weeks    Status  New      OT SHORT TERM GOAL #3   Title  Pt will demonstrate increased LUE functional use as evidenced by increasing box/ blocks score by 4 blocks.    Baseline  [name] 50, LUE 47      OT SHORT TERM GOAL #4   Title  Pt will verbalize understanding of ways to prevent future complications and appropriate community resources.    Time  4    Period  Weeks    Status  New        OT Long Term Goals - 05/04/18 1143      OT LONG TERM GOAL #1   Title  Pt will verbalize understanding of large amplitude movement strategies to incr ease/efficiency with ADLs/IADLs.--check LTGs 07/02/18    Time  8    Period  Weeks    Status  New      OT LONG TERM GOAL #2   Title  Pt will improve ability to fasten buttons as evidenced by improving 3 button/ unbutton time to 38 secs or less    Baseline  42.79 secs    Time  8    Period  Weeks    Status  New      OT LONG TERM GOAL #3   Title  Pt will be able to write at least 3 sentences with at least 90% legibility.    Baseline  25%-50%    Time  8    Period  Weeks    Status  New      OT LONG TERM GOAL #4   Title  Pt will demonstrate ability to retrieve lightweight object at 120 shoulder flexion with LUE.    Baseline  110 shoulder flexion    Time  8    Period  Weeks    Status  New            Plan - 05/17/18 1044    Clinical Impression Statement  Pt is progressing towards goals. He demonstrates understanding of HEP and responds well to v.c for larger amplitude movements.    Occupational Profile and client history currently impacting functional performance   Pt is retired Teacher, English as a foreign language [employer], He lives with his wife and performs ADLS independently with increased time. Pt lives with his wife who has MS. PMH: Non Hodgkin's lymphoma, L eye legallly blind, Meige syndrome, low back pain    Occupational performance deficits (Please refer to evaluation for details):  ADL's;IADL's;Leisure;Social Participation;Rest and Sleep    Rehab Potential  Good    OT Frequency  2x / week    OT Duration  8 weeks    OT Treatment/Interventions  Self-care/ADL training;Therapeutic exercise;Visual/perceptual remediation/compensation;Patient/family education;Moist Heat;Paraffin;Neuromuscular education;Fluidtherapy;Therapeutic activities;Balance training;Passive range of motion;Manual Therapy;DME and/or AE instruction;Ultrasound;Contrast Bath;Cognitive remediation/compensation;Cryotherapy;Aquatic Therapy    Plan  ADL strategies, give pt handout for coordination HEP.    Consulted and Agree with Plan of Care  Patient       Patient will benefit from skilled therapeutic intervention in order to improve the following deficits and impairments:  Abnormal gait, Decreased cognition, Decreased knowledge of use of DME, Impaired flexibility, Impaired vision/preception, Decreased coordination, Decreased mobility, Decreased strength, Decreased range of motion, Decreased endurance, Decreased balance, Decreased knowledge of precautions, Decreased safety awareness, Difficulty walking, Impaired perceived functional ability, Impaired UE functional use  Visit Diagnosis: Other lack of coordination  Tremor, unspecified    Problem List Patient Active Problem List   Diagnosis Date Noted  . 10 year risk of MI or stroke 7.5% or greater 04/12/2018  . 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 Dysphagia, neurologic 07/27/2014  . Parkinson disease ([place]) 07/27/2014  . Dystonia 07/10/2014  . Low back pain 11/21/2011  . TRANSAMINASES,  SERUM, ELEVATED 07/06/2008  . DIARRHEA 05/23/2008  . LYMPH NODE-ENLARGED 07/28/2007  . Non-Hodgkin lymphoma ([place]) 03/23/2007    RINE,KATHRYN 05/17/2018, 10:47 AM [facility] [address] [place], [place], [address] Phone: [phone]  Fax:  [phone]  Name: [name] [name] MRN: [id number] Date of Birth: [birth year]

## 2018-05-18 ENCOUNTER — Ambulatory Visit: Payer: Medicare Other | Admitting: Speech Pathology

## 2018-05-18 DIAGNOSIS — R251 Tremor, unspecified: Secondary | ICD-10-CM | POA: Diagnosis not present

## 2018-05-18 DIAGNOSIS — R293 Abnormal posture: Secondary | ICD-10-CM | POA: Diagnosis not present

## 2018-05-18 DIAGNOSIS — R471 Dysarthria and anarthria: Secondary | ICD-10-CM

## 2018-05-18 DIAGNOSIS — R278 Other lack of coordination: Secondary | ICD-10-CM | POA: Diagnosis not present

## 2018-05-18 DIAGNOSIS — R29818 Other symptoms and signs involving the nervous system: Secondary | ICD-10-CM | POA: Diagnosis not present

## 2018-05-18 DIAGNOSIS — R41840 Attention and concentration deficit: Secondary | ICD-10-CM | POA: Diagnosis not present

## 2018-05-18 DIAGNOSIS — R2681 Unsteadiness on feet: Secondary | ICD-10-CM | POA: Diagnosis not present

## 2018-05-18 NOTE — Therapy (Signed)
[facility] [address] [place], [place], [address] Phone: [phone]   Fax:  [phone]  Speech Language Pathology Treatment  Patient Details  Name: [name] [name] MRN: [id number] Date of Birth: Dec 06, 1950 Referring Provider: Dr. Carles Collet   Encounter Date: 05/18/2018  End of Session - 05/18/18 0954    Visit Number  2    Number of Visits  17    Date for SLP Re-Evaluation  07/09/18    Authorization Type  30 visits ST, 0 used    SLP Start Time  0801    SLP Stop Time   0845    SLP Time Calculation (min)  44 min    Activity Tolerance  Patient tolerated treatment well       Past Medical History:  Diagnosis Date  . Cervical lymphadenopathy    right - followed my ENT  . Dystonia 1994   diagnosed in [place]  . Non Hodgkin's lymphoma ([place]) 2007   diffuse- 6 cycles of chemo with R CHOP Rituxan - last dose 10/08  . Parkinson disease ([place])    2015    Past Surgical History:  Procedure Laterality Date  . CHOLECYSTECTOMY, LAPAROSCOPIC    . EYE SURGERY     x2 as child  . PORT-A-CATH REMOVAL    . PORTACATH PLACEMENT    . SPLENECTOMY    . TONSILLECTOMY      There were no vitals filed for this visit.  Subjective Assessment - 05/18/18 0803    Subjective  "A little bit, not like I'm supposed to." re: /a/    Currently in Pain?  No/denies            ADULT SLP TREATMENT - 05/18/18 0801      General Information   Behavior/Cognition  Alert;Cooperative      Treatment Provided   Treatment provided  Cognitive-Linquistic      Pain Assessment   Pain Assessment  No/denies pain      Cognitive-Linquistic Treatment   Treatment focused on  Dysarthria    Skilled Treatment  Pt entered with sub WNL conversational volume, ranging low-mid 60s dB. SLP used loud /a/ to recalibrate pt's loudness in speech tasks and conversation. Initial min A for increased effort/duration, after which pt averaged 94 db at 30 cm. Pt rated his  effort level as 8/10. In phrase level task pt was asked to use this same effort level; average intensity increased 72dB, with rare min A for loudness. Usual min-mod A required for carryover in spontaneous reponses, initially, however SLP able to fade support cues to rare min A for simple conversation which pt maintained in upper 60s-low 70s for 5 minutes, after which pt noted difficulty maintaining this level of intensity. SLP educated re: rationale to complete loud /a/ and assigned loud speaking tasks at home 2x per day to help with building his endurance. Sentences and short spontaneous responses with occasional min A to maintain intensity in low 70s dB.       Assessment / Recommendations / Plan   Plan  Continue with current plan of care      Progression Toward Goals   Progression toward goals  Progressing toward goals       SLP Education - 05/18/18 0953    Education Details  need to complete loud /a/ 5 times, 2x per day    Person(s) Educated  Patient    Methods  Explanation    Comprehension  Verbalized understanding  SLP Short Term Goals - 05/18/18 0955      SLP SHORT TERM GOAL #1   Title  Pt will achieve 90dB average on loud /a/ over 3 sessions    Time  4    Period  Weeks    Status  On-going      SLP SHORT TERM GOAL #2   Title  Pt will mainitain average of 70dB during structured speech tasks and sentence level utteraces with occasional minc cues over two sessions    Time  4    Period  Weeks    Status  On-going      SLP SHORT TERM GOAL #3   Title  In 7 minutes simple conversation pt will achieve average speech volume of low 70s dB over two sessions     Time  4    Period  Weeks    Status  On-going       SLP Long Term Goals - 05/18/18 0956      SLP LONG TERM GOAL #1   Title  Pt will maintain average of 70dB over 12 minute simple conversation with rare min A over three sessions    Time  8    Period  Weeks    Status  On-going      SLP LONG TERM GOAL #2   Title  Pt  will be audible over 15 minute conversation in noisy environment/outside of therapy room with min rare A x 2 sessions    Time  8    Period  Weeks    Status  On-going      SLP LONG TERM GOAL #3   Title  Pt will report less frequent requests to repeat himself outside of [place] room over 2 visits.    Time  8    Period  Weeks    Status  On-going       Plan - 05/18/18 0954    Clinical Impression Statement  Mr. Mulford presents today with sub WNL vocal intensity due to hypokinetic dysarthria. Pt was highly stimulable for loud /a/ and subsequently louder speech in phrase level tasks. Carryover to 5 minutes conversation with min-mod A initially. I recommend skilled ST in order to improve speech intelligibility and pt's QOL. Will monitor cognition, assess and add goals as needed.    Speech Therapy Frequency  2x / week    Treatment/Interventions  Cognitive reorganization;Cueing hierarchy;Compensatory techniques;Internal/external aids;Functional tasks;SLP instruction and feedback;Patient/family education;Other (comment)    Potential to Achieve Goals  Good    SLP Home Exercise Plan  loud /a/, everyday phrases provided    Consulted and Agree with Plan of Care  Patient       Patient will benefit from skilled therapeutic intervention in order to improve the following deficits and impairments:   Dysarthria and anarthria    Problem List Patient Active Problem List   Diagnosis Date Noted  . 10 year risk of MI or stroke 7.5% or greater 04/12/2018  . 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 Dysphagia, neurologic 07/27/2014  . Parkinson disease ([place]) 07/27/2014  . Dystonia 07/10/2014  . Low back pain 11/21/2011  . TRANSAMINASES, SERUM, ELEVATED 07/06/2008  . DIARRHEA 05/23/2008  . LYMPH NODE-ENLARGED 07/28/2007  . Non-Hodgkin lymphoma ([place]) 03/23/2007   Deneise Lever, [place], [place] 05/18/2018, 9:56 AM  [facility] [name] [place], [place], [address] Phone: [phone]  Fax:  [phone]   Name: [name] [name] MRN: [id number] Date of Birth: May 19, 1951

## 2018-05-25 ENCOUNTER — Ambulatory Visit: Payer: Medicare Other | Attending: Neurology | Admitting: Occupational Therapy

## 2018-05-25 DIAGNOSIS — R2689 Other abnormalities of gait and mobility: Secondary | ICD-10-CM | POA: Insufficient documentation

## 2018-05-25 DIAGNOSIS — R41841 Cognitive communication deficit: Secondary | ICD-10-CM | POA: Diagnosis not present

## 2018-05-25 DIAGNOSIS — R293 Abnormal posture: Secondary | ICD-10-CM | POA: Insufficient documentation

## 2018-05-25 DIAGNOSIS — R41840 Attention and concentration deficit: Secondary | ICD-10-CM

## 2018-05-25 DIAGNOSIS — R2681 Unsteadiness on feet: Secondary | ICD-10-CM | POA: Diagnosis not present

## 2018-05-25 DIAGNOSIS — R278 Other lack of coordination: Secondary | ICD-10-CM | POA: Diagnosis not present

## 2018-05-25 DIAGNOSIS — R29818 Other symptoms and signs involving the nervous system: Secondary | ICD-10-CM | POA: Diagnosis not present

## 2018-05-25 DIAGNOSIS — R471 Dysarthria and anarthria: Secondary | ICD-10-CM | POA: Diagnosis not present

## 2018-05-25 DIAGNOSIS — R251 Tremor, unspecified: Secondary | ICD-10-CM | POA: Insufficient documentation

## 2018-05-25 DIAGNOSIS — R1312 Dysphagia, oropharyngeal phase: Secondary | ICD-10-CM | POA: Diagnosis not present

## 2018-05-25 NOTE — Therapy (Signed)
[facility] [address] [place], [place], [address] Phone: [phone]   Fax:  [phone]  Occupational Therapy Treatment  Patient Details  Name: [name] [name] MRN: [id number] Date of Birth: 1951-09-02 Referring Provider: Dr. Carles Collet   Encounter Date: 05/25/2018  OT End of Session - 05/25/18 1227    Visit Number  3    Number of Visits  17    Date for OT Re-Evaluation  08/02/18    Authorization Type  BCBS Medicare     Authorization Time Period  POC 8 weeks, auth period 90 days    Authorization - Visit Number  3    Authorization - Number of Visits  10    OT Start Time  0850    OT Stop Time  0935    OT Time Calculation (min)  45 min    Activity Tolerance  Patient tolerated treatment well    Behavior During Therapy  [facility] for tasks assessed/performed       Past Medical History:  Diagnosis Date  . Cervical lymphadenopathy    right - followed my ENT  . Dystonia 1994   diagnosed in [place]  . Non Hodgkin's lymphoma ([place]) 2007   diffuse- 6 cycles of chemo with R CHOP Rituxan - last dose 10/08  . Parkinson disease ([place])    2015    Past Surgical History:  Procedure Laterality Date  . CHOLECYSTECTOMY, LAPAROSCOPIC    . EYE SURGERY     x2 as child  . PORT-A-CATH REMOVAL    . PORTACATH PLACEMENT    . SPLENECTOMY    . TONSILLECTOMY      There were no vitals filed for this visit.  Subjective Assessment - 05/25/18 0852    Subjective   denies pain    Patient Stated Goals  improve handwriting, improve ease with shaving    Currently in Pain?  No/denies                treatment: PWR! Supine Up, rock and twist min v.c for slower performance for stretch. Supine closed chain shoulder flexion and diagonals with medium ball, min v.c/ facillitation Pt became dizzy when he stood, BP monitored it was 122/83. Pt started feeling better so he performed dynamic step and reach with trunk rotation, alternating UE's to  copy small peg design for cognitive component, 1 LOB requiring therapist assist to recover balance. Arm bike x 5 mins level 3 for conditioning, pt maintained 40 rpm            OT Short Term Goals - 05/04/18 1141      OT SHORT TERM GOAL #1   Title  Pt will be independent with updated PD specific HEP--check STG's 06/03/18    Time  4    Period  Weeks    Status  New      OT SHORT TERM GOAL #2   Title  Pt will be able to write at least 2 sentences with at least 75% legibility.    Baseline  25-50% legibility    Time  4    Period  Weeks    Status  New      OT SHORT TERM GOAL #3   Title  Pt will demonstrate increased LUE functional use as evidenced by increasing box/ blocks score by 4 blocks.    Baseline  [name] 50, LUE 47      OT SHORT TERM GOAL #4   Title  Pt will verbalize understanding  of ways to prevent future complications and appropriate community resources.    Time  4    Period  Weeks    Status  New        OT Long Term Goals - 05/04/18 1143      OT LONG TERM GOAL #1   Title  Pt will verbalize understanding of large amplitude movement strategies to incr ease/efficiency with ADLs/IADLs.--check LTGs 07/02/18    Time  8    Period  Weeks    Status  New      OT LONG TERM GOAL #2   Title  Pt will improve ability to fasten buttons as evidenced by improving 3 button/ unbutton time to 38 secs or less    Baseline  42.79 secs    Time  8    Period  Weeks    Status  New      OT LONG TERM GOAL #3   Title  Pt will be able to write at least 3 sentences with at least 90% legibility.    Baseline  25%-50%    Time  8    Period  Weeks    Status  New      OT LONG TERM GOAL #4   Title  Pt will demonstrate ability to retrieve lightweight object at 120 shoulder flexion with LUE.    Baseline  110 shoulder flexion    Time  8    Period  Weeks    Status  New            Plan - 05/25/18 1230    Clinical Impression Statement  Pt is progressing towards goals. He demonstrates  improved shoulder ROM following stretching.    Occupational performance deficits (Please refer to evaluation for details):  ADL's;IADL's;Leisure;Social Participation;Rest and Sleep    Rehab Potential  Good    OT Frequency  2x / week    OT Duration  8 weeks    OT Treatment/Interventions  Self-care/ADL training;Therapeutic exercise;Visual/perceptual remediation/compensation;Patient/family education;Moist Heat;Paraffin;Neuromuscular education;Fluidtherapy;Therapeutic activities;Balance training;Passive range of motion;Manual Therapy;DME and/or AE instruction;Ultrasound;Contrast Bath;Cognitive remediation/compensation;Cryotherapy;Aquatic Therapy    Plan  ADL strategies, give pt handout for coordination HEP.    Consulted and Agree with Plan of Care  Patient       Patient will benefit from skilled therapeutic intervention in order to improve the following deficits and impairments:  Abnormal gait, Decreased cognition, Decreased knowledge of use of DME, Impaired flexibility, Impaired vision/preception, Decreased coordination, Decreased mobility, Decreased strength, Decreased range of motion, Decreased endurance, Decreased balance, Decreased knowledge of precautions, Decreased safety awareness, Difficulty walking, Impaired perceived functional ability, Impaired UE functional use  Visit Diagnosis: Other lack of coordination  Other symptoms and signs involving the nervous system  Abnormal posture  Attention and concentration deficit  Unsteadiness on feet    Problem List Patient Active Problem List   Diagnosis Date Noted  . 10 year risk of MI or stroke 7.5% or greater 04/12/2018  . 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 Dysphagia, neurologic 07/27/2014  . Parkinson disease ([place]) 07/27/2014  . Dystonia 07/10/2014  . Low back pain 11/21/2011  . TRANSAMINASES, SERUM, ELEVATED 07/06/2008  . DIARRHEA 05/23/2008  . LYMPH  NODE-ENLARGED 07/28/2007  . Non-Hodgkin lymphoma ([place]) 03/23/2007    Zayveon Raschke 05/25/2018, 12:33 PM Theone Murdoch, OTR/L [phone] Phone: [phone] 12:45 PM 05/25/18 [facility] [address] [place], [place], [address] Phone: [phone]   Fax:  [phone]  Name: [name] [name] MRN: [id number] Date of Birth: [birth year]

## 2018-05-27 ENCOUNTER — Ambulatory Visit: Payer: Medicare Other | Admitting: Occupational Therapy

## 2018-05-27 ENCOUNTER — Encounter: Payer: Self-pay | Admitting: Speech Pathology

## 2018-05-27 ENCOUNTER — Ambulatory Visit: Payer: Medicare Other | Admitting: Speech Pathology

## 2018-05-27 DIAGNOSIS — R293 Abnormal posture: Secondary | ICD-10-CM

## 2018-05-27 DIAGNOSIS — R41840 Attention and concentration deficit: Secondary | ICD-10-CM | POA: Diagnosis not present

## 2018-05-27 DIAGNOSIS — R29818 Other symptoms and signs involving the nervous system: Secondary | ICD-10-CM | POA: Diagnosis not present

## 2018-05-27 DIAGNOSIS — R2681 Unsteadiness on feet: Secondary | ICD-10-CM

## 2018-05-27 DIAGNOSIS — R278 Other lack of coordination: Secondary | ICD-10-CM

## 2018-05-27 DIAGNOSIS — R471 Dysarthria and anarthria: Secondary | ICD-10-CM | POA: Diagnosis not present

## 2018-05-27 NOTE — Patient Instructions (Signed)
   We generate volume with our breath - when you are asked to repeat yourself, take a big breath  Project like you are talking to someone who is over the fence outside  Continue loud AH's! 5x twice a day, then shout short phrases   Practice talking with loud volume 5-10 minutes a day  Work on endurance and feel like you are projecting your voice - it takes work to use good intensity for volume  It's OK to feel like you are shouting - you are not

## 2018-05-27 NOTE — Therapy (Signed)
[facility] [address] [place], [place], [address] Phone: [phone]   Fax:  [phone]  Speech Language Pathology Treatment  Patient Details  Name: [name] [name] MRN: [id number] Date of Birth: January 12, 1951 Referring Provider: Dr. Carles Collet   Encounter Date: 05/27/2018  End of Session - 05/27/18 1011    Visit Number  3    Number of Visits  17    Date for SLP Re-Evaluation  07/09/18    Authorization Type  30 visits ST, 0 used    Authorization - Visit Number  3    Authorization - Number of Visits  30    SLP Start Time  0930    SLP Stop Time   1009    SLP Time Calculation (min)  39 min    Activity Tolerance  Patient tolerated treatment well       Past Medical History:  Diagnosis Date  . Cervical lymphadenopathy    right - followed my ENT  . Dystonia 1994   diagnosed in [place]  . Non Hodgkin's lymphoma ([place]) 2007   diffuse- 6 cycles of chemo with R CHOP Rituxan - last dose 10/08  . Parkinson disease ([place])    2015    Past Surgical History:  Procedure Laterality Date  . CHOLECYSTECTOMY, LAPAROSCOPIC    . EYE SURGERY     x2 as child  . PORT-A-CATH REMOVAL    . PORTACATH PLACEMENT    . SPLENECTOMY    . TONSILLECTOMY      There were no vitals filed for this visit.  Subjective Assessment - 05/27/18 0935    Subjective  "Nobody is hearing me - even out there - (points to gym)    Currently in Pain?  No/denies            ADULT SLP TREATMENT - 05/27/18 0937      General Information   Behavior/Cognition  Alert;Cooperative      Treatment Provided   Treatment provided  Cognitive-Linquistic      Cognitive-Linquistic Treatment   Treatment focused on  Dysarthria    Skilled Treatment  Pt enters room with 68-70dB average. Loud /a/ to recalibrate volume average 91dB with rare min A. Structured speech task with reading and spontaneous phrases average 70dB with occasional min A to maintain volume and intensity  with spontaneous phrases. 10 minute conversation pt maintained average of low 70'sdB. with occasional min visual cues. Pt reports he is making a effort to project and finds it tiring. Re-educated pt re: building endurance.      Assessment / Recommendations / Plan   Plan  Continue with current plan of care      Progression Toward Goals   Progression toward goals  Progressing toward goals       SLP Education - 05/27/18 1008    Education Details  add short phrases after loud /a/, use breath to generate volume    Person(s) Educated  Patient    Methods  Explanation;Demonstration    Comprehension  Verbalized understanding;Returned demonstration;Need further instruction       SLP Short Term Goals - 05/27/18 1011      SLP SHORT TERM GOAL #1   Title  Pt will achieve 90dB average on loud /a/ over 3 sessions    Time  4    Period  Weeks    Status  On-going      SLP SHORT TERM GOAL #2   Title  Pt will mainitain average of 70dB  during structured speech tasks and sentence level utteraces with occasional minc cues over two sessions    Time  4    Period  Weeks    Status  On-going      SLP SHORT TERM GOAL #3   Title  In 7 minutes simple conversation pt will achieve average speech volume of low 70s dB over two sessions     Time  4    Period  Weeks    Status  On-going       SLP Long Term Goals - 05/27/18 1011      SLP LONG TERM GOAL #1   Title  Pt will maintain average of 70dB over 12 minute simple conversation with rare min A over three sessions    Time  8    Period  Weeks    Status  On-going      SLP LONG TERM GOAL #2   Title  Pt will be audible over 15 minute conversation in noisy environment/outside of therapy room with min rare A x 2 sessions    Time  8    Period  Weeks    Status  On-going      SLP LONG TERM GOAL #3   Title  Pt will report less frequent requests to repeat himself outside of [place] room over 2 visits.    Time  8    Period  Weeks    Status  On-going        Plan - 05/27/18 1010    Clinical Impression Statement  [name] presents today with sub WNL vocal intensity due to hypokinetic dysarthria. Pt was highly stimulable for loud /a/ and subsequently louder speech in phrase level tasks. Carryover to 5 minutes conversation with min-mod A initially. I recommend skilled ST in order to improve speech intelligibility and pt's QOL. Will monitor cognition, assess and add goals as needed.    Speech Therapy Frequency  2x / week    Treatment/Interventions  Cognitive reorganization;Cueing hierarchy;Compensatory techniques;Internal/external aids;Functional tasks;SLP instruction and feedback;Patient/family education;Other (comment)    Potential to Achieve Goals  Good    SLP Home Exercise Plan  loud /a/, everyday phrases provided    Consulted and Agree with Plan of Care  Patient       Patient will benefit from skilled therapeutic intervention in order to improve the following deficits and impairments:   Dysarthria and anarthria    Problem List Patient Active Problem List   Diagnosis Date Noted  . 10 year risk of MI or stroke 7.5% or greater 04/12/2018  . 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 Dysphagia, neurologic 07/27/2014  . Parkinson disease ([place]) 07/27/2014  . Dystonia 07/10/2014  . Low back pain 11/21/2011  . TRANSAMINASES, SERUM, ELEVATED 07/06/2008  . DIARRHEA 05/23/2008  . LYMPH NODE-ENLARGED 07/28/2007  . Non-Hodgkin lymphoma ([place]) 03/23/2007    Sidnee Gambrill, [name] MS, CCC-SLP 05/27/2018, 10:12 AM  [facility] [address] [place], [place], [address] Phone: [phone]   Fax:  [phone]   Name: [name] [name] MRN: [id number] Date of Birth: 04-02-1951

## 2018-05-27 NOTE — Therapy (Signed)
[facility] [address] [place], [place], [address] Phone: [phone]   Fax:  [phone]  Occupational Therapy Treatment  Patient Details  Name: [name] [name] MRN: [id number] Date of Birth: [birth year] Referring Provider: Dr. Carles Collet   Encounter Date: 05/27/2018  OT End of Session - 05/27/18 0846    Visit Number  4    Number of Visits  17    Date for OT Re-Evaluation  08/02/18    Authorization Type  [employer] Medicare     Authorization Time Period  POC 8 weeks, auth period 90 days    Authorization - Visit Number  4    Authorization - Number of Visits  10    OT Start Time  0805    OT Stop Time  0845    OT Time Calculation (min)  40 min    Activity Tolerance  Patient tolerated treatment well    Behavior During Therapy  [facility] for tasks assessed/performed       Past Medical History:  Diagnosis Date  . Cervical lymphadenopathy    right - followed my ENT  . Dystonia 1994   diagnosed in [place]  . Non Hodgkin's lymphoma ([place]) 2007   diffuse- 6 cycles of chemo with R CHOP Rituxan - last dose 10/08  . Parkinson disease ([place])    2015    Past Surgical History:  Procedure Laterality Date  . CHOLECYSTECTOMY, LAPAROSCOPIC    . EYE SURGERY     x2 as child  . PORT-A-CATH REMOVAL    . PORTACATH PLACEMENT    . SPLENECTOMY    . TONSILLECTOMY      There were no vitals filed for this visit.  Subjective Assessment - 05/27/18 0846    Patient Stated Goals  improve handwriting, improve ease with shaving    Currently in Pain?  No/denies               Treatment: PWR! Up, rock and twist in seated a s warm up, 10 reps each, followed by dynamic steppe and reach to flip large playing cards with alternate UE's, then tossing scarves to targets with trunk rotation, large amplitude movements, min v.c Ambulating while tossing a scarf between hands, min difficulty, v.c for larger amplitude movements and dual tasking Reviewed  fastening buttons with adapted techniques with good performance. Placing grooved pegs into pegboard with right and left UE's for improved fine motor coordination, min v.c,  Arm bike x 5 mins level 3 for conditioning, pt maintained greater than 40 rpm.             OT Short Term Goals - 05/04/18 1141      OT SHORT TERM GOAL #1   Title  Pt will be independent with updated PD specific HEP--check STG's 06/03/18    Time  4    Period  Weeks    Status  New      OT SHORT TERM GOAL #2   Title  Pt will be able to write at least 2 sentences with at least 75% legibility.    Baseline  25-50% legibility    Time  4    Period  Weeks    Status  New      OT SHORT TERM GOAL #3   Title  Pt will demonstrate increased LUE functional use as evidenced by increasing box/ blocks score by 4 blocks.    Baseline  [name] 50, LUE 47      OT SHORT TERM  GOAL #4   Title  Pt will verbalize understanding of ways to prevent future complications and appropriate community resources.    Time  4    Period  Weeks    Status  New        OT Long Term Goals - 05/04/18 1143      OT LONG TERM GOAL #1   Title  Pt will verbalize understanding of large amplitude movement strategies to incr ease/efficiency with ADLs/IADLs.--check LTGs 07/02/18    Time  8    Period  Weeks    Status  New      OT LONG TERM GOAL #2   Title  Pt will improve ability to fasten buttons as evidenced by improving 3 button/ unbutton time to 38 secs or less    Baseline  42.79 secs    Time  8    Period  Weeks    Status  New      OT LONG TERM GOAL #3   Title  Pt will be able to write at least 3 sentences with at least 90% legibility.    Baseline  25%-50%    Time  8    Period  Weeks    Status  New      OT LONG TERM GOAL #4   Title  Pt will demonstrate ability to retrieve lightweight object at 120 shoulder flexion with LUE.    Baseline  110 shoulder flexion    Time  8    Period  Weeks    Status  New            Plan - 05/27/18  1434    Clinical Impression Statement  Pt is progressing towards goals. Pt reponds well to v.c for larger amplitude movments.    Rehab Potential  Good    OT Frequency  2x / week    OT Duration  8 weeks    OT Treatment/Interventions  Self-care/ADL training;Therapeutic exercise;Visual/perceptual remediation/compensation;Patient/family education;Moist Heat;Paraffin;Neuromuscular education;Fluidtherapy;Therapeutic activities;Balance training;Passive range of motion;Manual Therapy;DME and/or AE instruction;Ultrasound;Contrast Bath;Cognitive remediation/compensation;Cryotherapy;Aquatic Therapy    Plan  ADL strategies, continue to address large amplitude movements with functional activity    Consulted and Agree with Plan of Care  Patient       Patient will benefit from skilled therapeutic intervention in order to improve the following deficits and impairments:  Abnormal gait, Decreased cognition, Decreased knowledge of use of DME, Impaired flexibility, Impaired vision/preception, Decreased coordination, Decreased mobility, Decreased strength, Decreased range of motion, Decreased endurance, Decreased balance, Decreased knowledge of precautions, Decreased safety awareness, Difficulty walking, Impaired perceived functional ability, Impaired UE functional use  Visit Diagnosis: Other lack of coordination  Other symptoms and signs involving the nervous system  Abnormal posture  Attention and concentration deficit  Unsteadiness on feet    Problem List Patient Active Problem List   Diagnosis Date Noted  . 10 year risk of MI or stroke 7.5% or greater 04/12/2018  . 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 Dysphagia, neurologic 07/27/2014  . Parkinson disease ([place]) 07/27/2014  . Dystonia 07/10/2014  . Low back pain 11/21/2011  . TRANSAMINASES, SERUM, ELEVATED 07/06/2008  . DIARRHEA 05/23/2008  . LYMPH NODE-ENLARGED 07/28/2007  .  Non-Hodgkin lymphoma ([place]) 03/23/2007    [name] [name] 05/27/2018, 2:35 PM  [facility] [address] [place], [place], [address] Phone: [phone]   Fax:  [phone]  Name: [name] [name] MRN: [id number] Date of Birth: [birth year]

## 2018-05-28 ENCOUNTER — Encounter: Payer: Medicare Other | Admitting: Speech Pathology

## 2018-05-31 ENCOUNTER — Ambulatory Visit: Payer: Medicare Other | Admitting: Speech Pathology

## 2018-05-31 ENCOUNTER — Ambulatory Visit: Payer: Medicare Other | Admitting: Occupational Therapy

## 2018-05-31 ENCOUNTER — Encounter: Payer: Self-pay | Admitting: Occupational Therapy

## 2018-05-31 DIAGNOSIS — R471 Dysarthria and anarthria: Secondary | ICD-10-CM

## 2018-05-31 DIAGNOSIS — R2681 Unsteadiness on feet: Secondary | ICD-10-CM

## 2018-05-31 DIAGNOSIS — R251 Tremor, unspecified: Secondary | ICD-10-CM

## 2018-05-31 DIAGNOSIS — R293 Abnormal posture: Secondary | ICD-10-CM

## 2018-05-31 DIAGNOSIS — R278 Other lack of coordination: Secondary | ICD-10-CM | POA: Diagnosis not present

## 2018-05-31 DIAGNOSIS — R29818 Other symptoms and signs involving the nervous system: Secondary | ICD-10-CM | POA: Diagnosis not present

## 2018-05-31 DIAGNOSIS — R41840 Attention and concentration deficit: Secondary | ICD-10-CM

## 2018-05-31 NOTE — Therapy (Signed)
[facility] [address] [place], [place], [address] Phone: [phone]   Fax:  [phone]  Occupational Therapy Treatment  Patient Details  Name: [name] [name] MRN: [id number] Date of Birth: 06-Nov-1950 Referring Provider: Dr. Carles Collet   Encounter Date: 05/31/2018  OT End of Session - 05/31/18 1322    Visit Number  5    Number of Visits  17    Date for OT Re-Evaluation  08/02/18    Authorization Type  [employer] Medicare     Authorization Time Period  POC 8 weeks, auth period 90 days    Authorization - Visit Number  5    Authorization - Number of Visits  10    OT Start Time  1320    OT Stop Time  1400    OT Time Calculation (min)  40 min    Activity Tolerance  Patient tolerated treatment well    Behavior During Therapy  [facility] for tasks assessed/performed       Past Medical History:  Diagnosis Date  . Cervical lymphadenopathy    right - followed my ENT  . Dystonia 1994   diagnosed in [place]  . Non Hodgkin's lymphoma ([place]) 2007   diffuse- 6 cycles of chemo with R CHOP Rituxan - last dose 10/08  . Parkinson disease ([place])    2015    Past Surgical History:  Procedure Laterality Date  . CHOLECYSTECTOMY, LAPAROSCOPIC    . EYE SURGERY     x2 as child  . PORT-A-CATH REMOVAL    . PORTACATH PLACEMENT    . SPLENECTOMY    . TONSILLECTOMY      There were no vitals filed for this visit.  Subjective Assessment - 05/31/18 1322    Subjective   "I just feel like I'm rushing and I can't stop"    Pertinent History  non-hodgkin lymphoma with hx of chemo, dystonia, Meige syndrome    Patient Stated Goals  improve handwriting, improve ease with shaving    Currently in Pain?  No/denies         PWR! Moves in modified quadruped (up, rock, twist)  x15-20 each with min cueing initially.  PWR! Hands (basic 4) x 10 each.  Writing sentences with approx 90% legibility with decr legibility with errors/cross outs.  Tracing letters,  numbers, and continuous "l"/loops.  Pt then wrote each with min cueing to slow down.  Recommended pt stop lift pen/stretch frequently to help with pacing as legibility decr when pt rushes.  Then practiced on large graph paper with 1 letter per box to help with legibility/pacing.  Pt with 100% legibility and improved letter size.    Discussed timing deficits with PD and emphasis on start/stop of movement, pauses to assist with this.  Pt verbalized understanding.     OT Education - 05/31/18 1514    Education Details  Recommended trying to write with graph paper to help with timing/pacing with 1 letter per box    Person(s) Educated  Patient    Methods  Explanation;Demonstration;Verbal cues;Handout    Comprehension  Verbalized understanding;Returned demonstration       OT Short Term Goals - 05/04/18 1141      OT SHORT TERM GOAL #1   Title  Pt will be independent with updated PD specific HEP--check STG's 06/03/18    Time  4    Period  Weeks    Status  New      OT SHORT TERM GOAL #2  Title  Pt will be able to write at least 2 sentences with at least 75% legibility.    Baseline  25-50% legibility    Time  4    Period  Weeks    Status  New      OT SHORT TERM GOAL #3   Title  Pt will demonstrate increased LUE functional use as evidenced by increasing box/ blocks score by 4 blocks.    Baseline  [name] 50, LUE 47      OT SHORT TERM GOAL #4   Title  Pt will verbalize understanding of ways to prevent future complications and appropriate community resources.    Time  4    Period  Weeks    Status  New        OT Long Term Goals - 05/04/18 1143      OT LONG TERM GOAL #1   Title  Pt will verbalize understanding of large amplitude movement strategies to incr ease/efficiency with ADLs/IADLs.--check LTGs 07/02/18    Time  8    Period  Weeks    Status  New      OT LONG TERM GOAL #2   Title  Pt will improve ability to fasten buttons as evidenced by improving 3 button/ unbutton time to 38  secs or less    Baseline  42.79 secs    Time  8    Period  Weeks    Status  New      OT LONG TERM GOAL #3   Title  Pt will be able to write at least 3 sentences with at least 90% legibility.    Baseline  25%-50%    Time  8    Period  Weeks    Status  New      OT LONG TERM GOAL #4   Title  Pt will demonstrate ability to retrieve lightweight object at 120 shoulder flexion with LUE.    Baseline  110 shoulder flexion    Time  8    Period  Weeks    Status  New            Plan - 05/31/18 1340    Clinical Impression Statement  Pt with difficulty with timing/pacing for writing resulting in decr legibility.    Rehab Potential  Good    OT Frequency  2x / week    OT Duration  8 weeks    OT Treatment/Interventions  Self-care/ADL training;Therapeutic exercise;Visual/perceptual remediation/compensation;Patient/family education;Moist Heat;Paraffin;Neuromuscular education;Fluidtherapy;Therapeutic activities;Balance training;Passive range of motion;Manual Therapy;DME and/or AE instruction;Ultrasound;Contrast Bath;Cognitive remediation/compensation;Cryotherapy;Aquatic Therapy    Plan  ADL strategies, continue to address large amplitude movements with functional activity    Consulted and Agree with Plan of Care  Patient       Patient will benefit from skilled therapeutic intervention in order to improve the following deficits and impairments:  Abnormal gait, Decreased cognition, Decreased knowledge of use of DME, Impaired flexibility, Impaired vision/preception, Decreased coordination, Decreased mobility, Decreased strength, Decreased range of motion, Decreased endurance, Decreased balance, Decreased knowledge of precautions, Decreased safety awareness, Difficulty walking, Impaired perceived functional ability, Impaired UE functional use  Visit Diagnosis: Other lack of coordination  Other symptoms and signs involving the nervous system  Abnormal posture  Attention and concentration  deficit  Unsteadiness on feet  Tremor, unspecified    Problem List Patient Active Problem List   Diagnosis Date Noted  . 10 year risk of MI or stroke 7.5% or greater 04/12/2018  . 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 Dysphagia, neurologic 07/27/2014  . Parkinson disease ([place]) 07/27/2014  . Dystonia 07/10/2014  . Low back pain 11/21/2011  . TRANSAMINASES, SERUM, ELEVATED 07/06/2008  . DIARRHEA 05/23/2008  . LYMPH NODE-ENLARGED 07/28/2007  . Non-Hodgkin lymphoma ([place]) 03/23/2007    [facility] 05/31/2018, 3:15 PM  [facility] [address] [place], [place], [address] Phone: [phone]   Fax:  [phone]  Name: BIANCA VESTER MRN: [id number] Date of Birth: [birth year]   Vianne Bulls, OTR/L [facility] [address] [place], [place]  [address] [phone] phone [phone] 05/31/18 3:19 PM

## 2018-05-31 NOTE — Therapy (Signed)
[facility] [address] [place], [place], [address] Phone: [phone]   Fax:  [phone]  Speech Language Pathology Treatment  Patient Details  Name: [name] [name] MRN: [id number] Date of Birth: 09/22/1951 Referring Provider: Dr. Carles Collet   Encounter Date: 05/31/2018  End of Session - 05/31/18 1713    Visit Number  4    Number of Visits  17    Date for SLP Re-Evaluation  07/09/18    Authorization - Visit Number  4    Authorization - Number of Visits  30    SLP Start Time  7564    SLP Stop Time   1446    SLP Time Calculation (min)  40 min    Activity Tolerance  Patient tolerated treatment well       Past Medical History:  Diagnosis Date  . Cervical lymphadenopathy    right - followed my ENT  . Dystonia 1994   diagnosed in [place]  . Non Hodgkin's lymphoma ([place]) 2007   diffuse- 6 cycles of chemo with R CHOP Rituxan - last dose 10/08  . Parkinson disease ([place])    2015    Past Surgical History:  Procedure Laterality Date  . CHOLECYSTECTOMY, LAPAROSCOPIC    . EYE SURGERY     x2 as child  . PORT-A-CATH REMOVAL    . PORTACATH PLACEMENT    . SPLENECTOMY    . TONSILLECTOMY      There were no vitals filed for this visit.  Subjective Assessment - 05/31/18 1408    Subjective  "That's my goal is to be loud on the meter"    Currently in Pain?  No/denies    Pain Score  0-No pain            ADULT SLP TREATMENT - 05/31/18 1406      General Information   Behavior/Cognition  Alert;Cooperative      Treatment Provided   Treatment provided  Cognitive-Linquistic      Pain Assessment   Pain Assessment  No/denies pain      Cognitive-Linquistic Treatment   Treatment focused on  Dysarthria    Skilled Treatment  Pt enters room with 69-71dB average. Loud /a/ to recalibrate volume average 93dB with one verbal cue for increased mouth opening. Structured speech tasks (1-2 sentences) and spontaneous reponses  averaged low 70s dB with rare min A to maintain volume. 15 minutes multiparagraph reading average low 70s dB with rare min A. Carryover to conversation 7 minutes which pt maintained in low 70s with rare visual cues. Pt remarked at end of session, "I don't feel as tired as last time."      Assessment / Recommendations / Plan   Plan  Continue with current plan of care      Progression Toward Goals   Progression toward goals  Progressing toward goals         SLP Short Term Goals - 05/31/18 1715      SLP SHORT TERM GOAL #1   Title  Pt will achieve 90dB average on loud /a/ over 3 sessions    Baseline  05/31/18    Time  3    Period  Weeks    Status  On-going      SLP SHORT TERM GOAL #2   Title  Pt will mainitain average of 70dB during structured speech tasks and sentence level utteraces with occasional minc cues over two sessions    Time  3  Period  Weeks    Status  On-going      SLP SHORT TERM GOAL #3   Title  In 7 minutes simple conversation pt will achieve average speech volume of low 70s dB over two sessions     Time  3    Period  Weeks    Status  On-going       SLP Long Term Goals - 05/31/18 1716      SLP LONG TERM GOAL #1   Title  Pt will maintain average of 70dB over 12 minute simple conversation with rare min A over three sessions    Time  7    Period  Weeks    Status  On-going      SLP LONG TERM GOAL #2   Title  Pt will be audible over 15 minute conversation in noisy environment/outside of therapy room with min rare A x 2 sessions    Time  7    Period  Weeks    Status  On-going      SLP LONG TERM GOAL #3   Title  Pt will report less frequent requests to repeat himself outside of [place] room over 2 visits.    Time  7    Period  Weeks    Status  On-going       Plan - 05/31/18 1714    Clinical Impression Statement  Mr. 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 Pt was highly stimulable for loud /a/ and subsequently louder  speech in phrase level tasks. Maintained avg dB in low 70s for multiparagraph reading, with subsequent carryover to 7 minutes conversation with rare visual cues. I recommend skilled ST in order to improve speech intelligibility and pt's QOL. Will monitor cognition, assess and add goals as needed.    Speech Therapy Frequency  2x / week    Treatment/Interventions  Cognitive reorganization;Cueing hierarchy;Compensatory techniques;Internal/external aids;Functional tasks;SLP instruction and feedback;Patient/family education;Other (comment)    Potential to Achieve Goals  Good    Consulted and Agree with Plan of Care  Patient       Patient will benefit from skilled therapeutic intervention in order to improve the following deficits and impairments:   Dysarthria and anarthria    Problem List Patient Active Problem List   Diagnosis Date Noted  . 10 year risk of MI or stroke 7.5% or greater 04/12/2018  . 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 Dysphagia, neurologic 07/27/2014  . Parkinson disease ([place]) 07/27/2014  . Dystonia 07/10/2014  . Low back pain 11/21/2011  . TRANSAMINASES, SERUM, ELEVATED 07/06/2008  . DIARRHEA 05/23/2008  . LYMPH NODE-ENLARGED 07/28/2007  . Non-Hodgkin lymphoma ([place]) 03/23/2007   Deneise Lever, [place], [place] Hanf 05/31/2018, 5:16 PM  [facility] [address] [place], [place], [address] Phone: [phone]   Fax:  [phone]   Name: VINAY ERTL MRN: [id number] Date of Birth: May 13, 1951

## 2018-05-31 NOTE — Patient Instructions (Signed)
   Try writing on graph paper.  Write with one letter in each box.  Can turn the paper to horizontally.

## 2018-06-03 ENCOUNTER — Ambulatory Visit: Payer: Medicare Other | Admitting: Occupational Therapy

## 2018-06-03 ENCOUNTER — Ambulatory Visit: Payer: Medicare Other | Admitting: Speech Pathology

## 2018-06-03 ENCOUNTER — Encounter: Payer: Self-pay | Admitting: Occupational Therapy

## 2018-06-03 DIAGNOSIS — R41840 Attention and concentration deficit: Secondary | ICD-10-CM

## 2018-06-03 DIAGNOSIS — R293 Abnormal posture: Secondary | ICD-10-CM | POA: Diagnosis not present

## 2018-06-03 DIAGNOSIS — R278 Other lack of coordination: Secondary | ICD-10-CM | POA: Diagnosis not present

## 2018-06-03 DIAGNOSIS — R29818 Other symptoms and signs involving the nervous system: Secondary | ICD-10-CM

## 2018-06-03 DIAGNOSIS — R2681 Unsteadiness on feet: Secondary | ICD-10-CM | POA: Diagnosis not present

## 2018-06-03 DIAGNOSIS — R2689 Other abnormalities of gait and mobility: Secondary | ICD-10-CM

## 2018-06-03 DIAGNOSIS — R471 Dysarthria and anarthria: Secondary | ICD-10-CM | POA: Diagnosis not present

## 2018-06-03 DIAGNOSIS — R41841 Cognitive communication deficit: Secondary | ICD-10-CM

## 2018-06-03 DIAGNOSIS — R251 Tremor, unspecified: Secondary | ICD-10-CM

## 2018-06-03 NOTE — Therapy (Signed)
[facility] [address] [place], [place], [address] Phone: [phone]   Fax:  [phone]  Occupational Therapy Treatment  Patient Details  Name: [name] [name] MRN: [id number] Date of Birth: May 19, 1951 Referring Provider: Dr. Carles Collet   Encounter Date: 06/03/2018  OT End of Session - 06/03/18 0838    Visit Number  6    Number of Visits  17    Date for OT Re-Evaluation  08/02/18    Authorization Type  BC[employer] Medicare     Authorization Time Period  POC 8 weeks, auth period 90 days    Authorization - Visit Number  6    Authorization - Number of Visits  10    OT Start Time  [phone]    OT Stop Time  0915    OT Time Calculation (min)  39 min    Activity Tolerance  Patient tolerated treatment well    Behavior During Therapy  [facility] for tasks assessed/performed       Past Medical History:  Diagnosis Date  . Cervical lymphadenopathy    right - followed my ENT  . Dystonia 1994   diagnosed in [place]  . Non Hodgkin's lymphoma ([place]) 2007   diffuse- 6 cycles of chemo with R CHOP Rituxan - last dose 10/08  . Parkinson disease ([place])    2015    Past Surgical History:  Procedure Laterality Date  . CHOLECYSTECTOMY, LAPAROSCOPIC    . EYE SURGERY     x2 as child  . PORT-A-CATH REMOVAL    . PORTACATH PLACEMENT    . SPLENECTOMY    . TONSILLECTOMY      There were no vitals filed for this visit.  Subjective Assessment - 06/03/18 0835    Subjective   sometimes I am impressed with my writing and other times I'm not    Pertinent History  non-hodgkin lymphoma with hx of chemo, dystonia, Meige syndrome    Patient Stated Goals  improve handwriting, improve ease with shaving    Currently in Pain?  No/denies          Sliding cards off table by using PWR! Hands with focus on finger ext and min cues for incr movement amplitude.  In standing, functional step and reach in diagonal pattern incorporating trunk rotation/wt. shift and  PWR! reach to place large pegs in vertical pegboard (for coordination) with min cueing.  Pt fitted for size 8, oval 8 splint for L 4th digit PIP subluxation causing 4th digit to lock in flex.   Pt demo decr locking with splint.  Pt educated in splint wear/care and purpose.  Also discussed importance of MP ext with functional activities to decr risk of future complications.  Pt verbalized understanding.  PWR! Multi-directional Movements:   multi-directional reaching with stepping with min difficulty/cues for dual task/cognitive component and large amplitude.  Then repeated reaching and stepping while calling out given colors with min difficulties/cues for dual task/additional cognitive component.  Practiced fastening/unfastening buttons with min difficulty/cues for use of PWR! Hands/large amplitude movement techniques.     OT Short Term Goals - 05/04/18 1141      OT SHORT TERM GOAL #1   Title  Pt will be independent with updated PD specific HEP--check STG's 06/03/18    Time  4    Period  Weeks    Status  New      OT SHORT TERM GOAL #2   Title  Pt will be able to write at least  2 sentences with at least 75% legibility.    Baseline  25-50% legibility    Time  4    Period  Weeks    Status  New      OT SHORT TERM GOAL #3   Title  Pt will demonstrate increased LUE functional use as evidenced by increasing box/ blocks score by 4 blocks.    Baseline  [name] 50, LUE 47      OT SHORT TERM GOAL #4   Title  Pt will verbalize understanding of ways to prevent future complications and appropriate community resources.    Time  4    Period  Weeks    Status  New        OT Long Term Goals - 05/04/18 1143      OT LONG TERM GOAL #1   Title  Pt will verbalize understanding of large amplitude movement strategies to incr ease/efficiency with ADLs/IADLs.--check LTGs 07/02/18    Time  8    Period  Weeks    Status  New      OT LONG TERM GOAL #2   Title  Pt will improve ability to fasten buttons as  evidenced by improving 3 button/ unbutton time to 38 secs or less    Baseline  42.79 secs    Time  8    Period  Weeks    Status  New      OT LONG TERM GOAL #3   Title  Pt will be able to write at least 3 sentences with at least 90% legibility.    Baseline  25%-50%    Time  8    Period  Weeks    Status  New      OT LONG TERM GOAL #4   Title  Pt will demonstrate ability to retrieve lightweight object at 120 shoulder flexion with LUE.    Baseline  110 shoulder flexion    Time  8    Period  Weeks    Status  New            Plan - 06/03/18 2025    Clinical Impression Statement  Pt continues to demo difficulty with timing (rushing) affecting functional movements.    Rehab Potential  Good    OT Frequency  2x / week    OT Duration  8 weeks    OT Treatment/Interventions  Self-care/ADL training;Therapeutic exercise;Visual/perceptual remediation/compensation;Patient/family education;Moist Heat;Paraffin;Neuromuscular education;Fluidtherapy;Therapeutic activities;Balance training;Passive range of motion;Manual Therapy;DME and/or AE instruction;Ultrasound;Contrast Bath;Cognitive remediation/compensation;Cryotherapy;Aquatic Therapy    Plan  ADL strategies, continue to address large amplitude movements with functional activity    Consulted and Agree with Plan of Care  Patient       Patient will benefit from skilled therapeutic intervention in order to improve the following deficits and impairments:  Abnormal gait, Decreased cognition, Decreased knowledge of use of DME, Impaired flexibility, Impaired vision/preception, Decreased coordination, Decreased mobility, Decreased strength, Decreased range of motion, Decreased endurance, Decreased balance, Decreased knowledge of precautions, Decreased safety awareness, Difficulty walking, Impaired perceived functional ability, Impaired UE functional use  Visit Diagnosis: Other lack of coordination  Other symptoms and signs involving the nervous  system  Abnormal posture  Attention and concentration deficit  Unsteadiness on feet  Tremor, unspecified  Other abnormalities of gait and mobility    Problem List Patient Active Problem List   Diagnosis Date Noted  . 10 year risk of MI or stroke 7.5% or greater 04/12/2018  . 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 Meige syndrome (blepharospasm  with oromandibular dystonia) 07/27/2014  . Dysphagia, neurologic 07/27/2014  . Parkinson disease ([place]) 07/27/2014  . Dystonia 07/10/2014  . Low back pain 11/21/2011  . TRANSAMINASES, SERUM, ELEVATED 07/06/2008  . DIARRHEA 05/23/2008  . LYMPH NODE-ENLARGED 07/28/2007  . Non-Hodgkin lymphoma ([place]) 03/23/2007    [facility] 06/03/2018, 9:08 AM  [facility] [address] [place], [place], [address] Phone: [phone]   Fax:  [phone]  Name: [name] [name] MRN: [id number] Date of Birth: 11-07-1950   Vianne Bulls, OTR/L [facility] [address] [place], [place]  [phone] phone [phone] 06/03/18 9:08 AM

## 2018-06-03 NOTE — Therapy (Signed)
[facility] [address] [place], [place], [address] Phone: [phone]   Fax:  [phone]  Speech Language Pathology Treatment  Patient Details  Name: [name] [name] MRN: [id number] Date of Birth: [birth year] Referring Provider: Dr. Carles Collet   Encounter Date: 06/03/2018  End of Session - 06/03/18 1017    Visit Number  5    Number of Visits  17    Date for SLP Re-Evaluation  07/09/18    Authorization - Visit Number  5    Authorization - Number of Visits  30    SLP Start Time  6644    SLP Stop Time   1012    SLP Time Calculation (min)  41 min    Activity Tolerance  Patient tolerated treatment well       Past Medical History:  Diagnosis Date  . Cervical lymphadenopathy    right - followed my ENT  . Dystonia 1994   diagnosed in [place]  . Non Hodgkin's lymphoma ([place]) 2007   diffuse- 6 cycles of chemo with R CHOP Rituxan - last dose 10/08  . Parkinson disease ([place])    2015    Past Surgical History:  Procedure Laterality Date  . CHOLECYSTECTOMY, LAPAROSCOPIC    . EYE SURGERY     x2 as child  . PORT-A-CATH REMOVAL    . PORTACATH PLACEMENT    . SPLENECTOMY    . TONSILLECTOMY      There were no vitals filed for this visit.  Subjective Assessment - 06/03/18 0936    Subjective  "Improving" re: talking            ADULT SLP TREATMENT - 06/03/18 0938      General Information   Behavior/Cognition  Alert;Cooperative      Treatment Provided   Treatment provided  Cognitive-Linquistic      Pain Assessment   Pain Assessment  No/denies pain      Cognitive-Linquistic Treatment   Treatment focused on  Dysarthria    Skilled Treatment  Pt reports his spouse told him he was talking louder and pt reports fewer "What?" questions from his listeners. He also states he as improved stamina for volume and can now hear when he is talking in a normal volume. Pt spoke in simple conversation with average dB in low 70's.  Walking outside of clinic in noisy environment pt was audible and intelligible over 15 minute walk  supervision cues and occasional min A to swing his arms.       Assessment / Recommendations / Plan   Plan  Continue with current plan of care      Progression Toward Goals   Progression toward goals  Progressing toward goals       SLP Education - 06/03/18 1014    Education Details  energy conservation, continue HEP, be aware of background noise and project over it    [employer]) Educated  Patient    Methods  Explanation;Demonstration    Comprehension  Verbalized understanding;Returned demonstration;Need further instruction       SLP Short Term Goals - 06/03/18 1016      SLP SHORT TERM GOAL #1   Title  Pt will achieve 90dB average on loud /a/ over 3 sessions    Baseline  05/31/18    Time  3    Period  Weeks    Status  On-going      SLP SHORT TERM GOAL #2   Title  Pt will mainitain average  of 70dB during structured speech tasks and sentence level utteraces with occasional minc cues over two sessions    Time  3    Period  Weeks    Status  On-going      SLP SHORT TERM GOAL #3   Title  In 7 minutes simple conversation pt will achieve average speech volume of low 70s dB over two sessions     Time  3    Period  Weeks    Status  On-going       SLP Long Term Goals - 06/03/18 1017      SLP LONG TERM GOAL #1   Title  Pt will maintain average of 70dB over 12 minute simple conversation with rare min A over three sessions    Time  7    Period  Weeks    Status  On-going      SLP LONG TERM GOAL #2   Title  Pt will be audible over 15 minute conversation in noisy environment/outside of therapy room with min rare A x 2 sessions    Time  7    Period  Weeks    Status  On-going      SLP LONG TERM GOAL #3   Title  Pt will report less frequent requests to repeat himself outside of [place] room over 2 visits.    Time  7    Period  Weeks    Status  On-going       Plan - 06/03/18 1015     Clinical Impression Statement  [name] presents today with sub WNL vocal intensity due to hypokinetic dysarthria. Pt was highly stimulable for loud /a/ and subsequently louder speech in phrase level tasks. Maintained avg dB in low 70s for simple conversation with supervision cues. Pt remained audible/intelligible  while walking outside of clinic in noisy environment.  I recommend skilled ST in order to improve speech intelligibility and pt's QOL. Will monitor cognition, assess and add goals as needed.    Speech Therapy Frequency  2x / week    Treatment/Interventions  Cognitive reorganization;Cueing hierarchy;Compensatory techniques;Internal/external aids;Functional tasks;SLP instruction and feedback;Patient/family education;Other (comment)    SLP Home Exercise Plan  loud /a/, everyday phrases provided       Patient will benefit from skilled therapeutic intervention in order to improve the following deficits and impairments:   Cognitive communication deficit    Problem List Patient Active Problem List   Diagnosis Date Noted  . 10 year risk of MI or stroke 7.5% or greater 04/12/2018  . 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 Dysphagia, neurologic 07/27/2014  . Parkinson disease ([place]) 07/27/2014  . Dystonia 07/10/2014  . Low back pain 11/21/2011  . TRANSAMINASES, SERUM, ELEVATED 07/06/2008  . DIARRHEA 05/23/2008  . LYMPH NODE-ENLARGED 07/28/2007  . Non-Hodgkin lymphoma ([place]) 03/23/2007    [name], [name] MS,, CCC-SLP 06/03/2018, 10:17 AM  [facility] [address] [place], [place], [address] Phone: [phone]   Fax:  [phone]   Name: [name] [name] MRN: [id number] Date of Birth: Sep 07, 1951

## 2018-06-03 NOTE — Patient Instructions (Signed)
  Continue loud "AH!" 5x a day  Read aloud 5 minutes after focusing on loud volume  Continue practicing loud conversation daily   Pay attention to the background noise and talk over it - talk over the jets in [place] :)  You are doing a great job!! - Think about your breathing and use a big breath to power your voice - especially when you are tired.  Use energy conservation if you have to talk at night at a group dinner, party, meeting, wedding etc - rest up during the day to "save up" for your talking and walking later at night  Keep up the good work listening to your volume and being aware of your volume

## 2018-06-07 ENCOUNTER — Ambulatory Visit: Payer: Medicare Other | Admitting: Speech Pathology

## 2018-06-07 ENCOUNTER — Ambulatory Visit: Payer: Medicare Other | Admitting: Occupational Therapy

## 2018-06-07 DIAGNOSIS — R471 Dysarthria and anarthria: Secondary | ICD-10-CM | POA: Diagnosis not present

## 2018-06-07 DIAGNOSIS — R41840 Attention and concentration deficit: Secondary | ICD-10-CM | POA: Diagnosis not present

## 2018-06-07 DIAGNOSIS — R2681 Unsteadiness on feet: Secondary | ICD-10-CM

## 2018-06-07 DIAGNOSIS — R293 Abnormal posture: Secondary | ICD-10-CM | POA: Diagnosis not present

## 2018-06-07 DIAGNOSIS — R278 Other lack of coordination: Secondary | ICD-10-CM | POA: Diagnosis not present

## 2018-06-07 DIAGNOSIS — R29818 Other symptoms and signs involving the nervous system: Secondary | ICD-10-CM

## 2018-06-07 NOTE — Therapy (Deleted)
[facility] [address] [place], [place], [address] Phone: [phone]   Fax:  [phone]  Patient Details  Name: SHAYDEN BOBIER MRN: [id number] Date of Birth: 1951-06-28 Referring Provider:  Ludwig Clarks, DO  Encounter Date: 06/07/2018   Theone Murdoch 06/07/2018, 1:09 PM  [facility] [address] [place], [place], [address] Phone: [phone]   Fax:  [phone]

## 2018-06-07 NOTE — Therapy (Signed)
[facility] [address] [place], [place], [address] Phone: [phone]   Fax:  [phone]  Occupational Therapy Treatment  Patient Details  Name: [name] [name] MRN: [id number] Date of Birth: [birth year] Referring Provider: Dr. Carles Collet   Encounter Date: 06/07/2018  OT End of Session - 06/07/18 1016    Visit Number  7    Number of Visits  17    Date for OT Re-Evaluation  08/02/18    Authorization Type  [employer] Medicare     Authorization Time Period  POC 8 weeks, auth period 90 days    Authorization - Visit Number  7    Authorization - Number of Visits  10       Past Medical History:  Diagnosis Date  . Cervical lymphadenopathy    right - followed my ENT  . Dystonia 1994   diagnosed in [place]  . Non Hodgkin's lymphoma ([place]) 2007   diffuse- 6 cycles of chemo with R CHOP Rituxan - last dose 10/08  . Parkinson disease ([place])    2015    Past Surgical History:  Procedure Laterality Date  . CHOLECYSTECTOMY, LAPAROSCOPIC    . EYE SURGERY     x2 as child  . PORT-A-CATH REMOVAL    . PORTACATH PLACEMENT    . SPLENECTOMY    . TONSILLECTOMY      There were no vitals filed for this visit.  Subjective Assessment - 06/07/18 1015    Pertinent History  non-hodgkin lymphoma with hx of chemo, dystonia, Meige syndrome    Patient Stated Goals  improve handwriting, improve ease with shaving    Currently in Pain?  No/denies             Treatment: PWR! Hands basic 4 10 reps each. Pt practiced writing and demonstrates ability to write short paragraph with 90% or better legibility. Therapist checked progress towards short term goals. Dynamic step and reach, min v.c for larger amplitude movements to place items on target.              OT Education - 06/07/18 1610    Education Details  community resources, handwriting strategies    Person(s) Educated  Patient    Methods  Explanation;Demonstration;Verbal  cues;Handout    Comprehension  Verbalized understanding;Returned demonstration       OT Short Term Goals - 06/07/18 1019      OT SHORT TERM GOAL #1   Title  Pt will be independent with updated PD specific HEP--check STG's 06/03/18    Status  Achieved      OT SHORT TERM GOAL #2   Title  Pt will be able to write at least 2 sentences with at least 75% legibility.    Status  Achieved      OT SHORT TERM GOAL #3   Title  Pt will demonstrate increased LUE functional use as evidenced by increasing box/ blocks score by 4 blocks.    Status  On-going   46  blocks     OT SHORT TERM GOAL #4   Title  Pt will verbalize understanding of ways to prevent future complications and appropriate community resources.    Status  On-going        OT Long Term Goals - 05/04/18 1143      OT LONG TERM GOAL #1   Title  Pt will verbalize understanding of large amplitude movement strategies to incr ease/efficiency with ADLs/IADLs.--check LTGs 07/02/18    Time  8  Period  Weeks    Status  New      OT LONG TERM GOAL #2   Title  Pt will improve ability to fasten buttons as evidenced by improving 3 button/ unbutton time to 38 secs or less    Baseline  42.79 secs    Time  8    Period  Weeks    Status  New      OT LONG TERM GOAL #3   Title  Pt will be able to write at least 3 sentences with at least 90% legibility.    Baseline  25%-50%    Time  8    Period  Weeks    Status  New      OT LONG TERM GOAL #4   Title  Pt will demonstrate ability to retrieve lightweight object at 120 shoulder flexion with LUE.    Baseline  110 shoulder flexion    Time  8    Period  Weeks    Status  New            Plan - 06/07/18 1301    Clinical Impression Statement  Pt is progressing towards goals. He verbalizes understanding of PD specific community resources.     Rehab Potential  Good    OT Frequency  2x / week    OT Duration  8 weeks    OT Treatment/Interventions  Self-care/ADL training;Therapeutic  exercise;Visual/perceptual remediation/compensation;Patient/family education;Moist Heat;Paraffin;Neuromuscular education;Fluidtherapy;Therapeutic activities;Balance training;Passive range of motion;Manual Therapy;DME and/or AE instruction;Ultrasound;Contrast Bath;Cognitive remediation/compensation;Cryotherapy;Aquatic Therapy    Plan  ADL strategies, continue to address large amplitude movements with functional activity- anticipate d/c in the next several visits.    Consulted and Agree with Plan of Care  Patient       Patient will benefit from skilled therapeutic intervention in order to improve the following deficits and impairments:  Abnormal gait, Decreased cognition, Decreased knowledge of use of DME, Impaired flexibility, Impaired vision/preception, Decreased coordination, Decreased mobility, Decreased strength, Decreased range of motion, Decreased endurance, Decreased balance, Decreased knowledge of precautions, Decreased safety awareness, Difficulty walking, Impaired perceived functional ability, Impaired UE functional use  Visit Diagnosis: Other lack of coordination  Other symptoms and signs involving the nervous system  Abnormal posture  Attention and concentration deficit  Unsteadiness on feet    Problem List Patient Active Problem List   Diagnosis Date Noted  . 10 year risk of MI or stroke 7.5% or greater 04/12/2018  . 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 Dysphagia, neurologic 07/27/2014  . Parkinson disease ([place]) 07/27/2014  . Dystonia 07/10/2014  . Low back pain 11/21/2011  . TRANSAMINASES, SERUM, ELEVATED 07/06/2008  . DIARRHEA 05/23/2008  . LYMPH NODE-ENLARGED 07/28/2007  . Non-Hodgkin lymphoma ([place]) 03/23/2007    [name],[name] 06/07/2018, 4:11 PM  [facility] [address] [place], [place], [address] Phone: [phone]   Fax:   [phone]  Name: [name] [name] MRN: [id number] Date of Birth: [birth year]

## 2018-06-07 NOTE — Therapy (Signed)
[facility] [address] [place], [place], [address] Phone: [phone]   Fax:  [phone]  Speech Language Pathology Treatment  Patient Details  Name: [name] [name] MRN: [id number] Date of Birth: 1951/08/28 Referring Provider: Dr. Carles Collet   Encounter Date: 06/07/2018  End of Session - 06/07/18 1247    Visit Number  6    Number of Visits  17    Date for SLP Re-Evaluation  07/09/18    Authorization Type  30 visits ST, 0 used    Authorization - Visit Number  6    Authorization - Number of Visits  30    SLP Start Time  [phone]    SLP Stop Time   2993    SLP Time Calculation (min)  43 min    Activity Tolerance  Patient tolerated treatment well       Past Medical History:  Diagnosis Date  . Cervical lymphadenopathy    right - followed my ENT  . Dystonia 1994   diagnosed in [place]  . Non Hodgkin's lymphoma ([place]) 2007   diffuse- 6 cycles of chemo with R CHOP Rituxan - last dose 10/08  . Parkinson disease ([place])    2015    Past Surgical History:  Procedure Laterality Date  . CHOLECYSTECTOMY, LAPAROSCOPIC    . EYE SURGERY     x2 as child  . PORT-A-CATH REMOVAL    . PORTACATH PLACEMENT    . SPLENECTOMY    . TONSILLECTOMY      There were no vitals filed for this visit.  Subjective Assessment - 06/07/18 0939    Subjective  "It was really good. I was nice and loud actually."    Currently in Pain?  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 Intensity in 18 minutes simple-mod complex conversation  averaged in low 70s dB; pt aware of periods of lower intensity and self-corrects ~90% of the time. As conversation progressed, pt reported mild fatigue.        Assessment / Recommendations / Plan   Plan  Continue with current plan of care      Progression Toward Goals   Progression toward goals  Progressing toward goals         SLP Short Term Goals - 06/07/18 1004      SLP SHORT TERM GOAL #1   Title  Pt will achieve 90dB average on loud /a/ over 3 sessions    Baseline  05/31/18, 06/07/18    Time  2    Period  Weeks    Status  On-going      SLP SHORT TERM GOAL #2   Title  Pt will mainitain average of 70dB during structured speech tasks and sentence level utteraces with occasional minc cues over two sessions    Baseline  06/07/18    Time  2    Period  Weeks    Status  On-going      SLP SHORT TERM GOAL #3  Title  In 7 minutes simple conversation pt will achieve average speech volume of low 70s dB over two sessions     Baseline  06/07/18    Time  2    Period  Weeks    Status  On-going       SLP Long Term Goals - 06/07/18 1004      SLP LONG TERM GOAL #1   Title  Pt will maintain average of 70dB over 12 minute simple conversation with rare min A over three sessions    Baseline  06/07/18    Time  6    Period  Weeks    Status  On-going      SLP LONG TERM GOAL #2   Title  Pt will be audible over 15 minute conversation in noisy environment/outside of therapy room with min rare A x 2 sessions    Time  6    Period  Weeks    Status  On-going      SLP LONG TERM GOAL #3   Title  Pt will report less frequent requests to repeat himself outside of [facility] room over 2 visits.    Baseline  06/07/18    Time  6    Period  Weeks    Status  On-going       Plan - 06/07/18 1248    Clinical Impression Statement  Mr. Gerrard presents today with sub WNL vocal intensity due to hypokinetic dysarthria. Pt is making good progress and demonstrating generalization of vocal intensity from structured  speech tasks to conversation (18 minutes today). Maintained avg dB in low 70s for simple conversation with supervision cues. I recommend skilled ST in order to improve speech intelligibility and pt's QOL. Will monitor cognition, assess and add goals as needed.    Speech Therapy Frequency  2x / week    Treatment/Interventions  Cognitive reorganization;Cueing hierarchy;Compensatory techniques;Internal/external aids;Functional tasks;SLP instruction and feedback;Patient/family education;Other (comment)    Potential to Achieve Goals  Good       Patient will benefit from skilled therapeutic intervention in order to improve the following deficits and impairments:   Dysarthria and anarthria    Problem List Patient Active Problem List   Diagnosis Date Noted  . 10 year risk of MI or stroke 7.5% or greater 04/12/2018  . 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 Dysphagia, neurologic 07/27/2014  . Parkinson disease ([place]) 07/27/2014  . Dystonia 07/10/2014  . Low back pain 11/21/2011  . TRANSAMINASES, SERUM, ELEVATED 07/06/2008  . DIARRHEA 05/23/2008  . LYMPH NODE-ENLARGED 07/28/2007  . Non-Hodgkin lymphoma ([place]) 03/23/2007   Deneise Lever, [place], [place] Speech-Language Pathologist  Aliene Altes 06/07/2018, 12:52 PM  [facility] [address] [place], [place], [address] Phone: [phone]   Fax:  [phone]   Name: JEMARIO POITRAS MRN: [id number] Date of Birth: 09-Feb-1951

## 2018-06-09 ENCOUNTER — Ambulatory Visit: Payer: Medicare Other | Admitting: Occupational Therapy

## 2018-06-09 ENCOUNTER — Ambulatory Visit: Payer: Medicare Other

## 2018-06-09 DIAGNOSIS — R2681 Unsteadiness on feet: Secondary | ICD-10-CM

## 2018-06-09 DIAGNOSIS — R471 Dysarthria and anarthria: Secondary | ICD-10-CM

## 2018-06-09 DIAGNOSIS — R278 Other lack of coordination: Secondary | ICD-10-CM

## 2018-06-09 DIAGNOSIS — R29818 Other symptoms and signs involving the nervous system: Secondary | ICD-10-CM

## 2018-06-09 DIAGNOSIS — R41841 Cognitive communication deficit: Secondary | ICD-10-CM

## 2018-06-09 DIAGNOSIS — R293 Abnormal posture: Secondary | ICD-10-CM

## 2018-06-09 DIAGNOSIS — R1312 Dysphagia, oropharyngeal phase: Secondary | ICD-10-CM

## 2018-06-09 DIAGNOSIS — R41840 Attention and concentration deficit: Secondary | ICD-10-CM

## 2018-06-09 NOTE — Therapy (Signed)
[facility] [address] [place], [place], [address] Phone: [phone]   Fax:  [phone]  Speech Language Pathology Treatment/ Discharge  Patient Details  Name: [name] [name] MRN: [id number] Date of Birth: 1950-10-02 Referring Provider: Dr. Carles Collet   Encounter Date: 06/09/2018  End of Session - 06/09/18 0945    Visit Number  7    Number of Visits  17    Date for SLP Re-Evaluation  07/09/18    Authorization - Visit Number  7    Authorization - Number of Visits  30    SLP Start Time  6962    SLP Stop Time   0931    SLP Time Calculation (min)  42 min    Activity Tolerance  Patient tolerated treatment well       Past Medical History:  Diagnosis Date  . Cervical lymphadenopathy    right - followed my ENT  . Dystonia 1994   diagnosed in [place]  . Non Hodgkin's lymphoma ([place]) 2007   diffuse- 6 cycles of chemo with R CHOP Rituxan - last dose 10/08  . Parkinson disease ([place])    2015    Past Surgical History:  Procedure Laterality Date  . CHOLECYSTECTOMY, LAPAROSCOPIC    . EYE SURGERY     x2 as child  . PORT-A-CATH REMOVAL    . PORTACATH PLACEMENT    . SPLENECTOMY    . TONSILLECTOMY      There were no vitals filed for this visit.  Subjective Assessment - 06/09/18 0901    Subjective  "If something is particularly complex I don't like to be interrupted anymore. Pt gave example of not being able to focus on his wife talking to him while he is trying to change settings on her computer."    Currently in Pain?  No/denies            ADULT SLP TREATMENT - 06/09/18 0904      General Information   Behavior/Cognition  Alert;Cooperative;Pleasant mood      Treatment Provided   Treatment provided  Cognitive-Linquistic      Cognitive-Linquistic Treatment   Treatment focused on  Dysarthria;Patient/family/caregiver education    Skilled Treatment  Mod complex-complex conversation was WNL volume today in ST  room over 25 minutes. Pt remarked "I know when I'm softer now and can react accordingly. I didn't know that before." and, "That's a breakthrough for me." SLP and pt also discussed linguistic divided attention tasks pt can perform at home and pt demonstrated understanding of these tasks to SLP in the context of the discussion.       Assessment / Recommendations / Plan   Plan  Continue with current plan of care      Progression Toward Goals   Progression toward goals  Progressing toward goals       SLP Education - 06/09/18 0945    Education Details  divided attention tasks at home, need to cont loud /a/    Person(s) Educated  Patient    Methods  Explanation    Comprehension  Verbalized understanding       SLP Short Term Goals - 06/09/18 0948      SLP SHORT TERM GOAL #1   Title  Pt will achieve 90dB average on loud /a/ over 3 sessions    Status  Partially Met      SLP SHORT TERM GOAL #2   Title  Pt will mainitain average of 70dB during structured speech  tasks and sentence level utteraces with occasional minc cues over two sessions    Status  Achieved      SLP SHORT TERM GOAL #3   Title  In 7 minutes simple conversation pt will achieve average speech volume of low 70s dB over two sessions     Status  Achieved       SLP Long Term Goals - 06/09/18 0948      SLP LONG TERM GOAL #1   Title  Pt will maintain average of 70dB over 12 minute simple conversation with rare min A over three sessions    Status  Partially Met      SLP LONG TERM GOAL #2   Title  Pt will be audible over 15 minute conversation in noisy environment/outside of therapy room with min rare A x 2 sessions    Status  Achieved      SLP LONG TERM GOAL #3   Title  Pt will report less frequent requests to repeat himself outside of [facility] room over 2 visits.    Status  Partially Met       Plan - 06/09/18 0946    Clinical Impression Statement  Pt wrapped up OT today, inquires about d/c in ST. He maintained avg dB in low  70s for mod complex/complex conversation for 25 minutes. I recommend d/c of skilled ST based upon pt success and also pt request. Pt will complete linguistic-based divided attention tasks at home, regularly.    Treatment/Interventions  Cognitive reorganization;Cueing hierarchy;Compensatory techniques;Internal/external aids;Functional tasks;SLP instruction and feedback;Patient/family education;Other (comment)    Potential to Achieve Goals  Good       Patient will benefit from skilled therapeutic intervention in order to improve the following deficits and impairments:   Dysarthria and anarthria  Cognitive communication deficit  Oropharyngeal dysphagia   SPEECH THERAPY DISCHARGE SUMMARY  Visits from Start of Care: 7  Current functional level related to goals / functional outcomes: Pt spoke with average low 70s dB in 25 minutes mod complex-complex conversation today, improving from previous session of simple conversation for 18 minutes with WNL speech volume. Pt has met or partially met all LTGs. OT d/c'd today and pt inquired about possibility of ST also d/c'ing today.   Remaining deficits: Pt with intermittent dysarthria most notable when fatigued. Pt reports decr'd ability in multitasking situations.   Education / Equipment: Home tasks for multitasking, need to cont to perform loud /a/ at home.  Plan: Patient agrees to discharge.  Patient goals were partially met. Patient is being discharged due to meeting the stated rehab goals.  ?????and pt request.       Problem List Patient Active Problem List   Diagnosis Date Noted  . 10 year risk of MI or stroke 7.5% or greater 04/12/2018  . 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 Dysphagia, neurologic 07/27/2014  . Parkinson disease ([facility]) 07/27/2014  . Dystonia 07/10/2014  . Low back pain 11/21/2011  . TRANSAMINASES, SERUM, ELEVATED 07/06/2008  . DIARRHEA 05/23/2008  .  LYMPH NODE-ENLARGED 07/28/2007  . Non-Hodgkin lymphoma ([place]) 03/23/2007    [facility] 06/09/2018, 9:49 AM  [facility] [address] [place], [place], [address] Phone: [phone]   Fax:  [phone]   Name: [name] [name] MRN: [id number] Date of Birth: Oct 01, 1950

## 2018-06-09 NOTE — Patient Instructions (Signed)
Do something with divided attention 5 days/week.  Auditory - written  Auditory - auditory  Written - speaking  Auditory - speaking  Do loud "ah" every day.  We will see you back in about 6 months for a screening with PT, OT, and speech.

## 2018-06-09 NOTE — Patient Instructions (Signed)
To fasten buttons, flick fingers out, grasp button firmly and line it up with the hole, push the button through the hole with a big movement To unbutton tug the hole open with one hand and firmly push button through the hole  Ways to prevent future Parkinson's related complications: 1.   Exercise regularly,  2.   Focus on BIGGER movements during daily activities- really reach overhead, straighten elbows and extend fingers 3.   When dressing or reaching for your seatbelt make sure to use your body to assist by twisting while you reach-this can help to minimize stress on the shoulder and reduce the risk of a rotator cuff tear 4.   Swing your arms when you walk! People with PD are at increased risk for frozen shoulder and swinging your arms can reduce this risk.

## 2018-06-10 NOTE — Therapy (Signed)
[facility] [address] [place], [place], [address] Phone: [phone]   Fax:  [phone]  Occupational Therapy Treatment  Patient Details  Name: [name] [name] MRN: [id number] Date of Birth: 1950-11-04 Referring Provider: Dr. Carles Collet   Encounter Date: 06/09/2018  OT End of Session - 06/10/18 0910    Visit Number  8    Number of Visits  17    Date for OT Re-Evaluation  08/02/18    Authorization Type  [employer]     Authorization Time Period  POC 8 weeks, auth period 90 days    Authorization - Visit Number  8    Authorization - Number of Visits  10    OT Start Time  [phone]    OT Stop Time  0845    OT Time Calculation (min)  39 min    Activity Tolerance  Patient tolerated treatment well    Behavior During Therapy  [facility] for tasks assessed/performed       Past Medical History:  Diagnosis Date  . Cervical lymphadenopathy    right - followed my ENT  . Dystonia 1994   diagnosed in [place]  . Non Hodgkin's lymphoma ([place]) 2007   diffuse- 6 cycles of chemo with R CHOP Rituxan - last dose 10/08  . Parkinson disease ([place])    2015    Past Surgical History:  Procedure Laterality Date  . CHOLECYSTECTOMY, LAPAROSCOPIC    . EYE SURGERY     x2 as child  . PORT-A-CATH REMOVAL    . PORTACATH PLACEMENT    . SPLENECTOMY    . TONSILLECTOMY      There were no vitals filed for this visit.  Subjective Assessment - 06/10/18 0909    Subjective   Pt agrees with plans for d/c    Pertinent History  non-hodgkin lymphoma with hx of chemo, dystonia, Meige syndrome    Patient Stated Goals  improve handwriting, improve ease with shaving    Currently in Pain?  No/denies         Treatment: PWR! Hands basic 4, 10 reps each Dynamic step and reach to left and right sides while following written instructions for dual tasking , min v.c for larger amplitude movements Arm bike x 5 mins level 1 for conditioning, pt maintained 40  rpm Therapist checked progress towards goals. Pt agrees with plans for d/c, and return screen in 6 mons               OT Education - 06/10/18 0914    Education Details  ways to prevent future PD related complications, adpted strategy for fastening buttons.    Person(s) Educated  Patient    Methods  Explanation;Demonstration;Verbal cues;Handout    Comprehension  Verbalized understanding;Returned demonstration       OT Short Term Goals - 06/09/18 0811      OT SHORT TERM GOAL #1   Title  Pt will be independent with updated PD specific HEP--check STG's 06/03/18    Status  Achieved      OT SHORT TERM GOAL #2   Title  Pt will be able to write at least 2 sentences with at least 75% legibility.    Status  Achieved      OT SHORT TERM GOAL #3   Title  Pt will demonstrate increased LUE functional use as evidenced by increasing box/ blocks score by 4 blocks.    Status  Not Met   47  blocks  OT SHORT TERM GOAL #4   Title  Pt will verbalize understanding of ways to prevent future complications and appropriate community resources.    Status  Achieved        OT Long Term Goals - 06/09/18 6789      OT LONG TERM GOAL #1   Title  Pt will verbalize understanding of large amplitude movement strategies to incr ease/efficiency with ADLs/IADLs.--check LTGs 07/02/18    Time  8    Period  Weeks    Status  Achieved      OT LONG TERM GOAL #2   Title  Pt will improve ability to fasten buttons as evidenced by improving 3 button/ unbutton time to 38 secs or less    Baseline  42.79 secs    Time  8    Period  Weeks    Status  Not Met   48.25 sec     OT LONG TERM GOAL #3   Title  Pt will be able to write at least 3 sentences with at least 90% legibility.    Baseline  25%-50%    Time  8    Period  Weeks    Status  Achieved      OT LONG TERM GOAL #4   Title  Pt will demonstrate ability to retrieve lightweight object at 120 shoulder flexion with LUE.    Baseline  110 shoulder  flexion    Time  8    Period  Weeks    Status  Achieved   125*           Plan - 06/10/18 0910    Clinical Impression Statement  Pt demonstrates good overall progress. He did not fully achieve goals but he reports understanding of strategies to maximize independence. Pt is aware of community resources.    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 Pt lives with his wife who has MS. PMH: Non Hodgkin's lymphoma, L eye legallly blind, Meige syndrome, low back pain    Occupational performance deficits (Please refer to evaluation for details):  ADL's;IADL's;Leisure;Social Participation;Rest and Sleep    Rehab Potential  Good    OT Frequency  2x / week    OT Duration  8 weeks    OT Treatment/Interventions  Self-care/ADL training;Therapeutic exercise;Visual/perceptual remediation/compensation;Patient/family education;Moist Heat;Paraffin;Neuromuscular education;Fluidtherapy;Therapeutic activities;Balance training;Passive range of motion;Manual Therapy;DME and/or AE instruction;Ultrasound;Contrast Bath;Cognitive remediation/compensation;Cryotherapy;Aquatic Therapy    Plan  d/c OT, screen in 6 mons    Consulted and Agree with Plan of Care  Patient       Patient will benefit from skilled therapeutic intervention in order to improve the following deficits and impairments:  Abnormal gait, Decreased cognition, Decreased knowledge of use of DME, Impaired flexibility, Impaired vision/preception, Decreased coordination, Decreased mobility, Decreased strength, Decreased range of motion, Decreased endurance, Decreased balance, Decreased knowledge of precautions, Decreased safety awareness, Difficulty walking, Impaired perceived functional ability, Impaired UE functional use  Visit Diagnosis: Other lack of coordination  Other symptoms and signs  involving the nervous system  Abnormal posture  Attention and concentration deficit  Unsteadiness on feet    Problem List Patient Active Problem List   Diagnosis Date Noted  . 10 year risk of MI or stroke 7.5% or greater 04/12/2018  . 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 Dysphagia, neurologic 07/27/2014  . Parkinson  disease ([place]) 07/27/2014  . Dystonia 07/10/2014  . Low back pain 11/21/2011  . TRANSAMINASES, SERUM, ELEVATED 07/06/2008  . DIARRHEA 05/23/2008  . LYMPH NODE-ENLARGED 07/28/2007  . Non-Hodgkin lymphoma ([place]) 03/23/2007    RINE,KATHRYN 06/10/2018, 9:17 AM  [facility] [address] [place], [place], [address] Phone: [phone]   Fax:  [phone]  Name: [name] [name] MRN: [id number] Date of Birth: 1951-06-10

## 2018-06-10 NOTE — Therapy (Signed)
[facility] [address] [place], [place], [address] Phone: [phone]   Fax:  [phone]  Occupational Therapy Treatment  Patient Details  Name: [name] [name] MRN: [id number] Date of Birth: 1951/04/09 Referring Provider: Dr. Carles Collet   Encounter Date: 06/09/2018  OT End of Session - 06/10/18 0910    Visit Number  8    Number of Visits  17    Date for OT Re-Evaluation  08/02/18    Authorization Type  [employer] Medicare     Authorization Time Period  POC 8 weeks, auth period 90 days    Authorization - Visit Number  8    Authorization - Number of Visits  10    OT Start Time  [id number]    OT Stop Time  0845    OT Time Calculation (min)  39 min    Activity Tolerance  Patient tolerated treatment well    Behavior During Therapy  [facility] for tasks assessed/performed       Past Medical History:  Diagnosis Date  . Cervical lymphadenopathy    right - followed my ENT  . Dystonia 1994   diagnosed in [place]  . Non Hodgkin's lymphoma ([place]) 2007   diffuse- 6 cycles of chemo with R CHOP Rituxan - last dose 10/08  . Parkinson disease ([place])    2015    Past Surgical History:  Procedure Laterality Date  . CHOLECYSTECTOMY, LAPAROSCOPIC    . EYE SURGERY     x2 as child  . PORT-A-CATH REMOVAL    . PORTACATH PLACEMENT    . SPLENECTOMY    . TONSILLECTOMY      There were no vitals filed for this visit.  Subjective Assessment - 06/10/18 0909    Subjective   Pt agrees with plans for d/c    Pertinent History  non-hodgkin lymphoma with hx of chemo, dystonia, Meige syndrome    Patient Stated Goals  improve handwriting, improve ease with shaving    Currently in Pain?  No/denies                           OT Education - 06/10/18 0914    Education Details  ways to prevent future PD related complications, adpted strategy for fastening buttons.    Person(s) Educated  Patient    Methods  Explanation;Demonstration;Verbal  cues;Handout    Comprehension  Verbalized understanding;Returned demonstration       OT Short Term Goals - 06/09/18 0811      OT SHORT TERM GOAL #1   Title  Pt will be independent with updated PD specific HEP--check STG's 06/03/18    Status  Achieved      OT SHORT TERM GOAL #2   Title  Pt will be able to write at least 2 sentences with at least 75% legibility.    Status  Achieved      OT SHORT TERM GOAL #3   Title  Pt will demonstrate increased LUE functional use as evidenced by increasing box/ blocks score by 4 blocks.    Status  Not Met   47  blocks     OT SHORT TERM GOAL #4   Title  Pt will verbalize understanding of ways to prevent future complications and appropriate community resources.    Status  Achieved        OT Long Term Goals - 06/09/18 0811      OT LONG TERM GOAL #1  Title  Pt will verbalize understanding of large amplitude movement strategies to incr ease/efficiency with ADLs/IADLs.--check LTGs 07/02/18    Time  8    Period  Weeks    Status  Achieved      OT LONG TERM GOAL #2   Title  Pt will improve ability to fasten buttons as evidenced by improving 3 button/ unbutton time to 38 secs or less    Baseline  42.79 secs    Time  8    Period  Weeks    Status  Not Met   48.25 sec     OT LONG TERM GOAL #3   Title  Pt will be able to write at least 3 sentences with at least 90% legibility.    Baseline  25%-50%    Time  8    Period  Weeks    Status  Achieved      OT LONG TERM GOAL #4   Title  Pt will demonstrate ability to retrieve lightweight object at 120 shoulder flexion with LUE.    Baseline  110 shoulder flexion    Time  8    Period  Weeks    Status  Achieved   125*           Plan - 06/10/18 0910    Clinical Impression Statement  Pt demonstrates good overall progress. He did not fully achieve goals but he reports understanding of strategies to maximize independence. Pt is aware of community resources.    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 Pt lives with his wife who has MS. PMH: Non Hodgkin's lymphoma, L eye legallly blind, Meige syndrome, low back pain    Occupational performance deficits (Please refer to evaluation for details):  ADL's;IADL's;Leisure;Social Participation;Rest and Sleep    Rehab Potential  Good    OT Frequency  2x / week    OT Duration  8 weeks    OT Treatment/Interventions  Self-care/ADL training;Therapeutic exercise;Visual/perceptual remediation/compensation;Patient/family education;Moist Heat;Paraffin;Neuromuscular education;Fluidtherapy;Therapeutic activities;Balance training;Passive range of motion;Manual Therapy;DME and/or AE instruction;Ultrasound;Contrast Bath;Cognitive remediation/compensation;Cryotherapy;Aquatic Therapy    Plan  d/c OT, screen in 6 mons    Consulted and Agree with Plan of Care  Patient       Patient will benefit from skilled therapeutic intervention in order to improve the following deficits and impairments:  Abnormal gait, Decreased cognition, Decreased knowledge of use of DME, Impaired flexibility, Impaired vision/preception, Decreased coordination, Decreased mobility, Decreased strength, Decreased range of motion, Decreased endurance, Decreased balance, Decreased knowledge of precautions, Decreased safety awareness, Difficulty walking, Impaired perceived functional ability, Impaired UE functional use  Visit Diagnosis: Other lack of coordination  Other symptoms and signs involving the nervous system  Abnormal posture  Attention and concentration deficit  Unsteadiness on feet OCCUPATIONAL THERAPY DISCHARGE SUMMARY   Current functional level related to goals / functional outcomes: Pt demonstrates good overall progress. He did not fully achieve goals, however he feels ready for d/c and verbalizes understanding of  strategies.   Remaining deficits: Decreased coordination, decreased balance, abnormal posture, bradykinesia   Education / Equipment: Pt was educated regarding the following: PD specific HEP, ways to prevent future PD related complications, community resources, and adapted strategies for ADLs. Pt verbalizes understanding of all education.  Plan: Patient agrees to discharge.  Patient goals were partially met. Patient is being discharged due to being pleased with  the current functional level.  ?????       Problem List Patient Active Problem List   Diagnosis Date Noted  . 10 year risk of MI or stroke 7.5% or greater 04/12/2018  . 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 Dysphagia, neurologic 07/27/2014  . Parkinson disease ([place]) 07/27/2014  . Dystonia 07/10/2014  . Low back pain 11/21/2011  . TRANSAMINASES, SERUM, ELEVATED 07/06/2008  . DIARRHEA 05/23/2008  . LYMPH NODE-ENLARGED 07/28/2007  . Non-Hodgkin lymphoma ([place]) 03/23/2007  Theone Murdoch, OTR/L [phone] Phone: [phone] 9:20 AM 06/10/18  Saphira Lahmann 06/10/2018, 9:19 AM [facility] [address] [place], [place], [address] Phone: [phone]   Fax:  [phone]  Name: AMORI COLOMB MRN: [id number] Date of Birth: Jul 29, 1951

## 2018-06-15 ENCOUNTER — Encounter: Payer: Medicare Other | Admitting: Occupational Therapy

## 2018-06-15 ENCOUNTER — Encounter: Payer: Medicare Other | Admitting: Speech Pathology

## 2018-06-17 ENCOUNTER — Encounter: Payer: Medicare Other | Admitting: Occupational Therapy

## 2018-06-17 ENCOUNTER — Encounter: Payer: Medicare Other | Admitting: Speech Pathology

## 2018-06-22 ENCOUNTER — Encounter: Payer: Medicare Other | Admitting: Speech Pathology

## 2018-06-22 ENCOUNTER — Encounter: Payer: Medicare Other | Admitting: Occupational Therapy

## 2018-06-24 ENCOUNTER — Encounter: Payer: Medicare Other | Admitting: Occupational Therapy

## 2018-06-29 ENCOUNTER — Encounter: Payer: Medicare Other | Admitting: Occupational Therapy

## 2018-06-29 ENCOUNTER — Encounter: Payer: Medicare Other | Admitting: Speech Pathology

## 2018-07-01 ENCOUNTER — Encounter: Payer: Medicare Other | Admitting: Speech Pathology

## 2018-07-01 ENCOUNTER — Encounter: Payer: Medicare Other | Admitting: Occupational Therapy

## 2018-07-05 ENCOUNTER — Ambulatory Visit: Payer: Medicare Other | Admitting: Family Medicine

## 2018-07-06 ENCOUNTER — Encounter: Payer: Medicare Other | Admitting: Occupational Therapy

## 2018-07-08 ENCOUNTER — Encounter: Payer: Medicare Other | Admitting: Speech Pathology

## 2018-07-08 ENCOUNTER — Encounter: Payer: Medicare Other | Admitting: Occupational Therapy

## 2018-07-15 ENCOUNTER — Encounter: Payer: Self-pay | Admitting: Family Medicine

## 2018-07-15 ENCOUNTER — Ambulatory Visit (INDEPENDENT_AMBULATORY_CARE_PROVIDER_SITE_OTHER): Payer: Medicare Other | Admitting: Family Medicine

## 2018-07-15 VITALS — BP 118/80 | HR 71 | Temp 98.1°F | Ht 66.0 in | Wt 188.0 lb

## 2018-07-15 DIAGNOSIS — R0609 Other forms of dyspnea: Secondary | ICD-10-CM | POA: Diagnosis not present

## 2018-07-15 DIAGNOSIS — Z23 Encounter for immunization: Secondary | ICD-10-CM

## 2018-07-15 HISTORY — DX: Other forms of dyspnea: R06.09

## 2018-07-15 NOTE — Progress Notes (Signed)
Chief Complaint  Patient presents with  . Follow-up    Subjective: Patient is a 67 y.o. male here for f/u doe.  Patient has been evaluated several times for dyspnea upon exertion.  He failed a SABA.  It was thought physical deconditioning may be contributing to his shortness of breath, however he tried pushing himself physically more so over the past several weeks and noticed no improvement.  He does continue to gain weight, however is not coughing or having any lower extremity swelling.  No change in his diet.  He is not wheezing.   ROS: Heart: Denies chest pain  Lungs: as noted in HPI   Past Medical History:  Diagnosis Date  . Cervical lymphadenopathy    right - followed my ENT  . Dystonia 1994   diagnosed in [place]  . Non Hodgkin's lymphoma ([place]) 2007   diffuse- 6 cycles of chemo with R CHOP Rituxan - last dose 10/08  . Parkinson disease ([place])    2015    Objective: BP 118/80 (BP Location: Left Arm, Patient Position: Sitting, Cuff Size: Normal)   Pulse 71   Temp 98.1 F (36.7 C) (Oral)   Ht 5\' 6"  (1.676 m)   Wt 188 lb (85.3 kg)   SpO2 98%   BMI 30.34 kg/m  General: Awake, appears stated age HEENT: MMM, nares patent without polyps or edema Heart: RRR, no bruits, no lower extremity edema Lungs: CTAB, no rales, wheezes or rhonchi. No accessory muscle use Psych: Age appropriate judgment and insight, normal affect and mood  Assessment and Plan: DOE (dyspnea on exertion) - Plan: Ambulatory referral to Pulmonology, ECHOCARDIOGRAM COMPLETE  Need for influenza vaccination - Plan: Flu vaccine HIGH DOSE PF (Fluzone High dose)  Refer to Pulm.  Check echo. Follow-up with me as needed. The patient voiced understanding and agreement to the plan.  [place], DO 07/15/18  9:18 AM

## 2018-07-15 NOTE — Patient Instructions (Addendum)
If you do not hear anything about your referral in the next 1-2 weeks, call our office and ask for an update.  We will be in touch regarding your echo results. If anything needs to change, we will do it.  Let us know if you need anything.

## 2018-07-15 NOTE — Progress Notes (Signed)
Pre visit review using our clinic review tool, if applicable. No additional management support is needed unless otherwise documented below in the visit note. 

## 2018-07-23 ENCOUNTER — Ambulatory Visit (HOSPITAL_BASED_OUTPATIENT_CLINIC_OR_DEPARTMENT_OTHER)
Admission: RE | Admit: 2018-07-23 | Discharge: 2018-07-23 | Disposition: A | Discharge status: Home / Self Care | Payer: Medicare Other | Source: Ambulatory Visit | Attending: Family Medicine | Admitting: Family Medicine

## 2018-07-23 DIAGNOSIS — R0609 Other forms of dyspnea: Secondary | ICD-10-CM | POA: Diagnosis not present

## 2018-07-23 DIAGNOSIS — I351 Nonrheumatic aortic (valve) insufficiency: Secondary | ICD-10-CM | POA: Diagnosis not present

## 2018-07-23 NOTE — Progress Notes (Signed)
  Echocardiogram 2D Echocardiogram has been performed.  Daci Stubbe T Ruslan Mccabe 07/23/2018, 3:10 PM

## 2018-07-27 ENCOUNTER — Ambulatory Visit (INDEPENDENT_AMBULATORY_CARE_PROVIDER_SITE_OTHER): Payer: Medicare Other | Admitting: Pulmonary Disease

## 2018-07-27 ENCOUNTER — Encounter: Payer: Self-pay | Admitting: Pulmonary Disease

## 2018-07-27 VITALS — BP 121/81 | HR 71 | Ht 66.0 in | Wt 188.0 lb

## 2018-07-27 DIAGNOSIS — Z9081 Acquired absence of spleen: Secondary | ICD-10-CM

## 2018-07-27 DIAGNOSIS — R0602 Shortness of breath: Secondary | ICD-10-CM | POA: Diagnosis not present

## 2018-07-27 DIAGNOSIS — C8597 Non-Hodgkin lymphoma, unspecified, spleen: Secondary | ICD-10-CM

## 2018-07-27 DIAGNOSIS — G20 Parkinson's disease: Secondary | ICD-10-CM

## 2018-07-27 DIAGNOSIS — R0609 Other forms of dyspnea: Secondary | ICD-10-CM

## 2018-07-27 NOTE — Patient Instructions (Signed)
Shortness of breath: We will check your oxygen level while walking today We will check a lung function test to look for evidence of an underlying lung problem In general, I doubt there is a lung tissue abnormality I think he should exercise more, I would recommend that she start walking 10 to 15 minutes a day  If no improvement in shortness of breath follow-up with me in 3 months  Call me after the lung function test so I can send you the results

## 2018-07-27 NOTE — Progress Notes (Signed)
Synopsis: Referred in November 2019 for Dyspnea  Subjective:   PATIENT ID: [name] [name] GENDER: male DOB: [birth year], MRN: [id number]   HPI  Chief Complaint  Patient presents with  . Pulm Consult    Referred by Dr. Nani Ravens for dyspnea. Per patient, this has been going on for the past 6 months. States that over the last month, his SOB has decreased. Had an echo on 11/1. Notices the SOB with exertion.     This is a pleasant 67 y/o male with parkinson's disease and a history of non-Hodgkin's lymphoma treated in 2008 who comes to my clinic today for evaluation of shortness of breath.  He says that the symptoms developed about 6 months ago and he first noticed it when he was climbing flights of stairs.  He says that he has a three-story home and he frequently goes to the third floor and he noticed the somewhat abrupt onset of shortness of breath about 6 months ago.  He said that it did seem to escalate in severity over the following 2 to 3 months but then about 2 months ago it leveled off and has not worsened.  He says that he has found that he is capable of exercising through the shortness of breath without stopping necessarily.  He says that he typically notes it with "heavy exertion" which she characterizes as lifting and carrying heavy objects or climbing flights of stairs.  Walking on level ground and regular day-to-day activities do not cause too much shortness of breath.  Recently he moved a microwave in his home and he noted a profound amount of shortness of breath afterwards.  He said that during that episode alone he did feel a sensation of rattling or "buzzing" in his chest but with prior episodes he has not had notable wheeze or chest tightness.  He denies chest pain.  He has not noticed significant leg swelling.  He has never had a lung problem in the past.  He has never smoked cigarettes, he has always worked in an office environment.  He says that his mother had a history of lung  cancer after smoking for many years but no other siblings have lung problems.  He has a history of Parkinson's disease and follows with Dr. Carles Collet.  He has participated in exercise routines for this but he says they are not specifically geared towards building aerobic capacity or endurance.  He also has a past medical history significant for non-Hodgkin's lymphoma and was treated with a Rituxan-based regimen in 2008.  His last visit with oncology was in 2013 during which time he was felt to be disease-free.  Past Medical History:  Diagnosis Date  . Cervical lymphadenopathy    right - followed my ENT  . Dystonia 1994   diagnosed in [place]  . Non Hodgkin's lymphoma ([place]) 2007   diffuse- 6 cycles of chemo with R CHOP Rituxan - last dose 10/08  . Parkinson disease ([place])    2015     Family History  Problem Relation Age of Onset  . Heart disease Mother   . Cancer Mother        lung  . Heart disease Father      Social History   Socioeconomic History  . Marital status: Married    Spouse name: Not on file  . Number of children: Not on file  . Years of education: Not on file  . Highest education level: Not on file  Occupational History  .  Not on file  Social Needs  . Financial resource strain: Not on file  . Food insecurity:    Worry: Not on file    Inability: Not on file  . Transportation needs:    Medical: Not on file    Non-medical: Not on file  Tobacco Use  . Smoking status: Never Smoker  . Smokeless tobacco: Never Used  Substance and Sexual Activity  . Alcohol use: No    Alcohol/week: 0.0 standard drinks  . Drug use: No  . Sexual activity: Not on file  Lifestyle  . Physical activity:    Days per week: Not on file    Minutes per session: Not on file  . Stress: Not on file  Relationships  . Social connections:    Talks on phone: Not on file    Gets together: Not on file    Attends religious service: Not on file    Active member of club or organization: Not on file      Attends meetings of clubs or organizations: Not on file    Relationship status: Not on file  . Intimate partner violence:    Fear of current or ex partner: Not on file    Emotionally abused: Not on file    Physically abused: Not on file    Forced sexual activity: Not on file  Other Topics Concern  . Not on file  Social History Narrative  . Not on file     No Known Allergies   Outpatient Medications Prior to Visit  Medication Sig Dispense Refill  . aspirin 81 MG chewable tablet Chew 81 mg by mouth daily.    . Cholecalciferol (VITAMIN D3) 3000 UNITS TABS Take 1,000 Units by mouth.     . cyanocobalamin 100 MCG tablet Take 1,000 mcg by mouth daily.     . Melatonin 3 MG TABS Take by mouth at bedtime.    . Pramipexole Dihydrochloride 1.5 MG TB24 Take 1 tablet (1.5 mg total) by mouth daily. 90 tablet 1   No facility-administered medications prior to visit.     Review of Systems  Constitutional: Negative for chills, fever, malaise/fatigue and weight loss.  HENT: Negative for congestion, nosebleeds, sinus pain and sore throat.   Eyes: Negative for photophobia, pain and discharge.  Respiratory: Positive for shortness of breath. Negative for cough, hemoptysis, sputum production and wheezing.   Cardiovascular: Negative for chest pain, palpitations, orthopnea and leg swelling.  Gastrointestinal: Negative for abdominal pain, constipation, diarrhea, nausea and vomiting.  Genitourinary: Negative for dysuria, frequency, hematuria and urgency.  Musculoskeletal: Negative for back pain, joint pain, myalgias and neck pain.  Skin: Negative for itching and rash.  Neurological: Negative for tingling, tremors, sensory change, speech change, focal weakness, seizures, weakness and headaches.  Psychiatric/Behavioral: Negative for memory loss, substance abuse and suicidal ideas. The patient is not nervous/anxious.       Objective:  Physical Exam   Vitals:   07/27/18 0922  BP: 121/81  Pulse:  71  SpO2: 97%  Weight: 188 lb (85.3 kg)  Height: 5\' 6"  (1.676 m)    RA  Gen: somewhat kyphotic, but well appearing, no acute distress HENT: NCAT, OP clear, neck supple without masses Eyes: PERRL, EOMi Lymph: no cervical lymphadenopathy PULM: CTA B CV: RRR, no mgr, no JVD GI: BS+, soft, nontender, no hsm Derm: no rash or skin breakdown MSK: normal bulk and tone Neuro: A&Ox4, CN II-XII intact, MAEW, occasional tremor R hand Psyche: normal mood and affect  CBC    Component Value Date/Time   WBC 9.1 04/12/2018 0807   RBC 4.64 04/12/2018 0807   HGB 15.5 04/12/2018 0807   HGB 15.8 05/20/2012 0839   HGB 15.4 02/10/2008 0844   HCT 45.4 04/12/2018 0807   HCT 45.1 05/20/2012 0839   HCT 45.0 02/10/2008 0844   PLT 307.0 04/12/2018 0807   PLT 289 05/20/2012 0839   PLT 261 02/10/2008 0844   MCV 98.0 04/12/2018 0807   MCV 95 05/20/2012 0839   MCV 97.2 02/10/2008 0844   MCH 33.1 09/26/2014 0925   MCHC 34.2 04/12/2018 0807   RDW 14.1 04/12/2018 0807   RDW 14.9 05/20/2012 0839   RDW 14.8 (H) 02/10/2008 0844   LYMPHSABS 5.1 (H) 03/27/2015 0824   LYMPHSABS 4.0 (H) 05/20/2012 0839   LYMPHSABS 1.8 02/10/2008 0844   MONOABS 1.0 03/27/2015 0824   MONOABS 0.6 02/10/2008 0844   EOSABS 0.3 03/27/2015 0824   EOSABS 0.4 05/20/2012 0839   BASOSABS 0.1 03/27/2015 0824   BASOSABS 0.0 05/20/2012 0839   BASOSABS 0.0 02/10/2008 0844     Chest imaging: August 2019 chest x-ray images independently reviewed showing normal pulmonary parenchyma and a normal cardiac silhouette  PFT:  Labs:  Path:  Echo: Echocardiogram from July 23, 2018 showed an LVEF of 60 to 65%, normal cavity size, normal wall size, question small muscular VSD mild aortic regurgitation, mild left atrial dilation moderate pulmonary regurgitation  Heart Catheterization:      Assessment & Plan:   Shortness of breath - Plan: Pulmonary function test  DOE (dyspnea on exertion)  Post-splenectomy  Parkinson  disease (HCC)  Non-Hodgkin's lymphoma of spleen, unspecified non-Hodgkin lymphoma type [facility])  Discussion: Pleasant 67 year old male with a past medical history significant for non-Hodgkin's lymphoma and Parkinson's disease comes to my clinic today for evaluation of shortness of breath which developed about 6 months ago.  Objectively his physical exam seems to be within normal limits with the exception of Parkinson's related abnormalities.  His echocardiogram did not show definitive cardiac abnormality with the exception of a question over a small muscular VSD.  I explained to him today that the differential diagnosis of dyspnea is broad and includes lung problems, heart problems, hematologic derangements, neuromuscular problems among others.  In his particular case I strongly suspect that physical deconditioning is contributing to his shortness of breath.  I think it is reasonable to get a lung function test to make sure there is no evidence of an underlying lung problem.  It is not clear to me how to interpret the echocardiogram finding of a possible VSD.  He needs to talk to his primary care doctor about this more.  Plan: Shortness of breath: We will check your oxygen level while walking today We will check a lung function test to look for evidence of an underlying lung problem In general, I doubt there is a lung tissue abnormality I think he should exercise more, I would recommend that she start walking 10 to 15 minutes a day  If no improvement in shortness of breath follow-up with me in 3 months  Call me after the lung function test so I can send you the results    Current Outpatient Medications:  .  aspirin 81 MG chewable tablet, Chew 81 mg by mouth daily., Disp: , Rfl:  .  Cholecalciferol (VITAMIN D3) 3000 UNITS TABS, Take 1,000 Units by mouth. , Disp: , Rfl:  .  cyanocobalamin 100 MCG tablet, Take 1,000 mcg by mouth  daily. , Disp: , Rfl:  .  Melatonin 3 MG TABS, Take by mouth at  bedtime., Disp: , Rfl:  .  Pramipexole Dihydrochloride 1.5 MG TB24, Take 1 tablet (1.5 mg total) by mouth daily., Disp: 90 tablet, Rfl: 1

## 2018-08-15 ENCOUNTER — Other Ambulatory Visit: Payer: Self-pay | Admitting: Neurology

## 2018-08-30 ENCOUNTER — Telehealth: Payer: Self-pay | Admitting: Neurology

## 2018-08-30 NOTE — Telephone Encounter (Signed)
Patient wife called and needs to talk to someone about patient medication. They have not gotten the medication from the mail order and the post office states that it was delivered but they did not get it. He is really worried about this

## 2018-08-30 NOTE — Telephone Encounter (Signed)
Spoke with patient's wife and made her aware that I wasn't sure how to help. She hasn't contacted the pharmacy to alert them that they did not receive the package. She will do that. She is aware I can call in medication to a local pharmacy, but if it has already been charged to insurance, they will have to pay out of pocket for medication if they need to fill again. She will call me back.

## 2018-09-07 NOTE — Progress Notes (Signed)
[name] [name] was seen today in the movement disorders clinic for neurologic consultation at the request of [name], DO.  The consultation is for the evaluation of tremor.  This patient is accompanied in the office by his spouse who supplements the history.    The pt was dx with dystonia in [place] in 1994.  He presented to the speech pathologist and was dx with spasmodic dysphonia.  He was then referred to the neurologist who confirmed the diagnosis and did and MRI of the brain that was normal.  He had botox in to the vocal cords and that was helpful.  He would lose the voice for 6 weeks after the injections and then would get the voice back for 6 weeks.  He would go back to [place] twice per year for the injections.  He hasn't had the injections for the last 2 years as he quit working.  The spasmodic dysphonia is worse on the phone.  Last ST was 1995.  Having some trouble with swallowing.  Had developed some movements in the face and tried botox there but didn't really help there.  Has had some neck extension as well with the dystonic movements.  Wife states that these sx's have been better since retirement; he is under less stress.  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  The patient reports that [name] tremor started in the thumb in may and has slowly gotten worse and 3 weeks ago, he noted just a little in the [name].  He notes the tremor most at rest.  09/26/14 update:  The patient returns today for follow-up.  The patient has a history of Meige's syndrome, but presented last time with parkinsonism, but did not meet all the criteria for idiopathic Parkinson's disease.  Nonetheless, the patient wanted to start on medication.  We started him on Mirapex ER and he has worked his way up to 1.5 mg daily.  The patient reports that he is about 50% better.  He states that it has helped tremor but it isn't gone.  His wife thinks that mirapex is disrupting his sleep.   He has constipation and  thought that it was the mirapex.  His balance has been good.   He had an MRI of the brain since last visit and that was unremarkable.  Because of significant dysphagia from his Meige syndrome he had a modified barium swallow on 08/07/2014 and that revealed mild oral dysphagia, moderate pharyngeal phase dysphagia and a regular diet with thin liquids was recommended.   Has a strange sensation under the bottom of the foot like something is under the ball of the foot.  Has had paresthesias off and on for a long time.  Did get chemo with nonHodgkins and been on B6 since.  01/02/15 update:  The patient is following up today, accompanied by his wife is supplements the history.  The patient is on Mirapex ER, 1.5 mg daily.  Overall, the patient states that he is doing well.  No falls.  No hallucinations.  He continues to have issues with dysphagia, but that is primarily from his long history of Meige syndrome.  He continues to not need much sleep at night but it is not transitioning into daytime sleepiness.    05/03/15 update:  The patient returns today for follow-up, accompanied by his wife who supplements the history.  The patient is on pramipexole ER, 1.5 mg daily. He is doing well on it. He states  that he was exercising but he "fell off the wagon."   He was referred last visit for physical and speech therapy. He states that he never got a call in that regard.   He does have a history of Meige syndrome, which is the primarily etiology for the dysphagia and some of the speech issues.  Last visit, I did ask him to add B12 supplementation, as his B12 was on the low end of normal.  He has been taking his B12 supplement faithfully.  He already he has peripheral neuropathy because of his history of chemotherapy.   Pt states that he is sleeping better than last visit.    09/03/15 update:  The patient follows up today, accompanied by his wife who supplements the history.  He remains on pramipexole ER, 1.5 mg daily.  He has  completed physical therapy and remains in speech therapy.  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  He is doing his PT at home.  Hasn't been faithful with CV exercise.  Has just started melatonin for insomnia and thinks it is helping.   Overall, the patient has been stable.  He has not experienced any falls.  No hallucinations.  No lightheadedness or near syncope.  He has not choked on any of his foods.  His wife is noting some short term memory issues - he forgot to shut garage door and one time forgot to take mirapex.  Pt noted that he had bad RLS when he did that.    01/08/16 update:  The patient follows up today, accompanied by his wife who supplements the history.  He is on Mirapex ER, 1.5 g daily.  He denies compulsive behaviors.  No falls since last visit.  He does have tremor, but not bothersome enough to change his medication.  No hallucinations.  No lightheadedness or near syncope.  He did have a modified barium swallow on 10/18/2015.  There is mild oral and moderate pharyngeal dysphagia.  This was worse with solids than liquids.  The patient felt that this was actually better than his last study.  There is no dietary changes recommended interventions recommended that he eat slowly, take small bites and drink plenty of liquids after his small solid bites.  04/11/16 update:  The patient follows up today, accompanied by his wife who supplements the history.  He is on Mirapex ER, 1.5 g daily.  He denies compulsive behaviors.  No falls since last visit.  He does have tremor, but not bothersome enough to change his medication.  No hallucinations.  No lightheadedness or near syncope.  He did have his therapy screens and while physical therapy was not recommended, occupational and speech therapy were and he will be starting those soon.  Not exercising.    07/17/16 update:  The patient is seen today, accompanied by his wife who supplements the  history.  He is on pramipexole ER, 1.5 mg daily.  He reports that he is doing well.  He continues to have some tremor, but does not want to change his medications.  He denies any falls.  He denies hallucinations.  He denies compulsive behaviors.  He denies lightheadedness or near syncope.  He is now faithfully exercising for 2 months.  His doctor told him that he had to clean up his diet and start exercising and he did this.  He is physically feeling better.  11/21/16 update:  Patient seen today, accompanied by his wife  who supplements the history.  Patient remains on pramipexole ER, 1.5 mg daily.  Patient reports that he has been stable.  Biggest issue is tremor, but that has not changed.  He denies any falls.  Denies compulsive behaviors.  Denies sleep attacks.  Denies lightheadedness or near syncope.  Regarding exercise, he states "I was going great for a few months and then I got off the wagon."  States that his diet has been good and his cholesterol was 25% improved.    04/27/17 update:  Patient seen today in follow-up, accompanied by his wife who supplements the history.  Patient is on pramipexole ER, 1.5 mg daily.  Continues to have tremor, but that has been stable.  Pt denies falls.  Occasionally off balance at the turns.  Pt denies lightheadedness, near syncope.  No hallucinations.  Mood has been good.  In regards to exercise, he states that he is planning to start the [employer] class next week.  His diet has been fairly good.  He is taking his B12 supplement.    10/29/17 update: Patient is seen today in follow-up.  He is accompanied by his wife who supplements the history.  Patient has a history of both Meige syndrome as well as Parkinson's disease.  He is on pramipexole ER, 1.5 mg daily.  Overall, the patient has been stable since our last visit.  The records that were made available to me were reviewed.  Went to PT since our last visit.  He is doing [employer] class and he likes that.  Swallowing has been  "better."  No falls but balance may not be as good.  Tremor has been about the same.  Wife asks about memory.  Worse when patients wife is sick and he has to do all the chores.  Issue is multitasking.  04/28/18 update: Patient is seen today in follow-up for Meige syndrome and Parkinson's disease.  He is accompanied by his wife who supplements the history.  The patient remains on pramipexole ER, 1.5 mg daily.  Forgot med one day and "it was awful."  His legs had more tremor but he just didn't feel good.  Tremor is intermittent.  No falls since our last visit.  Continues to exercise.  Had his neuro rehab evaluations on April 13, 2018 and I reviewed these.  No physical therapy was recommended, but occupational and speech therapy were recommended.  There is so memory change per pts wife but pt doesn't notice.  Some trouble with multitasking per pt.  Meds are put in a pillbox.   He is doing [employer] moves class and it really helps.  Pts wife thinks pt "grumpier" than normal and pt agrees.  Pt also c/o falling asleep easier.  If actively engaged he has no issue.  If relaxed, falls asleep quick.  Noting some SOB and started on albuterol and pt notes no difference and says that dr talked to him about echo.  He has a f/u on Monday.    09/09/18 update: Patient seen today in follow-up for Parkinson's, accompanied by his wife who supplements the history.  Patient is on pramipexole ER, 1.5 mg daily.  If he forgets the pill, "my legs get horribly jumpy."  He denies sleep attacks.  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  He is doing [employer] moves.   He  is not falling.   Swallowing has been "better." he is sometimes feeling stuck with the turns.  He doesn't feel like he is going to fall with it.   Records are reviewed since our last visit.  The patient saw Dr. Lake Bells for shortness of breath on July 27, 2018.  Dr. Lake Bells  did not think that it was a primary lung issue, but felt that getting a pulmonary function test would be reasonable.  Pt needs to call to make that appt.  ALLERGIES:  No Known Allergies  CURRENT MEDICATIONS:  Outpatient Encounter Medications as of 09/09/2018  Medication Sig  . aspirin 81 MG chewable tablet Chew 81 mg by mouth daily.  . Cholecalciferol (VITAMIN D3) 3000 UNITS TABS Take 1,000 Units by mouth.   . cyanocobalamin 100 MCG tablet Take 1,000 mcg by mouth daily.   . Melatonin 3 MG TABS Take by mouth at bedtime.  . Pramipexole Dihydrochloride 1.5 MG TB24 Take 1 tablet (1.5 mg total) by mouth daily.  . [DISCONTINUED] Pramipexole Dihydrochloride 1.5 MG TB24 TAKE 1 TABLET DAILY   No facility-administered encounter medications on file as of 09/09/2018.     PAST MEDICAL HISTORY:   Past Medical History:  Diagnosis Date  . Cervical lymphadenopathy    right - followed my ENT  . Dystonia 1994   diagnosed in [place]  . Non Hodgkin's lymphoma ([place] [facility]) 2007   diffuse- 6 cycles of chemo with R CHOP Rituxan - last dose 10/08  . Parkinson disease ([place])    2015    PAST SURGICAL HISTORY:   Past Surgical History:  Procedure Laterality Date  . CHOLECYSTECTOMY, LAPAROSCOPIC    . EYE SURGERY     x2 as child  . PORT-A-CATH REMOVAL    . PORTACATH PLACEMENT    . SPLENECTOMY    . TONSILLECTOMY      SOCIAL HISTORY:   Social History   Socioeconomic History  . Marital status: Married    Spouse name: Not on file  . Number of children: Not on file  . Years of education: Not on file  . Highest education level: Not on file  Occupational History  . Not on file  Social Needs  . Financial resource strain: Not on file  . Food insecurity:    Worry: Not on file    Inability: Not on file  . Transportation needs:    Medical: Not on file    Non-medical: Not on file  Tobacco Use  . Smoking status: Never Smoker  . Smokeless tobacco: Never Used  Substance and Sexual Activity  . Alcohol use:  No    Alcohol/week: 0.0 standard drinks  . Drug use: No  . Sexual activity: Not on file  Lifestyle  . Physical activity:    Days per week: Not on file    Minutes per session: Not on file  . Stress: Not on file  Relationships  . Social connections:    Talks on phone: Not on file    Gets together: Not on file    Attends religious service: Not on file    Active member of club or organization: Not on file    Attends meetings of clubs or organizations: Not on file    Relationship status: Not on file  . Intimate partner violence:    Fear of current or ex partner: Not on file    Emotionally abused: Not on file    Physically abused: Not on file    Forced sexual activity: Not  on file  Other Topics Concern  . Not on file  Social History Narrative  . Not on file    FAMILY HISTORY:   Family Status  Relation Name Status  . Mother  Deceased       heart disease, lung cancer  . Father  Deceased       heart attack  . Brother half Deceased       colon cancer  . Sister half Alive       unknown  . Son  Alive       anemia  . Son  Alive       healthy  . Daughter  Alive       healthy  . Son  Deceased       debrancher's enzyme disease  . Son  Deceased       debrancher's enzyme disease    ROS:  Review of Systems  Constitutional: Negative.   HENT: Negative.   Eyes: Negative.   Cardiovascular: Negative.   Gastrointestinal: Negative.   Genitourinary: Negative.   Skin: Negative.   Endo/Heme/Allergies: Negative.     PHYSICAL EXAMINATION:    VITALS:   Vitals:   09/09/18 0816  BP: 118/76  Pulse: 74  SpO2: 95%  Weight: 191 lb (86.6 kg)  Height: 5\' 6"  (1.676 m)   Wt Readings from Last 3 Encounters:  09/09/18 191 lb (86.6 kg)  07/27/18 188 lb (85.3 kg)  07/15/18 188 lb (85.3 kg)   GEN:  The patient appears stated age and is in NAD. HEENT:  Normocephalic, atraumatic.  The mucous membranes are moist. The superficial temporal arteries are without ropiness or tenderness. CV:   RRR Lungs:  CTAB Neck/HEME:  There are no carotid bruits bilaterally.  Neurological examination:  Orientation: The patient is alert and oriented x3. Cranial nerves: There is good facial symmetry.  Mild oromandibular dystonia.  There is left exotropia that is chronic.  The speech is fluent and bulbar in quality with spasmodic dysphonia. Soft palate rises symmetrically and there is no tongue deviation. Hearing is intact to conversational tone. Sensation: Sensation is intact to light touch throughout Motor: Strength is 5/5 in the bilateral upper and lower extremities.   Shoulder shrug is equal and symmetric.  There is no pronator drift.   Movement examination: Tone: There is normal tone in the bilateral upper extremities.  The tone in the lower extremities is normal.  Abnormal movements: There is very little tremor today, but when he has it is mostly in the right arm, although occasionally in the left. Coordination:  There is mild decremation with finger taps bilaterally and toe taps on the right.  All other rapid alternating movements were normal.   Gait and Station: The patient has no difficulty arising out of a deep-seated chair without the use of the hands.  Slight decrease in arm swing on the left.  The patient's stride length is normal.  The patient has a positive pull test.     Lab Results  Component Value Date   VITAMINB12 292 09/26/2014   .   Chemistry      Component Value Date/Time   NA 140 04/12/2018 0807   K 4.5 04/12/2018 0807   CL 104 04/12/2018 0807   CO2 29 04/12/2018 0807   BUN 16 04/12/2018 0807   CREATININE 1.09 04/12/2018 0807   CREATININE 0.94 09/26/2014 0925      Component Value Date/Time   CALCIUM 10.1 04/12/2018 0807   ALKPHOS 57 04/12/2018  0807   AST 21 04/12/2018 0807   ALT 35 04/12/2018 0807   BILITOT 0.8 04/12/2018 0807     Lab Results  Component Value Date   WBC 9.1 04/12/2018   HGB 15.5 04/12/2018   HCT 45.4 04/12/2018   MCV 98.0 04/12/2018    PLT 307.0 04/12/2018       ASSESSMENT/PLAN:  1.  Mild PD  -Even though he has prominent facial dystonia (meige syndrome), I don't think that this is related as this started in 1994.  He would like to increase the mirapex er to 2.25 mg daily due to trouble with the turns (not noted today).  He asked me to hold on sending that because he wants to finishing the 1.5 mg he has left (which is a lot).   Wife also noting some shuffling.  Risks, benefits, side effects and alternative therapies were discussed.  The opportunity to ask questions was given and they were answered to the best of my ability.  The patient expressed understanding and willingness to follow the outlined treatment protocols. 2.  Dysphagia  -primarily related to Meige syndrome.  -He did have a modified barium swallow on 10/18/2015.  There is mild oral and moderate pharyngeal dysphagia.  This was worse with solids than liquids.   He really thinks that he is doing well in this regard. 3.  Paresthesias.  -Probable due to peripheral neuropathy, which is likely due to his history of chemotherapy from non-Hodgkin's lymphoma.    -His B12 has been on the low end, but he has been faithfully taking a supplement.   4.  Follow up is anticipated in the next few months, sooner should new neurologic issues arise.  Much greater than 50% of this visit was spent in counseling and coordinating care.  Total face to face time:  25 min

## 2018-09-09 ENCOUNTER — Ambulatory Visit (INDEPENDENT_AMBULATORY_CARE_PROVIDER_SITE_OTHER): Payer: Medicare Other | Admitting: Neurology

## 2018-09-09 ENCOUNTER — Encounter: Payer: Self-pay | Admitting: Neurology

## 2018-09-09 VITALS — BP 118/76 | HR 74 | Ht 66.0 in | Wt 191.0 lb

## 2018-09-09 DIAGNOSIS — G244 Idiopathic orofacial dystonia: Secondary | ICD-10-CM | POA: Diagnosis not present

## 2018-09-09 DIAGNOSIS — G20 Parkinson's disease: Secondary | ICD-10-CM

## 2018-10-27 ENCOUNTER — Telehealth: Payer: Self-pay | Admitting: Neurology

## 2018-10-27 MED ORDER — PRAMIPEXOLE DIHYDROCHLORIDE ER 2.25 MG PO TB24: 1.0000 | ORAL_TABLET | Freq: Every day | ORAL | 1 refills | Status: DC

## 2018-10-27 NOTE — Telephone Encounter (Signed)
Left message on machine for patient/wife to call back.

## 2018-10-27 NOTE — Telephone Encounter (Signed)
Spoke with patient's wife. He has almost finished 1.5 mg mirapex and ready for increased dose. 2.25 mg sent to [facility].

## 2018-10-27 NOTE — Telephone Encounter (Signed)
Patient's wife called and she has a questions about [name] Pramipexole medication. She said he is taking 1.5 MG. He uses [facility]. Please Call. Thanks

## 2018-12-30 ENCOUNTER — Ambulatory Visit: Payer: Medicare Other | Admitting: Physical Therapy

## 2018-12-30 ENCOUNTER — Ambulatory Visit: Payer: Medicare Other | Admitting: Occupational Therapy

## 2018-12-30 ENCOUNTER — Ambulatory Visit: Payer: Medicare Other

## 2019-01-17 ENCOUNTER — Encounter: Payer: Self-pay | Admitting: *Deleted

## 2019-01-17 NOTE — Progress Notes (Signed)
Virtual Visit via Video Note The purpose of this virtual visit is to provide medical care while limiting exposure to the novel coronavirus.    Consent was obtained for video visit:  Yes.   Answered questions that patient had about telehealth interaction:  Yes.   I discussed the limitations, risks, security and privacy concerns of performing an evaluation and management service by telemedicine. I also discussed with the patient that there may be a patient responsible charge related to this service. The patient expressed understanding and agreed to proceed.  Pt location: Home Physician Location: office Name of referring provider:  [name]* I connected with Janace Aris at patients initiation/request on 01/18/2019 at  1:00 PM EDT by video enabled telemedicine application and verified that I am speaking with the correct person using two identifiers. Pt MRN:  [id number] Pt DOB:  [birth year] Video Participants:  Janace Aris;     History of Present Illness:  Patient is seen today in follow-up for Parkinson's disease.  Last visit, I increased his pramipexole to 2.25 mg daily, although he did not get that filled until February.  He noted no big difference with that.  He has had no falls.  No compulsive behaviors.  No sleep attacks.  He reports that he isn't doing any exercise.  He doesn't sleep for long at night (5 hours) and may or may not feel rested.  Melatonin has helped consolidate the sleep.  Swallowing has been good.  On his b12 for mild deficiency.  Current Outpatient Medications on File Prior to Visit  Medication Sig Dispense Refill  . aspirin 81 MG chewable tablet Chew 81 mg by mouth daily.    . Cholecalciferol (VITAMIN D3) 3000 UNITS TABS Take 1,000 Units by mouth.     . cyanocobalamin 100 MCG tablet Take 1,000 mcg by mouth daily.     . Melatonin 3 MG TABS Take by mouth at bedtime.    . Pramipexole Dihydrochloride (MIRAPEX ER) 2.25 MG TB24 Take 1 tablet by mouth  daily. 90 tablet 1   No current facility-administered medications on file prior to visit.       Observations/Objective:   Vitals:   01/18/19 1247  BP: 121/86  Pulse: 92  Weight: 180 lb (81.6 kg)  Height: 5\' 6"  (1.676 m)   GEN:  The patient appears stated age and is in NAD.  Neurological examination:  Orientation: The patient is alert and oriented x3. Cranial nerves: There is good facial symmetry. There is mild facial hypomimia.  The speech is fluent and clear. Soft palate rises symmetrically and there is no tongue deviation. Hearing is intact to conversational tone. Motor: Strength is at least antigravity x 4.   Shoulder shrug is equal and symmetric.  There is no pronator drift.  Movement examination: Tone: unable Abnormal movements: He has right upper extremity resting tremor. Coordination:  There is no decremation with RAM's, with any form of RAMS, including alternating supination and pronation of the forearm, hand opening and closing, finger taps bilaterally Gait and Station: The patient has no difficulty arising out of a deep-seated chair without the use of the hands. The patient's stride length is good.      Assessment and Plan:   1.  Mild PD             -Continue pramipexole ER, 2.25 mg daily.  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  -Encouraged the patient to get back to exercise. 2.  Dysphagia             -  primarily related to Meige syndrome.             -He did have a modified barium swallow on 10/18/2015.  There is mild oral and moderate pharyngeal dysphagia.  This was worse with solids than liquids.   He really thinks that he is doing well in this regard. 3.  Paresthesias.             -Probable due to peripheral neuropathy, which is likely due to his history of chemotherapy from non-Hodgkin's lymphoma.               -His B12 has been on the low end, but he has been faithfully taking a supplement.   4.  Insomnia  -really just short phase sleeper but still sleepy  in day.  Melatonin has helped consolidate sleep.  Discussed klonopin and remeron.  He wanted to talk to his wife to see about her impression about what his mood was, to see if mirtazapine would be a good choice.  She was not involved with the video visit.  He will let me know if he would like to start any or either of these medications.  Follow Up Instructions:  Will see him in follow-up in the next 5 months.  -I discussed the assessment and treatment plan with the patient. The patient was provided an opportunity to ask questions and all were answered. The patient agreed with the plan and demonstrated an understanding of the instructions.   The patient was advised to call back or seek an in-person evaluation if the symptoms worsen or if the condition fails to improve as anticipated.    Total Time spent in visit with the patient was:  16 min, of which more than 50% of the time was spent in counseling and/or coordinating care on safety and the importance of exercise.   Pt understands and agrees with the plan of care outlined.     Alonza Bogus, DO

## 2019-01-18 ENCOUNTER — Other Ambulatory Visit: Payer: Self-pay

## 2019-01-18 ENCOUNTER — Encounter: Payer: Self-pay | Admitting: Neurology

## 2019-01-18 ENCOUNTER — Telehealth (INDEPENDENT_AMBULATORY_CARE_PROVIDER_SITE_OTHER): Payer: Medicare Other | Admitting: Neurology

## 2019-01-18 DIAGNOSIS — G20 Parkinson's disease: Secondary | ICD-10-CM | POA: Diagnosis not present

## 2019-01-26 ENCOUNTER — Ambulatory Visit: Payer: Medicare Other | Admitting: Neurology

## 2019-03-15 ENCOUNTER — Encounter: Payer: Self-pay | Admitting: Family Medicine

## 2019-03-15 ENCOUNTER — Ambulatory Visit (INDEPENDENT_AMBULATORY_CARE_PROVIDER_SITE_OTHER): Payer: Medicare Other | Admitting: Family Medicine

## 2019-03-15 ENCOUNTER — Other Ambulatory Visit: Payer: Self-pay

## 2019-03-15 DIAGNOSIS — M25471 Effusion, right ankle: Secondary | ICD-10-CM

## 2019-03-15 NOTE — Progress Notes (Signed)
Musculoskeletal Exam  Patient: DMITRY MACOMBER DOB: 1951/01/22  DOS: 03/15/2019  SUBJECTIVE:  Chief Complaint:   Chief Complaint  Patient presents with  . Foot Pain  . Edema    NASHON ERBES is a 68 y.o.  male for evaluation and treatment of  R ankle swelling. Due to COVID-19 pandemic, we are interacting via web portal for an electronic face-to-face visit. I verified patient's ID using 2 identifiers. Patient agreed to proceed with visit via this method. Patient is at home, I am at office. Patient, wife, and I are present for visit.    Onset:  3 months ago. No inj or change in activity.  Location: R ankle There is no pain Sometimes goes up, but never gets to normal Associated symptoms: Denies bruising, redness, catch/locking, instability Treatment: to date has been none.   Neurovascular symptoms: no  ROS: Musculoskeletal/Extremities: +R ankle swelling  Past Medical History:  Diagnosis Date  . Cervical lymphadenopathy    right - followed my ENT  . Dystonia 1994   diagnosed in [place]  . Non Hodgkin's lymphoma ([place]) 2007   diffuse- 6 cycles of chemo with R CHOP Rituxan - last dose 10/08  . Parkinson disease ([place])    2015    Objective: No conversational dyspnea Age appropriate judgment and insight Nml affect and mood  Assessment:  Right ankle swelling - Plan: Uric acid, stretches/exercises, elevate, compression  Plan: Orders as above. F/u in 1 mo if no better, in person. The patient voiced understanding and agreement to the plan.   Marcus, DO 03/15/19  11:01 AM

## 2019-04-12 ENCOUNTER — Encounter: Payer: Self-pay | Admitting: Family Medicine

## 2019-04-12 ENCOUNTER — Ambulatory Visit (INDEPENDENT_AMBULATORY_CARE_PROVIDER_SITE_OTHER): Payer: Medicare Other | Admitting: Family Medicine

## 2019-04-12 ENCOUNTER — Other Ambulatory Visit: Payer: Self-pay

## 2019-04-12 VITALS — BP 120/72 | HR 79 | Temp 97.7°F | Ht 66.0 in | Wt 195.0 lb

## 2019-04-12 DIAGNOSIS — R2241 Localized swelling, mass and lump, right lower limb: Secondary | ICD-10-CM

## 2019-04-12 HISTORY — DX: Localized swelling, mass and lump, right lower limb: R22.41

## 2019-04-12 NOTE — Patient Instructions (Addendum)
This is not dangerous. If things get worse, please let me know. The next step would be to see a vascular specialist.   Mind the salt intake. Keep the leg elevated. Wear the compression stocking when able. Stay active.   Let us know if you need anything.

## 2019-04-12 NOTE — Progress Notes (Signed)
Chief Complaint  Patient presents with  . Follow-up    Subjective: Patient is a 69 y.o. male here for f/u swelling.  R LE swelling no better. Still not having pain. This puts him at 4 mo out. No injr or change in activity. Started wearing otc compression stocking that did help a little. L is swollen a little. No Cp or SOB. Tries to walk.   ROS: Heart: Denies chest pain  Lungs: Denies SOB   Past Medical History:  Diagnosis Date  . Cervical lymphadenopathy    right - followed my ENT  . Dystonia 1994   diagnosed in [place]  . Non Hodgkin's lymphoma ([place]) 2007   diffuse- 6 cycles of chemo with R CHOP Rituxan - last dose 10/08  . Parkinson disease ([place])    2015    Objective: BP 120/72 (BP Location: Left Arm, Patient Position: Sitting, Cuff Size: Normal)   Pulse 79   Temp 97.7 F (36.5 C) (Oral)   Ht 5\' 6"  (1.676 m)   Wt 195 lb (88.5 kg)   SpO2 95%   BMI 31.47 kg/m  General: Awake, appears stated age HEENT: MMM, EOMi Heart: RRR, +LE edema b/l, 2+ on R, 1+ on L; both tapering at prox 1/3 of tibia.  MSK: No calf pain.  Lungs: CTAB, no rales, wheezes or rhonchi. No accessory muscle use Psych: Age appropriate judgment and insight, normal affect and mood  Assessment and Plan: Localized swelling of right lower extremity- compression stocking rx given. Stay active, mind salt intake, elevate leg. If this worsens or starts to bother, will refer to vascular surg. Low likelihood for DVT, I suspect venous valve failure.  F/u in 3 mo.  The patient voiced understanding and agreement to the plan.  [place], DO 04/12/19  8:22 AM

## 2019-05-04 ENCOUNTER — Other Ambulatory Visit: Payer: Self-pay | Admitting: Neurology

## 2019-05-04 NOTE — Telephone Encounter (Signed)
Requested Prescriptions   Pending Prescriptions Disp Refills  . Pramipexole Dihydrochloride 2.25 MG TB24 [Pharmacy Med Name: PRAMIPEXOLE  TAB 2.25MG ER] 90 tablet 1    Sig: TAKE 1 TABLET DAILY   Rx last filled: 10/27/18 #90 1 refills  Pt last seen: 01/18/19   Follow up appt scheduled: 06/23/19

## 2019-06-20 ENCOUNTER — Telehealth: Payer: Self-pay | Admitting: Neurology

## 2019-06-20 NOTE — Telephone Encounter (Signed)
He will probably be okay, since he isn't on the max dose of pramipexole anyway and what he took makes about the max dose in a 24 hour period of time.  Watch for excess lightheadedness/dizziness, any hallucinations but I think he will probably be ok

## 2019-06-20 NOTE — Telephone Encounter (Signed)
Spoke with spouse she was made aware of provider response

## 2019-06-20 NOTE — Telephone Encounter (Signed)
Patient's wife, Loletha Carrow, called and said the patient mistakenly took two doses of Pramipexole within 7 hours, taking one at around bedtime and again around 1:00 AM. His wife noticed his pills were missing from his containers this morning. She said the patient didn't realize the mistake.  Patient's wife reported the patient is not showing any obvious side-effects.

## 2019-06-20 NOTE — Telephone Encounter (Signed)
See below Should patient be looking for any sign or symptoms from taken medication this way?

## 2019-06-21 NOTE — Progress Notes (Signed)
[name] [name] was seen today in follow up for Parkinsons disease.  Pt is currently on pramipexole ER, 2.25 mg daily.  A few days ago, he accidentally took 2 of these within a 24-hour time span.  It made him sleepy and a bit cognitively dull but he noted his tremor was much better.  "I wouldn't want to be like that all the time."  I have reviewed medical record since our last visit.  He has been having right ankle swelling for several months (but has been on pramipexole much longer than that).  He has been following with his primary care for this.  He has been wearing compression stockings because of it.  Pt denies falls.  Pt denies lightheadedness, near syncope.  No hallucinations.  Mood has been good.  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  Taking a B12 supplement, as he has mild B12 deficiency.   PREVIOUS MEDICATIONS: Mirapex  ALLERGIES:  No Known Allergies  CURRENT MEDICATIONS:  Outpatient Encounter Medications as of 06/23/2019  Medication Sig  . aspirin 81 MG chewable tablet Chew 81 mg by mouth daily.  . Cholecalciferol (VITAMIN D3) 3000 UNITS TABS Take 1,000 Units by mouth.   . cyanocobalamin 100 MCG tablet Take 1,000 mcg by mouth daily.   . Melatonin 3 MG TABS Take by mouth at bedtime.  . Pramipexole Dihydrochloride 2.25 MG TB24 TAKE 1 TABLET DAILY  . carbidopa-levodopa (SINEMET IR) 25-100 MG tablet Take 1 tablet by mouth 3 (three) times daily.   No facility-administered encounter medications on file as of 06/23/2019.     PAST MEDICAL HISTORY:   Past Medical History:  Diagnosis Date  . Cervical lymphadenopathy    right - followed my ENT  . Dystonia 1994   diagnosed in [place]  . Non Hodgkin's lymphoma ([place]) 2007   diffuse- 6 cycles of chemo with R CHOP Rituxan - last dose 10/08  . Parkinson disease ([place])    2015    PAST SURGICAL HISTORY:   Past Surgical History:  Procedure Laterality Date  . CHOLECYSTECTOMY, LAPAROSCOPIC    . EYE SURGERY     x2 as child   . PORT-A-CATH REMOVAL    . PORTACATH PLACEMENT    . SPLENECTOMY    . TONSILLECTOMY      SOCIAL HISTORY:   Social History   Socioeconomic History  . Marital status: Married    Spouse name: Not on file  . Number of children: 5  . Years of education: Not on file  . Highest education level: Bachelor's degree (e.g., BA, AB, BS)  Occupational History  . Not on file  Social Needs  . Financial resource strain: Not on file  . Food insecurity    Worry: Not on file    Inability: Not on file  . Transportation needs    Medical: Not on file    Non-medical: Not on file  Tobacco Use  . Smoking status: Never Smoker  . Smokeless tobacco: Never Used  Substance and Sexual Activity  . Alcohol use: No    Alcohol/week: 0.0 standard drinks  . Drug use: No  . Sexual activity: Not on file  Lifestyle  . Physical activity    Days per week: Not on file    Minutes per session: Not on file  . Stress: Not on file  Relationships  . Social Herbalist on phone: Not on file    Gets together: Not on file  Attends religious service: Not on file    Active member of club or organization: Not on file    Attends meetings of clubs or organizations: Not on file    Relationship status: Not on file  . Intimate partner violence    Fear of current or ex partner: Not on file    Emotionally abused: Not on file    Physically abused: Not on file    Forced sexual activity: Not on file  Other Topics Concern  . Not on file  Social History Narrative  . Not on file    FAMILY HISTORY:   Family Status  Relation Name Status  . Mother  Deceased       heart disease, lung cancer  . Father  Deceased       heart attack  . Brother half Deceased       colon cancer  . Sister half Alive       unknown  . Son  Alive       anemia  . Son  Alive       healthy  . Daughter  Alive       healthy  . Son  Deceased       debrancher's enzyme disease  . Son  Deceased       debrancher's enzyme disease     ROS:  Review of Systems  Constitutional: Positive for malaise/fatigue.  Eyes: Negative.   Respiratory: Positive for shortness of breath (intermittent).   Cardiovascular: Positive for leg swelling.  Gastrointestinal: Negative.   Genitourinary: Negative.   Musculoskeletal: Negative.   Skin: Negative.   Endo/Heme/Allergies: Negative.     PHYSICAL EXAMINATION:    VITALS:   Vitals:   06/23/19 0816  BP: 97/62  Pulse: 74  SpO2: 95%  Weight: 197 lb 12.8 oz (89.7 kg)  Height: 5\' 6"  (1.676 m)    GEN:  The patient appears stated age and is in NAD. HEENT:  Normocephalic, atraumatic.  The mucous membranes are moist. The superficial temporal arteries are without ropiness or tenderness. CV:  RRR Lungs:  CTAB Neck/HEME:  There are no carotid bruits bilaterally.  Neurological examination:  Orientation: The patient is alert and oriented x3. Cranial nerves: There is good facial symmetry with facial hypomimia. The speech is fluent and clear but mildly dysarthric and hypophonic. Soft palate rises symmetrically and there is no tongue deviation. Hearing is intact to conversational tone. Sensation: Sensation is intact to light touch throughout Motor: Strength is at least antigravity x4.  Movement examination: Tone: There is normal tone in the UE/LE Abnormal movements: there is intermittent [name]>LUE resting tremor Coordination:  There is mild decremation with RAM's, with any form of RAMS, including alternating supination and pronation of the forearm, hand opening and closing, finger taps, heel taps and toe taps, Gait and Station: The patient has no difficulty arising out of a deep-seated chair without the use of the hands. The patient's stride length is decreased with pisa syndrome and decreased arm swing on the L.      Chemistry      Component Value Date/Time   NA 140 04/12/2018 0807   K 4.5 04/12/2018 0807   CL 104 04/12/2018 0807   CO2 29 04/12/2018 0807   BUN 16 04/12/2018 0807    CREATININE 1.09 04/12/2018 0807   CREATININE 0.94 09/26/2014 0925      Component Value Date/Time   CALCIUM 10.1 04/12/2018 0807   ALKPHOS 57 04/12/2018 0807   AST 21 04/12/2018  0807   ALT 35 04/12/2018 0807   BILITOT 0.8 04/12/2018 0807       ASSESSMENT/PLAN:  1. Mild PD -Continue pramipexole ER, 2.25 mg daily.               -Encouraged the patient to get back to exercise.  -start carbidopa/levodopa 25/100 and work to tid.  Risks, benefits, side effects and alternative therapies were discussed.  The opportunity to ask questions was given and they were answered to the best of my ability.  The patient expressed understanding and willingness to follow the outlined treatment protocols. 2. 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 -He did have a modified barium swallow on 10/18/2015. There is mild oral and moderate pharyngeal dysphagia. This was worse with solids than liquids. He really thinks that he is doing well in this regard. 3. Paresthesias. -Probable due to peripheral neuropathy, which is likely due to his history of chemotherapy from non-Hodgkin's lymphoma.  -His B12 has been on the low end, but he has been faithfully taking a supplement.  4.  Insomnia             -having trouble sleeping but also a short phase sleeper.  Wants to try something.  I want him to call me in a few weeks so that I am not starting 2 medications at once.  We discussed klonopin and remeron, and will start low dose klonopin if still having trouble in a few weeks.  5.  Follow up is anticipated in the next 4-6 months, sooner should new neurologic issues arise.  Much greater than 50% of this visit was spent in counseling and coordinating care.  Total face to face time:  25 min  Cc:  [name], DO

## 2019-06-23 ENCOUNTER — Encounter: Payer: Self-pay | Admitting: Neurology

## 2019-06-23 ENCOUNTER — Ambulatory Visit (INDEPENDENT_AMBULATORY_CARE_PROVIDER_SITE_OTHER): Payer: Medicare Other | Admitting: Neurology

## 2019-06-23 ENCOUNTER — Other Ambulatory Visit: Payer: Self-pay

## 2019-06-23 VITALS — BP 97/62 | HR 74 | Ht 66.0 in | Wt 197.8 lb

## 2019-06-23 DIAGNOSIS — G244 Idiopathic orofacial dystonia: Secondary | ICD-10-CM | POA: Diagnosis not present

## 2019-06-23 DIAGNOSIS — G4700 Insomnia, unspecified: Secondary | ICD-10-CM | POA: Diagnosis not present

## 2019-06-23 DIAGNOSIS — G20 Parkinson's disease: Secondary | ICD-10-CM

## 2019-06-23 MED ORDER — CARBIDOPA-LEVODOPA 25-100 MG PO TABS: 1.0000 | ORAL_TABLET | Freq: Three times a day (TID) | ORAL | 1 refills | Status: DC

## 2019-06-23 NOTE — Patient Instructions (Addendum)
Start Carbidopa Levodopa as follows:  Take 1/2 tablet three times daily, at least 30 minutes before meals, for one week  Then take 1/2 tablet in the morning, 1/2 tablet in the afternoon, 1 tablet in the evening, at least 30 minutes before meals, for one week  Then take 1/2 tablet in the morning, 1 tablet in the afternoon, 1 tablet in the evening, at least 30 minutes before meals, for one week  Then take 1 tablet three times daily, at approximately 6-7am/11am/4pm   As a reminder, carbidopa/levodopa can be taken at the same time as a carbohydrate, but we like to have you take your pill either 30 minutes before a protein source or 1 hour after as protein can interfere with carbidopa/levodopa absorption.   I want you to call me in a few weeks regarding sleep so that I am not starting 2 medications at once

## 2019-07-06 NOTE — Telephone Encounter (Signed)
YES

## 2019-07-06 NOTE — Telephone Encounter (Signed)
Sending to clinical staff for review: Okay to sign/close encounter or is further follow up needed? ° °

## 2019-07-14 ENCOUNTER — Other Ambulatory Visit: Payer: Self-pay

## 2019-07-14 ENCOUNTER — Ambulatory Visit (INDEPENDENT_AMBULATORY_CARE_PROVIDER_SITE_OTHER): Payer: Medicare Other

## 2019-07-14 DIAGNOSIS — Z23 Encounter for immunization: Secondary | ICD-10-CM | POA: Diagnosis not present

## 2019-07-25 ENCOUNTER — Other Ambulatory Visit: Payer: Self-pay | Admitting: Pulmonary Disease

## 2019-07-25 DIAGNOSIS — R0602 Shortness of breath: Secondary | ICD-10-CM

## 2019-09-06 ENCOUNTER — Other Ambulatory Visit: Payer: Self-pay | Admitting: Neurology

## 2019-09-13 ENCOUNTER — Ambulatory Visit: Payer: Medicare Other | Admitting: *Deleted

## 2019-10-17 ENCOUNTER — Telehealth: Payer: Medicare Other | Admitting: Nurse Practitioner

## 2019-10-17 DIAGNOSIS — H00011 Hordeolum externum right upper eyelid: Secondary | ICD-10-CM | POA: Diagnosis not present

## 2019-10-17 DIAGNOSIS — L03211 Cellulitis of face: Secondary | ICD-10-CM

## 2019-10-17 NOTE — Progress Notes (Signed)
We are sorry that you are not feeling well. Here is how we plan to help!  Based on what you have shared with me it looks like you have a stye.  A stye is an inflammation of the eyelid.  It is often a red, painful lump near the edge of the eyelid that may look like a boil or a pimple.  A stye develops when an infection occurs at the base of an eyelash.   We have made appropriate suggestions for you based upon your presentation: Simple styes can be treated without medical intervention.  Most styes either resolve spontaneously or resolve with simple home treatment by applying warm compresses or heated washcloth to the stye for about 10-15 minutes three to four times a day. This causes the stye to drain and resolve.  HOME CARE:   Wash your hands often!  Let the stye open on its own. Don't squeeze or open it.  Don't rub your eyes. This can irritate your eyes and let in bacteria.  If you need to touch your eyes, wash your hands first.  Don't wear eye makeup or contact lenses until the area has healed.  GET HELP RIGHT AWAY IF:   Your symptoms do not improve.  You develop blurred or loss of vision.  Your symptoms worsen (increased discharge, pain or redness).  Thank you for choosing an e-visit.  Your e-visit answers were reviewed by a board certified advanced clinical practitioner to complete your personal care plan.  Depending upon the condition, your plan could have included both over the counter or prescription medications.  Please review your pharmacy choice.  Make sure the pharmacy is open so you can pick up prescription now.  If there is a problem, you may contact your provider through [employer] and have the prescription routed to another pharmacy.    Your safety is important to [place].  If you have drug allergies check your prescription carefully.  For the next 24 hours you can use MyChart to ask questions about today's visit, request a non-urgent call back, or ask for a work or  school excuse.  You will get an email in the next two days asking about your experience.  I hope you that your e-visit has been valuable and will speed your recovery.   5-10 minutes spent reviewing and documenting in chart.

## 2019-10-21 MED ORDER — SULFAMETHOXAZOLE-TRIMETHOPRIM 800-160 MG PO TABS: 1.0000 | ORAL_TABLET | Freq: Two times a day (BID) | ORAL | 0 refills | Status: DC

## 2019-10-21 NOTE — Addendum Note (Signed)
Addended by: Terald Sleeper on: 10/21/2019 10:11 AM   Modules accepted: Orders

## 2019-10-21 NOTE — Progress Notes (Signed)
E Visit for Cellulitis  We are sorry that you are not feeling well. Here is how we plan to help!  Based on what you shared with me it looks like you have cellulitis.  Cellulitis looks like areas of skin redness, swelling, and warmth; it develops as a result of bacteria entering under the skin. Little red spots and/or bleeding can be seen in skin, and tiny surface sacs containing fluid can occur. Fever can be present. Cellulitis is almost always on one side of a body, and the lower limbs are the most common site of involvement.   I have prescribed:  Bactrim DS 1 tablet by mouth twice a day for 7 days  HOME CARE:  . Take your medications as ordered and take all of them, even if the skin irritation appears to be healing.   GET HELP RIGHT AWAY IF:  . Symptoms that don't begin to go away within 48 hours. . Severe redness persists or worsens . If the area turns color, spreads or swells. . If it blisters and opens, develops yellow-brown crust or bleeds. . You develop a fever or chills. . If the pain increases or becomes unbearable.  . Are unable to keep fluids and food down.  MAKE SURE YOU    Understand these instructions.  Will watch your condition.  Will get help right away if you are not doing well or get worse.  Thank you for choosing an e-visit. Your e-visit answers were reviewed by a board certified advanced clinical practitioner to complete your personal care plan. Depending upon the condition, your plan could have included both over the counter or prescription medications. Please review your pharmacy choice. Make sure the pharmacy is open so you can pick up prescription now. If there is a problem, you may contact your provider through [employer] and have the prescription routed to another pharmacy. Your safety is important to [place]. If you have drug allergies check your prescription carefully.  For the next 24 hours you can use MyChart to ask questions about today's visit,  request a non-urgent call back, or ask for a work or school excuse. You will get an email in the next two days asking about your experience. I hope that your e-visit has been valuable and will speed your recovery.   Particia Nearing PA-C  Approximately 5 minutes was spent documenting and reviewing patient's chart.

## 2019-11-07 ENCOUNTER — Other Ambulatory Visit: Payer: Self-pay | Admitting: Neurology

## 2019-11-07 MED ORDER — PRAMIPEXOLE DIHYDROCHLORIDE ER 2.25 MG PO TB24: 1.0000 | ORAL_TABLET | Freq: Every day | ORAL | 2 refills | Status: DC

## 2019-11-07 NOTE — Telephone Encounter (Signed)
Rx(s) sent to pharmacy electronically. Patients spouse notified directly, per DPR.

## 2019-11-07 NOTE — Telephone Encounter (Signed)
Patient's wife called and left a message requesting a prescription for pramipexole 2.25 MG be sent to a local pharmacy instead of mail order.   She'd like the prescription sent to the [facility] in [place] instead.

## 2019-11-11 ENCOUNTER — Telehealth: Payer: Self-pay | Admitting: Neurology

## 2019-11-11 ENCOUNTER — Other Ambulatory Visit: Payer: Self-pay | Admitting: Neurology

## 2019-11-11 MED ORDER — PRAMIPEXOLE DIHYDROCHLORIDE 0.75 MG PO TABS: 0.7500 mg | ORAL_TABLET | Freq: Three times a day (TID) | ORAL | 1 refills | Status: DC

## 2019-11-11 NOTE — Telephone Encounter (Signed)
Patient notified and voiced understanding. New rx(s) sent to pharmacy electronically.

## 2019-11-11 NOTE — Telephone Encounter (Signed)
Rx(s) sent to pharmacy electronically.  

## 2019-11-11 NOTE — Telephone Encounter (Signed)
There was another like this one earlier today.  The pramipexole ER 2.25 mg is no longer being made so he will need to go to generic pramipexole 0.75 mg tid.  Please let them know and send that to the pharmacy, [id number], 1 RF.

## 2019-11-11 NOTE — Telephone Encounter (Signed)
Patient's wife called left a message stating the patient will run out of pramipexole on Monday, 11/14/19 because no pharmacies have it in [place] within 25 miles..  They do have an old prescription for .5 MG and want to know if that can be used in the meantime?

## 2019-11-11 NOTE — Telephone Encounter (Signed)
What should we do?

## 2019-11-14 ENCOUNTER — Telehealth: Payer: Self-pay | Admitting: Neurology

## 2019-11-14 NOTE — Telephone Encounter (Signed)
Patient's wife called with questions about him taking his Carbidopa Levodopa with other medications. Please Call. Thank you

## 2019-11-15 NOTE — Telephone Encounter (Signed)
Left message for patients spouse to contact the office with her questions.

## 2019-11-15 NOTE — Telephone Encounter (Signed)
Patient wife called back please call

## 2019-11-16 NOTE — Telephone Encounter (Signed)
Together is fine

## 2019-11-16 NOTE — Telephone Encounter (Signed)
Spoke with patiens spouse, per DPR, and she wants to know if the patient can take the Carbidopa-Levodopa and the Pramipexole together or if they need to be taken at separate times? (because they're both taken tid)  Please advise!

## 2019-11-16 NOTE — Telephone Encounter (Signed)
Patients spouse was notified and voiced understanding.

## 2019-11-22 NOTE — Progress Notes (Signed)
[name] [name] was seen today in follow up for Parkinsons disease.  My previous records were reviewed prior to todays visit as well as outside records available to me.  Levodopa was started last visit.  When he first started taking it, he felt clearer mentally and felt that he was able to multitask better.  We also had to change his pramipexole to the nonextended release formulation, because the 2.25 mg was no longer being manufactured.  Pt denies falls but feet seems to be freezing more.  Pt denies lightheadedness, near syncope.  No hallucinations.  Mood has been good.  Last visit, he was complaining about some insomnia, but I told him that I did not want to start 2 medications at once.  He was supposed to call me a few weeks after the visit to let me know if it had still been a problem.  I never heard from him.  Today, he reports that "i'm ready for the medication to help sleep."  [name] [name] out of the bed last night but that was first night.    Current prescribed movement disorder medications: Pramipexole, 0.75 mg 3 times daily Carbidopa/levodopa 25/100, 1 tablet 3 times per day (started last visit)   PREVIOUS MEDICATIONS: Sinemet and Mirapex  ALLERGIES:  No Known Allergies  CURRENT MEDICATIONS:  Outpatient Encounter Medications as of 11/24/2019  Medication Sig  . aspirin 81 MG chewable tablet Chew 81 mg by mouth daily.  . carbidopa-levodopa (SINEMET IR) 25-100 MG tablet TAKE 1 TABLET BY MOUTH THREE TIMES A DAY  . Cholecalciferol (VITAMIN D3) 3000 UNITS TABS Take 1,000 Units by mouth.   . cyanocobalamin 100 MCG tablet Take 1,000 mcg by mouth daily.   . Melatonin 3 MG TABS Take by mouth at bedtime.  . pramipexole (MIRAPEX) 0.75 MG tablet Take 1 tablet (0.75 mg total) by mouth 3 (three) times daily.  . [DISCONTINUED] sulfamethoxazole-trimethoprim (BACTRIM DS) 800-160 MG tablet Take 1 tablet by mouth 2 (two) times daily. (Patient not taking: Reported on 11/24/2019)   No facility-administered  encounter medications on file as of 11/24/2019.    PHYSICAL EXAMINATION:    VITALS:   Vitals:   11/24/19 1248  BP: 117/74  Pulse: 82  SpO2: 97%  Weight: 202 lb (91.6 kg)  Height: 5\' 6"  (1.676 m)   Wt Readings from Last 3 Encounters:  11/24/19 202 lb (91.6 kg)  06/23/19 197 lb 12.8 oz (89.7 kg)  04/12/19 195 lb (88.5 kg)     GEN:  The patient appears stated age and is in NAD. HEENT:  Normocephalic, atraumatic.  The mucous membranes are moist. The superficial temporal arteries are without ropiness or tenderness. CV:  RRR Lungs:  CTAB Neck/HEME:  There are no carotid bruits bilaterally.  Neurological examination:  Orientation: The patient is alert and oriented x3. Cranial nerves: There is good facial symmetry with facial hypomimia. The speech is fluent and clear. Soft palate rises symmetrically and there is no tongue deviation. Hearing is intact to conversational tone. Sensation: Sensation is intact to light touch throughout Motor: Strength is at least antigravity x4.  Movement examination: Tone: There is normal tone in the  Abnormal movements: there was [name] rest tremor that was intermittent Coordination:  There is no decremation with RAM's, with any form of RAMS, including alternating supination and pronation of the forearm, hand opening and closing, finger taps, heel taps and toe taps. Gait and Station: The patient has no difficulty arising out of a deep-seated chair without the  use of the hands. The patient's stride length is good but slightly decreased.     ASSESSMENT/PLAN:  1.  Parkinsons Disease  -Continue pramipexole, 0.75 mg 3 times per day  -Continue carbidopa/levodopa 25/100, 1 tablet 3 times per day  -refer to PT  -Information on exercise programs and support groups was given.  2.  Meige syndrome  -Stable.  -Past dysphagia has been related to this more than Parkinson's disease.  Last modified barium was in January, 2017.  Had mild to moderate dysphagia.   Patient really thinks he is doing well and does not wish to repeat that.  3.  Insomnia and mild rbd  -start klonopin. 0.5 mg, 1/2 po q hs.  R/B/SE were discussed.  The opportunity to ask questions was given and they were answered to the best of my ability.  The patient expressed understanding and willingness to follow the outlined treatment protocols.  Total time spent on today's visit was 30 minutes, including both face-to-face time and nonface-to-face time.  Time included that spent on review of records (prior notes available to me/labs/imaging if pertinent), discussing treatment and goals, answering patient's questions and coordinating care.  Cc:  [name], DO

## 2019-11-24 ENCOUNTER — Encounter: Payer: Self-pay | Admitting: Neurology

## 2019-11-24 ENCOUNTER — Ambulatory Visit (INDEPENDENT_AMBULATORY_CARE_PROVIDER_SITE_OTHER): Payer: Medicare Other | Admitting: Neurology

## 2019-11-24 ENCOUNTER — Other Ambulatory Visit: Payer: Self-pay

## 2019-11-24 VITALS — BP 117/74 | HR 82 | Ht 66.0 in | Wt 202.0 lb

## 2019-11-24 DIAGNOSIS — G20 Parkinson's disease: Secondary | ICD-10-CM | POA: Diagnosis not present

## 2019-11-24 DIAGNOSIS — G4700 Insomnia, unspecified: Secondary | ICD-10-CM | POA: Diagnosis not present

## 2019-11-24 DIAGNOSIS — G4752 REM sleep behavior disorder: Secondary | ICD-10-CM | POA: Diagnosis not present

## 2019-11-24 MED ORDER — CLONAZEPAM 0.5 MG PO TABS: 0.2500 mg | ORAL_TABLET | Freq: Every day | ORAL | 1 refills | Status: DC

## 2019-11-24 NOTE — Patient Instructions (Addendum)
Take klonopin 0.5 mg , 1/2 tablet at bedtime  Exercise!  You have been referred to [facility] for therapy. They will call you directly to schedule an appointment.  Please call [phone] if you do not hear from them.

## 2020-01-05 ENCOUNTER — Other Ambulatory Visit: Payer: Self-pay

## 2020-01-05 ENCOUNTER — Encounter: Payer: Self-pay | Admitting: Physical Therapy

## 2020-01-05 ENCOUNTER — Ambulatory Visit: Payer: Medicare Other | Attending: Neurology | Admitting: Physical Therapy

## 2020-01-05 DIAGNOSIS — R293 Abnormal posture: Secondary | ICD-10-CM | POA: Diagnosis not present

## 2020-01-05 DIAGNOSIS — R29818 Other symptoms and signs involving the nervous system: Secondary | ICD-10-CM | POA: Insufficient documentation

## 2020-01-05 DIAGNOSIS — R2681 Unsteadiness on feet: Secondary | ICD-10-CM | POA: Insufficient documentation

## 2020-01-05 DIAGNOSIS — R278 Other lack of coordination: Secondary | ICD-10-CM

## 2020-01-05 DIAGNOSIS — R2689 Other abnormalities of gait and mobility: Secondary | ICD-10-CM | POA: Diagnosis not present

## 2020-01-05 NOTE — Therapy (Signed)
[facility] [address] [place], [place], [address] Phone: [phone]   Fax:  [phone]  Physical Therapy Evaluation  Patient Details  Name: [name] [name] MRN: [id number] Date of Birth: 07-09-1951 Referring Provider (PT): Wells Guiles, Tat DO   Encounter Date: 01/05/2020  PT End of Session - 01/05/20 1223    Visit Number  1    Number of Visits  9    Date for PT Re-Evaluation  03/05/20    Authorization Type  Medicare    PT Start Time  0850    PT Stop Time  0930    PT Time Calculation (min)  40 min    Equipment Utilized During Treatment  Gait belt    Activity Tolerance  Patient tolerated treatment well    Behavior During Therapy  [facility] for tasks assessed/performed       Past Medical History:  Diagnosis Date  . Cervical lymphadenopathy    right - followed my ENT  . Dystonia 1994   diagnosed in [place]  . Non Hodgkin's lymphoma ([place]) 2007   diffuse- 6 cycles of chemo with R CHOP Rituxan - last dose 10/08  . Parkinson disease ([place])    2015    Past Surgical History:  Procedure Laterality Date  . CHOLECYSTECTOMY, LAPAROSCOPIC    . EYE SURGERY     x2 as child  . PORT-A-CATH REMOVAL    . PORTACATH PLACEMENT    . SPLENECTOMY    . TONSILLECTOMY      There were no vitals filed for this visit.   Subjective Assessment - 01/05/20 0853    Subjective  Dr. Carles Collet thought I could use some help with my balance. Noticed his balance has been getting a little worse, especially this past year. No falls. Sometimes have these moments when he is double stepping, but is able to catch himself. Reports starting to get a lot of stuck feet. Has not been doing anything for exercise. Was doing the weekly class with Amy before coronavirus. Main problem is motivation.    Pertinent History  non-hodgkins lymphoma, PD (diagnosed in 2015), cervical lymphadenopathy    Patient Stated Goals  wants to get some more motivation, wants to "get feet  unstuck",working on balance.    Currently in Pain?  No/denies         [facility] PT Assessment - 01/05/20 0856      Assessment   Medical Diagnosis  PD    Referring Provider (PT)  Wells Guiles, Tat DO    Onset Date/Surgical Date  --   diagnosed in 2015   Hand Dominance  Right    Prior Therapy  has been seen at this location for PT, OT, speech      Balance Screen   Has the patient fallen in the past 6 months  No    Has the patient had a decrease in activity level because of a fear of falling?   No    Is the patient reluctant to leave their home because of a fear of falling?   No      Home Environment   Living Environment  Private residence    Living Arrangements  Spouse/significant other    Type of [place] entrance   wife is in a w/c   Home Layout  Multi-level   bedroom on 2nd, man cave on 3rd floor   Alternate Level Stairs-Number of Steps  12  Alternate Level Stairs-Rails  Can reach both   to 2nd floor, one railing going up to 3rd floor   Home Equipment  Shower seat - built in;Grab bars - tub/shower;Grab bars - toilet    Additional Comments  main caregiver for his wife (but reports his wife remains very independent)      Prior Function   Level of Independence  Independent    Vocation Requirements  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 [place]  likes restoring Dietitian - sometimes it is tough with his tremor, likes to volunteer at [employer]  Appears Intact      Coordination   Gross Motor Movements are Fluid and Coordinated  Yes      Posture/Postural Control   Posture/Postural Control  Postural limitations    Postural Limitations  Forward head;Rounded Shoulders;Flexed trunk;Posterior pelvic tilt      ROM / Strength   AROM / PROM / Strength  Strength      Strength   Strength Assessment Site  Hip;Knee;Ankle    Right/Left Hip  Right;Left    Right Hip Flexion  4+/5    Left Hip Flexion  4+/5    Right/Left Knee   Right;Left    Right Knee Flexion  5/5    Right Knee Extension  5/5    Left Knee Flexion  5/5    Left Knee Extension  5/5    Right/Left Ankle  Right;Left    Right Ankle Dorsiflexion  4+/5    Left Ankle Dorsiflexion  4+/5      Transfers   Transfers  Sit to Stand;Stand to Sit    Sit to Stand  6: Modified independent (Device/Increase time)    Five time sit to stand comments   9.85 seconds from standard height chair with no UE support - poor eccentric control     Stand to Sit  6: Modified independent (Device/Increase time)      Ambulation/Gait   Ambulation/Gait  Yes    Ambulation/Gait Assistance  5: Supervision    Ambulation/Gait Assistance Details  clinic distances throughout eval    Assistive device  None    Gait Pattern  Decreased arm swing - left;Decreased arm swing - right;Step-through pattern;Decreased trunk rotation;Trunk flexed;Poor foot clearance - left;Poor foot clearance - right    Ambulation Surface  Level;Indoor    Gait velocity  12.34 seconds = 2.65 ft/sec      Standardized Balance Assessment   Standardized Balance Assessment  Mini-BESTest;Timed Up and Go Test      Mini-BESTest   Sit To Stand  Normal: Comes to stand without use of hands and stabilizes independently.    Rise to Toes  Moderate: Heels up, but not full range (smaller than when holding hands), OR noticeable instability for 3 s.    Stand on one leg (left)  Moderate: < 20 s   2 seconds both time   Stand on one leg (right)  Moderate: < 20 s   2 and 4 seconds   Stand on one leg - lowest score  1    Compensatory Stepping Correction - Forward  Normal: Recovers independently with a single, large step (second realignement is allowed).    Compensatory Stepping Correction - Backward  Moderate: More than one step is required to recover equilibrium    Compensatory Stepping Correction - Left Lateral  Normal: Recovers independently with 1 step (crossover or lateral OK)  Compensatory Stepping Correction - Right Lateral   Normal: Recovers independently with 1 step (crossover or lateral OK)    Stepping Corredtion Lateral - lowest score  2    Stance - Feet together, eyes open, firm surface   Normal: 30s    Stance - Feet together, eyes closed, foam surface   Normal: 30s    Incline - Eyes Closed  Normal: Stands independently 30s and aligns with gravity    Change in Gait Speed  Normal: Significantly changes walkling speed without imbalance    Walk with head turns - Horizontal  Normal: performs head turns with no change in gait speed and good balance    Walk with pivot turns  Moderate:Turns with feet close SLOW (>4 steps) with good balance.    Step over obstacles  Normal: Able to step over box with minimal change of gait speed and with good balance.    Timed UP & GO with Dual Task  Moderate: Dual Task affects either counting OR walking (>10%) when compared to the TUG without Dual Task.    Mini-BEST total score  23      Timed Up and Go Test   Normal TUG (seconds)  9.69    Manual TUG (seconds)  8.37    Cognitive TUG (seconds)  10.6   counting backwards by 3 at 77   TUG Comments  incr difficulty counting with cog TUG                Objective measurements completed on examination: See above findings.              PT Education - 01/05/20 1223    Education Details  clinical findings, POC, fall risk    Person(s) Educated  Patient    Methods  Explanation    Comprehension  Verbalized understanding       PT Short Term Goals - 01/05/20 1225      PT SHORT TERM GOAL #1   Title  ALL STGS = LTGS        PT Long Term Goals - 01/05/20 1225      PT LONG TERM GOAL #1   Title  Pt will verbalize plans for continued community fitness upon d/c from PT and be independent with Parkinson's specific HEP. ALL LTGS DUE 02/02/20    Time  4    Period  Weeks    Status  New    Target Date  02/02/20      PT LONG TERM GOAL #2   Title  Pt will improve miniBEST score to at least a 25/28 in order to demo decr  fall risk.    Baseline  23 seconds on 01/05/20    Time  4    Period  Weeks    Status  New      PT LONG TERM GOAL #3   Title  Pt will perform at least 5 sit <> stands with proper technique and improved eccentric control in order to demo improved functional BLE strength for transfers.    Time  4    Period  Weeks    Status  New      PT LONG TERM GOAL #4   Title  Pt will improve gait speed to at least 3.0 ft/sec for improved gait efficiency in the community.    Baseline  2.65 ft/sec    Time  4    Period  Weeks    Status  New      PT  LONG TERM GOAL #5   Title  Pt will verbalize understanding of local Parkinson's disease resources.    Time  4    Period  Weeks    Status  New             Plan - 01/05/20 1228    Clinical Impression Statement  Patient is a 69 year old male referred to Neuro OPPT for evaluation with primary concern of imbalance related to PD. Pt's PMH is significant for: non-hodgkins lymphoma, PD (diagnosed in 2015), cervical lymphadenopathy . Pt has last attended therapy at this location back in 2018. Was previously attending weekly PWR moves exercise classes prior to [place]. Since then pt reports he has not been performing any exercise at home, pt reports he finds it difficult to be motivated. The following deficits were present during the exam: impaired eccentric control with sit <> stands, decr posterior stepping strategy balance reactions, impaired SLS, gait abnormalities, decr posture, difficulty with cognitive tasks during gait, decreased B foot clearance during gait. Pt's mini BEST scores indicate pt is at an incr risk for falls. Pt reports he has freezing of gait episodes at times when his feet get "stuck" - none noted today during evaluation. Pt would benefit from skilled PT to address these impairments and functional limitations to maximize functional mobility independence.    Personal Factors and Comorbidities  Comorbidity 3+    Comorbidities  non-hodgkins  lymphoma, PD (diagnosed in 2015), cervical lymphadenopathy    Examination-Activity Limitations  Stairs;Locomotion Level    Examination-Participation Restrictions  Community Activity   exercise classes (due to covid)   Stability/Clinical Decision Making  Stable/Uncomplicated    Clinical Decision Making  Low    Rehab Potential  Excellent    PT Frequency  2x / week    PT Duration  4 weeks    PT Treatment/Interventions  ADLs/Self Care Home Management;Gait training;Stair training;Functional mobility training;Therapeutic activities;Patient/family education;Neuromuscular re-education;Balance training;Therapeutic exercise    PT Next Visit Plan  assess stairs, education for freezing of gait, initial HEP - standing PWR moves (espescially focus on posture), strategies for improved foot clearance, provide local PD resources       Patient will benefit from skilled therapeutic intervention in order to improve the following deficits and impairments:  Abnormal gait, Decreased balance, Difficulty walking, Decreased strength, Postural dysfunction  Visit Diagnosis: Other lack of coordination  Other symptoms and signs involving the nervous system  Abnormal posture  Unsteadiness on feet     Problem List Patient Active Problem List   Diagnosis Date Noted  . Localized swelling of right lower extremity 04/12/2019  . DOE (dyspnea on exertion) 07/15/2018  . 10 year risk of MI or stroke 7.5% or greater 04/12/2018  . 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 Dysphagia, neurologic 07/27/2014  . Parkinson disease ([place]) 07/27/2014  . Dystonia 07/10/2014  . Low back pain 11/21/2011  . TRANSAMINASES, SERUM, ELEVATED 07/06/2008  . DIARRHEA 05/23/2008  . LYMPH NODE-ENLARGED 07/28/2007  . Non-Hodgkin lymphoma ([place]) 03/23/2007    [name] [name], PT, DPT  01/05/2020, 12:34 PM  [facility] [address] [place], [place], [address] Phone: [phone]   Fax:  [phone]  Name: ADWIN CARRAHER MRN: [id number] Date of Birth: 10-Aug-1951

## 2020-01-09 ENCOUNTER — Other Ambulatory Visit: Payer: Self-pay

## 2020-01-09 ENCOUNTER — Ambulatory Visit: Payer: Medicare Other | Admitting: Physical Therapy

## 2020-01-09 DIAGNOSIS — R293 Abnormal posture: Secondary | ICD-10-CM

## 2020-01-09 DIAGNOSIS — R2681 Unsteadiness on feet: Secondary | ICD-10-CM

## 2020-01-09 DIAGNOSIS — R29818 Other symptoms and signs involving the nervous system: Secondary | ICD-10-CM

## 2020-01-09 DIAGNOSIS — R2689 Other abnormalities of gait and mobility: Secondary | ICD-10-CM | POA: Diagnosis not present

## 2020-01-09 DIAGNOSIS — R278 Other lack of coordination: Secondary | ICD-10-CM

## 2020-01-09 NOTE — Patient Instructions (Addendum)
    x10 reps, 1-2 times daily    Tips to reduce freezing episodes with standing or walking:   1. Stand tall with your feet wide, so that you can rock and weight shift through your hips. 2. Don't try to fight the freeze: if you begin taking slower, faster, smaller steps, STOP, get your posture tall, and RESET your posture and balance.  Take a deep breath before taking the BIG step to start again. 3. March in place, with high knee stepping, to get started walking again. 4. Use auditory cues:  Count out loud, think of a familiar tune or song or cadence, use pocket metronome, to use rhythm to get started walking again. 5. Use visual cues:  Use a line to step over, use laser pointer line to step over, (using BIG steps) to start walking again. 6. Use visual targets to keep your posture tall (look ahead and focus on an object or target at eye level). 7. As you approach where your destination with walking, count your steps out loud and/or focus on your target with your eyes until you are fully there. Use appropriate assistive device, as advised by your physical therapist to assist with taking longer, consistent steps.    Access Code: [email or web address] URL: [email or web address] Date: 01/09/2020 Prepared by: Janann August  Exercises Alternating Backward Step - 2 x daily - 7 x weekly - 1 sets - 10 reps

## 2020-01-09 NOTE — Therapy (Signed)
[facility] [address] [place], [place], [address] Phone: [phone]   Fax:  [phone]  Physical Therapy Treatment  Patient Details  Name: [name] [name] MRN: [id number] Date of Birth: [birth year] Referring Provider (PT): Wells Guiles, Tat DO   Encounter Date: 01/09/2020  PT End of Session - 01/09/20 0935    Visit Number  2    Number of Visits  9    Date for PT Re-Evaluation  03/05/20    Authorization Type  Medicare    PT Start Time  0806   pt arrived late   PT Stop Time  0845    PT Time Calculation (min)  39 min    Equipment Utilized During Treatment  Gait belt    Activity Tolerance  Patient tolerated treatment well    Behavior During Therapy  [facility] for tasks assessed/performed       Past Medical History:  Diagnosis Date  . Cervical lymphadenopathy    right - followed my ENT  . Dystonia 1994   diagnosed in [place]  . Non Hodgkin's lymphoma ([place]) 2007   diffuse- 6 cycles of chemo with R CHOP Rituxan - last dose 10/08  . Parkinson disease ([place])    2015    Past Surgical History:  Procedure Laterality Date  . CHOLECYSTECTOMY, LAPAROSCOPIC    . EYE SURGERY     x2 as child  . PORT-A-CATH REMOVAL    . PORTACATH PLACEMENT    . SPLENECTOMY    . TONSILLECTOMY      There were no vitals filed for this visit.  Subjective Assessment - 01/09/20 0808    Subjective  Has had a couple episodes where he felt like he got stuck - tried shifting his weight side to side and taking a big step and that helped a lot. No falls.    Pertinent History  non-hodgkins lymphoma, PD (diagnosed in 2015), cervical lymphadenopathy    Patient Stated Goals  wants to get some more motivation, wants to "get feet unstuck",working on balance.    Currently in Pain?  No/denies               Pt performs PWR! Moves in standing position 2 x 10 reps each    PWR! Up for improved posture   PWR! Rock for improved weighshifting - cues to  look up at hand and hold weight shift position   PWR! Twist for improved trunk rotation -  Cues to slow down   PWR! Step for improved step initiation  - first 10 reps performed stepping laterally over black foam beam for incr foot clearance as during first initial reps pt demonstrating incr foot scuffing   Cues provided for intensity and slowing movement down at times.   Provided as HEP with instructions to perform 1-2x daily.         [facility] PT Treatment/Exercise - 01/09/20 0001      Transfers   Transfers  Sit to Stand;Stand to Sit    Sit to Stand  6: Modified independent (Device/Increase time)    Comments  x6 reps with cues for eccentric control and for tall posture once in standing      Ambulation/Gait   Ambulation/Gait  Yes    Ambulation/Gait Assistance  5: Supervision    Ambulation/Gait Assistance Details  cues for posture, step length, and big arm swing (noted decr L>R arm swing)     Ambulation Distance (Feet)  230 Feet    Assistive device  None    Gait Pattern  Decreased arm swing - left;Decreased arm swing - right;Step-through pattern;Decreased trunk rotation;Trunk flexed;Poor foot clearance - left;Poor foot clearance - right    Ambulation Surface  Level;Indoor    Stairs  Yes    Stairs Assistance  6: Modified independent (Device/Increase time)    Stair Management Technique  No rails;Alternating pattern;Forwards    Number of Stairs  4    Height of Stairs  6      Therapeutic Activites    Therapeutic Activities  Other Therapeutic Activities    Other Therapeutic Activities  provided pt with handout on additional strategies to decr freezing episodes (verbally educated pt on eval on weight shifting and BIG step strategy) - pt verbalizing understanding       Neuro Re-ed    Neuro Re-ed Details   Standing with alternating scarf toss B for posture - with incr intensity and amplitude of movement with cues to toss as high as pt can and to open up hands to catch and pull back  (for incr shoulder ROM and to help with arm swing) x10 reps, progressing to step toss with alternating forward steps for stepping strategy and cues for incr foot clearance x 8 reps B        Initiated HEP in addition to standing PWR! Moves:    Access Code: [email or web address] URL: [email or web address] Date: 01/09/2020 Prepared by: Janann August  Exercises Alternating Backward Step - 2 x daily - 7 x weekly - 1 sets - 10 reps     PT Education - 01/09/20 0935    Education Details  initial HEP, education for freezing of gait (provided handout)    Person(s) Educated  Patient    Methods  Explanation;Demonstration;Handout    Comprehension  Verbalized understanding;Returned demonstration       PT Short Term Goals - 01/05/20 1225      PT SHORT TERM GOAL #1   Title  ALL STGS = LTGS        PT Long Term Goals - 01/05/20 1225      PT LONG TERM GOAL #1   Title  Pt will verbalize plans for continued community fitness upon d/c from PT and be independent with Parkinson's specific HEP. ALL LTGS DUE 02/02/20    Time  4    Period  Weeks    Status  New    Target Date  02/02/20      PT LONG TERM GOAL #2   Title  Pt will improve miniBEST score to at least a 25/28 in order to demo decr fall risk.    Baseline  23 seconds on 01/05/20    Time  4    Period  Weeks    Status  New      PT LONG TERM GOAL #3   Title  Pt will perform at least 5 sit <> stands with proper technique and improved eccentric control in order to demo improved functional BLE strength for transfers.    Time  4    Period  Weeks    Status  New      PT LONG TERM GOAL #4   Title  Pt will improve gait speed to at least 3.0 ft/sec for improved gait efficiency in the community.    Baseline  2.65 ft/sec    Time  4    Period  Weeks    Status  New      PT LONG TERM GOAL #5   Title  Pt  will verbalize understanding of local Parkinson's disease resources.    Time  4    Period  Weeks    Status  New             Plan - 01/09/20 1214    Clinical Impression Statement  Focus of today's skilled session was initiating HEP - with standing PWR moves and alternating posterior stepping strategy. Pt needing cues at times to slow down movement with PWR moves. Pt did well with external visual cue for foot clearance with PWR! step. Remainder of session focused on providing educational handout to decr freezing episodes and NMR for posture/step initiation. Will continue to progress towards LTGs.    Personal Factors and Comorbidities  Comorbidity 3+    Comorbidities  non-hodgkins lymphoma, PD (diagnosed in 2015), cervical lymphadenopathy    Examination-Activity Limitations  Stairs;Locomotion Level    Examination-Participation Restrictions  Community Activity   exercise classes (due to covid)   Stability/Clinical Decision Making  Stable/Uncomplicated    Rehab Potential  Excellent    PT Frequency  2x / week    PT Duration  4 weeks    PT Treatment/Interventions  ADLs/Self Care Home Management;Gait training;Stair training;Functional mobility training;Therapeutic activities;Patient/family education;Neuromuscular re-education;Balance training;Therapeutic exercise    PT Next Visit Plan  how was HEP? PWR! standing moves flow, multi-directional stepping activities, exercises for posture, gait training with incr trunk rotation/arm swing, strategies for improved foot clearance, provide local PD resources       Patient will benefit from skilled therapeutic intervention in order to improve the following deficits and impairments:  Abnormal gait, Decreased balance, Difficulty walking, Decreased strength, Postural dysfunction  Visit Diagnosis: Other lack of coordination  Other symptoms and signs involving the nervous system  Abnormal posture  Unsteadiness on feet     Problem List Patient Active Problem List   Diagnosis Date Noted  . Localized swelling of right lower extremity 04/12/2019  . DOE (dyspnea on  exertion) 07/15/2018  . 10 year risk of MI or stroke 7.5% or greater 04/12/2018  . 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 Dysphagia, neurologic 07/27/2014  . Parkinson disease ([place]) 07/27/2014  . Dystonia 07/10/2014  . Low back pain 11/21/2011  . TRANSAMINASES, SERUM, ELEVATED 07/06/2008  . DIARRHEA 05/23/2008  . LYMPH NODE-ENLARGED 07/28/2007  . Non-Hodgkin lymphoma ([place]) 03/23/2007    Arliss Journey, PT, DPT  01/09/2020, 12:18 PM  [facility] [address] [place], [place], [address] Phone: [phone]   Fax:  [phone]  Name: [name] [name] MRN: [id number] Date of Birth: 07-03-1951

## 2020-01-17 ENCOUNTER — Ambulatory Visit: Payer: Medicare Other | Admitting: Physical Therapy

## 2020-01-17 ENCOUNTER — Encounter: Payer: Self-pay | Admitting: Physical Therapy

## 2020-01-17 ENCOUNTER — Other Ambulatory Visit: Payer: Self-pay

## 2020-01-17 DIAGNOSIS — R2689 Other abnormalities of gait and mobility: Secondary | ICD-10-CM | POA: Diagnosis not present

## 2020-01-17 DIAGNOSIS — R293 Abnormal posture: Secondary | ICD-10-CM

## 2020-01-17 DIAGNOSIS — R2681 Unsteadiness on feet: Secondary | ICD-10-CM | POA: Diagnosis not present

## 2020-01-17 DIAGNOSIS — R29818 Other symptoms and signs involving the nervous system: Secondary | ICD-10-CM | POA: Diagnosis not present

## 2020-01-17 DIAGNOSIS — R278 Other lack of coordination: Secondary | ICD-10-CM | POA: Diagnosis not present

## 2020-01-17 NOTE — Therapy (Signed)
[facility] [address] [place], [place], [address] Phone: [phone]   Fax:  [phone]  Physical Therapy Treatment  Patient Details  Name: [name] [name] MRN: [id number] Date of Birth: 08/26/1951 Referring Provider (PT): Wells Guiles, Tat DO   Encounter Date: 01/17/2020  PT End of Session - 01/17/20 1340    Visit Number  3    Number of Visits  9    Date for PT Re-Evaluation  03/05/20    Authorization Type  Medicare    PT Start Time  0804    PT Stop Time  0848    PT Time Calculation (min)  44 min    Equipment Utilized During Treatment  Gait belt    Activity Tolerance  Patient tolerated treatment well    Behavior During Therapy  [facility] for tasks assessed/performed       Past Medical History:  Diagnosis Date  . Cervical lymphadenopathy    right - followed my ENT  . Dystonia 1994   diagnosed in [place]  . Non Hodgkin's lymphoma ([place]) 2007   diffuse- 6 cycles of chemo with R CHOP Rituxan - last dose 10/08  . Parkinson disease ([place])    2015    Past Surgical History:  Procedure Laterality Date  . CHOLECYSTECTOMY, LAPAROSCOPIC    . EYE SURGERY     x2 as child  . PORT-A-CATH REMOVAL    . PORTACATH PLACEMENT    . SPLENECTOMY    . TONSILLECTOMY      There were no vitals filed for this visit.  Subjective Assessment - 01/17/20 0806    Subjective  Have really missed seeing you with the exercises.  Had to have a root canal yesterday.  Tips to get "unstuck" have been very successful.    Pertinent History  non-hodgkins lymphoma, PD (diagnosed in 2015), cervical lymphadenopathy    Patient Stated Goals  wants to get some more motivation, wants to "get feet unstuck",working on balance.    Currently in Pain?  Other (Comment)   Root canal yesterday           Neuro Re-education:   PWR! Moves in standing:  Review of HEP from last visit PWR! Up for posture x 10 reps (2 reps for warm-up) PWR! Rock for  weightshifting x 10 reps each side PWR! Twist for trunk rotation x 10 reps each side PWR! Step for step initiation x 10 reps each side  Pt performs with correct technique, just occasional cues for slowed pace, increased amplitude of movement patterns.  Followed with progression to: PWR! Moves Flow in standing, x 5 reps with minimal verbal and visual cues. PWR! Up>PWR! Rock>PWR! Twist>PWR! Step  At counter:  Pt performs multi-directional stepping, (forward/side/back) according to chart that PT is reading out loud.  Added obstacle on side and in front to assist with step height and step length; performed x 4 sets.           [facility] Adult PT Treatment/Exercise - 01/17/20 0001      Ambulation/Gait   Ambulation/Gait  Yes    Ambulation/Gait Assistance  5: Supervision    Ambulation/Gait Assistance Details  Cues for posture, arm swing, step length.  Discussed increased intensity with walking to continue focus on large movement patterns, with pt rating intensity/effort level as 6-7/10    Ambulation Distance (Feet)  460 Feet    Assistive device  None    Gait Pattern  Decreased arm swing - left;Decreased arm swing - right;Step-through  pattern;Decreased trunk rotation;Trunk flexed;Poor foot clearance - left;Poor foot clearance - right    Ambulation Surface  Level;Indoor    Gait Comments  Initiated walking program for home, discussing starting at 2-3 minutes, 2-3 times per day (indoors) and gradually increasing to longer distances and outdoor gait.             PT Education - 01/17/20 1340    Education Details  Walking program initiated    [employer]) Educated  Patient    Methods  Explanation;Demonstration;Handout    Comprehension  Verbalized understanding       PT Short Term Goals - 01/05/20 1225      PT SHORT TERM GOAL #1   Title  ALL STGS = LTGS        PT Long Term Goals - 01/05/20 1225      PT LONG TERM GOAL #1   Title  Pt will verbalize plans for continued community  fitness upon d/c from PT and be independent with Parkinson's specific HEP. ALL LTGS DUE 02/02/20    Time  4    Period  Weeks    Status  New    Target Date  02/02/20      PT LONG TERM GOAL #2   Title  Pt will improve miniBEST score to at least a 25/28 in order to demo decr fall risk.    Baseline  23 seconds on 01/05/20    Time  4    Period  Weeks    Status  New      PT LONG TERM GOAL #3   Title  Pt will perform at least 5 sit <> stands with proper technique and improved eccentric control in order to demo improved functional BLE strength for transfers.    Time  4    Period  Weeks    Status  New      PT LONG TERM GOAL #4   Title  Pt will improve gait speed to at least 3.0 ft/sec for improved gait efficiency in the community.    Baseline  2.65 ft/sec    Time  4    Period  Weeks    Status  New      PT LONG TERM GOAL #5   Title  Pt will verbalize understanding of local Parkinson's disease resources.    Time  4    Period  Weeks    Status  New            Plan - 01/17/20 1341    Clinical Impression Statement  Focus of skilled PT session today was review of PWR! Moves in standing as well as progression to standing PWR! Moves Flow and to multi-directional stepping.  He needs cues occasionally for slowed pace and to increase foot clearance/step length.  Also worked on Personnel officer for arm swing and step length/posture with gait, with education on initiation of walking program.  Pt will continue to benefit from furhter skilled PT to address large movement patterns for balance and gait.    Personal Factors and Comorbidities  Comorbidity 3+    Comorbidities  non-hodgkins lymphoma, PD (diagnosed in 2015), cervical lymphadenopathy    Examination-Activity Limitations  Stairs;Locomotion Level    Examination-Participation Restrictions  Community Activity   exercise classes (due to covid)   Stability/Clinical Decision Making  Stable/Uncomplicated    Rehab Potential  Excellent    PT  Frequency  2x / week    PT Duration  4 weeks    PT  Treatment/Interventions  ADLs/Self Care Home Management;Gait training;Stair training;Functional mobility training;Therapeutic activities;Patient/family education;Neuromuscular re-education;Balance training;Therapeutic exercise    PT Next Visit Plan  How did walking program go?  PWR! standing moves flow (add as HEP), multi-directional stepping activities, exercises for posture, gait training with incr trunk rotation/arm swing, strategies for improved foot clearance, provide local PD resources    Consulted and Agree with Plan of Care  Patient       Patient will benefit from skilled therapeutic intervention in order to improve the following deficits and impairments:  Abnormal gait, Decreased balance, Difficulty walking, Decreased strength, Postural dysfunction  Visit Diagnosis: Other abnormalities of gait and mobility  Unsteadiness on feet  Abnormal posture     Problem List Patient Active Problem List   Diagnosis Date Noted  . Localized swelling of right lower extremity 04/12/2019  . DOE (dyspnea on exertion) 07/15/2018  . 10 year risk of MI or stroke 7.5% or greater 04/12/2018  . 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 Dysphagia, neurologic 07/27/2014  . Parkinson disease ([place]) 07/27/2014  . Dystonia 07/10/2014  . Low back pain 11/21/2011  . TRANSAMINASES, SERUM, ELEVATED 07/06/2008  . DIARRHEA 05/23/2008  . LYMPH NODE-ENLARGED 07/28/2007  . Non-Hodgkin lymphoma ([place]) 03/23/2007    Syler Norcia W. 01/17/2020, 1:44 PM  Frazier Butt., PT   [facility] [address] [place], [place], [address] Phone: [phone]   Fax:  [phone]  Name: [name] [name] MRN: [id number] Date of Birth: 03-Mar-1951

## 2020-01-17 NOTE — Patient Instructions (Signed)
WALKING  Walking is a great form of exercise to increase your strength, endurance and overall fitness.  A walking program can help you start slowly and gradually build endurance as you go.  Everyone's ability is different, so each person's starting point will be different.  You do not have to follow them exactly.  The are just samples. You should simply find out what's right for you and stick to that program.   In the beginning, you'll start off walking 2-3 times a day for short distances.  As you get stronger, you'll be walking further at just 1-2 times per day.  A. You Can Walk For A Certain Length Of Time Each Day    Walk 2-3 minutes 2-3 times per day.  Increase 1 minutes every 3 days (3 times per day).  Work up to 10-15 minutes (1-2 times per day).   Example:   Day 1-2 2-3 minutes 3 times per day   Day 7-8 5-6 minutes 2-3 times per day   Day 13-14 8-10 minutes 1-2 times per day

## 2020-01-20 ENCOUNTER — Other Ambulatory Visit: Payer: Self-pay

## 2020-01-20 ENCOUNTER — Ambulatory Visit: Payer: Medicare Other | Admitting: Physical Therapy

## 2020-01-20 DIAGNOSIS — R293 Abnormal posture: Secondary | ICD-10-CM

## 2020-01-20 DIAGNOSIS — R2689 Other abnormalities of gait and mobility: Secondary | ICD-10-CM

## 2020-01-20 DIAGNOSIS — R29818 Other symptoms and signs involving the nervous system: Secondary | ICD-10-CM

## 2020-01-20 DIAGNOSIS — R2681 Unsteadiness on feet: Secondary | ICD-10-CM | POA: Diagnosis not present

## 2020-01-20 DIAGNOSIS — R278 Other lack of coordination: Secondary | ICD-10-CM | POA: Diagnosis not present

## 2020-01-20 NOTE — Therapy (Signed)
[facility] [address] [place], [place], [address] Phone: [phone]   Fax:  [phone]  Physical Therapy Treatment  Patient Details  Name: [name] [name] MRN: [id number] Date of Birth: 03-22-1951 Referring Provider (PT): Wells Guiles, Tat DO   Encounter Date: 01/20/2020  PT End of Session - 01/20/20 1143    Visit Number  4    Number of Visits  9    Date for PT Re-Evaluation  03/05/20    Authorization Type  Medicare    PT Start Time  [id number]   pt arrived late - therapist still able to see due to not having a pt in next slot   PT Stop Time  1128    PT Time Calculation (min)  43 min    Activity Tolerance  Patient tolerated treatment well    Behavior During Therapy  [facility] for tasks assessed/performed       Past Medical History:  Diagnosis Date  . Cervical lymphadenopathy    right - followed my ENT  . Dystonia 1994   diagnosed in [place]  . Non Hodgkin's lymphoma ([place]) 2007   diffuse- 6 cycles of chemo with R CHOP Rituxan - last dose 10/08  . Parkinson disease ([place])    2015    Past Surgical History:  Procedure Laterality Date  . CHOLECYSTECTOMY, LAPAROSCOPIC    . EYE SURGERY     x2 as child  . PORT-A-CATH REMOVAL    . PORTACATH PLACEMENT    . SPLENECTOMY    . TONSILLECTOMY      There were no vitals filed for this visit.  Subjective Assessment - 01/20/20 1045    Subjective  No changes. Exercises are going well. Started his walking program - haven't gotten very far [place]. Has started walking for about 10 minutes.    Pertinent History  non-hodgkins lymphoma, PD (diagnosed in 2015), cervical lymphadenopathy    Patient Stated Goals  wants to get some more motivation, wants to "get feet unstuck",working on balance.    Currently in Pain?  No/denies              NMR:   PWR! Moves Flow in standing, x 7 reps with minimal verbal and visual cues - performed in front of  mirror - initiated as HEP . PWR! Up>PWR! Rock>PWR! Twist>PWR! StepPWR!  With 3 colored discs on floor in semi circle towards R and L -therapist calling out directions for multi-directional forward and lateral stepping, adding a sequence of steps, and then progressing to pt naming a food of the letter that the color starts with   -alternating SLS taps to cones for increased step height 1 x 8 reps B, cues for technique and tapping cone slowly for incr SLS    [facility] Adult PT Treatment/Exercise - 01/20/20 0001      Ambulation/Gait   Ambulation/Gait  Yes    Ambulation/Gait Assistance  5: Supervision    Ambulation/Gait Assistance Details  cues for posture, arm swing, and intensity. needed a standing rest break outdoors due to fatigue before continuing with gait    Ambulation Distance (Feet)  700 Feet   needing a standing rest break with outdoor activity    Assistive device  None    Gait Pattern  Decreased arm swing - left;Decreased arm swing - right;Step-through pattern;Decreased trunk rotation;Trunk flexed    Ambulation Surface  Level;Indoor;Outdoor;Paved;Grass      Exercises   Exercises  Other Exercises  Other Exercises   SciFit with BLE and BUE for strenthening, ROM, activity tolerance, recirpocal movement x5 minutes at gear 2.0                PT Short Term Goals - 01/05/20 1225      PT SHORT TERM GOAL #1   Title  ALL STGS = LTGS        PT Long Term Goals - 01/20/20 1140      PT LONG TERM GOAL #1   Title  Pt will verbalize plans for continued community fitness upon d/c from PT and be independent with Parkinson's specific HEP. ALL LTGS DUE 02/02/20    Time  4    Period  Weeks    Status  New      PT LONG TERM GOAL #2   Title  Pt will improve miniBEST score to at least a 25/28 in order to demo decr fall risk.    Baseline  23/28    Time  4    Period  Weeks    Status  New      PT LONG TERM GOAL #3   Title  Pt will perform at least 5 sit <> stands with proper  technique and improved eccentric control in order to demo improved functional BLE strength for transfers.    Time  4    Period  Weeks    Status  New      PT LONG TERM GOAL #4   Title  Pt will improve gait speed to at least 3.0 ft/sec for improved gait efficiency in the community.    Baseline  2.65 ft/sec    Time  4    Period  Weeks    Status  New      PT LONG TERM GOAL #5   Title  Pt will verbalize understanding of local Parkinson's disease resources.    Time  4    Period  Weeks    Status  New            Plan - 01/20/20 1141    Clinical Impression Statement  Initiated PWR! moves flow for HEP - needed cues for increased foot clearance/step length with step initation. Reviewed gait training with increased intensity and practiced gait outdoors - pt needing a standing rest break outdoors due to fatigue before continuing back indoors. Pt did well with additional cognitive challenge during standing multi-directional forward/lateral stepping today. Will continue to progress towards LTGs.    Personal Factors and Comorbidities  Comorbidity 3+    Comorbidities  non-hodgkins lymphoma, PD (diagnosed in 2015), cervical lymphadenopathy    Examination-Activity Limitations  Stairs;Locomotion Level    Examination-Participation Restrictions  Community Activity   exercise classes (due to covid)   Stability/Clinical Decision Making  Stable/Uncomplicated    Rehab Potential  Excellent    PT Frequency  2x / week    PT Duration  4 weeks    PT Treatment/Interventions  ADLs/Self Care Home Management;Gait training;Stair training;Functional mobility training;Therapeutic activities;Patient/family education;Neuromuscular re-education;Balance training;Therapeutic exercise    PT Next Visit Plan  how is PWR moves flow? maybe try quadraped or additional PWR moves? practice gait with big turns/turning.  multi-directional stepping activities, exercises for posture, gait training with incr trunk rotation/arm swing,  strategies for improved foot clearance, provide local PD resources    Consulted and Agree with Plan of Care  Patient       Patient will benefit from skilled therapeutic intervention in order to improve the following  deficits and impairments:  Abnormal gait, Decreased balance, Difficulty walking, Decreased strength, Postural dysfunction  Visit Diagnosis: Unsteadiness on feet  Other abnormalities of gait and mobility  Abnormal posture  Other lack of coordination  Other symptoms and signs involving the nervous system     Problem List Patient Active Problem List   Diagnosis Date Noted  . Localized swelling of right lower extremity 04/12/2019  . DOE (dyspnea on exertion) 07/15/2018  . 10 year risk of MI or stroke 7.5% or greater 04/12/2018  . 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 Dysphagia, neurologic 07/27/2014  . Parkinson disease ([place]) 07/27/2014  . Dystonia 07/10/2014  . Low back pain 11/21/2011  . TRANSAMINASES, SERUM, ELEVATED 07/06/2008  . DIARRHEA 05/23/2008  . LYMPH NODE-ENLARGED 07/28/2007  . Non-Hodgkin lymphoma ([place]) 03/23/2007    Arliss Journey, PT, DPT  01/20/2020, 11:45 AM  [facility] [address] [place], [place], [address] Phone: [phone]   Fax:  [phone]  Name: AMY USHER MRN: [id number] Date of Birth: [birth year]

## 2020-01-24 ENCOUNTER — Other Ambulatory Visit: Payer: Self-pay

## 2020-01-24 ENCOUNTER — Ambulatory Visit: Payer: Medicare Other | Attending: Neurology | Admitting: Physical Therapy

## 2020-01-24 DIAGNOSIS — R2689 Other abnormalities of gait and mobility: Secondary | ICD-10-CM | POA: Diagnosis not present

## 2020-01-24 DIAGNOSIS — R278 Other lack of coordination: Secondary | ICD-10-CM | POA: Diagnosis not present

## 2020-01-24 DIAGNOSIS — R293 Abnormal posture: Secondary | ICD-10-CM | POA: Insufficient documentation

## 2020-01-24 DIAGNOSIS — R2681 Unsteadiness on feet: Secondary | ICD-10-CM

## 2020-01-24 NOTE — Therapy (Signed)
[facility] [address] [place], [place], [address] Phone: [phone]   Fax:  [phone]  Physical Therapy Treatment  Patient Details  Name: PACE POITIER MRN: [id number] Date of Birth: 1951-03-06 Referring Provider (PT): Wells Guiles, Tat DO   Encounter Date: 01/24/2020  PT End of Session - 01/24/20 1109    Visit Number  5    Number of Visits  9    Date for PT Re-Evaluation  03/05/20    Authorization Type  Medicare    PT Start Time  1019    PT Stop Time  1059    PT Time Calculation (min)  40 min    Equipment Utilized During Treatment  Gait belt    Activity Tolerance  Patient tolerated treatment well    Behavior During Therapy  [facility] for tasks assessed/performed       Past Medical History:  Diagnosis Date  . Cervical lymphadenopathy    right - followed my ENT  . Dystonia 1994   diagnosed in [place]  . Non Hodgkin's lymphoma ([place]) 2007   diffuse- 6 cycles of chemo with R CHOP Rituxan - last dose 10/08  . Parkinson disease ([place])    2015    Past Surgical History:  Procedure Laterality Date  . CHOLECYSTECTOMY, LAPAROSCOPIC    . EYE SURGERY     x2 as child  . PORT-A-CATH REMOVAL    . PORTACATH PLACEMENT    . SPLENECTOMY    . TONSILLECTOMY      There were no vitals filed for this visit.  Subjective Assessment - 01/24/20 1020    Subjective  Did quite a bit of walking yesterday - did 20 minutes of walking in the neighborhood. Felt tired afterwards.    Pertinent History  non-hodgkins lymphoma, PD (diagnosed in 2015), cervical lymphadenopathy    Patient Stated Goals  wants to get some more motivation, wants to "get feet unstuck",working on balance.    Currently in Pain?  --   tooth is a little bothersome for root canal last week                NMR:  -PWR! Moves Flowin standing, x 5 reps with minimal verbal and visual cues - performed on red mat for compliant surface PWR! Up>PWR! Rock>PWR!  Twist>PWR! StepPWR!   And then an additional x3 reps in reverse order.   -Standing PWR moves on blue foam beam at edge of mat: PWR up! x5 reps and then PWR! Rock for weight shift x5 reps B, needing min guard/min A posteriorly for PWR! Rock due to pt almost losing balance posteriorly   -Multi-directional stepping on red mat stepping over obstacles - use of foam beams anteriorly and laterally for increased foot clearance, multiple reps progressing from single step to multi-step commands         [facility] Adult PT Treatment/Exercise - 01/24/20 0001      Ambulation/Gait   Ambulation/Gait  Yes    Ambulation/Gait Assistance  5: Supervision    Ambulation/Gait Assistance Details  cues for increased intensity and performing wide turns with reicprocal arm swing and big steps    Ambulation Distance (Feet)  115 Feet    Assistive device  None    Gait Pattern  Decreased arm swing - left;Decreased arm swing - right;Step-through pattern;Decreased trunk rotation;Trunk flexed    Ambulation Surface  Indoor;Level          Balance Exercises - 01/24/20 1407      Balance  Exercises: Standing   Stepping Strategy  Anterior;Posterior;Foam/compliant surface;Limitations    Stepping Strategy Limitations  on foam beam, occassional min guard for posterior stepping     Rockerboard  Anterior/posterior;Limitations    Rockerboard Limitations  x10 reps A/P weight shift with cues for hip strategy esp in posterior direction, adding in reiprocal arm swing with min guard    Other Standing Exercises Comments  standing on blue foam beam - alternating marching progressing with alternating UE lift, cues for foot clearance and performing more slowly x10 reps           PT Short Term Goals - 01/05/20 1225      PT SHORT TERM GOAL #1   Title  ALL STGS = LTGS        PT Long Term Goals - 01/20/20 1140      PT LONG TERM GOAL #1   Title  Pt will verbalize plans for continued community fitness upon d/c from PT and be  independent with Parkinson's specific HEP. ALL LTGS DUE 02/02/20    Time  4    Period  Weeks    Status  New      PT LONG TERM GOAL #2   Title  Pt will improve miniBEST score to at least a 25/28 in order to demo decr fall risk.    Baseline  23/28    Time  4    Period  Weeks    Status  New      PT LONG TERM GOAL #3   Title  Pt will perform at least 5 sit <> stands with proper technique and improved eccentric control in order to demo improved functional BLE strength for transfers.    Time  4    Period  Weeks    Status  New      PT LONG TERM GOAL #4   Title  Pt will improve gait speed to at least 3.0 ft/sec for improved gait efficiency in the community.    Baseline  2.65 ft/sec    Time  4    Period  Weeks    Status  New      PT LONG TERM GOAL #5   Title  Pt will verbalize understanding of local Parkinson's disease resources.    Time  4    Period  Weeks    Status  New            Plan - 01/24/20 1409    Clinical Impression Statement  Pt able to perform standing PWR moves flow today with minimal verbal and demonstrative cues. Pt particularly challenged by standing PWR up and rock while performing on blue foam beam, needing min guard at times due to almost losing balance posteriorly. Intermittent brief rest breaks taken due to fatigue, esp after 10 reps of standing PWR flow. Will continue to progress towards LTGs.    Personal Factors and Comorbidities  Comorbidity 3+    Comorbidities  non-hodgkins lymphoma, PD (diagnosed in 2015), cervical lymphadenopathy    Examination-Activity Limitations  Stairs;Locomotion Level    Examination-Participation Restrictions  Community Activity   exercise classes (due to covid)   Stability/Clinical Decision Making  Stable/Uncomplicated    Rehab Potential  Excellent    PT Frequency  2x / week    PT Duration  4 weeks    PT Treatment/Interventions  ADLs/Self Care Home Management;Gait training;Stair training;Functional mobility training;Therapeutic  activities;Patient/family education;Neuromuscular re-education;Balance training;Therapeutic exercise    PT Next Visit Plan  standing PWR moves on  compliant surfaces, stepping over obstacles, practice turns, multi-directional stepping activities, exercises for posture, provide local PD resources    Consulted and Agree with Plan of Care  Patient       Patient will benefit from skilled therapeutic intervention in order to improve the following deficits and impairments:  Abnormal gait, Decreased balance, Difficulty walking, Decreased strength, Postural dysfunction  Visit Diagnosis: Unsteadiness on feet  Other abnormalities of gait and mobility  Abnormal posture  Other lack of coordination     Problem List Patient Active Problem List   Diagnosis Date Noted  . Localized swelling of right lower extremity 04/12/2019  . DOE (dyspnea on exertion) 07/15/2018  . 10 year risk of MI or stroke 7.5% or greater 04/12/2018  . 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 Dysphagia, neurologic 07/27/2014  . Parkinson disease ([place]) 07/27/2014  . Dystonia 07/10/2014  . Low back pain 11/21/2011  . TRANSAMINASES, SERUM, ELEVATED 07/06/2008  . DIARRHEA 05/23/2008  . LYMPH NODE-ENLARGED 07/28/2007  . Non-Hodgkin lymphoma ([place]) 03/23/2007    Arliss Journey, PT, DPT 01/24/2020, 2:13 PM  [facility] [address] [place], [place], [address] Phone: [phone]   Fax:  [phone]  Name: NILSON GOATLEY MRN: [id number] Date of Birth: June 07, 1951

## 2020-01-26 ENCOUNTER — Ambulatory Visit: Payer: Medicare Other | Admitting: Physical Therapy

## 2020-01-27 ENCOUNTER — Encounter: Payer: Self-pay | Admitting: Physical Therapy

## 2020-01-27 ENCOUNTER — Ambulatory Visit: Payer: Medicare Other | Admitting: Physical Therapy

## 2020-01-27 ENCOUNTER — Other Ambulatory Visit: Payer: Self-pay

## 2020-01-27 DIAGNOSIS — R278 Other lack of coordination: Secondary | ICD-10-CM

## 2020-01-27 DIAGNOSIS — R2689 Other abnormalities of gait and mobility: Secondary | ICD-10-CM | POA: Diagnosis not present

## 2020-01-27 DIAGNOSIS — R293 Abnormal posture: Secondary | ICD-10-CM | POA: Diagnosis not present

## 2020-01-27 DIAGNOSIS — R2681 Unsteadiness on feet: Secondary | ICD-10-CM | POA: Diagnosis not present

## 2020-01-27 NOTE — Therapy (Signed)
[name] [address] [place], [place], [address] Phone: [phone]   Fax:  [phone]  Physical Therapy Treatment  Patient Details  Name: [name] [name] MRN: [id number] Date of Birth: 1951/02/07 Referring Provider (PT): Wells Guiles, Tat DO   Encounter Date: 01/27/2020  PT End of Session - 01/27/20 1023    Visit Number  6    Number of Visits  9    Date for PT Re-Evaluation  03/05/20    Authorization Type  Medicare    PT Start Time  0847    PT Stop Time  0930    PT Time Calculation (min)  43 min    Activity Tolerance  Patient tolerated treatment well    Behavior During Therapy  [facility] for tasks assessed/performed       Past Medical History:  Diagnosis Date  . Cervical lymphadenopathy    right - followed my ENT  . Dystonia 1994   diagnosed in [place]  . Non Hodgkin's lymphoma ([place]) 2007   diffuse- 6 cycles of chemo with R CHOP Rituxan - last dose 10/08  . Parkinson disease ([place])    2015    Past Surgical History:  Procedure Laterality Date  . CHOLECYSTECTOMY, LAPAROSCOPIC    . EYE SURGERY     x2 as child  . PORT-A-CATH REMOVAL    . PORTACATH PLACEMENT    . SPLENECTOMY    . TONSILLECTOMY      There were no vitals filed for this visit.  Subjective Assessment - 01/27/20 0852    Subjective  Pt reports being on antibiotic for tooth infection and having a root canal Thursday.  Denies any falls or other changes.    Pertinent History  non-hodgkins lymphoma, PD (diagnosed in 2015), cervical lymphadenopathy    Patient Stated Goals  wants to get some more motivation, wants to "get feet unstuck",working on balance.    Currently in Pain?  Other (Comment)   pain from tooth but not addressing in PT                      [facility] Adult PT Treatment/Exercise - 01/27/20 0001      Transfers   Transfers  Sit to Stand;Stand to Sit    Sit to Stand  6: Modified independent (Device/Increase time)    Stand to  Sit  6: Modified independent (Device/Increase time)      Ambulation/Gait   Ambulation/Gait  Yes    Ambulation/Gait Assistance  5: Supervision    Ambulation Distance (Feet)  --   in/out of clinic and between activities   Assistive device  None    Ambulation Surface  Level;Indoor      High Level Balance   High Level Balance Activities  Tandem walking;Other (comment)    High Level Balance Comments  Tandem stance at counter 10 sec x 3 reps each direction.  Tandem gait at counter on non compliant then compliant surface.  SLS x 10 sec x 3 reps at counter for support.  Provided tandem stance and SLS to HEP.  Taps to 6" step alternating LE's then taps to 6", 12", 6", floor.  Pt requiring UE support at times and cues to slow down.  Has difficulty with SLS to tap 12" step.  Performed >15 reps each.  Stepping onto/off 6" step forwards 10 reps each side, stepping down/back up same step x 10 reps.  Lateral stepping up/down 6" step x 10 reps.  UE support for  all these.  Performed 4 square stepping x 8 reps each direction without difficulty.         [facility]) - 01/27/20 1021    PWR! exercises  Moves in standing    PWR! Up  x 10    PWR! Rock  x 10    PWR! Twist  x 10    PWR Step  x 10    Basic 4 Flow  x 6 reps    Comments  Minimal cues for technique.  Able to perform flow in proper sequence.          PT Education - 01/27/20 1022    Education Details  Additions to [employer]) Educated  Patient    Methods  Explanation;Demonstration;Handout    Comprehension  Verbalized understanding;Returned demonstration       PT Short Term Goals - 01/05/20 1225      PT SHORT TERM GOAL #1   Title  ALL STGS = LTGS        PT Long Term Goals - 01/20/20 1140      PT LONG TERM GOAL #1   Title  Pt will verbalize plans for continued community fitness upon d/c from PT and be independent with Parkinson's specific HEP. ALL LTGS DUE 02/02/20    Time  4    Period  Weeks    Status  New      PT LONG TERM  GOAL #2   Title  Pt will improve miniBEST score to at least a 25/28 in order to demo decr fall risk.    Baseline  23/28    Time  4    Period  Weeks    Status  New      PT LONG TERM GOAL #3   Title  Pt will perform at least 5 sit <> stands with proper technique and improved eccentric control in order to demo improved functional BLE strength for transfers.    Time  4    Period  Weeks    Status  New      PT LONG TERM GOAL #4   Title  Pt will improve gait speed to at least 3.0 ft/sec for improved gait efficiency in the community.    Baseline  2.65 ft/sec    Time  4    Period  Weeks    Status  New      PT LONG TERM GOAL #5   Title  Pt will verbalize understanding of local Parkinson's disease resources.    Time  4    Period  Weeks    Status  New            Plan - 01/27/20 1023    Clinical Impression Statement  Pt challenged with SLS activities today so provided additions to HEP.  Performing Standing PWR! moves with only occassional cues for posture and control.  Cont PT per POC.    Personal Factors and Comorbidities  Comorbidity 3+    Comorbidities  non-hodgkins lymphoma, PD (diagnosed in 2015), cervical lymphadenopathy    Examination-Activity Limitations  Stairs;Locomotion Level    Examination-Participation Restrictions  Community Activity   exercise classes (due to covid)   Stability/Clinical Decision Making  Stable/Uncomplicated    Rehab Potential  Excellent    PT Frequency  2x / week    PT Duration  4 weeks    PT Treatment/Interventions  ADLs/Self Care Home Management;Gait training;Stair training;Functional mobility training;Therapeutic activities;Patient/family education;Neuromuscular re-education;Balance training;Therapeutic exercise  PT Next Visit Plan  standing PWR moves on compliant surfaces, stepping over obstacles, practice turns, multi-directional stepping activities, exercises for posture, provide local PD resources    Consulted and Agree with Plan of Care   Patient       Patient will benefit from skilled therapeutic intervention in order to improve the following deficits and impairments:  Abnormal gait, Decreased balance, Difficulty walking, Decreased strength, Postural dysfunction  Visit Diagnosis: Unsteadiness on feet  Other abnormalities of gait and mobility  Abnormal posture  Other lack of coordination     Problem List Patient Active Problem List   Diagnosis Date Noted  . Localized swelling of right lower extremity 04/12/2019  . DOE (dyspnea on exertion) 07/15/2018  . 10 year risk of MI or stroke 7.5% or greater 04/12/2018  . 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 Dysphagia, neurologic 07/27/2014  . Parkinson disease ([place]) 07/27/2014  . Dystonia 07/10/2014  . Low back pain 11/21/2011  . TRANSAMINASES, SERUM, ELEVATED 07/06/2008  . DIARRHEA 05/23/2008  . LYMPH NODE-ENLARGED 07/28/2007  . Non-Hodgkin lymphoma ([place]) 03/23/2007    Narda Bonds, PTA [place] 01/27/20 10:25 AM Phone: [phone] Fax: [facility] [address] [place], [place], [address] Phone: [phone]   Fax:  [phone]  Name: ALTO MESMER MRN: [id number] Date of Birth: 1950/11/11

## 2020-01-27 NOTE — Patient Instructions (Signed)
Stand facing counter and can hold onto counter top for support.  Try to use as little support as possible to challenge your balance safely.   Tandem Stance    Right foot in front of left, heel touching toe both feet "straight ahead". Stand on Foot Triangle of Support with both feet. Balance in this position 10 seconds. Do with left foot in front of right.  Copyright  VHI. All rights reserved.  SINGLE LIMB STANCE    Stance: single leg on floor. Raise leg. Hold 10 seconds. Repeat with other leg. 3-5 reps per set, 1-2 sets per day, 5-7 days per week  Copyright  VHI. All rights reserved.

## 2020-01-31 ENCOUNTER — Ambulatory Visit: Payer: Medicare Other | Admitting: Physical Therapy

## 2020-01-31 ENCOUNTER — Other Ambulatory Visit: Payer: Self-pay

## 2020-01-31 DIAGNOSIS — R2681 Unsteadiness on feet: Secondary | ICD-10-CM

## 2020-01-31 DIAGNOSIS — R293 Abnormal posture: Secondary | ICD-10-CM | POA: Diagnosis not present

## 2020-01-31 DIAGNOSIS — R2689 Other abnormalities of gait and mobility: Secondary | ICD-10-CM | POA: Diagnosis not present

## 2020-01-31 DIAGNOSIS — R278 Other lack of coordination: Secondary | ICD-10-CM | POA: Diagnosis not present

## 2020-01-31 NOTE — Patient Instructions (Signed)
Optimal Fitness Program after Therapy for People with Parkinson's Disease  1)  Therapy Home Exercise Program  -Do these Exercises DAILY as instructed by your therapist  -Big, deliberate effort with exercises  -These exercises are important to perform consistently, even when therapist has  finished, because these therapy exercises often address your specific  Parkinson's difficulties   2)  Walking  -  Work up to walking 3-5 times per week, 20-30 minutes per day  -This can be done at home, driveway, quiet street or an indoor track  -Focus should be on your Best posture, arm swing, step length for your best walking pattern  3)  Aerobic Exercise  -Work up to 3-5 times per week, 10-15 minutes, working up to 30 minutes per day  -This can be stationary bike, seated stepper machine, elliptical machine  -Work up to 6-8/10 intensity during the exercise, at minimal to moderate     Resistance  -HR:  91-121 bpm

## 2020-01-31 NOTE — Therapy (Signed)
[name] [address] [place], [place], [address] Phone: [phone]   Fax:  [phone]  Physical Therapy Treatment  Patient Details  Name: [name] [name] MRN: [id number] Date of Birth: [birth year] Referring Provider (PT): Wells Guiles, Tat DO   Encounter Date: 01/31/2020  PT End of Session - 01/31/20 1254    Visit Number  7    Number of Visits  9    Date for PT Re-Evaluation  03/05/20    Authorization Type  Medicare    PT Start Time  0850    PT Stop Time  0929    PT Time Calculation (min)  39 min    Activity Tolerance  Patient tolerated treatment well    Behavior During Therapy  [name] for tasks assessed/performed       Past Medical History:  Diagnosis Date  . Cervical lymphadenopathy    right - followed my ENT  . Dystonia 1994   diagnosed in [place]  . Non Hodgkin's lymphoma ([place]) 2007   diffuse- 6 cycles of chemo with R CHOP Rituxan - last dose 10/08  . Parkinson disease ([place])    2015    Past Surgical History:  Procedure Laterality Date  . CHOLECYSTECTOMY, LAPAROSCOPIC    . EYE SURGERY     x2 as child  . PORT-A-CATH REMOVAL    . PORTACATH PLACEMENT    . SPLENECTOMY    . TONSILLECTOMY      There were no vitals filed for this visit.                    [place] [facility] Adult PT Treatment/Exercise - 01/31/20 0001      Self-Care   Self-Care  Other Self-Care Comments    Other Self-Care Comments   Discussed facets of optimal PD exercise program:  walking (pt up to 23 minutes of walking); therapy HEP (pt performing daily), aerobic exercise (has recumbent bike at home).  PT utilized Seated SciFit Stepper as aerobic activity today, providing explanation on how to incorporate aerobic activity into his routine.  Discussed rating effort level and/or using HR as means to track intensity and to use intervals for improved intensity of exercise.  Provided patient with handout for online PD resources,  highlighting exercise options.        Exercises   Exercises  Other Exercises    Other Exercises   Seated SciFit, Level 2-2.5x 8 minutes, with 2-30 second intervals of increased intensity, >100 RPM.  Pt rates intensity as 6-7/10.   HR: 72 pre-ex; HR 88 post ex         Balance Exercises - 01/31/20 0921      Balance Exercises: Standing   Tandem Stance  Eyes open;2 reps;10 secs    SLS  Eyes open;Solid surface;Intermittent upper extremity support;3 reps;10 secs    Other Standing Exercises  Practiced quarter turns at counter, 3 reps to R, 3 reps to L, utilizing large side step and weightshift, then progressed to side/diagonal step and weigthshift to initiate turninga nd short distance gait, 3 reps each direction.    Other Standing Exercises Comments  Review of HEP provided last visit; cues provided for use of visual target, cues for tightening through abdominals and gluts for more upright posture.       Stepping over cones (x 2) and hurdles (x 3), forward direction, 2 sets. Pt experiences 1 LOB, able to independently recover.   PT Education - 01/31/20 1253    Education Details  PD optimal exercise program, online PD resources    Person(s) Educated  Patient    Methods  Explanation;Handout    Comprehension  Verbalized understanding       PT Short Term Goals - 01/05/20 1225      PT SHORT TERM GOAL #1   Title  ALL STGS = LTGS        PT Long Term Goals - 01/20/20 1140      PT LONG TERM GOAL #1   Title  Pt will verbalize plans for continued community fitness upon d/c from PT and be independent with Parkinson's specific HEP. ALL LTGS DUE 02/02/20    Time  4    Period  Weeks    Status  New      PT LONG TERM GOAL #2   Title  Pt will improve miniBEST score to at least a 25/28 in order to demo decr fall risk.    Baseline  23/28    Time  4    Period  Weeks    Status  New      PT LONG TERM GOAL #3   Title  Pt will perform at least 5 sit <> stands with proper technique and improved  eccentric control in order to demo improved functional BLE strength for transfers.    Time  4    Period  Weeks    Status  New      PT LONG TERM GOAL #4   Title  Pt will improve gait speed to at least 3.0 ft/sec for improved gait efficiency in the community.    Baseline  2.65 ft/sec    Time  4    Period  Weeks    Status  New      PT LONG TERM GOAL #5   Title  Pt will verbalize understanding of local Parkinson's disease resources.    Time  4    Period  Weeks    Status  New            Plan - 01/31/20 1254    Clinical Impression Statement  Reviewed additions to HEP, cues given to use UE support lightly as needed for full 10 second time for improved steadiness with SLS.  Discussed and focused on use of aerobic activity as part of his optimal PD exercise program upon d/c from PT (which will be next visit-pt had to cancel and reschedule for next week, due to root canal later this week).    Personal Factors and Comorbidities  Comorbidity 3+    Comorbidities  non-hodgkins lymphoma, PD (diagnosed in 2015), cervical lymphadenopathy    Examination-Activity Limitations  Stairs;Locomotion Level    Examination-Participation Restrictions  Community Activity   exercise classes (due to covid)   Stability/Clinical Decision Making  Stable/Uncomplicated    Rehab Potential  Excellent    PT Frequency  2x / week    PT Duration  4 weeks    PT Treatment/Interventions  ADLs/Self Care Home Management;Gait training;Stair training;Functional mobility training;Therapeutic activities;Patient/family education;Neuromuscular re-education;Balance training;Therapeutic exercise    PT Next Visit Plan  Check goals, follow up on aerobic activity addition to exercise plan, did pt work on any of the exercise programs online?  plan for d/c next visit    Consulted and Agree with Plan of Care  Patient       Patient will benefit from skilled therapeutic intervention in order to improve the following deficits and  impairments:  Abnormal gait, Decreased balance, Difficulty walking, Decreased strength, Postural  dysfunction  Visit Diagnosis: Unsteadiness on feet  Abnormal posture  Other abnormalities of gait and mobility     Problem List Patient Active Problem List   Diagnosis Date Noted  . Localized swelling of right lower extremity 04/12/2019  . DOE (dyspnea on exertion) 07/15/2018  . 10 year risk of MI or stroke 7.5% or greater 04/12/2018  . 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 Dysphagia, neurologic 07/27/2014  . Parkinson disease ([place]) 07/27/2014  . Dystonia 07/10/2014  . Low back pain 11/21/2011  . TRANSAMINASES, SERUM, ELEVATED 07/06/2008  . DIARRHEA 05/23/2008  . LYMPH NODE-ENLARGED 07/28/2007  . Non-Hodgkin lymphoma ([place]) 03/23/2007    Calixto Pavel W. 01/31/2020, 12:58 PM  Frazier Butt., PT   [facility] [address] [place], [place], [address] Phone: [phone]   Fax:  [phone]  Name: [name] [name] MRN: [id number] Date of Birth: 05/28/1951

## 2020-02-02 ENCOUNTER — Ambulatory Visit: Payer: Medicare Other | Admitting: Physical Therapy

## 2020-02-07 ENCOUNTER — Ambulatory Visit: Payer: Medicare Other | Admitting: Physical Therapy

## 2020-02-07 ENCOUNTER — Other Ambulatory Visit: Payer: Self-pay

## 2020-02-07 DIAGNOSIS — R2681 Unsteadiness on feet: Secondary | ICD-10-CM

## 2020-02-07 DIAGNOSIS — R278 Other lack of coordination: Secondary | ICD-10-CM | POA: Diagnosis not present

## 2020-02-07 DIAGNOSIS — R2689 Other abnormalities of gait and mobility: Secondary | ICD-10-CM

## 2020-02-07 DIAGNOSIS — R293 Abnormal posture: Secondary | ICD-10-CM

## 2020-02-07 NOTE — Therapy (Signed)
[facility] [address] [place], [place], [address] Phone: [phone]   Fax:  [phone]  Physical Therapy Treatment/Discharge Summary   Patient Details  Name: [name] [name] MRN: [id number] Date of Birth: [birth year] Referring Provider (PT): Wells Guiles, Tat DO   Encounter Date: 02/07/2020  PT End of Session - 02/07/20 1412    Visit Number  8    Number of Visits  9    Date for PT Re-Evaluation  03/05/20    Authorization Type  Medicare    PT Start Time  1102    PT Stop Time  1144    PT Time Calculation (min)  42 min    Activity Tolerance  Patient tolerated treatment well    Behavior During Therapy  [facility] for tasks assessed/performed       Past Medical History:  Diagnosis Date  . Cervical lymphadenopathy    right - followed my ENT  . Dystonia 1994   diagnosed in [place]  . Non Hodgkin's lymphoma ([place]) 2007   diffuse- 6 cycles of chemo with R CHOP Rituxan - last dose 10/08  . Parkinson disease ([place])    2015    Past Surgical History:  Procedure Laterality Date  . CHOLECYSTECTOMY, LAPAROSCOPIC    . EYE SURGERY     x2 as child  . PORT-A-CATH REMOVAL    . PORTACATH PLACEMENT    . SPLENECTOMY    . TONSILLECTOMY      There were no vitals filed for this visit.  Subjective Assessment - 02/07/20 1104    Subjective  Today's the first day that I've felt normal, after Thursday's root canal.    Pertinent History  non-hodgkins lymphoma, PD (diagnosed in 2015), cervical lymphadenopathy    Patient Stated Goals  wants to get some more motivation, wants to "get feet unstuck",working on balance.    Currently in Pain?  Other (Comment)   ache from root canal-PT not addressing                       [facility] Adult PT Treatment/Exercise - 02/07/20 0001      Transfers   Transfers  Sit to Stand;Stand to Sit    Sit to Stand  6: Modified independent (Device/Increase time)    Five time sit to stand comments    9.85 seconds from standard height chair with no UE support - poor eccentric control     Stand to Sit  6: Modified independent (Device/Increase time)    Number of Reps  2 sets;Other reps (comment)   5 reps     Ambulation/Gait   Ambulation/Gait  Yes    Ambulation/Gait Assistance  6: Modified independent (Device/Increase time)    Assistive device  None    Gait Pattern  Step-through pattern;Decreased trunk rotation;Trunk flexed    Ambulation Surface  Level;Indoor    Gait velocity  7.31 sec = 4.48 ft/sec      Standardized Balance Assessment   Standardized Balance Assessment  Timed Up and Go Test      Timed Up and Go Test   TUG  Normal TUG;Cognitive TUG    Normal TUG (seconds)  8.88    Cognitive TUG (seconds)  8.62      Mini-BESTest   Sit To Stand  Normal: Comes to stand without use of hands and stabilizes independently.    Rise to Toes  Normal: Stable for 3 s with maximum height.    Stand on one leg (  left)  Moderate: < 20 s   4.18, 5.22   Stand on one leg (right)  Moderate: < 20 s   1.82, 2.12   Stand on one leg - lowest score  1    Compensatory Stepping Correction - Forward  Normal: Recovers independently with a single, large step (second realignement is allowed).    Compensatory Stepping Correction - Backward  Normal: Recovers independently with a single, large step    Compensatory Stepping Correction - Left Lateral  Normal: Recovers independently with 1 step (crossover or lateral OK)    Compensatory Stepping Correction - Right Lateral  Normal: Recovers independently with 1 step (crossover or lateral OK)    Stepping Corredtion Lateral - lowest score  2    Stance - Feet together, eyes open, firm surface   Normal: 30s    Stance - Feet together, eyes closed, foam surface   Normal: 30s    Incline - Eyes Closed  Normal: Stands independently 30s and aligns with gravity    Change in Gait Speed  Normal: Significantly changes walkling speed without imbalance    Walk with head turns -  Horizontal  Normal: performs head turns with no change in gait speed and good balance    Walk with pivot turns  Normal: Turns with feet close FAST (< 3 steps) with good balance.    Step over obstacles  Normal: Able to step over box with minimal change of gait speed and with good balance.    Timed UP & GO with Dual Task  Normal: No noticeable change in sitting, standing or walking while backward counting when compared to TUG without    Mini-BEST total score  27          Self Care: -Pt verbalized understanding of aerobic exercise additions to his exercise program, "using the the intervals on my bike and that's more interesting." -Pt's plan is to alternate walking or riding recumbent bike every day (each 3x/wk)  Discussed plans return evals for all 3 disciplines as best practice for people with PD to follow up; pt has not seen OT in at least 2 years and speech in >3 years.  He is not reporting any significant functional changes with either, but verbalizes understanding for return evals.   PT Education - 02/07/20 1411    Education Details  Progress towards goals, POC, and plans for d/c this visit.  Discussed return PT, OT, speech evals-in approx 9 months due to progressive nature of disease process    Person(s) Educated  Patient    Methods  Explanation    Comprehension  Verbalized understanding       PT Short Term Goals - 01/05/20 1225      PT SHORT TERM GOAL #1   Title  ALL STGS = LTGS        PT Long Term Goals - 02/07/20 1106      PT LONG TERM GOAL #1   Title  Pt will verbalize plans for continued community fitness upon d/c from PT and be independent with Parkinson's specific HEP. ALL LTGS DUE 02/02/20    Time  4    Period  Weeks    Status  Achieved      PT LONG TERM GOAL #2   Title  Pt will improve miniBEST score to at least a 25/28 in order to demo decr fall risk.    Baseline  02/07/2020 27/28    Time  4    Period  Weeks  Status  Achieved      PT LONG TERM GOAL #3    Title  Pt will perform at least 5 sit <> stands with proper technique and improved eccentric control in order to demo improved functional BLE strength for transfers.    Time  4    Period  Weeks    Status  Partially Met      PT LONG TERM GOAL #4   Title  Pt will improve gait speed to at least 3.0 ft/sec for improved gait efficiency in the community.    Baseline  2.65 ft/sec, >4 ft/sec 02/07/2020    Time  4    Period  Weeks    Status  Achieved      PT LONG TERM GOAL #5   Title  Pt will verbalize understanding of local Parkinson's disease resources.    Time  4    Period  Weeks    Status  Achieved            Plan - 02/07/20 1413    Clinical Impression Statement  Assessed LTGs this visit, with pt meeting 4 of 5 LTGs.  LTG 3 partially met for sit<>stand, with pt needing cues for improved eccentric control.  Pt has made nice progress towards functional mobility measures, and he verbalizes plans for continued community fitness.  Discussed return PT eval in approx 9 months, in addition to OT and speech therapy evaluations; pt in agreement.  He is appropriate for d/c from PT at this time.    Personal Factors and Comorbidities  Comorbidity 3+    Comorbidities  non-hodgkins lymphoma, PD (diagnosed in 2015), cervical lymphadenopathy    Examination-Activity Limitations  Stairs;Locomotion Level    Examination-Participation Restrictions  Community Activity   exercise classes (due to covid)   Stability/Clinical Decision Making  Stable/Uncomplicated    Rehab Potential  Excellent    PT Frequency  1x / week    PT Duration  Other (comment)   1 week-to cover visit on 02/07/2020.   PT Treatment/Interventions  ADLs/Self Care Home Management;Gait training;Stair training;Functional mobility training;Therapeutic activities;Patient/family education;Neuromuscular re-education;Balance training;Therapeutic exercise    PT Next Visit Plan  D/C this visit; plan for return PT, OT, speech evaluations in 9 months due  to progressive nature of disease    Consulted and Agree with Plan of Care  Patient       Patient will benefit from skilled therapeutic intervention in order to improve the following deficits and impairments:  Abnormal gait, Decreased balance, Difficulty walking, Decreased strength, Postural dysfunction  Visit Diagnosis: Other abnormalities of gait and mobility  Abnormal posture  Unsteadiness on feet     Problem List Patient Active Problem List   Diagnosis Date Noted  . Localized swelling of right lower extremity 04/12/2019  . DOE (dyspnea on exertion) 07/15/2018  . 10 year risk of MI or stroke 7.5% or greater 04/12/2018  . 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 Dysphagia, neurologic 07/27/2014  . Parkinson disease ([place]) 07/27/2014  . Dystonia 07/10/2014  . Low back pain 11/21/2011  . TRANSAMINASES, SERUM, ELEVATED 07/06/2008  . DIARRHEA 05/23/2008  . LYMPH NODE-ENLARGED 07/28/2007  . Non-Hodgkin lymphoma ([place]) 03/23/2007    Aissa Lisowski W. 02/07/2020, 2:17 PM  Frazier Butt., PT  [facility] [address] [place], [place], [address] Phone: [phone]   Fax:  [phone]  Name: [name] [name] MRN: [id number] Date of Birth: [birth year]   PHYSICAL THERAPY  DISCHARGE SUMMARY  Visits from Start of Care: 8  Current functional level related to goals / functional outcomes: PT Long Term Goals - 02/07/20 1106      PT LONG TERM GOAL #1   Title  Pt will verbalize plans for continued community fitness upon d/c from PT and be independent with Parkinson's specific HEP. ALL LTGS DUE 02/02/20    Time  4    Period  Weeks    Status  Achieved      PT LONG TERM GOAL #2   Title  Pt will improve miniBEST score to at least a 25/28 in order to demo decr fall risk.    Baseline  02/07/2020 27/28    Time  4    Period  Weeks    Status  Achieved      PT  LONG TERM GOAL #3   Title  Pt will perform at least 5 sit <> stands with proper technique and improved eccentric control in order to demo improved functional BLE strength for transfers.    Time  4    Period  Weeks    Status  Partially Met      PT LONG TERM GOAL #4   Title  Pt will improve gait speed to at least 3.0 ft/sec for improved gait efficiency in the community.    Baseline  2.65 ft/sec, >4 ft/sec 02/07/2020    Time  4    Period  Weeks    Status  Achieved      PT LONG TERM GOAL #5   Title  Pt will verbalize understanding of local Parkinson's disease resources.    Time  4    Period  Weeks    Status  Achieved      Pt has met 4 of 5 LTGs.   Remaining deficits: Posture, balance, gait-improving   Education / Equipment: Educated in [employer], Parkinson's disease resources  Plan: Patient agrees to discharge.  Patient goals were met. Patient is being discharged due to meeting the stated rehab goals.  ?????Recommend return PT evaluation in 6-9 months (and pt agrees) due to progressive nature of disease process.   Mady Haagensen, PT 02/07/20 2:35 PM Phone: [phone] Fax: [phone]

## 2020-03-07 ENCOUNTER — Encounter: Payer: Self-pay | Admitting: Family Medicine

## 2020-03-07 ENCOUNTER — Ambulatory Visit (INDEPENDENT_AMBULATORY_CARE_PROVIDER_SITE_OTHER): Payer: Medicare Other | Admitting: Family Medicine

## 2020-03-07 ENCOUNTER — Other Ambulatory Visit: Payer: Self-pay

## 2020-03-07 VITALS — BP 110/68 | HR 94 | Temp 96.5°F | Ht 66.0 in | Wt 203.4 lb

## 2020-03-07 DIAGNOSIS — R0600 Dyspnea, unspecified: Secondary | ICD-10-CM | POA: Diagnosis not present

## 2020-03-07 DIAGNOSIS — R0609 Other forms of dyspnea: Secondary | ICD-10-CM

## 2020-03-07 DIAGNOSIS — K219 Gastro-esophageal reflux disease without esophagitis: Secondary | ICD-10-CM | POA: Diagnosis not present

## 2020-03-07 DIAGNOSIS — H6121 Impacted cerumen, right ear: Secondary | ICD-10-CM

## 2020-03-07 HISTORY — DX: Gastro-esophageal reflux disease without esophagitis: K21.9

## 2020-03-07 MED ORDER — PANTOPRAZOLE SODIUM 40 MG PO TBEC: 40.0000 mg | DELAYED_RELEASE_TABLET | Freq: Every day | ORAL | 1 refills | Status: DC

## 2020-03-07 NOTE — Patient Instructions (Addendum)
OK to use Debrox (peroxide) in the ear to loosen up wax. Also recommend using a bulb syringe (for removing boogers from baby's noses) to flush through warm water. Do not use Q-tips as this can impact wax further.  The only lifestyle changes that have data behind them are weight loss for the overweight/obese and elevating the head of the bed. Finding out which foods/positions are triggers is important.  If you do not hear anything about your referral in the next 1-2 weeks, call our office and ask for an update.  Let us know if you need anything.  How to Perform the Epley Maneuver The Epley maneuver is an exercise that relieves symptoms of vertigo. Vertigo is the feeling that you or your surroundings are moving when they are not. When you feel vertigo, you may feel like the room is spinning and have trouble walking. Dizziness is a little different than vertigo. When you are dizzy, you may feel unsteady or light-headed. You can do this maneuver at home whenever you have symptoms of vertigo. You can do it up to 3 times a day until your symptoms go away. Even though the Epley maneuver may relieve your vertigo for a few weeks, it is possible that your symptoms will return. This maneuver relieves vertigo, but it does not relieve dizziness. What are the risks? If it is done correctly, the Epley maneuver is considered safe. Sometimes it can lead to dizziness or nausea that goes away after a short time. If you develop other symptoms, such as changes in vision, weakness, or numbness, stop doing the maneuver and call your health care provider. How to perform the Epley maneuver 1. Sit on the edge of a bed or table with your back straight and your legs extended or hanging over the edge of the bed or table. 2. Turn your head halfway toward the affected ear or side. 3. Lie backward quickly with your head turned until you are lying flat on your back. You may want to position a pillow under your shoulders. 4. Hold  this position for 30 seconds. You may experience an attack of vertigo. This is normal. 5. Turn your head to the opposite direction until your unaffected ear is facing the floor. 6. Hold this position for 30 seconds. You may experience an attack of vertigo. This is normal. Hold this position until the vertigo stops. 7. Turn your whole body to the same side as your head. Hold for another 30 seconds. 8. Sit back up. You can repeat this exercise up to 3 times a day. Follow these instructions at home:  After doing the Epley maneuver, you can return to your normal activities.  Ask your health care provider if there is anything you should do at home to prevent vertigo. He or she may recommend that you: ? Keep your head raised (elevated) with two or more pillows while you sleep. ? Do not sleep on the side of your affected ear. ? Get up slowly from bed. ? Avoid sudden movements during the day. ? Avoid extreme head movement, like looking up or bending over. Contact a health care provider if:  Your vertigo gets worse.  You have other symptoms, including: ? Nausea. ? Vomiting. ? Headache. Get help right away if:  You have vision changes.  You have a severe or worsening headache or neck pain.  You cannot stop vomiting.  You have new numbness or weakness in any part of your body. Summary  Vertigo is the feeling  that you or your surroundings are moving when they are not.  The Epley maneuver is an exercise that relieves symptoms of vertigo.  If the Epley maneuver is done correctly, it is considered safe. You can do it up to 3 times a day. This information is not intended to replace advice given to you by your health care provider. Make sure you discuss any questions you have with your health care provider. Document Revised: 08/21/2017 Document Reviewed: 07/29/2016 Elsevier Patient Education  2020 [employer].

## 2020-03-07 NOTE — Progress Notes (Signed)
Chief Complaint  Patient presents with   Ear Fullness    Subjective: Patient is a 69 y.o. male here for R ear fullness.  +hx of wax buildup on R. This time has been bothering him for several months. Has been clearing up on it's own, but stopped doing that recently. +hearing loss and fullness. No pain or drainage.   Reflux has been bothering over past year. Takes Pepcid OTC that helps. He will have a cough and burning. Feels it in his neck. No weight loss or N/V. BM's are normal.   Hx of DOE. Referred to pulm. He states he was never contacted about his testing (PFT's) and no f/u was scheduled. The pandemic his shortly after and he never followed up. Still having issue.   Past Medical History:  Diagnosis Date   Cervical lymphadenopathy    right - followed my ENT   Dystonia 1994   diagnosed in [place]   Non Hodgkin's lymphoma ([place]) 2007   diffuse- 6 cycles of chemo with R CHOP Rituxan - last dose 10/08   Parkinson disease ([place])    2015    Objective: BP 110/68 (BP Location: Left Arm, Patient Position: Sitting, Cuff Size: Normal)    Pulse 94    Temp (!) 96.5 F (35.8 C) (Temporal)    Ht 5\' 6"  (1.676 m)    Wt 203 lb 6 oz (92.3 kg)    SpO2 94%    BMI 32.83 kg/m  General: Awake, appears stated age Ears: B/l ears 90% obstructed with cerumen. Tm neg. Canals unremarkable otherwise. No ext ttp.  Heart: RRR Lungs: CTAB, no rales, wheezes or rhonchi. No accessory muscle use Abd: S, NT, ND, no masses or organomegaly. Psych: Age appropriate judgment and insight, normal affect and mood  Assessment and Plan: Impacted cerumen of right ear  Gastroesophageal reflux disease, unspecified whether esophagitis present - Plan: pantoprazole (PROTONIX) 40 MG tablet  DOE (dyspnea on exertion) - Plan: Ambulatory referral to Pulmonology  1- Irrigation today. Felt better. 2- Start PPI for 60 d. I will see him in 2 mo.  3- Refer back to pulm.  The patient voiced understanding and agreement to the  plan.  [place], DO 03/07/20  10:42 AM

## 2020-03-30 ENCOUNTER — Other Ambulatory Visit: Payer: Self-pay | Admitting: Family Medicine

## 2020-03-30 DIAGNOSIS — K219 Gastro-esophageal reflux disease without esophagitis: Secondary | ICD-10-CM

## 2020-04-26 ENCOUNTER — Encounter: Payer: Self-pay | Admitting: Pulmonary Disease

## 2020-04-26 ENCOUNTER — Ambulatory Visit (INDEPENDENT_AMBULATORY_CARE_PROVIDER_SITE_OTHER): Payer: Medicare Other | Admitting: Pulmonary Disease

## 2020-04-26 ENCOUNTER — Other Ambulatory Visit: Payer: Self-pay | Admitting: Family Medicine

## 2020-04-26 ENCOUNTER — Other Ambulatory Visit: Payer: Self-pay

## 2020-04-26 VITALS — BP 112/74 | HR 72 | Temp 98.2°F | Ht 66.0 in | Wt 206.4 lb

## 2020-04-26 DIAGNOSIS — R0602 Shortness of breath: Secondary | ICD-10-CM | POA: Diagnosis not present

## 2020-04-26 DIAGNOSIS — C8597 Non-Hodgkin lymphoma, unspecified, spleen: Secondary | ICD-10-CM

## 2020-04-26 DIAGNOSIS — R0600 Dyspnea, unspecified: Secondary | ICD-10-CM | POA: Diagnosis not present

## 2020-04-26 DIAGNOSIS — R0609 Other forms of dyspnea: Secondary | ICD-10-CM

## 2020-04-26 DIAGNOSIS — G20 Parkinson's disease: Secondary | ICD-10-CM

## 2020-04-26 DIAGNOSIS — Z9081 Acquired absence of spleen: Secondary | ICD-10-CM

## 2020-04-26 DIAGNOSIS — K219 Gastro-esophageal reflux disease without esophagitis: Secondary | ICD-10-CM

## 2020-04-26 NOTE — Patient Instructions (Addendum)
Thank you for visiting Dr. Valeta Harms at [facility]. Today we recommend the following:  Orders Placed This Encounter  Procedures  . Pulmonary Function Test   Continue to exercise as much as tolerated.   Return in about 4 weeks (around 05/24/2020) for with APP. to review PFTs     Please do your part to reduce the spread of COVID-19.

## 2020-04-26 NOTE — Progress Notes (Signed)
Synopsis: Referred in Aug 2021 for dyspnea on exertion by [name]*  Subjective:   PATIENT ID: [name] [name] GENDER: male DOB: 1951/05/25, MRN: [id number]  Chief Complaint  Patient presents with  . Consult    shortness of breath exertion    69 year old gentleman with a past medical history of non-Hodgkin's lymphoma in 2007 treated with R-CHOP, Parkinson's disease diagnosed in 2015.  Family history of heart disease.  Patient is a lifelong non-smoker.  Last echocardiogram November 2019 with normal ejection fraction 60 to 65% possibility of a small muscular VSD mild aortic regurgitation, moderate pulmonary regurgitation.  Patient last seen by PCP in June 2021.  Patient has not had any pulmonary function test.  Looks like they were ordered back in November 2020.  OV 04/26/2020: patient was initially seen in clinic by Dr. Lake Bells in 2019 and failed to follow up during height of covid.  Patient followed by Dr. Carles Collet neurology.  He states that he has dyspnea on exertion predominantly with climbing stairs.  He has noticed his back is starting to arch a little bit more he also feels like he is "getting stuck a little more often" this is related to his Parkinson's disease.  He does feel like he is moving a little bit slower than previous.  Patient denies chest pain, nausea vomiting.   Past Medical History:  Diagnosis Date  . Cervical lymphadenopathy    right - followed my ENT  . Dystonia 1994   diagnosed in [place]  . Non Hodgkin's lymphoma ([place]) 2007   diffuse- 6 cycles of chemo with R CHOP Rituxan - last dose 10/08  . Parkinson disease ([place])    2015     Family History  Problem Relation Age of Onset  . Heart disease Mother   . Cancer Mother        lung  . Heart disease Father      Past Surgical History:  Procedure Laterality Date  . CHOLECYSTECTOMY, LAPAROSCOPIC    . EYE SURGERY     x2 as child  . PORT-A-CATH REMOVAL    . PORTACATH PLACEMENT    . SPLENECTOMY    .  TONSILLECTOMY      Social History   Socioeconomic History  . Marital status: Married    Spouse name: Not on file  . Number of children: 5  . Years of education: Not on file  . Highest education level: Bachelor's degree (e.g., BA, AB, BS)  Occupational History  . Not on file  Tobacco Use  . Smoking status: Never Smoker  . Smokeless tobacco: Never Used  Vaping Use  . Vaping Use: Never used  Substance and Sexual Activity  . Alcohol use: No    Alcohol/week: 0.0 standard drinks  . Drug use: No  . Sexual activity: Not on file  Other Topics Concern  . Not on file  Social History Narrative   Right handed   3 story home   Lives with spouse   Social Determinants of Health   Financial Resource Strain:   . Difficulty of Paying Living Expenses:   Food Insecurity:   . Worried About Charity fundraiser in the Last Year:   . Arboriculturist in the Last Year:   Transportation Needs:   . Film/video editor (Medical):   Marland Kitchen Lack of Transportation (Non-Medical):   Physical Activity:   . Days of Exercise per Week:   . Minutes of Exercise per Session:   Stress:   .  Feeling of Stress :   Social Connections:   . Frequency of Communication with Friends and Family:   . Frequency of Social Gatherings with Friends and Family:   . Attends Religious Services:   . Active Member of Clubs or Organizations:   . Attends Archivist Meetings:   Marland Kitchen Marital Status:   Intimate Partner Violence:   . Fear of Current or Ex-Partner:   . Emotionally Abused:   Marland Kitchen Physically Abused:   . Sexually Abused:      No Known Allergies   Outpatient Medications Prior to Visit  Medication Sig Dispense Refill  . aspirin 81 MG chewable tablet Chew 81 mg by mouth daily.    . carbidopa-levodopa (SINEMET IR) 25-100 MG tablet TAKE 1 TABLET BY MOUTH THREE TIMES A DAY 270 tablet 1  . Cholecalciferol (VITAMIN D3) 3000 UNITS TABS Take 1,000 Units by mouth.     . clonazePAM (KLONOPIN) 0.5 MG tablet Take 0.5  tablets (0.25 mg total) by mouth at bedtime. 45 tablet 1  . cyanocobalamin 100 MCG tablet Take 1,000 mcg by mouth daily.     . Melatonin 3 MG TABS Take by mouth at bedtime.    . pantoprazole (PROTONIX) 40 MG tablet TAKE 1 TABLET BY MOUTH EVERY DAY 30 tablet 1  . pramipexole (MIRAPEX) 0.75 MG tablet Take 1 tablet (0.75 mg total) by mouth 3 (three) times daily. 270 tablet 1   No facility-administered medications prior to visit.    Review of Systems  Constitutional: Negative for chills, fever, malaise/fatigue and weight loss.  HENT: Negative for hearing loss, sore throat and tinnitus.   Eyes: Negative for blurred vision and double vision.  Respiratory: Positive for shortness of breath. Negative for cough, hemoptysis, sputum production, wheezing and stridor.   Cardiovascular: Negative for chest pain, palpitations, orthopnea, leg swelling and PND.  Gastrointestinal: Negative for abdominal pain, constipation, diarrhea, heartburn, nausea and vomiting.  Genitourinary: Negative for dysuria, hematuria and urgency.  Musculoskeletal: Negative for joint pain and myalgias.  Skin: Negative for itching and rash.  Neurological: Negative for dizziness, tingling, weakness and headaches.  Endo/Heme/Allergies: Negative for environmental allergies. Does not bruise/bleed easily.  Psychiatric/Behavioral: Negative for depression. The patient is not nervous/anxious and does not have insomnia.   All other systems reviewed and are negative.    Objective:  Physical Exam Vitals reviewed.  Constitutional:      General: He is not in acute distress.    Appearance: He is well-developed.  HENT:     Head: Normocephalic and atraumatic.  Eyes:     General: No scleral icterus.    Conjunctiva/sclera: Conjunctivae normal.     Pupils: Pupils are equal, round, and reactive to light.  Neck:     Vascular: No JVD.     Trachea: No tracheal deviation.  Cardiovascular:     Rate and Rhythm: Normal rate and regular rhythm.       Heart sounds: Normal heart sounds. No murmur heard.   Pulmonary:     Effort: Pulmonary effort is normal. No tachypnea, accessory muscle usage or respiratory distress.     Breath sounds: No stridor. No wheezing, rhonchi or rales.  Abdominal:     General: Bowel sounds are normal. There is no distension.     Palpations: Abdomen is soft.     Tenderness: There is no abdominal tenderness.  Musculoskeletal:        General: Deformity present. No tenderness.     Cervical back: Neck supple.  Comments: Kyphosis of the thoracic spine  Lymphadenopathy:     Cervical: No cervical adenopathy.  Skin:    General: Skin is warm and dry.     Capillary Refill: Capillary refill takes less than 2 seconds.     Findings: No rash.  Neurological:     Mental Status: He is alert and oriented to person, place, and time.     Gait: Gait abnormal.     Comments: Faint tremor of the left upper extremity  Psychiatric:        Behavior: Behavior normal.      Vitals:   04/26/20 0856  BP: 112/74  Pulse: 72  Temp: 98.2 F (36.8 C)  TempSrc: Oral  SpO2: 96%  Weight: 206 lb 6.4 oz (93.6 kg)  Height: 5\' 6"  (1.676 m)   96% on RA BMI Readings from Last 3 Encounters:  04/26/20 33.31 kg/m  03/07/20 32.83 kg/m  11/24/19 32.60 kg/m   Wt Readings from Last 3 Encounters:  04/26/20 206 lb 6.4 oz (93.6 kg)  03/07/20 203 lb 6 oz (92.3 kg)  11/24/19 202 lb (91.6 kg)     CBC    Component Value Date/Time   WBC 9.1 04/12/2018 0807   RBC 4.64 04/12/2018 0807   HGB 15.5 04/12/2018 0807   HGB 15.8 05/20/2012 0839   HGB 15.4 02/10/2008 0844   HCT 45.4 04/12/2018 0807   HCT 45.1 05/20/2012 0839   HCT 45.0 02/10/2008 0844   PLT 307.0 04/12/2018 0807   PLT 289 05/20/2012 0839   PLT 261 02/10/2008 0844   MCV 98.0 04/12/2018 0807   MCV 95 05/20/2012 0839   MCV 97.2 02/10/2008 0844   MCH 33.1 09/26/2014 0925   MCHC 34.2 04/12/2018 0807   RDW 14.1 04/12/2018 0807   RDW 14.9 05/20/2012 0839   RDW 14.8  (H) 02/10/2008 0844   LYMPHSABS 5.1 (H) 03/27/2015 0824   LYMPHSABS 4.0 (H) 05/20/2012 0839   LYMPHSABS 1.8 02/10/2008 0844   MONOABS 1.0 03/27/2015 0824   MONOABS 0.6 02/10/2008 0844   EOSABS 0.3 03/27/2015 0824   EOSABS 0.4 05/20/2012 0839   BASOSABS 0.1 03/27/2015 0824   BASOSABS 0.0 05/20/2012 0839   BASOSABS 0.0 02/10/2008 0844    Chest Imaging: 05/03/2018 chest x-ray: Was completed for evaluation of shortness of breath there was no acute abnormality seen. The patient's images have been independently reviewed by me.    Pulmonary Functions Testing Results: No flowsheet data found.  FeNO:   Pathology:   Echocardiogram:  07/23/2018 echocardiogram: Normal left ventricular ejection fraction, normal wall motion, possible small VSD, mild AR, moderate PR.  Results reviewed in epic 04/26/2020  Heart Catheterization:     Assessment & Plan:     ICD-10-CM   1. DOE (dyspnea on exertion)  R06.00 Pulmonary Function Test  2. Shortness of breath  R06.02   3. Parkinson disease ([place])  G20   4. Non-Hodgkin's lymphoma of spleen, unspecified non-Hodgkin lymphoma type ([place])  C85.97   5. 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 He presents with Dyspnea on exertion.  This is multifactorial in him I believe.  He also has a small muscular VSD on previous echocardiogram p I am not sure how this plays into his current medical conditions.  We discussed various etiologies I think a big component of this is related to his neuromuscular disease and he has slowly been gaining  weight.  He additionally has likely a component of restrictive lung disease from his progressive thoracic kyphosis.  Plan: We will obtain pulmonary function test to take a look at his spirometry.  I suspect he will have some mild restriction due to thoracic kyphosis as stated above. Could consider a repeat  echocardiogram at some point to take a look at his previously documented small muscular VSD.  However he does not have any signs of heart failure clinically and does not have an audible murmur. We appreciate the consultation.  Recommend increased exercise and weight loss if able.    Current Outpatient Medications:  .  aspirin 81 MG chewable tablet, Chew 81 mg by mouth daily., Disp: , Rfl:  .  carbidopa-levodopa (SINEMET IR) 25-100 MG tablet, TAKE 1 TABLET BY MOUTH THREE TIMES A DAY, Disp: 270 tablet, Rfl: 1 .  Cholecalciferol (VITAMIN D3) 3000 UNITS TABS, Take 1,000 Units by mouth. , Disp: , Rfl:  .  clonazePAM (KLONOPIN) 0.5 MG tablet, Take 0.5 tablets (0.25 mg total) by mouth at bedtime., Disp: 45 tablet, Rfl: 1 .  cyanocobalamin 100 MCG tablet, Take 1,000 mcg by mouth daily. , Disp: , Rfl:  .  Melatonin 3 MG TABS, Take by mouth at bedtime., Disp: , Rfl:  .  pantoprazole (PROTONIX) 40 MG tablet, TAKE 1 TABLET BY MOUTH EVERY DAY, Disp: 30 tablet, Rfl: 1 .  pramipexole (MIRAPEX) 0.75 MG tablet, Take 1 tablet (0.75 mg total) by mouth 3 (three) times daily., Disp: 270 tablet, Rfl: 1   Garner Nash, DO [facility] 04/26/2020 9:19 AM

## 2020-05-01 ENCOUNTER — Other Ambulatory Visit: Payer: Self-pay | Admitting: Neurology

## 2020-05-04 ENCOUNTER — Other Ambulatory Visit: Payer: Self-pay | Admitting: Neurology

## 2020-05-04 NOTE — Telephone Encounter (Signed)
Rx(s) sent to pharmacy electronically.  

## 2020-05-09 ENCOUNTER — Ambulatory Visit (INDEPENDENT_AMBULATORY_CARE_PROVIDER_SITE_OTHER): Payer: Medicare Other | Admitting: Family Medicine

## 2020-05-09 ENCOUNTER — Other Ambulatory Visit: Payer: Self-pay

## 2020-05-09 ENCOUNTER — Encounter: Payer: Self-pay | Admitting: Family Medicine

## 2020-05-09 VITALS — BP 110/68 | HR 79 | Temp 98.3°F | Ht 66.0 in | Wt 206.4 lb

## 2020-05-09 DIAGNOSIS — K219 Gastro-esophageal reflux disease without esophagitis: Secondary | ICD-10-CM | POA: Diagnosis not present

## 2020-05-09 NOTE — Patient Instructions (Addendum)
Let's stop the Protonix for now.   If your reflux symptoms return, take Pepcid AC 1-2 times daily for the next 1-2 weeks.  If symptoms are still bothersome, start Protonix again and let me know.  Let us know if you need anything.

## 2020-05-09 NOTE — Progress Notes (Signed)
Chief Complaint  Patient presents with  . Follow-up    Subjective: Patient is a 69 y.o. male here for GERD f/u.  Started on Protonix 40 mg/d for 2 mo trial. No AE's, compliant, helped symptoms completely.    Past Medical History:  Diagnosis Date  . Cervical lymphadenopathy    right - followed my ENT  . Dystonia 1994   diagnosed in [place]  . Non Hodgkin's lymphoma ([place]) 2007   diffuse- 6 cycles of chemo with R CHOP Rituxan - last dose 10/08  . Parkinson disease ([place])    2015    Objective: BP 110/68 (BP Location: Left Arm, Patient Position: Sitting, Cuff Size: Normal)   Pulse 79   Temp 98.3 F (36.8 C) (Oral)   Ht 5\' 6"  (1.676 m)   Wt 206 lb 6 oz (93.6 kg)   SpO2 96%   BMI 33.31 kg/m  General: Awake, appears stated age Abd: BS+, S, NT, ND Heart: RRR, no murmurs Lungs: CTAB, no rales, wheezes or rhonchi. No accessory muscle use Psych: Age appropriate judgment and insight, normal affect and mood  Assessment and Plan: Gastroesophageal reflux disease, unspecified whether esophagitis present  Stop PPI. Monitor s/s's. If they return, use Pepcid for 1-2 weeks. Then Protonix if that does not work.  F/u for AWV at convenience. The patient voiced understanding and agreement to the plan.  [place], DO 05/09/20  8:10 AM

## 2020-05-15 NOTE — Progress Notes (Signed)
Assessment/Plan:   1.  Parkinsons Disease  -continue pramipexole, 0.75 mg 3 times per day.  -Continue carbidopa/levodopa 25/100, 1 tablet 3 times per day.  2. Meige syndrome  -stable. No significant increase dysphagia  3. Insomnia and mild RBD  -increase clonazepam, 0.5 mg, 1 tablet at bed. PDMP reviewed. No red flags.  4  Memory change  -suspect MCI  -declines neurocognitive testing Subjective:   [name] [name] was seen today in follow up for Parkinsons disease.  My previous records were reviewed prior to todays visit as well as outside records available to me. Pt denies falls.  Pt denies lightheadedness, near syncope.  No hallucinations.  Mood has been good. Clonazepam started last visit for insomnia and mild RBD. States that he is only getting an hour or two of sleep.  No acting out of the dreams - "I hardly move."  Is doing [employer] Moves class but states they just cancelled it again.  Current prescribed movement disorder medications: Pramipexole, 0.75 mg 3 times daily Carbidopa/levodopa 25/100, 1 tablet 3 times per day clonazepam, 0.5 mg, half tablet at bedtime (started last visit   ALLERGIES:  No Known Allergies  CURRENT MEDICATIONS:  Outpatient Encounter Medications as of 05/17/2020  Medication Sig  . aspirin 81 MG chewable tablet Chew 81 mg by mouth daily.  . carbidopa-levodopa (SINEMET IR) 25-100 MG tablet TAKE 1 TABLET BY MOUTH THREE TIMES A DAY  . Cholecalciferol (VITAMIN D3) 3000 UNITS TABS Take 1,000 Units by mouth daily.   . clonazePAM (KLONOPIN) 0.5 MG tablet Take 0.5 tablets (0.25 mg total) by mouth at bedtime.  . cyanocobalamin 100 MCG tablet Take 1,000 mcg by mouth daily.   . Melatonin 3 MG TABS Take 3 mg by mouth at bedtime.   . pramipexole (MIRAPEX) 0.75 MG tablet TAKE 1 TABLET (0.75 MG TOTAL) BY MOUTH 3 (THREE) TIMES DAILY.   No facility-administered encounter medications on file as of 05/17/2020.    Objective:   PHYSICAL EXAMINATION:    VITALS:     Vitals:   05/17/20 0800  BP: 119/71  Pulse: 66  SpO2: 95%  Weight: 205 lb (93 kg)  Height: 5\' 6"  (1.676 m)    GEN:  The patient appears stated age and is in NAD. HEENT:  Normocephalic, atraumatic.  The mucous membranes are moist. The superficial temporal arteries are without ropiness or tenderness. CV:  RRR Lungs:  CTAB Neck/HEME:  There are no carotid bruits bilaterally.  Neurological examination:  Orientation: The patient is alert and oriented x3. Cranial nerves: There is good facial symmetry with mild facial hypomimia. The speech is fluent and mildly dysarthric. Soft palate rises symmetrically and there is no tongue deviation. Hearing is intact to conversational tone. Sensation: Sensation is intact to light touch throughout Motor: Strength is at least antigravity x4.  Movement examination: Tone: There is nl tone in the ue/le Abnormal movements: rare rue rest tremor Coordination:  There is no decremation with RAM's, Gait and Station: The patient has no difficulty arising out of a deep-seated chair without the use of the hands. The patient's stride length is slightly decreased.  Turns en bloc.    I have reviewed and interpreted the following labs independently    Chemistry      Component Value Date/Time   NA 140 04/12/2018 0807   K 4.5 04/12/2018 0807   CL 104 04/12/2018 0807   CO2 29 04/12/2018 0807   BUN 16 04/12/2018 0807   CREATININE 1.09 04/12/2018 5701  CREATININE 0.94 09/26/2014 0925      Component Value Date/Time   CALCIUM 10.1 04/12/2018 0807   ALKPHOS 57 04/12/2018 0807   AST 21 04/12/2018 0807   ALT 35 04/12/2018 0807   BILITOT 0.8 04/12/2018 0807       Lab Results  Component Value Date   WBC 9.1 04/12/2018   HGB 15.5 04/12/2018   HCT 45.4 04/12/2018   MCV 98.0 04/12/2018   PLT 307.0 04/12/2018    Lab Results  Component Value Date   TSH 1.67 03/27/2015     Total time spent on today's visit was 30 minutes, including both face-to-face  time and nonface-to-face time.  Time included that spent on review of records (prior notes available to me/labs/imaging if pertinent), discussing treatment and goals, answering patient's questions and coordinating care.  Cc:  [name], DO

## 2020-05-17 ENCOUNTER — Ambulatory Visit (INDEPENDENT_AMBULATORY_CARE_PROVIDER_SITE_OTHER): Payer: Medicare Other | Admitting: Neurology

## 2020-05-17 ENCOUNTER — Encounter: Payer: Self-pay | Admitting: Neurology

## 2020-05-17 ENCOUNTER — Other Ambulatory Visit: Payer: Self-pay

## 2020-05-17 VITALS — BP 119/71 | HR 66 | Ht 66.0 in | Wt 205.0 lb

## 2020-05-17 DIAGNOSIS — G20 Parkinson's disease: Secondary | ICD-10-CM | POA: Diagnosis not present

## 2020-05-17 DIAGNOSIS — G4700 Insomnia, unspecified: Secondary | ICD-10-CM

## 2020-05-17 MED ORDER — CLONAZEPAM 0.5 MG PO TABS: 0.5000 mg | ORAL_TABLET | Freq: Every day | ORAL | 1 refills | Status: DC

## 2020-05-17 NOTE — Patient Instructions (Signed)
1.  Increase clonazepam to 1 FULL tablet at bedtime 2.  Check out the programs at [facility]  The physicians and staff at [facility] are committed to providing excellent care. You may receive a survey requesting feedback about your experience at our office. We strive to receive "very good" responses to the survey questions. If you feel that your experience would prevent you from giving the office a "very good " response, please contact our office to try to remedy the situation. We may be reached at [phone]. Thank you for taking the time out of your busy day to complete the survey.

## 2020-05-29 ENCOUNTER — Ambulatory Visit: Payer: Medicare Other | Admitting: Pulmonary Disease

## 2020-05-30 ENCOUNTER — Other Ambulatory Visit: Payer: Self-pay | Admitting: Family Medicine

## 2020-05-30 DIAGNOSIS — K219 Gastro-esophageal reflux disease without esophagitis: Secondary | ICD-10-CM

## 2020-06-07 ENCOUNTER — Ambulatory Visit: Payer: Medicare Other | Admitting: *Deleted

## 2020-06-26 ENCOUNTER — Other Ambulatory Visit: Payer: Self-pay | Admitting: Family Medicine

## 2020-06-26 DIAGNOSIS — K219 Gastro-esophageal reflux disease without esophagitis: Secondary | ICD-10-CM

## 2020-06-28 ENCOUNTER — Other Ambulatory Visit: Payer: Self-pay

## 2020-06-28 ENCOUNTER — Ambulatory Visit (INDEPENDENT_AMBULATORY_CARE_PROVIDER_SITE_OTHER): Payer: Medicare Other

## 2020-06-28 ENCOUNTER — Ambulatory Visit (INDEPENDENT_AMBULATORY_CARE_PROVIDER_SITE_OTHER): Payer: Medicare Other | Admitting: Pulmonary Disease

## 2020-06-28 ENCOUNTER — Encounter: Payer: Self-pay | Admitting: Pulmonary Disease

## 2020-06-28 VITALS — BP 122/72 | HR 74 | Temp 98.2°F | Ht 66.0 in | Wt 206.8 lb

## 2020-06-28 DIAGNOSIS — R0600 Dyspnea, unspecified: Secondary | ICD-10-CM

## 2020-06-28 DIAGNOSIS — K219 Gastro-esophageal reflux disease without esophagitis: Secondary | ICD-10-CM

## 2020-06-28 DIAGNOSIS — R0609 Other forms of dyspnea: Secondary | ICD-10-CM

## 2020-06-28 DIAGNOSIS — G20 Parkinson's disease: Secondary | ICD-10-CM | POA: Diagnosis not present

## 2020-06-28 DIAGNOSIS — Z23 Encounter for immunization: Secondary | ICD-10-CM

## 2020-06-28 DIAGNOSIS — Z0000 Encounter for general adult medical examination without abnormal findings: Secondary | ICD-10-CM

## 2020-06-28 DIAGNOSIS — G20A1 Parkinson's disease without dyskinesia, without mention of fluctuations: Secondary | ICD-10-CM

## 2020-06-28 DIAGNOSIS — R0602 Shortness of breath: Secondary | ICD-10-CM | POA: Diagnosis not present

## 2020-06-28 LAB — PULMONARY FUNCTION TEST
DL/VA % pred: 90 %
DL/VA: 3.74 ml/min/mmHg/L
DLCO cor % pred: 91 %
DLCO cor: 21.14 ml/min/mmHg
DLCO unc % pred: 91 %
DLCO unc: 21.14 ml/min/mmHg
FEF 25-75 Post: 2.77 L/sec
FEF 25-75 Pre: 2.71 L/sec
FEF2575-%Change-Post: 2 %
FEF2575-%Pred-Post: 128 %
FEF2575-%Pred-Pre: 125 %
FEV1-%Change-Post: 0 %
FEV1-%Pred-Post: 103 %
FEV1-%Pred-Pre: 103 %
FEV1-Post: 2.9 L
FEV1-Pre: 2.9 L
FEV1FVC-%Change-Post: 9 %
FEV1FVC-%Pred-Pre: 106 %
FEV6-%Change-Post: -7 %
FEV6-%Pred-Post: 94 %
FEV6-%Pred-Pre: 101 %
FEV6-Post: 3.37 L
FEV6-Pre: 3.63 L
FEV6FVC-%Change-Post: 1 %
FEV6FVC-%Pred-Post: 106 %
FEV6FVC-%Pred-Pre: 104 %
FVC-%Change-Post: -8 %
FVC-%Pred-Post: 88 %
FVC-%Pred-Pre: 96 %
FVC-Post: 3.38 L
FVC-Pre: 3.69 L
Post FEV1/FVC ratio: 86 %
Post FEV6/FVC ratio: 100 %
Pre FEV1/FVC ratio: 79 %
Pre FEV6/FVC Ratio: 98 %
RV % pred: 65 %
RV: 1.45 L
TLC % pred: 81 %
TLC: 5.08 L

## 2020-06-28 NOTE — Assessment & Plan Note (Signed)
Plan: High-dose seasonal flu vaccine

## 2020-06-28 NOTE — Progress Notes (Signed)
PFT done today. 

## 2020-06-28 NOTE — Assessment & Plan Note (Signed)
Reviewed pulmonary function testing today No formal obstruction or restriction seen on pulmonary function testing Do believe the patient's shortness of breath is likely multifactorial Lower extremity swelling seen the patient reports this is chronic and weights are stable.  He reports lower extremity swelling has been present since 2019. No chest x-ray imaging within the last year  Plan: We will see patient back in 6 months or sooner if symptoms worsen Chest x-ray today We can monitor patient clinically if shortness of breath starts to worsen can consider repeating pulmonary function testing at 1 year mark or sooner based off of clinical symptoms If lower extremity swelling worsens or weights increase patient encouraged to contact primary care as he may benefit from a proBNP lab test or repeat echocardiogram Encourage patient to contact [facility] as previously recommended by neurology to help with physical deconditioning

## 2020-06-28 NOTE — Assessment & Plan Note (Signed)
Having breakthrough reflux episodes Using Pepcid as needed Previously well controlled with PPI/Protonix Protonix was stopped by primary care  Plan: Emphasized GERD lifestyle choices Encourage patient to start taking Pepcid daily Encourage patient to contact primary care to notify them that he is having to take Pepcid daily If symptoms persist despite daily Pepcid use may need to consider trial of PPI again

## 2020-06-28 NOTE — Patient Instructions (Addendum)
You were seen today by Lauraine Rinne, NP  for:   1. DOE (dyspnea on exertion)  - DG Chest 2 View; Future  Reviewed pulmonary function testing  We will obtain chest x-ray today  If your lower extremity swelling starts to worsen or your weights begin to increase please follow-up with primary care to obtain lab work to evaluate potential fluid overload, primary care may need to coordinate repeating your echocardiogram based off these findings  2. Healthcare maintenance  Seasonal flu vaccine today  3. Parkinson disease ([place])  Continue to follow-up with neurology  Please contact rehab without walls as previously instructed by neurology  4. Gastroesophageal reflux disease, unspecified whether esophagitis present  Okay to start taking Pepcid daily  Notify primary care they are having more breakthrough reflux symptoms that you started taking Pepcid daily  If you have breakthrough reflux symptoms okay to take Tums in addition to the daily Pepcid  If symptoms persist despite daily Pepcid use as well as occasional Tums then please contact primary care they may need to consider placing her back on a PPI such as Protonix  GERD management: >>>Avoid laying flat until 2 hours after meals >>>Elevate head of the bed including entire chest >>>Reduce size of meals and amount of fat, acid, spices, caffeine and sweets >>>If you are smoking, Please stop! >>>Decrease alcohol consumption >>>Work on maintaining a healthy weight with normal BMI      We recommend today:  Orders Placed This Encounter  Procedures  . DG Chest 2 View    Standing Status:   Future    Standing Expiration Date:   10/29/2020    Order Specific Question:   Reason for Exam (SYMPTOM  OR DIAGNOSIS REQUIRED)    Answer:   doe    Order Specific Question:   Preferred imaging location?    Answer:   Internal    Order Specific Question:   Radiology Contrast Protocol - do NOT remove file path    Answer:    [email or web address]  . Flu vaccine HIGH DOSE PF (Fluzone High dose)   Orders Placed This Encounter  Procedures  . DG Chest 2 View  . Flu vaccine HIGH DOSE PF (Fluzone High dose)   No orders of the defined types were placed in this encounter.   Follow Up:    Return in about 6 months (around 12/27/2020), or if symptoms worsen or fail to improve, for Follow up with Dr. Valeta Harms.   Notification of test results are managed in the following manner: If there are  any recommendations or changes to the  plan of care discussed in office today,  we will contact you and let you know what they are. If you do not hear from [place], then your results are normal and you can view them through your  MyChart account , or a letter will be sent to you. Thank you again for trusting [place] with your care  - Thank you, [facility]    It is flu season:   >>> Best ways to protect herself from the flu: Receive the yearly flu vaccine, practice good hand hygiene washing with soap and also using hand sanitizer when available, eat a nutritious meals, get adequate rest, hydrate appropriately       Please contact the office if your symptoms worsen or you have concerns that you are not improving.   Thank you for choosing [facility] for your healthcare, and for allowing [place] to partner with you on your  healthcare journey. I am thankful to be able to provide care to you today.   Wyn Quaker FNP-C    Influenza Virus Vaccine injection What is this medicine? INFLUENZA VIRUS VACCINE (in floo EN zuh VAHY ruhs vak SEEN) helps to reduce the risk of getting influenza also known as the flu. The vaccine only helps protect you against some strains of the flu. This medicine may be used for other purposes; ask your health care provider or pharmacist if you have questions. COMMON BRAND NAME(S): Afluria, Afluria Quadrivalent, Agriflu, Alfuria, FLUAD, Fluarix, Fluarix  Quadrivalent, Flublok, Flublok Quadrivalent, FLUCELVAX, FLUCELVAX Quadrivalent, Flulaval, Flulaval Quadrivalent, Fluvirin, Fluzone, Fluzone High-Dose, Fluzone Intradermal, Fluzone Quadrivalent What should I tell my health care provider before I take this medicine? They need to know if you have any of these conditions:  bleeding disorder like hemophilia  fever or infection  Guillain-Barre syndrome or other neurological problems  immune system problems  infection with the human immunodeficiency virus (HIV) or AIDS  low blood platelet counts  multiple sclerosis  an unusual or allergic reaction to influenza virus vaccine, latex, other medicines, foods, dyes, or preservatives. Different brands of vaccines contain different allergens. Some may contain latex or eggs. Talk to your doctor about your allergies to make sure that you get the right vaccine.  pregnant or trying to get pregnant  breast-feeding How should I use this medicine? This vaccine is for injection into a muscle or under the skin. It is given by a health care professional. A copy of Vaccine Information Statements will be given before each vaccination. Read this sheet carefully each time. The sheet may change frequently. Talk to your healthcare provider to see which vaccines are right for you. Some vaccines should not be used in all age groups. Overdosage: If you think you have taken too much of this medicine contact a poison control center or emergency room at once. NOTE: This medicine is only for you. Do not share this medicine with others. What if I miss a dose? This does not apply. What may interact with this medicine?  chemotherapy or radiation therapy  medicines that lower your immune system like etanercept, anakinra, infliximab, and adalimumab  medicines that treat or prevent blood clots like warfarin  phenytoin  steroid medicines like prednisone or cortisone  theophylline  vaccines This list may not  describe all possible interactions. Give your health care provider a list of all the medicines, herbs, non-prescription drugs, or dietary supplements you use. Also tell them if you smoke, drink alcohol, or use illegal drugs. Some items may interact with your medicine. What should I watch for while using this medicine? Report any side effects that do not go away within 3 days to your doctor or health care professional. Call your health care provider if any unusual symptoms occur within 6 weeks of receiving this vaccine. You may still catch the flu, but the illness is not usually as bad. You cannot get the flu from the vaccine. The vaccine will not protect against colds or other illnesses that may cause fever. The vaccine is needed every year. What side effects may I notice from receiving this medicine? Side effects that you should report to your doctor or health care professional as soon as possible:  allergic reactions like skin rash, itching or hives, swelling of the face, lips, or tongue Side effects that usually do not require medical attention (report to your doctor or health care professional if they continue or are bothersome):  fever  headache  muscle aches and pains  pain, tenderness, redness, or swelling at the injection site  tiredness This list may not describe all possible side effects. Call your doctor for medical advice about side effects. You may report side effects to FDA at 1-800-FDA-1088. Where should I keep my medicine? The vaccine will be given by a health care professional in a clinic, pharmacy, doctor's office, or other health care setting. You will not be given vaccine doses to store at home. NOTE: This sheet is a summary. It may not cover all possible information. If you have questions about this medicine, talk to your doctor, pharmacist, or health care provider.  2020 Elsevier/Gold Standard (2018-08-03 08:45:43)

## 2020-06-28 NOTE — Progress Notes (Signed)
PCCM: thanks for seeing him Garner Nash, DO [facility] 06/28/2020 11:09 PM

## 2020-06-28 NOTE — Assessment & Plan Note (Signed)
Plan: Keep follow-up with neurology Contact rehab without walls

## 2020-06-28 NOTE — Progress Notes (Signed)
@Patient  ID: [name] [name], male    DOB: [birth year], 69 y.o.   MRN: [id number]  Chief Complaint  Patient presents with  . Follow-up    DOE, SOB    Referring provider: [name]*  HPI:  69 year old male never smoker initially referred to our office for dyspnea on exertion  PMH: Non-Hodgkin's lymphoma, mage syndrome, dysphagia, Parkinson's disease, thoracic kyphosis Smoker/ Smoking History: Never smoker Maintenance:   Pt of: Dr. Valeta Harms  06/28/2020  - Visit   69 year old male never smoker initially referred to our office in November/2019 and establish care with Dr. Lake Bells for dyspnea on exertion.  He was later seen in August/2021 by Dr. Valeta Harms for similar findings of dyspnea plan of care from August/2021 was as follows:  Obtain pulmonary function testing.  Suspect that patient may have mild restriction due to thoracic kyphosis as stated above.  Could consider repeat echocardiogram if patient starts to develop other signs of heart failure clinically.  Patient presents today after completing pulmonary function testing.  Those results are listed below:  06/28/2020-pulmonary function test-FVC 3.69 (96% predicted), postbronchodilator ratio 86, postbronchodilator FEV1 2.90 (103% predicted), TLC 5.08 (81% predicted), DLCO 21.14 (90% predicted)  Patient has also completed recent follow-up with neurology.  That visit was on 05/17/2020.  Their assessment and plan as listed below:  1.  Parkinsons Disease             -continue pramipexole, 0.75 mg 3 times per day.             -Continue carbidopa/levodopa 25/100, 1 tablet 3 times per day.  2. Meige syndrome             -stable. No significant increase dysphagia  3. Insomnia and mild RBD             -increase clonazepam, 0.5 mg, 1 tablet at bed. PDMP reviewed. No red flags.  4  Memory change             -suspect MCI             -declines neurocognitive testing  Patient reports that he has not contacted rehab without  walls as previously recommended by neurology.  He is actually decreased his clonazepam dose to 0.25 mg nightly.  He felt the 0.5 mg dose was too strong.   Tests:   06/28/2020-pulmonary function test-FVC 3.69 (96% predicted), postbronchodilator ratio 86, postbronchodilator FEV1 2.90 (103% predicted), TLC 5.08 (81% predicted), DLCO 21.14 (90% predicted)  07/23/2018-echocardiogram-LV ejection fraction 60 to 65%, small muscular VSD, mild AR, moderate PR  FENO:  No results found for: NITRICOXIDE  PFT: PFT Results Latest Ref Rng & Units 06/28/2020  FVC-Pre L 3.69  FVC-Predicted Pre % 96  FVC-Post L 3.38  FVC-Predicted Post % 88  Pre FEV1/FVC % % 79  Post FEV1/FCV % % 86  FEV1-Pre L 2.90  FEV1-Predicted Pre % 103  FEV1-Post L 2.90  DLCO uncorrected ml/min/mmHg 21.14  DLCO UNC% % 91  DLCO corrected ml/min/mmHg 21.14  DLCO COR %Predicted % 91  DLVA Predicted % 90  TLC L 5.08  TLC % Predicted % 81  RV % Predicted % 65    WALK:  SIX MIN WALK 07/27/2018  Supplimental Oxygen during Test? (L/min) No  Tech Comments: Patient was able to complete all 3 laps without stopping. He did state that he felt winded after his walk. Denied any symptoms of chest or leg pain. No o2 needed during or after walk.  Imaging: No results found.  Lab Results:  CBC    Component Value Date/Time   WBC 9.1 04/12/2018 0807   RBC 4.64 04/12/2018 0807   HGB 15.5 04/12/2018 0807   HGB 15.8 05/20/2012 0839   HGB 15.4 02/10/2008 0844   HCT 45.4 04/12/2018 0807   HCT 45.1 05/20/2012 0839   HCT 45.0 02/10/2008 0844   PLT 307.0 04/12/2018 0807   PLT 289 05/20/2012 0839   PLT 261 02/10/2008 0844   MCV 98.0 04/12/2018 0807   MCV 95 05/20/2012 0839   MCV 97.2 02/10/2008 0844   MCH 33.1 09/26/2014 0925   MCHC 34.2 04/12/2018 0807   RDW 14.1 04/12/2018 0807   RDW 14.9 05/20/2012 0839   RDW 14.8 (H) 02/10/2008 0844   LYMPHSABS 5.1 (H) 03/27/2015 0824   LYMPHSABS 4.0 (H) 05/20/2012 0839   LYMPHSABS 1.8  02/10/2008 0844   MONOABS 1.0 03/27/2015 0824   MONOABS 0.6 02/10/2008 0844   EOSABS 0.3 03/27/2015 0824   EOSABS 0.4 05/20/2012 0839   BASOSABS 0.1 03/27/2015 0824   BASOSABS 0.0 05/20/2012 0839   BASOSABS 0.0 02/10/2008 0844    BMET    Component Value Date/Time   NA 140 04/12/2018 0807   K 4.5 04/12/2018 0807   CL 104 04/12/2018 0807   CO2 29 04/12/2018 0807   GLUCOSE 98 04/12/2018 0807   BUN 16 04/12/2018 0807   CREATININE 1.09 04/12/2018 0807   CREATININE 0.94 09/26/2014 0925   CALCIUM 10.1 04/12/2018 0807   GFRNONAA 78 (L) 08/24/2014 1053   GFRAA >90 08/24/2014 1053    BNP No results found for: BNP  ProBNP No results found for: PROBNP  Specialty Problems      Pulmonary Problems   DOE (dyspnea on exertion)      No Known Allergies  Immunization History  Administered Date(s) Administered  . Fluad Quad(high Dose 65+) 07/14/2019  . Influenza Whole 09/06/2007, 06/28/2008  . Influenza, High Dose Seasonal PF 06/20/2016, 07/02/2017, 07/15/2018, 06/28/2020  . Influenza,inj,Quad PF,6+ Mos 07/10/2014, 06/11/2015  . Meningococcal B, OMV 04/02/2015  . Meningococcal Conjugate 04/02/2015, 06/11/2015  . PFIZER SARS-COV-2 Vaccination 11/04/2019, 11/29/2019  . Pneumococcal Conjugate-13 04/02/2015  . Pneumococcal Polysaccharide-23 06/23/2006, 07/01/2016  . Td 05/04/2006, 06/20/2016    Past Medical History:  Diagnosis Date  . Cervical lymphadenopathy    right - followed my ENT  . Dystonia 1994   diagnosed in [place]  . Non Hodgkin's lymphoma ([place]) 2007   diffuse- 6 cycles of chemo with R CHOP Rituxan - last dose 10/08  . Parkinson disease ([place])    2015    Tobacco History: Social History   Tobacco Use  Smoking Status Never Smoker  Smokeless Tobacco Never Used   Counseling given: No   Continue to not smoke  Outpatient Encounter Medications as of 06/28/2020  Medication Sig  . aspirin 81 MG chewable tablet Chew 81 mg by mouth daily.  . carbidopa-levodopa  (SINEMET IR) 25-100 MG tablet TAKE 1 TABLET BY MOUTH THREE TIMES A DAY  . Cholecalciferol (VITAMIN D3) 3000 UNITS TABS Take 1,000 Units by mouth daily.   . clonazePAM (KLONOPIN) 0.5 MG tablet Take 1 tablet (0.5 mg total) by mouth at bedtime.  . cyanocobalamin 100 MCG tablet Take 1,000 mcg by mouth daily.   . Melatonin 3 MG TABS Take 3 mg by mouth at bedtime.   . pramipexole (MIRAPEX) 0.75 MG tablet TAKE 1 TABLET (0.75 MG TOTAL) BY MOUTH 3 (THREE) TIMES DAILY.  . [DISCONTINUED] pantoprazole (PROTONIX)  40 MG tablet TAKE 1 TABLET BY MOUTH EVERY DAY   No facility-administered encounter medications on file as of 06/28/2020.     Review of Systems  Review of Systems  Constitutional: Negative for activity change, chills, fatigue, fever and unexpected weight change.  HENT: Negative for postnasal drip, rhinorrhea, sinus pressure, sinus pain and sore throat.   Eyes: Negative.   Respiratory: Positive for shortness of breath. Negative for cough and wheezing.   Cardiovascular: Positive for leg swelling. Negative for chest pain and palpitations.  Gastrointestinal: Negative for constipation, diarrhea, nausea and vomiting.  Endocrine: Negative.   Genitourinary: Negative.   Musculoskeletal: Negative.   Skin: Negative.   Neurological: Negative for dizziness and headaches.  Psychiatric/Behavioral: Negative.  Negative for dysphoric mood. The patient is not nervous/anxious.   All other systems reviewed and are negative.    Physical Exam  BP 122/72 (BP Location: Left Arm, Cuff Size: Normal)   Pulse 74   Temp 98.2 F (36.8 C) (Oral)   Ht 5\' 6"  (1.676 m)   Wt 206 lb 12.8 oz (93.8 kg)   SpO2 94%   BMI 33.38 kg/m   Wt Readings from Last 5 Encounters:  06/28/20 206 lb 12.8 oz (93.8 kg)  05/17/20 205 lb (93 kg)  05/09/20 206 lb 6 oz (93.6 kg)  04/26/20 206 lb 6.4 oz (93.6 kg)  03/07/20 203 lb 6 oz (92.3 kg)    BMI Readings from Last 5 Encounters:  06/28/20 33.38 kg/m  05/17/20 33.09 kg/m    05/09/20 33.31 kg/m  04/26/20 33.31 kg/m  03/07/20 32.83 kg/m     Physical Exam Vitals and nursing note reviewed.  Constitutional:      General: He is not in acute distress.    Appearance: Normal appearance. He is obese.  HENT:     Head: Normocephalic and atraumatic.     Right Ear: Hearing, tympanic membrane, ear canal and external ear normal.     Left Ear: Hearing, tympanic membrane, ear canal and external ear normal.     Nose: Nose normal. No mucosal edema or rhinorrhea.     Right Turbinates: Not enlarged.     Left Turbinates: Not enlarged.     Mouth/Throat:     Mouth: Mucous membranes are dry.     Pharynx: Oropharynx is clear. No oropharyngeal exudate.  Eyes:     Pupils: Pupils are equal, round, and reactive to light.  Cardiovascular:     Rate and Rhythm: Normal rate and regular rhythm.     Pulses: Normal pulses.     Heart sounds: Normal heart sounds. No murmur heard.   Pulmonary:     Effort: Pulmonary effort is normal.     Breath sounds: Normal breath sounds. No decreased breath sounds, wheezing or rales.  Musculoskeletal:     Cervical back: Normal range of motion.     Right lower leg: 1+ Edema (Compression stockings applied) present.     Left lower leg: 2+ Edema (Patient reports this is chronic, compression stockings applied) present.  Lymphadenopathy:     Cervical: No cervical adenopathy.  Skin:    General: Skin is warm and dry.     Capillary Refill: Capillary refill takes less than 2 seconds.     Findings: No erythema or rash.  Neurological:     General: No focal deficit present.     Mental Status: He is alert and oriented to person, place, and time.     Motor: No weakness.     Coordination:  Coordination normal.     Gait: Gait is intact. Gait normal.  Psychiatric:        Mood and Affect: Mood normal.        Behavior: Behavior normal. Behavior is cooperative.        Thought Content: Thought content normal.        Judgment: Judgment normal.        Assessment & Plan:   Parkinson disease ([place]) Plan: Keep follow-up with neurology Contact rehab without walls  Gastroesophageal reflux disease Having breakthrough reflux episodes Using Pepcid as needed Previously well controlled with PPI/Protonix Protonix was stopped by primary care  Plan: Emphasized GERD lifestyle choices Encourage patient to start taking Pepcid daily Encourage patient to contact primary care to notify them that he is having to take Pepcid daily If symptoms persist despite daily Pepcid use may need to consider trial of PPI again  Healthcare maintenance Plan: High-dose seasonal flu vaccine  DOE (dyspnea on exertion) Reviewed pulmonary function testing today No formal obstruction or restriction seen on pulmonary function testing Do believe the patient's shortness of breath is likely multifactorial Lower extremity swelling seen the patient reports this is chronic and weights are stable.  He reports lower extremity swelling has been present since 2019. No chest x-ray imaging within the last year  Plan: We will see patient back in 6 months or sooner if symptoms worsen Chest x-ray today We can monitor patient clinically if shortness of breath starts to worsen can consider repeating pulmonary function testing at 1 year mark or sooner based off of clinical symptoms If lower extremity swelling worsens or weights increase patient encouraged to contact primary care as he may benefit from a proBNP lab test or repeat echocardiogram Encourage patient to contact rehab without walls as previously recommended by neurology to help with physical deconditioning    Return in about 6 months (around 12/27/2020), or if symptoms worsen or fail to improve, for Follow up with Dr. Valeta Harms.   Lauraine Rinne, NP 06/28/2020   This appointment required 42 minutes of patient care (this includes precharting, chart review, review of results, face-to-face care, etc.).

## 2020-08-30 ENCOUNTER — Telehealth: Payer: Self-pay | Admitting: Occupational Therapy

## 2020-08-30 DIAGNOSIS — G20 Parkinson's disease: Secondary | ICD-10-CM

## 2020-08-30 NOTE — Telephone Encounter (Signed)
  Dr. Carles Collet,  Vilinda Blanks is scheduled for  OT, PT, and ST re-evaluations on 11/08/20 as recommended when he was last discharged from therapy.  Pt was in agreement with this plan.  If you are in agreement, please send updated order for OT, PT, and ST via epic.  Thank you,  Vianne Bulls, OTR/L [facility] [address] [place], [place]  [address] [phone] phone [phone] 08/30/20 11:58 AM

## 2020-10-02 ENCOUNTER — Ambulatory Visit: Payer: Medicare Other | Attending: Internal Medicine

## 2020-10-02 ENCOUNTER — Other Ambulatory Visit (HOSPITAL_BASED_OUTPATIENT_CLINIC_OR_DEPARTMENT_OTHER): Payer: Self-pay | Admitting: Internal Medicine

## 2020-10-02 DIAGNOSIS — Z23 Encounter for immunization: Secondary | ICD-10-CM

## 2020-10-02 MED FILL — PFIZER-BIONTECH COVID-19 VA: 30 | 21 days supply | Qty: 0 | Fill #0

## 2020-10-02 NOTE — Progress Notes (Signed)
   Covid-19 Vaccination Clinic  Name:  [name] [name]    MRN: [id number] DOB: 1951-06-14  10/02/2020  [name] was observed post Covid-19 immunization for 15 minutes without incident. He was provided with Vaccine Information Sheet and instruction to access the V-Safe system.   [name] was instructed to call 911 with any severe reactions post vaccine: Marland Kitchen Difficulty breathing  . Swelling of face and throat  . A fast heartbeat  . A bad rash all over body  . Dizziness and weakness   Immunizations Administered    Name Date Dose VIS Date Route   Pfizer COVID-19 Vaccine 10/02/2020 10:10 AM 0.3 mL 07/11/2020 Intramuscular   Manufacturer: [place]   Lot: [id number]   NDC: [address]-1

## 2020-10-04 ENCOUNTER — Telehealth: Payer: Self-pay | Admitting: Physical Therapy

## 2020-10-04 NOTE — Telephone Encounter (Signed)
Telephone encounter created in error.  Mady Haagensen, PT 10/04/20 11:39 AM Phone: [phone] Fax: [phone]

## 2020-10-25 ENCOUNTER — Other Ambulatory Visit: Payer: Self-pay | Admitting: Neurology

## 2020-10-25 NOTE — Telephone Encounter (Signed)
Rx(s) sent to pharmacy electronically.  

## 2020-11-08 ENCOUNTER — Ambulatory Visit: Payer: Medicare Other | Admitting: Occupational Therapy

## 2020-11-08 ENCOUNTER — Encounter: Payer: Self-pay | Admitting: Physical Therapy

## 2020-11-08 ENCOUNTER — Ambulatory Visit: Payer: Medicare Other | Attending: Neurology | Admitting: Physical Therapy

## 2020-11-08 ENCOUNTER — Encounter: Payer: Self-pay | Admitting: Occupational Therapy

## 2020-11-08 ENCOUNTER — Other Ambulatory Visit: Payer: Self-pay

## 2020-11-08 ENCOUNTER — Encounter: Payer: Medicare Other | Admitting: Speech Pathology

## 2020-11-08 DIAGNOSIS — R293 Abnormal posture: Secondary | ICD-10-CM | POA: Diagnosis not present

## 2020-11-08 DIAGNOSIS — R2689 Other abnormalities of gait and mobility: Secondary | ICD-10-CM | POA: Insufficient documentation

## 2020-11-08 DIAGNOSIS — R2681 Unsteadiness on feet: Secondary | ICD-10-CM

## 2020-11-08 DIAGNOSIS — R29818 Other symptoms and signs involving the nervous system: Secondary | ICD-10-CM

## 2020-11-08 DIAGNOSIS — R278 Other lack of coordination: Secondary | ICD-10-CM | POA: Insufficient documentation

## 2020-11-08 DIAGNOSIS — R251 Tremor, unspecified: Secondary | ICD-10-CM | POA: Insufficient documentation

## 2020-11-08 NOTE — Therapy (Signed)
[facility] [address] [place], [place], [address] Phone: [phone]   Fax:  [phone]  Physical Therapy Evaluation  Patient Details  Name: [name] [name] MRN: [id number] Date of Birth: [birth year] Referring Provider (PT): Wells Guiles, Tat DO   Encounter Date: 11/08/2020   PT End of Session - 11/08/20 1450    Visit Number 1    Number of Visits 9    Date for PT Re-Evaluation 01/07/21    Authorization Type Medicare/Aetna    PT Start Time 0938    PT Stop Time 1017    PT Time Calculation (min) 39 min    Activity Tolerance Patient tolerated treatment well    Behavior During Therapy [facility] for tasks assessed/performed           Past Medical History:  Diagnosis Date  . Cervical lymphadenopathy    right - followed my ENT  . Dystonia 1994   diagnosed in [place]  . Non Hodgkin's lymphoma ([place]) 2007   diffuse- 6 cycles of chemo with R CHOP Rituxan - last dose 10/08  . Parkinson disease ([place])    2015    Past Surgical History:  Procedure Laterality Date  . CHOLECYSTECTOMY, LAPAROSCOPIC    . EYE SURGERY     x2 as child  . PORT-A-CATH REMOVAL    . PORTACATH PLACEMENT    . SPLENECTOMY    . TONSILLECTOMY      There were no vitals filed for this visit.    Subjective Assessment - 11/08/20 0942    Subjective Was sick for a bit (not with COVID), and I've gotten over that.  When I was here last time, I was working on my feet getting stuck.  The tips work, but the getting stuck is getting worse.  The tremors in R hand are better.  When I stop and think, I can do better.  Never have fallen, but sometimes with first standing, I feel like I may fall.    Pertinent History non-hodgkins lymphoma, PD (diagnosed in 2015), cervical lymphadenopathy    Patient Stated Goals No specific goals-main thing I want is for the class (PWR! Moves) to get going again.    Currently in Pain? No/denies              [facility] PT Assessment -  11/08/20 0946      Assessment   Medical Diagnosis Parinson's Disease    Referring Provider (PT) Wells Guiles, Tat DO    Onset Date/Surgical Date 02/07/20   end of last bout of therapy   Hand Dominance Right    Prior Therapy has been seen at this location for PT, OT, speech      Precautions   Precautions Fall      Balance Screen   Has the patient fallen in the past 6 months No    Has the patient had a decrease in activity level because of a fear of falling?  No    Is the patient reluctant to leave their home because of a fear of falling?  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 - sometimes it is tough with his tremor, likes to volunteer at [employer]       Observation/Other [employer] on Therapeutic Outcomes (FOTO)  NA      Posture/Postural Control   Posture/Postural Control Postural limitations    Postural Limitations Forward head;Rounded Shoulders;Flexed trunk;Posterior pelvic tilt;Weight shift right   R lateral lean   Posture Comments Able to correct R lateral lean with cues      Strength   Overall Strength Deficits    Strength Assessment Site Hip;Knee;Ankle    Right/Left Hip Right;Left    Right Hip Flexion 4/5    Left Hip Flexion 4+/5    Right/Left Knee Right;Left    Right Knee Flexion 5/5    Right Knee Extension 4/5    Left Knee Flexion 5/5    Left Knee Extension 5/5    Right/Left  Ankle Right;Left    Right Ankle Dorsiflexion 4+/5    Left Ankle Dorsiflexion 4+/5      Transfers   Transfers Sit to Stand;Stand to Sit    Sit to Stand 6: Modified independent (Device/Increase time);Without upper extremity assist;From chair/3-in-1    Five time sit to stand comments  9.63    Stand to Sit 6: Modified independent (Device/Increase time);Without upper extremity assist;To chair/3-in-1      Ambulation/Gait   Ambulation/Gait Yes    Ambulation/Gait Assistance 6: Modified independent (Device/Increase time)    Ambulation Distance (Feet) 300 Feet    Assistive device None    Gait Pattern Step-through pattern;Decreased trunk rotation;Trunk flexed    Ambulation Surface Level;Indoor    Gait velocity 3.24 ft/sec    Stairs Yes    Stairs Assistance 6: Modified independent (Device/Increase time)    Stair Management Technique No rails;Alternating pattern;Forwards   Cues for full foot placement onto step with R foot   Number of Stairs 4    Height of Stairs 6      Standardized Balance Assessment   Standardized Balance Assessment Mini-BESTest;Timed Up and Go Test      Mini-BESTest   Sit To Stand Normal: Comes to stand without use of hands and stabilizes independently.    Rise to Toes Normal: Stable for 3 s with maximum height.    Stand on one leg (left) Moderate: < 20 s   2.87, 4.34   Stand on one leg (right) Moderate: < 20 s   1.5, 8.47   Stand on one leg - lowest score 1    Compensatory Stepping Correction - Forward Normal: Recovers independently with a single, large step (second realignement is allowed).    Compensatory Stepping Correction - Backward Moderate: More than one step is required to recover equilibrium   4 steps   Compensatory Stepping Correction - Left Lateral Normal: Recovers independently with 1 step (crossover or lateral OK)    Compensatory Stepping Correction - Right Lateral Normal: Recovers independently with 1 step (crossover or lateral OK)    Stepping Corredtion  Lateral - lowest score 2    Stance - Feet together, eyes open, firm surface  Normal: 30s    Stance - Feet together, eyes closed, foam surface  Normal: 30s    Incline - Eyes Closed Normal: Stands independently 30s and aligns  with gravity    Change in Gait Speed Normal: Significantly changes walkling speed without imbalance    Walk with head turns - Horizontal Normal: performs head turns with no change in gait speed and good balance    Walk with pivot turns Normal: Turns with feet close FAST (< 3 steps) with good balance.    Step over obstacles Normal: Able to step over box with minimal change of gait speed and with good balance.    Timed UP & GO with Dual Task Normal: No noticeable change in sitting, standing or walking while backward counting when compared to TUG without    Mini-BEST total score 26      Timed Up and Go Test   TUG Normal TUG;Manual TUG;Cognitive TUG    Normal TUG (seconds) 10.06    Manual TUG (seconds) 9.47    Cognitive TUG (seconds) 10.28                      Objective measurements completed on examination: See above findings.               PT Education - 11/08/20 1448    Education Details PT eval results (slowing of gait velocity, postural changes since last bout of PT); PT POC               PT Long Term Goals - 11/08/20 1457      PT LONG TERM GOAL #1   Title Pt will be independent with HEP and community fitness plans upon d/c from PT for continuing to maximize gains made in PT.  TARGET 12/07/2020    Time 4    Period Weeks    Status New      PT LONG TERM GOAL #2   Title Pt will improve gait velocity to at least 4 ft/sec for improved gait efficiency and safety in the community.    Time 4    Period Weeks    Status New      PT LONG TERM GOAL #3   Title Pt will perform posterior push and release test in 2 steps or less, for improved posterior balance recovery.    Time 4    Period Weeks    Status New      PT LONG TERM GOAL #4    Title Pt will negotiate at least 4 steps, 4 reps, with appropriate foot clearance with ascending and descending stairs.    Time 4    Period Weeks    Status New                  Plan - 11/08/20 1450    Clinical Impression Statement Pt is a 70 year old male with history of Parkinson's disease, who presents to OPPT for return eval as recommended from previous discharge, in May 2021.  Pt presents with postural instability, abnormal posture (increased R lateral trunk lean), decreased gait velocity, decreased timing and coordination of gati, decreased balance, slowed measures on TUG scores (though still WNL).   Since last bout of PT, therapist notes changes in posture as well as slowed gait velocity.  Pt remains very independent, though he admits not consistently performing HEP.  He will benefit from skilled PT to address the above stated deficits for improved overall functional mobility and decreased fall risk.    Personal Factors and Comorbidities Comorbidity 3+    Comorbidities non-hodgkins lymphoma, PD (diagnosed in 2015), cervical lymphadenopathy    Examination-Activity Limitations Stairs;Locomotion Level  Examination-Participation Restrictions Community Activity;Other   Exercise classes   Stability/Clinical Decision Making Evolving/Moderate complexity    Clinical Decision Making Low    Rehab Potential Good    PT Frequency 2x / week    PT Duration 4 weeks   plus eval   PT Treatment/Interventions ADLs/Self Care Home Management;Gait training;Stair training;Functional mobility training;Therapeutic activities;Patient/family education;Neuromuscular re-education;Balance training;Therapeutic exercise    PT Next Visit Plan Initiate HEP (May likely inclue PWR! Moves sitting/standing) in conjunction with OT; sit<>stand, working on postural strengthening and awareness; gait training and overall focus on intensity/amplitude of movement patterns and posture; stepping strategies for balance recovery.     Consulted and Agree with Plan of Care Patient           Patient will benefit from skilled therapeutic intervention in order to improve the following deficits and impairments:  Abnormal gait,Decreased balance,Difficulty walking,Decreased strength,Postural dysfunction  Visit Diagnosis: Other abnormalities of gait and mobility  Abnormal posture  Unsteadiness on feet     Problem List Patient Active Problem List   Diagnosis Date Noted  . Healthcare maintenance 06/28/2020  . Gastroesophageal reflux disease 03/07/2020  . Localized swelling of right lower extremity 04/12/2019  . DOE (dyspnea on exertion) 07/15/2018  . 10 year risk of MI or stroke 7.5% or greater 04/12/2018  . 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 Dysphagia, neurologic 07/27/2014  . Parkinson disease ([place]) 07/27/2014  . Dystonia 07/10/2014  . Low back pain 11/21/2011  . TRANSAMINASES, SERUM, ELEVATED 07/06/2008  . DIARRHEA 05/23/2008  . LYMPH NODE-ENLARGED 07/28/2007  . Non-Hodgkin lymphoma ([place]) 03/23/2007    Saloni Lablanc W. 11/08/2020, 3:00 PM Frazier Butt., PT  [facility] [address] [place], [place], [address] Phone: [phone]   Fax:  [phone]  Name: [name] [name] MRN: [id number] Date of Birth: [birth year]

## 2020-11-08 NOTE — Therapy (Signed)
[facility] [address] [place], [place], [address] Phone: [phone]   Fax:  [phone]  Occupational Therapy Evaluation  Patient Details  Name: [name] [name] MRN: [id number] Date of Birth: 1951-05-23 Referring Provider (OT): Dr. Wells Guiles Tat   Encounter Date: 11/08/2020   OT End of Session - 11/08/20 1738    Visit Number 1    Number of Visits 9    Date for OT Re-Evaluation 12/08/20    Authorization Type Medicare & Aetna--Covered at 100%    Authorization - Visit Number 1    Authorization - Number of Visits 10    Progress Note Due on Visit 10    OT Start Time 1020    OT Stop Time 1100    OT Time Calculation (min) 40 min    Activity Tolerance Patient tolerated treatment well    Behavior During Therapy [facility] for tasks assessed/performed           Past Medical History:  Diagnosis Date  . Cervical lymphadenopathy    right - followed my ENT  . Dystonia 1994   diagnosed in [place]  . Non Hodgkin's lymphoma ([place]) 2007   diffuse- 6 cycles of chemo with R CHOP Rituxan - last dose 10/08  . Parkinson disease ([place])    2015    Past Surgical History:  Procedure Laterality Date  . CHOLECYSTECTOMY, LAPAROSCOPIC    . EYE SURGERY     x2 as child  . PORT-A-CATH REMOVAL    . PORTACATH PLACEMENT    . SPLENECTOMY    . TONSILLECTOMY      There were no vitals filed for this visit.   Subjective Assessment - 11/08/20 1027    Subjective  My tremors are better now since my medication has been adjusted    Pertinent History Parkinson's Disease.   PMH:  non-hodgkin lymphoma with hx of chemo, dystonia, Meige syndrome    Patient Stated Goals improve handwriting    Currently in Pain? No/denies             [facility] OT Assessment - 11/08/20 1023      Assessment   Medical Diagnosis PD    Referring Provider (OT) Dr. Wells Guiles Tat    Onset Date/Surgical Date 02/07/20    Hand Dominance Right    Prior Therapy has been seen at  this location for PT, OT, speech.  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 - sometimes it is tough with his tremor, likes to volunteer at [employer]   Eating/Feeding --   difficulty with tremors with evening meal, uses LUE at times   Upper Body Bathing Modified independent    Lower Body Bathing Modified independent    Upper Body Dressing --   mod I   Lower Body Dressing Modified independent    Toilet Transfer Modified independent    Toileting - Clothing Manipulation Modified independent    [place] Transfer  Modified independent    Transfers/Ambulation Related to ADL's Pt reports min difficulty with getting out of low sofa, min difficulty getting up from floor (no difficulty with car or bed transfers)    ADL comments --      IADL   Shopping --   difficulty seperating money in wallet   Light Housekeeping --   min difficulty getting items from floor   Meal Prep --   does snack prep   Community Mobility Drives own vehicle    Medication Management --   wife helps remind patient   Prior Level of Function Financial Management wife performs.  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  Uses computer without significant difficulty.      Mobility   Mobility Status Freezing    Mobility Status Comments Pt reports freezing with initiation.--previous strategies help      Written Expression   Dominant Hand Right    Handwriting 75% legible   mod micrographia at ends of lines     Vision - History   Baseline Vision Wears glasses all the time   [facility]   Additional Comments legally  blind in L eye since childhood      Cognition   Overall Cognitive Status Impaired/Different from baseline    Memory --   pt reports mild memory changes not significantly affecting ADLs/IADLs     Observation/Other Assessments   Standing Functional Reach Test R-12.5", L-11.5"    Other Surveys  Select    Physical Performance Test   Yes    Simulated Eating Time (seconds) 13.44sec    Donning [place]offing Jacket Time (seconds) 11.75sec   pt's fleece jacket   Donning Doffing Jacket Comments fastening/unfastening 3 buttons in 28.68sec      Posture/Postural Control   Posture/Postural Control Postural limitations    Postural Limitations Forward head;Rounded Shoulders;Flexed trunk;Posterior pelvic tilt;Weight shift right    Posture Comments Able to correct R lateral lean with cues      Coordination   9 Hole Peg Test Right;Left    Right 9 Hole Peg Test 29.84    Left 9 Hole Peg Test 30.03    Box and Blocks R-47blocks, L-39blocks      Tone   Assessment Location Right Upper Extremity;Left Upper Extremity      ROM / Strength   AROM / PROM / Strength AROM      AROM   Overall AROM  Within functional limits for tasks performed    Overall AROM Comments BUEs      [name] Tone   [name] Tone Within Functional Limits      LUE Tone   LUE Tone Within Functional Limits                             OT Short Term Goals - 11/08/20 1816      OT SHORT TERM GOAL #1   Title ---             OT Long Term Goals - 11/08/20 1803      OT LONG TERM GOAL #1   Title Pt will verbalize understanding of strategies to incr ease/efficiency with ADLs/IADLs (including use of tremor compensation strategies for eating dinner, writing, getting money from wallet, getting items from floor/getting up from floor).--check LTGs 12/05/20    Time 4    Period Weeks    Status New      OT LONG TERM GOAL #2   Title  Pt will be independent with updated HEP    Time 4    Period Weeks    Status New      OT LONG  TERM GOAL #3   Title Pt will be able to write at least 3 sentences with at least 90% legibility.    Baseline 75%    Time 4    Period Weeks    Status New      OT LONG TERM GOAL #4   Title Pt will demo/report incr ease with seperating money in wallet.    Time 4    Period Weeks    Status New      OT LONG TERM GOAL #5   Title Pt will improve LUE functional reaching as shown by improving score on box and blocks test by at least 4.    Baseline 39 blocks    Time 4    Period Weeks    Status New                 Plan - 11/08/20 1739    Clinical Impression Statement Pt is a 70 y.o. male with Parkinson's Disease returns today per recommendation from last therapy d/c.  Pt is familiar to this therapist from previous occupational therapy and was last seen by OT 05/2018.  Pt reports that he is not currently exercising regularly.  Pt with PMH that includes: non-hodgkin lymphoma with hx of chemo, dystonia.  Pt presents today with bradykinesia, decr coordination, decr posture, tremor, and decr balance/functional mobility for ADLs/IADLs.  Pt would benefit from occupational thearpy to address these deficits, to update/restablish HEP, and to decr risk of future complications related to PD.    OT Occupational Profile and History Detailed Assessment- Review of Records and additional review of physical, cognitive, psychosocial history related to current functional performance    Occupational performance deficits (Please refer to evaluation for details): ADL's;IADL's;Leisure    Body Structure / Function / Physical Skills ADL;Balance;Improper spinal/pelvic alignment;Mobility;UE functional use;FMC;Coordination;IADL;Dexterity;GMC   bradykinesia   Cognitive Skills Memory    Rehab Potential Good    Clinical Decision Making Several treatment options, min-mod task modification necessary    Comorbidities Affecting Occupational Performance: May have comorbidities impacting occupational performance    Modification  or Assistance to Complete Evaluation  No modification of tasks or assist necessary to complete eval    OT Frequency 2x / week    OT Duration 4 weeks   +eval (or 9 total visits as may need to modify due to schedule conflicts)   OT Treatment/Interventions Self-care/ADL training;Therapeutic exercise;Functional Mobility Training;Balance training;Manual Therapy;Neuromuscular education;Ultrasound;Aquatic Therapy;Energy conservation;Therapeutic activities;Cryotherapy;DME and/or AE instruction;Cognitive remediation/compensation;Passive range of motion;Patient/family education;Moist Heat    Plan review/update HEP (PWR! moves quadruped, coordination/PWR! hands)    Consulted and Agree with Plan of Care Patient           Patient will benefit from skilled therapeutic intervention in order to improve the following deficits and impairments:   Body Structure / Function / Physical Skills: ADL,Balance,Improper spinal/pelvic alignment,Mobility,UE functional use,FMC,Coordination,IADL,Dexterity,GMC (bradykinesia) Cognitive Skills: Memory     Visit Diagnosis: Other symptoms and signs involving the nervous system  Other lack of coordination  Unsteadiness on feet  Abnormal posture  Other abnormalities of gait and mobility  Tremor    Problem List Patient Active Problem List   Diagnosis Date Noted  . Healthcare maintenance 06/28/2020  . Gastroesophageal reflux disease 03/07/2020  . Localized swelling of right lower extremity 04/12/2019  . DOE (dyspnea on exertion) 07/15/2018  .  10 year risk of MI or stroke 7.5% or greater 04/12/2018  . 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 Dysphagia, neurologic 07/27/2014  . Parkinson disease ([place]) 07/27/2014  . Dystonia 07/10/2014  . Low back pain 11/21/2011  . TRANSAMINASES, SERUM, ELEVATED 07/06/2008  . DIARRHEA 05/23/2008  . LYMPH NODE-ENLARGED 07/28/2007  . Non-Hodgkin lymphoma ([place])  03/23/2007    [facility] 11/08/2020, 6:20 PM  [facility] [address] [place], [place], [address] Phone: [phone]   Fax:  [phone]  Name: NYAN DUFRESNE MRN: [id number]  Date of Birth: [birth year]   Vianne Bulls, OTR/L [facility] [address] [place], [place]  [address] [phone] phone [phone] 11/08/20 6:20 PM

## 2020-11-08 NOTE — Progress Notes (Signed)
Assessment/Plan:   1.  Parkinsons Disease  -has a long day and we decided to add another dosage of carbidopa/levodopa to his med.  He will take carbidopa/levodopa 25/100 at 7am/11am/3pm/7pm  -Continue pramipexole, 0.75 mg 3 times per day  -increase exercise  2.  Meige syndrome  -Stable  -no tx needed right now  3.  Insomnia/mild RBD  -Continue clonazepam, 0.5 mg, 1/2 tablet at night.  Higher dosages didn't help and had hang over effect   Subjective:   [name] [name] was seen today in follow up for Parkinsons disease.  My previous records were reviewed prior to todays visit as well as outside records available to me. Wife with patient and accompanies the visit.  Tremor is "great."  Pt denies falls but noting some balance change.  Noting some feet "sticking" esp late evening but other times as well.   Is not exercising.   No feet cramping at bed. Pt denies lightheadedness, near syncope.  No hallucinations.  We tried to raise the klonopin but he was getting hang over effect and he went back to 1/2 tablet. Saw the nurse practitioner pulmonary on October 7 for dyspnea on exertion.  Chest x-ray negative.  Current prescribed movement disorder medications: Pramipexole, 0.75 mg 3 times daily Carbidopa/levodopa 25/100, 1 tablet 3 times per day (7am/11am/4pm) - goes to bed at 11pm clonazepam, 0.5 mg, 1 tablet at bedtime (increased last visit)   ALLERGIES:  No Known Allergies  CURRENT MEDICATIONS:  Outpatient Encounter Medications as of 11/13/2020  Medication Sig  . aspirin 81 MG chewable tablet Chew 81 mg by mouth daily.  . carbidopa-levodopa (SINEMET IR) 25-100 MG tablet TAKE 1 TABLET BY MOUTH THREE TIMES A DAY  . Cholecalciferol (VITAMIN D3) 3000 UNITS TABS Take 1,000 Units by mouth daily.   . clonazePAM (KLONOPIN) 0.5 MG tablet Take 1 tablet (0.5 mg total) by mouth at bedtime. (Patient taking differently: Take 0.25 mg by mouth at bedtime.)  . cyanocobalamin 100 MCG tablet Take 1,000  mcg by mouth daily.   . famotidine (PEPCID) 10 MG tablet Take 10 mg by mouth 2 (two) times daily.  . Melatonin 3 MG TABS Take 3 mg by mouth at bedtime.   . pramipexole (MIRAPEX) 0.75 MG tablet TAKE 1 TABLET (0.75 MG TOTAL) BY MOUTH 3 (THREE) TIMES DAILY.   No facility-administered encounter medications on file as of 11/13/2020.    Objective:   PHYSICAL EXAMINATION:    VITALS:   Vitals:   11/13/20 0813  BP: 104/66  Pulse: 67  SpO2: 97%  Weight: 208 lb (94.3 kg)  Height: 5\' 6"  (1.676 m)    GEN:  The patient appears stated age and is in NAD. HEENT:  Normocephalic, atraumatic.  The mucous membranes are moist. The superficial temporal arteries are without ropiness or tenderness. CV:  RRR Lungs:  CTAB Neck/HEME:  There are no carotid bruits bilaterally.  Neurological examination:  Orientation: The patient is alert and oriented x3. Cranial nerves: There is good facial symmetry with facial hypomimia. The speech is fluent and pseudobulbar. Soft palate rises symmetrically and there is no tongue deviation. Hearing is intact to conversational tone. Sensation: Sensation is intact to light touch throughout Motor: Strength is at least antigravity x4.  Movement examination: Tone: There is nl tone in the ue/le Abnormal movements: none Coordination:  There is no decremation with RAM's, with any form of RAMS, including alternating supination and pronation of the forearm, hand opening and closing, finger taps, heel taps  and toe taps. Gait and Station: The patient has no difficulty arising out of a deep-seated chair without the use of the hands. The patient's stride length is good with pisa syndrome to the R.    I have reviewed and interpreted the following labs independently    Chemistry      Component Value Date/Time   NA 140 04/12/2018 0807   K 4.5 04/12/2018 0807   CL 104 04/12/2018 0807   CO2 29 04/12/2018 0807   BUN 16 04/12/2018 0807   CREATININE 1.09 04/12/2018 0807   CREATININE  0.94 09/26/2014 0925      Component Value Date/Time   CALCIUM 10.1 04/12/2018 0807   ALKPHOS 57 04/12/2018 0807   AST 21 04/12/2018 0807   ALT 35 04/12/2018 0807   BILITOT 0.8 04/12/2018 0807       Lab Results  Component Value Date   WBC 9.1 04/12/2018   HGB 15.5 04/12/2018   HCT 45.4 04/12/2018   MCV 98.0 04/12/2018   PLT 307.0 04/12/2018    Lab Results  Component Value Date   TSH 1.67 03/27/2015     Total time spent on today's visit was 30 minutes, including both face-to-face time and nonface-to-face time.  Time included that spent on review of records (prior notes available to me/labs/imaging if pertinent), discussing treatment and goals, answering patient's questions and coordinating care.  Cc:  [name], DO

## 2020-11-13 ENCOUNTER — Other Ambulatory Visit: Payer: Self-pay

## 2020-11-13 ENCOUNTER — Encounter: Payer: Self-pay | Admitting: Neurology

## 2020-11-13 ENCOUNTER — Ambulatory Visit (INDEPENDENT_AMBULATORY_CARE_PROVIDER_SITE_OTHER): Payer: Medicare Other | Admitting: Neurology

## 2020-11-13 VITALS — BP 104/66 | HR 67 | Ht 66.0 in | Wt 208.0 lb

## 2020-11-13 DIAGNOSIS — G20 Parkinson's disease: Secondary | ICD-10-CM | POA: Diagnosis not present

## 2020-11-13 MED ORDER — CARBIDOPA-LEVODOPA 25-100 MG PO TABS: 1.0000 | ORAL_TABLET | Freq: Four times a day (QID) | ORAL | 1 refills | Status: DC

## 2020-11-13 MED ORDER — CLONAZEPAM 0.5 MG PO TABS: 0.2500 mg | ORAL_TABLET | Freq: Every day | ORAL | 0 refills | Status: DC

## 2020-11-13 NOTE — Patient Instructions (Addendum)
Take carbidopa/levodopa at 7am/11am/3pm7pm  Online Resources for Power over Parkinson's Group February 2022  . Local [place] Online Groups  o Power over [employer] Group :   - Power Over Parkinson's Patient Education Group will be Wednesday, February 9th at 2pm via Zoom.   - Upcoming Power over Parkinson's Meetings:  2nd Wednesdays of the month at 2 pm:       March 9th, April 13th - Contact Amy Marriott at [email or web address] if interested in participating in this online group o Parkinson's Care Partners Group:    3rd Mondays, Contact Corwin Levins o Atypical Parkinsonian Patient Group:   4th Wednesdays, Contact Corwin Levins o If you are interested in participating in these online groups with Judson Roch, please contact her directly for how to join those meetings.  Her contact information is [email or web address].  She will send you a link to join the [employer].  (Please note that Corwin Levins , MSW, LCSW, has resigned her position at [facility], but will continue to lead the online groups temporarily)  . [place]:  [email or web address] o PD Health at Home continues:  Mindfulness Mondays, Expert Briefing Tuesdays, Wellness Wednesdays, Take Time Thursdays, Fitness Fridays -Listings for February 2022 are on the website o Upcoming Webinar:  Sights, Sounds, and Parkinson's.  Wednesday, February 2 @ 1 pm o Upcoming Webinar:  Conversations about Complementary Therapies and PD.  Wednesday, March 2 @ 1 pm o Geneticist, molecular) at [email or web address] o  Please check out their website to sign up for emails and see their full online offerings  . [place]:  [email or web address]  o Upcoming Webinar:   Moving with Mood Changes in Aging and Parkinson's:  A Look at Depression and Anxiety.  (This is a replay of a webinar originally aired June 2020)  Thursday, February 17 @ 12 noon o Check out additional information on their  website to see their full online offerings  . [place]:  [email or web address] o Upcoming Webinar:  The [place].  The Parkinson's You Don't See:  When your Autonomic System goes Off-Track.   Friday, February 18th.  Go to [email or web address], then click on Events, 08-26-1974 tab to register. o Care Partner Monthly Meetup.  With [employer] [place].  First Tuesday of each month, 2 pm o Check out additional information to Live Well Today on their website  . Parkinson and Movement Disorders (PMD) Alliance:  www.pmdalliance.org o [employer] Online:  Online Education Events o Sign up for emails, which are sent weekly to give you updates on programming and online offerings . Parkinson's Association of the [place]:  www.parkinsonassociation.org o Information on online support groups, education events, and online exercises including Yoga, Parkinson's exercises and more-LOTS of information on links to PD resources and online events o Virtual Support Group through Parkinson's Association of the [place]; next one is scheduled for Wednesday, November 21, 2020 at 2 pm. (These are typically scheduled for the 1st Wednesday of the month at 2 pm).  Visit website for details.  . Additional links for movement activities: o PWR! Moves Classes at [place] RESUMED (but are ON HOLD DUE TO COVID in February)  Contact Amy Rosebud, PT [email or web address] or [phone] if interested o Here is a link to the PWR!Moves classes on Zoom from [phone] - Daily Mon-Sat at 10:00. Via Zoom, FREE and open to all.  There is also a link below via Facebook if you use that  platform. - [email or web address].si - [email or web address]://www.[email or web address].no o Parkinson's Wellness Recovery (PWR! Moves)  www.[email or web address] - Info on the PWR! Virtual Experience:  You will have access to our expertise through self-assessment, guided plans that start with the PD-specific fundamentals, educational content, tips, Q&A with an expert, and a growing Art therapist of PD-specific pre-recorded and live exercise classes of varying types and intensity - both physical and cognitive! If that is not enough, we offer 1:1 wellness consultations (in-person or virtual) to personalize your PWR! Research scientist (medical).  - Check out the PWR! Move of the month on the [facility] website:  [email or web address] o [employer] Fridays:  - As part of the PD Health @ Home program, this free video series focuses each week on one aspect of fitness designed to support people living with Parkinson's.  These weekly videos highlight the [place] recent fitness guidelines for people with Parkinson's disease. -  HollywoodSale.dk o Dance for PD website is offering free, live-stream classes throughout the week, as well as links to [employer] of classes:  [email or web address] o Dance for Parkinson's Class:  [place].  Free offering for people with Parkinson's and care partners; virtual class.  o For more information, contact [phone] or email Ruffin Frederick at [email or web address] o Virtual dance and Pilates for Parkinson's classes: Click on the Community Tab> Parkinson's Movement Initiative Tab.  To register for classes and for more information, visit www.[email or web address].za and click  the "community" tab.    o [employer] Parkinson's Cycling Classes  - [facility] [employer]: 1pm on Fridays-Live classes at [facility] [employer] at [email or web address] or [phone]) [facility] [employer]: Virtual Classes Mondays and Thursdays (contact [place] at [email or web address] or [phone])   o [place] levels of classes are offered Tuesdays and Thursdays:  10:30 am,  12 noon & 1:45 pm at [facility]. To observe a class or for  more information, call [phone] or email [email or web address] - PD Flow Yoga for Parkinson's:  Fridays 1-2 pm, January 7-November 16, 2020 . [employer]Spring [employer]: o Chief Technology Officer Opportunities:  www.well-springsolutions.org/caregiver-education/caregiver-support-group.  You may also contact Vickki Muff at [email or web address] or [phone].   o Virtual Caregiver Retreat, Monday, February 21st, 3-5 pm o Powerful Tools for Caregivers, 6-wk series for caregivers, planning to be in person, starting March 10th o Well-Spring Navigator:  March 23 program, a free service to help individuals and families through the journey of determining care for older adults.  The "Navigator" is a [employer], Education officer, museum, who will speak with a prospective client and/or loved ones to provide an assessment of the situation and a set of recommendations for a personalized care plan - all free of charge, and whether [employer] [employer] offers the needed service or not. If the need is not a service we provide, we are well-connected with reputable programs in town that we can refer you to.  www.well-springsolutions.org or to speak with the Navigator, call [phone].

## 2020-11-20 ENCOUNTER — Encounter: Payer: Self-pay | Admitting: Physical Therapy

## 2020-11-20 ENCOUNTER — Ambulatory Visit: Payer: Medicare Other | Attending: Neurology | Admitting: Physical Therapy

## 2020-11-20 ENCOUNTER — Other Ambulatory Visit: Payer: Self-pay

## 2020-11-20 ENCOUNTER — Ambulatory Visit: Payer: Medicare Other | Admitting: Occupational Therapy

## 2020-11-20 DIAGNOSIS — R278 Other lack of coordination: Secondary | ICD-10-CM | POA: Insufficient documentation

## 2020-11-20 DIAGNOSIS — R251 Tremor, unspecified: Secondary | ICD-10-CM | POA: Diagnosis not present

## 2020-11-20 DIAGNOSIS — R2681 Unsteadiness on feet: Secondary | ICD-10-CM | POA: Diagnosis not present

## 2020-11-20 DIAGNOSIS — R293 Abnormal posture: Secondary | ICD-10-CM | POA: Insufficient documentation

## 2020-11-20 DIAGNOSIS — R29818 Other symptoms and signs involving the nervous system: Secondary | ICD-10-CM | POA: Diagnosis not present

## 2020-11-20 DIAGNOSIS — R2689 Other abnormalities of gait and mobility: Secondary | ICD-10-CM | POA: Insufficient documentation

## 2020-11-20 NOTE — Patient Instructions (Addendum)
PWR! Hands  With arms stretched out in front of you (elbows straight), perform the following:  PWR! Rock: Move wrists up and down [employer]! Twist: Twist palms up and down BIG  Then, start with elbows bent and hands closed.  PWR! Step: Touch index finger to thumb while keeping other fingers straight. Flick fingers out BIG (thumb out/straighten fingers). Repeat with other fingers. (Step your thumb to each finger).  PWR! Hands: Push hands out BIG. Elbows straight, wrists up, fingers open and spread apart BIG. (Can also perform by pushing down on table, chair, knees. Push above head, out to the side, behind you, in front of you.)  Make each movement big and deliberate so that you feel the movement.  Perform at least 10 repetitions 1x/day, but perform PWR! hands throughout the day when you are having trouble using your hands (picking up/manipulating small objects, writing, eating, typing, sewing, buttoning, etc.).     Coordination Exercises  Perform the following exercises for 20 minutes 1 times per day. Perform with both hand(s). Perform using big movements.   Flipping Cards: Place deck of cards on the table. Flip cards over by opening your hand big to grasp and then turn your palm up big.  Deal cards: Hold 1/2 or whole deck in your hand. Use thumb to push card off top of deck with one big push.  Rotate ball with fingertips: Pick up with fingers/thumb and move as much as you can with each turn/movement (clockwise and counter-clockwise).  Toss ball from one hand to the other: Toss big/high.  Toss ball in the air and catch with the same hand: Toss big/high.  Rotate 2 golf balls in your hand: Both directions.  Pick up coins and stack one at a time: Pick up with big, intentional movements. Do not drag coin to the edge. (5-10 in a stack)  Pick up 5-10 coins one at a time and hold in palm. Then, move coins from palm to fingertips one at time and place in coin bank/container.  Practice  writing: Slow down, write big, and focus on forming each letter.  Perform "Flicks"/hand stretches (PWR! Hands): Close hands then flick out your fingers with focus on opening hands, pulling wrists back, and extending elbows like you are pushing.

## 2020-11-20 NOTE — Patient Instructions (Addendum)
       PWR! Up and PWR! Rock only, 3-5 sec hold, 10-20 reps daily

## 2020-11-20 NOTE — Therapy (Signed)
[facility] [address] [place], [place], [address] Phone: [phone]   Fax:  [phone]  Physical Therapy Treatment  Patient Details  Name: PELLEGRINO [name] MRN: [id number] Date of Birth: [birth year] Referring Provider (PT): Wells Guiles, Tat DO   Encounter Date: 11/20/2020   PT End of Session - 11/20/20 0808    Visit Number 2    Number of Visits 9    Date for PT Re-Evaluation 01/07/21    Authorization Type Medicare/Aetna    PT Start Time 0805    PT Stop Time 0844    PT Time Calculation (min) 39 min    Activity Tolerance Patient tolerated treatment well    Behavior During Therapy [facility] for tasks assessed/performed           Past Medical History:  Diagnosis Date  . Cervical lymphadenopathy    right - followed my ENT  . Dystonia 1994   diagnosed in [place]  . Non Hodgkin's lymphoma ([place]) 2007   diffuse- 6 cycles of chemo with R CHOP Rituxan - last dose 10/08  . Parkinson disease ([place])    2015    Past Surgical History:  Procedure Laterality Date  . CHOLECYSTECTOMY, LAPAROSCOPIC    . EYE SURGERY     x2 as child  . PORT-A-CATH REMOVAL    . PORTACATH PLACEMENT    . SPLENECTOMY    . TONSILLECTOMY      There were no vitals filed for this visit.   Subjective Assessment - 11/20/20 0807    Subjective Saw Dr. Carles Collet and she added in another Sinemet pill in the evening, which makes the evening go better. No other changes.  Today is a "Parkie" day-just moving slower and things feel "off"    Pertinent History non-hodgkins lymphoma, PD (diagnosed in 2015), cervical lymphadenopathy    Patient Stated Goals No specific goals-main thing I want is for the class (PWR! Moves) to get going again.    Currently in Pain? No/denies                             [name] Adult PT Treatment/Exercise - 11/20/20 0001      Exercises   Exercises Other Exercises;Knee/Hip;Ankle    Other Exercises  Seated SciFit, Level  2-2.5x 6 minutes.  Pt rates intensity as 5/10.      Knee/Hip Exercises: Stretches   Active Hamstring Stretch Right;Left;3 reps    Active Hamstring Stretch Limitations foot propped on floor>then propped on 4" block.    Other Knee/Hip Stretches Active ankle dorsiflexion x 10 reps             [facility]) - 11/20/20 0827    PWR! exercises Moves in sitting;Moves in standing    PWR! Up x 10   visual cues of mirror for posture; 3 sec hold upon standing   PWR! Rock x 10 reps each side, cues for lengthening each side    PWR Step x 10 reps, FORWARD, with cues for deliberate step and return to midline for balance.    PWR! Sit to Stand x 10 reps, visual cues of mirror for posture, 3 sec hold in PWR! Up position; initial 5 reps for 5 sec hold in PWR! Up position    PWR! Up x 5 reps wtih 5 sec hold, then 10 reps with 3 sec hold.  Visual cues of mirror for feedback for posture    PWR! Rock x 5  reps 5 sec hold; x 10 reps 3 sec hold, cues for leg position and increased reach for trunk stretch               PT Education - 11/20/20 0903    Education Details initiated HEP-see instructions    Person(s) Educated Patient    Methods Explanation;Demonstration;Handout    Comprehension Verbalized understanding;Returned demonstration            PT Short Term Goals - 01/05/20 1225      PT SHORT TERM GOAL #1   Title ALL STGS = LTGS             PT Long Term Goals - 11/08/20 1457      PT LONG TERM GOAL #1   Title Pt will be independent with HEP and community fitness plans upon d/c from PT for continuing to maximize gains made in PT.  TARGET 12/07/2020    Time 4    Period Weeks    Status New      PT LONG TERM GOAL #2   Title Pt will improve gait velocity to at least 4 ft/sec for improved gait efficiency and safety in the community.    Time 4    Period Weeks    Status New      PT LONG TERM GOAL #3   Title Pt will perform posterior push and release test in 2 steps or less, for improved  posterior balance recovery.    Time 4    Period Weeks    Status New      PT LONG TERM GOAL #4   Title Pt will negotiate at least 4 steps, 4 reps, with appropriate foot clearance with ascending and descending stairs.    Time 4    Period Weeks    Status New                 Plan - 11/20/20 0904    Clinical Impression Statement Initiated HEP today, with focus on posture and trunk stretch/strengthening.  Utilized visual cues of mirror today, with pt self-correcting posture with use of visual cue.  Educated on intensity of movement patterns and focus on end-range hold for HEP to improve postural stability.    Personal Factors and Comorbidities Comorbidity 3+    Comorbidities non-hodgkins lymphoma, PD (diagnosed in 2015), cervical lymphadenopathy    Examination-Activity Limitations Stairs;Locomotion Level    Examination-Participation Restrictions Community Activity;Other   Exercise classes   Stability/Clinical Decision Making Evolving/Moderate complexity    Rehab Potential Good    PT Frequency 2x / week    PT Duration 4 weeks   plus eval   PT Treatment/Interventions ADLs/Self Care Home Management;Gait training;Stair training;Functional mobility training;Therapeutic activities;Patient/family education;Neuromuscular re-education;Balance training;Therapeutic exercise    PT Next Visit Plan Review HEP and update as appropriate.  (May likely inclue PWR! Moves sitting/standing) in conjunction with OT; sit<>stand, working on postural strengthening and awareness; gait training and overall focus on intensity/amplitude of movement patterns and posture; stepping strategies for balance recovery.  Consider resisted standing and gait    Consulted and Agree with Plan of Care Patient           Patient will benefit from skilled therapeutic intervention in order to improve the following deficits and impairments:  Abnormal gait,Decreased balance,Difficulty walking,Decreased strength,Postural  dysfunction  Visit Diagnosis: Abnormal posture  Unsteadiness on feet  Other abnormalities of gait and mobility     Problem List Patient Active Problem List   Diagnosis Date Noted  .  Healthcare maintenance 06/28/2020  . Gastroesophageal reflux disease 03/07/2020  . Localized swelling of right lower extremity 04/12/2019  . DOE (dyspnea on exertion) 07/15/2018  . 10 year risk of MI or stroke 7.5% or greater 04/12/2018  . 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 Dysphagia, neurologic 07/27/2014  . Parkinson disease ([place]) 07/27/2014  . Dystonia 07/10/2014  . Low back pain 11/21/2011  . TRANSAMINASES, SERUM, ELEVATED 07/06/2008  . DIARRHEA 05/23/2008  . LYMPH NODE-ENLARGED 07/28/2007  . Non-Hodgkin lymphoma ([place]) 03/23/2007    Lore Polka W. 11/20/2020, 9:14 AM  Frazier Butt., PT   [facility] [address] [place], [place], [address] Phone: [phone]   Fax:  [phone]  Name: [name] [name] MRN: [id number] Date of Birth: [birth year]

## 2020-11-20 NOTE — Therapy (Signed)
[facility] [address] [place], [place], [address] Phone: [phone]   Fax:  [phone]  Occupational Therapy Treatment  Patient Details  Name: [name] [name] MRN: [id number] Date of Birth: 01-23-1951 Referring Provider (OT): Dr. Wells Guiles Tat   Encounter Date: 11/20/2020   OT End of Session - 11/20/20 0926    Visit Number 2    Number of Visits 9    Date for OT Re-Evaluation 12/08/20    Authorization Type Medicare & Aetna--Covered at 100%    Authorization - Visit Number 2    Authorization - Number of Visits 10    Progress Note Due on Visit 10    OT Start Time 0848    OT Stop Time 0930    OT Time Calculation (min) 42 min    Activity Tolerance Patient tolerated treatment well    Behavior During Therapy [facility] for tasks assessed/performed           Past Medical History:  Diagnosis Date  . Cervical lymphadenopathy    right - followed my ENT  . Dystonia 1994   diagnosed in [place]  . Non Hodgkin's lymphoma ([place]) 2007   diffuse- 6 cycles of chemo with R CHOP Rituxan - last dose 10/08  . Parkinson disease ([place])    2015    Past Surgical History:  Procedure Laterality Date  . CHOLECYSTECTOMY, LAPAROSCOPIC    . EYE SURGERY     x2 as child  . PORT-A-CATH REMOVAL    . PORTACATH PLACEMENT    . SPLENECTOMY    . TONSILLECTOMY      There were no vitals filed for this visit.   Subjective Assessment - 11/20/20 1036    Subjective  Denies pain    Pertinent History Parkinson's Disease.   PMH:  non-hodgkin lymphoma with hx of chemo, dystonia, Meige syndrome    Patient Stated Goals improve handwriting    Currently in Pain? No/denies               Treatment: Pt practiced printing alphabet, then writing with pen with coban wrap. Pt demonstrates improved legibility and letter size with practice and min v.c for techniques. Pt requires v.c to slow down and pause between letters.                 OT  Education - 11/20/20 0916    Education Details PWR! basic 4 in quadraped, 10 reps each, min v.c , pt reports he has handout at home, therapist recommended pt uses seat of chair for sit to stand if needed at home, PWR! hands basic 4, then coordination HEP, see pt instructions.    Person(s) Educated Patient    Methods Explanation;Demonstration;Verbal cues;Handout    Comprehension Verbalized understanding;Returned demonstration;Verbal cues required            OT Short Term Goals - 11/08/20 1816      OT SHORT TERM GOAL #1   Title ---             OT Long Term Goals - 11/08/20 1803      OT LONG TERM GOAL #1   Title Pt will verbalize understanding of strategies to incr ease/efficiency with ADLs/IADLs (including use of tremor compensation strategies for eating dinner, writing, getting money from wallet, getting items from floor/getting up from floor).--check LTGs 12/05/20    Time 4    Period Weeks    Status New      OT LONG TERM GOAL #2  Title Pt will be independent with updated HEP    Time 4    Period Weeks    Status New      OT LONG TERM GOAL #3   Title Pt will be able to write at least 3 sentences with at least 90% legibility.    Baseline 75%    Time 4    Period Weeks    Status New      OT LONG TERM GOAL #4   Title Pt will demo/report incr ease with seperating money in wallet.    Time 4    Period Weeks    Status New      OT LONG TERM GOAL #5   Title Pt will improve LUE functional reaching as shown by improving score on box and blocks test by at least 4.    Baseline 39 blocks    Time 4    Period Weeks    Status New                 Plan - 11/20/20 1031    Clinical Impression Statement Pt is progressing towards goals. He demonstrates understanding of PWR! moves basic 4 in quadraped. Pt will benefit from continued reinforcement of big, controlled  movements with functional activity.    OT Occupational Profile and History Detailed Assessment- Review of Records  and additional review of physical, cognitive, psychosocial history related to current functional performance    Occupational performance deficits (Please refer to evaluation for details): ADL's;IADL's;Leisure    Body Structure / Function / Physical Skills ADL;Balance;Improper spinal/pelvic alignment;Mobility;UE functional use;FMC;Coordination;IADL;Dexterity;GMC   bradykinesia   Cognitive Skills Memory    Rehab Potential Good    Clinical Decision Making Several treatment options, min-mod task modification necessary    Comorbidities Affecting Occupational Performance: May have comorbidities impacting occupational performance    Modification or Assistance to Complete Evaluation  No modification of tasks or assist necessary to complete eval    OT Frequency 2x / week    OT Duration 4 weeks   +eval (or 9 total visits as may need to modify due to schedule conflicts)   OT Treatment/Interventions Self-care/ADL training;Therapeutic exercise;Functional Mobility Training;Balance training;Manual Therapy;Neuromuscular education;Ultrasound;Aquatic Therapy;Energy conservation;Therapeutic activities;Cryotherapy;DME and/or AE instruction;Cognitive remediation/compensation;Passive range of motion;Patient/family education;Moist Heat    Plan work towards short term goals,big movements with ADLS,  continue handwriting strategies,    Consulted and Agree with Plan of Care Patient           Patient will benefit from skilled therapeutic intervention in order to improve the following deficits and impairments:   Body Structure / Function / Physical Skills: ADL,Balance,Improper spinal/pelvic alignment,Mobility,UE functional use,FMC,Coordination,IADL,Dexterity,GMC (bradykinesia) Cognitive Skills: Memory     Visit Diagnosis: Other symptoms and signs involving the nervous system  Other lack of coordination  Tremor  Abnormal posture    Problem List Patient Active Problem List   Diagnosis Date Noted  .  Healthcare maintenance 06/28/2020  . Gastroesophageal reflux disease 03/07/2020  . Localized swelling of right lower extremity 04/12/2019  . DOE (dyspnea on exertion) 07/15/2018  . 10 year risk of MI or stroke 7.5% or greater 04/12/2018  . 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 Dysphagia, neurologic 07/27/2014  . Parkinson disease ([place]) 07/27/2014  . Dystonia 07/10/2014  . Low back pain 11/21/2011  . TRANSAMINASES, SERUM, ELEVATED 07/06/2008  . DIARRHEA 05/23/2008  . LYMPH NODE-ENLARGED 07/28/2007  . Non-Hodgkin lymphoma ([place]) 03/23/2007    RINE,KATHRYN 11/20/2020, 10:38 AM  Theone Murdoch, OTR/L [phone] Phone: [phone] 10:41 AM 11/20/20 [facility] [address] [place], [place], [address] Phone: [phone]   Fax:  [phone]  Name: [name] [name] MRN: [id number] Date of Birth: Dec 13, 1950

## 2020-11-23 ENCOUNTER — Encounter: Payer: Self-pay | Admitting: Occupational Therapy

## 2020-11-23 ENCOUNTER — Encounter: Payer: Self-pay | Admitting: Physical Therapy

## 2020-11-23 ENCOUNTER — Ambulatory Visit: Payer: Medicare Other | Admitting: Physical Therapy

## 2020-11-23 ENCOUNTER — Ambulatory Visit: Payer: Medicare Other | Admitting: Occupational Therapy

## 2020-11-23 ENCOUNTER — Other Ambulatory Visit: Payer: Self-pay

## 2020-11-23 DIAGNOSIS — R278 Other lack of coordination: Secondary | ICD-10-CM

## 2020-11-23 DIAGNOSIS — R2681 Unsteadiness on feet: Secondary | ICD-10-CM

## 2020-11-23 DIAGNOSIS — R2689 Other abnormalities of gait and mobility: Secondary | ICD-10-CM

## 2020-11-23 DIAGNOSIS — R251 Tremor, unspecified: Secondary | ICD-10-CM | POA: Diagnosis not present

## 2020-11-23 DIAGNOSIS — R293 Abnormal posture: Secondary | ICD-10-CM | POA: Diagnosis not present

## 2020-11-23 DIAGNOSIS — R29818 Other symptoms and signs involving the nervous system: Secondary | ICD-10-CM | POA: Diagnosis not present

## 2020-11-23 NOTE — Patient Instructions (Signed)
Educated to perform all 4 PWR! Moves in standing   Provided handout for PWR! Step forward x 10, PWR! Step back x 10

## 2020-11-23 NOTE — Therapy (Signed)
[facility] [address] [place], [place], [address] Phone: [phone]   Fax:  [phone]  Physical Therapy Treatment  Patient Details  Name: [name] [name] MRN: [id number] Date of Birth: [birth year] Referring Provider (PT): Wells Guiles, Tat DO   Encounter Date: 11/23/2020   PT End of Session - 11/23/20 0847    Visit Number 3    Number of Visits 9    Date for PT Re-Evaluation 01/07/21    Authorization Type Medicare/Aetna    PT Start Time 0847    PT Stop Time 0929    PT Time Calculation (min) 42 min    Activity Tolerance Patient tolerated treatment well    Behavior During Therapy [facility] for tasks assessed/performed           Past Medical History:  Diagnosis Date  . Cervical lymphadenopathy    right - followed my ENT  . Dystonia 1994   diagnosed in [place]  . Non Hodgkin's lymphoma ([place]) 2007   diffuse- 6 cycles of chemo with R CHOP Rituxan - last dose 10/08  . Parkinson disease ([place])    2015    Past Surgical History:  Procedure Laterality Date  . CHOLECYSTECTOMY, LAPAROSCOPIC    . EYE SURGERY     x2 as child  . PORT-A-CATH REMOVAL    . PORTACATH PLACEMENT    . SPLENECTOMY    . TONSILLECTOMY      There were no vitals filed for this visit.   Subjective Assessment - 11/23/20 0848    Subjective No changes, nothing new.    Pertinent History non-hodgkins lymphoma, PD (diagnosed in 2015), cervical lymphadenopathy    Patient Stated Goals No specific goals-main thing I want is for the class (PWR! Moves) to get going again.    Currently in Pain? No/denies                             [facility] Adult PT Treatment/Exercise - 11/23/20 0001      Ambulation/Gait   Ambulation/Gait Yes    Ambulation/Gait Assistance 6: Modified independent (Device/Increase time)    Ambulation/Gait Assistance Details Cues for upright posture    Ambulation Distance (Feet) 400 Feet    Assistive device None    Gait  Pattern Step-through pattern;Decreased trunk rotation;Trunk flexed    Ambulation Surface Level;Indoor      Neuro Re-ed    Neuro Re-ed Details  Resisted standing exercise, with PT providing balance perturbations through resistance in posterior, post-lateral, and anterior directions.  Progressed to forward resisted walking x 20 ft, 3 reps, transition to backwards walking no resistance.  Then with PT stationary with resistance, pt ambulates forward 3-5 steps to end of resistance, powers up his posture, then returns backwards to start position x at least 5 reps.  Cues for control with transitions.  Resisted sidestepping along counter R and L x 20 ft, then with stationary resistance, pt performs 2-3 steps to end of resistance, then control return to start.  Cues throughout for PWR! Up position briefly with transitional movements and larger step length/best posture.             Reviewed HEP from last visit: PWR Up in sitting x 10 reps, in standing x 10 reps, with 3-5 sec hold Sit to stand PWR! Up x 10 reps PWR! Rock in sitting x 5 reps, then standing x 10 reps 3-5 sec hold  Also performed standing PWR! Twist x  10 reps PWR! Step (side) in standing x 10 reps  Forward step and weightshift x 10 in standing -cues for deliberate step and stop, step and return to midline Back step and weigthshift x 10 reps in standing-cues for deliberate step and upright posture upon return to midline       PT Education - 11/23/20 1206    Education Details Additions to [employer]) Educated Patient    Methods Explanation;Demonstration;Handout    Comprehension Verbalized understanding;Returned demonstration            PT Short Term Goals - 01/05/20 1225      PT SHORT TERM GOAL #1   Title ALL STGS = LTGS             PT Long Term Goals - 11/08/20 1457      PT LONG TERM GOAL #1   Title Pt will be independent with HEP and community fitness plans upon d/c from PT for continuing to maximize gains  made in PT.  TARGET 12/07/2020    Time 4    Period Weeks    Status New      PT LONG TERM GOAL #2   Title Pt will improve gait velocity to at least 4 ft/sec for improved gait efficiency and safety in the community.    Time 4    Period Weeks    Status New      PT LONG TERM GOAL #3   Title Pt will perform posterior push and release test in 2 steps or less, for improved posterior balance recovery.    Time 4    Period Weeks    Status New      PT LONG TERM GOAL #4   Title Pt will negotiate at least 4 steps, 4 reps, with appropriate foot clearance with ascending and descending stairs.    Time 4    Period Weeks    Status New                 Plan - 11/23/20 1201    Clinical Impression Statement Reviewed HEP, with pt return demo understanding.  Pt overall appears to have improved awareness of postural correction, no visual cues needed.  Pt continues to rate exercises at about 7/10 intensity.  He will continue to benefit from further skilled PT to address posture, balance, functional mobility.    Personal Factors and Comorbidities Comorbidity 3+    Comorbidities non-hodgkins lymphoma, PD (diagnosed in 2015), cervical lymphadenopathy    Examination-Activity Limitations Stairs;Locomotion Level    Examination-Participation Restrictions Community Activity;Other   Exercise classes   Stability/Clinical Decision Making Evolving/Moderate complexity    Rehab Potential Good    PT Frequency 2x / week    PT Duration 4 weeks   plus eval   PT Treatment/Interventions ADLs/Self Care Home Management;Gait training;Stair training;Functional mobility training;Therapeutic activities;Patient/family education;Neuromuscular re-education;Balance training;Therapeutic exercise    PT Next Visit Plan Update HEP as appropriate; sit<>stand, working on postural strengthening and awareness; gait training and overall focus on intensity/amplitude of movement patterns and posture; stepping strategies for balance  recovery. Resisted standing and gait (try attaching to machine for stationary resistance)    Consulted and Agree with Plan of Care Patient           Patient will benefit from skilled therapeutic intervention in order to improve the following deficits and impairments:  Abnormal gait,Decreased balance,Difficulty walking,Decreased strength,Postural dysfunction  Visit Diagnosis: Abnormal posture  Unsteadiness on feet  Other abnormalities of gait  and mobility     Problem List Patient Active Problem List   Diagnosis Date Noted  . Healthcare maintenance 06/28/2020  . Gastroesophageal reflux disease 03/07/2020  . Localized swelling of right lower extremity 04/12/2019  . DOE (dyspnea on exertion) 07/15/2018  . 10 year risk of MI or stroke 7.5% or greater 04/12/2018  . 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 Dysphagia, neurologic 07/27/2014  . Parkinson disease ([place]) 07/27/2014  . Dystonia 07/10/2014  . Low back pain 11/21/2011  . TRANSAMINASES, SERUM, ELEVATED 07/06/2008  . DIARRHEA 05/23/2008  . LYMPH NODE-ENLARGED 07/28/2007  . Non-Hodgkin lymphoma ([place]) 03/23/2007    Juliah Scadden W. 11/23/2020, 12:06 PM Frazier Butt., PT [facility] [address] [place], [place], [address] Phone: [phone]   Fax:  [phone]  Name: [name] [name] MRN: [id number] Date of Birth: 1950-09-23

## 2020-11-23 NOTE — Therapy (Signed)
[facility] [address] [place], [place], [address] Phone: [phone]   Fax:  [phone]  Occupational Therapy Treatment  Patient Details  Name: [name] [name] MRN: [id number] Date of Birth: 1951/03/02 Referring Provider (OT): Dr. Wells Guiles Tat   Encounter Date: 11/23/2020   OT End of Session - 11/23/20 0833    Visit Number 3    Number of Visits 9    Date for OT Re-Evaluation 12/08/20    Authorization Type Medicare & Aetna--Covered at 100%    Authorization - Visit Number 3    Authorization - Number of Visits 10    Progress Note Due on Visit 10    OT Start Time 0803    OT Stop Time 0843    OT Time Calculation (min) 40 min    Activity Tolerance Patient tolerated treatment well    Behavior During Therapy [facility] for tasks assessed/performed           Past Medical History:  Diagnosis Date  . Cervical lymphadenopathy    right - followed my ENT  . Dystonia 1994   diagnosed in [place]  . Non Hodgkin's lymphoma ([place]) 2007   diffuse- 6 cycles of chemo with R CHOP Rituxan - last dose 10/08  . Parkinson disease ([place])    2015    Past Surgical History:  Procedure Laterality Date  . CHOLECYSTECTOMY, LAPAROSCOPIC    . EYE SURGERY     x2 as child  . PORT-A-CATH REMOVAL    . PORTACATH PLACEMENT    . SPLENECTOMY    . TONSILLECTOMY      There were no vitals filed for this visit.   Subjective Assessment - 11/23/20 0823    Subjective  Denies pain    Pertinent History Parkinson's Disease.   PMH:  non-hodgkin lymphoma with hx of chemo, dystonia, Meige syndrome    Patient Stated Goals improve handwriting    Currently in Pain? No/denies                Treatment: Handwriting activity with pt printing upper and lowercase alphabet, followed by printing several sentences, pt was cued to pause between each letter and word, and to write in capital letters. He demonstrates improved legibility and letter size with this  strategy.                OT Education - 11/23/20 0910    Education Details PWR! seated flow, 10 reps each, min v.c for larger aplitude movements, handwriting strategies including pausing in between words and letters, PWR! hands basic 4 x 10 reps, reveiwed stcking coins, rotating ball and rotating golf balls from HEP, min v.c for techniues.    Person(s) Educated Patient    Methods Explanation;Demonstration;Verbal cues    Comprehension Verbalized understanding;Returned demonstration;Verbal cues required            OT Short Term Goals - 11/08/20 1816      OT SHORT TERM GOAL #1   Title ---             OT Long Term Goals - 11/08/20 1803      OT LONG TERM GOAL #1   Title Pt will verbalize understanding of strategies to incr ease/efficiency with ADLs/IADLs (including use of tremor compensation strategies for eating dinner, writing, getting money from wallet, getting items from floor/getting up from floor).--check LTGs 12/05/20    Time 4    Period Weeks    Status New      OT  LONG TERM GOAL #2   Title Pt will be independent with updated HEP    Time 4    Period Weeks    Status New      OT LONG TERM GOAL #3   Title Pt will be able to write at least 3 sentences with at least 90% legibility.    Baseline 75%    Time 4    Period Weeks    Status New      OT LONG TERM GOAL #4   Title Pt will demo/report incr ease with seperating money in wallet.    Time 4    Period Weeks    Status New      OT LONG TERM GOAL #5   Title Pt will improve LUE functional reaching as shown by improving score on box and blocks test by at least 4.    Baseline 39 blocks    Time 4    Period Weeks    Status New                 Plan - 11/23/20 1610    Clinical Impression Statement Pt is progressing towards goals. He demonstrates imrpoved ability to retrieve items from his wallet today. Pt continues to demonstrate difficulty with handwriting. He requires v.c to slow down and write  bigger.    OT Occupational Profile and History Detailed Assessment- Review of Records and additional review of physical, cognitive, psychosocial history related to current functional performance    Occupational performance deficits (Please refer to evaluation for details): ADL's;IADL's;Leisure    OT Frequency 2x / week    OT Duration 4 weeks    OT Treatment/Interventions Self-care/ADL training;Therapeutic exercise;Functional Mobility Training;Balance training;Manual Therapy;Neuromuscular education;Ultrasound;Aquatic Therapy;Energy conservation;Therapeutic activities;Cryotherapy;DME and/or AE instruction;Cognitive remediation/compensation;Passive range of motion;Patient/family education;Moist Heat    Plan big movements with ADLS,  continue handwriting strategies, cue pt to write super slow and he demonstrates better legibility and letter size    Consulted and Agree with Plan of Care Patient           Patient will benefit from skilled therapeutic intervention in order to improve the following deficits and impairments:           Visit Diagnosis: Other symptoms and signs involving the nervous system  Other lack of coordination  Tremor  Abnormal posture    Problem List Patient Active Problem List   Diagnosis Date Noted  . Healthcare maintenance 06/28/2020  . Gastroesophageal reflux disease 03/07/2020  . Localized swelling of right lower extremity 04/12/2019  . DOE (dyspnea on exertion) 07/15/2018  . 10 year risk of MI or stroke 7.5% or greater 04/12/2018  . 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 Dysphagia, neurologic 07/27/2014  . Parkinson disease ([place]) 07/27/2014  . Dystonia 07/10/2014  . Low back pain 11/21/2011  . TRANSAMINASES, SERUM, ELEVATED 07/06/2008  . DIARRHEA 05/23/2008  . LYMPH NODE-ENLARGED 07/28/2007  . Non-Hodgkin lymphoma ([place]) 03/23/2007    Aliyyah Riese 11/23/2020, 9:13 AM  [facility] [address] [place], [place], [address] Phone: [phone]   Fax:  [phone]  Name: [name] [name] MRN: [id number] Date of Birth: [birth year]

## 2020-11-27 ENCOUNTER — Other Ambulatory Visit: Payer: Self-pay

## 2020-11-27 ENCOUNTER — Ambulatory Visit: Payer: Medicare Other | Admitting: Physical Therapy

## 2020-11-27 ENCOUNTER — Ambulatory Visit: Payer: Medicare Other | Admitting: Occupational Therapy

## 2020-11-27 ENCOUNTER — Encounter: Payer: Self-pay | Admitting: Physical Therapy

## 2020-11-27 DIAGNOSIS — R293 Abnormal posture: Secondary | ICD-10-CM

## 2020-11-27 DIAGNOSIS — R251 Tremor, unspecified: Secondary | ICD-10-CM | POA: Diagnosis not present

## 2020-11-27 DIAGNOSIS — R2681 Unsteadiness on feet: Secondary | ICD-10-CM

## 2020-11-27 DIAGNOSIS — R278 Other lack of coordination: Secondary | ICD-10-CM

## 2020-11-27 DIAGNOSIS — R2689 Other abnormalities of gait and mobility: Secondary | ICD-10-CM

## 2020-11-27 DIAGNOSIS — R29818 Other symptoms and signs involving the nervous system: Secondary | ICD-10-CM | POA: Diagnosis not present

## 2020-11-27 NOTE — Therapy (Signed)
[facility] [address] [place], [place], [address] Phone: [phone]   Fax:  [phone]  Physical Therapy Treatment  Patient Details  Name: [name] [name] MRN: [id number] Date of Birth: 1951/06/07 Referring Provider (PT): Wells Guiles, Tat DO   Encounter Date: 11/27/2020   PT End of Session - 11/27/20 0849    Visit Number 4    Number of Visits 9    Date for PT Re-Evaluation 01/07/21    Authorization Type Medicare/Aetna    PT Start Time 0850    PT Stop Time 0931    PT Time Calculation (min) 41 min    Activity Tolerance Patient tolerated treatment well    Behavior During Therapy [facility] for tasks assessed/performed           Past Medical History:  Diagnosis Date  . Cervical lymphadenopathy    right - followed my ENT  . Dystonia 1994   diagnosed in [place]  . Non Hodgkin's lymphoma ([place]) 2007   diffuse- 6 cycles of chemo with R CHOP Rituxan - last dose 10/08  . Parkinson disease ([place])    2015    Past Surgical History:  Procedure Laterality Date  . CHOLECYSTECTOMY, LAPAROSCOPIC    . EYE SURGERY     x2 as child  . PORT-A-CATH REMOVAL    . PORTACATH PLACEMENT    . SPLENECTOMY    . TONSILLECTOMY      There were no vitals filed for this visit.   Subjective Assessment - 11/27/20 0852    Subjective No changes.  Did my exercises since last visit.    Pertinent History non-hodgkins lymphoma, PD (diagnosed in 2015), cervical lymphadenopathy    Patient Stated Goals No specific goals-main thing I want is for the class (PWR! Moves) to get going again.    Currently in Pain? No/denies                       Reviewed HEP given last visit, PWR! Moves in Standing:  PWR! Up for posture x 10 reps  PWR! Rock for weigthshifting x 10 reps; 2nd set of 10 reps with increased hold time for SLS with reach  PWR! Twist for trunk rotation x 10 reps  PWR! Step for step initiation/balance recovery, 10 reps to side,  10 reps forward, 10 reps back, with cues for posture in midline.  Cues give for improved step height and foot clearance.  Pt rates intensity of ex as 4-5/10  PWR! Moves Flow in standing x 5 reps  PWR! Up>PWR! Rock>PWR! Twist>PWR! Step  Standing on [employer] at counter:  Pt performs PWR! Moves in Standing: PWR! Up x 5 PWR! Rock x 5 reps each side PWR! Twist x 5 reps each side PWR! Step x 5 reps each side:  forwaard step and weightshift/return to midline x 10 reps, side step and weightshift and return to midline x 10 reps, back step and weigthshift, then return to midline x 10 reps         [facility] Adult PT Treatment/Exercise - 11/27/20 0001      Ambulation/Gait   Ambulation/Gait Yes    Ambulation/Gait Assistance 6: Modified independent (Device/Increase time)    Ambulation/Gait Assistance Details Resisted walking, 3 laps in gym, then no resistance, with increased arm swing, posture, step length and overall intensity of gait, 2 laps    Ambulation Distance (Feet) 345 Feet   230   Assistive device None    Gait Pattern Step-through  pattern;Decreased trunk rotation;Trunk flexed    Ambulation Surface Level;Indoor               Balance Exercises - 11/27/20 0001      Balance Exercises: Standing   Other Standing Exercises Standing on [employer]:  forward>back step and weightshift x 10 reps each leg, with cues for foot clearance, step length.    Other Standing Exercises Comments Alternating step over low hurdle, then return to midline, x 10 reps forward direction, then 10 reps each side.  Multidirectional stepping over hurdles clockwise and counterclockwise directions.               PT Short Term Goals - 01/05/20 1225      PT SHORT TERM GOAL #1   Title ALL STGS = LTGS             PT Long Term Goals - 11/08/20 1457      PT LONG TERM GOAL #1   Title Pt will be independent with HEP and community fitness plans upon d/c from PT for continuing to maximize gains made in PT.  TARGET  12/07/2020    Time 4    Period Weeks    Status New      PT LONG TERM GOAL #2   Title Pt will improve gait velocity to at least 4 ft/sec for improved gait efficiency and safety in the community.    Time 4    Period Weeks    Status New      PT LONG TERM GOAL #3   Title Pt will perform posterior push and release test in 2 steps or less, for improved posterior balance recovery.    Time 4    Period Weeks    Status New      PT LONG TERM GOAL #4   Title Pt will negotiate at least 4 steps, 4 reps, with appropriate foot clearance with ascending and descending stairs.    Time 4    Period Weeks    Status New                 Plan - 11/27/20 2201    Clinical Impression Statement Reviewed HEP this visit, with pt return demo understanding.  Pt continues to demo improved postural awareness in session today.  Progressed step strategy exercises to stnading on foam, to stepping over hurdles, and for multi-directional stepping, to improve intensity of movement patterns.  He will conitnue to benefit from skilled PT to further address balance, posture, and gait for improved overall functional mobility.    Personal Factors and Comorbidities Comorbidity 3+    Comorbidities non-hodgkins lymphoma, PD (diagnosed in 2015), cervical lymphadenopathy    Examination-Activity Limitations Stairs;Locomotion Level    Examination-Participation Restrictions Community Activity;Other   Exercise classes   Stability/Clinical Decision Making Evolving/Moderate complexity    Rehab Potential Good    PT Frequency 2x / week    PT Duration 4 weeks   plus eval   PT Treatment/Interventions ADLs/Self Care Home Management;Gait training;Stair training;Functional mobility training;Therapeutic activities;Patient/family education;Neuromuscular re-education;Balance training;Therapeutic exercise    PT Next Visit Plan Update HEP as appropriate; sit<>stand, working on postural strengthening and awareness; gait training and overall  focus on intensity/amplitude of movement patterns and posture; stepping strategies for balance recovery. Resisted standing and gait (try attaching to machine for stationary resistance)    Consulted and Agree with Plan of Care Patient           Patient will benefit from skilled therapeutic intervention  in order to improve the following deficits and impairments:  Abnormal gait,Decreased balance,Difficulty walking,Decreased strength,Postural dysfunction  Visit Diagnosis: Unsteadiness on feet  Abnormal posture  Other abnormalities of gait and mobility     Problem List Patient Active Problem List   Diagnosis Date Noted  . Healthcare maintenance 06/28/2020  . Gastroesophageal reflux disease 03/07/2020  . Localized swelling of right lower extremity 04/12/2019  . DOE (dyspnea on exertion) 07/15/2018  . 10 year risk of MI or stroke 7.5% or greater 04/12/2018  . 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 Dysphagia, neurologic 07/27/2014  . Parkinson disease ([place]) 07/27/2014  . Dystonia 07/10/2014  . Low back pain 11/21/2011  . TRANSAMINASES, SERUM, ELEVATED 07/06/2008  . DIARRHEA 05/23/2008  . LYMPH NODE-ENLARGED 07/28/2007  . Non-Hodgkin lymphoma ([place]) 03/23/2007    Khalila Buechner W. 11/27/2020, 10:04 PM  Frazier Butt., PT   [facility] [address] [place], [place], [address] Phone: [phone]   Fax:  [phone]  Name: [name] [name] MRN: [id number] Date of Birth: [birth year]

## 2020-11-27 NOTE — Therapy (Signed)
[facility] [address] [place], [place], [address] Phone: [phone]   Fax:  [phone]  Occupational Therapy Treatment  Patient Details  Name: [name] [name] MRN: [id number] Date of Birth: 1950/10/11 Referring Provider (OT): Dr. Wells Guiles Tat   Encounter Date: 11/27/2020   OT End of Session - 11/27/20 0816    Visit Number 4    Number of Visits 9    Date for OT Re-Evaluation 12/08/20    Authorization Type Medicare & Aetna--Covered at 100%    Authorization - Visit Number 4    Authorization - Number of Visits 10    Progress Note Due on Visit 10    OT Start Time 0806    OT Stop Time 0845    OT Time Calculation (min) 39 min    Activity Tolerance Patient tolerated treatment well    Behavior During Therapy [facility] for tasks assessed/performed           Past Medical History:  Diagnosis Date   Cervical lymphadenopathy    right - followed my ENT   Dystonia 1994   diagnosed in [place]   Non Hodgkin's lymphoma ([place]) 2007   diffuse- 6 cycles of chemo with R CHOP Rituxan - last dose 10/08   Parkinson disease ([place])    2015    Past Surgical History:  Procedure Laterality Date   CHOLECYSTECTOMY, LAPAROSCOPIC     EYE SURGERY     x2 as child   PORT-A-CATH REMOVAL     PORTACATH PLACEMENT     SPLENECTOMY     TONSILLECTOMY      There were no vitals filed for this visit.   Subjective Assessment - 11/27/20 0807    Pertinent History Parkinson's Disease.   PMH:  non-hodgkin lymphoma with hx of chemo, dystonia, Meige syndrome    Patient Stated Goals improve handwriting    Currently in Pain? No/denies           PWR! Hands (basic 4) x10 each with min cueing for large amplitude and speed/timing.  Sliding cards across table using PWR! Hands with each hand with min cueing for large amplitude/timing (full ROM).  Noted L 4th digit PIP subluxation and "catching" with finger ext/flex.  Therefore, fitted pt for  oval 8 for this finger (size 9) which helped.  Pt instructed in wear (during the day), donning instructions.    Practiced writing:   Pt able to write 3 (self generated sentences--cued pt to think prior to writing)  sentences using "the pencil" grip and min v.c. to slow down on lined paper.  Pt demo improved positioning with pencil grip and reported incr ease.  Good legibility and size.  (After 2 sentences min cueing to restart big when size became smaller).  Then practiced writing on graph paper to help with spacing, size.  Pt used all caps and demo good size, legibility to write a sentence.  Then practiced "o" to fill up blocks.  Recommended pt use graph paper (and issued) to write notes with at home.  Also issued "the pencil grip" for home.          OT Short Term Goals - 11/08/20 1816      OT SHORT TERM GOAL #1   Title ---             OT Long Term Goals - 11/08/20 1803      OT LONG TERM GOAL #1   Title Pt will verbalize understanding of strategies to incr  ease/efficiency with ADLs/IADLs (including use of tremor compensation strategies for eating dinner, writing, getting money from wallet, getting items from floor/getting up from floor).--check LTGs 12/05/20    Time 4    Period Weeks    Status New      OT LONG TERM GOAL #2   Title Pt will be independent with updated HEP    Time 4    Period Weeks    Status New      OT LONG TERM GOAL #3   Title Pt will be able to write at least 3 sentences with at least 90% legibility.    Baseline 75%    Time 4    Period Weeks    Status New      OT LONG TERM GOAL #4   Title Pt will demo/report incr ease with seperating money in wallet.    Time 4    Period Weeks    Status New      OT LONG TERM GOAL #5   Title Pt will improve LUE functional reaching as shown by improving score on box and blocks test by at least 4.    Baseline 39 blocks    Time 4    Period Weeks    Status New                 Plan - 11/27/20 0998    Clinical  Impression Statement Pt is progressing towards goals.  Pt demo improved legibility and size with writing with strategies.    OT Occupational Profile and History Detailed Assessment- Review of Records and additional review of physical, cognitive, psychosocial history related to current functional performance    Occupational performance deficits (Please refer to evaluation for details): ADL's;IADL's;Leisure    OT Frequency 2x / week    OT Duration 4 weeks    OT Treatment/Interventions Self-care/ADL training;Therapeutic exercise;Functional Mobility Training;Balance training;Manual Therapy;Neuromuscular education;Ultrasound;Aquatic Therapy;Energy conservation;Therapeutic activities;Cryotherapy;DME and/or AE instruction;Cognitive remediation/compensation;Passive range of motion;Patient/family education;Moist Heat    Plan big movements with ADLS,  continue handwriting strategies, cue pt to write super slow and he demonstrates better legibility and letter size (also used graph paper and the pencil grip)    Consulted and Agree with Plan of Care Patient           Patient will benefit from skilled therapeutic intervention in order to improve the following deficits and impairments:           Visit Diagnosis: Other symptoms and signs involving the nervous system  Other lack of coordination  Tremor  Abnormal posture  Unsteadiness on feet  Other abnormalities of gait and mobility    Problem List Patient Active Problem List   Diagnosis Date Noted   Healthcare maintenance 06/28/2020   Gastroesophageal reflux disease 03/07/2020   Localized swelling of right lower extremity 04/12/2019   DOE (dyspnea on exertion) 07/15/2018   10 year risk of MI or stroke 7.5% or greater 04/12/2018   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 [place], [place]  [address] [phone] phone [phone] 11/27/20 9:23 AM

## 2020-11-30 ENCOUNTER — Ambulatory Visit: Payer: Medicare Other | Admitting: Physical Therapy

## 2020-11-30 ENCOUNTER — Encounter: Payer: Self-pay | Admitting: Occupational Therapy

## 2020-11-30 ENCOUNTER — Encounter: Payer: Self-pay | Admitting: Physical Therapy

## 2020-11-30 ENCOUNTER — Ambulatory Visit: Payer: Medicare Other | Admitting: Occupational Therapy

## 2020-11-30 ENCOUNTER — Other Ambulatory Visit: Payer: Self-pay

## 2020-11-30 DIAGNOSIS — R293 Abnormal posture: Secondary | ICD-10-CM

## 2020-11-30 DIAGNOSIS — R251 Tremor, unspecified: Secondary | ICD-10-CM

## 2020-11-30 DIAGNOSIS — R29818 Other symptoms and signs involving the nervous system: Secondary | ICD-10-CM

## 2020-11-30 DIAGNOSIS — R2681 Unsteadiness on feet: Secondary | ICD-10-CM

## 2020-11-30 DIAGNOSIS — R278 Other lack of coordination: Secondary | ICD-10-CM

## 2020-11-30 DIAGNOSIS — R2689 Other abnormalities of gait and mobility: Secondary | ICD-10-CM | POA: Diagnosis not present

## 2020-11-30 NOTE — Therapy (Signed)
[facility] [address] [place], [place], [address] Phone: [phone]   Fax:  [phone]  Physical Therapy Treatment  Patient Details  Name: [name] [name] MRN: [id number] Date of Birth: 01/17/1951 Referring Provider (PT): Wells Guiles, Tat DO   Encounter Date: 11/30/2020   PT End of Session - 11/30/20 1142    Visit Number 5    Number of Visits 9    Date for PT Re-Evaluation 01/07/21    Authorization Type Medicare/Aetna    PT Start Time 1100    PT Stop Time 1140    PT Time Calculation (min) 40 min    Activity Tolerance Patient tolerated treatment well    Behavior During Therapy [facility] for tasks assessed/performed           Past Medical History:  Diagnosis Date  . Cervical lymphadenopathy    right - followed my ENT  . Dystonia 1994   diagnosed in [place]  . Non Hodgkin's lymphoma ([place]) 2007   diffuse- 6 cycles of chemo with R CHOP Rituxan - last dose 10/08  . Parkinson disease ([place])    2015    Past Surgical History:  Procedure Laterality Date  . CHOLECYSTECTOMY, LAPAROSCOPIC    . EYE SURGERY     x2 as child  . PORT-A-CATH REMOVAL    . PORTACATH PLACEMENT    . SPLENECTOMY    . TONSILLECTOMY      There were no vitals filed for this visit.   Subjective Assessment - 11/30/20 1102    Subjective No changes since he was last here. Doing the exercises, but not everyday.    Pertinent History non-hodgkins lymphoma, PD (diagnosed in 2015), cervical lymphadenopathy    Patient Stated Goals No specific goals-main thing I want is for the class (PWR! Moves) to get going again.    Currently in Pain? No/denies                       NMR:  Standing PWR flow x7 reps:  PWR! Up>PWR! Rock>PWR! Twist>PWR! Step -cues intermittently for incr step height/foot clearance.   Seated PWR Up >  stand > Standing PWR Up > sit  x6 reps on blue air ex, needs intermittent cues for sequencing and holding for a couple  of seconds for incr postural awareness.    With 4" black foam beam:  Alternating lateral stepping with opposite arm raise - pt with initial difficulty sequencing, improved with incr reps x7 reps B, then adding in cognitive challenge naming animals in [place] x7 reps B, with pt slowing down movements and needing intermittent reminder cues on naming.   Stepping in a  clockwise direction - all directions (forward, lateral, posterior)  with arms extended outward (in forward/lateral direction), and arms forward with stepping posteriorly x5 reps with intermittent cues for sequencing and step height, then performed same activity with 2" floor beams in forward/lateral diections for incr step height x5 reps.    Resisted stepping with belt attached to machine: lateral stepping directions each way x3 reps (approx 25') - cues to PWR Up before stepping and before slowly stepping back to midline -pt initially with smaller steps and almost losing balance stepping back to machine, but improved with 2nd and 3rd reps, min guard for balance, initial cues for incr step height, posterior stepping x3 reps away from machine - initial cues for incr step length, and then forward walking with arm swing back to machine  PT Short Term Goals - 01/05/20 1225      PT SHORT TERM GOAL #1   Title ALL STGS = LTGS             PT Long Term Goals - 11/08/20 1457      PT LONG TERM GOAL #1   Title Pt will be independent with HEP and community fitness plans upon d/c from PT for continuing to maximize gains made in PT.  TARGET 12/07/2020    Time 4    Period Weeks    Status New      PT LONG TERM GOAL #2   Title Pt will improve gait velocity to at least 4 ft/sec for improved gait efficiency and safety in the community.    Time 4    Period Weeks    Status New      PT LONG TERM GOAL #3   Title Pt will perform posterior push and release test in 2 steps or less, for improved posterior balance recovery.     Time 4    Period Weeks    Status New      PT LONG TERM GOAL #4   Title Pt will negotiate at least 4 steps, 4 reps, with appropriate foot clearance with ascending and descending stairs.    Time 4    Period Weeks    Status New                 Plan - 11/30/20 1159    Clinical Impression Statement Today's skilled session focused on stepping strategies, postural awareness, and resisted gait activities in the lateral/posterior directions. Pt able to demonstrate larger movement patterns and incr step height with use of stepping over obstacles. Does have initial incr difficulty when adding in coordination with UE movement and dual tasking, but improves with incr reps. Will continue to progress towrads LTGs.    Personal Factors and Comorbidities Comorbidity 3+    Comorbidities non-hodgkins lymphoma, PD (diagnosed in 2015), cervical lymphadenopathy    Examination-Activity Limitations Stairs;Locomotion Level    Examination-Participation Restrictions Community Activity;Other   Exercise classes   Stability/Clinical Decision Making Evolving/Moderate complexity    Rehab Potential Good    PT Frequency 2x / week    PT Duration 4 weeks   plus eval   PT Treatment/Interventions ADLs/Self Care Home Management;Gait training;Stair training;Functional mobility training;Therapeutic activities;Patient/family education;Neuromuscular re-education;Balance training;Therapeutic exercise    PT Next Visit Plan Update HEP as appropriate; sit<>stand, working on postural strengthening and awareness; gait training and overall focus on intensity/amplitude of movement patterns and posture; stepping strategies for balance recovery. Resisted standing and gait (pt loves resistance!  - try it again with the machine)    Consulted and Agree with Plan of Care Patient           Patient will benefit from skilled therapeutic intervention in order to improve the following deficits and impairments:  Abnormal gait,Decreased  balance,Difficulty walking,Decreased strength,Postural dysfunction  Visit Diagnosis: Abnormal posture  Other symptoms and signs involving the nervous system  Unsteadiness on feet     Problem List Patient Active Problem List   Diagnosis Date Noted  . Healthcare maintenance 06/28/2020  . Gastroesophageal reflux disease 03/07/2020  . Localized swelling of right lower extremity 04/12/2019  . DOE (dyspnea on exertion) 07/15/2018  . 10 year risk of MI or stroke 7.5% or greater 04/12/2018  . 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 Dysphagia, neurologic 07/27/2014  . Parkinson disease (  [place]) 07/27/2014  . Dystonia 07/10/2014  . Low back pain 11/21/2011  . TRANSAMINASES, SERUM, ELEVATED 07/06/2008  . DIARRHEA 05/23/2008  . LYMPH NODE-ENLARGED 07/28/2007  . Non-Hodgkin lymphoma ([place]) 03/23/2007    Arliss Journey, PT, DPT  11/30/2020, 12:06 PM  [facility] [address] [place], [place], [address] Phone: [phone]   Fax:  [phone]  Name: MANSUR PATTI MRN: [id number] Date of Birth: 1951/06/18

## 2020-11-30 NOTE — Therapy (Signed)
[facility] [address] [place], [place], [address] Phone: [phone]   Fax:  [phone]  Occupational Therapy Treatment  Patient Details  Name: [name] [name] MRN: [id number] Date of Birth: 18-Oct-1950 Referring Provider (OT): Dr. Wells Guiles Tat   Encounter Date: 11/30/2020   OT End of Session - 11/30/20 1022    Visit Number 5    Number of Visits 9    Date for OT Re-Evaluation 12/08/20    Authorization Type Medicare & Aetna--Covered at 100%    Authorization - Visit Number 5    Authorization - Number of Visits 10    Progress Note Due on Visit 10    OT Start Time 7124    OT Stop Time 1057    OT Time Calculation (min) 39 min           Past Medical History:  Diagnosis Date  . Cervical lymphadenopathy    right - followed my ENT  . Dystonia 1994   diagnosed in [place]  . Non Hodgkin's lymphoma ([place]) 2007   diffuse- 6 cycles of chemo with R CHOP Rituxan - last dose 10/08  . Parkinson disease ([place])    2015    Past Surgical History:  Procedure Laterality Date  . CHOLECYSTECTOMY, LAPAROSCOPIC    . EYE SURGERY     x2 as child  . PORT-A-CATH REMOVAL    . PORTACATH PLACEMENT    . SPLENECTOMY    . TONSILLECTOMY      There were no vitals filed for this visit.   Subjective Assessment - 11/30/20 1021    Subjective  Pt reports writing check with good legibility    Pertinent History Parkinson's Disease.   PMH:  non-hodgkin lymphoma with hx of chemo, dystonia, Meige syndrome    Patient Stated Goals improve handwriting    Currently in Pain? No/denies                   Treatment: Review of handwriting strategies, PWR! hands basic 4 prior to writing. Pt used graph paper and wrote 3 sentences with 100% legibility.  Pt then wrote 3 sentences on lined paper with only minimal decrease in legibility 90%. PWR! Moves seated basic 4 10 reps each Ambulating while rotating 2 golf balls in hand and carrying on  conversation, for dual tasking. Stacking and manipulating coins in ahdn with Bilateral UE's for increased fine motor coordination, min v.c Removing items from wallet with good performance. Goal met.               OT Short Term Goals - 11/08/20 1816      OT SHORT TERM GOAL #1   Title ---             OT Long Term Goals - 11/30/20 1024      OT LONG TERM GOAL #1   Title Pt will verbalize understanding of strategies to incr ease/efficiency with ADLs/IADLs (including use of tremor compensation strategies for eating dinner, writing, getting money from wallet, getting items from floor/getting up from floor).--check LTGs 12/05/20    Time 4    Period Weeks    Status On-going      OT LONG TERM GOAL #2   Title Pt will be independent with updated HEP    Time 4    Period Weeks    Status On-going      OT LONG TERM GOAL #3   Title Pt will be able to write at least 3  sentences with at least 90% legibility.    Baseline 75%    Time 4    Period Weeks    Status On-going      OT LONG TERM GOAL #4   Title Pt will demo/report incr ease with seperating money in wallet.    Time 4    Period Weeks    Status Achieved      OT LONG TERM GOAL #5   Title Pt will improve LUE functional reaching as shown by improving score on box and blocks test by at least 4.    Baseline 39 blocks    Time 4    Period Weeks    Status On-going                 Plan - 11/30/20 1023    Clinical Impression Statement Pt's handwriting has improved. He reports writing checks with good legibility at home.    OT Occupational Profile and History Detailed Assessment- Review of Records and additional review of physical, cognitive, psychosocial history related to current functional performance    Occupational performance deficits (Please refer to evaluation for details): ADL's;IADL's;Leisure    Body Structure / Function / Physical Skills ADL;Balance;Improper spinal/pelvic alignment;Mobility;UE functional  use;FMC;Coordination;IADL;Dexterity;GMC    Cognitive Skills Memory    Rehab Potential Good    OT Frequency 2x / week    OT Duration 4 weeks    OT Treatment/Interventions Self-care/ADL training;Therapeutic exercise;Functional Mobility Training;Balance training;Manual Therapy;Neuromuscular education;Ultrasound;Aquatic Therapy;Energy conservation;Therapeutic activities;Cryotherapy;DME and/or AE instruction;Cognitive remediation/compensation;Passive range of motion;Patient/family education;Moist Heat    Plan big movements with ADLS, work towards unmet goals    Consulted and Agree with Plan of Care Patient           Patient will benefit from skilled therapeutic intervention in order to improve the following deficits and impairments:   Body Structure / Function / Physical Skills: ADL,Balance,Improper spinal/pelvic alignment,Mobility,UE functional use,FMC,Coordination,IADL,Dexterity,GMC Cognitive Skills: Memory     Visit Diagnosis: Other symptoms and signs involving the nervous system  Other lack of coordination  Tremor  Abnormal posture    Problem List Patient Active Problem List   Diagnosis Date Noted  . Healthcare maintenance 06/28/2020  . Gastroesophageal reflux disease 03/07/2020  . Localized swelling of right lower extremity 04/12/2019  . DOE (dyspnea on exertion) 07/15/2018  . 10 year risk of MI or stroke 7.5% or greater 04/12/2018  . 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 Dysphagia, neurologic 07/27/2014  . Parkinson disease ([place]) 07/27/2014  . Dystonia 07/10/2014  . Low back pain 11/21/2011  . TRANSAMINASES, SERUM, ELEVATED 07/06/2008  . DIARRHEA 05/23/2008  . LYMPH NODE-ENLARGED 07/28/2007  . Non-Hodgkin lymphoma ([place]) 03/23/2007    [name] [name] 11/30/2020, 11:04 AM  [facility] [address] [place], [place], [address] Phone:  [phone]   Fax:  [phone]  Name: [name] [name] MRN: [id number] Date of Birth: 1951/07/31

## 2020-12-03 ENCOUNTER — Other Ambulatory Visit: Payer: Self-pay | Admitting: Neurology

## 2020-12-04 ENCOUNTER — Ambulatory Visit: Payer: Medicare Other | Admitting: Occupational Therapy

## 2020-12-04 ENCOUNTER — Encounter: Payer: Self-pay | Admitting: Physical Therapy

## 2020-12-04 ENCOUNTER — Other Ambulatory Visit: Payer: Self-pay | Admitting: Neurology

## 2020-12-04 ENCOUNTER — Other Ambulatory Visit: Payer: Self-pay

## 2020-12-04 ENCOUNTER — Ambulatory Visit: Payer: Medicare Other | Admitting: Physical Therapy

## 2020-12-04 DIAGNOSIS — R293 Abnormal posture: Secondary | ICD-10-CM | POA: Diagnosis not present

## 2020-12-04 DIAGNOSIS — R2681 Unsteadiness on feet: Secondary | ICD-10-CM

## 2020-12-04 DIAGNOSIS — R2689 Other abnormalities of gait and mobility: Secondary | ICD-10-CM | POA: Diagnosis not present

## 2020-12-04 DIAGNOSIS — R29818 Other symptoms and signs involving the nervous system: Secondary | ICD-10-CM

## 2020-12-04 DIAGNOSIS — R278 Other lack of coordination: Secondary | ICD-10-CM | POA: Diagnosis not present

## 2020-12-04 DIAGNOSIS — R251 Tremor, unspecified: Secondary | ICD-10-CM

## 2020-12-04 MED ORDER — CLONAZEPAM 0.5 MG PO TABS: 0.2500 mg | ORAL_TABLET | Freq: Every day | ORAL | 1 refills | Status: DC

## 2020-12-04 NOTE — Therapy (Signed)
[facility] [address] [place], [place], [address] Phone: [phone]   Fax:  [phone]  Occupational Therapy Treatment  Patient Details  Name: [name] [name] MRN: [id number] Date of Birth: 07/21/1951 Referring Provider (OT): Dr. Wells Guiles Tat   Encounter Date: 12/04/2020   OT End of Session - 12/04/20 0834    Visit Number 6    Number of Visits 9    Date for OT Re-Evaluation 12/08/20    Authorization Type Medicare & Aetna--Covered at 100%    Authorization - Visit Number 6    Authorization - Number of Visits 10    Progress Note Due on Visit 10    OT Start Time 0804    OT Stop Time 0845    OT Time Calculation (min) 41 min    Activity Tolerance Patient tolerated treatment well    Behavior During Therapy [facility] for tasks assessed/performed           Past Medical History:  Diagnosis Date  . Cervical lymphadenopathy    right - followed my ENT  . Dystonia 1994   diagnosed in [place]  . Non Hodgkin's lymphoma ([place]) 2007   diffuse- 6 cycles of chemo with R CHOP Rituxan - last dose 10/08  . Parkinson disease ([place])    2015    Past Surgical History:  Procedure Laterality Date  . CHOLECYSTECTOMY, LAPAROSCOPIC    . EYE SURGERY     x2 as child  . PORT-A-CATH REMOVAL    . PORTACATH PLACEMENT    . SPLENECTOMY    . TONSILLECTOMY      There were no vitals filed for this visit.   Subjective Assessment - 12/04/20 0833    Subjective  This makes sense.  It's helpful    Pertinent History Parkinson's Disease.   PMH:  non-hodgkin lymphoma with hx of chemo, dystonia, Meige syndrome    Patient Stated Goals improve handwriting    Currently in Pain? No/denies            PWR! Moves (basic 4) in quadruped x 10-20 each with min cues For incr movement amplitude.  Standing, performing functional reaching for cylinder objects (laterally and overhead) with trunk rotation/diagonal stepping for wt. Shift and min cueing for  incr movement amplitude with reach each UE.  Standing, wt. Shift forward/back with tossing beanbags to targets with each UE with min v.c. for incr UE movement amplitude.  Education provided on use of diagonal step with same LE as direction change for turn.  Pt returned demo (with carrying tray in [place]) with good performance and Pt verbalized understanding of strategy.  Standing to pick up object from the floor with big movement strategy for incr safety and ease.  Emphasized feet apart, with 1 foot in front.  Pt returned demo well and verbalized understanding of strategy.    Writing with "the pencil grip" on graph paper.  Wrote 5-6 sentences on graph paper with good legibility size (copied).  Then copied 1 sentence on lined paper with good size and legibility.  Then wrote 3 lines that were self generated with approx 95% legibility and min decr in size.        OT Short Term Goals - 11/08/20 1816      OT SHORT TERM GOAL #1   Title ---             OT Long Term Goals - 11/30/20 1024      OT LONG TERM GOAL #1  Title Pt will verbalize understanding of strategies to incr ease/efficiency with ADLs/IADLs (including use of tremor compensation strategies for eating dinner, writing, getting money from wallet, getting items from floor/getting up from floor).--check LTGs 12/05/20    Time 4    Period Weeks    Status On-going      OT LONG TERM GOAL #2   Title Pt will be independent with updated HEP    Time 4    Period Weeks    Status On-going      OT LONG TERM GOAL #3   Title Pt will be able to write at least 3 sentences with at least 90% legibility.    Baseline 75%    Time 4    Period Weeks    Status On-going      OT LONG TERM GOAL #4   Title Pt will demo/report incr ease with seperating money in wallet.    Time 4    Period Weeks    Status Achieved      OT LONG TERM GOAL #5   Title Pt will improve LUE functional reaching as shown by improving score on box and blocks test by at  least 4.    Baseline 39 blocks    Time 4    Period Weeks    Status On-going                 Plan - 12/04/20 0835    Clinical Impression Statement Pt demo improved balance/incr ease with functional movement strategies (large amplitude and balance strategies).    OT Occupational Profile and History Detailed Assessment- Review of Records and additional review of physical, cognitive, psychosocial history related to current functional performance    Occupational performance deficits (Please refer to evaluation for details): ADL's;IADL's;Leisure    Body Structure / Function / Physical Skills ADL;Balance;Improper spinal/pelvic alignment;Mobility;UE functional use;FMC;Coordination;IADL;Dexterity;GMC    Cognitive Skills Memory    Rehab Potential Good    OT Frequency 2x / week    OT Duration 4 weeks    OT Treatment/Interventions Self-care/ADL training;Therapeutic exercise;Functional Mobility Training;Balance training;Manual Therapy;Neuromuscular education;Ultrasound;Aquatic Therapy;Energy conservation;Therapeutic activities;Cryotherapy;DME and/or AE instruction;Cognitive remediation/compensation;Passive range of motion;Patient/family education;Moist Heat    Plan big movements with ADLS, work towards unmet goals    Consulted and Agree with Plan of Care Patient           Patient will benefit from skilled therapeutic intervention in order to improve the following deficits and impairments:   Body Structure / Function / Physical Skills: ADL,Balance,Improper spinal/pelvic alignment,Mobility,UE functional use,FMC,Coordination,IADL,Dexterity,GMC Cognitive Skills: Memory     Visit Diagnosis: Other symptoms and signs involving the nervous system  Other lack of coordination  Tremor  Abnormal posture  Unsteadiness on feet  Other abnormalities of gait and mobility    Problem List Patient Active Problem List   Diagnosis Date Noted  . Healthcare maintenance 06/28/2020  .  Gastroesophageal reflux disease 03/07/2020  . Localized swelling of right lower extremity 04/12/2019  . DOE (dyspnea on exertion) 07/15/2018  . 10 year risk of MI or stroke 7.5% or greater 04/12/2018  . 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 Dysphagia, neurologic 07/27/2014  . Parkinson disease ([place]) 07/27/2014  . Dystonia 07/10/2014  . Low back pain 11/21/2011  . TRANSAMINASES, SERUM, ELEVATED 07/06/2008  . DIARRHEA 05/23/2008  . LYMPH NODE-ENLARGED 07/28/2007  . Non-Hodgkin lymphoma ([place]) 03/23/2007    [facility] 12/04/2020, 8:38 AM  [facility] [address]  [place], [place], [address] Phone: [phone]   Fax:  [phone]  Name: LYAN MOYANO MRN: [id number] Date of Birth: 11-Mar-1951   Vianne Bulls, OTR/L [facility] [address] [place], [place]  [address] [phone] phone [phone] 12/04/20 8:39 AM

## 2020-12-04 NOTE — Therapy (Signed)
[facility] [address] [place], [place], [address] Phone: [phone]   Fax:  [phone]  Physical Therapy Treatment  Patient Details  Name: [name] [name] MRN: [id number] Date of Birth: 08/28/1951 Referring Provider (PT): Wells Guiles, Tat DO   Encounter Date: 12/04/2020   PT End of Session - 12/04/20 0850    Visit Number 6    Number of Visits 9    Date for PT Re-Evaluation 01/07/21    Authorization Type Medicare/Aetna    PT Start Time 0849    PT Stop Time 0929    PT Time Calculation (min) 40 min    Activity Tolerance Patient tolerated treatment well    Behavior During Therapy [facility] for tasks assessed/performed           Past Medical History:  Diagnosis Date  . Cervical lymphadenopathy    right - followed my ENT  . Dystonia 1994   diagnosed in [place]  . Non Hodgkin's lymphoma ([place]) 2007   diffuse- 6 cycles of chemo with R CHOP Rituxan - last dose 10/08  . Parkinson disease ([place])    2015    Past Surgical History:  Procedure Laterality Date  . CHOLECYSTECTOMY, LAPAROSCOPIC    . EYE SURGERY     x2 as child  . PORT-A-CATH REMOVAL    . PORTACATH PLACEMENT    . SPLENECTOMY    . TONSILLECTOMY      There were no vitals filed for this visit.   Subjective Assessment - 12/04/20 0850    Subjective No changes, nothing new.    Pertinent History non-hodgkins lymphoma, PD (diagnosed in 2015), cervical lymphadenopathy    Patient Stated Goals No specific goals-main thing I want is for the class (PWR! Moves) to get going again.    Currently in Pain? No/denies                             [facility] Adult PT Treatment/Exercise - 12/04/20 0001      Transfers   Transfers Sit to Stand;Stand to Sit    Sit to Stand 6: Modified independent (Device/Increase time);Without upper extremity assist;From chair/3-in-1    Stand to Sit 6: Modified independent (Device/Increase time);Without upper extremity  assist;To chair/3-in-1    Number of Reps 1 set   Standing on [employer], from 20" mat     Ambulation/Gait   Ambulation/Gait Yes    Ambulation/Gait Assistance 6: Modified independent (Device/Increase time)    Ambulation/Gait Assistance Details Resisted gait in clinic, 230 ft, with quick stop/starts, cues to PWR! Up to get balance with quick stops.  Additional 115 ft gait no device, no resistance with continued upight posture.    Ambulation Distance (Feet) 600 Feet    Assistive device None    Gait Pattern Step-through pattern;Decreased trunk rotation;Trunk flexed    Ambulation Surface Level;Indoor    Stairs Yes    Stairs Assistance 6: Modified independent (Device/Increase time)    Stair Management Technique No rails   Cues for full foot placement, foot clearance   Number of Stairs 4   x 2   Height of Stairs 6      Knee/Hip Exercises: Aerobic   Stepper Seated SciFit Stepper, Level 2, 4 extremities 8 minutes, cues to keep RPM 75-80, for increased intensity of motion and aerobic activity.  Rates effort as 7/10 level            Neuro Re-education: Resisted stepping with belt  attached to machine: lateral stepping directions each way x5 reps (approx 25') - cues to PWR Up before stepping and before slowly stepping back to midline -pt initially with smaller steps and almost losing balance stepping back to machine, but improved with 2nd and 3rd reps, min guard for balance, initial cues for incr step height and step length.  Forward walking from machine, x 5 reps, approx 25 ft - initial cues for incr step length, PWR! Up upon coming to end of resistance, then smooth transition to backwards walking.  Cues for increased step length and wide BOS.          PT Education - 12/04/20 1032    Education Details Discussed potential for speech therapy.  Pt declines to do speech therapy at this time, as he will go back to his previous speech therapy exercises, attention to voice volume (he does report keeping  voice volume louder in situations with [employer], volunteer phone calls).  He would like to schedule speech therapy return eval when PT/OT schedule return evals.    Person(s) Educated Patient    Methods Explanation    Comprehension Verbalized understanding            PT Short Term Goals - 01/05/20 1225      PT SHORT TERM GOAL #1   Title ALL STGS = LTGS             PT Long Term Goals - 11/08/20 1457      PT LONG TERM GOAL #1   Title Pt will be independent with HEP and community fitness plans upon d/c from PT for continuing to maximize gains made in PT.  TARGET 12/07/2020    Time 4    Period Weeks    Status New      PT LONG TERM GOAL #2   Title Pt will improve gait velocity to at least 4 ft/sec for improved gait efficiency and safety in the community.    Time 4    Period Weeks    Status New      PT LONG TERM GOAL #3   Title Pt will perform posterior push and release test in 2 steps or less, for improved posterior balance recovery.    Time 4    Period Weeks    Status New      PT LONG TERM GOAL #4   Title Pt will negotiate at least 4 steps, 4 reps, with appropriate foot clearance with ascending and descending stairs.    Time 4    Period Weeks    Status New                 Plan - 12/04/20 1034    Clinical Impression Statement Continued skilled PT focus on increased intensity of movement patterns to assist with improved posture, balance with gait activities.  He responds well to resisted gait and standing activities, and PT explained rationale for use of resisted gait and standing for improved intensity and amplitude of movement patterns.  He does need cues to make sure to take BIG steps to get balance initiaally in posterior direction.  He will continue to benefit from skilled PT towards LTGs for improved overall functional mobility.    Personal Factors and Comorbidities Comorbidity 3+    Comorbidities non-hodgkins lymphoma, PD (diagnosed  in 2015), cervical lymphadenopathy    Examination-Activity Limitations Stairs;Locomotion Level    Examination-Participation Restrictions Community Activity;Other   Exercise classes   Stability/Clinical Decision Making Evolving/Moderate  complexity    Rehab Potential Good    PT Frequency 2x / week    PT Duration 4 weeks   plus eval   PT Treatment/Interventions ADLs/Self Care Home Management;Gait training;Stair training;Functional mobility training;Therapeutic activities;Patient/family education;Neuromuscular re-education;Balance training;Therapeutic exercise    PT Next Visit Plan Continue with PWR! Moves review, if needed; sit<>stand, working on postural strengthening and awareness; gait training and overall focus on intensity/amplitude of movement patterns and posture; stepping strategies for balance recovery. Resisted standing and gait (pt loves resistance!  - try it again with the machine)-focus on transitions of movements in and out of resistance    Consulted and Agree with Plan of Care Patient           Patient will benefit from skilled therapeutic intervention in order to improve the following deficits and impairments:  Abnormal gait,Decreased balance,Difficulty walking,Decreased strength,Postural dysfunction  Visit Diagnosis: Other abnormalities of gait and mobility  Unsteadiness on feet  Abnormal posture     Problem List Patient Active Problem List   Diagnosis Date Noted  . Healthcare maintenance 06/28/2020  . Gastroesophageal reflux disease 03/07/2020  . Localized swelling of right lower extremity 04/12/2019  . DOE (dyspnea on exertion) 07/15/2018  . 10 year risk of MI or stroke 7.5% or greater 04/12/2018  . 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 Dysphagia, neurologic 07/27/2014  . Parkinson disease ([place]) 07/27/2014  . Dystonia 07/10/2014  . Low back pain 11/21/2011  . TRANSAMINASES, SERUM, ELEVATED  07/06/2008  . DIARRHEA 05/23/2008  . LYMPH NODE-ENLARGED 07/28/2007  . Non-Hodgkin lymphoma ([place]) 03/23/2007    Isela Stantz W. 12/04/2020, 10:39 AM  Frazier Butt., PT   [facility] [address] [place], [place], [address] Phone: [phone]   Fax:  [phone]  Name: [name] [name] MRN: [id number] Date of Birth: 06-12-1951

## 2020-12-04 NOTE — Telephone Encounter (Signed)
Patient's wife called in to get a refill for the clonazepam. The pharmacy told her they needed a new prescription.

## 2020-12-06 ENCOUNTER — Ambulatory Visit: Payer: Medicare Other | Admitting: Occupational Therapy

## 2020-12-06 ENCOUNTER — Ambulatory Visit: Payer: Medicare Other | Admitting: Physical Therapy

## 2020-12-06 ENCOUNTER — Other Ambulatory Visit: Payer: Self-pay

## 2020-12-06 ENCOUNTER — Encounter: Payer: Self-pay | Admitting: Physical Therapy

## 2020-12-06 DIAGNOSIS — R251 Tremor, unspecified: Secondary | ICD-10-CM

## 2020-12-06 DIAGNOSIS — R2689 Other abnormalities of gait and mobility: Secondary | ICD-10-CM

## 2020-12-06 DIAGNOSIS — R293 Abnormal posture: Secondary | ICD-10-CM

## 2020-12-06 DIAGNOSIS — R278 Other lack of coordination: Secondary | ICD-10-CM

## 2020-12-06 DIAGNOSIS — R2681 Unsteadiness on feet: Secondary | ICD-10-CM

## 2020-12-06 DIAGNOSIS — R29818 Other symptoms and signs involving the nervous system: Secondary | ICD-10-CM

## 2020-12-06 NOTE — Therapy (Signed)
[facility] [address] [place], [place], [address] Phone: [phone]   Fax:  [phone]  Occupational Therapy Treatment  Patient Details  Name: [name] [name] MRN: [id number] Date of Birth: 11/29/1950 Referring Provider (OT): Dr. Wells Guiles Tat   Encounter Date: 12/06/2020   OT End of Session - 12/06/20 0854    Visit Number 7    Number of Visits 9    Date for OT Re-Evaluation 12/08/20    Authorization Type Medicare & Aetna--Covered at 100%    Authorization - Visit Number 7    Authorization - Number of Visits 10    Progress Note Due on Visit 10    OT Start Time [phone]    OT Stop Time 0930    OT Time Calculation (min) 38 min    Activity Tolerance Patient tolerated treatment well    Behavior During Therapy [facility] for tasks assessed/performed           Past Medical History:  Diagnosis Date  . Cervical lymphadenopathy    right - followed my ENT  . Dystonia 1994   diagnosed in [place]  . Non Hodgkin's lymphoma ([place]) 2007   diffuse- 6 cycles of chemo with R CHOP Rituxan - last dose 10/08  . Parkinson disease ([place])    2015    Past Surgical History:  Procedure Laterality Date  . CHOLECYSTECTOMY, LAPAROSCOPIC    . EYE SURGERY     x2 as child  . PORT-A-CATH REMOVAL    . PORTACATH PLACEMENT    . SPLENECTOMY    . TONSILLECTOMY      There were no vitals filed for this visit.   Subjective Assessment - 12/06/20 0853    Subjective  Keeping one foot in front of the other helps    Pertinent History Parkinson's Disease.   PMH:  non-hodgkin lymphoma with hx of chemo, dystonia, Meige syndrome    Patient Stated Goals improve handwriting    Currently in Pain? No/denies           Rotating 2 golf balls with mod difficulty with each hand for incr coordination.   Tossing/catching ball in each hand, then between hands, then juggling 2 balls with min cueing/mod difficulty for timing and hypokinesia.  Juggling 2  scarves with very minimal difficulty for incr coordination.  Placing O'connor pegs in pegboard with tweezers with [name] with min difficulty, Max difficulty/unable with [name], but removed using tweezers with [name].   Functional step and reach forward/back with each UE/LE (step though) with min cueing/incr difficulty with L side stepping/balance initially, but improved with repetition.         OT Short Term Goals - 11/08/20 1816      OT SHORT TERM GOAL #1   Title ---             OT Long Term Goals - 12/06/20 0907      OT LONG TERM GOAL #1   Title Pt will verbalize understanding of strategies to incr ease/efficiency with ADLs/IADLs (including use of tremor compensation strategies for eating dinner, writing, getting money from wallet, getting items from floor/getting up from floor).--check LTGs 12/05/20    Time 4    Period Weeks    Status On-going      OT LONG TERM GOAL #2   Title Pt will be independent with updated HEP    Time 4    Period Weeks    Status Achieved      OT  LONG TERM GOAL #3   Title Pt will be able to write at least 3 sentences with at least 90% legibility.    Baseline 75%    Time 4    Period Weeks    Status On-going      OT LONG TERM GOAL #4   Title Pt will demo/report incr ease with seperating money in wallet.    Time 4    Period Weeks    Status Achieved      OT LONG TERM GOAL #5   Title Pt will improve LUE functional reaching as shown by improving score on box and blocks test by at least 4.    Baseline 39 blocks    Time 4    Period Weeks    Status On-going                 Plan - 12/06/20 9518    Clinical Impression Statement Pt is progressing towards goals with improving coordination and movement amplitude.    OT Occupational Profile and History Detailed Assessment- Review of Records and additional review of physical, cognitive, psychosocial history related to current functional performance    Occupational performance deficits (Please  refer to evaluation for details): ADL's;IADL's;Leisure    Body Structure / Function / Physical Skills ADL;Balance;Improper spinal/pelvic alignment;Mobility;UE functional use;FMC;Coordination;IADL;Dexterity;GMC    Cognitive Skills Memory    Rehab Potential Good    OT Frequency 2x / week    OT Duration 4 weeks    OT Treatment/Interventions Self-care/ADL training;Therapeutic exercise;Functional Mobility Training;Balance training;Manual Therapy;Neuromuscular education;Ultrasound;Aquatic Therapy;Energy conservation;Therapeutic activities;Cryotherapy;DME and/or AE instruction;Cognitive remediation/compensation;Passive range of motion;Patient/family education;Moist Heat    Plan check goals and anticipate d/c next week    Consulted and Agree with Plan of Care Patient           Patient will benefit from skilled therapeutic intervention in order to improve the following deficits and impairments:   Body Structure / Function / Physical Skills: ADL,Balance,Improper spinal/pelvic alignment,Mobility,UE functional use,FMC,Coordination,IADL,Dexterity,GMC Cognitive Skills: Memory     Visit Diagnosis: Other symptoms and signs involving the nervous system  Other lack of coordination  Tremor  Abnormal posture  Unsteadiness on feet    Problem List Patient Active Problem List   Diagnosis Date Noted  . Healthcare maintenance 06/28/2020  . Gastroesophageal reflux disease 03/07/2020  . Localized swelling of right lower extremity 04/12/2019  . DOE (dyspnea on exertion) 07/15/2018  . 10 year risk of MI or stroke 7.5% or greater 04/12/2018  . 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 Dysphagia, neurologic 07/27/2014  . Parkinson disease ([place]) 07/27/2014  . Dystonia 07/10/2014  . Low back pain 11/21/2011  . TRANSAMINASES, SERUM, ELEVATED 07/06/2008  . DIARRHEA 05/23/2008  . LYMPH NODE-ENLARGED 07/28/2007  . Non-Hodgkin lymphoma ([place])  03/23/2007    [facility] 12/06/2020, 10:13 AM  [facility] [address] [place], [place], [address] Phone: [phone]   Fax:  [phone]  Name: [name] [name] MRN: [id number] Date of Birth: 1951-04-05   Vianne Bulls, OTR/L [facility] [address] [place], [place]  [address] [phone] phone [phone] 12/06/20 10:13 AM

## 2020-12-06 NOTE — Therapy (Signed)
[facility] [address] [place], [place], [address] Phone: [phone]   Fax:  [phone]  Physical Therapy Treatment  Patient Details  Name: [name] [name] MRN: [id number] Date of Birth: 1950/10/25 Referring Provider (PT): Wells Guiles, Tat DO   Encounter Date: 12/06/2020   PT End of Session - 12/06/20 0942    Visit Number 7    Number of Visits 9    Date for PT Re-Evaluation 01/07/21    Authorization Type Medicare/Aetna    PT Start Time 0935    PT Stop Time 1013    PT Time Calculation (min) 38 min    Activity Tolerance Patient tolerated treatment well   Slight increase in pain to 3/10 at end of session.   Behavior During Therapy [facility] for tasks assessed/performed           Past Medical History:  Diagnosis Date  . Cervical lymphadenopathy    right - followed my ENT  . Dystonia 1994   diagnosed in [place]  . Non Hodgkin's lymphoma ([place]) 2007   diffuse- 6 cycles of chemo with R CHOP Rituxan - last dose 10/08  . Parkinson disease ([place])    2015    Past Surgical History:  Procedure Laterality Date  . CHOLECYSTECTOMY, LAPAROSCOPIC    . EYE SURGERY     x2 as child  . PORT-A-CATH REMOVAL    . PORTACATH PLACEMENT    . SPLENECTOMY    . TONSILLECTOMY      There were no vitals filed for this visit.   Subjective Assessment - 12/06/20 0936    Subjective No changes.  Hip is hurting a little-the resistance wore me out.    Pertinent History non-hodgkins lymphoma, PD (diagnosed in 2015), cervical lymphadenopathy    Patient Stated Goals No specific goals-main thing I want is for the class (PWR! Moves) to get going again.    Currently in Pain? Yes    Pain Score 2     Pain Location Hip   SI joint   Pain Orientation Right    Pain Descriptors / Indicators --   Twinge   Pain Type Acute pain    Pain Onset In the past 7 days    Pain Frequency Intermittent    Aggravating Factors  ?resisted movements    Pain Relieving  Factors gentle movements                             [facility] Adult PT Treatment/Exercise - 12/06/20 0001      Ambulation/Gait   Ambulation/Gait Yes    Ambulation/Gait Assistance 6: Modified independent (Device/Increase time)    Ambulation/Gait Assistance Details No resistance provided with gait today.  Worked on dynamic gait activities:  forward/backward, stops/starts; head turns/head nods.    Ambulation Distance (Feet) 500 Feet    Assistive device None    Gait Pattern Step-through pattern;Decreased trunk rotation;Trunk flexed    Ambulation Surface Level;Indoor    Stairs Yes    Stairs Assistance 6: Modified independent (Device/Increase time)    Stair Management Technique No rails   Cues for full foot placement, foot clearance; cues to count steps at home to ensure attention to foot clearance   Number of Stairs 4   x 3   Height of Stairs 6      Posture/Postural Control   Posture/Postural Control Postural limitations    Postural Limitations Forward head;Rounded Shoulders;Flexed trunk;Posterior pelvic tilt;Weight shift right  Posture Comments Standing tall as the wall, scapular squeezes x 10 reps.  Also cues for PWR! Up posture in standing, as pt feels PWR! Up is a better postural cue for him than standing tall as the wall.      Therapeutic Activites    Therapeutic Activities Other Therapeutic Activities    Other Therapeutic Activities Standing stretching at counter, forward/back direction through low back and hips, x 8 reps, with increased pain noted with lumbar extension.  Pt reports pain in "hip", but where he points is more like R SI joint (at times L SI joint), which he reports has been sore since lateral stepping resistance last visit in therapy.  It is improving per pateint report and he's had this pain before, but not recently.  Pt not tender to palpation, but points to SI joint area is where pain is.  Educated pt to avoid activities that aggravate pain and use ice  as needed to help with pain.  Pt verbalizes understanding.           Seated PWR! Up for posture, 3 reps, slow sustained hold, then 10 reps more activation/intention Sit to stand PWR! Up x 3 reps  Standing PWR! Up for posture, 3 reps, slow sustained hold, then 10 reps with activation and intensity Standing PWR! Rock for [place] x 10 reps Standing PWR! Twist for trunk rotation x 10 Standing PWR! Step-side, back, forward, x 10 reps each for improved step initiation.  Pt reports slight increase in SI joint pain with step back  Cues for increased foot clearance with stepping exercises.        PT Education - 12/06/20 1356    Education Details Discussed pain from last few days in SI joint area R>L; advised to hold off on activities that aggravate pain and continue to take movements more easy (as pain is improving/almost gone)    Person(s) Educated Patient    Methods Explanation    Comprehension Verbalized understanding            PT Short Term Goals - 01/05/20 1225      PT SHORT TERM GOAL #1   Title ALL STGS = LTGS             PT Long Term Goals - 11/08/20 1457      PT LONG TERM GOAL #1   Title Pt will be independent with HEP and community fitness plans upon d/c from PT for continuing to maximize gains made in PT.  TARGET 12/07/2020    Time 4    Period Weeks    Status New      PT LONG TERM GOAL #2   Title Pt will improve gait velocity to at least 4 ft/sec for improved gait efficiency and safety in the community.    Time 4    Period Weeks    Status New      PT LONG TERM GOAL #3   Title Pt will perform posterior push and release test in 2 steps or less, for improved posterior balance recovery.    Time 4    Period Weeks    Status New      PT LONG TERM GOAL #4   Title Pt will negotiate at least 4 steps, 4 reps, with appropriate foot clearance with ascending and descending stairs.    Time 4    Period Weeks    Status New  Plan -  12/06/20 1358    Clinical Impression Statement Worked on [employer]! Moves in varied positions as well as dynamic gait and posture activities this visit.  Pt does report he has had some pain (he reports "hip", but area of pain he points to is SI joint, R>L) following resisted gait activities last visit.  Pt not tender to palpation, and he responds well to rhythmic stablization exercises, for improved trunk control.  Likely, he used compensations of trunk and hip muscles with resisted gait activities with fatigue last visit.  He will continue to beneift from skilled PT to further address posture, balance, gait towards LTGs.    Personal Factors and Comorbidities Comorbidity 3+    Comorbidities non-hodgkins lymphoma, PD (diagnosed in 2015), cervical lymphadenopathy    Examination-Activity Limitations Stairs;Locomotion Level    Examination-Participation Restrictions Community Activity;Other   Exercise classes   Stability/Clinical Decision Making Evolving/Moderate complexity    Rehab Potential Good    PT Frequency 2x / week    PT Duration 4 weeks   plus eval   PT Treatment/Interventions ADLs/Self Care Home Management;Gait training;Stair training;Functional mobility training;Therapeutic activities;Patient/family education;Neuromuscular re-education;Balance training;Therapeutic exercise    PT Next Visit Plan Stepping strategies for balance recovery, postural strengthening; focus on transitions of movement; check LTGs and plan for d/c next week.    Consulted and Agree with Plan of Care Patient           Patient will benefit from skilled therapeutic intervention in order to improve the following deficits and impairments:  Abnormal gait,Decreased balance,Difficulty walking,Decreased strength,Postural dysfunction  Visit Diagnosis: Unsteadiness on feet  Abnormal posture  Other abnormalities of gait and mobility     Problem List Patient Active Problem List   Diagnosis Date Noted  . Healthcare  maintenance 06/28/2020  . Gastroesophageal reflux disease 03/07/2020  . Localized swelling of right lower extremity 04/12/2019  . DOE (dyspnea on exertion) 07/15/2018  . 10 year risk of MI or stroke 7.5% or greater 04/12/2018  . 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 Dysphagia, neurologic 07/27/2014  . Parkinson disease ([place]) 07/27/2014  . Dystonia 07/10/2014  . Low back pain 11/21/2011  . TRANSAMINASES, SERUM, ELEVATED 07/06/2008  . DIARRHEA 05/23/2008  . LYMPH NODE-ENLARGED 07/28/2007  . Non-Hodgkin lymphoma ([place]) 03/23/2007    Lacara Dunsworth W. 12/06/2020, 2:02 PM  Frazier Butt., PT   [facility] [address] [place], [place], [address] Phone: [phone]   Fax:  [phone]  Name: [name] [name] MRN: [id number] Date of Birth: June 13, 1951

## 2020-12-11 ENCOUNTER — Ambulatory Visit: Payer: Medicare Other | Admitting: Physical Therapy

## 2020-12-11 ENCOUNTER — Ambulatory Visit: Payer: Medicare Other | Admitting: Occupational Therapy

## 2020-12-11 ENCOUNTER — Encounter: Payer: Self-pay | Admitting: Physical Therapy

## 2020-12-11 ENCOUNTER — Other Ambulatory Visit: Payer: Self-pay

## 2020-12-11 DIAGNOSIS — R2689 Other abnormalities of gait and mobility: Secondary | ICD-10-CM | POA: Diagnosis not present

## 2020-12-11 DIAGNOSIS — R293 Abnormal posture: Secondary | ICD-10-CM

## 2020-12-11 DIAGNOSIS — R2681 Unsteadiness on feet: Secondary | ICD-10-CM

## 2020-12-11 DIAGNOSIS — R251 Tremor, unspecified: Secondary | ICD-10-CM

## 2020-12-11 DIAGNOSIS — R278 Other lack of coordination: Secondary | ICD-10-CM

## 2020-12-11 DIAGNOSIS — R29818 Other symptoms and signs involving the nervous system: Secondary | ICD-10-CM | POA: Diagnosis not present

## 2020-12-11 NOTE — Therapy (Signed)
[facility] [address] [place], [place], [address] Phone: [phone]   Fax:  [phone]  Physical Therapy Treatment  Patient Details  Name: [name] [name] MRN: [id number] Date of Birth: March 08, 1951 Referring Provider (PT): Wells Guiles, Tat DO   Encounter Date: 12/11/2020   PT End of Session - 12/11/20 0809    Visit Number 8    Number of Visits 9    Date for PT Re-Evaluation 01/07/21    Authorization Type Medicare/Aetna    PT Start Time 0806    PT Stop Time 8756    PT Time Calculation (min) 38 min    Activity Tolerance Patient tolerated treatment well   Slight increase in pain to 3/10 at end of session.   Behavior During Therapy [facility] for tasks assessed/performed           Past Medical History:  Diagnosis Date  . Cervical lymphadenopathy    right - followed my ENT  . Dystonia 1994   diagnosed in [place]  . Non Hodgkin's lymphoma ([place]) 2007   diffuse- 6 cycles of chemo with R CHOP Rituxan - last dose 10/08  . Parkinson disease ([place])    2015    Past Surgical History:  Procedure Laterality Date  . CHOLECYSTECTOMY, LAPAROSCOPIC    . EYE SURGERY     x2 as child  . PORT-A-CATH REMOVAL    . PORTACATH PLACEMENT    . SPLENECTOMY    . TONSILLECTOMY      There were no vitals filed for this visit.   Subjective Assessment - 12/11/20 0807    Subjective Hip is a lot better.  Feel it only a little bit.  Had a good time on Friday with grandparents day.    Pertinent History non-hodgkins lymphoma, PD (diagnosed in 2015), cervical lymphadenopathy    Patient Stated Goals No specific goals-main thing I want is for the class (PWR! Moves) to get going again.    Currently in Pain? Yes    Pain Score --   0.5   Pain Location Hip    Pain Orientation Right    Pain Descriptors / Indicators --   Twinge   Pain Type Acute pain    Pain Onset In the past 7 days    Pain Frequency Intermittent    Aggravating Factors  got better  with rest, then re-aggravated with moving stones in yard    Pain Relieving Factors rest, gentle movements                             [facility] Adult PT Treatment/Exercise - 12/11/20 0001      Transfers   Transfers Sit to Stand;Stand to Sit    Sit to Stand 6: Modified independent (Device/Increase time);Without upper extremity assist;From chair/3-in-1      Ambulation/Gait   Ambulation/Gait Yes    Ambulation/Gait Assistance 6: Modified independent (Device/Increase time)    Ambulation/Gait Assistance Details Gait with added conversation tasks, no LOB, but occasional slowing.    Ambulation Distance (Feet) 600 Feet    Assistive device None    Gait Pattern Step-through pattern;Decreased trunk rotation;Trunk flexed    Ambulation Surface Level;Indoor    Gait velocity 7.34 sec = 4.47 ft/sec    Stairs Yes    Stairs Assistance 6: Modified independent (Device/Increase time)    Stair Management Technique No rails    Number of Stairs 4   x 3   Height of  Stairs 6               Balance Exercises - 12/11/20 0001      Balance Exercises: Standing   Stepping Strategy Anterior;Posterior;Limitations;Lateral;10 reps    Stepping Strategy Limitations Forward and lateral directions, stepping over hurdles; in backward direction, holding to counter.    Retro Gait 5 reps   Along counter, forward   Sidestepping 3 reps;Limitations    Sidestepping Limitations R and L along counter, added coordinated UE movements    Other Standing Exercises Multidirectional stepping, over hurdles, clockwise and counterclockwise, x 2 reps each, then with hurdle removed x 2 reps each direction, intermittent UE support    Other Standing Exercises Comments Gait activities incorporating turns/change of directions; squatting/reaching, picking up 8 cones; then 2nd set with added cognitive challenge.  With additional cognitive challenge, cues for wider turning steps to initiate change of directions.                PT Short Term Goals - 01/05/20 1225      PT SHORT TERM GOAL #1   Title ALL STGS = LTGS             PT Long Term Goals - 12/11/20 1308      PT LONG TERM GOAL #1   Title Pt will be independent with HEP and community fitness plans upon d/c from PT for continuing to maximize gains made in PT.  TARGET 12/07/2020    Time 4    Period Weeks    Status New      PT LONG TERM GOAL #2   Title Pt will improve gait velocity to at least 4 ft/sec for improved gait efficiency and safety in the community.    Baseline 4.47 ft/sec    Time 4    Period Weeks    Status Achieved      PT LONG TERM GOAL #3   Title Pt will perform posterior push and release test in 2 steps or less, for improved posterior balance recovery.    Time 4    Period Weeks    Status New      PT LONG TERM GOAL #4   Title Pt will negotiate at least 4 steps, 4 reps, with appropriate foot clearance with ascending and descending stairs.    Time 4    Period Weeks    Status Achieved                 Plan - 12/11/20 6578    Clinical Impression Statement Began assessing LTGs this visit, with pt meeting LTG 2 for improved gait velocity and LTG 4 for improved stair negotiation.  Remainder of session focused on stepping strategies, multi-directional stepping, and added cognitive tasks with short distance gait and turns.  With addition of cognitive load, pt requires cues to maintain large movement patterns.  He is on target towards remainder of goals and plan for d/c next visit.    Personal Factors and Comorbidities Comorbidity 3+    Comorbidities non-hodgkins lymphoma, PD (diagnosed in 2015), cervical lymphadenopathy    Examination-Activity Limitations Stairs;Locomotion Level    Examination-Participation Restrictions Community Activity;Other   Exercise classes   Stability/Clinical Decision Making Evolving/Moderate complexity    Rehab Potential Good    PT Frequency 2x / week    PT Duration 4 weeks   plus eval   PT  Treatment/Interventions ADLs/Self Care Home Management;Gait training;Stair training;Functional mobility training;Therapeutic activities;Patient/family education;Neuromuscular re-education;Balance training;Therapeutic exercise    PT Next  Visit Plan Check remaining LTGs and plan for d/c next visit.    Consulted and Agree with Plan of Care Patient           Patient will benefit from skilled therapeutic intervention in order to improve the following deficits and impairments:  Abnormal gait,Decreased balance,Difficulty walking,Decreased strength,Postural dysfunction  Visit Diagnosis: Unsteadiness on feet  Other abnormalities of gait and mobility  Abnormal posture     Problem List Patient Active Problem List   Diagnosis Date Noted  . Healthcare maintenance 06/28/2020  . Gastroesophageal reflux disease 03/07/2020  . Localized swelling of right lower extremity 04/12/2019  . DOE (dyspnea on exertion) 07/15/2018  . 10 year risk of MI or stroke 7.5% or greater 04/12/2018  . 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 Dysphagia, neurologic 07/27/2014  . Parkinson disease ([place]) 07/27/2014  . Dystonia 07/10/2014  . Low back pain 11/21/2011  . TRANSAMINASES, SERUM, ELEVATED 07/06/2008  . DIARRHEA 05/23/2008  . LYMPH NODE-ENLARGED 07/28/2007  . Non-Hodgkin lymphoma ([place]) 03/23/2007    [place] W. 12/11/2020, 9:55 AM  Frazier Butt., PT   [facility] [address] [place], [place], [address] Phone: [phone]   Fax:  [phone]  Name: [name] [name] MRN: [id number] Date of Birth: 05-Mar-1951

## 2020-12-11 NOTE — Therapy (Signed)
[facility] [address] [place], [place], [address] Phone: [phone]   Fax:  [phone]  Occupational Therapy Treatment  Patient Details  Name: [name] [name] MRN: [id number] Date of Birth: June 06, 1951 Referring Provider (OT): Dr. Wells Guiles Tat   Encounter Date: 12/11/2020   OT End of Session - 12/11/20 0835    Visit Number 8    Number of Visits 9    Date for OT Re-Evaluation 12/08/20    Authorization Type Medicare & Aetna--Covered at 100%    Authorization - Visit Number 8    Authorization - Number of Visits 10    Progress Note Due on Visit 10    OT Start Time 0850    OT Stop Time 0930    OT Time Calculation (min) 40 min    Activity Tolerance Patient tolerated treatment well    Behavior During Therapy [facility] for tasks assessed/performed           Past Medical History:  Diagnosis Date  . Cervical lymphadenopathy    right - followed my ENT  . Dystonia 1994   diagnosed in [place]  . Non Hodgkin's lymphoma ([place]) 2007   diffuse- 6 cycles of chemo with R CHOP Rituxan - last dose 10/08  . Parkinson disease ([place])    2015    Past Surgical History:  Procedure Laterality Date  . CHOLECYSTECTOMY, LAPAROSCOPIC    . EYE SURGERY     x2 as child  . PORT-A-CATH REMOVAL    . PORTACATH PLACEMENT    . SPLENECTOMY    . TONSILLECTOMY      There were no vitals filed for this visit.   Subjective Assessment - 12/11/20 0835    Subjective  Pt reports that he has not been having trouble with tremors at dinner anymore and has been automatically been opening R hand prior to eating.    Pertinent History Parkinson's Disease.   PMH:  non-hodgkin lymphoma with hx of chemo, dystonia, Meige syndrome    Patient Stated Goals improve handwriting    Currently in Pain? No/denies           Purdue Pegboard for bilateral hand coordination/timing with min difficulty.   Flipping card between each finger with each hand with only  occasional min difficulty for incr coordination.   Standing functional step and reach (diagonal) with lateral and overhead reach, min v.c. for big movements.  Standing, using Boomwhackers to hit targets in sequences with each UE with min cueing to use large amplitude movements with wt. Shift, trunk rotation and reach.    Checked Box and blocks test--see below.  Reviewed/discussed strategies/use of PWR! Hands for eating.          OT Short Term Goals - 11/08/20 1816      OT SHORT TERM GOAL #1   Title ---             OT Long Term Goals - 12/11/20 0929      OT LONG TERM GOAL #1   Title Pt will verbalize understanding of strategies to incr ease/efficiency with ADLs/IADLs (including use of tremor compensation strategies for eating dinner, writing, getting money from wallet, getting items from floor/getting up from floor).--check LTGs 12/05/20    Time 4    Period Weeks    Status Achieved      OT LONG TERM GOAL #2   Title Pt will be independent with updated HEP    Time 4    Period Weeks  Status Achieved      OT LONG TERM GOAL #3   Title Pt will be able to write at least 3 sentences with at least 90% legibility.    Baseline 75%    Time 4    Period Weeks    Status On-going      OT LONG TERM GOAL #4   Title Pt will demo/report incr ease with seperating money in wallet.    Time 4    Period Weeks    Status Achieved      OT LONG TERM GOAL #5   Title Pt will improve LUE functional reaching as shown by improving score on box and blocks test by at least 4.    Baseline 39 blocks    Time 4    Period Weeks    Status Achieved   12/11/20:  43 blocks                Plan - 12/11/20 0835    OT Occupational Profile and History Detailed Assessment- Review of Records and additional review of physical, cognitive, psychosocial history related to current functional performance    Occupational performance deficits (Please refer to evaluation for details): ADL's;IADL's;Leisure     Body Structure / Function / Physical Skills ADL;Balance;Improper spinal/pelvic alignment;Mobility;UE functional use;FMC;Coordination;IADL;Dexterity;GMC    Cognitive Skills Memory    Rehab Potential Good    OT Frequency 2x / week    OT Duration 4 weeks    OT Treatment/Interventions Self-care/ADL training;Therapeutic exercise;Functional Mobility Training;Balance training;Manual Therapy;Neuromuscular education;Ultrasound;Aquatic Therapy;Energy conservation;Therapeutic activities;Cryotherapy;DME and/or AE instruction;Cognitive remediation/compensation;Passive range of motion;Patient/family education;Moist Heat    Plan check goals and anticipate d/c next visit    Consulted and Agree with Plan of Care Patient           Patient will benefit from skilled therapeutic intervention in order to improve the following deficits and impairments:   Body Structure / Function / Physical Skills: ADL,Balance,Improper spinal/pelvic alignment,Mobility,UE functional use,FMC,Coordination,IADL,Dexterity,GMC Cognitive Skills: Memory     Visit Diagnosis: Other symptoms and signs involving the nervous system  Other lack of coordination  Tremor  Abnormal posture  Unsteadiness on feet    Problem List Patient Active Problem List   Diagnosis Date Noted  . Healthcare maintenance 06/28/2020  . Gastroesophageal reflux disease 03/07/2020  . Localized swelling of right lower extremity 04/12/2019  . DOE (dyspnea on exertion) 07/15/2018  . 10 year risk of MI or stroke 7.5% or greater 04/12/2018  . 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 Dysphagia, neurologic 07/27/2014  . Parkinson disease ([place]) 07/27/2014  . Dystonia 07/10/2014  . Low back pain 11/21/2011  . TRANSAMINASES, SERUM, ELEVATED 07/06/2008  . DIARRHEA 05/23/2008  . LYMPH NODE-ENLARGED 07/28/2007  . Non-Hodgkin lymphoma ([place]) 03/23/2007    [facility] 12/11/2020, 12:22  PM  [facility] [address] [place], [place], [address] Phone: [phone]   Fax:  [phone]  Name: [name] [name] MRN: [id number] Date of Birth: [birth year]

## 2020-12-13 ENCOUNTER — Ambulatory Visit: Payer: Medicare Other | Admitting: Physical Therapy

## 2020-12-13 ENCOUNTER — Encounter: Payer: Self-pay | Admitting: Occupational Therapy

## 2020-12-13 ENCOUNTER — Ambulatory Visit: Payer: Medicare Other | Admitting: Occupational Therapy

## 2020-12-13 ENCOUNTER — Other Ambulatory Visit: Payer: Self-pay

## 2020-12-13 DIAGNOSIS — R29818 Other symptoms and signs involving the nervous system: Secondary | ICD-10-CM

## 2020-12-13 DIAGNOSIS — R251 Tremor, unspecified: Secondary | ICD-10-CM

## 2020-12-13 DIAGNOSIS — R278 Other lack of coordination: Secondary | ICD-10-CM

## 2020-12-13 DIAGNOSIS — R293 Abnormal posture: Secondary | ICD-10-CM | POA: Diagnosis not present

## 2020-12-13 DIAGNOSIS — R2689 Other abnormalities of gait and mobility: Secondary | ICD-10-CM | POA: Diagnosis not present

## 2020-12-13 DIAGNOSIS — R2681 Unsteadiness on feet: Secondary | ICD-10-CM | POA: Diagnosis not present

## 2020-12-13 NOTE — Patient Instructions (Signed)
Optimal Fitness Program after Therapy for People with Parkinson's Disease  1)  Therapy Home Exercise Program  -Do these Exercises DAILY as instructed by your therapist  -Big, deliberate effort with exercises  -These exercises are important to perform consistently, even when therapist has  finished, because these therapy exercises often address your specific  Parkinson's difficulties   2)  Walking  -  Work up to walking 3-5 times per week, 20-30 minutes per day  -This can be done at home, driveway, quiet street or an indoor track  -Focus should be on your Best posture, arm swing, step length for your best walking pattern  3)  Aerobic Exercise  -Work up to 3-5 times per week, 30 minutes per day  -This can be stationary bike, seated stepper machine, elliptical machine  -Work up to 7-8/10 intensity during the exercise, at minimal to moderate     Resistance

## 2020-12-13 NOTE — Therapy (Signed)
[facility] [address] [place], [place], [address] Phone: [phone]   Fax:  [phone]  Physical Therapy Treatment/Discharge Summary  Patient Details  Name: [name] [name] MRN: [id number] Date of Birth: Aug 30, 1951 Referring Provider (PT): Wells Guiles, Tat DO   PHYSICAL THERAPY DISCHARGE SUMMARY  Visits from Start of Care: 9  Current functional level related to goals / functional outcomes:  PT Long Term Goals - 12/13/20 0951      PT LONG TERM GOAL #1   Title Pt will be independent with HEP and community fitness plans upon d/c from PT for continuing to maximize gains made in PT.  TARGET 12/07/2020    Baseline Independent, per participation in [place]! Moves class 3/23 and no questions today    Time 4    Period Weeks    Status Achieved      PT LONG TERM GOAL #2   Title Pt will improve gait velocity to at least 4 ft/sec for improved gait efficiency and safety in the community.    Baseline 4.47 ft/sec    Time 4    Period Weeks    Status Achieved      PT LONG TERM GOAL #3   Title Pt will perform posterior push and release test in 2 steps or less, for improved posterior balance recovery.    Time 4    Period Weeks    Status Achieved      PT LONG TERM GOAL #4   Title Pt will negotiate at least 4 steps, 4 reps, with appropriate foot clearance with ascending and descending stairs.    Time 4    Period Weeks    Status Achieved          Pt has met all 4 LTGs.   Remaining deficits: Posture, high level balance/gait (improved)   Education / Equipment: HEP, optimal fitness program post d/c.  Plan: Patient agrees to discharge.  Patient goals were met. Patient is being discharged due to meeting the stated rehab goals.  ?????Recommend return PT eval in 6-9 months due to progressive nature of the disease process.        Encounter Date: 12/13/2020   PT End of Session - 12/13/20 0928    Visit Number 9    Number of  Visits 9    Date for PT Re-Evaluation 01/07/21    Authorization Type Medicare/Aetna    PT Start Time 0849    PT Stop Time 0928    PT Time Calculation (min) 39 min    Activity Tolerance Patient tolerated treatment well   Slight increase in pain to 3/10 at end of session.   Behavior During Therapy [facility] for tasks assessed/performed           Past Medical History:  Diagnosis Date  . Cervical lymphadenopathy    right - followed my ENT  . Dystonia 1994   diagnosed in [place]  . Non Hodgkin's lymphoma ([place]) 2007   diffuse- 6 cycles of chemo with R CHOP Rituxan - last dose 10/08  . Parkinson disease ([place])    2015    Past Surgical History:  Procedure Laterality Date  . CHOLECYSTECTOMY, LAPAROSCOPIC    . EYE SURGERY     x2 as child  . PORT-A-CATH REMOVAL    . PORTACATH PLACEMENT    . SPLENECTOMY    . TONSILLECTOMY      There were no vitals filed for this visit.   Subjective Assessment - 12/13/20 1157  Subjective A little tired from the exercise class yesterday, but overall it was good.    Pertinent History non-hodgkins lymphoma, PD (diagnosed in 2015), cervical lymphadenopathy    Patient Stated Goals No specific goals-main thing I want is for the class (PWR! Moves) to get going again.    Currently in Pain? No/denies    Pain Onset In the past 7 days                             [facility] Adult PT Treatment/Exercise - 12/13/20 0001      Transfers   Transfers Sit to Stand;Stand to Sit    Sit to Stand 6: Modified independent (Device/Increase time);Without upper extremity assist;From chair/3-in-1    Five time sit to stand comments  9.47    Stand to Sit 6: Modified independent (Device/Increase time);Without upper extremity assist;To chair/3-in-1      Ambulation/Gait   Ambulation/Gait Yes    Ambulation/Gait Assistance 6: Modified independent (Device/Increase time)    Ambulation Distance (Feet) 600 Feet    Assistive device None    Gait Pattern Step-through  pattern;Decreased trunk rotation;Trunk flexed    Ambulation Surface Level;Indoor      Timed Up and Go Test   TUG Normal TUG    Normal TUG (seconds) 8.82      High Level Balance   High Level Balance Activities Other (comment)    High Level Balance Comments Push and release tests:  forward:  pt takes one step to regain balance; backward:  takes one step to regain balance (performed x 3 reps); side L and R:  pt takes 1 step to regain balance.      Self-Care   Self-Care Other Self-Care Comments    Other Self-Care Comments  Discussed optimal fitness program/routine following d/c from PT, including HEP from therapy, aerobic activity and walking for exercise.  Discussed intensity of aerobic activity, and provided pt with information about Parkinson's cycling classes at [facility].  He is interested in pursuing at [facility].  Discussed progress towards goals, POC, and return eval in 6 months.  He is in agreement with d/c today.      Knee/Hip Exercises: Aerobic   Stepper Seated SciFit Stepper, Level 2.2, 4 extremities 8 minutes, cues to keep RPM >80, for increased intensity of motion and aerobic activity.  Rates effort as 5/10 level                  PT Education - 12/13/20 0949    Education Details Optimal PD fitness program, PD resource information, plans for d/c from PT and return PT eval.    Person(s) Educated Patient    Methods Explanation;Handout    Comprehension Verbalized understanding            PT Short Term Goals - 01/05/20 1225      PT SHORT TERM GOAL #1   Title ALL STGS = LTGS             PT Long Term Goals - 12/13/20 0951      PT LONG TERM GOAL #1   Title Pt will be independent with HEP and community fitness plans upon d/c from PT for continuing to maximize gains made in PT.  TARGET 12/07/2020    Baseline Independent, per participation in [place] Moves class 3/23 and no questions today    Time 4    Period Weeks    Status Achieved  PT LONG TERM GOAL #2   Title  Pt will improve gait velocity to at least 4 ft/sec for improved gait efficiency and safety in the community.    Baseline 4.47 ft/sec    Time 4    Period Weeks    Status Achieved      PT LONG TERM GOAL #3   Title Pt will perform posterior push and release test in 2 steps or less, for improved posterior balance recovery.    Time 4    Period Weeks    Status Achieved      PT LONG TERM GOAL #4   Title Pt will negotiate at least 4 steps, 4 reps, with appropriate foot clearance with ascending and descending stairs.    Time 4    Period Weeks    Status Achieved                 Plan - 12/13/20 0951    Clinical Impression Statement Checked remaining LTGs this visit, with pt meeting remaining goals.  Pt has met all 4 LTGs.  He verbalizes and demo understanding of HEP and fitness information.  He is approrpiate for d/c at this time.    Personal Factors and Comorbidities Comorbidity 3+    Comorbidities non-hodgkins lymphoma, PD (diagnosed in 2015), cervical lymphadenopathy    Examination-Activity Limitations Stairs;Locomotion Level    Examination-Participation Restrictions Community Activity;Other   Exercise classes   Stability/Clinical Decision Making Evolving/Moderate complexity    Rehab Potential Good    PT Frequency 2x / week    PT Duration 4 weeks   plus eval   PT Treatment/Interventions ADLs/Self Care Home Management;Gait training;Stair training;Functional mobility training;Therapeutic activities;Patient/family education;Neuromuscular re-education;Balance training;Therapeutic exercise    PT Next Visit Plan D/C this visit, with return PT/OT/speech eval in 6 months.    Consulted and Agree with Plan of Care Patient           Patient will benefit from skilled therapeutic intervention in order to improve the following deficits and impairments:  Abnormal gait,Decreased balance,Difficulty walking,Decreased strength,Postural dysfunction  Visit Diagnosis: Other abnormalities of gait  and mobility  Unsteadiness on feet     Problem List Patient Active Problem List   Diagnosis Date Noted  . Healthcare maintenance 06/28/2020  . Gastroesophageal reflux disease 03/07/2020  . Localized swelling of right lower extremity 04/12/2019  . DOE (dyspnea on exertion) 07/15/2018  . 10 year risk of MI or stroke 7.5% or greater 04/12/2018  . 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 Dysphagia, neurologic 07/27/2014  . Parkinson disease ([place]) 07/27/2014  . Dystonia 07/10/2014  . Low back pain 11/21/2011  . TRANSAMINASES, SERUM, ELEVATED 07/06/2008  . DIARRHEA 05/23/2008  . LYMPH NODE-ENLARGED 07/28/2007  . Non-Hodgkin lymphoma ([place]) 03/23/2007    [name] [name] W. 12/13/2020, 9:54 AM Mady Haagensen, PT 12/13/20 9:56 AM Phone: [phone] Fax: [place] [address] [place], [place], [address] Phone: [phone]   Fax:  [phone]  Name: [name] [name] MRN: [id number] Date of Birth: 09/17/1951

## 2020-12-13 NOTE — Therapy (Signed)
[facility] [address] [place], [place], [address] Phone: [phone]   Fax:  [phone]  Occupational Therapy Treatment  Patient Details  Name: [name] [name] MRN: [id number] Date of Birth: April 02, 1951 Referring Provider (OT): Dr. Wells Guiles Tat   Encounter Date: 12/13/2020   OT End of Session - 12/13/20 0810    Visit Number 9    Number of Visits 9    Date for OT Re-Evaluation 12/08/20    Authorization Type Medicare & Aetna--Covered at 100%    Authorization - Visit Number 9    Authorization - Number of Visits 10    Progress Note Due on Visit 10    OT Start Time 0806    OT Stop Time 0845    OT Time Calculation (min) 39 min    Activity Tolerance Patient tolerated treatment well    Behavior During Therapy [facility] for tasks assessed/performed           Past Medical History:  Diagnosis Date  . Cervical lymphadenopathy    right - followed my ENT  . Dystonia 1994   diagnosed in [place]  . Non Hodgkin's lymphoma ([place]) 2007   diffuse- 6 cycles of chemo with R CHOP Rituxan - last dose 10/08  . Parkinson disease ([place])    2015    Past Surgical History:  Procedure Laterality Date  . CHOLECYSTECTOMY, LAPAROSCOPIC    . EYE SURGERY     x2 as child  . PORT-A-CATH REMOVAL    . PORTACATH PLACEMENT    . SPLENECTOMY    . TONSILLECTOMY      There were no vitals filed for this visit.   Subjective Assessment - 12/13/20 0808    Pertinent History Parkinson's Disease.   PMH:  non-hodgkin lymphoma with hx of chemo, dystonia, Meige syndrome    Patient Stated Goals improve handwriting    Currently in Pain? No/denies           Standing, functional step and reach to flip large cards (diagonal pattern) with min cueing for incr movement amplitude.  Placing grooved pegs in pegboard with each hand with min difficulty.   Practiced writing on 1/2" and 1/4" graph paper with good size/legibility, occasional min difficulty with  spacing.  Then on lined paper with good legibility and size when writing in all caps.  Dealing cards with thumb with min cueing for incr movement amplitude, particularly with [name].  Checked remaining goals and discussed progress and follow-up.  Pt has also started PWR! Moves exercise class.            OT Short Term Goals - 11/08/20 1816      OT SHORT TERM GOAL #1   Title ---             OT Long Term Goals - 12/13/20 0813      OT LONG TERM GOAL #1   Title Pt will verbalize understanding of strategies to incr ease/efficiency with ADLs/IADLs (including use of tremor compensation strategies for eating dinner, writing, getting money from wallet, getting items from floor/getting up from floor).--check LTGs 12/05/20    Time 4    Period Weeks    Status Achieved      OT LONG TERM GOAL #2   Title Pt will be independent with updated HEP    Time 4    Period Weeks    Status Achieved      OT LONG TERM GOAL #3   Title Pt will be able  to write at least 3 sentences with at least 90% legibility.    Baseline 75%    Time 4    Period Weeks    Status Achieved   12/13/20     OT LONG TERM GOAL #4   Title Pt will demo/report incr ease with seperating money in wallet.    Time 4    Period Weeks    Status Achieved      OT LONG TERM GOAL #5   Title Pt will improve LUE functional reaching as shown by improving score on box and blocks test by at least 4.    Baseline 39 blocks    Time 4    Period Weeks    Status Achieved   12/11/20:  43 blocks                Plan - 12/13/20 0812    Clinical Impression Statement Pt has made good progress with all goals met.    OT Occupational Profile and History Detailed Assessment- Review of Records and additional review of physical, cognitive, psychosocial history related to current functional performance    Occupational performance deficits (Please refer to evaluation for details): ADL's;IADL's;Leisure    Body Structure / Function / Physical  Skills ADL;Balance;Improper spinal/pelvic alignment;Mobility;UE functional use;FMC;Coordination;IADL;Dexterity;GMC    Cognitive Skills Memory    Rehab Potential Good    OT Frequency 2x / week    OT Duration 4 weeks    OT Treatment/Interventions Self-care/ADL training;Therapeutic exercise;Functional Mobility Training;Balance training;Manual Therapy;Neuromuscular education;Ultrasound;Aquatic Therapy;Energy conservation;Therapeutic activities;Cryotherapy;DME and/or AE instruction;Cognitive remediation/compensation;Passive range of motion;Patient/family education;Moist Heat    Plan d/c, schedule follow-up eval/screen in approx 6-8 months    Consulted and Agree with Plan of Care Patient           Patient will benefit from skilled therapeutic intervention in order to improve the following deficits and impairments:   Body Structure / Function / Physical Skills: ADL,Balance,Improper spinal/pelvic alignment,Mobility,UE functional use,FMC,Coordination,IADL,Dexterity,GMC Cognitive Skills: Memory     Visit Diagnosis: Other symptoms and signs involving the nervous system  Other lack of coordination  Tremor  Abnormal posture  Unsteadiness on feet  Other abnormalities of gait and mobility    Problem List Patient Active Problem List   Diagnosis Date Noted  . Healthcare maintenance 06/28/2020  . Gastroesophageal reflux disease 03/07/2020  . Localized swelling of right lower extremity 04/12/2019  . DOE (dyspnea on exertion) 07/15/2018  . 10 year risk of MI or stroke 7.5% or greater 04/12/2018  . 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 Dysphagia, neurologic 07/27/2014  . Parkinson disease ([place]) 07/27/2014  . Dystonia 07/10/2014  . Low back pain 11/21/2011  . TRANSAMINASES, SERUM, ELEVATED 07/06/2008  . DIARRHEA 05/23/2008  . LYMPH NODE-ENLARGED 07/28/2007  . Non-Hodgkin lymphoma ([place]) 03/23/2007    OCCUPATIONAL THERAPY  DISCHARGE SUMMARY  Visits from Start of Care: 9  Current functional level related to goals / functional outcomes: See above   Remaining deficits: Bradykinesia/hypokinesia, timing deficits, decr coordination, abnormal posture, decr balance/functional mobility, Tremors--improved and is successfully using strategies for this during ADLs   Education / Equipment: Pt was instructed in the following:  PD-specific HEP, adaptive strategies for ADLs/IADLs, ways to prevent future complications.  Pt verbalized understanding of all education provided.   Plan: Patient agrees to discharge.  Patient goals were  met. Patient is being discharged due to being pleased with the current functional level.  Pt would benefit from occupational therapy evaluation/screen in approx  6-8 months to assess for need for further therapy/functional changes due to progressive nature of diagnosis.     [facility] 12/13/2020, 8:30 AM  [facility] [address] [place], [place], [address] Phone: [phone]   Fax:  [phone]  Name: [name] [name] MRN: [id number] Date of Birth: 05-18-1951   Vianne Bulls, OTR/L [facility] [address] [place],   [address] [phone] phone [phone] 12/13/20 8:31 AM

## 2021-01-22 ENCOUNTER — Other Ambulatory Visit: Payer: Self-pay | Admitting: Neurology

## 2021-02-24 ENCOUNTER — Other Ambulatory Visit: Payer: Self-pay | Admitting: Neurology

## 2021-02-25 NOTE — Telephone Encounter (Signed)
Enough given till August 4,2022 with Wells Guiles Tat

## 2021-04-05 ENCOUNTER — Other Ambulatory Visit: Payer: Self-pay | Admitting: Neurology

## 2021-04-10 ENCOUNTER — Other Ambulatory Visit: Payer: Self-pay | Admitting: Neurology

## 2021-04-11 ENCOUNTER — Other Ambulatory Visit: Payer: Self-pay | Admitting: Neurology

## 2021-04-12 NOTE — Telephone Encounter (Signed)
Patient's wife Loletha Carrow called today and said she the current prescription for carbidopa levodopa was changed by Dr. Carles Collet to 1 tablet 4 times a day at the last visit but the prescription was not updated.  Patient will be out of the medicine on Monday, 04/15/21.

## 2021-04-23 NOTE — Progress Notes (Signed)
Assessment/Plan:   1.  Parkinsons Disease  -Continue carbidopa/levodopa 25/100, 1 tablet at 7 AM/11 AM/3 PM/7 PM  -Continue pramipexole, 0.75 mg 3 times per day  -increase exercise  -We discussed that it used to be thought that levodopa would increase risk of melanoma but now it is believed that Parkinsons itself likely increases risk of melanoma. he is to get regular skin checks.  2.  Meige syndrome  -Stable  -no tx needed right now  3.  Insomnia/mild RBD  -Continue clonazepam, 0.5 mg, 1/2 tablet at night.  Higher dosages didn't help and had hang over effect   Subjective:   [name] [name] was seen today in follow up for Parkinsons disease.  My previous records were reviewed prior to todays visit as well as outside records available to me. Wife with patient and supplements history.  We slightly increased his levodopa last visit and he reports that he is shaking less and doing well.  No hallucinations.  No falls.  Has been to physical therapy since our last visit (in March) and those notes are reviewed.  Current prescribed movement disorder medications: Pramipexole, 0.75 mg 3 times daily Carbidopa/levodopa 25/100, 1 tablet at 7 AM/11 AM/3 PM/7 PM (increased by 1 pill last visit) clonazepam, 0.5 mg, 1 tablet at bedtime    ALLERGIES:  No Known Allergies  CURRENT MEDICATIONS:  Outpatient Encounter Medications as of 04/25/2021  Medication Sig   aspirin 81 MG chewable tablet Chew 81 mg by mouth daily.   carbidopa-levodopa (SINEMET IR) 25-100 MG tablet TAKE 1 TABLET BY MOUTH THREE TIMES A DAY (Patient taking differently: 4 (four) times daily.)   Cholecalciferol (VITAMIN D3) 3000 UNITS TABS Take 1,000 Units by mouth daily.    clonazePAM (KLONOPIN) 0.5 MG tablet Take 0.5 tablets (0.25 mg total) by mouth at bedtime.   cyanocobalamin 100 MCG tablet Take 1,000 mcg by mouth daily.    famotidine (PEPCID) 10 MG tablet Take 10 mg by mouth 2 (two) times daily.   Melatonin 3 MG TABS Take 3  mg by mouth at bedtime.    pramipexole (MIRAPEX) 0.75 MG tablet TAKE 1 TABLET BY MOUTH THREE TIMES A DAY   COVID-19 mRNA vaccine, Pfizer, 30 MCG/0.3ML injection INJECT AS DIRECTED (Patient not taking: Reported on 04/25/2021)   No facility-administered encounter medications on file as of 04/25/2021.    Objective:   PHYSICAL EXAMINATION:    VITALS:   Vitals:   04/25/21 0805  BP: 122/68  Pulse: 69  Resp: 18  SpO2: 96%  Weight: 209 lb (94.8 kg)  Height: '5\' 6"'$  (1.676 m)     GEN:  The patient appears stated age and is in NAD. HEENT:  Normocephalic, atraumatic.  The mucous membranes are moist.    Neurological examination:  Orientation: The patient is alert and oriented x3. Cranial nerves: There is good facial symmetry with facial hypomimia. The speech is fluent and pseudobulbar. Soft palate rises symmetrically and there is no tongue deviation. Hearing is intact to conversational tone. Sensation: Sensation is intact to light touch throughout Motor: Strength is at least antigravity x4.  Movement examination: Tone: There is nl tone in the ue/le Abnormal movements: none Coordination:  There is no decremation with RAM's, with any form of RAMS, including alternating supination and pronation of the forearm, hand opening and closing, finger taps, heel taps and toe taps. Gait and Station: The patient has no difficulty arising out of a deep-seated chair without the use of the hands. The patient's  stride length is slightly decreased with pisa syndrome to the R.  He is slow to ambulate.  I have reviewed and interpreted the following labs independently    Chemistry      Component Value Date/Time   NA 140 04/12/2018 0807   K 4.5 04/12/2018 0807   CL 104 04/12/2018 0807   CO2 29 04/12/2018 0807   BUN 16 04/12/2018 0807   CREATININE 1.09 04/12/2018 0807   CREATININE 0.94 09/26/2014 0925      Component Value Date/Time   CALCIUM 10.1 04/12/2018 0807   ALKPHOS 57 04/12/2018 0807   AST 21  04/12/2018 0807   ALT 35 04/12/2018 0807   BILITOT 0.8 04/12/2018 0807       Lab Results  Component Value Date   WBC 9.1 04/12/2018   HGB 15.5 04/12/2018   HCT 45.4 04/12/2018   MCV 98.0 04/12/2018   PLT 307.0 04/12/2018    Lab Results  Component Value Date   TSH 1.67 03/27/2015     Total time spent on today's visit was 20  minutes, including both face-to-face time and nonface-to-face time.  Time included that spent on review of records (prior notes available to me/labs/imaging if pertinent), discussing treatment and goals, answering patient's questions and coordinating care.  Cc:  [name], DO

## 2021-04-25 ENCOUNTER — Other Ambulatory Visit: Payer: Self-pay

## 2021-04-25 ENCOUNTER — Encounter: Payer: Self-pay | Admitting: Neurology

## 2021-04-25 ENCOUNTER — Ambulatory Visit (INDEPENDENT_AMBULATORY_CARE_PROVIDER_SITE_OTHER): Payer: Medicare Other | Admitting: Neurology

## 2021-04-25 VITALS — BP 122/68 | HR 69 | Resp 18 | Ht 66.0 in | Wt 209.0 lb

## 2021-04-25 DIAGNOSIS — G20 Parkinson's disease: Secondary | ICD-10-CM | POA: Diagnosis not present

## 2021-04-25 DIAGNOSIS — G244 Idiopathic orofacial dystonia: Secondary | ICD-10-CM

## 2021-04-25 MED ORDER — PRAMIPEXOLE DIHYDROCHLORIDE 0.75 MG PO TABS: 0.7500 mg | ORAL_TABLET | Freq: Three times a day (TID) | ORAL | 2 refills | Status: DC

## 2021-04-25 MED ORDER — CARBIDOPA-LEVODOPA 25-100 MG PO TABS: 1.0000 | ORAL_TABLET | Freq: Four times a day (QID) | ORAL | 2 refills | Status: DC

## 2021-04-25 NOTE — Patient Instructions (Signed)
As we discussed, it used to be thought that levodopa would increase risk of melanoma but now it is believed that Parkinsons itself likely increases risk of melanoma. I recommend yearly skin checks with a board certified dermatologist.  You can call [facility] or [facility] for an appointment. ° °[facility] °Address: [address], [place], [place] [address] °Phone: [phone] ° °[facility] °[address], [place], [place] °Phone: [phone] ° °

## 2021-07-08 ENCOUNTER — Other Ambulatory Visit: Payer: Self-pay | Admitting: Neurology

## 2021-07-16 ENCOUNTER — Ambulatory Visit: Payer: Medicare Other | Admitting: Occupational Therapy

## 2021-07-16 ENCOUNTER — Other Ambulatory Visit: Payer: Self-pay

## 2021-07-16 ENCOUNTER — Telehealth: Payer: Self-pay | Admitting: Physical Therapy

## 2021-07-16 ENCOUNTER — Ambulatory Visit: Payer: Medicare Other | Attending: Psychiatry | Admitting: Physical Therapy

## 2021-07-16 ENCOUNTER — Ambulatory Visit: Payer: Medicare Other

## 2021-07-16 DIAGNOSIS — R29818 Other symptoms and signs involving the nervous system: Secondary | ICD-10-CM | POA: Insufficient documentation

## 2021-07-16 DIAGNOSIS — R471 Dysarthria and anarthria: Secondary | ICD-10-CM

## 2021-07-16 DIAGNOSIS — G20 Parkinson's disease: Secondary | ICD-10-CM

## 2021-07-16 NOTE — Therapy (Signed)
[facility] [address] [place], [place], [address] Phone: [phone]   Fax:  [phone]  Patient Details  Name: [name] [name] MRN: [id number] Date of Birth: Sep 28, 1950 Referring Provider:  [name]*  Encounter Date: 07/16/2021  Physical Therapy Parkinson's Disease Screen   Timed Up and Go test:7.71  10 meter walk test:8.72 seconds = 3.76 ft/sec  5 time sit to stand test:10.03 seconds    Pt reports that he feels like he is getting worse and slower- feet feel like they are sticking more and is leaning more to the right. Getting up from a soft chair is challenging. No falls. Going to the [facility] moves class 1x a week.   Patient would benefit from Physical Therapy evaluation due to slowed measures and pt not moving as well as he used to.  Pt would prefer to have evals at [facility] neuro location.    Arliss Journey, PT, DPT 07/16/2021, 1:39 PM  [facility] [address] [place], [place], [address] Phone: [phone]   Fax:  [phone]

## 2021-07-16 NOTE — Therapy (Signed)
[facility] [address] [place], [place], [address] Phone: [phone]   Fax:  [phone]  Patient Details  Name: [name] [name] MRN: [id number] Date of Birth: 23-Jul-1951 Referring Provider:  [name]*  Encounter Date: 07/16/2021   Occupational Therapy Parkinson's Disease Screen  Hand dominance:  right    Physical Performance Test item #4 (donning/doffing jacket):  9.78 sec  Fastening/unfastening 3 buttons in:  40.50sec  9-hole peg test:    [name]  28.41 sec        LUE  27.03 sec  Change in ability to perform ADLs/IADLs:  no per pt    Other Comments:  Pt reports that he wants to focus on PT and ST at this time.  Pt does not require occupational therapy services at this time.  Recommended occupational therapy screen in   6-44months     [facility] 07/16/2021, 1:24 PM  [facility] [address] [place], [place], [address] Phone: [phone]   Fax:  [place], OTR/L [facility] [address] [place], [place]  [address] [phone] phone [phone] 07/16/21 2:36 PM

## 2021-07-16 NOTE — Therapy (Signed)
[facility] [address] [place], [place], [address] Phone: [phone]   Fax:  [phone]  Patient Details  Name: [name] [name] MRN: [id number] Date of Birth: [birth year] Referring Provider:  Alonza Bogus DO  Encounter Date: 07/16/2021  Speech Therapy Parkinson's Disease Screen   Decibel Level today: low to mid 60s dB  (WNL=70-72 dB) with sound level meter 30cm away from pt's masked mouth. Pt's conversational volume has declined since last treatment course. 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   Pt does not report difficulty with swallowing warranting further evaluation.  Pt would benefit from speech-language eval for dysarthria - please order via Epic.  Alinda Deem, MA CCC-SLP 07/16/2021, 1:35 PM  [facility] [address] [place], [place], [address] Phone: [phone]   Fax:  [phone]

## 2021-07-16 NOTE — Telephone Encounter (Signed)
Dr. Carles Collet, [name] [name] was seen for Parkinsons OT, PT, and ST screens on 07/16/21. The patient would benefit from a PT referral (due to slowed mobility measures) and a speech therapy referral (due to decr voice volume).   If you agree, please place an order in [facility] workque in [facility] or fax the order to [phone].  Pt prefers to be at the Outpatient Neuro [facility] location.  Thank you, Janann August, PT, DPT 07/16/21 2:06 PM    [facility] [address] [place], [place]  [address] Phone:  [phone] Fax:  [phone]

## 2021-07-16 NOTE — Telephone Encounter (Signed)
Orders have been put in for PT and Speech Therapy at [facility] location for this patient

## 2021-07-21 ENCOUNTER — Telehealth: Payer: Medicare Other | Admitting: Emergency Medicine

## 2021-07-21 DIAGNOSIS — R6889 Other general symptoms and signs: Secondary | ICD-10-CM | POA: Diagnosis not present

## 2021-07-21 MED ORDER — OSELTAMIVIR PHOSPHATE 75 MG PO CAPS: 75.0000 mg | ORAL_CAPSULE | Freq: Two times a day (BID) | ORAL | 0 refills | Status: AC

## 2021-07-21 NOTE — Progress Notes (Signed)
I have spent 5 minutes in review of e-visit questionnaire, review and updating patient chart, medical decision making and response to patient.   Dannon Perlow, PA-C    

## 2021-07-21 NOTE — Progress Notes (Signed)
E visit for Flu like symptoms   We are sorry that you are not feeling well.  Here is how we plan to help! Based on what you have shared with me it looks like you may have a respiratory virus that may be influenza.  Influenza or "the flu" is   an infection caused by a respiratory virus. The flu virus is highly contagious and persons who did not receive their yearly flu vaccination may "catch" the flu from close contact.  We have anti-viral medications to treat the viruses that cause this infection. They are not a "cure" and only shorten the course of the infection. These prescriptions are most effective when they are given within the first 2 days of "flu" symptoms. Antiviral medication are indicated if you have a high risk of complications from the flu. You should  also consider an antiviral medication if you are in close contact with someone who is at risk. These medications can help patients avoid complications from the flu  but have side effects that you should know. Possible side effects from Tamiflu or oseltamivir include nausea, vomiting, diarrhea, dizziness, headaches, eye redness, sleep problems or other respiratory symptoms. You should not take Tamiflu if you have an allergy to oseltamivir or any to the ingredients in Tamiflu.  Based upon your symptoms and potential risk factors I have prescribed Oseltamivir (Tamiflu).  It has been sent to your designated pharmacy.  You will take one 75 mg capsule orally twice a day for the next 5 days.  Please follow up with PCP by phone or in person in the next day or two to ensure symptoms are improving.  If you do not have improvement in symptoms, or experience persistent fever, productive cough, vomiting, diarrhea, shortness of breath, chest pain, then seek in person evaluation immediately either at urgent care or the hospital.  If you are unable to drive due to your symptoms call 911 immediately.    ANYONE WHO HAS FLU SYMPTOMS SHOULD: Stay home. The flu  is highly contagious and going out or to work exposes others! Be sure to drink plenty of fluids. Water is fine as well as fruit juices, sodas and electrolyte beverages. You may want to stay away from caffeine or alcohol. If you are nauseated, try taking small sips of liquids. How do you know if you are getting enough fluid? Your urine should be a pale yellow or almost colorless. Get rest. Taking a steamy shower or using a humidifier may help nasal congestion and ease sore throat pain. Using a saline nasal spray works much the same way. Cough drops, hard candies and sore throat lozenges may ease your cough. Line up a caregiver. Have someone check on you regularly.   GET HELP RIGHT AWAY IF: You cannot keep down liquids or your medications. You become short of breath Your fell like you are going to pass out or loose consciousness. Your symptoms persist after you have completed your treatment plan MAKE SURE YOU  Understand these instructions. Will watch your condition. Will get help right away if you are not doing well or get worse.  Your e-visit answers were reviewed by a board certified advanced clinical practitioner to complete your personal care plan.  Depending on the condition, your plan could have included both over the counter or prescription medications.  If there is a problem please reply  once you have received a response from your provider.  Your safety is important to [place].  If you have drug  allergies check your prescription carefully.    You can use MyChart to ask questions about today's visit, request a non-urgent call back, or ask for a work or school excuse for 24 hours related to this e-Visit. If it has been greater than 24 hours you will need to follow up with your provider, or enter a new e-Visit to address those concerns.  You will get an e-mail in the next two days asking about your experience.  I hope that your e-visit has been valuable and will speed your recovery. Thank  you for using e-visits.

## 2021-07-30 ENCOUNTER — Ambulatory Visit: Payer: Medicare Other

## 2021-07-30 ENCOUNTER — Ambulatory Visit: Payer: Medicare Other | Admitting: Physical Therapy

## 2021-08-05 ENCOUNTER — Other Ambulatory Visit: Payer: Self-pay

## 2021-08-05 ENCOUNTER — Encounter: Payer: Self-pay | Admitting: Family Medicine

## 2021-08-05 ENCOUNTER — Ambulatory Visit (INDEPENDENT_AMBULATORY_CARE_PROVIDER_SITE_OTHER): Payer: Medicare Other | Admitting: Family Medicine

## 2021-08-05 VITALS — BP 120/76 | HR 86 | Temp 98.0°F | Ht 66.0 in | Wt 208.2 lb

## 2021-08-05 DIAGNOSIS — Z0000 Encounter for general adult medical examination without abnormal findings: Secondary | ICD-10-CM | POA: Diagnosis not present

## 2021-08-05 DIAGNOSIS — M25532 Pain in left wrist: Secondary | ICD-10-CM

## 2021-08-05 DIAGNOSIS — M25511 Pain in right shoulder: Secondary | ICD-10-CM | POA: Diagnosis not present

## 2021-08-05 DIAGNOSIS — Z23 Encounter for immunization: Secondary | ICD-10-CM

## 2021-08-05 DIAGNOSIS — Z1159 Encounter for screening for other viral diseases: Secondary | ICD-10-CM

## 2021-08-05 DIAGNOSIS — G8929 Other chronic pain: Secondary | ICD-10-CM

## 2021-08-05 DIAGNOSIS — E7800 Pure hypercholesterolemia, unspecified: Secondary | ICD-10-CM | POA: Diagnosis not present

## 2021-08-05 DIAGNOSIS — H6123 Impacted cerumen, bilateral: Secondary | ICD-10-CM | POA: Diagnosis not present

## 2021-08-05 NOTE — Patient Instructions (Addendum)
Give [place] 2-3 business days to get the results of your labs back.   The new Shingrix vaccine (for shingles) is a 2 shot series. It can make people feel low energy, achy and almost like they have the flu for 48 hours after injection. Please plan accordingly when deciding on when to get this shot. Call your pharmacy to get this. The second shot of the series is less severe regarding the side effects, but it still lasts 48 hours.   I recommend getting the updated bivalent covid vaccination booster at your convenience.   Keep the diet clean and stay active.  OK to use Debrox (peroxide) in the ear to loosen up wax. Also recommend using a bulb syringe (for removing boogers from baby's noses) to flush through warm water and vinegar (3-4:1 ratio). An alternative, though more expensive, is an elephant ear washer wax removal kit. Do not use Q-tips as this can impact wax further.  Heat (pad or rice pillow in microwave) over affected area, 10-15 minutes twice daily.   OK to take Tylenol 1000 mg (2 extra strength tabs) or 975 mg (3 regular strength tabs) every 6 hours as needed.  Ice/cold pack over area for 10-15 min twice daily.  Consider getting a thumb spica splint for the L thumb. Wear at night and during aggravating activities.   Let us know if you need anything.  Hand Exercises Hand exercises can be helpful for almost anyone. These exercises can strengthen the hands, improve flexibility and movement, and increase blood flow to the hands. These results can make work and daily tasks easier. Hand exercises can be especially helpful for people who have joint pain from arthritis or have nerve damage from overuse (carpal tunnel syndrome). These exercises can also help people who have injured a hand. Exercises Most of these hand exercises are gentle stretching and motion exercises. It is usually safe to do them often throughout the day. Warming up your hands before exercise may help to reduce stiffness. You  can do this with gentle massage or by placing your hands in warm water for 10-15 minutes. It is normal to feel some stretching, pulling, tightness, or mild discomfort as you begin new exercises. This will gradually improve. Stop an exercise right away if you feel sudden, severe pain or your pain gets worse. Ask your health care provider which exercises are best for you. Knuckle bend or "claw" fist Stand or sit with your arm, hand, and all five fingers pointed straight up. Make sure to keep your wrist straight during the exercise. Gently bend your fingers down toward your palm until the tips of your fingers are touching the top of your palm. Keep your big knuckle straight and just bend the small knuckles in your fingers. Hold this position for 3 seconds. Straighten (extend) your fingers back to the starting position. Repeat this exercise 5-10 times with each hand. Full finger fist Stand or sit with your arm, hand, and all five fingers pointed straight up. Make sure to keep your wrist straight during the exercise. Gently bend your fingers into your palm until the tips of your fingers are touching the middle of your palm. Hold this position for 3 seconds. Extend your fingers back to the starting position, stretching every joint fully. Repeat this exercise 5-10 times with each hand. Straight fist Stand or sit with your arm, hand, and all five fingers pointed straight up. Make sure to keep your wrist straight during the exercise. Gently bend your fingers  at the big knuckle, where your fingers meet your hand, and the middle knuckle. Keep the knuckle at the tips of your fingers straight and try to touch the bottom of your palm. Hold this position for 3 seconds. Extend your fingers back to the starting position, stretching every joint fully. Repeat this exercise 5-10 times with each hand. Tabletop Stand or sit with your arm, hand, and all five fingers pointed straight up. Make sure to keep your wrist  straight during the exercise. Gently bend your fingers at the big knuckle, where your fingers meet your hand, as far down as you can while keeping the small knuckles in your fingers straight. Think of forming a tabletop with your fingers. Hold this position for 3 seconds. Extend your fingers back to the starting position, stretching every joint fully. Repeat this exercise 5-10 times with each hand. Finger spread Place your hand flat on a table with your palm facing down. Make sure your wrist stays straight as you do this exercise. Spread your fingers and thumb apart from each other as far as you can until you feel a gentle stretch. Hold this position for 3 seconds. Bring your fingers and thumb tight together again. Hold this position for 3 seconds. Repeat this exercise 5-10 times with each hand. Making circles Stand or sit with your arm, hand, and all five fingers pointed straight up. Make sure to keep your wrist straight during the exercise. Make a circle by touching the tip of your thumb to the tip of your index finger. Hold for 3 seconds. Then open your hand wide. Repeat this motion with your thumb and each finger on your hand. Repeat this exercise 5-10 times with each hand. Thumb motion Sit with your forearm resting on a table and your wrist straight. Your thumb should be facing up toward the ceiling. Keep your fingers relaxed as you move your thumb. Lift your thumb up as high as you can toward the ceiling. Hold for 3 seconds. Bend your thumb across your palm as far as you can, reaching the tip of your thumb for the small finger (pinkie) side of your palm. Hold for 3 seconds. Repeat this exercise 5-10 times with each hand. Grip strengthening  Hold a stress ball or other soft ball in the middle of your hand. Slowly increase the pressure, squeezing the ball as much as you can without causing pain. Think of bringing the tips of your fingers into the middle of your palm. All of your finger  joints should bend when doing this exercise. Hold your squeeze for 3 seconds, then relax. Repeat this exercise 5-10 times with each hand. Contact a health care provider if: Your hand pain or discomfort gets much worse when you do an exercise. Your hand pain or discomfort does not improve within 2 hours after you exercise. If you have any of these problems, stop doing these exercises right away. Do not do them again unless your health care provider says that you can. Get help right away if: You develop sudden, severe hand pain or swelling. If this happens, stop doing these exercises right away. Do not do them again unless your health care provider says that you can. Make sure you discuss any questions you have with your health care provider. Document Revised: 12/30/2018 Document Reviewed: 09/09/2018 Elsevier Patient Education  [place] (ROM) AND STRETCHING EXERCISES These exercises may help you when beginning to rehabilitate your injury. While completing these exercises,  remember:  Restoring tissue flexibility helps normal motion to return to the joints. This allows healthier, less painful movement and activity. An effective stretch should be held for at least 30 seconds. A stretch should never be painful. You should only feel a gentle lengthening or release in the stretched tissue.  ROM - Pendulum Bend at the waist so that your right / left arm falls away from your body. Support yourself with your opposite hand on a solid surface, such as a table or a countertop. Your right / left arm should be perpendicular to the ground. If it is not perpendicular, you need to lean over farther. Relax the muscles in your right / left arm and shoulder as much as possible. Gently sway your hips and trunk so they move your right / left arm without any use of your right / left shoulder muscles. Progress your movements so that your right / left arm moves side to side, then  forward and backward, and finally, both clockwise and counterclockwise. Complete 10-15 repetitions in each direction. Many people use this exercise to relieve discomfort in their shoulder as well as to gain range of motion. Repeat 2 times. Complete this exercise 3 times per week.  STRETCH - Flexion, Standing Stand with good posture. With an underhand grip on your right / left hand and an overhand grip on the opposite hand, grasp a broomstick or cane so that your hands are a little more than shoulder-width apart. Keeping your right / left elbow straight and shoulder muscles relaxed, push the stick with your opposite hand to raise your right / left arm in front of your body and then overhead. Raise your arm until you feel a stretch in your right / left shoulder, but before you have increased shoulder pain. Try to avoid shrugging your right / left shoulder as your arm rises by keeping your shoulder blade tucked down and toward your mid-back spine. Hold 30 seconds. Slowly return to the starting position. Repeat 2 times. Complete this exercise 3 times per week.  STRETCH - Internal Rotation Place your right / left hand behind your back, palm-up. Throw a towel or belt over your opposite shoulder. Grasp the towel/belt with your right / left hand. While keeping an upright posture, gently pull up on the towel/belt until you feel a stretch in the front of your right / left shoulder. Avoid shrugging your right / left shoulder as your arm rises by keeping your shoulder blade tucked down and toward your mid-back spine. Hold 30. Release the stretch by lowering your opposite hand. Repeat 2 times. Complete this exercise 3 times per week.  STRETCH - External Rotation and Abduction Stagger your stance through a doorframe. It does not matter which foot is forward. As instructed by your physician, physical therapist or athletic trainer, place your hands: And forearms above your head and on the door frame. And  forearms at head-height and on the door frame. At elbow-height and on the door frame. Keeping your head and chest upright and your stomach muscles tight to prevent over-extending your low-back, slowly shift your weight onto your front foot until you feel a stretch across your chest and/or in the front of your shoulders. Hold 30 seconds. Shift your weight to your back foot to release the stretch. Repeat 2 times. Complete this stretch 3 times per week.   STRENGTHENING EXERCISES  These exercises may help you when beginning to rehabilitate your injury. They may resolve your symptoms with or without further  involvement from your physician, physical therapist or athletic trainer. While completing these exercises, remember:  Muscles can gain both the endurance and the strength needed for everyday activities through controlled exercises. Complete these exercises as instructed by your physician, physical therapist or athletic trainer. Progress the resistance and repetitions only as guided. You may experience muscle soreness or fatigue, but the pain or discomfort you are trying to eliminate should never worsen during these exercises. If this pain does worsen, stop and make certain you are following the directions exactly. If the pain is still present after adjustments, discontinue the exercise until you can discuss the trouble with your clinician. If advised by your physician, during your recovery, avoid activity or exercises which involve actions that place your right / left hand or elbow above your head or behind your back or head. These positions stress the tissues which are trying to heal.  STRENGTH - Scapular Depression and Adduction With good posture, sit on a firm chair. Supported your arms in front of you with pillows, arm rests or a table top. Have your elbows in line with the sides of your body. Gently draw your shoulder blades down and toward your mid-back spine. Gradually increase the tension  without tensing the muscles along the top of your shoulders and the back of your neck. Hold for 3 seconds. Slowly release the tension and relax your muscles completely before completing the next repetition. After you have practiced this exercise, remove the arm support and complete it in standing as well as sitting. Repeat 2 times. Complete this exercise 3 times per week.   STRENGTH - External Rotators Secure a rubber exercise band/tubing to a fixed object so that it is at the same height as your right / left elbow when you are standing or sitting on a firm surface. Stand or sit so that the secured exercise band/tubing is at your side that is not injured. Bend your elbow 90 degrees. Place a folded towel or small pillow under your right / left arm so that your elbow is a few inches away from your side. Keeping the tension on the exercise band/tubing, pull it away from your body, as if pivoting on your elbow. Be sure to keep your body steady so that the movement is only coming from your shoulder rotating. Hold 3 seconds. Release the tension in a controlled manner as you return to the starting position. Repeat 2 times. Complete this exercise 3 times per week.   STRENGTH - Supraspinatus Stand or sit with good posture. Grasp a 2-3 lb weight or an exercise band/tubing so that your hand is "thumbs-up," like when you shake hands. Slowly lift your right / left hand from your thigh into the air, traveling about 30 degrees from straight out at your side. Lift your hand to shoulder height or as far as you can without increasing any shoulder pain. Initially, many people do not lift their hands above shoulder height. Avoid shrugging your right / left shoulder as your arm rises by keeping your shoulder blade tucked down and toward your mid-back spine. Hold for 3 seconds. Control the descent of your hand as you slowly return to your starting position. Repeat 2 times. Complete this exercise 3 times per week.    STRENGTH - Shoulder Extensors Secure a rubber exercise band/tubing so that it is at the height of your shoulders when you are either standing or sitting on a firm arm-less chair. With a thumbs-up grip, grasp an end of the band/tubing  in each hand. Straighten your elbows and lift your hands straight in front of you at shoulder height. Step back away from the secured end of band/tubing until it becomes tense. Squeezing your shoulder blades together, pull your hands down to the sides of your thighs. Do not allow your hands to go behind you. Hold for 3 seconds. Slowly ease the tension on the band/tubing as you reverse the directions and return to the starting position. Repeat 2 times. Complete this exercise 3 times per week.   STRENGTH - Scapular Retractors Secure a rubber exercise band/tubing so that it is at the height of your shoulders when you are either standing or sitting on a firm arm-less chair. With a palm-down grip, grasp an end of the band/tubing in each hand. Straighten your elbows and lift your hands straight in front of you at shoulder height. Step back away from the secured end of band/tubing until it becomes tense. Squeezing your shoulder blades together, draw your elbows back as you bend them. Keep your upper arm lifted away from your body throughout the exercise. Hold 3 seconds. Slowly ease the tension on the band/tubing as you reverse the directions and return to the starting position. Repeat 2 times. Complete this exercise 3 times per week.  STRENGTH - Scapular Depressors Find a sturdy chair without wheels, such as a from a dining room table. Keeping your feet on the floor, lift your bottom from the seat and lock your elbows. Keeping your elbows straight, allow gravity to pull your body weight down. Your shoulders will rise toward your ears. Raise your body against gravity by drawing your shoulder blades down your back, shortening the distance between your shoulders and ears.  Although your feet should always maintain contact with the floor, your feet should progressively support less body weight as you get stronger. Hold 3 seconds. In a controlled and slow manner, lower your body weight to begin the next repetition. Repeat 2 times. Complete this exercise 3 times per week.    This information is not intended to replace advice given to you by your health care provider. Make sure you discuss any questions you have with your health care provider.   Document Released: 07/23/2005 Document Revised: 09/29/2014 Document Reviewed: 12/21/2008 Elsevier Interactive Patient Education [employer].

## 2021-08-05 NOTE — Addendum Note (Signed)
Addended by: Sharon Seller B on: 08/05/2021 02:01 PM   Modules accepted: Orders

## 2021-08-05 NOTE — Progress Notes (Signed)
Subjective:   [name] [name] is a 70 y.o. male who presents for Medicare Annual/Subsequent preventive examination.  Ear fullness B/l ear fullness over the past 2 months. Hx of ear wax buildup. +hearing loss. No pain, drainage, fevers.   R shoulder pain- started 1 yr ago. No injr or change in activity. Pain in the front. Aching. Not improving. Nml ROM, no bruising, redness, swelling. Has not tried anything at home. No neuro s/s's.   L thumb pain- started 3 mo ago. Noticed when he was putting his socks on , no specific inj or change in activity. Sharp pain. Not improving. No bruising, swelling, redness, neuro s/s's.   Review of Systems    + ear fullness +L wrist/thumb pain, R shoulder pain 10 pt ros neg otherwise       Objective:    BP 120/76   Pulse 86   Temp 98 F (36.7 C) (Oral)   Ht 5\' 6"  (1.676 m)   Wt 208 lb 4 oz (94.5 kg)   SpO2 96%   BMI 33.61 kg/m  Body mass index is 33.61 kg/m. Heart: RRR Neuro: Shuffling gait, DTR's equal and symm throughout MSK: +TTP over extensor pollicus on L, mild ttp over anterior R shoulder- neg Neer's, cross over, Speed's, Hawkins, empty can, lift off Lungs: CTAB, no access msc use.  Psych: Nml affect/mood Ears: 90% obstructed w wax b/l Nares: Patent w/o dc Mouth: MMM  Advanced Directives 04/25/2021 11/13/2020 11/08/2020 05/17/2020 01/05/2020 11/24/2019 06/23/2019  Does Patient Have a Medical Advance Directive? No No No No No No No  Would patient like information on creating a medical advance directive? - - No - Patient declined - - - No - Patient declined    Current Medications (verified) Outpatient Encounter Medications as of 08/05/2021  Medication Sig   aspirin 81 MG chewable tablet Chew 81 mg by mouth daily.   carbidopa-levodopa (SINEMET IR) 25-100 MG tablet Take 1 tablet by mouth 4 (four) times daily. 7am/11am/3pm/7pm   Cholecalciferol (VITAMIN D3) 3000 UNITS TABS Take 1,000 Units by mouth daily.    clonazePAM (KLONOPIN) 0.5 MG  tablet TAKE 0.5 TABLETS (0.25 MG TOTAL) BY MOUTH AT BEDTIME.   COVID-19 mRNA vaccine, Pfizer, 30 MCG/0.3ML injection INJECT AS DIRECTED   cyanocobalamin 100 MCG tablet Take 1,000 mcg by mouth daily.    famotidine (PEPCID) 10 MG tablet Take 10 mg by mouth 2 (two) times daily.   Melatonin 3 MG TABS Take 3 mg by mouth at bedtime.    pramipexole (MIRAPEX) 0.75 MG tablet Take 1 tablet (0.75 mg total) by mouth 3 (three) times daily.   Allergies (verified) Patient has no known allergies.   History: Past Medical History:  Diagnosis Date   Cervical lymphadenopathy    right - followed my ENT   Dystonia 1994   diagnosed in [place]   Non Hodgkin's lymphoma ([place]) 2007   diffuse- 6 cycles of chemo with R CHOP Rituxan - last dose 10/08   Parkinson disease ([place])    2015   Past Surgical History:  Procedure Laterality Date   CHOLECYSTECTOMY, LAPAROSCOPIC     EYE SURGERY     x2 as child   PORT-A-CATH REMOVAL     PORTACATH PLACEMENT     SPLENECTOMY     TONSILLECTOMY     Family History  Problem Relation Age of Onset   Heart disease Mother    Cancer Mother        lung   Heart disease Father  Social History   Socioeconomic History   Marital status: Married   Highest education level: Bachelor's degree (e.g., BA, AB, BS)  Tobacco Use   Smoking status: Never   Smokeless tobacco: Never  Vaping Use   Vaping Use: Never used  Substance and Sexual Activity   Alcohol use: No    Alcohol/week: 0.0 standard drinks   Drug use: No  Social History Narrative   Right handed   3 story home   Lives with spouse   Tobacco Counseling Counseling given: N/A  Diabetic? No  Activities of Daily Living In your present state of health, do you have any difficulty performing the following activities: 08/05/2021  Hearing? N  Vision? N  Difficulty concentrating or making decisions? N  Walking or climbing stairs? N  Dressing or bathing? N  Doing errands, shopping? N  Some recent data might be hidden     Patient Care Team: [name], DO as PCP - General (Family Medicine) Tat, Eustace Quail, DO as Consulting Physician (Neurology)  Indicate any recent Medical Services you may have received from other than [facility] providers in the past year (date may be approximate).     Assessment:   This is a routine wellness examination for [facility].  Encounter for annual wellness exam in Medicare patient  Pure hypercholesterolemia - Plan: Comprehensive metabolic panel, Lipid panel  Encounter for hepatitis C screening test for low risk patient - Plan: Hepatitis C antibody  Bilateral impacted cerumen  Chronic right shoulder pain  Left wrist pain  Hearing/Vision screen Vision Screening   Right eye Left eye Both eyes  Without correction 20/20 20/60 20/40   With correction       Dietary issues and exercise activities discussed:     Goals Addressed   None    Depression Screen PHQ 2/9 Scores 08/05/2021 12/24/2016 05/22/2016  PHQ - 2 Score 0 0 0    Fall Risk Fall Risk  08/05/2021 04/25/2021 11/13/2020 05/17/2020 11/24/2019  Falls in the past year? 0 0 0 0 0  Number falls in past yr: 0 0 0 0 0  Injury with Fall? 0 0 0 0 0  Risk for fall due to : No Fall Risks - - - -  Follow up Falls evaluation completed - - - -    FALL RISK PREVENTION PERTAINING TO THE HOME:  Any stairs in or around the home? Yes  If so, are there any without handrails? No  Home free of loose throw rugs in walkways, pet beds, electrical cords, etc? Yes  Adequate lighting in your home to reduce risk of falls? Yes   ASSISTIVE DEVICES UTILIZED TO PREVENT FALLS:  Life alert? No  Use of a cane, walker or w/c? No  Grab bars in the bathroom? Yes  Shower chair or bench in shower? No  Elevated toilet seat or a handicapped toilet? No   TIMED UP AND GO:  Was the test performed? Yes .  Length of time to ambulate 10 feet: 9 sec.   Gait slow and steady without use of assistive device  Cognitive Function: MMSE -  Mini Mental State Exam 04/29/2018  Not completed: Unable to complete   Immunizations Immunization History  Administered Date(s) Administered   Fluad Quad(high Dose 65+) 07/14/2019   Influenza Whole 09/06/2007, 06/28/2008   Influenza, High Dose Seasonal PF 06/20/2016, 07/02/2017, 07/15/2018, 06/28/2020   Influenza,inj,Quad PF,6+ Mos 07/10/2014, 06/11/2015   Meningococcal B, OMV 04/02/2015   Meningococcal Conjugate 04/02/2015, 06/11/2015   PFIZER(Purple Top)SARS-COV-2 Vaccination  11/04/2019, 11/29/2019, 10/02/2020   Pneumococcal Conjugate-13 04/02/2015   Pneumococcal Polysaccharide-23 06/23/2006, 07/01/2016   Td 05/04/2006, 06/20/2016    TDAP status: Up to date  Flu Vaccine status: Completed at today's visit  Pneumococcal vaccine status: Up to date  Covid-19 vaccine status: Information provided on how to obtain vaccines.   Qualifies for Shingles Vaccine? Yes    Shingrix Completed?: No.    Education has been provided regarding the importance of this vaccine. Patient has been advised to call insurance company to determine out of pocket expense if they have not yet received this vaccine. Advised may also receive vaccine at local pharmacy or Health Dept. Verbalized acceptance and understanding.  Screening Tests Health Maintenance  Topic Date Due   Hepatitis C Screening  Never done   INFLUENZA VACCINE  04/22/2021   COVID-19 Vaccine (4 - Booster for Pfizer series) 11/05/2021 (Originally 11/27/2020)   Zoster Vaccines- Shingrix (1 of 2) 02/02/2022 (Originally 06/26/1970)   COLONOSCOPY (Pts 45-70yrs Insurance coverage will need to be confirmed)  06/04/2025   TETANUS/TDAP  06/20/2026   Pneumonia Vaccine 53+ Years old  Completed   HPV VACCINES  Aged Out    Health Maintenance  Health Maintenance Due  Topic Date Due   Hepatitis C Screening  Never done   INFLUENZA VACCINE  04/22/2021    Colorectal cancer screening: Type of screening: Colonoscopy. Completed 06/05/15. Repeat every 10  years  Lung Cancer Screening: (Low Dose CT Chest recommended if Age 2-80 years, 30 pack-year currently smoking OR have quit w/in 15years.) does not qualify.   Lung Cancer Screening Referral: N/A  Additional Screening:  Hepatitis C Screening: does qualify; Completed today.   Vision Screening: Recommended annual ophthalmology exams for early detection of glaucoma and other disorders of the eye. Is the patient up to date with their annual eye exam?  Yes  Who is the provider or what is the name of the office in which the patient attends annual eye exams? Does not remember If pt is not established with a provider, would they like to be referred to a provider to establish care? No .   Dental Screening: Recommended annual dental exams for proper oral hygiene  Community Resource Referral / Chronic Care Management: CRR required this visit?  No   CCM required this visit?  No   Care team: [name], DO as PCP Alonza Bogus, DO as Neurologist Zenovia Jarred, MD as Gastroenterologist  Plan:     I have personally reviewed and noted the following in the patient's chart:   Medical and social history Use of alcohol, tobacco or illicit drugs  Current medications and supplements including opioid prescriptions. Patient is not currently taking opioid prescriptions. Functional ability and status Nutritional status Physical activity Advanced directives List of other physicians Hospitalizations, surgeries, and ER visits in previous 12 months Vitals Screenings to include cognitive, depression, and falls Referrals and appointments  Shoulder/wrist pain: Chronic, unstable. Tylenol, heat, ice, stretches/exercises. Sports med if no better.  Cerumen: remove today. Home care instructions provided.  Flu shot today.  Will come back for labs.   In addition, I have reviewed and discussed with patient certain preventive protocols, quality metrics, and best practice recommendations. A written  personalized care plan for preventive services as well as general preventive health recommendations were provided to patient.     [place], DO   08/05/2021

## 2021-08-06 ENCOUNTER — Ambulatory Visit: Payer: Medicare Other | Admitting: Physical Therapy

## 2021-08-06 ENCOUNTER — Encounter: Payer: Self-pay | Admitting: Physical Therapy

## 2021-08-06 ENCOUNTER — Ambulatory Visit: Payer: Medicare Other | Attending: Neurology

## 2021-08-06 DIAGNOSIS — R293 Abnormal posture: Secondary | ICD-10-CM | POA: Insufficient documentation

## 2021-08-06 DIAGNOSIS — R29818 Other symptoms and signs involving the nervous system: Secondary | ICD-10-CM

## 2021-08-06 DIAGNOSIS — R2689 Other abnormalities of gait and mobility: Secondary | ICD-10-CM | POA: Insufficient documentation

## 2021-08-06 DIAGNOSIS — R471 Dysarthria and anarthria: Secondary | ICD-10-CM | POA: Insufficient documentation

## 2021-08-06 DIAGNOSIS — R2681 Unsteadiness on feet: Secondary | ICD-10-CM | POA: Insufficient documentation

## 2021-08-06 NOTE — Therapy (Signed)
[facility] [address] [place], [place], [address] Phone: [phone]   Fax:  [phone]  Physical Therapy Evaluation  Patient Details  Name: [name] [name] MRN: [id number] Date of Birth: [birth year] Referring Provider (PT): Tat, Wells Guiles   Encounter Date: 08/06/2021   PT End of Session - 08/06/21 1217     Visit Number 1    Number of Visits 17    Date for PT Re-Evaluation 10/04/21    PT Start Time 1220    PT Stop Time 0093    PT Time Calculation (min) 55 min    Activity Tolerance Patient tolerated treatment well    Behavior During Therapy [facility] for tasks assessed/performed             Past Medical History:  Diagnosis Date   Cervical lymphadenopathy    right - followed my ENT   Dystonia 1994   diagnosed in [place]   Non Hodgkin's lymphoma ([place]) 2007   diffuse- 6 cycles of chemo with R CHOP Rituxan - last dose 10/08   Parkinson disease ([place])    2015    Past Surgical History:  Procedure Laterality Date   CHOLECYSTECTOMY, LAPAROSCOPIC     EYE SURGERY     x2 as child   PORT-A-CATH REMOVAL     PORTACATH PLACEMENT     SPLENECTOMY     TONSILLECTOMY      There were no vitals filed for this visit.    Subjective Assessment - 08/06/21 1227     Subjective Pt was seen for PD screen in October and reports he feels slower and more off-balance, more episodes of feet getting stuck.  Feet getting stuck can happen anytime, but particularly at night, about an hour before bed.  (Sometimes go to bed as late as 1-2 am)Haven't had any falls, but do take extra steps to get my balance with the getting stuck episodes.  Does not use assistive device.    Patient Stated Goals Pt's goals are to not fall and to move better overall; better posture.    Currently in Pain? Yes    Pain Score --   not at eval, but pain at times in R low back and hip   Pain Location Back    Pain Orientation Right    Pain Descriptors / Indicators Aching     Pain Type Acute pain    Pain Onset More than a month ago    Pain Frequency Intermittent    Aggravating Factors  certain positions, increased activity    Pain Relieving Factors stretches help    Effect of Pain on Daily Activities PT will monitor and attempt to address with posture exercises                [facility] PT Assessment - 08/06/21 1237       Assessment   Medical Diagnosis Parkinson's disease    Referring Provider (PT) Tat, Wells Guiles    Onset Date/Surgical Date 07/16/21   PD screens recommendation for eval   Hand Dominance Right      Precautions   Precautions Fall    Precaution Comments Chronic pain R shoulder; PISA syndrome      Balance Screen   Has the patient fallen in the past 6 months No    Has the patient had a decrease in activity level because of a fear of falling?  No    Is the patient reluctant to leave their home because of a  fear of falling?  No      Home Environment   Living Environment Private residence    Living Arrangements Spouse/significant other    Type of [place] entrance    Home Layout Multi-level    Alternate Level Stairs-Number of Steps 12    Alternate Level Stairs-Rails Can reach both    [place] seat - built in;Grab bars - tub/shower;Grab bars - toilet    Additional Comments main caregiver for his wife (but reports his wife remains very independent)      Prior Function   Level of Independence Independent    Leisure Participates in [employer] exercise classes.  Enjoys hobbies-restoring electronics      Observation/Other Assessments   Focus on Therapeutic Outcomes (FOTO)  NA      Posture/Postural Control   Posture/Postural Control Postural limitations    Postural Limitations Rounded Shoulders;Forward head;Posterior pelvic tilt;Weight shift right    Posture Comments R lean in sitting and standing; flexible-pt is able to move out of this      ROM / Strength   AROM / PROM / Strength  AROM;Strength      AROM   Overall AROM  Within functional limits for tasks performed    Overall AROM Comments BLES      Strength   Overall Strength Within functional limits for tasks performed   with MMT     Transfers   Transfers Sit to Stand;Stand to Sit    Sit to Stand 6: Modified independent (Device/Increase time);Without upper extremity assist;From chair/3-in-1    Five time sit to stand comments  8.62    Stand to Sit 6: Modified independent (Device/Increase time);Without upper extremity assist;To chair/3-in-1      Ambulation/Gait   Ambulation/Gait Yes    Ambulation/Gait Assistance 7: Independent    Ambulation Distance (Feet) 60 Feet   x 4   Assistive device None    Gait Pattern Step-through pattern;Decreased trunk rotation;Lateral trunk lean to right;Poor foot clearance - left;Poor foot clearance - right    Ambulation Surface Level;Indoor    Gait velocity 10.38 sec = 3.16 ft/sec      Standardized Balance Assessment   Standardized Balance Assessment Timed Up and Go Test      Mini-BESTest   Sit To Stand Normal: Comes to stand without use of hands and stabilizes independently.    Rise to Toes < 3 s.   1.78   Stand on one leg (left) Moderate: < 20 s   3.22, 2.16   Stand on one leg (right) Moderate: < 20 s   6.59, 4.58   Stand on one leg - lowest score 1    Compensatory Stepping Correction - Forward Normal: Recovers independently with a single, large step (second realignement is allowed).    Compensatory Stepping Correction - Backward Moderate: More than one step is required to recover equilibrium   4 steps to recover   Compensatory Stepping Correction - Left Lateral Normal: Recovers independently with 1 step (crossover or lateral OK)    Compensatory Stepping Correction - Right Lateral Normal: Recovers independently with 1 step (crossover or lateral OK)    Stepping Corredtion Lateral - lowest score 2    Stance - Feet together, eyes open, firm surface  Normal: 30s    Stance -  Feet together, eyes closed, foam surface  Normal: 30s    Incline - Eyes Closed Normal: Stands independently 30s and aligns with gravity  Change in Gait Speed Normal: Significantly changes walkling speed without imbalance    Walk with head turns - Horizontal Normal: performs head turns with no change in gait speed and good balance    Walk with pivot turns Moderate:Turns with feet close SLOW (>4 steps) with good balance.    Step over obstacles Normal: Able to step over box with minimal change of gait speed and with good balance.    Timed UP & GO with Dual Task Moderate: Dual Task affects either counting OR walking (>10%) when compared to the TUG without Dual Task.    Mini-BEST total score 22      Timed Up and Go Test   TUG Normal TUG;Manual TUG;Cognitive TUG    Normal TUG (seconds) 10.06    Manual TUG (seconds) 10.94    Cognitive TUG (seconds) 12.07   missed numbers   TUG Comments uncontrolled descent in sitting                        Objective measurements completed on examination: See above findings.            Therapeutic Activity:  Utilized mirror with tactile cues for elongation of R trunk in standing.  Discussed/practiced PWR! Up and PWR! Rock (same side reach and across body reach for stretching/elongating R trunk)    PT Education - 08/06/21 1327     Education Details PT eval results, POC; postural re-education using visual cues of mirror; use of PWR! Up, PWR! Rock to reset posture with freezing episodes or reset posture with increased forward/R lean    Person(s) Educated Patient    Methods Explanation;Demonstration;Verbal cues    Comprehension Verbalized understanding              PT Short Term Goals - 08/06/21 1410       PT SHORT TERM GOAL #1   Title Pt will be independent with HEP for improved posture, strength, balance, transfers, and gait.  TARGET 09/06/2021    Time 4    Period Weeks    Status New      PT SHORT TERM GOAL #2   Title Pt  will verbalize tips to reduce freezing episodes for improved gait in home and community environment.    Time 4    Period Weeks    Status New      PT SHORT TERM GOAL #3   Title Pt will improve push and release test posteriorly, to <2 steps independently regaining balance, for improved balance reactions.    Baseline 4 steps    Time 4    Period Weeks    Status New               PT Long Term Goals - 08/06/21 1413       PT LONG TERM GOAL #1   Title Pt will be independent with progression of HEP and community fitness plans upon d/c from PT for continuing to maximize gains made in PT.  TARGET 10/04/21    Time 8    Period Weeks    Status New      PT LONG TERM GOAL #2   Title Pt will improve gait velocity to at least 4 ft/sec for improved gait efficiency and safety in the community.    Baseline 3.16 ft/sec    Time 8    Period Weeks    Status New      PT LONG TERM GOAL #3   Title Pt  will improve MiniBESTest score to at least 25/28 to decrease fall risk.    Baseline 22/28    Time 8    Period Weeks    Status New      PT LONG TERM GOAL #4   Title Pt will improve TUG/TUG cognitive to <10% difference for improved dual taskign with gait.    Baseline 10.06, 12.07 sec cog    Time 8    Period Weeks    Status New                    Plan - 08/06/21 1342     Clinical Impression Statement Pt is a 70 year old male who presents to OPPT with history of Parkinson's disease.  He comes for PT eval based on recommendation at PD screens 07/16/21.  He presents with decreased functional strength (decreased eccentric control in sitting), abnormal posture, postural instability, decreased balance, decreased timing and coordination.  Pt demonstrates diffuculty with dual tasking, fall risk per MiniBESTest.  Pt has slightly decreased measures as compared to when he was in therapy in spring 2022.  He would benefit from skilled PT to address the above stated deficits to decrease fall risk and  improve overall functional mobility.    Personal Factors and Comorbidities Comorbidity 3+    Comorbidities MH:  cervical lymphadenopathy, dystonia, Non Hodgkin's lymphoma    Examination-Activity Limitations Locomotion Level;Transfers;Stand    Examination-Participation Restrictions Community Activity;Other   Hobbies   Stability/Clinical Decision Making Evolving/Moderate complexity    Clinical Decision Making Moderate    Rehab Potential Good    PT Frequency 2x / week    PT Duration 8 weeks   plus eval   PT Treatment/Interventions ADLs/Self Care Home Management;Gait training;Functional mobility training;Therapeutic activities;Therapeutic exercise;Balance training;Neuromuscular re-education;Manual techniques;Patient/family education;Passive range of motion    PT Next Visit Plan Postural re-education, use of specific PWR! Moves for exercise for postural stretching and strengthening.  Reinforce tips to reduce freezing episodes; work on quick stop/starts, changes of directions, stepping over obstacles (riverstones).  Freezing of gait questionnaire; review pt's shoulder ex from MD to make sure which are most appropriate to reinforce posture    Consulted and Agree with Plan of Care Patient             Patient will benefit from skilled therapeutic intervention in order to improve the following deficits and impairments:  Abnormal gait, Difficulty walking, Decreased balance, Postural dysfunction, Decreased strength, Decreased mobility  Visit Diagnosis: Unsteadiness on feet  Other abnormalities of gait and mobility  Abnormal posture  Other symptoms and signs involving the nervous system     Problem List Patient Active Problem List   Diagnosis Date Noted   Healthcare maintenance 06/28/2020   Gastroesophageal reflux disease 03/07/2020   Localized swelling of right lower extremity 04/12/2019   DOE (dyspnea on exertion) 07/15/2018   10 year risk of MI or stroke 7.5% or greater 04/12/2018    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, [place], [place], [address] Phone: [phone]   Fax:  [phone]  Name: DEJUAN ELMAN MRN: [id number] Date  of Birth: March 05, 1951

## 2021-08-06 NOTE — Patient Instructions (Signed)
   EVERY DAY: 5 loud "ah"  - twice a day Recite your everyday sentences (sentences you would say EVERYDAY) twice a day

## 2021-08-08 ENCOUNTER — Ambulatory Visit: Payer: Medicare Other | Attending: Internal Medicine

## 2021-08-08 ENCOUNTER — Ambulatory Visit: Payer: Medicare Other

## 2021-08-08 ENCOUNTER — Other Ambulatory Visit: Payer: Self-pay

## 2021-08-08 DIAGNOSIS — R293 Abnormal posture: Secondary | ICD-10-CM | POA: Diagnosis not present

## 2021-08-08 DIAGNOSIS — Z23 Encounter for immunization: Secondary | ICD-10-CM

## 2021-08-08 DIAGNOSIS — R2689 Other abnormalities of gait and mobility: Secondary | ICD-10-CM | POA: Diagnosis not present

## 2021-08-08 DIAGNOSIS — R2681 Unsteadiness on feet: Secondary | ICD-10-CM | POA: Diagnosis not present

## 2021-08-08 DIAGNOSIS — R471 Dysarthria and anarthria: Secondary | ICD-10-CM

## 2021-08-08 DIAGNOSIS — R29818 Other symptoms and signs involving the nervous system: Secondary | ICD-10-CM | POA: Diagnosis not present

## 2021-08-08 NOTE — Therapy (Signed)
[facility] [address] [place], [place], [address] Phone: [phone]   Fax:  [phone]  Speech Language Pathology Evaluation  Patient Details  Name: MARTYN TIMME MRN: [id number] Date of Birth: 1951/05/14 Referring Provider (SLP): Alonza Bogus, DO   Encounter Date: 08/06/2021   End of Session - 08/08/21 1642     Visit Number 1    Number of Visits 17    Date for SLP Re-Evaluation 10/14/21   extended one week due to holiday   SLP Start Time 1319    SLP Stop Time  1400    SLP Time Calculation (min) 41 min    Activity Tolerance Patient tolerated treatment well             Past Medical History:  Diagnosis Date   Cervical lymphadenopathy    right - followed my ENT   Dystonia 1994   diagnosed in [place]   Non Hodgkin's lymphoma ([place]) 2007   diffuse- 6 cycles of chemo with R CHOP Rituxan - last dose 10/08   Parkinson disease ([place])    2015    Past Surgical History:  Procedure Laterality Date   CHOLECYSTECTOMY, LAPAROSCOPIC     EYE SURGERY     x2 as child   PORT-A-CATH REMOVAL     PORTACATH PLACEMENT     SPLENECTOMY     TONSILLECTOMY      There were no vitals filed for this visit.       SLP Evaluation [facility] - 08/08/21 1639       SLP Visit Information   SLP Received On 08/06/21    Referring Provider (SLP) Alonza Bogus, DO    Onset Date dx 2015    Medical Diagnosis Parkinson's Disease      Subjective   Patient/Family Stated Goal Improve speech clarity/intelligibility      Pain Assessment   Currently in Pain? No/denies      General Information   HPI Pt had course of ST for speech intelligiblity in September 2019      Balance Screen   Has the patient fallen in the past 6 months No    Has the patient had a decrease in activity level because of a fear of falling?  No    Is the patient reluctant to leave their home because of a fear of falling?  No      Prior Functional Status    Cognitive/Linguistic Baseline Within functional limits    Type of Home House     Lives With Spouse    Available Support Family;Friend(s)    Vocation Retired      Associate Professor   Overall Cognitive Status --   TBA in first 1-2 sessions     Auditory Comprehension   Overall Auditory Comprehension Appears within functional limits for tasks assessed      Verbal Expression   Overall Verbal Expression Appears within functional limits for tasks assessed      Oral Motor/Sensory Function   Overall Oral Motor/Sensory Function Impaired    Labial Coordination Reduced    Lingual Coordination Reduced    Velum Within Functional Limits      Motor Speech   Overall Motor Speech Impaired    Respiration --   mixed chest and abdominal - mostly abdominal breathing   Resonance Hypernasality   mild - longstanding   Effective Techniques Increased vocal intensity  SLP Education - 08/08/21 1641     Education Details do loud /a/ again, develop 10 everyday sentences, need to recalibrate again    Person(s) Educated Patient    Methods Explanation;Demonstration;Verbal cues    Comprehension Verbalized understanding;Returned demonstration;Verbal cues required;Need further instruction              SLP Short Term Goals - 08/08/21 1651       SLP SHORT TERM GOAL #1   Title Pt will achieve 90dB average on loud /a/ over 3 sessions    Time 4    Period Weeks    Status New    Target Date 08/30/21      SLP SHORT TERM GOAL #2   Title Pt will mainitain average of 70dB during sentence level responses with rare min cues over two sessions    Time 4    Period Weeks    Status New    Target Date 08/30/21      SLP SHORT TERM GOAL #3   Title In 5 minutes simple conversation pt will achieve average speech volume of low 70s dB over two sessions    Time 4    Period Weeks    Status New    Target Date 08/30/21      SLP SHORT TERM GOAL #4   Title Pt will use abdominal  breathing 75% of the time in 5 minutes simple conversation in 2 sessions    Time 4    Period Weeks    Status New    Target Date 08/30/21              SLP Long Term Goals - 08/08/21 1653       SLP LONG TERM GOAL #1   Title Pt will maintain average of low 70s dB over 10 minute simple conversation with rare min A over three sessions    Time 9    Period Weeks    Status New    Target Date 10/04/21      SLP LONG TERM GOAL #2   Title Pt will be audible over 15 minute conversation in noisy environment/outside of therapy room with min rare A x 2 sessions    Time 9    Period Weeks    Status New    Target Date 10/04/21      SLP LONG TERM GOAL #3   Title Pt will report less frequent requests to repeat himself outside of [place] room over 2 visits.    Time 9    Period Weeks    Status New    Target Date 10/04/21      SLP LONG TERM GOAL #4   Title pt will use abdominal breathing 75% of the time in 10 minutes simple conversation in three sessions    Time 9    Period Weeks    Status New    Target Date 10/04/21              Plan - 08/08/21 1643     Clinical Impression Statement Pt presents today with mod hypokinetic dysarthria due to parkinson's disease (PD), with bothclavicular and abdominal breathing. Pt reports WNL eating/drinking, denying overt s/s aspiration during meals, however pt may require objective swallow eval during this therapy course (MBSS, FEES). Pt talked at suboptimal dB (low to mid 60s dB average) in simple conversation. SLP reminded pt of loud /a/ practice and pt admitted he had not been completing this at home for a while (over 6 months).  Loud /a/averaged low-mid 90s dB, and then pt was told to use same effort as with loud /a/ to respond and pt demonstraed success at improving his overall volume to mid to upper 60s dB average in some question and answer tasks and 60 seconds of simple conversation. SLP used tactile cue of hand on abdominal musculature for tactile  feedback for pt to use stronger more exhuberant voice. SLP believes pt will benefit from skilled ST targeting incr'd volume and greater usage of abdomoinal breathing in order to improve communicative effectiveness.    Speech Therapy Frequency 2x / week    Duration --   9 weeks   Treatment/Interventions SLP instruction and feedback;Compensatory strategies;Patient/family education;Functional tasks;Multimodal communcation approach;Internal/external aids;Environmental controls    Potential to [place] provided today - /a/ and everyday sentences    Consulted and Agree with Plan of Care Patient             Patient will benefit from skilled therapeutic intervention in order to improve the following deficits and impairments:   Dysarthria and anarthria    Problem List Patient Active Problem List   Diagnosis Date Noted   Healthcare maintenance 06/28/2020   Gastroesophageal reflux disease 03/07/2020   Localized swelling of right lower extremity 04/12/2019   DOE (dyspnea on exertion) 07/15/2018   10 year risk of MI or stroke 7.5% or greater 04/12/2018   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 [place], [place], [address] Phone: [phone]   Fax:  [phone]  Name: CARNIE BRUEMMER MRN: [id number] Date of Birth: 16-Aug-1951

## 2021-08-08 NOTE — Therapy (Signed)
[facility] [address] [place], [place], [address] Phone: [phone]   Fax:  [phone]  Speech Language Pathology Treatment  Patient Details  Name: [name] [name] MRN: [id number] Date of Birth: 11/03/1950 Referring Provider (SLP): Alonza Bogus, DO   Encounter Date: 08/08/2021   End of Session - 08/08/21 1657     Visit Number 2    Number of Visits 17    Date for SLP Re-Evaluation 10/14/21   extended one week due to holiday   SLP Start Time 0626    SLP Stop Time  1445    SLP Time Calculation (min) 34 min    Activity Tolerance Patient tolerated treatment well             Past Medical History:  Diagnosis Date   Cervical lymphadenopathy    right - followed my ENT   Dystonia 1994   diagnosed in [place]   Non Hodgkin's lymphoma ([place]) 2007   diffuse- 6 cycles of chemo with R CHOP Rituxan - last dose 10/08   Parkinson disease ([place])    2015    Past Surgical History:  Procedure Laterality Date   CHOLECYSTECTOMY, LAPAROSCOPIC     EYE SURGERY     x2 as child   PORT-A-CATH REMOVAL     PORTACATH PLACEMENT     SPLENECTOMY     TONSILLECTOMY      There were no vitals filed for this visit.   Subjective Assessment - 08/08/21 1412     Subjective Arrived 10 minutes late.    Currently in Pain? No/denies                   ADULT SLP TREATMENT - 08/08/21 1413       General Information   Behavior/Cognition Alert;Cooperative;Pleasant mood      Treatment Provided   Treatment provided Cognitive-Linquistic      Cognitive-Linquistic Treatment   Treatment focused on Dysarthria    Skilled Treatment "I tell everyone I'm off to see the wizard." Pt completed his loud /a/ with low 90s dB average. Pt forgot his everyday sentences so SLP moved to reading conversational sentences, which pt did with low-mid 80s dB. In sentence responses and pt produced these with initial rare min A to maintain volume throughout the whole  utterance, faded to modified independent. Homework provided reading/responding.      Assessment / Recommendations / Plan   Plan Continue with current plan of care      Progression Toward Goals   Progression toward goals Progressing toward goals                SLP Short Term Goals - 08/08/21 1658       SLP SHORT TERM GOAL #1   Title Pt will achieve 90dB average on loud /a/ over 3 sessions    Baseline 08-08-21    Time 4    Period Weeks    Status On-going    Target Date 08/30/21      SLP SHORT TERM GOAL #2   Title Pt will mainitain average of 70dB during sentence level responses with rare min cues over two sessions    Time 4    Period Weeks    Status On-going    Target Date 08/30/21      SLP SHORT TERM GOAL #3   Title In 5 minutes simple conversation pt will achieve average speech volume of low 70s dB over two sessions  Time 4    Period Weeks    Status On-going    Target Date 08/30/21      SLP SHORT TERM GOAL #4   Title Pt will use abdominal breathing 75% of the time in 5 minutes simple conversation in 2 sessions    Time 4    Period Weeks    Status On-going    Target Date 08/30/21              SLP Long Term Goals - 08/08/21 1659       SLP LONG TERM GOAL #1   Title Pt will maintain average of low 70s dB over 10 minute simple conversation with rare min A over three sessions    Time 9    Period Weeks    Status On-going      SLP LONG TERM GOAL #2   Title Pt will be audible over 15 minute conversation in noisy environment/outside of therapy room with min rare A x 2 sessions    Time 9    Period Weeks    Status On-going      SLP LONG TERM GOAL #3   Title Pt will report less frequent requests to repeat himself outside of [place] room over 2 visits.    Time 9    Period Weeks    Status On-going      SLP LONG TERM GOAL #4   Title pt will use abdominal breathing 75% of the time in 10 minutes simple conversation in three sessions    Time 9    Period Weeks     Status On-going              Plan - 08/08/21 1657     Clinical Impression Statement Pt presents today with cont'd mod hypokinetic dysarthria due to parkinson's disease (PD), with both clavicular and abdominal breathing. Pt reports WNL eating/drinking, denying overt s/s aspiration during meals, however pt may require objective swallow eval during this therapy course (MBSS, FEES). Pt began today with loud /a/ at low 90's dB, and incr'd volume in speech in reading/sentence response tasks. SLP believes pt will cont to benefit from skilled ST targeting incr'd volume and greater usage of abdomoinal breathing in order to improve communicative effectiveness.    Speech Therapy Frequency 2x / week    Duration --   9 weeks   Treatment/Interventions SLP instruction and feedback;Compensatory strategies;Patient/family education;Functional tasks;Multimodal communcation approach;Internal/external aids;Environmental controls    Potential to [place] provided today - /a/ and everyday sentences    Consulted and Agree with Plan of Care Patient             Patient will benefit from skilled therapeutic intervention in order to improve the following deficits and impairments:   Dysarthria and anarthria    Problem List Patient Active Problem List   Diagnosis Date Noted   Healthcare maintenance 06/28/2020   Gastroesophageal reflux disease 03/07/2020   Localized swelling of right lower extremity 04/12/2019   DOE (dyspnea on exertion) 07/15/2018   10 year risk of MI or stroke 7.5% or greater 04/12/2018   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

## 2021-08-08 NOTE — Patient Instructions (Signed)
  Please complete the assigned speech therapy homework prior to your next session.  

## 2021-08-08 NOTE — Progress Notes (Signed)
   Covid-19 Vaccination Clinic  Name:  [name] [name]    MRN: [id number] DOB: 10/26/1950  08/08/2021  [name] was observed post Covid-19 immunization for 15 minutes without incident. He was provided with Vaccine Information Sheet and instruction to access the V-Safe system.   [name] was instructed to call 911 with any severe reactions post vaccine: Difficulty breathing  Swelling of face and throat  A fast heartbeat  A bad rash all over body  Dizziness and weakness   Immunizations Administered     Name Date Dose VIS Date Route   Pfizer Covid-19 Vaccine Bivalent Booster 08/08/2021  9:55 AM 0.3 mL 05/22/2021 Intramuscular   Manufacturer: [place]   Lot: [id number]   [place]: [phone]

## 2021-08-12 ENCOUNTER — Encounter: Payer: Self-pay | Admitting: Physical Therapy

## 2021-08-12 ENCOUNTER — Other Ambulatory Visit: Payer: Self-pay

## 2021-08-12 ENCOUNTER — Ambulatory Visit: Payer: Medicare Other | Admitting: Physical Therapy

## 2021-08-12 ENCOUNTER — Ambulatory Visit: Payer: Medicare Other

## 2021-08-12 DIAGNOSIS — R471 Dysarthria and anarthria: Secondary | ICD-10-CM

## 2021-08-12 DIAGNOSIS — R2681 Unsteadiness on feet: Secondary | ICD-10-CM | POA: Diagnosis not present

## 2021-08-12 DIAGNOSIS — R2689 Other abnormalities of gait and mobility: Secondary | ICD-10-CM | POA: Diagnosis not present

## 2021-08-12 DIAGNOSIS — R29818 Other symptoms and signs involving the nervous system: Secondary | ICD-10-CM | POA: Diagnosis not present

## 2021-08-12 DIAGNOSIS — R293 Abnormal posture: Secondary | ICD-10-CM | POA: Diagnosis not present

## 2021-08-12 NOTE — Therapy (Signed)
[facility] [address], [place], [place], [address] Phone: [phone]   Fax:  [phone]  Speech Language Pathology Treatment  Patient Details  Name: [name] [name] MRN: [id number] Date of Birth: 1951/09/17 Referring Provider (SLP): Alonza Bogus, DO   Encounter Date: 08/12/2021   End of Session - 08/12/21 1542     Visit Number 3    Number of Visits 17    Date for SLP Re-Evaluation 10/14/21   extended one week due to holiday   SLP Start Time 1450    SLP Stop Time  6962    SLP Time Calculation (min) 40 min    Activity Tolerance Patient tolerated treatment well             Past Medical History:  Diagnosis Date   Cervical lymphadenopathy    right - followed my ENT   Dystonia 1994   diagnosed in [place]   Non Hodgkin's lymphoma ([facility]) 2007   diffuse- 6 cycles of chemo with R CHOP Rituxan - last dose 10/08   Parkinson disease ([place])    2015    Past Surgical History:  Procedure Laterality Date   CHOLECYSTECTOMY, LAPAROSCOPIC     EYE SURGERY     x2 as child   PORT-A-CATH REMOVAL     PORTACATH PLACEMENT     SPLENECTOMY     TONSILLECTOMY      There were no vitals filed for this visit.   Subjective Assessment - 08/12/21 1454     Subjective "Everybody (in sunday school class) told me I was much better."    Currently in Pain? No/denies                   ADULT SLP TREATMENT - 08/12/21 1453       General Information   Behavior/Cognition Alert;Cooperative;Pleasant mood      Treatment Provided   Treatment provided Cognitive-Linquistic      Cognitive-Linquistic Treatment   Treatment focused on Dysarthria    Skilled Treatment Pt reports completing HEP regularly. To target loudness in conversational speech, he completed his loud /a/ with low 90s dB average. [name] [name] recited his everyday sentences with SBA to maintain volume average low 80s dB. In dialogues of 3 minutes x3 pt maintained mid 60s dB average  loudness with rare nonverbal cues for loudness. Pt acknowledged he had to think during those monologues which made loudness more difficult. Pt thought of strategy "I have to look outside".      Assessment / Recommendations / Plan   Plan Continue with current plan of care      Progression Toward Goals   Progression toward goals Progressing toward goals                SLP Short Term Goals - 08/12/21 1543       SLP SHORT TERM GOAL #1   Title Pt will achieve 90dB average on loud /a/ over 3 sessions    Baseline 08-08-21, 08-12-21    Time 4    Period Weeks    Status On-going    Target Date 08/30/21      SLP SHORT TERM GOAL #2   Title Pt will mainitain average of 70dB during sentence level responses with rare min cues over two sessions    Time 4    Period Weeks    Status On-going    Target Date 08/30/21      SLP SHORT TERM GOAL #3  Title In 5 minutes simple conversation pt will achieve average speech volume of low 70s dB over two sessions    Time 4    Period Weeks    Status On-going    Target Date 08/30/21      SLP SHORT TERM GOAL #4   Title Pt will use abdominal breathing 75% of the time in 5 minutes simple conversation in 2 sessions    Time 4    Period Weeks    Status On-going    Target Date 08/30/21              SLP Long Term Goals - 08/12/21 1543       SLP LONG TERM GOAL #1   Title Pt will maintain average of low 70s dB over 10 minute simple conversation with rare min A over three sessions    Time 9    Period Weeks    Status On-going    Target Date 10/04/21      SLP LONG TERM GOAL #2   Title Pt will be audible over 15 minute conversation in noisy environment/outside of therapy room with min rare A x 2 sessions    Time 9    Period Weeks    Status On-going    Target Date 10/04/21      SLP LONG TERM GOAL #3   Title Pt will report less frequent requests to repeat himself outside of [place] room over 2 visits.    Time 9    Period Weeks    Status On-going     Target Date 10/04/21      SLP LONG TERM GOAL #4   Title pt will use abdominal breathing 75% of the time in 10 minutes simple conversation in three sessions    Time 9    Period Weeks    Status On-going    Target Date 10/04/21              Plan - 08/12/21 1542     Clinical Impression Statement Pt presents today with cont'd mod hypokinetic dysarthria due to parkinson's disease (PD), with both clavicular and abdominal breathing. Pt reports WNL eating/drinking, denying overt s/s aspiration during meals, however pt may require objective swallow eval during this therapy course (MBSS, FEES). Pt is slowly progressing through a heirarchy to achieve louder conversational speech. SLP believes pt will cont to benefit from skilled ST targeting incr'd volume and greater usage of abdomoinal breathing in order to improve communicative effectiveness.    Speech Therapy Frequency 2x / week    Duration --   9 weeks   Treatment/Interventions SLP instruction and feedback;Compensatory strategies;Patient/family education;Functional tasks;Multimodal communcation approach;Internal/external aids;Environmental controls    Potential to [place] provided today - /a/ and everyday sentences    Consulted and Agree with Plan of Care Patient             Patient will benefit from skilled therapeutic intervention in order to improve the following deficits and impairments:   No diagnosis found.    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, [place] 08/12/2021, 3:44 PM  [facility] [address] [place], [place], [address] Phone: [phone]   Fax:  [phone]   Name: ZOE NORDIN MRN: [id number] Date of Birth: 11/02/1950

## 2021-08-12 NOTE — Therapy (Signed)
[facility] [address] [place], [place], [address] Phone: [phone]   Fax:  [phone]  Physical Therapy Treatment  Patient Details  Name: [name] [name] MRN: [id number] Date of Birth: Apr 25, 1951 Referring Provider (PT): Tat, Wells Guiles   Encounter Date: 08/12/2021   PT End of Session - 08/12/21 1616     Visit Number 2    Number of Visits 17    Date for PT Re-Evaluation 10/04/21    PT Start Time 2353    PT Stop Time 6144    PT Time Calculation (min) 42 min    Activity Tolerance Patient tolerated treatment well    Behavior During Therapy [facility] for tasks assessed/performed             Past Medical History:  Diagnosis Date   Cervical lymphadenopathy    right - followed my ENT   Dystonia 1994   diagnosed in [place]   Non Hodgkin's lymphoma ([place]) 2007   diffuse- 6 cycles of chemo with R CHOP Rituxan - last dose 10/08   Parkinson disease ([place])    2015    Past Surgical History:  Procedure Laterality Date   CHOLECYSTECTOMY, LAPAROSCOPIC     EYE SURGERY     x2 as child   PORT-A-CATH REMOVAL     PORTACATH PLACEMENT     SPLENECTOMY     TONSILLECTOMY      There were no vitals filed for this visit.   Subjective Assessment - 08/12/21 1535     Subjective No changes, no pain today.  Been using the mirror to help with my posture.    Patient Stated Goals Pt's goals are to not fall and to move better overall; better posture.    Currently in Pain? No/denies                Using mirror as visual cues for upright posture,  Pt performs PWR! Moves in standing position x 10 reps   PWR! Up for improved posture-PT provides tactile cues at right trunk for elongation; able to remove after several cues  PWR! Rock for improved weighshifting x 10 reps same side rock and reach, then 10 reps reach across body with weightshifting  PWR! Twist for improved trunk rotation, cues for brief hold at end range  PWR! Step for  improved step initiation, cues for upright posture with step out/in               [facility] Adult PT Treatment/Exercise - 08/12/21 0001       Ambulation/Gait   Ambulation/Gait Yes    Ambulation/Gait Assistance 7: Independent    Ambulation Distance (Feet) 360 Feet    Assistive device None    Gait Pattern Step-through pattern;Decreased trunk rotation;Lateral trunk lean to right;Poor foot clearance - left;Poor foot clearance - right    Ambulation Surface Level;Indoor    Gait Comments Cues for posture, arm swing, step length/foot clearance.  Added challenges including quick stop/starts, manual tasks including ball toss/catch, hand to hand coordination tasks for dual tasking.  Pt has several minor episodes of festination and is able to stop, PWR! Up and take a step back then forward to start gait again.                 Balance Exercises - 08/12/21 0001       Balance Exercises: Standing   Stepping Strategy Anterior;Posterior;Lateral;10 reps;Limitations    Stepping Strategy Limitations step over 1/2 bolster for improved foot  clearance and step length.  Used visual cue from mirror to maintain upright posture throughout.    Other Standing Exercises STagger stance forward/back rocking x 10 reps each; forward<>back step and weightshift x 5-10 reps as strategy for tips to reduce freezing episodes.             Negotiated stepping over river stones and low obstacles, 6 reps with mild LOB at end with taking too large a step to avoid obstacles.  Pt able to recover balance with min guard assist.   PT Education - 08/12/21 1717     Education Details HEP additions and instructions/reviewed tips to reduce freezing episodes with gait.    Person(s) Educated Patient    Methods Explanation;Demonstration;Handout;Verbal cues;Tactile cues    Comprehension Verbalized understanding;Returned demonstration              PT Short Term Goals - 08/06/21 1410       PT SHORT TERM GOAL #1    Title Pt will be independent with HEP for improved posture, strength, balance, transfers, and gait.  TARGET 09/06/2021    Time 4    Period Weeks    Status New      PT SHORT TERM GOAL #2   Title Pt will verbalize tips to reduce freezing episodes for improved gait in home and community environment.    Time 4    Period Weeks    Status New      PT SHORT TERM GOAL #3   Title Pt will improve push and release test posteriorly, to <2 steps independently regaining balance, for improved balance reactions.    Baseline 4 steps    Time 4    Period Weeks    Status New               PT Long Term Goals - 08/06/21 1413       PT LONG TERM GOAL #1   Title Pt will be independent with progression of HEP and community fitness plans upon d/c from PT for continuing to maximize gains made in PT.  TARGET 10/04/21    Time 8    Period Weeks    Status New      PT LONG TERM GOAL #2   Title Pt will improve gait velocity to at least 4 ft/sec for improved gait efficiency and safety in the community.    Baseline 3.16 ft/sec    Time 8    Period Weeks    Status New      PT LONG TERM GOAL #3   Title Pt will improve MiniBESTest score to at least 25/28 to decrease fall risk.    Baseline 22/28    Time 8    Period Weeks    Status New      PT LONG TERM GOAL #4   Title Pt will improve TUG/TUG cognitive to <10% difference for improved dual taskign with gait.    Baseline 10.06, 12.07 sec cog    Time 8    Period Weeks    Status New                   Plan - 08/12/21 1718     Clinical Impression Statement Utilized PWR! Moves in standing as exercises to address posture and decrease lean to R wtih standing activities.  Pt tolerates well the use of visual cues of mirror to help wtih feedback for upright posture.  For stepping strategy exercises, used mirror for forward/back stepping; mirror removed  for side stepping, and pt is able to maintain upright posture through this full set of exercises.  With  dynamic gait activities, he does have several minor episodes of festination and he is able to get out of them with stopping, upright posture reset and then taking a step back, then forward to start.  He responds well to repetition and cues for larger movement patterns.    Personal Factors and Comorbidities Comorbidity 3+    Comorbidities MH:  cervical lymphadenopathy, dystonia, Non Hodgkin's lymphoma    Examination-Activity Limitations Locomotion Level;Transfers;Stand    Examination-Participation Restrictions Community Activity;Other   Hobbies   Stability/Clinical Decision Making Evolving/Moderate complexity    Rehab Potential Good    PT Frequency 2x / week    PT Duration 8 weeks   plus eval   PT Treatment/Interventions ADLs/Self Care Home Management;Gait training;Functional mobility training;Therapeutic activities;Therapeutic exercise;Balance training;Neuromuscular re-education;Manual techniques;Patient/family education;Passive range of motion    PT Next Visit Plan Postural re-education, use of specific PWR! Moves for exercise for postural stretching and strengthening.  REview standign PWR! Moves added to HEP.  Reinforce tips to reduce freezing episodes; work on quick stop/starts, changes of directions, stepping over obstacles (riverstones).  Review pt's shoulder ex from MD to make sure which are most appropriate to reinforce posture    Consulted and Agree with Plan of Care Patient             Patient will benefit from skilled therapeutic intervention in order to improve the following deficits and impairments:  Abnormal gait, Difficulty walking, Decreased balance, Postural dysfunction, Decreased strength, Decreased mobility  Visit Diagnosis: Abnormal posture  Unsteadiness on feet  Other abnormalities of gait and mobility     Problem List Patient Active Problem List   Diagnosis Date Noted   Healthcare maintenance 06/28/2020   Gastroesophageal reflux disease 03/07/2020   Localized  swelling of right lower extremity 04/12/2019   DOE (dyspnea on exertion) 07/15/2018   10 year risk of MI or stroke 7.5% or greater 04/12/2018   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, [place], [place], [address] Phone: [phone]   Fax:  [phone]  Name: QUINTEL MCCALLA MRN: [id number] Date of Birth: 1950/09/28

## 2021-08-12 NOTE — Patient Instructions (Signed)
  Please complete the assigned speech therapy homework prior to your next session.  

## 2021-08-12 NOTE — Patient Instructions (Addendum)
  Perform 10-20 reps, 1x/day Attention to tall posture PWR! Rock same side reach, then cross body reach PWR! Twist hold at end range briefly PWR! Step maintain tall posture with stepping/avoid lean to R

## 2021-08-14 ENCOUNTER — Ambulatory Visit: Payer: Medicare Other | Admitting: Physical Therapy

## 2021-08-14 ENCOUNTER — Other Ambulatory Visit: Payer: Self-pay

## 2021-08-14 ENCOUNTER — Other Ambulatory Visit (INDEPENDENT_AMBULATORY_CARE_PROVIDER_SITE_OTHER): Payer: Medicare Other

## 2021-08-14 DIAGNOSIS — R293 Abnormal posture: Secondary | ICD-10-CM

## 2021-08-14 DIAGNOSIS — E7800 Pure hypercholesterolemia, unspecified: Secondary | ICD-10-CM

## 2021-08-14 DIAGNOSIS — R2681 Unsteadiness on feet: Secondary | ICD-10-CM | POA: Diagnosis not present

## 2021-08-14 DIAGNOSIS — R2689 Other abnormalities of gait and mobility: Secondary | ICD-10-CM | POA: Diagnosis not present

## 2021-08-14 DIAGNOSIS — Z1159 Encounter for screening for other viral diseases: Secondary | ICD-10-CM

## 2021-08-14 DIAGNOSIS — R471 Dysarthria and anarthria: Secondary | ICD-10-CM | POA: Diagnosis not present

## 2021-08-14 DIAGNOSIS — R29818 Other symptoms and signs involving the nervous system: Secondary | ICD-10-CM | POA: Diagnosis not present

## 2021-08-14 LAB — COMPREHENSIVE METABOLIC PANEL
ALT: 13 U/L (ref 0–53)
AST: 26 U/L (ref 0–37)
Albumin: 4.3 g/dL (ref 3.5–5.2)
Alkaline Phosphatase: 53 U/L (ref 39–117)
BUN: 15 mg/dL (ref 6–23)
CO2: 29 mEq/L (ref 19–32)
Calcium: 9.1 mg/dL (ref 8.4–10.5)
Chloride: 104 mEq/L (ref 96–112)
Creatinine, Ser: 1.13 mg/dL (ref 0.40–1.50)
GFR: 66.03 mL/min (ref >60.00)
Glucose, Bld: 90 mg/dL (ref 70–99)
Potassium: 4.3 mEq/L (ref 3.5–5.1)
Sodium: 140 mEq/L (ref 135–145)
Total Bilirubin: 0.7 mg/dL (ref 0.2–1.2)
Total Protein: 6.9 g/dL (ref 6.0–8.3)

## 2021-08-14 LAB — LIPID PANEL
Cholesterol: 192 mg/dL (ref 0–200)
HDL: 51.9 mg/dL (ref >39.00)
LDL Cholesterol: 117 mg/dL — ABNORMAL HIGH (ref 0–99)
NonHDL: 139.92
Total CHOL/HDL Ratio: 4
Triglycerides: 116 mg/dL (ref 0.0–149.0)
VLDL: 23.2 mg/dL (ref 0.0–40.0)

## 2021-08-14 LAB — HEPATITIS C ANTIBODY
Hepatitis C Ab: NONREACTIVE
SIGNAL TO CUT-OFF: 0.02 (ref <1.00)

## 2021-08-14 NOTE — Patient Instructions (Signed)
Access Code: [email or web address] URL: [email or web address] Date: 08/14/2021 Prepared by: [facility]  Program Notes To help decrease freezing episodes later in the evening: In sitting:   -March in place x 5-10 reps with emphasis on stomping. -Kick leg forward 5-10 reps, alternating legs, with emphasis on Big kick  In standing: -March in place x 5-10 reps with emphasis on stomping to lift feet  -BIG step to start walking    Exercises Circular Shoulder Pendulum with Table Support - 1 x daily - 7 x weekly - 1 sets - 10 reps Doorway Pec Stretch at 60 Elevation - 1 x daily - 5 x weekly - 1 sets - 3 reps - 15-30 sec hold Seated Scapular Retraction - 1 x daily - 5 x weekly - 1 sets - 10 reps

## 2021-08-14 NOTE — Therapy (Signed)
[facility] [address], [place], [place], [address] Phone: [phone]   Fax:  [phone]  Physical Therapy Treatment  Patient Details  Name: [name] [name] MRN: [id number] Date of Birth: 1950/10/11 Referring Provider (PT): Tat, Wells Guiles   Encounter Date: 08/14/2021   PT End of Session - 08/14/21 1153     Visit Number 3    Number of Visits 17    Date for PT Re-Evaluation 10/04/21    PT Start Time 1130    PT Stop Time 1212    PT Time Calculation (min) 42 min    Activity Tolerance Patient tolerated treatment well    Behavior During Therapy [facility] for tasks assessed/performed             Past Medical History:  Diagnosis Date   Cervical lymphadenopathy    right - followed my ENT   Dystonia 1994   diagnosed in [place]   Non Hodgkin's lymphoma ([place]) 2007   diffuse- 6 cycles of chemo with R CHOP Rituxan - last dose 10/08   Parkinson disease ([place])    2015    Past Surgical History:  Procedure Laterality Date   CHOLECYSTECTOMY, LAPAROSCOPIC     EYE SURGERY     x2 as child   PORT-A-CATH REMOVAL     PORTACATH PLACEMENT     SPLENECTOMY     TONSILLECTOMY      There were no vitals filed for this visit.   Subjective Assessment - 08/14/21 1154     Subjective Been more aware of my posture.  L shoulder still bothers me a little-mostly with internal rotation and abduction.  The times I get stuck most are late at night, early in the morning-maybe has something to do with my medication.    Patient Stated Goals Pt's goals are to not fall and to move better overall; better posture.                  Neuro Re-education   Using mirror as visual cues for upright posture, Reviewed HEP given last visit:  Pt performs PWR! Moves in standing position x 10 reps   PWR! Up for improved posture-PT provides tactile cues at right trunk for elongation; able to remove after several cues   PWR! Rock for improved weighshifting x  10 reps same side rock and reach, then 10 reps reach across body with weightshifting   PWR! Twist for improved trunk rotation, cues for brief hold at end range   PWR! Step for improved step initiation, cues for upright posture with step out/in  Pt return demo understanding of technique.             [facility] Adult PT Treatment/Exercise - 08/14/21 0001       High Level Balance   High Level Balance Comments Four square step activity, box step activity, clockwise and counterclockwise, with quick stop/starts.  Cues for increased step height for foot clearance.      Neuro Re-ed    Neuro Re-ed Details  Additional tips to reduce freezing epsiodes later in the day:  seated march x 10, seated LAQ with emphasis on power to initiate movement; standing marching in place x 10 reps , emphasis on stomping.  Forward>back step and weightshift x 10 each leg.  Pt practices these with initiation of gait and feels this could help him later in the day.  Also discussed documenting more when this is happening to discuss with Dr. Carles Collet, as  his last Sinemet medication dose is 7 pm and next is 7 am.      Exercises   Exercises Other Exercises    Other Exercises  Per pt's request:  performed shoulder exercises that were given by his physican, Dr. Nani Ravens, due to c/o shoulder pain.  His main c/o shoulder pain is with internal rotation and shoulder adduction or abduction.  Performed the exercises and chose several for his continued HEP.  Shoulder pendulum 5-10 reps each direction; shoulder flexion stretch 2 x 30 sec, shoulder internal rotation stretch with towel, 2 x 30 sec (no c/o pain), doorway pec stretch 3 x 30 sec.  Seated R shoulder internal rotation x 5 reps, external rotation 5 reps (no c/o pain); shoulder extension standing, bilat x 10, scapular retraction x 10 reps bilaterally, seated.  Pt was given multiple exercises to performed (more than was performed today) and PT educated pt on performing several stretches  (see instructions from MedBridge-reprinted) to focus on postural stretching and stregnthening.                     PT Education - 08/14/21 1227     Education Details HEP additions and additional instructions/tips to reduce freezing episodes    Person(s) Educated Patient    Methods Explanation;Demonstration;Verbal cues;Handout    Comprehension Verbalized understanding;Returned demonstration              PT Short Term Goals - 08/06/21 1410       PT SHORT TERM GOAL #1   Title Pt will be independent with HEP for improved posture, strength, balance, transfers, and gait.  TARGET 09/06/2021    Time 4    Period Weeks    Status New      PT SHORT TERM GOAL #2   Title Pt will verbalize tips to reduce freezing episodes for improved gait in home and community environment.    Time 4    Period Weeks    Status New      PT SHORT TERM GOAL #3   Title Pt will improve push and release test posteriorly, to <2 steps independently regaining balance, for improved balance reactions.    Baseline 4 steps    Time 4    Period Weeks    Status New               PT Long Term Goals - 08/06/21 1413       PT LONG TERM GOAL #1   Title Pt will be independent with progression of HEP and community fitness plans upon d/c from PT for continuing to maximize gains made in PT.  TARGET 10/04/21    Time 8    Period Weeks    Status New      PT LONG TERM GOAL #2   Title Pt will improve gait velocity to at least 4 ft/sec for improved gait efficiency and safety in the community.    Baseline 3.16 ft/sec    Time 8    Period Weeks    Status New      PT LONG TERM GOAL #3   Title Pt will improve MiniBESTest score to at least 25/28 to decrease fall risk.    Baseline 22/28    Time 8    Period Weeks    Status New      PT LONG TERM GOAL #4   Title Pt will improve TUG/TUG cognitive to <10% difference for improved dual taskign with gait.    Baseline  10.06, 12.07 sec cog    Time 8    Period  Weeks    Status New                   Plan - 08/14/21 1228     Clinical Impression Statement Focused on postural re-education with review of PWR! Moves and additional tips to reduce freezing episodes with initiation of gait.  Pt tolerates well use of short burst of increased power of activities (seated march, LAQ and standing march) as a strategy to move better later in the evening.  Also looked at shoulder exercises given by MD for shoulder pain.  Pt does not c/o pain in PT sessions, but since there were a large number of exercises, pt asks PT to weigh in on most appropriate ones.  Educated pt on shoulder stretches and strengthening for optimal posture (see MedBridge) primarily.  No reported pain in PT session with exercises.    Personal Factors and Comorbidities Comorbidity 3+    Comorbidities MH:  cervical lymphadenopathy, dystonia, Non Hodgkin's lymphoma    Examination-Activity Limitations Locomotion Level;Transfers;Stand    Examination-Participation Restrictions Community Activity;Other   Hobbies   Stability/Clinical Decision Making Evolving/Moderate complexity    Rehab Potential Good    PT Frequency 2x / week    PT Duration 8 weeks   plus eval   PT Treatment/Interventions ADLs/Self Care Home Management;Gait training;Functional mobility training;Therapeutic activities;Therapeutic exercise;Balance training;Neuromuscular re-education;Manual techniques;Patient/family education;Passive range of motion    PT Next Visit Plan Continue Postural re-education, use of specific PWR! Moves for exercise for postural stretching and strengthening.  Reinforce tips to reduce freezing episodes; work on quick stop/starts, changes of directions, stepping over obstacles (riverstones).  Add four-square step to HEP.  Review questions on pt's shoulder ex from MD    Consulted and Agree with Plan of Care Patient             Patient will benefit from skilled therapeutic intervention in order to improve  the following deficits and impairments:  Abnormal gait, Difficulty walking, Decreased balance, Postural dysfunction, Decreased strength, Decreased mobility  Visit Diagnosis: Abnormal posture  Unsteadiness on feet  Other abnormalities of gait and mobility     Problem List Patient Active Problem List   Diagnosis Date Noted   Healthcare maintenance 06/28/2020   Gastroesophageal reflux disease 03/07/2020   Localized swelling of right lower extremity 04/12/2019   DOE (dyspnea on exertion) 07/15/2018   10 year risk of MI or stroke 7.5% or greater 04/12/2018   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, [place], [place], [address] Phone: [phone]   Fax:  [phone]  Name: [name] [name] MRN: [id number] Date of Birth: August 08, 1951

## 2021-08-19 ENCOUNTER — Ambulatory Visit: Payer: Medicare Other

## 2021-08-19 ENCOUNTER — Encounter: Payer: Self-pay | Admitting: Physical Therapy

## 2021-08-19 ENCOUNTER — Other Ambulatory Visit: Payer: Self-pay

## 2021-08-19 ENCOUNTER — Ambulatory Visit: Payer: Medicare Other | Admitting: Physical Therapy

## 2021-08-19 DIAGNOSIS — R2689 Other abnormalities of gait and mobility: Secondary | ICD-10-CM

## 2021-08-19 DIAGNOSIS — R2681 Unsteadiness on feet: Secondary | ICD-10-CM

## 2021-08-19 DIAGNOSIS — R471 Dysarthria and anarthria: Secondary | ICD-10-CM | POA: Diagnosis not present

## 2021-08-19 DIAGNOSIS — R293 Abnormal posture: Secondary | ICD-10-CM | POA: Diagnosis not present

## 2021-08-19 DIAGNOSIS — R29818 Other symptoms and signs involving the nervous system: Secondary | ICD-10-CM | POA: Diagnosis not present

## 2021-08-19 NOTE — Therapy (Signed)
[facility] [address], [place], [place], [address] Phone: [phone]   Fax:  [phone]  Physical Therapy Treatment  Patient Details  Name: [name] [name] MRN: [id number] Date of Birth: December 02, 1950 Referring Provider (PT): Tat, Wells Guiles   Encounter Date: 08/19/2021   PT End of Session - 08/19/21 1023     Visit Number 4    Number of Visits 17    Date for PT Re-Evaluation 10/04/21    PT Start Time 0109    PT Stop Time 1103    PT Time Calculation (min) 42 min    Activity Tolerance Patient tolerated treatment well    Behavior During Therapy [facility] for tasks assessed/performed             Past Medical History:  Diagnosis Date   Cervical lymphadenopathy    right - followed my ENT   Dystonia 1994   diagnosed in [place]   Non Hodgkin's lymphoma ([place]) 2007   diffuse- 6 cycles of chemo with R CHOP Rituxan - last dose 10/08   Parkinson disease ([place])    2015    Past Surgical History:  Procedure Laterality Date   CHOLECYSTECTOMY, LAPAROSCOPIC     EYE SURGERY     x2 as child   PORT-A-CATH REMOVAL     PORTACATH PLACEMENT     SPLENECTOMY     TONSILLECTOMY      There were no vitals filed for this visit.   Subjective Assessment - 08/19/21 1023     Subjective Had a good Thanksgiving, but I'm worn out.  Did use some of the strategies with marching later in the day and it helped.    Patient Stated Goals Pt's goals are to not fall and to move better overall; better posture.    Currently in Pain? No/denies                  Access Code: [email or web address] URL: [email or web address] Date: 08/19/2021 Prepared by: [facility]  Program Notes (reviewed HEP given last visit and added Four square step activity to HEP)  To help decrease freezing episodes later in the evening: In sitting:   -March in place x 5-10 reps with emphasis on stomping. -Kick leg forward 5-10 reps,  alternating legs, with emphasis on Big kick   In standing: -March in place x 5-10 reps with emphasis on stomping to lift feet  -BIG step to start walking  Four square step activity/Box step: Step forward, Right, Back, then Left, with BIG step for foot clearance each direction (clockwise); then reverse to step in counter clockwise direction.  Repeat 3 times each direction   Exercises  Circular Shoulder Pendulum with Table Support - 1 x daily - 7 x weekly - 1 sets - 10 reps Doorway Pec Stretch at 60 Elevation - 1 x daily - 5 x weekly - 1 sets - 3 reps - 15-30 sec hold Seated Scapular Retraction - 1 x daily - 5 x weekly - 1 sets - 10 reps              [facility] Adult PT Treatment/Exercise - 08/19/21 0001       High Level Balance   High Level Balance Activities Figure 8 turns;Marching turns;Weight-shifting turns    High Level Balance Comments Practiced wide U turns around cones, then more narrow spaces, marching, weightshifting, and step/shift turns.  Progressed to gait activities looking for objects, turning to carry them  back to mat table.  Pt prefers step and weightshift turns to initiate and change directions.                 Balance Exercises - 08/19/21 0001       Balance Exercises: Standing   Stepping Strategy Anterior;Lateral;10 reps   2 sets   Stepping Strategy Limitations Step over low orange hurdle, cues for foot clearance.                PT Education - 08/19/21 1113     Education Details Provided printed instructions that were emailed last visit    Person(s) Educated Patient    Methods Explanation;Demonstration;Handout    Comprehension Verbalized understanding;Returned demonstration              PT Short Term Goals - 08/06/21 1410       PT SHORT TERM GOAL #1   Title Pt will be independent with HEP for improved posture, strength, balance, transfers, and gait.  TARGET 09/06/2021    Time 4    Period Weeks    Status New      PT SHORT TERM  GOAL #2   Title Pt will verbalize tips to reduce freezing episodes for improved gait in home and community environment.    Time 4    Period Weeks    Status New      PT SHORT TERM GOAL #3   Title Pt will improve push and release test posteriorly, to <2 steps independently regaining balance, for improved balance reactions.    Baseline 4 steps    Time 4    Period Weeks    Status New               PT Long Term Goals - 08/06/21 1413       PT LONG TERM GOAL #1   Title Pt will be independent with progression of HEP and community fitness plans upon d/c from PT for continuing to maximize gains made in PT.  TARGET 10/04/21    Time 8    Period Weeks    Status New      PT LONG TERM GOAL #2   Title Pt will improve gait velocity to at least 4 ft/sec for improved gait efficiency and safety in the community.    Baseline 3.16 ft/sec    Time 8    Period Weeks    Status New      PT LONG TERM GOAL #3   Title Pt will improve MiniBESTest score to at least 25/28 to decrease fall risk.    Baseline 22/28    Time 8    Period Weeks    Status New      PT LONG TERM GOAL #4   Title Pt will improve TUG/TUG cognitive to <10% difference for improved dual taskign with gait.    Baseline 10.06, 12.07 sec cog    Time 8    Period Weeks    Status New                   Plan - 08/19/21 1114     Clinical Impression Statement Focused skilled PT session on strategies to help with turns today, with pt demonstrating good understanding of multiple turning techniques, but he prefers step and weigthshift turns to initiate changes of directions.  He is reporting that strategies are helpful at home (marching in place, LAQ, standing march) for initiating gait later in the evening.    Personal Factors  and Comorbidities Comorbidity 3+    Comorbidities MH:  cervical lymphadenopathy, dystonia, Non Hodgkin's lymphoma    Examination-Activity Limitations Locomotion Level;Transfers;Stand     Examination-Participation Restrictions Community Activity;Other   Hobbies   Stability/Clinical Decision Making Evolving/Moderate complexity    Rehab Potential Good    PT Frequency 2x / week    PT Duration 8 weeks   plus eval   PT Treatment/Interventions ADLs/Self Care Home Management;Gait training;Functional mobility training;Therapeutic activities;Therapeutic exercise;Balance training;Neuromuscular re-education;Manual techniques;Patient/family education;Passive range of motion    PT Next Visit Plan Continue Postural re-education, use of specific PWR! Moves for exercise for postural stretching and strengthening.  Reinforce tips to reduce freezing episodes; work on quick stop/starts, changes of directions, turns, stepping over obstacles (hurdles/riverstones).    Consulted and Agree with Plan of Care Patient             Patient will benefit from skilled therapeutic intervention in order to improve the following deficits and impairments:  Abnormal gait, Difficulty walking, Decreased balance, Postural dysfunction, Decreased strength, Decreased mobility  Visit Diagnosis: Unsteadiness on feet  Other abnormalities of gait and mobility     Problem List Patient Active Problem List   Diagnosis Date Noted   Healthcare maintenance 06/28/2020   Gastroesophageal reflux disease 03/07/2020   Localized swelling of right lower extremity 04/12/2019   DOE (dyspnea on exertion) 07/15/2018   10 year risk of MI or stroke 7.5% or greater 04/12/2018   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 [place], [place], [address] Phone: [phone]   Fax:  [phone]  Name: JAKALEB PAYER MRN: [id number] Date of Birth: 07-14-1951

## 2021-08-19 NOTE — Patient Instructions (Signed)
Access Code: [email or web address] URL: [email or web address] Date: 08/19/2021 Prepared by: [facility]  Program Notes  To help decrease freezing episodes later in the evening: In sitting:   -March in place x 5-10 reps with emphasis on stomping. -Kick leg forward 5-10 reps, alternating legs, with emphasis on Big kick   In standing: -March in place x 5-10 reps with emphasis on stomping to lift feet  -BIG step to start walking  Four square step activity/Box step: Step forward, Right, Back, then Left, with BIG step for foot clearance each direction (clockwise); then reverse to step in counter clockwise direction.  Repeat 3 times each direction   Exercises  Circular Shoulder Pendulum with Table Support - 1 x daily - 7 x weekly - 1 sets - 10 reps Doorway Pec Stretch at 60 Elevation - 1 x daily - 5 x weekly - 1 sets - 3 reps - 15-30 sec hold Seated Scapular Retraction - 1 x daily - 5 x weekly - 1 sets - 10 reps

## 2021-08-19 NOTE — Therapy (Signed)
[facility] [address] [place], [place], [address] Phone: [phone]   Fax:  [phone]  Speech Language Pathology Treatment  Patient Details  Name: [name] [name] MRN: [id number] Date of Birth: 05-Apr-1951 Referring Provider (SLP): Alonza Bogus, DO   Encounter Date: 08/19/2021   End of Session - 08/19/21 1152     Visit Number 4    Number of Visits 17    Date for SLP Re-Evaluation 10/14/21   extended one week due to holiday   SLP Start Time 1102    SLP Stop Time  1145    SLP Time Calculation (min) 43 min    Activity Tolerance Patient tolerated treatment well             Past Medical History:  Diagnosis Date   Cervical lymphadenopathy    right - followed my ENT   Dystonia 1994   diagnosed in [place]   Non Hodgkin's lymphoma ([place]) 2007   diffuse- 6 cycles of chemo with R CHOP Rituxan - last dose 10/08   Parkinson disease ([place])    2015    Past Surgical History:  Procedure Laterality Date   CHOLECYSTECTOMY, LAPAROSCOPIC     EYE SURGERY     x2 as child   PORT-A-CATH REMOVAL     PORTACATH PLACEMENT     SPLENECTOMY     TONSILLECTOMY      There were no vitals filed for this visit.   Subjective Assessment - 08/19/21 1107     Subjective "I had a lot of fun doing (homework) with them."    Currently in Pain? 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 He took PD meds prior to ST session. Loud /a/ completed today with low 90s dB, and everyday sentences with average low 80s dB. In conversations of 10  minutes x2 pt self corrected x5, and average was in upper 60s dB with rare min A. SLP noted pt's average decr'd more in the last 5 minutes due to fatigue. Pt acknowledged this was the case.      Assessment / Recommendations / Plan   Plan Continue with current plan of care      Progression Toward Goals   Progression toward goals Progressing toward goals                SLP Short Term Goals - 08/19/21 1152       SLP SHORT TERM GOAL #1   Title Pt will achieve 90dB average on loud /a/ over 3 sessions    Baseline 08-08-21, 08-12-21    Period Weeks    Status Achieved    Target Date 08/30/21      SLP SHORT TERM GOAL #2   Title Pt will mainitain average of 70dB during sentence level responses with rare min cues over two sessions    Baseline 08-19-21    Time 4    Period Weeks    Status On-going    Target Date 08/30/21  SLP SHORT TERM GOAL #3   Title In 5 minutes simple conversation pt will achieve average speech volume of low 70s dB over two sessions    Time 4    Period Weeks    Status On-going    Target Date 08/30/21      SLP SHORT TERM GOAL #4   Title Pt will use abdominal breathing 75% of the time in 5 minutes simple conversation in 2 sessions    Baseline 08-19-21    Time 4    Period Weeks    Status On-going    Target Date 08/30/21              SLP Long Term Goals - 08/19/21 1153       SLP LONG TERM GOAL #1   Title Pt will maintain average of low 70s dB over 10 minute simple conversation with rare min A over three sessions    Time 9    Period Weeks    Status On-going    Target Date 10/04/21      SLP LONG TERM GOAL #2   Title Pt will be audible over 15 minute conversation in noisy environment/outside of therapy room with min rare A x 2 sessions    Time 9    Period Weeks    Status On-going    Target Date 10/04/21      SLP LONG TERM GOAL #3   Title Pt will report less frequent requests to repeat himself outside of [place] room over 2 visits.    Time 9     Period Weeks    Status On-going    Target Date 10/04/21      SLP LONG TERM GOAL #4   Title pt will use abdominal breathing 75% of the time in 10 minutes simple conversation in three sessions    Time 9    Period Weeks    Status On-going    Target Date 10/04/21              Plan - 08/19/21 1152     Clinical Impression Statement Pt presents today with improving mod hypokinetic dysarthria due to parkinson's disease (PD), with both mostly abdominal breathing. Pt reports WNL eating/drinking, denying overt s/s aspiration during meals, however pt may require objective swallow eval during this therapy course (MBSS, FEES). Pt is slowly progressing through a heirarchy to achieve louder conversational speech. SLP believes pt will cont to benefit from skilled ST targeting incr'd volume and greater usage of abdomoinal breathing in order to improve communicative effectiveness.    Speech Therapy Frequency 2x / week    Duration --   9 weeks   Treatment/Interventions SLP instruction and feedback;Compensatory strategies;Patient/family education;Functional tasks;Multimodal communcation approach;Internal/external aids;Environmental controls    Potential to [place] provided today - /a/ and everyday sentences    Consulted and Agree with Plan of Care Patient             Patient will benefit from skilled therapeutic intervention in order to improve the following deficits and impairments:   Dysarthria and anarthria    Problem List Patient Active Problem List   Diagnosis Date Noted   Healthcare maintenance 06/28/2020   Gastroesophageal reflux disease 03/07/2020   Localized swelling of right lower extremity 04/12/2019   DOE (dyspnea on exertion) 07/15/2018   10 year risk of MI or stroke 7.5% or greater 04/12/2018   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, [place], [place], [address] Phone: [phone]   Fax:  [phone]   Name: [name] [name] MRN: [id number] Date of Birth: July 10, 1951

## 2021-08-20 ENCOUNTER — Encounter: Payer: Self-pay | Admitting: Physical Therapy

## 2021-08-20 ENCOUNTER — Ambulatory Visit: Payer: Medicare Other

## 2021-08-20 ENCOUNTER — Ambulatory Visit: Payer: Medicare Other | Admitting: Physical Therapy

## 2021-08-20 DIAGNOSIS — R471 Dysarthria and anarthria: Secondary | ICD-10-CM | POA: Diagnosis not present

## 2021-08-20 DIAGNOSIS — R293 Abnormal posture: Secondary | ICD-10-CM

## 2021-08-20 DIAGNOSIS — R2689 Other abnormalities of gait and mobility: Secondary | ICD-10-CM

## 2021-08-20 DIAGNOSIS — R29818 Other symptoms and signs involving the nervous system: Secondary | ICD-10-CM | POA: Diagnosis not present

## 2021-08-20 DIAGNOSIS — R2681 Unsteadiness on feet: Secondary | ICD-10-CM

## 2021-08-20 NOTE — Therapy (Signed)
[facility] [address], [place], [place], [address] Phone: [phone]   Fax:  [phone]  Speech Language Pathology Treatment  Patient Details  Name: [name] [name] MRN: [id number] Date of Birth: 06-Jun-1951 Referring Provider (SLP): Alonza Bogus, DO   Encounter Date: 08/20/2021   End of Session - 08/20/21 1006     Visit Number 5    Number of Visits 17    Date for SLP Re-Evaluation 10/14/21   extended one week due to holiday   SLP Start Time 0847    SLP Stop Time  0930    SLP Time Calculation (min) 43 min    Activity Tolerance Patient tolerated treatment well             Past Medical History:  Diagnosis Date   Cervical lymphadenopathy    right - followed my ENT   Dystonia 1994   diagnosed in [place]   Non Hodgkin's lymphoma ([place]) 2007   diffuse- 6 cycles of chemo with R CHOP Rituxan - last dose 10/08   Parkinson disease ([place])    2015    Past Surgical History:  Procedure Laterality Date   CHOLECYSTECTOMY, LAPAROSCOPIC     EYE SURGERY     x2 as child   PORT-A-CATH REMOVAL     PORTACATH PLACEMENT     SPLENECTOMY     TONSILLECTOMY      There were no vitals filed for this visit.   Subjective Assessment - 08/20/21 0851     Subjective "I'm doing fantastic."    Currently in Pain? No/denies                   ADULT SLP TREATMENT - 08/20/21 0851       General Information   Behavior/Cognition Alert;Cooperative;Pleasant mood      Treatment Provided   Treatment provided Cognitive-Linquistic      Cognitive-Linquistic Treatment   Treatment focused on Dysarthria    Skilled Treatment [name] [name] spoke in louder speech volume in the lobby and on the way back to the [place] room today. Loud /a/ completed today initially with upper 80s dB but last 4 trials were with low 90s dB, and everyday sentences with average low 80s dB. In 3-4 sentence responses pt generated average mid 60s dB with rare min A from SLP for  loudness. SLP had pt perform two loud /a/ when 3-4 sentence productions were in lower-mid 60s dB and this assisted pt in incr'ing his volume in subsequent productions. However, overall average was in mid-60s dB. Like yesterday, SLP noted pt's average volume decr'd more in the last 5 minutes of session due to fatigue. Pt was strongly encouraged to produce 5 reps of loud /a/ and practice his everyday sentences BID.      Assessment / Recommendations / Plan   Plan Continue with current plan of care      Progression Toward Goals   Progression toward goals Progressing toward goals                SLP Short Term Goals - 08/20/21 1006       SLP SHORT TERM GOAL #1   Title Pt will achieve 90dB average on loud /a/ over 3 sessions    Baseline 08-08-21, 08-12-21    Period Weeks    Status Achieved    Target Date 08/30/21      SLP SHORT TERM GOAL #2   Title Pt will mainitain average of 70dB during  sentence level responses with rare min cues over two sessions    Baseline 08-19-21    Time 4    Period Weeks    Status Achieved    Target Date 08/30/21      SLP SHORT TERM GOAL #3   Title In 5 minutes simple conversation pt will achieve average speech volume of low 70s dB over two sessions    Time 4    Period Weeks    Status On-going    Target Date 08/30/21      SLP SHORT TERM GOAL #4   Title Pt will use abdominal breathing 75% of the time in 5 minutes simple conversation in 2 sessions    Baseline 08-19-21    Time 4    Period Weeks    Status On-going    Target Date 08/30/21              SLP Long Term Goals - 08/20/21 1007       SLP LONG TERM GOAL #1   Title Pt will maintain average of low 70s dB over 10 minute simple conversation with rare min A over three sessions    Time 9    Period Weeks    Status On-going      SLP LONG TERM GOAL #2   Title Pt will be audible over 15 minute conversation in noisy environment/outside of therapy room with min rare A x 2 sessions    Time 9     Period Weeks    Status On-going      SLP LONG TERM GOAL #3   Title Pt will report less frequent requests to repeat himself outside of [place] room over 2 visits.    Time 9    Period Weeks    Status On-going      SLP LONG TERM GOAL #4   Title pt will use abdominal breathing 75% of the time in 10 minutes simple conversation in three sessions    Time 9    Period Weeks    Status On-going              Plan - 08/20/21 1006     Clinical Impression Statement Pt presents today with improving mod hypokinetic dysarthria due to parkinson's disease (PD), with mostly abdominal breathing. Pt reports WNL eating/drinking, denying overt s/s aspiration during meals, however pt may require objective swallow eval during this therapy course (MBSS, FEES). Pt is slowly progressing through a heirarchy to achieve louder conversational speech. SLP believes pt will cont to benefit from skilled ST targeting incr'd volume and greater usage of abdomoinal breathing in order to improve communicative effectiveness.    Speech Therapy Frequency 2x / week    Duration --   9 weeks   Treatment/Interventions SLP instruction and feedback;Compensatory strategies;Patient/family education;Functional tasks;Multimodal communcation approach;Internal/external aids;Environmental controls    Potential to [place] provided today - /a/ and everyday sentences    Consulted and Agree with Plan of Care Patient             Patient will benefit from skilled therapeutic intervention in order to improve the following deficits and impairments:   Dysarthria and anarthria    Problem List Patient Active Problem List   Diagnosis Date Noted   Healthcare maintenance 06/28/2020   Gastroesophageal reflux disease 03/07/2020   Localized swelling of right lower extremity 04/12/2019   DOE (dyspnea on exertion) 07/15/2018   10 year risk of MI or  stroke 7.5% or greater 04/12/2018   Post-splenectomy  04/02/2015   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

## 2021-08-20 NOTE — Therapy (Signed)
[facility] [address], [place], [place], [address] Phone: [phone]   Fax:  [phone]  Physical Therapy Treatment  Patient Details  Name: [name] [name] MRN: [id number] Date of Birth: October 15, 1950 Referring Provider (PT): Tat, Wells Guiles   Encounter Date: 08/20/2021   PT End of Session - 08/20/21 0937     Visit Number 5    Number of Visits 17    Date for PT Re-Evaluation 10/04/21    PT Start Time 0935    PT Stop Time 1015    PT Time Calculation (min) 40 min    Equipment Utilized During Treatment Gait belt    Activity Tolerance Patient tolerated treatment well    Behavior During Therapy WFL for tasks assessed/performed             Past Medical History:  Diagnosis Date   Cervical lymphadenopathy    right - followed my ENT   Dystonia 1994   diagnosed in [place]   Non Hodgkin's lymphoma ([place]) 2007   diffuse- 6 cycles of chemo with R CHOP Rituxan - last dose 10/08   Parkinson disease ([place])    2015    Past Surgical History:  Procedure Laterality Date   CHOLECYSTECTOMY, LAPAROSCOPIC     EYE SURGERY     x2 as child   PORT-A-CATH REMOVAL     PORTACATH PLACEMENT     SPLENECTOMY     TONSILLECTOMY      There were no vitals filed for this visit.   Subjective Assessment - 08/20/21 0932     Subjective Tired from yesterday. Went to bed late last night.    Patient Stated Goals Pt's goals are to not fall and to move better overall; better posture.    Currently in Pain? No/denies                               [facility] Adult PT Treatment/Exercise - 08/20/21 0001       Exercises   Exercises Other Exercises    Other Exercises  Seated hamstring stretch, foot propped on bolster, 3 reps 15 sec; then standing gastroc stretch, 3 x 15 sec foot propped at 4" block            Postural Re-ed:  Seated PWR! UP>Sit to stand>Standing PWR! Up x 5 reps, pt able to self-correct posture in standing  positions.     Balance Exercises - 08/20/21 0001       Balance Exercises: Standing   Wall Bumps Hip;Eyes opened;10 reps;Limitations   2nd set on Airex   Wall Bumps Limitations in parallel bars    Stepping Strategy Anterior;Posterior;Lateral;Foam/compliant surface;10 reps;Limitations    Stepping Strategy Limitations Standing on Airex, progressing to forward<>back step and weigthshift x 10 reps.  Cues for increased step length for foot clearance.  Lateral step and weightshift progressing to side step off and on Airex, R and L x 10 reps each intermittend UE support    Rockerboard Anterior/posterior;Head turns;EO;Limitations   Head turns/nods x 5 reps   Rockerboard Limitations Ankle/hip strategy x10 reps each, with UE support, progressed to alt UE lifts>arm swing    Heel Raises Both;10 reps;Limitations   2 sets; 2nd set on Airex   Heel Raises Limitations 3 sec hold    Toe Raise Both;10 reps;Limitations   2 sets; 2nd set on Airex   Toe Raise Limitations 3 sec hold  PT Short Term Goals - 08/06/21 1410       PT SHORT TERM GOAL #1   Title Pt will be independent with HEP for improved posture, strength, balance, transfers, and gait.  TARGET 09/06/2021    Time 4    Period Weeks    Status New      PT SHORT TERM GOAL #2   Title Pt will verbalize tips to reduce freezing episodes for improved gait in home and community environment.    Time 4    Period Weeks    Status New      PT SHORT TERM GOAL #3   Title Pt will improve push and release test posteriorly, to <2 steps independently regaining balance, for improved balance reactions.    Baseline 4 steps    Time 4    Period Weeks    Status New               PT Long Term Goals - 08/06/21 1413       PT LONG TERM GOAL #1   Title Pt will be independent with progression of HEP and community fitness plans upon d/c from PT for continuing to maximize gains made in PT.  TARGET 10/04/21    Time 8    Period Weeks     Status New      PT LONG TERM GOAL #2   Title Pt will improve gait velocity to at least 4 ft/sec for improved gait efficiency and safety in the community.    Baseline 3.16 ft/sec    Time 8    Period Weeks    Status New      PT LONG TERM GOAL #3   Title Pt will improve MiniBESTest score to at least 25/28 to decrease fall risk.    Baseline 22/28    Time 8    Period Weeks    Status New      PT LONG TERM GOAL #4   Title Pt will improve TUG/TUG cognitive to <10% difference for improved dual taskign with gait.    Baseline 10.06, 12.07 sec cog    Time 8    Period Weeks    Status New                   Plan - 08/20/21 1009     Clinical Impression Statement Skilled PT session today focused on balance strategy work on solid, compliant, and rockerboard surfaces.  On rockerboard, pt has several episodes without UE support where he has posterior LOB and needs UE support to regain balance.  He continues to report that strategies to reduce festination are helping at home; he demonstrates use of rocking and backward step and weightshift to help initiate gait with transitions in PT session.  Continues to need reinforcement cues for posture.    Personal Factors and Comorbidities Comorbidity 3+    Comorbidities MH:  cervical lymphadenopathy, dystonia, Non Hodgkin's lymphoma    Examination-Activity Limitations Locomotion Level;Transfers;Stand    Examination-Participation Restrictions Community Activity;Other   Hobbies   Stability/Clinical Decision Making Evolving/Moderate complexity    Rehab Potential Good    PT Frequency 2x / week    PT Duration 8 weeks   plus eval   PT Treatment/Interventions ADLs/Self Care Home Management;Gait training;Functional mobility training;Therapeutic activities;Therapeutic exercise;Balance training;Neuromuscular re-education;Manual techniques;Patient/family education;Passive range of motion    PT Next Visit Plan Continue Postural re-education, use of specific PWR!  Moves for exercise for postural stretching and strengthening.  Reinforce tips to  reduce freezing episodes; work on quick stop/starts, changes of directions, turns, stepping over obstacles (hurdles/riverstones).  Balance strategy work on varied surfaces.    Consulted and Agree with Plan of Care Patient             Patient will benefit from skilled therapeutic intervention in order to improve the following deficits and impairments:  Abnormal gait, Difficulty walking, Decreased balance, Postural dysfunction, Decreased strength, Decreased mobility  Visit Diagnosis: Unsteadiness on feet  Abnormal posture  Other abnormalities of gait and mobility     Problem List Patient Active Problem List   Diagnosis Date Noted   Healthcare maintenance 06/28/2020   Gastroesophageal reflux disease 03/07/2020   Localized swelling of right lower extremity 04/12/2019   DOE (dyspnea on exertion) 07/15/2018   10 year risk of MI or stroke 7.5% or greater 04/12/2018   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 [place], [place], [address] Phone: [phone]   Fax:  [phone]  Name: [name] [name] MRN: [id number] Date of Birth: [birth year]

## 2021-08-21 ENCOUNTER — Ambulatory Visit: Payer: Medicare Other | Admitting: Physical Therapy

## 2021-08-23 ENCOUNTER — Other Ambulatory Visit (HOSPITAL_BASED_OUTPATIENT_CLINIC_OR_DEPARTMENT_OTHER): Payer: Self-pay

## 2021-08-23 DIAGNOSIS — Z23 Encounter for immunization: Secondary | ICD-10-CM | POA: Diagnosis not present

## 2021-08-23 MED ORDER — PFIZER COVID-19 VAC BIVALENT 30 MCG/0.3ML IM SUSP
INTRAMUSCULAR | 0 refills | Status: DC
  Filled 2021-08-23: qty 0.3, 1d supply, fill #0

## 2021-08-24 ENCOUNTER — Other Ambulatory Visit (HOSPITAL_BASED_OUTPATIENT_CLINIC_OR_DEPARTMENT_OTHER): Payer: Self-pay

## 2021-08-26 ENCOUNTER — Ambulatory Visit: Payer: Medicare Other

## 2021-08-26 ENCOUNTER — Other Ambulatory Visit: Payer: Self-pay

## 2021-08-26 ENCOUNTER — Ambulatory Visit: Payer: Medicare Other | Attending: Neurology | Admitting: Physical Therapy

## 2021-08-26 ENCOUNTER — Encounter: Payer: Self-pay | Admitting: Physical Therapy

## 2021-08-26 DIAGNOSIS — R29818 Other symptoms and signs involving the nervous system: Secondary | ICD-10-CM | POA: Diagnosis not present

## 2021-08-26 DIAGNOSIS — R471 Dysarthria and anarthria: Secondary | ICD-10-CM | POA: Insufficient documentation

## 2021-08-26 DIAGNOSIS — R293 Abnormal posture: Secondary | ICD-10-CM | POA: Diagnosis not present

## 2021-08-26 DIAGNOSIS — R2681 Unsteadiness on feet: Secondary | ICD-10-CM | POA: Diagnosis not present

## 2021-08-26 DIAGNOSIS — R2689 Other abnormalities of gait and mobility: Secondary | ICD-10-CM | POA: Insufficient documentation

## 2021-08-26 NOTE — Therapy (Signed)
[facility] [place], [place], [place], [address] Phone: [phone]   Fax:  [phone]  Physical Therapy Treatment  Patient Details  Name: [name] [name] MRN: [id number] Date of Birth: June 14, 1951 Referring Provider (PT): Tat, Wells Guiles   Encounter Date: 08/26/2021   PT End of Session - 08/26/21 1024     Visit Number 6    Number of Visits 17    Date for PT Re-Evaluation 10/04/21    PT Start Time 1026   pt on phone and late starting   PT Stop Time 1104    PT Time Calculation (min) 38 min    Equipment Utilized During Treatment Gait belt    Activity Tolerance Patient tolerated treatment well    Behavior During Therapy WFL for tasks assessed/performed             Past Medical History:  Diagnosis Date   Cervical lymphadenopathy    right - followed my ENT   Dystonia 1994   diagnosed in [place]   Non Hodgkin's lymphoma ([place]) 2007   diffuse- 6 cycles of chemo with R CHOP Rituxan - last dose 10/08   Parkinson disease ([place])    2015    Past Surgical History:  Procedure Laterality Date   CHOLECYSTECTOMY, LAPAROSCOPIC     EYE SURGERY     x2 as child   PORT-A-CATH REMOVAL     PORTACATH PLACEMENT     SPLENECTOMY     TONSILLECTOMY      There were no vitals filed for this visit.   Subjective Assessment - 08/26/21 1023     Subjective Been doing the exercises over the weekend.  Was very tired by the end of the week last week.    Patient Stated Goals Pt's goals are to not fall and to move better overall; better posture.    Currently in Pain? No/denies                               [facility] Adult PT Treatment/Exercise - 08/26/21 0001       Neuro Re-ed    Neuro Re-ed Details  PWR! Moves standing on Airex:  PWR! Up x 10, PWR! Rock x 10, in front of mirror for visual cues for posture.  PT provides min guard for stability at hips      Exercises   Exercises Other Exercises;Knee/Hip    Other Exercises   Seated posture stretch exercises, R trunk stretch, x 5 reps; R and L trunk rotation x 5 reps each.      Knee/Hip Exercises: Aerobic   Nustep 4 extremities x 4 minutes, Level 3>4, for lower extremity strengthening and flexibility.                 Balance Exercises - 08/26/21 0001       Balance Exercises: Standing   Stepping Strategy Anterior;Posterior;Lateral;Foam/compliant surface;10 reps;Limitations    Stepping Strategy Limitations Standing on Airex    Step Over Hurdles / Cones Forward, alternating legs x 10 reps over low hurdles; side, alternating legs x 10 reps over low hurdles; back x 10 reps, alternating legs.  With forward step overs, cues to avoid circumduction    Other Standing Exercises Variable direction step and weigthshift/return to midline for balance recovery:  anterior/posteiror/lateral directions, then with theraband perturbantions at waist for posteiror and lateral directions.    Other Standing Exercises Comments Four square step activity,  stepping over walking poles, clockwise/counterclockwise x 5 reps each; Progress to added UE motions with stepping, x 7 reps each directions                  PT Short Term Goals - 08/06/21 1410       PT SHORT TERM GOAL #1   Title Pt will be independent with HEP for improved posture, strength, balance, transfers, and gait.  TARGET 09/06/2021    Time 4    Period Weeks    Status New      PT SHORT TERM GOAL #2   Title Pt will verbalize tips to reduce freezing episodes for improved gait in home and community environment.    Time 4    Period Weeks    Status New      PT SHORT TERM GOAL #3   Title Pt will improve push and release test posteriorly, to <2 steps independently regaining balance, for improved balance reactions.    Baseline 4 steps    Time 4    Period Weeks    Status New               PT Long Term Goals - 08/06/21 1413       PT LONG TERM GOAL #1   Title Pt will be independent with progression of  HEP and community fitness plans upon d/c from PT for continuing to maximize gains made in PT.  TARGET 10/04/21    Time 8    Period Weeks    Status New      PT LONG TERM GOAL #2   Title Pt will improve gait velocity to at least 4 ft/sec for improved gait efficiency and safety in the community.    Baseline 3.16 ft/sec    Time 8    Period Weeks    Status New      PT LONG TERM GOAL #3   Title Pt will improve MiniBESTest score to at least 25/28 to decrease fall risk.    Baseline 22/28    Time 8    Period Weeks    Status New      PT LONG TERM GOAL #4   Title Pt will improve TUG/TUG cognitive to <10% difference for improved dual taskign with gait.    Baseline 10.06, 12.07 sec cog    Time 8    Period Weeks    Status New                   Plan - 08/26/21 1024     Clinical Impression Statement Continued skilled PT work on balance strategies, foot clearance, and posture.  Worked on four square step activity as well as posture exercises in standing with dual tasking activities, with occasional cues for posture and step length.  He will continue to beneift from skilled PT to further address balance, posture, gait towards goals.    Personal Factors and Comorbidities Comorbidity 3+    Comorbidities MH:  cervical lymphadenopathy, dystonia, Non Hodgkin's lymphoma    Examination-Activity Limitations Locomotion Level;Transfers;Stand    Examination-Participation Restrictions Community Activity;Other   Hobbies   Stability/Clinical Decision Making Evolving/Moderate complexity    Rehab Potential Good    PT Frequency 2x / week    PT Duration 8 weeks   plus eval   PT Treatment/Interventions ADLs/Self Care Home Management;Gait training;Functional mobility training;Therapeutic activities;Therapeutic exercise;Balance training;Neuromuscular re-education;Manual techniques;Patient/family education;Passive range of motion    PT Next Visit Plan Continue Postural re-education, use of specific  PWR!  Moves for exercise for postural stretching and strengthening.  Reinforce tips to reduce freezing episodes; work on quick stop/starts, changes of directions, turns, stepping over obstacles (hurdles/riverstones).  Balance strategy work on varied surfaces.  Dual tasking activities with balance, gait.    Consulted and Agree with Plan of Care Patient             Patient will benefit from skilled therapeutic intervention in order to improve the following deficits and impairments:  Abnormal gait, Difficulty walking, Decreased balance, Postural dysfunction, Decreased strength, Decreased mobility  Visit Diagnosis: Unsteadiness on feet  Abnormal posture  Other abnormalities of gait and mobility     Problem List Patient Active Problem List   Diagnosis Date Noted   Healthcare maintenance 06/28/2020   Gastroesophageal reflux disease 03/07/2020   Localized swelling of right lower extremity 04/12/2019   DOE (dyspnea on exertion) 07/15/2018   10 year risk of MI or stroke 7.5% or greater 04/12/2018   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 [place], [place], [address] Phone: [phone]   Fax:  [phone]  Name: [name] [name] MRN: [id number] Date of Birth: 11-19-1950

## 2021-08-26 NOTE — Therapy (Signed)
[facility] [address], [place], [place], [address] Phone: [phone]   Fax:  [phone]  Speech Language Pathology Treatment  Patient Details  Name: [name] [name] MRN: [id number] Date of Birth: 05-23-1951 Referring Provider (SLP): Alonza Bogus, DO   Encounter Date: 08/26/2021   End of Session - 08/26/21 1712     Visit Number 6    Number of Visits 17    Date for SLP Re-Evaluation 10/14/21   extended one week due to holiday   SLP Start Time 1105    SLP Stop Time  1145    SLP Time Calculation (min) 40 min    Activity Tolerance Patient tolerated treatment well             Past Medical History:  Diagnosis Date   Cervical lymphadenopathy    right - followed my ENT   Dystonia 1994   diagnosed in [place]   Non Hodgkin's lymphoma ([place]) 2007   diffuse- 6 cycles of chemo with R CHOP Rituxan - last dose 10/08   Parkinson disease ([place])    2015    Past Surgical History:  Procedure Laterality Date   CHOLECYSTECTOMY, LAPAROSCOPIC     EYE SURGERY     x2 as child   PORT-A-CATH REMOVAL     PORTACATH PLACEMENT     SPLENECTOMY     TONSILLECTOMY      There were no vitals filed for this visit.   Subjective Assessment - 08/26/21 1116     Subjective States he cont to have positive compliments this weekend at church about his volume.    Currently in Pain? No/denies                   ADULT SLP TREATMENT - 08/26/21 1116       General Information   Behavior/Cognition Alert;Cooperative;Pleasant mood      Treatment Provided   Treatment provided Cognitive-Linquistic      Cognitive-Linquistic Treatment   Treatment focused on Dysarthria    Skilled Treatment [name] [name] was speaking in generally a louder volume in the gym during his PT session. This SLP heard 100% of his speech with the ST door open. In 5 minutes simple to mod ocmplex conversation prior to starting formal ST tasks, pt spoke with upper 60s dB average. Loud  /a/ used to calibrate pt's volume in conversation - average low 90s dB. Everyday sentences communicated with low 80s dB. SLP targeted short conversational segments of 3-6 minutes and asked pt to assess his success with WNL volume after each. Pt never got to average low 70s in any of these segments however had upper 60s consistently.      Assessment / Recommendations / Plan   Plan Continue with current plan of care   pt  needs to be scheduled out 3 more weeks               SLP Short Term Goals - 08/26/21 1713       SLP SHORT TERM GOAL #1   Title Pt will achieve 90dB average on loud /a/ over 3 sessions    Baseline 08-08-21, 08-12-21    Period Weeks    Status Achieved    Target Date 08/30/21      SLP SHORT TERM GOAL #2   Title Pt will mainitain average of 70dB during sentence level responses with rare min cues over two sessions    Baseline 08-19-21    Time 4  Period Weeks    Status Achieved    Target Date 08/30/21      SLP SHORT TERM GOAL #3   Title In 5 minutes simple conversation pt will achieve average speech volume of low 70s dB over two sessions    Time 4    Period Weeks    Status On-going    Target Date 08/30/21      SLP SHORT TERM GOAL #4   Title Pt will use abdominal breathing 75% of the time in 5 minutes simple conversation in 2 sessions    Baseline 08-19-21    Time 4    Period Weeks    Status On-going    Target Date 08/30/21              SLP Long Term Goals - 08/26/21 1713       SLP LONG TERM GOAL #1   Title Pt will maintain average of low 70s dB over 10 minute simple conversation with rare min A over three sessions    Time 9    Period Weeks    Status On-going    Target Date 10/04/21      SLP LONG TERM GOAL #2   Title Pt will be audible over 15 minute conversation in noisy environment/outside of therapy room with min rare A x 2 sessions    Time 9    Period Weeks    Status On-going    Target Date 10/04/21      SLP LONG TERM GOAL #3   Title  Pt will report less frequent requests to repeat himself outside of [place] room over 2 visits.    Baseline 08-26-21    Time 9    Period Weeks    Status On-going    Target Date 10/04/21      SLP LONG TERM GOAL #4   Title pt will use abdominal breathing 75% of the time in 10 minutes simple conversation in three sessions    Baseline 08-26-21    Time 9    Period Weeks    Status On-going    Target Date 10/04/21              Plan - 08/26/21 1713     Clinical Impression Statement Pt presents today with improving mod hypokinetic dysarthria due to parkinson's disease (PD), with mostly abdominal breathing. Pt reports WNL eating/drinking, denying overt s/s aspiration during meals, however pt may require objective swallow eval during this therapy course (MBSS, FEES). Pt cont to progress through a heirarchy to achieve louder conversational speech. He cont to practice as directed each day. SLP believes pt will cont to benefit from skilled ST targeting incr'd volume and greater usage of abdomoinal breathing in order to improve communicative effectiveness.    Speech Therapy Frequency 2x / week    Duration --   9 weeks   Treatment/Interventions SLP instruction and feedback;Compensatory strategies;Patient/family education;Functional tasks;Multimodal communcation approach;Internal/external aids;Environmental controls    Potential to [place] provided today - /a/ and everyday sentences    Consulted and Agree with Plan of Care Patient             Patient will benefit from skilled therapeutic intervention in order to improve the following deficits and impairments:   Dysarthria and anarthria    Problem List Patient Active Problem List   Diagnosis Date Noted   Healthcare maintenance 06/28/2020   Gastroesophageal reflux disease 03/07/2020   Localized swelling of  right lower extremity 04/12/2019   DOE (dyspnea on exertion) 07/15/2018   10 year risk of MI or stroke  7.5% or greater 04/12/2018   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, [place], [place], [address] Phone: [phone]   Fax:  [phone]   Name: SAAMIR ARMSTRONG MRN: [id number] Date of Birth: 11/14/1950

## 2021-08-29 ENCOUNTER — Ambulatory Visit: Payer: Medicare Other

## 2021-08-29 ENCOUNTER — Other Ambulatory Visit: Payer: Self-pay

## 2021-08-29 ENCOUNTER — Ambulatory Visit: Payer: Medicare Other | Admitting: Physical Therapy

## 2021-08-29 ENCOUNTER — Encounter: Payer: Self-pay | Admitting: Physical Therapy

## 2021-08-29 DIAGNOSIS — R29818 Other symptoms and signs involving the nervous system: Secondary | ICD-10-CM | POA: Diagnosis not present

## 2021-08-29 DIAGNOSIS — R2681 Unsteadiness on feet: Secondary | ICD-10-CM | POA: Diagnosis not present

## 2021-08-29 DIAGNOSIS — R471 Dysarthria and anarthria: Secondary | ICD-10-CM | POA: Diagnosis not present

## 2021-08-29 DIAGNOSIS — R2689 Other abnormalities of gait and mobility: Secondary | ICD-10-CM

## 2021-08-29 DIAGNOSIS — R293 Abnormal posture: Secondary | ICD-10-CM | POA: Diagnosis not present

## 2021-08-29 NOTE — Therapy (Signed)
[facility] [address], [place], [place], [address] Phone: [phone]   Fax:  [phone]  Speech Language Pathology Treatment  Patient Details  Name: [name] [name] MRN: [id number] Date of Birth: 29-May-1951 Referring Provider (SLP): Alonza Bogus, DO   Encounter Date: 08/29/2021   End of Session - 08/29/21 1233     Visit Number 7    Number of Visits 17    Date for SLP Re-Evaluation 10/14/21   extended one week due to holiday   SLP Start Time 1106    SLP Stop Time  1145    SLP Time Calculation (min) 39 min    Activity Tolerance Patient tolerated treatment well             Past Medical History:  Diagnosis Date   Cervical lymphadenopathy    right - followed my ENT   Dystonia 1994   diagnosed in [place]   Non Hodgkin's lymphoma ([place]) 2007   diffuse- 6 cycles of chemo with R CHOP Rituxan - last dose 10/08   Parkinson disease ([place])    2015    Past Surgical History:  Procedure Laterality Date   CHOLECYSTECTOMY, LAPAROSCOPIC     EYE SURGERY     x2 as child   PORT-A-CATH REMOVAL     PORTACATH PLACEMENT     SPLENECTOMY     TONSILLECTOMY      There were no vitals filed for this visit.   Subjective Assessment - 08/29/21 1131     Subjective Pt arrives today with sub WNL volume. "Yesterday I had a particularly good day. I could feel it in each exchange."    Currently in Pain? No/denies                   ADULT SLP TREATMENT - 08/29/21 1133       General Information   Behavior/Cognition Alert;Cooperative;Pleasant mood      Treatment Provided   Treatment provided Cognitive-Linquistic      Cognitive-Linquistic Treatment   Treatment focused on Dysarthria    Skilled Treatment In 10 minutes simple to mod ocmplex conversation prior to starting formal ST tasks, pt spoke with upper 60s dB average, with slowly declining volume after 8 minutes. Loud /a/ used to calibrate pt's volume in conversation - average  mid 90s dB. SLP targeted conversational segments of 6-8 minutes. Again, pt never got to average low 70s in any of these segments and averaged upper 60s dB consistently.      Assessment / Recommendations / Plan   Plan Continue with current plan of care      Progression Toward Goals   Progression toward goals Progressing toward goals                SLP Short Term Goals - 08/29/21 1234       SLP SHORT TERM GOAL #1   Title Pt will achieve 90dB average on loud /a/ over 3 sessions    Baseline 08-08-21, 08-12-21    Status Achieved    Target Date 08/30/21      SLP SHORT TERM GOAL #2   Title Pt will mainitain average of 70dB during sentence level responses with rare min cues over two sessions    Baseline 08-19-21    Status Achieved    Target Date 08/30/21      SLP SHORT TERM GOAL #3   Title In 5 minutes simple conversation pt will achieve average speech volume of low  70s dB over two sessions    Baseline upper 60s dB (08-29-21)    Time 4    Period Weeks    Status Partially Met    Target Date 08/30/21      SLP SHORT TERM GOAL #4   Title Pt will use abdominal breathing 75% of the time in 5 minutes simple conversation in 2 sessions    Baseline 08-19-21    Status Achieved    Target Date 08/30/21              SLP Long Term Goals - 08/29/21 1235       SLP LONG TERM GOAL #1   Title Pt will maintain average of low 70s dB over 10 minute simple conversation with rare min A over three sessions    Time 9    Period Weeks    Status On-going    Target Date 10/04/21      SLP LONG TERM GOAL #2   Title Pt will be audible over 15 minute conversation in noisy environment/outside of therapy room with min rare A x 2 sessions    Time 9    Period Weeks    Status On-going    Target Date 10/04/21      SLP LONG TERM GOAL #3   Title Pt will report less frequent requests to repeat himself outside of [place] room over 2 visits.    Baseline 08-26-21    Time 9    Period Weeks    Status  On-going    Target Date 10/04/21      SLP LONG TERM GOAL #4   Title pt will use abdominal breathing 75% of the time in 10 minutes simple conversation in three sessions    Baseline 08-26-21    Time 9    Period Weeks    Status On-going    Target Date 10/04/21              Plan - 08/29/21 1234     Clinical Impression Statement Pt presents today with improving mod hypokinetic dysarthria due to parkinson's disease (PD), with mostly abdominal breathing. Pt reports WNL eating/drinking, denying overt s/s aspiration during meals, however pt may require objective swallow eval during this therapy course (MBSS, FEES). Pt cont to progress through a heirarchy to achieve louder conversational speech. He cont to practice as directed each day. SLP believes pt will cont to benefit from skilled ST targeting incr'd volume and greater usage of abdomoinal breathing in order to improve communicative effectiveness.    Speech Therapy Frequency 2x / week    Duration --   9 weeks   Treatment/Interventions SLP instruction and feedback;Compensatory strategies;Patient/family education;Functional tasks;Multimodal communcation approach;Internal/external aids;Environmental controls    Potential to [place] provided today - /a/ and everyday sentences    Consulted and Agree with Plan of Care Patient             Patient will benefit from skilled therapeutic intervention in order to improve the following deficits and impairments:   Dysarthria and anarthria    Problem List Patient Active Problem List   Diagnosis Date Noted   Healthcare maintenance 06/28/2020   Gastroesophageal reflux disease 03/07/2020   Localized swelling of right lower extremity 04/12/2019   DOE (dyspnea on exertion) 07/15/2018   10 year risk of MI or stroke 7.5% or greater 04/12/2018   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, [place], [place], [address] Phone: [phone]   Fax:  [phone]   Name: MESIAH MANZO MRN: [id number] Date of Birth: Apr 08, 1951

## 2021-08-29 NOTE — Therapy (Signed)
[facility] [address], [place], [place], [address] Phone: [phone]   Fax:  [phone]  Physical Therapy Treatment  Patient Details  Name: [name] [name] MRN: [id number] Date of Birth: 29-Jun-1951 Referring Provider (PT): Tat, Wells Guiles   Encounter Date: 08/29/2021   PT End of Session - 08/29/21 1154     Visit Number 7    Number of Visits 17    Date for PT Re-Evaluation 10/04/21    PT Start Time 1150    PT Stop Time 1232    PT Time Calculation (min) 42 min    Equipment Utilized During Treatment --    Activity Tolerance Patient tolerated treatment well    Behavior During Therapy WFL for tasks assessed/performed             Past Medical History:  Diagnosis Date   Cervical lymphadenopathy    right - followed my ENT   Dystonia 1994   diagnosed in [place]   Non Hodgkin's lymphoma ([place]) 2007   diffuse- 6 cycles of chemo with R CHOP Rituxan - last dose 10/08   Parkinson disease ([place])    2015    Past Surgical History:  Procedure Laterality Date   CHOLECYSTECTOMY, LAPAROSCOPIC     EYE SURGERY     x2 as child   PORT-A-CATH REMOVAL     PORTACATH PLACEMENT     SPLENECTOMY     TONSILLECTOMY      There were no vitals filed for this visit.   Subjective Assessment - 08/29/21 1151     Subjective Doing pretty well.    Patient Stated Goals Pt's goals are to not fall and to move better overall; better posture.                               [facility] Adult PT Treatment/Exercise - 08/29/21 0001       Ambulation/Gait   Ambulation/Gait Yes    Ambulation/Gait Assistance 7: Independent    Ambulation Distance (Feet) 800 Feet   outdoor surfaces   Assistive device None    Gait Pattern Step-through pattern;Decreased trunk rotation;Lateral trunk lean to right;Poor foot clearance - left;Poor foot clearance - right    Gait Comments Gait with conversation tasks, with pt maintaining good posture, arm swing,  step length.  Discussed initiating walking for exercise at home, inside home to begin with, x 5 minutes.                 Balance Exercises - 08/29/21 0001       Balance Exercises: Standing   SLS with Vectors Solid surface;Limitations    SLS with Vectors Limitations Alternating step taps, 6" x 10, 12" step x 10 reps    Stepping Strategy Anterior;Posterior;Lateral;Foam/compliant surface;10 reps;Limitations    Stepping Strategy Limitations Standing on Airex    Step Ups Forward;6 inch;UE support 2;Limitations    Step Ups Limitations Forward step up/up, down x10 reps, then single step up x 10 reps.  Light UE support and cues for full foot placement    Step Over Hurdles / Cones Forward, then side, alternating legs x 10 reps over low hurdles; side, alternating legs x 10 reps over low hurdles; With forward step overs, cues to avoid circumduction    Marching Foam/compliant surface;Upper extremity assist 2;Static;10 reps   2 sets   Other Standing Exercises Sidestep up and off Airex, R and L, 10 reps.  cues to slow pace for foot clearance                PT Education - 08/29/21 1243     Education Details Beginning walking for exercise at home, inside house x 5 minutes    Person(s) Educated Patient    Methods Explanation    Comprehension Verbalized understanding              PT Short Term Goals - 08/06/21 1410       PT SHORT TERM GOAL #1   Title Pt will be independent with HEP for improved posture, strength, balance, transfers, and gait.  TARGET 09/06/2021    Time 4    Period Weeks    Status New      PT SHORT TERM GOAL #2   Title Pt will verbalize tips to reduce freezing episodes for improved gait in home and community environment.    Time 4    Period Weeks    Status New      PT SHORT TERM GOAL #3   Title Pt will improve push and release test posteriorly, to <2 steps independently regaining balance, for improved balance reactions.    Baseline 4 steps    Time 4     Period Weeks    Status New               PT Long Term Goals - 08/06/21 1413       PT LONG TERM GOAL #1   Title Pt will be independent with progression of HEP and community fitness plans upon d/c from PT for continuing to maximize gains made in PT.  TARGET 10/04/21    Time 8    Period Weeks    Status New      PT LONG TERM GOAL #2   Title Pt will improve gait velocity to at least 4 ft/sec for improved gait efficiency and safety in the community.    Baseline 3.16 ft/sec    Time 8    Period Weeks    Status New      PT LONG TERM GOAL #3   Title Pt will improve MiniBESTest score to at least 25/28 to decrease fall risk.    Baseline 22/28    Time 8    Period Weeks    Status New      PT LONG TERM GOAL #4   Title Pt will improve TUG/TUG cognitive to <10% difference for improved dual taskign with gait.    Baseline 10.06, 12.07 sec cog    Time 8    Period Weeks    Status New                   Plan - 08/29/21 1243     Clinical Impression Statement Focus of skilled PT session today was on balance activities, incorporating single limb stance, compliant surface, foot clearance, with additional cognitive/conversation tasks.  With addition of these conversation tasks, pt does have several episodes of decreased foot clearance, hitting obstacle.  However, pt does a good job with gait on level, outdoor surface with novel conversation tasks maintaining foot clearance, step length, posture and arm swing.  Pt has question about additional ways to exercise at home, and PT initiates education on walking for exercise at home.    Personal Factors and Comorbidities Comorbidity 3+    Comorbidities MH:  cervical lymphadenopathy, dystonia, Non Hodgkin's lymphoma    Examination-Activity Limitations Locomotion Level;Transfers;Stand    Examination-Participation Restrictions Community  Activity;Other   Hobbies   Stability/Clinical Decision Making Evolving/Moderate complexity    Rehab Potential  Good    PT Frequency 2x / week    PT Duration 8 weeks   plus eval   PT Treatment/Interventions ADLs/Self Care Home Management;Gait training;Functional mobility training;Therapeutic activities;Therapeutic exercise;Balance training;Neuromuscular re-education;Manual techniques;Patient/family education;Passive range of motion    PT Next Visit Plan Walking program for home, recumbent bike-discussion of aerobic and walking as part of comprehensive walking program.  Continue single limb, compliant surface balance work with foot clearance, dual tasking activities    Consulted and Agree with Plan of Care Patient             Patient will benefit from skilled therapeutic intervention in order to improve the following deficits and impairments:  Abnormal gait, Difficulty walking, Decreased balance, Postural dysfunction, Decreased strength, Decreased mobility  Visit Diagnosis: Unsteadiness on feet  Abnormal posture  Other abnormalities of gait and mobility  Other symptoms and signs involving the nervous system     Problem List Patient Active Problem List   Diagnosis Date Noted   Healthcare maintenance 06/28/2020   Gastroesophageal reflux disease 03/07/2020   Localized swelling of right lower extremity 04/12/2019   DOE (dyspnea on exertion) 07/15/2018   10 year risk of MI or stroke 7.5% or greater 04/12/2018   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, [place], [place], [address] Phone: [phone]   Fax:  [phone]  Name: [name] [name] MRN:  [id number] Date of Birth: December 17, 1950

## 2021-09-02 ENCOUNTER — Other Ambulatory Visit: Payer: Self-pay

## 2021-09-02 ENCOUNTER — Ambulatory Visit: Payer: Medicare Other | Admitting: Physical Therapy

## 2021-09-02 ENCOUNTER — Ambulatory Visit: Payer: Medicare Other

## 2021-09-02 ENCOUNTER — Encounter: Payer: Self-pay | Admitting: Physical Therapy

## 2021-09-02 DIAGNOSIS — R2689 Other abnormalities of gait and mobility: Secondary | ICD-10-CM | POA: Diagnosis not present

## 2021-09-02 DIAGNOSIS — R293 Abnormal posture: Secondary | ICD-10-CM

## 2021-09-02 DIAGNOSIS — R29818 Other symptoms and signs involving the nervous system: Secondary | ICD-10-CM | POA: Diagnosis not present

## 2021-09-02 DIAGNOSIS — R471 Dysarthria and anarthria: Secondary | ICD-10-CM | POA: Diagnosis not present

## 2021-09-02 DIAGNOSIS — R2681 Unsteadiness on feet: Secondary | ICD-10-CM

## 2021-09-02 NOTE — Therapy (Signed)
[facility] [address], [place], [place], [address] Phone: [phone]   Fax:  [phone]  Speech Language Pathology Treatment  Patient Details  Name: [name] [name] MRN: [id number] Date of Birth: 1951/08/17 Referring Provider (SLP): Alonza Bogus, DO   Encounter Date: 09/02/2021   End of Session - 09/02/21 1554     Visit Number 8    Number of Visits 17    Date for SLP Re-Evaluation 10/14/21   extended one week due to holiday   SLP Start Time 1103    SLP Stop Time  1145    SLP Time Calculation (min) 42 min    Activity Tolerance Patient tolerated treatment well             Past Medical History:  Diagnosis Date   Cervical lymphadenopathy    right - followed my ENT   Dystonia 1994   diagnosed in [place]   Non Hodgkin's lymphoma ([place]) 2007   diffuse- 6 cycles of chemo with R CHOP Rituxan - last dose 10/08   Parkinson disease ([place])    2015    Past Surgical History:  Procedure Laterality Date   CHOLECYSTECTOMY, LAPAROSCOPIC     EYE SURGERY     x2 as child   PORT-A-CATH REMOVAL     PORTACATH PLACEMENT     SPLENECTOMY     TONSILLECTOMY      There were no vitals filed for this visit.   Subjective Assessment - 09/02/21 1113     Subjective "That information you gave me (on Healthcare advance directives) helped my wife."    Currently in Pain? No/denies                   ADULT SLP TREATMENT - 09/02/21 1118       General Information   Behavior/Cognition Alert;Cooperative;Pleasant mood      Treatment Provided   Treatment provided Cognitive-Linquistic      Cognitive-Linquistic Treatment   Treatment focused on Dysarthria    Skilled Treatment Pt having a "good, not great" day. Simple to mod complex conversation prior to starting formal ST tasks was in mid 60s dB average, with slowly declining volume after 7 minutes. Loud /a/ used to calibrate pt's volume in conversation - average low to mid 90s dB.  SLP targeted conversational segments of 4-5 minutes. Again, pt never got to average low 70s in any of these segments and averaged mid-upper 60s dB consistently. Slightly less successful in conversation today than last week.      Assessment / Recommendations / Plan   Plan Continue with current plan of care      Progression Toward Goals   Progression toward goals Progressing toward goals                SLP Short Term Goals - 09/02/21 1555       SLP SHORT TERM GOAL #1   Title Pt will achieve 90dB average on loud /a/ over 3 sessions    Baseline 08-08-21, 08-12-21    Status Achieved    Target Date 08/30/21      SLP SHORT TERM GOAL #2   Title Pt will mainitain average of 70dB during sentence level responses with rare min cues over two sessions    Baseline 08-19-21    Status Achieved    Target Date 08/30/21      SLP SHORT TERM GOAL #3   Title In 5 minutes simple conversation pt will achieve  average speech volume of low 70s dB over two sessions    Baseline upper 60s dB (08-29-21)    Time 4    Period Weeks    Status Partially Met    Target Date 08/30/21      SLP SHORT TERM GOAL #4   Title Pt will use abdominal breathing 75% of the time in 5 minutes simple conversation in 2 sessions    Baseline 08-19-21    Status Achieved    Target Date 08/30/21              SLP Long Term Goals - 09/02/21 1555       SLP LONG TERM GOAL #1   Title Pt will maintain average of low 70s dB over 10 minute simple conversation with rare min A over three sessions    Time 9    Period Weeks    Status On-going    Target Date 10/04/21      SLP LONG TERM GOAL #2   Title Pt will be audible over 15 minute conversation in noisy environment/outside of therapy room with min rare A x 2 sessions    Time 9    Period Weeks    Status On-going    Target Date 10/04/21      SLP LONG TERM GOAL #3   Title Pt will report less frequent requests to repeat himself outside of [place] room over 2 visits.     Baseline 08-26-21    Time 9    Period Weeks    Status On-going    Target Date 10/04/21      SLP LONG TERM GOAL #4   Title pt will use abdominal breathing 75% of the time in 10 minutes simple conversation in three sessions    Baseline 08-26-21    Time 9    Period Weeks    Status On-going    Target Date 10/04/21              Plan - 09/02/21 1555     Clinical Impression Statement Pt presents today with improving mod hypokinetic dysarthria due to parkinson's disease (PD), with mostly abdominal breathing. Pt reports WNL eating/drinking, denying overt s/s aspiration during meals, however pt may require objective swallow eval during this therapy course (MBSS, FEES). Pt cont a heirarchy to achieve louder conversational speech. He cont to practice as directed each day. SLP believes pt will cont to benefit from skilled ST targeting incr'd volume and greater usage of abdomoinal breathing in order to improve communicative effectiveness.    Speech Therapy Frequency 2x / week    Duration --   9 weeks   Treatment/Interventions SLP instruction and feedback;Compensatory strategies;Patient/family education;Functional tasks;Multimodal communcation approach;Internal/external aids;Environmental controls    Potential to [place] provided today - /a/ and everyday sentences    Consulted and Agree with Plan of Care Patient             Patient will benefit from skilled therapeutic intervention in order to improve the following deficits and impairments:   Dysarthria and anarthria    Problem List Patient Active Problem List   Diagnosis Date Noted   Healthcare maintenance 06/28/2020   Gastroesophageal reflux disease 03/07/2020   Localized swelling of right lower extremity 04/12/2019   DOE (dyspnea on exertion) 07/15/2018   10 year risk of MI or stroke 7.5% or greater 04/12/2018   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, [place], [place], [address] Phone: [phone]   Fax:  [phone]   Name: DEMARCO BACCI MRN: [id number] Date of Birth: [birth year]

## 2021-09-02 NOTE — Patient Instructions (Signed)
Optimal Fitness Program after Therapy for People with Parkinson's Disease  1)  Therapy Home Exercise Program  -Do these Exercises DAILY as instructed by your therapist  -Big, deliberate effort with exercises  -These exercises are important to perform consistently, even when therapist has  finished, because these therapy exercises often address your specific  Parkinson's difficulties   2)  Walking  -  Work up to walking 3-5 times per week, 20-30 minutes per day  -This can be done at home, driveway, quiet street or an indoor track  -Focus should be on your Best posture, arm swing, step length for your best walking pattern  3)  Aerobic Exercise  -Work up to 3-5 times per week, 30 minutes per day  -This can be stationary bike, seated stepper machine, elliptical machine  -Work up to 7-8/10 intensity during the exercise, at minimal to moderate     Resistance

## 2021-09-02 NOTE — Therapy (Signed)
[facility] [address], [place], [place], [address] Phone: [phone]   Fax:  [phone]  Physical Therapy Treatment  Patient Details  Name: [name] [name] MRN: [id number] Date of Birth: 1951-01-18 Referring Provider (PT): Tat, Wells Guiles   Encounter Date: 09/02/2021   PT End of Session - 09/02/21 1026     Visit Number 8    Number of Visits 17    Date for PT Re-Evaluation 10/04/21    PT Start Time 1024    PT Stop Time 1102    PT Time Calculation (min) 38 min    Activity Tolerance Patient tolerated treatment well    Behavior During Therapy [facility] for tasks assessed/performed             Past Medical History:  Diagnosis Date   Cervical lymphadenopathy    right - followed my ENT   Dystonia 1994   diagnosed in [place]   Non Hodgkin's lymphoma ([place]) 2007   diffuse- 6 cycles of chemo with R CHOP Rituxan - last dose 10/08   Parkinson disease ([place])    2015    Past Surgical History:  Procedure Laterality Date   CHOLECYSTECTOMY, LAPAROSCOPIC     EYE SURGERY     x2 as child   PORT-A-CATH REMOVAL     PORTACATH PLACEMENT     SPLENECTOMY     TONSILLECTOMY      There were no vitals filed for this visit.   Subjective Assessment - 09/02/21 1026     Subjective The weekend was very busy.    Patient Stated Goals Pt's goals are to not fall and to move better overall; better posture.    Currently in Pain? No/denies                               [facility] Adult PT Treatment/Exercise - 09/02/21 0001       Knee/Hip Exercises: Aerobic   Nustep 4 extremities x 4 minutes, then 4 minutes lower extremities only x 4 minutes.  Level 4>5, for lower extremity strengthening and flexibility.  Cues to keep speed >80 steps/min.  Two bouts of 15 seconds, increased intensity.  Rates intensity level as 7-8/10    Other Aerobic Attempted use of recumbent bike 1 minute x 2 reps; however, resistance not kicking in on [employer]  UBE/recumbent bike, so discontinued and used seated stepper instead.            With NuStep, pt provides verbal and tactile cues to reset posture to avoid R lateral lean.      Balance Exercises - 09/02/21 0001       Balance Exercises: Standing   Stepping Strategy Posterior;10 reps    Stepping Strategy Limitations Progressed to variable perturbations in posteiror direction at hips and shoulder, cues for hinge at hips for improved balance recovery.  Pt has several episodes of 2 steps posteirorly, mostly able to recover in 1 step.  Posterior push and release:  2 trials:  2 steps posteriorly, then return to midline; 1 step posteriorly, then return to midline.             With education, discussed intensity, progression times, and interval use for aerobic activity.   PT Education - 09/02/21 1055     Education Details PD optimal exercise program including walking and aerobic activity progression    Person(s) Educated Patient    Methods Explanation;Demonstration;Handout  Comprehension Verbalized understanding;Returned demonstration              PT Short Term Goals - 08/06/21 1410       PT SHORT TERM GOAL #1   Title Pt will be independent with HEP for improved posture, strength, balance, transfers, and gait.  TARGET 09/06/2021    Time 4    Period Weeks    Status New      PT SHORT TERM GOAL #2   Title Pt will verbalize tips to reduce freezing episodes for improved gait in home and community environment.    Time 4    Period Weeks    Status New      PT SHORT TERM GOAL #3   Title Pt will improve push and release test posteriorly, to <2 steps independently regaining balance, for improved balance reactions.    Baseline 4 steps    Time 4    Period Weeks    Status New               PT Long Term Goals - 08/06/21 1413       PT LONG TERM GOAL #1   Title Pt will be independent with progression of HEP and community fitness plans upon d/c from PT for continuing to  maximize gains made in PT.  TARGET 10/04/21    Time 8    Period Weeks    Status New      PT LONG TERM GOAL #2   Title Pt will improve gait velocity to at least 4 ft/sec for improved gait efficiency and safety in the community.    Baseline 3.16 ft/sec    Time 8    Period Weeks    Status New      PT LONG TERM GOAL #3   Title Pt will improve MiniBESTest score to at least 25/28 to decrease fall risk.    Baseline 22/28    Time 8    Period Weeks    Status New      PT LONG TERM GOAL #4   Title Pt will improve TUG/TUG cognitive to <10% difference for improved dual taskign with gait.    Baseline 10.06, 12.07 sec cog    Time 8    Period Weeks    Status New                   Plan - 09/02/21 1155     Clinical Impression Statement Focus of today's skilled PT today on aerobic activity and education on optimal PD exercise program at home (today's education mainly on aerobic activity with use of Nustep, as pt has recumbent bike at home, and education on progresion of walking program).  Pt verbalizes good understanding of initiating comprehensive exercise program at home with walking and aerobic activity, as well as progression of program.  He will continue to benefit from skilled PT to further address balance, gait, postural strengthening for improved overall mobility.    Personal Factors and Comorbidities Comorbidity 3+    Comorbidities MH:  cervical lymphadenopathy, dystonia, Non Hodgkin's lymphoma    Examination-Activity Limitations Locomotion Level;Transfers;Stand    Examination-Participation Restrictions Community Activity;Other   Hobbies   Stability/Clinical Decision Making Evolving/Moderate complexity    Rehab Potential Good    PT Frequency 2x / week    PT Duration 8 weeks   plus eval   PT Treatment/Interventions ADLs/Self Care Home Management;Gait training;Functional mobility training;Therapeutic activities;Therapeutic exercise;Balance training;Neuromuscular re-education;Manual  techniques;Patient/family education;Passive range of motion  PT Next Visit Plan Review discussion of aerobic and walking as part of comprehensive walking program.  Check STGs and progress towards LTGS.  Continue single limb, compliant surface balance work with foot clearance, dual tasking activities    Consulted and Agree with Plan of Care Patient             Patient will benefit from skilled therapeutic intervention in order to improve the following deficits and impairments:  Abnormal gait, Difficulty walking, Decreased balance, Postural dysfunction, Decreased strength, Decreased mobility  Visit Diagnosis: Unsteadiness on feet  Abnormal posture     Problem List Patient Active Problem List   Diagnosis Date Noted   Healthcare maintenance 06/28/2020   Gastroesophageal reflux disease 03/07/2020   Localized swelling of right lower extremity 04/12/2019   DOE (dyspnea on exertion) 07/15/2018   10 year risk of MI or stroke 7.5% or greater 04/12/2018   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 [place], [place], [address] Phone: [phone]   Fax:  [phone]  Name: [name] [name] MRN: [id number] Date of Birth: [birth year]

## 2021-09-03 ENCOUNTER — Ambulatory Visit: Payer: Medicare Other

## 2021-09-03 ENCOUNTER — Ambulatory Visit: Payer: Medicare Other | Admitting: Physical Therapy

## 2021-09-03 DIAGNOSIS — R29818 Other symptoms and signs involving the nervous system: Secondary | ICD-10-CM | POA: Diagnosis not present

## 2021-09-03 DIAGNOSIS — R2681 Unsteadiness on feet: Secondary | ICD-10-CM

## 2021-09-03 DIAGNOSIS — R471 Dysarthria and anarthria: Secondary | ICD-10-CM

## 2021-09-03 DIAGNOSIS — R293 Abnormal posture: Secondary | ICD-10-CM | POA: Diagnosis not present

## 2021-09-03 DIAGNOSIS — R2689 Other abnormalities of gait and mobility: Secondary | ICD-10-CM | POA: Diagnosis not present

## 2021-09-03 NOTE — Therapy (Signed)
[facility] [address] [place], [place], [address] Phone: [phone]   Fax:  [phone]  Speech Language Pathology Treatment  Patient Details  Name: [name] [name] MRN: [id number] Date of Birth: 1951/06/22 Referring Provider (SLP): Alonza Bogus, DO   Encounter Date: 09/03/2021   End of Session - 09/03/21 1701     Visit Number 9    Number of Visits 17    Date for SLP Re-Evaluation 10/14/21   extended one week due to holiday   SLP Start Time 1403    SLP Stop Time  1445    SLP Time Calculation (min) 42 min    Activity Tolerance Patient tolerated treatment well             Past Medical History:  Diagnosis Date   Cervical lymphadenopathy    right - followed my ENT   Dystonia 1994   diagnosed in [place]   Non Hodgkin's lymphoma ([place]) 2007   diffuse- 6 cycles of chemo with R CHOP Rituxan - last dose 10/08   Parkinson disease ([place])    2015    Past Surgical History:  Procedure Laterality Date   CHOLECYSTECTOMY, LAPAROSCOPIC     EYE SURGERY     x2 as child   PORT-A-CATH REMOVAL     PORTACATH PLACEMENT     SPLENECTOMY     TONSILLECTOMY      There were no vitals filed for this visit.   Subjective Assessment - 09/03/21 1406     Subjective "I think I'm going to take a picture of that umbrella before we are done." (uses umbrella as a target distance to picture listener, to encourage louder speech production)    Currently in Pain? No/denies                   ADULT SLP TREATMENT - 09/03/21 1409       General Information   Behavior/Cognition Alert;Cooperative;Pleasant mood      Treatment Provided   Treatment provided Cognitive-Linquistic      Cognitive-Linquistic Treatment   Treatment focused on Dysarthria    Skilled Treatment Loud /a/ used to calibrate pt's volume in conversation - average low to mid 90s dB. He recited sentences (everyday) with low to mid 80s dB average, with initial cue. SLP  targeted conversational segments of 5-6 minutes x4. Pt  averaged low 70s and upper 60s dB consistently. He ID'd use of his diaphragm muscle as key in helping him reach WNL volume. SLP told pt that a FEEL of WNL speech volume is likely better than a visual cue (the umbrella outside of the [place] office) to last past d/c. Pt agreed.      Assessment / Recommendations / Plan   Plan Continue with current plan of care      Progression Toward Goals   Progression toward goals Progressing toward goals              SLP Education - 09/03/21 1700     Education Details Feel of WNL volume might be more salient and long-lasting than a visual cue for loudness    Person(s) Educated Patient    Methods Explanation    Comprehension Verbalized understanding              SLP Short Term Goals - 09/02/21 1555       SLP SHORT TERM GOAL #1   Title Pt will achieve 90dB average on loud /a/ over 3 sessions  Baseline 08-08-21, 08-12-21    Status Achieved    Target Date 08/30/21      SLP SHORT TERM GOAL #2   Title Pt will mainitain average of 70dB during sentence level responses with rare min cues over two sessions    Baseline 08-19-21    Status Achieved    Target Date 08/30/21      SLP SHORT TERM GOAL #3   Title In 5 minutes simple conversation pt will achieve average speech volume of low 70s dB over two sessions    Baseline upper 60s dB (08-29-21)    Time 4    Period Weeks    Status Partially Met    Target Date 08/30/21      SLP SHORT TERM GOAL #4   Title Pt will use abdominal breathing 75% of the time in 5 minutes simple conversation in 2 sessions    Baseline 08-19-21    Status Achieved    Target Date 08/30/21              SLP Long Term Goals - 09/03/21 1702       SLP LONG TERM GOAL #1   Title Pt will maintain average of low 70s dB over 10 minute simple conversation with rare min A over three sessions    Time 9    Period Weeks    Status On-going    Target Date 10/04/21       SLP LONG TERM GOAL #2   Title Pt will be audible over 15 minute conversation in noisy environment/outside of therapy room with min rare A x 2 sessions    Time 9    Period Weeks    Status On-going    Target Date 10/04/21      SLP LONG TERM GOAL #3   Title Pt will report less frequent requests to repeat himself outside of [place] room over 2 visits.    Baseline 08-26-21    Time 9    Period Weeks    Status Achieved    Target Date 10/04/21      SLP LONG TERM GOAL #4   Title pt will use abdominal breathing 75% of the time in 10 minutes simple conversation in three sessions    Baseline 08-26-21, 09-03-21    Time 9    Period Weeks    Status On-going    Target Date 10/04/21              Plan - 09/03/21 1701     Clinical Impression Statement Pt continues to present today with improving mod hypokinetic dysarthria due to parkinson's disease (PD), with abdominal breathing. Pt cont to reports WNL eating/drinking, denying overt s/s aspiration during meals. When pt has drunk water during session, no overt s/sx oral or pharyngeal difficulty ahs been noted. Pt cont a heirarchy to achieve louder conversational speech. He cont to practice as directed each day. SLP believes pt will cont to benefit from skilled ST targeting incr'd volume and greater usage of abdomoinal breathing in order to improve communicative effectiveness.    Speech Therapy Frequency 2x / week    Duration --   9 weeks   Treatment/Interventions SLP instruction and feedback;Compensatory strategies;Patient/family education;Functional tasks;Multimodal communcation approach;Internal/external aids;Environmental controls    Potential to [place] provided today - /a/ and everyday sentences    Consulted and Agree with Plan of Care Patient  Patient will benefit from skilled therapeutic intervention in order to improve the following deficits and impairments:   Dysarthria and  anarthria    Problem List Patient Active Problem List   Diagnosis Date Noted   Healthcare maintenance 06/28/2020   Gastroesophageal reflux disease 03/07/2020   Localized swelling of right lower extremity 04/12/2019   DOE (dyspnea on exertion) 07/15/2018   10 year risk of MI or stroke 7.5% or greater 04/12/2018   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 [place], [place], [address] Phone: [phone]   Fax:  [phone]   Name: [name] [name] MRN: [id number] Date of Birth: [birth year]

## 2021-09-03 NOTE — Therapy (Signed)
[facility] [address], [place], [place], [address] Phone: [phone]   Fax:  [phone]  Physical Therapy Treatment  Patient Details  Name: [name] [name] MRN: [id number] Date of Birth: 01/16/1951 Referring Provider (PT): Tat, Wells Guiles   Encounter Date: 09/03/2021   PT End of Session - 09/03/21 1324     Visit Number 9    Number of Visits 17    Date for PT Re-Evaluation 10/04/21    PT Start Time 1323    PT Stop Time 1402    PT Time Calculation (min) 39 min    Activity Tolerance Patient tolerated treatment well    Behavior During Therapy [facility] for tasks assessed/performed             Past Medical History:  Diagnosis Date   Cervical lymphadenopathy    right - followed my ENT   Dystonia 1994   diagnosed in [place]   Non Hodgkin's lymphoma ([place]) 2007   diffuse- 6 cycles of chemo with R CHOP Rituxan - last dose 10/08   Parkinson disease ([place])    2015    Past Surgical History:  Procedure Laterality Date   CHOLECYSTECTOMY, LAPAROSCOPIC     EYE SURGERY     x2 as child   PORT-A-CATH REMOVAL     PORTACATH PLACEMENT     SPLENECTOMY     TONSILLECTOMY      There were no vitals filed for this visit.   Subjective Assessment - 09/03/21 1324     Subjective Having some trouble with our stair lift (for wife).  No other changes.  Want to make sure to plan to use my recumbent bike at home.  Reports about 80% improvement in reduction of freezing episodes.    Patient Stated Goals Pt's goals are to not fall and to move better overall; better posture.    Currently in Pain? No/denies                               [facility] Adult PT Treatment/Exercise - 09/03/21 0001       Ambulation/Gait   Ambulation/Gait Yes    Ambulation/Gait Assistance 7: Independent    Ambulation Distance (Feet) 270 Feet    Assistive device None    Gait Pattern Step-through pattern;Lateral trunk lean to right;Decreased trunk rotation     Ambulation Surface Level;Indoor    Gait Comments Initial resisted gait, then resistance removed, pt able to maintain upright posture and good arm swing.      Self-Care   Self-Care Other Self-Care Comments    Other Self-Care Comments  Pt discussed tips to reduce freezing episodes,, including rocking, marching in place, stepping back then forward to start.  Pt able to verbalize and return demo understanding of various methods and reports 80% improvement in freezing of gait at home.      Knee/Hip Exercises: Aerobic   Nustep BLEs x 8 minutes, Level 4 throughout, (steps/min at 80),with 3 bouts of 15-30 second intervals of increased intensity (>110-115 steps/min)                 Balance Exercises - 09/03/21 0001       Balance Exercises: Standing   Stepping Strategy Posterior;10 reps   each, solid surface, then on balance beam   Stepping Strategy Limitations Progressed to variable perturbations in posteiror direction at hips and shoulder, cues for hinge at hips for improved balance recovery.  Pt has several episodes of 2 steps posteirorly, mostly able to recover in 1 step.  Posterior push and release:  2 trials:  2 steps posteriorly, then return to midline; 1 step posteriorly, then return to midline.    Balance Beam Forward/back step and weigthshift x 10 reps no UE support            Reviewed the following, with pt verbalize/demo understanding:  To help decrease freezing episodes later in the evening: In sitting:   -March in place x 5-10 reps with emphasis on stomping. -Kick leg forward 5-10 reps, alternating legs, with emphasis on Big kick    In standing: -March in place x 5-10 reps with emphasis on stomping to lift feet  -BIG step to start walking   Four square step activity/Box step: Step forward, Right, Back, then Left, with BIG step for foot clearance each direction (clockwise); then reverse to step in counter clockwise direction.  Repeat 3 times each direction  Pt  performs trunk stretch in standing x 3 reps, 30 sec, then standing PWR! Up with minimal initial posture cues.     PT Education - 09/03/21 1333     Education Details Progress towards goals, POC    Person(s) Educated Patient    Methods Explanation    Comprehension Verbalized understanding              PT Short Term Goals - 09/03/21 1334       PT SHORT TERM GOAL #1   Title Pt will be independent with HEP for improved posture, strength, balance, transfers, and gait.  TARGET 09/06/2021    Time 4    Period Weeks    Status Achieved      PT SHORT TERM GOAL #2   Title Pt will verbalize tips to reduce freezing episodes for improved gait in home and community environment.    Time 4    Period Weeks    Status Achieved      PT SHORT TERM GOAL #3   Title Pt will improve push and release test posteriorly, to <2 steps independently regaining balance, for improved balance reactions.    Baseline 4 steps    Time 4    Period Weeks    Status Achieved               PT Long Term Goals - 09/03/21 1457       PT LONG TERM GOAL #1   Title Pt will be independent with progression of HEP and community fitness plans upon d/c from PT for continuing to maximize gains made in PT.  TARGET 10/04/21    Time 8    Period Weeks    Status On-going      PT LONG TERM GOAL #2   Title Pt will improve gait velocity to at least 4 ft/sec for improved gait efficiency and safety in the community.    Baseline 3.16 ft/sec    Time 8    Period Weeks    Status On-going      PT LONG TERM GOAL #3   Title Pt will improve MiniBESTest score to at least 25/28 to decrease fall risk.    Baseline 22/28    Time 8    Period Weeks    Status On-going      PT LONG TERM GOAL #4   Title Pt will improve TUG/TUG cognitive to <10% difference for improved dual taskign with gait.    Baseline 10.06, 12.07 sec cog  Time 8    Period Weeks    Status On-going                   Plan - 09/03/21 1421      Clinical Impression Statement Assessed STGs this visit, with pt meeting 3 of 3 STGs.  Pt reports and demonstrates improved posture, balance and reduction in freezing episodes in his home environment.  He is utilizing strategies and tips to reduce freezing episodes, and he is interested in comprehensive exercise program for managing PD symptoms.  He will continue to benefit from skilled PT to further address balance and gait activities, as pt demonstrates fall risk per MiniBEStest and difficulty with dual tasking on TUG and TUG cognitive measures from eval.    Personal Factors and Comorbidities Comorbidity 3+    Comorbidities MH:  cervical lymphadenopathy, dystonia, Non Hodgkin's lymphoma    Examination-Activity Limitations Locomotion Level;Transfers;Stand    Examination-Participation Restrictions Community Activity;Other   Hobbies   Stability/Clinical Decision Making Evolving/Moderate complexity    Rehab Potential Good    PT Frequency 2x / week    PT Duration 8 weeks   plus eval   PT Treatment/Interventions ADLs/Self Care Home Management;Gait training;Functional mobility training;Therapeutic activities;Therapeutic exercise;Balance training;Neuromuscular re-education;Manual techniques;Patient/family education;Passive range of motion    PT Next Visit Plan Progress towards LTGS.  Continue single limb, compliant surface balance work with foot clearance, dual tasking activities, activities that were difficult on MiniBESTest    Consulted and Agree with Plan of Care Patient             Patient will benefit from skilled therapeutic intervention in order to improve the following deficits and impairments:  Abnormal gait, Difficulty walking, Decreased balance, Postural dysfunction, Decreased strength, Decreased mobility  Visit Diagnosis: Unsteadiness on feet  Abnormal posture  Other abnormalities of gait and mobility     Problem List Patient Active Problem List   Diagnosis Date Noted    Healthcare maintenance 06/28/2020   Gastroesophageal reflux disease 03/07/2020   Localized swelling of right lower extremity 04/12/2019   DOE (dyspnea on exertion) 07/15/2018   10 year risk of MI or stroke 7.5% or greater 04/12/2018   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, [place], [place], [address] Phone: [phone]   Fax:  [phone]  Name: JAKOB KIMBERLIN MRN: [id number] Date of Birth: July 03, 1951

## 2021-09-09 ENCOUNTER — Other Ambulatory Visit: Payer: Self-pay

## 2021-09-09 ENCOUNTER — Ambulatory Visit: Payer: Medicare Other

## 2021-09-09 ENCOUNTER — Encounter: Payer: Self-pay | Admitting: Physical Therapy

## 2021-09-09 ENCOUNTER — Ambulatory Visit: Payer: Medicare Other | Admitting: Physical Therapy

## 2021-09-09 DIAGNOSIS — R2681 Unsteadiness on feet: Secondary | ICD-10-CM

## 2021-09-09 DIAGNOSIS — R471 Dysarthria and anarthria: Secondary | ICD-10-CM

## 2021-09-09 DIAGNOSIS — R293 Abnormal posture: Secondary | ICD-10-CM | POA: Diagnosis not present

## 2021-09-09 DIAGNOSIS — R2689 Other abnormalities of gait and mobility: Secondary | ICD-10-CM

## 2021-09-09 DIAGNOSIS — R29818 Other symptoms and signs involving the nervous system: Secondary | ICD-10-CM | POA: Diagnosis not present

## 2021-09-09 NOTE — Therapy (Signed)
[facility] [address], [place], [place], [address] Phone: [phone]   Fax:  [phone]  Speech Language Pathology Treatment/Progress note  Patient Details  Name: [name] [name] MRN: [id number] Date of Birth: 1951-01-21 Referring Provider (SLP): Alonza Bogus, DO   Encounter Date: 09/09/2021   End of Session - 09/09/21 1806     Visit Number 10    Number of Visits 17    Date for SLP Re-Evaluation 10/14/21   extended one week due to holiday   SLP Start Time 1150    SLP Stop Time  1230    SLP Time Calculation (min) 40 min    Activity Tolerance Patient tolerated treatment well             Past Medical History:  Diagnosis Date   Cervical lymphadenopathy    right - followed my ENT   Dystonia 1994   diagnosed in [place]   Non Hodgkin's lymphoma ([place]) 2007   diffuse- 6 cycles of chemo with R CHOP Rituxan - last dose 10/08   Parkinson disease ([place])    2015    Past Surgical History:  Procedure Laterality Date   CHOLECYSTECTOMY, LAPAROSCOPIC     EYE SURGERY     x2 as child   PORT-A-CATH REMOVAL     PORTACATH PLACEMENT     SPLENECTOMY     TONSILLECTOMY      There were no vitals filed for this visit.  Speech Therapy Progress Note  Dates of Reporting Period: 08-06-21 to present  Subjective Statement: Pt has been seen for 10 ST visits focusing on WNL speech loudness.  Objective: See below.  Goal Update: SEe below.  Plan: See below  Reason Skilled Services are Required: Pt has not reached max potential.      Subjective Assessment - 09/09/21 1151     Subjective Pt reported a "good weekend" with speech - nobody asked him to repeat in Sunday School.    Currently in Pain? Yes    Pain Score 5     Pain Location Back    Pain Orientation Right    Pain Descriptors / Indicators Aching;Sore    Pain Type Acute pain    Pain Onset In the past 7 days    Pain Frequency Intermittent                    ADULT SLP TREATMENT - 09/09/21 1153       General Information   Behavior/Cognition Alert;Cooperative;Pleasant mood      Treatment Provided   Treatment provided Cognitive-Linquistic      Cognitive-Linquistic Treatment   Treatment focused on Dysarthria    Skilled Treatment 8 minute conversation entering ST room averaged mid-60s dB. When SLP cued pt he was getting softer he said "I had just noticed that." Loud /a/ again used to calibrate pt's volume in conversation - average low to mid 90s dB. He recited sentences (everyday) with low to mid 80s dB average, with initial cue for loudness. SLP targeted conversational segments of 6-8 minutes x4. Pt  averaged upper 60s dB consistently; after loud /a/ recalibration after 2 conversational segments average improved for first minute of the next segment but then decr'd back to mid-upper 60s. SLP stressed importance of practice between sessions so that stronger WNL speech is habitualized and thus conversation with incr'd cognitive demand is not softer.      Assessment / Recommendations / Plan   Plan Continue with current  plan of care      Progression Toward Goals   Progression toward goals Progressing toward goals              SLP Education - 09/09/21 1806     Education Details homework important!    Person(s) Educated Patient    Methods Explanation    Comprehension Verbalized understanding              SLP Short Term Goals - 09/02/21 1555       SLP SHORT TERM GOAL #1   Title Pt will achieve 90dB average on loud /a/ over 3 sessions    Baseline 08-08-21, 08-12-21    Status Achieved    Target Date 08/30/21      SLP SHORT TERM GOAL #2   Title Pt will mainitain average of 70dB during sentence level responses with rare min cues over two sessions    Baseline 08-19-21    Status Achieved    Target Date 08/30/21      SLP SHORT TERM GOAL #3   Title In 5 minutes simple conversation pt will achieve average speech volume of low 70s dB over  two sessions    Baseline upper 60s dB (08-29-21)    Time 4    Period Weeks    Status Partially Met    Target Date 08/30/21      SLP SHORT TERM GOAL #4   Title Pt will use abdominal breathing 75% of the time in 5 minutes simple conversation in 2 sessions    Baseline 08-19-21    Status Achieved    Target Date 08/30/21              SLP Long Term Goals - 09/09/21 1808       SLP LONG TERM GOAL #1   Title Pt will maintain average of low 70s dB over 10 minute simple conversation with rare min A over three sessions    Time 9    Period Weeks    Status On-going    Target Date 10/04/21      SLP LONG TERM GOAL #2   Title Pt will be audible over 15 minute conversation in noisy environment/outside of therapy room with min rare A x 2 sessions    Time 9    Period Weeks    Status On-going    Target Date 10/04/21      SLP LONG TERM GOAL #3   Title Pt will report less frequent requests to repeat himself outside of [place] room over 2 visits.    Baseline 08-26-21    Status Achieved    Target Date 10/04/21      SLP LONG TERM GOAL #4   Title pt will use abdominal breathing 75% of the time in 10 minutes simple conversation in three sessions    Baseline 08-26-21, 09-03-21    Status Achieved              Plan - 09/09/21 1807     Clinical Impression Statement Pt continues to present today with improving mod hypokinetic dysarthria due to parkinson's disease (PD), with abdominal breathing. Pt cont to reports WNL eating/drinking, denying overt s/s aspiration during meals. When pt has drunk water during session, no overt s/sx oral or pharyngeal difficulty ahs been noted. Pt cont a heirarchy to achieve louder conversational speech. He cont to practice loud /a/ and everyday sentences as well as some targeted episodes of louder WNL speech as directed each day. SLP believes pt  will cont to benefit from skilled ST targeting incr'd volume and greater usage of abdomoinal breathing in order to improve  communicative effectiveness.    Speech Therapy Frequency 2x / week    Duration --   9 weeks   Treatment/Interventions SLP instruction and feedback;Compensatory strategies;Patient/family education;Functional tasks;Multimodal communcation approach;Internal/external aids;Environmental controls    Potential to [name] provided today - /a/ and everyday sentences    Consulted and Agree with Plan of Care Patient             Patient will benefit from skilled therapeutic intervention in order to improve the following deficits and impairments:   Dysarthria and anarthria    Problem List Patient Active Problem List   Diagnosis Date Noted   Healthcare maintenance 06/28/2020   Gastroesophageal reflux disease 03/07/2020   Localized swelling of right lower extremity 04/12/2019   DOE (dyspnea on exertion) 07/15/2018   10 year risk of MI or stroke 7.5% or greater 04/12/2018   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 [place], [place], [address] Phone: [phone]   Fax:  [phone]   Name: YADIER BRAMHALL MRN: [id number] Date of Birth: 1951-08-25

## 2021-09-09 NOTE — Therapy (Signed)
[facility] [address] [place], [place], [address] Phone: [phone]   Fax:  [phone]  Physical Therapy Treatment/10th Visit Progress Note  Patient Details  Name: [name] [name] MRN: [id number] Date of Birth: [birth year] Referring Provider (PT): Tat, Wells Guiles  See clinical impression statement for progress note details.  Encounter Date: 09/09/2021   PT End of Session - 09/09/21 1123     Visit Number 10    Number of Visits 17    Date for PT Re-Evaluation 10/04/21    PT Start Time 1116   Pt arrives late   PT Stop Time 1146    PT Time Calculation (min) 30 min    Activity Tolerance Patient tolerated treatment well    Behavior During Therapy WFL for tasks assessed/performed             Past Medical History:  Diagnosis Date   Cervical lymphadenopathy    right - followed my ENT   Dystonia 1994   diagnosed in [place]   Non Hodgkin's lymphoma ([place]) 2007   diffuse- 6 cycles of chemo with R CHOP Rituxan - last dose 10/08   Parkinson disease ([place])    2015    Past Surgical History:  Procedure Laterality Date   CHOLECYSTECTOMY, LAPAROSCOPIC     EYE SURGERY     x2 as child   PORT-A-CATH REMOVAL     PORTACATH PLACEMENT     SPLENECTOMY     TONSILLECTOMY      There were no vitals filed for this visit.   Subjective Assessment - 09/09/21 1117     Subjective Haven't been able to use my bike at home due to it's in the room where my wife is wrapping presents.  Did a lot of walking on Saturday and Sunday and worked on a project at home.  Had some soreness in my back.    Patient Stated Goals Pt's goals are to not fall and to move better overall; better posture.    Currently in Pain? Yes    Pain Score 5     Pain Location Back    Pain Orientation Right    Pain Descriptors / Indicators Aching;Sore    Pain Type Acute pain    Pain Onset In the past 7 days    Pain Frequency Intermittent    Aggravating Factors  certain  positions    Pain Relieving Factors stretches help                               [facility] Adult PT Treatment/Exercise - 09/09/21 0001       Standardized Balance Assessment   Standardized Balance Assessment Timed Up and Go Test      Timed Up and Go Test   TUG Normal TUG;Cognitive TUG    Normal TUG (seconds) 9.63    Cognitive TUG (seconds) 9.84      High Level Balance   High Level Balance Activities Figure 8 turns;Marching turns;Weight-shifting turns    High Level Balance Comments Practiced wide U turns around cones, then more narrow spaces, marching, weightshifting, and step/shift turns.  Progressed to tight, marching turns carrying cones BUEs, occasional cues to increased step height for improved foot clearance with turns.      Knee/Hip Exercises: Aerobic   Nustep BLEs x 6 minutes, Level 4 throughout, (steps/min at 80),with 1 bout of 30 second interval of increased intensity (>110-115, up to  148 steps/min)                 Balance Exercises - 09/09/21 0001       Balance Exercises: Standing   SLS Eyes open;Intermittent upper extremity support;Upper extremity support 1;10 secs;Limitations;5 reps    SLS Limitations Progressed to no UE support, RLE 6 seconds, 10 seconds; then LLE 6 seconds, 3 sec    Heel Raises Both;10 reps;Limitations   2 sets   Heel Raises Limitations 3 sec hold    Toe Raise Both;10 reps;Limitations   2 sets   Toe Raise Limitations 3 sec hold                  PT Short Term Goals - 09/03/21 1334       PT SHORT TERM GOAL #1   Title Pt will be independent with HEP for improved posture, strength, balance, transfers, and gait.  TARGET 09/06/2021    Time 4    Period Weeks    Status Achieved      PT SHORT TERM GOAL #2   Title Pt will verbalize tips to reduce freezing episodes for improved gait in home and community environment.    Time 4    Period Weeks    Status Achieved      PT SHORT TERM GOAL #3   Title Pt will improve  push and release test posteriorly, to <2 steps independently regaining balance, for improved balance reactions.    Baseline 4 steps    Time 4    Period Weeks    Status Achieved               PT Long Term Goals - 09/03/21 1457       PT LONG TERM GOAL #1   Title Pt will be independent with progression of HEP and community fitness plans upon d/c from PT for continuing to maximize gains made in PT.  TARGET 10/04/21    Time 8    Period Weeks    Status On-going      PT LONG TERM GOAL #2   Title Pt will improve gait velocity to at least 4 ft/sec for improved gait efficiency and safety in the community.    Baseline 3.16 ft/sec    Time 8    Period Weeks    Status On-going      PT LONG TERM GOAL #3   Title Pt will improve MiniBESTest score to at least 25/28 to decrease fall risk.    Baseline 22/28    Time 8    Period Weeks    Status On-going      PT LONG TERM GOAL #4   Title Pt will improve TUG/TUG cognitive to <10% difference for improved dual taskign with gait.    Baseline 10.06, 12.07 sec cog    Time 8    Period Weeks    Status On-going                   Plan - 09/09/21 1153     Clinical Impression Statement 10th Visit Progress Note, covering dates from 08/06/2021 to 09/09/2021.  Pt subjectively reports that he is experiencing less freezing episodes at home, able to use strategies discussed and practiced in therapy sessions to reduce freezing episodes.  Objectively, pt is able to demo posterior push and release test (last visit) with 1-2 steps to recover balance.  TUG score/TUG cognitive score today as improved to <10% difference, indicating increased ability for dual tasking.  At last visit with STG check, pt has met 3 of 3 STGs.  He is on target towards LTGs and would benefit from continued skilled PT towards LTGs for improved overall functional mobility and decreased fall risk.    Personal Factors and Comorbidities Comorbidity 3+    Comorbidities MH:  cervical  lymphadenopathy, dystonia, Non Hodgkin's lymphoma    Examination-Activity Limitations Locomotion Level;Transfers;Stand    Examination-Participation Restrictions Community Activity;Other   Hobbies   Stability/Clinical Decision Making Evolving/Moderate complexity    Rehab Potential Good    PT Frequency 2x / week    PT Duration 8 weeks   plus eval   PT Treatment/Interventions ADLs/Self Care Home Management;Gait training;Functional mobility training;Therapeutic activities;Therapeutic exercise;Balance training;Neuromuscular re-education;Manual techniques;Patient/family education;Passive range of motion    PT Next Visit Plan Progress towards LTGS.  (Pt cancelled 12/21 visit and is due to come back week of 09/24/21.  Will need to discuss further scheduling x 1 more week or plan for d/c.)  Continue single limb, compliant surface balance work with foot clearance, dual tasking activities, activities that were difficult on MiniBESTest    Consulted and Agree with Plan of Care Patient             Patient will benefit from skilled therapeutic intervention in order to improve the following deficits and impairments:  Abnormal gait, Difficulty walking, Decreased balance, Postural dysfunction, Decreased strength, Decreased mobility  Visit Diagnosis: Unsteadiness on feet  Other abnormalities of gait and mobility  Abnormal posture     Problem List Patient Active Problem List   Diagnosis Date Noted   Healthcare maintenance 06/28/2020   Gastroesophageal reflux disease 03/07/2020   Localized swelling of right lower extremity 04/12/2019   DOE (dyspnea on exertion) 07/15/2018   10 year risk of MI or stroke 7.5% or greater 04/12/2018   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, [place], [place], [address] Phone: [phone]   Fax:  [phone]  Name: PERSHING SKIDMORE MRN: [id number] Date of Birth: 02/11/1951

## 2021-09-11 ENCOUNTER — Ambulatory Visit: Payer: Medicare Other

## 2021-09-11 ENCOUNTER — Ambulatory Visit: Payer: Medicare Other | Admitting: Physical Therapy

## 2021-09-24 ENCOUNTER — Ambulatory Visit: Payer: Medicare Other

## 2021-09-24 ENCOUNTER — Other Ambulatory Visit: Payer: Self-pay

## 2021-09-24 ENCOUNTER — Encounter: Payer: Self-pay | Admitting: Physical Therapy

## 2021-09-24 ENCOUNTER — Ambulatory Visit: Payer: Medicare Other | Attending: Neurology | Admitting: Physical Therapy

## 2021-09-24 DIAGNOSIS — R471 Dysarthria and anarthria: Secondary | ICD-10-CM

## 2021-09-24 DIAGNOSIS — R293 Abnormal posture: Secondary | ICD-10-CM | POA: Insufficient documentation

## 2021-09-24 DIAGNOSIS — R2681 Unsteadiness on feet: Secondary | ICD-10-CM | POA: Insufficient documentation

## 2021-09-24 DIAGNOSIS — R2689 Other abnormalities of gait and mobility: Secondary | ICD-10-CM | POA: Insufficient documentation

## 2021-09-24 NOTE — Therapy (Signed)
[facility] [address], [place], [place], [address] Phone: [phone]   Fax:  [phone]  Speech Language Pathology Treatment  Patient Details  Name: [name] [name] MRN: [id number] Date of Birth: Jan 19, 1951 Referring Provider (SLP): Alonza Bogus, DO   Encounter Date: 09/24/2021   End of Session - 09/24/21 1351     Visit Number 11    Number of Visits 17    Date for SLP Re-Evaluation 10/14/21   extended one week due to holiday   SLP Start Time 1104    SLP Stop Time  1145    SLP Time Calculation (min) 41 min    Activity Tolerance Patient tolerated treatment well             Past Medical History:  Diagnosis Date   Cervical lymphadenopathy    right - followed my ENT   Dystonia 1994   diagnosed in [place]   Non Hodgkin's lymphoma ([place]) 2007   diffuse- 6 cycles of chemo with R CHOP Rituxan - last dose 10/08   Parkinson disease ([place])    2015    Past Surgical History:  Procedure Laterality Date   CHOLECYSTECTOMY, LAPAROSCOPIC     EYE SURGERY     x2 as child   PORT-A-CATH REMOVAL     PORTACATH PLACEMENT     SPLENECTOMY     TONSILLECTOMY      There were no vitals filed for this visit.   Subjective Assessment - 09/24/21 1114     Subjective Pt tells SLP holidays were stressful due to wife's illness.    Currently in Pain? No/denies                   ADULT SLP TREATMENT - 09/24/21 1123       General Information   Behavior/Cognition Alert;Cooperative;Pleasant mood      Treatment Provided   Treatment provided Cognitive-Linquistic      Cognitive-Linquistic Treatment   Treatment focused on Dysarthria    Skilled Treatment Reports high-stress environement over holidays revolving mainly around wife's illness. "I was probably 50/50," pt stated re: thinking about louder speech in last 3-4 days. In 7 minute mod complex conversation pt averaged low-mid 60s dB. SLP had pt do loud /a/ to habitualize louder  conversational speech; average low 90s dB. Everyday sentences completed today with average mid 80s dB with SBA. SLP asked pt math questions and pt provided answer with WNL loudness 80% of the time. Conversation in between questions was low-mid 60s dB. SLP and pt agreed pt needs more skilled ST.      Assessment / Recommendations / Plan   Plan Continue with current plan of care      Progression Toward Goals   Progression toward goals Not progressing toward goals (comment)                SLP Short Term Goals - 09/02/21 1555       SLP SHORT TERM GOAL #1   Title Pt will achieve 90dB average on loud /a/ over 3 sessions    Baseline 08-08-21, 08-12-21    Status Achieved    Target Date 08/30/21      SLP SHORT TERM GOAL #2   Title Pt will mainitain average of 70dB during sentence level responses with rare min cues over two sessions    Baseline 08-19-21    Status Achieved    Target Date 08/30/21      SLP SHORT TERM  GOAL #3   Title In 5 minutes simple conversation pt will achieve average speech volume of low 70s dB over two sessions    Baseline upper 60s dB (08-29-21)    Time 4    Period Weeks    Status Partially Met    Target Date 08/30/21      SLP SHORT TERM GOAL #4   Title Pt will use abdominal breathing 75% of the time in 5 minutes simple conversation in 2 sessions    Baseline 08-19-21    Status Achieved    Target Date 08/30/21              SLP Long Term Goals - 09/24/21 1353       SLP LONG TERM GOAL #1   Title Pt will maintain average of low 70s dB over 10 minute simple conversation with rare min A over three sessions    Time 9    Period Weeks    Status On-going    Target Date 10/04/21      SLP LONG TERM GOAL #2   Title Pt will be audible over 15 minute conversation in noisy environment/outside of therapy room with min rare A x 2 sessions    Time 9    Period Weeks    Status On-going    Target Date 10/04/21      SLP LONG TERM GOAL #3   Title Pt will report  less frequent requests to repeat himself outside of [place] room over 2 visits.    Baseline 08-26-21    Status Achieved    Target Date 10/04/21      SLP LONG TERM GOAL #4   Title pt will use abdominal breathing 75% of the time in 10 minutes simple conversation in three sessions    Baseline 08-26-21, 09-03-21    Status Achieved              Plan - 09/24/21 1352     Clinical Impression Statement Pt continues to present today with mod hypokinetic dysarthria due to parkinson's disease (PD), with abdominal breathing. Pt cont to reports WNL eating/drinking, denying overt s/s aspiration during meals. When pt has drunk water during session, no overt s/sx oral or pharyngeal difficulty ahs been noted. Pt observed today by SLP to slip into low-mid 60s dB conversational volume - lower average compared to previous 3-4 sessions. He will need to cont a heirarchy to achieve louder conversational speech. SLP reiterated need to perform loud /a/ and everyday sentenes BID. SLP believes pt will cont to benefit from skilled ST targeting incr'd volume and greater usage of abdomoinal breathing in order to improve communicative effectiveness.    Speech Therapy Frequency 2x / week    Duration --   9 weeks   Treatment/Interventions SLP instruction and feedback;Compensatory strategies;Patient/family education;Functional tasks;Multimodal communcation approach;Internal/external aids;Environmental controls    Potential to [place] provided today - /a/ and everyday sentences    Consulted and Agree with Plan of Care Patient             Patient will benefit from skilled therapeutic intervention in order to improve the following deficits and impairments:   Dysarthria and anarthria    Problem List Patient Active Problem List   Diagnosis Date Noted   Healthcare maintenance 06/28/2020   Gastroesophageal reflux disease 03/07/2020   Localized swelling of right lower extremity  04/12/2019   DOE (dyspnea on exertion) 07/15/2018   10 year risk  of MI or stroke 7.5% or greater 04/12/2018   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 [place], [place], [address] Phone: [phone]   Fax:  [phone]   Name: MERLEN GURRY MRN: [id number] Date of Birth: 10/20/1950

## 2021-09-24 NOTE — Therapy (Signed)
[facility] [address], [place], [place], [address] Phone: [phone]   Fax:  [phone]  Physical Therapy Treatment  Patient Details  Name: [name] [name] MRN: [id number] Date of Birth: 1951-08-24 Referring Provider (PT): Tat, Wells Guiles   Encounter Date: 09/24/2021   PT End of Session - 09/24/21 1019     Visit Number 11    Number of Visits 17    Date for PT Re-Evaluation 10/04/21    PT Start Time 1020    PT Stop Time 1100    PT Time Calculation (min) 40 min    Activity Tolerance Patient tolerated treatment well    Behavior During Therapy [facility] for tasks assessed/performed             Past Medical History:  Diagnosis Date   Cervical lymphadenopathy    right - followed my ENT   Dystonia 1994   diagnosed in [place]   Non Hodgkin's lymphoma ([place]) 2007   diffuse- 6 cycles of chemo with R CHOP Rituxan - last dose 10/08   Parkinson disease ([place])    2015    Past Surgical History:  Procedure Laterality Date   CHOLECYSTECTOMY, LAPAROSCOPIC     EYE SURGERY     x2 as child   PORT-A-CATH REMOVAL     PORTACATH PLACEMENT     SPLENECTOMY     TONSILLECTOMY      There were no vitals filed for this visit.   Subjective Assessment - 09/24/21 1019     Subjective Had a busy holiday-my wife had pneumonia, so it was a lot.  I have had some stumbles, no falls.    Patient Stated Goals Pt's goals are to not fall and to move better overall; better posture.    Pain Onset --                               [facility] Adult PT Treatment/Exercise - 09/24/21 0001       Ambulation/Gait   Ambulation/Gait Yes    Ambulation/Gait Assistance 7: Independent    Ambulation Distance (Feet) 300 Feet    Assistive device None    Gait Pattern Step-through pattern;Lateral trunk lean to right;Decreased trunk rotation    Ambulation Surface Level;Indoor    Gait velocity 8.16 sec= 4.02 ft/sec      Standardized Balance Assessment    Standardized Balance Assessment Timed Up and Go Test;Mini-BESTest      Timed Up and Go Test   TUG Normal TUG;Cognitive TUG    Normal TUG (seconds) 9.5    Cognitive TUG (seconds) 10.1      Mini-BESTest   Sit To Stand Normal: Comes to stand without use of hands and stabilizes independently.    Rise to Toes Normal: Stable for 3 s with maximum height.    Stand on one leg (left) Moderate: < 20 s   5, 6.84   Stand on one leg (right) Moderate: < 20 s   2.25, 1.69   Stand on one leg - lowest score 1    Compensatory Stepping Correction - Forward Normal: Recovers independently with a single, large step (second realignement is allowed).    Compensatory Stepping Correction - Backward Normal: Recovers independently with a single, large step    Compensatory Stepping Correction - Left Lateral Normal: Recovers independently with 1 step (crossover or lateral OK)    Compensatory Stepping Correction - Right Lateral Normal: Recovers  independently with 1 step (crossover or lateral OK)    Stepping Corredtion Lateral - lowest score 2    Stance - Feet together, eyes open, firm surface  Normal: 30s    Stance - Feet together, eyes closed, foam surface  Normal: 30s    Incline - Eyes Closed Normal: Stands independently 30s and aligns with gravity    Change in Gait Speed Normal: Significantly changes walkling speed without imbalance    Walk with head turns - Horizontal Normal: performs head turns with no change in gait speed and good balance    Walk with pivot turns Normal: Turns with feet close FAST (< 3 steps) with good balance.    Step over obstacles Normal: Able to step over box with minimal change of gait speed and with good balance.    Timed UP & GO with Dual Task Normal: No noticeable change in sitting, standing or walking while backward counting when compared to TUG without    Mini-BEST total score 27      Knee/Hip Exercises: Aerobic   Nustep BLEs x 6 minutes, Level 5 throughout, (steps/min at 80),with 2  bouts of 30 second interval of increased intensity (>110-115 steps/min)                 Self-Care    PT Education - 09/24/21 1158     Education Details Provided exercise chart for tracking optimal exercise program (see instructions); also discussed community fitness option, including [facility] for use of aerobic machines, walking area and possibly AHOY classes; discussed "replacing" PT time with community fitness as described above at [employer] (near pt's home)    Person(s) Educated Patient    Methods Explanation;Handout    Comprehension Verbalized understanding              PT Short Term Goals - 09/03/21 1334       PT SHORT TERM GOAL #1   Title Pt will be independent with HEP for improved posture, strength, balance, transfers, and gait.  TARGET 09/06/2021    Time 4    Period Weeks    Status Achieved      PT SHORT TERM GOAL #2   Title Pt will verbalize tips to reduce freezing episodes for improved gait in home and community environment.    Time 4    Period Weeks    Status Achieved      PT SHORT TERM GOAL #3   Title Pt will improve push and release test posteriorly, to <2 steps independently regaining balance, for improved balance reactions.    Baseline 4 steps    Time 4    Period Weeks    Status Achieved               PT Long Term Goals - 09/24/21 1046       PT LONG TERM GOAL #1   Title Pt will be independent with progression of HEP and community fitness plans upon d/c from PT for continuing to maximize gains made in PT.  TARGET 10/04/21    Time 8    Period Weeks    Status On-going      PT LONG TERM GOAL #2   Title Pt will improve gait velocity to at least 4 ft/sec for improved gait efficiency and safety in the community.    Baseline 3.16 ft/sec; 4.02 ft/sec 09/24/2021    Time 8    Period Weeks    Status Achieved      PT LONG  TERM GOAL #3   Title Pt will improve MiniBESTest score to at least 25/28 to decrease fall risk.    Baseline  22/28; improved  to 27/28 09/24/2021    Time 8    Period Weeks    Status Achieved      PT LONG TERM GOAL #4   Title Pt will improve TUG/TUG cognitive to <10% difference for improved dual taskign with gait.    Baseline 10.06, 12.07 sec cog; goal met 09/24/2021    Time 8    Period Weeks    Status Achieved                   Plan - 09/24/21 1200     Clinical Impression Statement Began assessing LTGs this visit, as pt feels he is making progress and has what he needs to be successful going forward beyond this week.  Pt has met LTGs 2, 3,4, with improved TUG/TUG cognitive, gait velocity, and MiniBESTest scores.  Pt is no longer in fall risk range for MiniBESTest, and demonstrates improved ability for dual tasking.  Plan for full review of HEP and follow up about community fitness options next visit, plans for d/c next visit.    Personal Factors and Comorbidities Comorbidity 3+    Comorbidities MH:  cervical lymphadenopathy, dystonia, Non Hodgkin's lymphoma    Examination-Activity Limitations Locomotion Level;Transfers;Stand    Examination-Participation Restrictions Community Activity;Other   Hobbies   Stability/Clinical Decision Making Evolving/Moderate complexity    Rehab Potential Good    PT Frequency 2x / week    PT Duration 8 weeks   plus eval   PT Treatment/Interventions ADLs/Self Care Home Management;Gait training;Functional mobility training;Therapeutic activities;Therapeutic exercise;Balance training;Neuromuscular re-education;Manual techniques;Patient/family education;Passive range of motion    PT Next Visit Plan Check full HEP and follow up on community fitness [facility]); discuss return eval versus screen.  Plan for d/c next visit.    Consulted and Agree with Plan of Care Patient             Patient will benefit from skilled therapeutic intervention in order to improve the following deficits and impairments:  Abnormal gait, Difficulty walking, Decreased balance,  Postural dysfunction, Decreased strength, Decreased mobility  Visit Diagnosis: Unsteadiness on feet  Other abnormalities of gait and mobility  Abnormal posture     Problem List Patient Active Problem List   Diagnosis Date Noted   Healthcare maintenance 06/28/2020   Gastroesophageal reflux disease 03/07/2020   Localized swelling of right lower extremity 04/12/2019   DOE (dyspnea on exertion) 07/15/2018   10 year risk of MI or stroke 7.5% or greater 04/12/2018   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, [place], [place], [address] Phone: [phone]   Fax:  [phone]  Name: [name] [name] MRN: [id number] Date of Birth: 1950/12/28

## 2021-09-24 NOTE — Patient Instructions (Addendum)
(  Exercise) Monday Tuesday Wednesday Thursday Friday Saturday Sunday    PWR! Moves Standing            Shoulder exercises            Exercises to lessen freezing episodes                        Walking            Aerobic Activity

## 2021-09-26 ENCOUNTER — Ambulatory Visit: Payer: Medicare Other | Admitting: Physical Therapy

## 2021-09-26 ENCOUNTER — Other Ambulatory Visit: Payer: Self-pay

## 2021-09-26 DIAGNOSIS — R2689 Other abnormalities of gait and mobility: Secondary | ICD-10-CM | POA: Diagnosis not present

## 2021-09-26 DIAGNOSIS — R293 Abnormal posture: Secondary | ICD-10-CM | POA: Diagnosis not present

## 2021-09-26 DIAGNOSIS — R471 Dysarthria and anarthria: Secondary | ICD-10-CM | POA: Diagnosis not present

## 2021-09-26 DIAGNOSIS — R2681 Unsteadiness on feet: Secondary | ICD-10-CM

## 2021-09-26 NOTE — Therapy (Signed)
[facility] [address], [place], [place], [address] Phone: [phone]   Fax:  [phone]  Physical Therapy Treatment/Discharge Summary  Patient Details  Name: [name] [name] MRN: [id number] Date of Birth: 1951-05-25 Referring Provider (PT): Tat, Jasper THERAPY DISCHARGE SUMMARY  Visits from Start of Care: 12  Current functional level related to goals / functional outcomes:  PT Long Term Goals - 09/26/21 1617       PT LONG TERM GOAL #1   Title Pt will be independent with progression of HEP and community fitness plans upon d/c from PT for continuing to maximize gains made in PT.  TARGET 10/04/21    Time 8    Period Weeks    Status Achieved      PT LONG TERM GOAL #2   Title Pt will improve gait velocity to at least 4 ft/sec for improved gait efficiency and safety in the community.    Baseline 3.16 ft/sec; 4.02 ft/sec 09/24/2021    Time 8    Period Weeks    Status Achieved      PT LONG TERM GOAL #3   Title Pt will improve MiniBESTest score to at least 25/28 to decrease fall risk.    Baseline 22/28; improved  to 27/28 09/24/2021    Time 8    Period Weeks    Status Achieved      PT LONG TERM GOAL #4   Title Pt will improve TUG/TUG cognitive to <10% difference for improved dual taskign with gait.    Baseline 10.06, 12.07 sec cog; goal met 09/24/2021    Time 8    Period Weeks    Status Achieved            Pt has met all 4 of 4 LTGs.   Remaining deficits: Posture, balance, festination of gait (improving with exercise and use of strategies)   Education / Equipment: Educated in [employer], strategies to reduce freezing episodes, optimal PD fitness post therapy-discharge.   Patient agrees to discharge. Patient goals were met. Patient is being discharged due to meeting the stated rehab goals.  Recommend return PT (and pt requests OT) evals in 6 months due to progressive nature of disease process.  Mady Haagensen,  PT 09/26/21 4:24 PM Phone: [phone] Fax: [phone]    Encounter Date: 09/26/2021   PT End of Session - 09/26/21 1107     Visit Number 12    Number of Visits 17    Date for PT Re-Evaluation 10/04/21    PT Start Time 1105    PT Stop Time 0370    PT Time Calculation (min) 39 min    Activity Tolerance Patient tolerated treatment well    Behavior During Therapy WFL for tasks assessed/performed             Past Medical History:  Diagnosis Date   Cervical lymphadenopathy    right - followed my ENT   Dystonia 1994   diagnosed in [place]   Non Hodgkin's lymphoma ([place]) 2007   diffuse- 6 cycles of chemo with R CHOP Rituxan - last dose 10/08   Parkinson disease ([place])    2015    Past Surgical History:  Procedure Laterality Date   CHOLECYSTECTOMY, LAPAROSCOPIC     EYE SURGERY     x2 as child   PORT-A-CATH REMOVAL     PORTACATH PLACEMENT     SPLENECTOMY     TONSILLECTOMY  There were no vitals filed for this visit.   Subjective Assessment - 09/26/21 1107     Subjective No changes since last visit.    Patient Stated Goals Pt's goals are to not fall and to move better overall; better posture.    Currently in Pain? No/denies                  Reviewed full HEP:   Pt performs PWR! Moves in standing position x 10 reps-Reviewed initial HEP instructions (pt is continuing to perform at home, but needs reminders for hold at end ranges for attention to posture in all positions)   PWR! Up for improved posture  PWR! Rock for improved weighshifting  PWR! Twist for improved trunk rotation   PWR! Step for improved step initiation   Reviewed tips to help decrease freezing episodes later in the evening: In sitting:  (pt demonstrates and reports doing on a regular basis) -March in place x 5-10 reps with emphasis on stomping. -Kick leg forward 5-10 reps, alternating legs, with emphasis on Big kick   In standing: -March in place x 5-10 reps with emphasis on  stomping to lift feet   Four square step activity/Box step: Step forward, Right, Back, then Left, with BIG step for foot clearance each direction (clockwise); then reverse to step in counter clockwise direction.  Repeat 3 times each direction  -Repeated 2 reps each direction, stepping over obstacle (boomwhackers) to increase step height and foot clearance.              [facility] Adult PT Treatment/Exercise - 09/26/21 0001       Ambulation/Gait   Ambulation/Gait Yes    Ambulation/Gait Assistance 7: Independent    Ambulation Distance (Feet) 800 Feet    Assistive device None    Gait Pattern Step-through pattern;Lateral trunk lean to right;Decreased trunk rotation    Ambulation Surface Level;Indoor;Unlevel;Outdoor;Paved      Self-Care   Self-Care Other Self-Care Comments    Other Self-Care Comments  Discussed progress towards goals, POC including discharge this visit.  Discussed optimal community fitness, and pt able to verbalize HEP, walking program and aerobic activity.  Discussed how to increase endurance for walking (currently at 5 minutes), with recommendation to add 1-2 minutes per week to goal of at least 20 minutes.  Discussed follow up recommendations:  screen versus eval and pt would like to schedule return eval (for OT and PT).  Scheduled today, with PT in agreement for this.                     PT Education - 09/26/21 1616     Education Details POC, plans for d/c, optimal fitness program; plans for return PT and OT eval in 6 months.    Person(s) Educated Patient    Methods Explanation    Comprehension Verbalized understanding              PT Short Term Goals - 09/03/21 1334       PT SHORT TERM GOAL #1   Title Pt will be independent with HEP for improved posture, strength, balance, transfers, and gait.  TARGET 09/06/2021    Time 4    Period Weeks    Status Achieved      PT SHORT TERM GOAL #2   Title Pt will verbalize tips to reduce freezing  episodes for improved gait in home and community environment.    Time 4    Period Weeks  Status Achieved      PT SHORT TERM GOAL #3   Title Pt will improve push and release test posteriorly, to <2 steps independently regaining balance, for improved balance reactions.    Baseline 4 steps    Time 4    Period Weeks    Status Achieved               PT Long Term Goals - 09/26/21 1617       PT LONG TERM GOAL #1   Title Pt will be independent with progression of HEP and community fitness plans upon d/c from PT for continuing to maximize gains made in PT.  TARGET 10/04/21    Time 8    Period Weeks    Status Achieved      PT LONG TERM GOAL #2   Title Pt will improve gait velocity to at least 4 ft/sec for improved gait efficiency and safety in the community.    Baseline 3.16 ft/sec; 4.02 ft/sec 09/24/2021    Time 8    Period Weeks    Status Achieved      PT LONG TERM GOAL #3   Title Pt will improve MiniBESTest score to at least 25/28 to decrease fall risk.    Baseline 22/28; improved  to 27/28 09/24/2021    Time 8    Period Weeks    Status Achieved      PT LONG TERM GOAL #4   Title Pt will improve TUG/TUG cognitive to <10% difference for improved dual taskign with gait.    Baseline 10.06, 12.07 sec cog; goal met 09/24/2021    Time 8    Period Weeks    Status Achieved                   Plan - 09/26/21 1617     Clinical Impression Statement Assessed pt's HEP and plans for optimal fitness upon d/c from PT, with pt return demo and verbalizing understanding.  Pt has met LTG 1, with pt now meeting all LTGs.  He has demonstrated understanding of HEP to address stretching, trunk flexibility, balance, as well as understanding of optimal fitness program now that PT is discharging.  He reports utilizing strategies to reduce freezing episodes.  He is appropriate for d/c at this time, with plans for return PT evaluation in 6 months.    Personal Factors and Comorbidities Comorbidity  3+    Comorbidities MH:  cervical lymphadenopathy, dystonia, Non Hodgkin's lymphoma    Examination-Activity Limitations Locomotion Level;Transfers;Stand    Examination-Participation Restrictions Community Activity;Other   Hobbies   Stability/Clinical Decision Making Evolving/Moderate complexity    Rehab Potential Good    PT Frequency 2x / week    PT Duration 8 weeks   plus eval   PT Treatment/Interventions ADLs/Self Care Home Management;Gait training;Functional mobility training;Therapeutic activities;Therapeutic exercise;Balance training;Neuromuscular re-education;Manual techniques;Patient/family education;Passive range of motion    PT Next Visit Plan D/C this visit; plan for return PT eval in 6 months    Recommended Other Services Pt requests OT eval in 6 months (scheduled at PT d/c)    Consulted and Agree with Plan of Care Patient             Patient will benefit from skilled therapeutic intervention in order to improve the following deficits and impairments:  Abnormal gait, Difficulty walking, Decreased balance, Postural dysfunction, Decreased strength, Decreased mobility  Visit Diagnosis: Other abnormalities of gait and mobility  Unsteadiness on feet  Abnormal posture  Problem List Patient Active Problem List   Diagnosis Date Noted   Healthcare maintenance 06/28/2020   Gastroesophageal reflux disease 03/07/2020   Localized swelling of right lower extremity 04/12/2019   DOE (dyspnea on exertion) 07/15/2018   10 year risk of MI or stroke 7.5% or greater 04/12/2018   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, [place], [place], [address] Phone: [phone]   Fax:  [phone]  Name: PHYLLIS WHITEFIELD MRN: [id number] Date of Birth: [birth year]

## 2021-10-01 ENCOUNTER — Ambulatory Visit: Payer: Medicare Other

## 2021-10-01 ENCOUNTER — Other Ambulatory Visit: Payer: Self-pay

## 2021-10-01 DIAGNOSIS — R2689 Other abnormalities of gait and mobility: Secondary | ICD-10-CM | POA: Diagnosis not present

## 2021-10-01 DIAGNOSIS — R471 Dysarthria and anarthria: Secondary | ICD-10-CM

## 2021-10-01 DIAGNOSIS — R293 Abnormal posture: Secondary | ICD-10-CM | POA: Diagnosis not present

## 2021-10-01 DIAGNOSIS — R2681 Unsteadiness on feet: Secondary | ICD-10-CM | POA: Diagnosis not present

## 2021-10-01 NOTE — Therapy (Signed)
[facility] [address] [place], [place], [address] Phone: [phone]   Fax:  [phone]  Speech Language Pathology Treatment/Discharge summary  Patient Details  Name: [name] [name] MRN: [id number] Date of Birth: 12-20-1950 Referring Provider (SLP): Alonza Bogus, DO   Encounter Date: 10/01/2021   End of Session - 10/01/21 1736     Visit Number 12    Number of Visits 17    Date for SLP Re-Evaluation 10/14/21   extended one week due to holiday   SLP Start Time 1632    SLP Stop Time  1700    SLP Time Calculation (min) 28 min    Activity Tolerance Patient tolerated treatment well             Past Medical History:  Diagnosis Date   Cervical lymphadenopathy    right - followed my ENT   Dystonia 1994   diagnosed in [place]   Non Hodgkin's lymphoma ([place]) 2007   diffuse- 6 cycles of chemo with R CHOP Rituxan - last dose 10/08   Parkinson disease ([place])    2015    Past Surgical History:  Procedure Laterality Date   CHOLECYSTECTOMY, LAPAROSCOPIC     EYE SURGERY     x2 as child   PORT-A-CATH REMOVAL     PORTACATH PLACEMENT     SPLENECTOMY     TONSILLECTOMY      There were no vitals filed for this visit.   SPEECH THERAPY DISCHARGE SUMMARY  Visits from Start of Care: 6  Current functional level related to goals / functional outcomes: Pt's timeliness to appointments was less frequent, and pt arrived talking about other appointments and family matters in the previous 2-3 appointments. He was able to maintain upper 60s dB for 20 minutes mod complex conversation and SLP ascertained pt would like to d/c today and spend more time with family.    Remaining deficits: See below   Education / Equipment: Volume compensations.   Patient agrees to discharge. Patient goals were partially met. Patient is being discharged due to the patient's request..     Subjective Assessment - 10/01/21 1730     Subjective Pt arrives  15 minutes late today. Reports family matters are becoming more acute.    Currently in Pain? No/denies                   ADULT SLP TREATMENT - 10/01/21 1634       General Information   Behavior/Cognition Alert;Cooperative;Pleasant mood      Treatment Provided   Treatment provided Cognitive-Linquistic      Cognitive-Linquistic Treatment   Treatment focused on Dysarthria    Skilled Treatment Arrived 15 minutes late. In moderate complex conversation 20 minutes [name]'s volume was in upper 60s average. Pt needed to cancel two appointmetns in the next 2 weeks - given his performance today, tardiness today and new these cancels SLP inquired if d/c today would be best for pt and he stated it would be best.      Assessment / Recommendations / Plan   Plan Continue with current plan of care      Progression Toward Goals   Progression toward goals --   d/c day - see goals             SLP Education - 10/01/21 1734     Education Details pt will have to maintain loud /a/, and everyday sentence practice as well as projecting past the  person he is talking to    [employer]) Educated Patient    Methods Explanation    Comprehension Verbalized understanding              SLP Short Term Goals - 09/02/21 1555       SLP SHORT TERM GOAL #1   Title Pt will achieve 90dB average on loud /a/ over 3 sessions    Baseline 08-08-21, 08-12-21    Status Achieved    Target Date 08/30/21      SLP SHORT TERM GOAL #2   Title Pt will mainitain average of 70dB during sentence level responses with rare min cues over two sessions    Baseline 08-19-21    Status Achieved    Target Date 08/30/21      SLP SHORT TERM GOAL #3   Title In 5 minutes simple conversation pt will achieve average speech volume of low 70s dB over two sessions    Baseline upper 60s dB (08-29-21)    Time 4    Period Weeks    Status Partially Met    Target Date 08/30/21      SLP SHORT TERM GOAL #4   Title Pt will use  abdominal breathing 75% of the time in 5 minutes simple conversation in 2 sessions    Baseline 08-19-21    Status Achieved    Target Date 08/30/21              SLP Long Term Goals - 10/01/21 1739       SLP LONG TERM GOAL #1   Title Pt will maintain average of low 70s dB over 10 minute simple conversation with rare min A over three sessions    Time 9    Period Weeks    Status Not Met      SLP LONG TERM GOAL #2   Title Pt will be audible over 15 minute conversation in noisy environment/outside of therapy room with min rare A x 2 sessions    Time 9    Period Weeks    Status Not Met      SLP LONG TERM GOAL #3   Title Pt will report less frequent requests to repeat himself outside of [place] room over 2 visits.    Baseline 08-26-21    Status Achieved      SLP LONG TERM GOAL #4   Title pt will use abdominal breathing 75% of the time in 10 minutes simple conversation in three sessions    Baseline 08-26-21, 09-03-21    Status Achieved              Plan - 10/01/21 1737     Clinical Impression Statement Pt continues to present today with mild-mod hypokinetic dysarthria due to parkinson's disease (PD), with abdominal breathing. Pt cont to reports WNL eating/drinking, denying overt s/s aspiration during meals. Pt observed today by SLP to upper 60s dB avergae with conversational volume in 20 minutes mod complex conversation. Pt cancelled two appointments in next two weeks due to scheduling conflicts and was 15 minutes late today. SLP asked pt if d/c today would be best and re-eval in June and pt agreed.    Treatment/Interventions SLP instruction and feedback;Compensatory strategies;Patient/family education;Functional tasks;Multimodal communcation approach;Internal/external aids;Environmental controls    Potential to [place] provided today - /a/ and everyday sentences    Consulted and Agree with Plan of Care Patient  Patient will  benefit from skilled therapeutic intervention in order to improve the following deficits and impairments:   Dysarthria and anarthria    Problem List Patient Active Problem List   Diagnosis Date Noted   Healthcare maintenance 06/28/2020   Gastroesophageal reflux disease 03/07/2020   Localized swelling of right lower extremity 04/12/2019   DOE (dyspnea on exertion) 07/15/2018   10 year risk of MI or stroke 7.5% or greater 04/12/2018   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, [place] [employer], [place], [address] Phone: [phone]   Fax:  [phone]   Name: TODDRICK SANNA MRN: [id number] Date of Birth: November 20, 1950

## 2021-10-28 NOTE — Progress Notes (Signed)
Assessment/Plan:   1.  Parkinsons Disease  -Continue carbidopa/levodopa 25/100, 1 tablet at 7 AM/11 AM/3 PM/7 PM  -carbidopa/levodopa 50/200 CR q hs to see if helps with first AM on  -Continue pramipexole, 0.75 mg 3 times per day  -We discussed that it used to be thought that levodopa would increase risk of melanoma but now it is believed that Parkinsons itself likely increases risk of melanoma. he is to get regular skin checks.   2.  Meige syndrome  -Stable  -no tx needed right now  3.  Insomnia/mild RBD  -Continue clonazepam, 0.5 mg, 1/2 tablet at night.  Higher dosages didn't help and had hang over effect  4.  Memory change  -suspect mci.  Pramipexole could be contributing  -will schedule neurocog testing  5.  Constipation  -increase water intake, decrease diet pepsi   Subjective:   [name] [name] was seen today in follow up for Parkinsons disease.  My previous records were reviewed prior to todays visit as well as outside records available to me. Wife with patient and supplements history.  PT/ST notes are reviewed.  Forgot to get derm visit.  Has some constipation.  Not drinking much water  - drinking diet pepsi.  Noting some trouble with first AM on.  Some trouble with remembering to take med, despite reminder on watch.  Wife thinks more forgetful.  Current prescribed movement disorder medications: Pramipexole, 0.75 mg 3 times daily Carbidopa/levodopa 25/100, 1 tablet at 7 AM/11 AM/3 PM/7 PM (increased by 1 pill last visit) clonazepam, 0.5 mg, 1 tablet at bedtime    ALLERGIES:  No Known Allergies  CURRENT MEDICATIONS:  Outpatient Encounter Medications as of 10/31/2021  Medication Sig   aspirin 81 MG chewable tablet Chew 81 mg by mouth daily.   carbidopa-levodopa (SINEMET IR) 25-100 MG tablet Take 1 tablet by mouth 4 (four) times daily. 7am/11am/3pm/7pm   Cholecalciferol (VITAMIN D3) 3000 UNITS TABS Take 1,000 Units by mouth daily.    clonazePAM (KLONOPIN) 0.5 MG  tablet TAKE 0.5 TABLETS (0.25 MG TOTAL) BY MOUTH AT BEDTIME.   COVID-19 mRNA bivalent vaccine, Pfizer, (PFIZER COVID-19 VAC BIVALENT) injection Inject into the muscle.   cyanocobalamin 100 MCG tablet Take 1,000 mcg by mouth daily.    famotidine (PEPCID) 10 MG tablet Take 10 mg by mouth 2 (two) times daily.   Melatonin 3 MG TABS Take 3 mg by mouth at bedtime.    pramipexole (MIRAPEX) 0.75 MG tablet Take 1 tablet (0.75 mg total) by mouth 3 (three) times daily.   No facility-administered encounter medications on file as of 10/31/2021.    Objective:   PHYSICAL EXAMINATION:    VITALS:   Vitals:   10/31/21 0813  BP: 122/73  Pulse: 72  SpO2: 93%  Weight: 206 lb (93.4 kg)  Height: 5\' 6"  (1.676 m)      GEN:  The patient appears stated age and is in NAD. HEENT:  Normocephalic, atraumatic.  The mucous membranes are moist.    Neurological examination:  Orientation: The patient is alert and oriented x3. Cranial nerves: There is good facial symmetry with facial hypomimia. The speech is fluent and pseudobulbar. Soft palate rises symmetrically and there is no tongue deviation. Hearing is intact to conversational tone. Sensation: Sensation is intact to light touch throughout Motor: Strength is at least antigravity x4.  Movement examination: Tone: There is nl tone in the ue/le Abnormal movements: none Coordination:  There is no decremation with RAM's, with any form of  RAMS, including alternating supination and pronation of the forearm, hand opening and closing, finger taps, heel taps and toe taps. Gait and Station: The patient has no difficulty arising out of a deep-seated chair without the use of the hands. The patient's stride length is slightly decreased with pisa syndrome to the R.  He is slow to ambulate.  This is all stable from previous  I have reviewed and interpreted the following labs independently    Chemistry      Component Value Date/Time   NA 140 08/14/2021 0906   K 4.3  08/14/2021 0906   CL 104 08/14/2021 0906   CO2 29 08/14/2021 0906   BUN 15 08/14/2021 0906   CREATININE 1.13 08/14/2021 0906   CREATININE 0.94 09/26/2014 0925      Component Value Date/Time   CALCIUM 9.1 08/14/2021 0906   ALKPHOS 53 08/14/2021 0906   AST 26 08/14/2021 0906   ALT 13 08/14/2021 0906   BILITOT 0.7 08/14/2021 0906       Lab Results  Component Value Date   WBC 9.1 04/12/2018   HGB 15.5 04/12/2018   HCT 45.4 04/12/2018   MCV 98.0 04/12/2018   PLT 307.0 04/12/2018    Lab Results  Component Value Date   TSH 1.67 03/27/2015     Total time spent on today's visit was 32  minutes, including both face-to-face time and nonface-to-face time.  Time included that spent on review of records (prior notes available to me/labs/imaging if pertinent), discussing treatment and goals, answering patient's questions and coordinating care.  Cc:  [name], DO

## 2021-10-31 ENCOUNTER — Other Ambulatory Visit: Payer: Self-pay

## 2021-10-31 ENCOUNTER — Ambulatory Visit (INDEPENDENT_AMBULATORY_CARE_PROVIDER_SITE_OTHER): Payer: Medicare Other | Admitting: Neurology

## 2021-10-31 ENCOUNTER — Encounter: Payer: Self-pay | Admitting: Neurology

## 2021-10-31 VITALS — BP 122/73 | HR 72 | Ht 66.0 in | Wt 206.0 lb

## 2021-10-31 DIAGNOSIS — R413 Other amnesia: Secondary | ICD-10-CM

## 2021-10-31 DIAGNOSIS — G20 Parkinson's disease: Secondary | ICD-10-CM | POA: Diagnosis not present

## 2021-10-31 MED ORDER — CARBIDOPA-LEVODOPA 25-100 MG PO TABS: 1.0000 | ORAL_TABLET | Freq: Four times a day (QID) | ORAL | 2 refills | Status: DC

## 2021-10-31 MED ORDER — CARBIDOPA-LEVODOPA ER 50-200 MG PO TBCR: 1.0000 | EXTENDED_RELEASE_TABLET | Freq: Every day | ORAL | 2 refills | Status: DC

## 2021-10-31 NOTE — Patient Instructions (Addendum)
Add carbidopa/levodopa 50/200 CR at bedtime  You have been referred for a neurocognitive evaluation (i.e., evaluation of memory and thinking abilities). Please bring someone with you to this appointment if possible, as it is helpful for the neuropsychologist to hear from both you and another adult who knows you well. Please bring eyeglasses and hearing aids if you wear them and take any medications as you normally would.    The evaluation will take approximately 2-3 hours and has two parts:   The first part is a clinical interview with the neuropsychologist, Dr. Melvyn Novas.  During the interview, the neuropsychologist will speak with you and the individual you brought to the appointment.    The second part of the evaluation is testing with the doctor's technician, aka psychometrician, Hinton Dyer or [employer]. During the testing, the technician will ask you to remember different types of material, solve problems, and answer some questionnaires. Your family member will not be present for this portion of the evaluation.   Please note: We have to reserve several hours of the neuropsychologist's time and the psychometrician's time for your evaluation appointment. As such, there is a No-Show fee of $100. If you are unable to attend any of your appointments, please contact our office as soon as possible to reschedule.

## 2022-01-10 ENCOUNTER — Ambulatory Visit (INDEPENDENT_AMBULATORY_CARE_PROVIDER_SITE_OTHER): Payer: Medicare Other | Admitting: Psychology

## 2022-01-10 ENCOUNTER — Encounter: Payer: Self-pay | Admitting: Psychology

## 2022-01-10 ENCOUNTER — Ambulatory Visit: Payer: Medicare Other

## 2022-01-10 DIAGNOSIS — F0670 Mild neurocognitive disorder due to known physiological condition without behavioral disturbance: Secondary | ICD-10-CM | POA: Diagnosis not present

## 2022-01-10 DIAGNOSIS — G20 Parkinson's disease: Secondary | ICD-10-CM

## 2022-01-10 HISTORY — DX: Parkinson's disease: G20

## 2022-01-10 HISTORY — DX: Mild neurocognitive disorder due to known physiological condition without behavioral disturbance: F06.70

## 2022-01-10 NOTE — Progress Notes (Signed)
? ?NEUROPSYCHOLOGICAL EVALUATION ?[facility] ?[place] Department of Neurology ? ?Date of Evaluation: January 10, 2022 ? ?Reason for Referral:  ? ?[name] [name] is a 71 y.o. right-handed Caucasian male referred by Alonza Bogus, D.O., to characterize his current cognitive functioning and assist with diagnostic clarity and treatment planning in the context of Parkinson's disease and concern for ongoing cognitive decline.  ? ?Assessment and Plan:  ? ?Clinical Impression(s): ?[name] [name]'s pattern of performance is suggestive of isolated impaired performances across complex attention and semantic fluency. Performance variability was also exhibited across all aspects of learning and memory. Performances were appropriate across processing speed, basic attention, executive functioning, safety/judgment, receptive language, phonemic fluency, confrontation naming, and visuospatial abilities. [name] [name] denied difficulties completing instrumental activities of daily living (ADLs) independently. As such, given evidence for cognitive dysfunction described above, he meets criteria for a Mild Neurocognitive Disorder ("mild cognitive impairment") at the present time. ? ?Regarding etiology, the most likely culprit for ongoing cognitive weakness remains his past history of Parkinson's disease. While processing speed was improved relative to what might be expected, dysfunction surrounding complex attention and encoding/retrieval deficits of memory are quite common in this illness. The fact that motor/gait dysfunction was noted years prior to concern surrounding cognitive decline is also a typical timeline associated with Parkinson's disease. Mild anxiety and sleep dysfunction could further influence cognitive performances. Research surrounding cognitive side effects of pramipexole is mixed; however, I cannot rule out an ongoing side effect contribution as well.  ? ?Despite variability, current memory dysfunction  is not concerning for Alzheimer's disease at the present time. Behavioral and cognitive patterns of performance are not consistent with Lewy body dementia or frontotemporal dementia. He does not appear to exhibit behavioral signs of another more rare parkinsonian syndrome. I cannot rule out a very minor vascular contribution given that I was unable to review any neuroimaging more recent than his 2015 scan (which was unremarkable). However, given his lack of cardiovascular medical ailments, this would seem unlikely. Continued medical monitoring will be important moving forward. ? ?Recommendations: ?A repeat neuropsychological evaluation in 18-24 months (or sooner if functional decline is noted) could be considered to assess the trajectory of future cognitive decline should it occur. This will also aid in future efforts towards improved diagnostic clarity. ? ?He is encouraged to continue regular follow-up with his neurologist for ongoing management of symptoms relating to Parkinson's disease.  ? ?[name] [name] is encouraged to attend to lifestyle factors for brain health (e.g., regular physical exercise, good nutrition habits, regular participation in cognitively-stimulating activities, and general stress management techniques), which are likely to have benefits for both emotional adjustment and cognition. In fact, in addition to promoting good general health, regular exercise incorporating aerobic activities (e.g., brisk walking, jogging, cycling, etc.) has been demonstrated to be a very effective treatment for depression and stress, with similar efficacy rates to both antidepressant medication and psychotherapy. Optimal control of vascular risk factors (including safe cardiovascular exercise and adherence to dietary recommendations) is encouraged. Continued participation in activities which provide mental stimulation and social interaction is also recommended.  ? ?Performance across neurocognitive testing is not a  strong predictor of an individual's safety operating a motor vehicle. Should he or his family wish to pursue a formalized driving evaluation, they could reach out to the following agencies: ?[employer] in [place]: [phone] ?[facility]: [phone] ?[facility]: [phone] ?[facility]: [phone] or [phone] ? ?When learning new information, he would benefit from  information being broken up into small, manageable pieces. He may also find it helpful to articulate the material in his own words and in a context to promote encoding at the onset of a new task. This material may need to be repeated multiple times to promote encoding. ? ?Memory can be improved using internal strategies such as rehearsal, repetition, chunking, mnemonics, association, and imagery. External strategies such as written notes in a consistently used memory journal, visual and nonverbal auditory cues such as a calendar on the refrigerator or appointments with alarm, such as on a cell phone, can also help maximize recall.   ? ?To address problems with processing speed, he may wish to consider: ?  -Ensuring that he is alerted when essential material or instructions are being presented ?  -Adjusting the speed at which new information is presented ?  -Allowing for more time in comprehending, processing, and responding in conversation ? ?To address problems with fluctuating attention, he may wish to consider: ?  -Avoiding external distractions when needing to concentrate ?  -Limiting exposure to fast paced environments with multiple sensory demands ?  -Writing down complicated information and using checklists ?  -Attempting and completing one task at a time (i.e., no multi-tasking) ?  -Verbalizing aloud each step of a task to maintain focus ?  -Reducing the amount of information considered at one time ? ?Review of Records:  ? ?[name] [name] was most recently seen by [facility]  Wells Guiles Tat, D.O.) on 10/31/2021 for follow-up of Parkinson's disease. He has seemed largely stable over time. Current medications included: Pramipexole, 0.75 mg 3 times daily, Carbidopa/levodopa 25/100, 1 tablet at 7 AM/11 AM/3 PM/7 PM (increased by 1 pill last visit), and clonazepam, 0.5 mg, 1 tablet at bedtime. The latter was prescribed to help with sleep dysfunction and mild REM sleep behaviors. He has been involved in outpatient PT and SLP; the latter has been focused on symptoms of hypophonia. Concern was expressed surrounding cognitive changes. Ultimately, Mr. Bass was referred for a comprehensive neuropsychological evaluation to characterize his cognitive abilities and to assist with diagnostic clarity and treatment planning.  ? ?Brain MRI on 08/24/2014 was unremarkable. No other neuroimaging was available for review.  ? ?Past Medical History:  ?Diagnosis Date  ? Cervical lymphadenopathy   ? right - followed by ENT  ? DOE (dyspnea on exertion) 07/15/2018  ? Dysphagia, neurologic 07/27/2014  ? Dystonia 1994  ? diagnosed in [place]  ? Enlarged lymph nodes 07/28/2007  ? Gastroesophageal reflux disease 03/07/2020  ? Localized swelling of right lower extremity 04/12/2019  ? Low back pain 11/21/2011  ? 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 Non-Hodgkin lymphoma 2007  ? diffuse- 6 cycles of chemo with R CHOP Rituxan - last dose 10/08  ? Nonspecific elevation of levels of transaminase or lactic acid dehydrogenase (LDH) 07/06/2008  ? Parkinson's disease 07/27/2014  ? 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  ?Past Surgical History:  ?Procedure Laterality Date  ? CHOLECYSTECTOMY, LAPAROSCOPIC    ? EYE SURGERY    ? x2 as child  ? PORT-A-CATH REMOVAL    ? PORTACATH PLACEMENT    ? SPLENECTOMY    ? TONSILLECTOMY    ?  ?Current Outpatient Medications:  ?  aspirin 81 MG chewable tablet, Chew 81 mg by mouth daily., Disp: , Rfl:  ?  carbidopa-levodopa (SINEMET CR) 50-200 MG tablet, Take 1 tablet by  mouth at bedtime., Disp: 90 tablet, Rfl: 2 ?  carbidopa-levodopa (SINEMET IR) 25-100 MG tablet,  Take 1 tablet by mouth 4 (four) times daily. 7am/11am/3pm/7pm, Disp: 360 tablet, Rfl: 2 ?  Cholecalciferol (VITAMI

## 2022-01-10 NOTE — Progress Notes (Signed)
? ?  Psychometrician Note ?  ?Cognitive testing was administered to [name] [name] by Cruzita Lederer, B.S. (psychometrist) under the supervision of Dr. Christia Reading, Ph.D., licensed psychologist on 01/10/2022. [name] [name] did not appear overtly distressed by the testing session per behavioral observation or responses across self-report questionnaires. Rest breaks were offered.  ?  ?The battery of tests administered was selected by Dr. Christia Reading, Ph.D. with consideration to [name] [name] current level of functioning, the nature of his symptoms, emotional and behavioral responses during interview, level of literacy, observed level of motivation/effort, and the nature of the referral question. This battery was communicated to the psychometrist. Communication between Dr. Christia Reading, Ph.D. and the psychometrist was ongoing throughout the evaluation and Dr. Christia Reading, Ph.D. was immediately accessible at all times. Dr. Christia Reading, Ph.D. provided supervision to the psychometrist on the date of this service to the extent necessary to assure the quality of all services provided.  ?  ?[name] [name] will return within approximately 1-2 weeks for an interactive feedback session with Dr. Melvyn Novas at which time his test performances, clinical impressions, and treatment recommendations will be reviewed in detail. Mr. Hjort understands he can contact our office should he require our assistance before this time. ? ?A total of 135 minutes of billable time were spent face-to-face with Mr. Marlatt by the psychometrist. This includes both test administration and scoring time. Billing for these services is reflected in the clinical report generated by Dr. Christia Reading, Ph.D. ? ?This note reflects time spent with the psychometrician and does not include test scores or any clinical interpretations made by Dr. Melvyn Novas. The full report will follow in a separate note.  ?

## 2022-01-16 ENCOUNTER — Ambulatory Visit (INDEPENDENT_AMBULATORY_CARE_PROVIDER_SITE_OTHER): Payer: Medicare Other | Admitting: Psychology

## 2022-01-16 DIAGNOSIS — G20 Parkinson's disease: Secondary | ICD-10-CM

## 2022-01-16 DIAGNOSIS — F0670 Mild neurocognitive disorder due to known physiological condition without behavioral disturbance: Secondary | ICD-10-CM | POA: Diagnosis not present

## 2022-01-16 NOTE — Progress Notes (Signed)
? ?  Neuropsychology Feedback Session ?[facility] ?[place] Department of Neurology ? ?Reason for Referral:  ? ?[name] [name] is a 71 y.o. right-handed Caucasian male referred by Alonza Bogus, D.O., to characterize his current cognitive functioning and assist with diagnostic clarity and treatment planning in the context of Parkinson's disease and concern for ongoing cognitive decline.  ? ?Feedback:  ? ?[name] [name] completed a comprehensive neuropsychological evaluation on 01/10/2022. Please refer to that encounter for the full report and recommendations. Briefly, results suggested isolated impaired performances across complex attention and semantic fluency. Performance variability was also exhibited across all aspects of learning and memory. Regarding etiology, the most likely culprit for ongoing cognitive weakness remains his past history of Parkinson's disease. While processing speed was improved relative to what might be expected, dysfunction surrounding complex attention and encoding/retrieval deficits of memory are quite common in this illness. The fact that motor/gait dysfunction was noted years prior to concern surrounding cognitive decline is also a typical timeline associated with Parkinson's disease. Mild anxiety and sleep dysfunction could further influence cognitive performances. Research surrounding cognitive side effects of pramipexole is mixed; however, I cannot rule out an ongoing side effect contribution as well.  ? ?[name] [name] was accompanied by his wife during the current feedback session. Content of the current session focused on the results of his neuropsychological evaluation. [name] [name] was given the opportunity to ask questions and his questions were answered. He was encouraged to reach out should additional questions arise. A copy of his report was provided at the conclusion of the visit.  ? ?  ? ?25 minutes were spent conducting the current feedback session with Mr.  [name], billed as one unit [phone].  ?

## 2022-01-17 ENCOUNTER — Encounter: Payer: Medicare Other | Admitting: Psychology

## 2022-01-30 ENCOUNTER — Telehealth: Payer: Self-pay | Admitting: Physical Therapy

## 2022-01-30 DIAGNOSIS — G20 Parkinson's disease: Secondary | ICD-10-CM

## 2022-01-30 NOTE — Telephone Encounter (Signed)
Hello,  ?[name] [name] is scheduled for return PT, OT, speech therapy evaluations in June, which patient agreed upon at his previous therapy discharge.  Could you please send in PT, OT, speech therapy orders please? ? ?Thank you.   ? ?Mady Haagensen, PT ?01/30/22 1:19 PM ?Phone: [phone] ?Fax: [phone] ? ?

## 2022-02-25 ENCOUNTER — Ambulatory Visit: Payer: Medicare Other | Admitting: Physical Therapy

## 2022-02-25 ENCOUNTER — Encounter: Payer: Medicare Other | Admitting: Occupational Therapy

## 2022-03-04 ENCOUNTER — Other Ambulatory Visit: Payer: Self-pay

## 2022-03-04 ENCOUNTER — Ambulatory Visit: Payer: Medicare Other | Admitting: Occupational Therapy

## 2022-03-04 ENCOUNTER — Ambulatory Visit: Payer: Medicare Other | Attending: Neurology | Admitting: Physical Therapy

## 2022-03-04 ENCOUNTER — Ambulatory Visit: Payer: Medicare Other

## 2022-03-04 DIAGNOSIS — R41841 Cognitive communication deficit: Secondary | ICD-10-CM | POA: Diagnosis not present

## 2022-03-04 DIAGNOSIS — G20 Parkinson's disease: Secondary | ICD-10-CM | POA: Insufficient documentation

## 2022-03-04 DIAGNOSIS — R278 Other lack of coordination: Secondary | ICD-10-CM | POA: Diagnosis not present

## 2022-03-04 DIAGNOSIS — R2689 Other abnormalities of gait and mobility: Secondary | ICD-10-CM | POA: Insufficient documentation

## 2022-03-04 DIAGNOSIS — R2681 Unsteadiness on feet: Secondary | ICD-10-CM | POA: Insufficient documentation

## 2022-03-04 DIAGNOSIS — R4701 Aphasia: Secondary | ICD-10-CM | POA: Insufficient documentation

## 2022-03-04 DIAGNOSIS — R471 Dysarthria and anarthria: Secondary | ICD-10-CM

## 2022-03-04 DIAGNOSIS — R293 Abnormal posture: Secondary | ICD-10-CM

## 2022-03-04 DIAGNOSIS — R29818 Other symptoms and signs involving the nervous system: Secondary | ICD-10-CM | POA: Diagnosis not present

## 2022-03-04 NOTE — Therapy (Signed)
OUTPATIENT OCCUPATIONAL THERAPY NEURO EVALUATION  Patient Name: [name] [name] MRN: [id number] DOB:1951-06-09, 71 y.o., male Today's Date: 03/04/2022  PCP: [name], DO REFERRING PROVIDER: Ludwig Clarks, DO    OT End of Session - 03/04/22 1435     Visit Number 1    Number of Visits 9    Date for OT Re-Evaluation 05/02/22    Authorization Type Medicare A & B    OT Start Time 1245    OT Stop Time 1320    OT Time Calculation (min) 35 min    Activity Tolerance Patient tolerated treatment well    Behavior During Therapy WFL for tasks assessed/performed             Past Medical History:  Diagnosis Date   Cervical lymphadenopathy    right - followed by ENT   DOE (dyspnea on exertion) 07/15/2018   Dysphagia, neurologic 07/27/2014   Dystonia 1994   diagnosed in [place]   Enlarged lymph nodes 07/28/2007   Gastroesophageal reflux disease 03/07/2020   Localized swelling of right lower extremity 04/12/2019   Low back pain 11/21/2011   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   Mild neurocognitive disorder due to Parkinson's disease 01/10/2022   Non-Hodgkin lymphoma 2007   diffuse- 6 cycles of chemo with R CHOP Rituxan - last dose 10/08   Nonspecific elevation of levels of transaminase or lactic acid dehydrogenase (LDH) 07/06/2008   Parkinson's disease 07/27/2014   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   Past Surgical History:  Procedure Laterality Date   CHOLECYSTECTOMY, LAPAROSCOPIC     EYE SURGERY     x2 as child   PORT-A-CATH REMOVAL     PORTACATH PLACEMENT     SPLENECTOMY     TONSILLECTOMY     Patient Active Problem List   Diagnosis Date Noted   Mild neurocognitive disorder due to Parkinson's disease 01/10/2022   Gastroesophageal reflux disease 03/07/2020   Localized swelling of right lower extremity 04/12/2019   DOE (dyspnea on exertion) 07/15/2018   10 year risk of MI or stroke 7.5% or greater 04/12/2018   Post-splenectomy  04/02/2015   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   Dysphagia, neurologic 07/27/2014   Parkinson's disease ([place]) 07/27/2014   Dystonia 07/10/2014   Low back pain 11/21/2011   Nonspecific elevation of levels of transaminase or lactic acid dehydrogenase (LDH) 07/06/2008   Enlarged lymph nodes 07/28/2007   Non-Hodgkin lymphoma 03/23/2007    ONSET DATE: referral date 01/30/22 (diagnosed with PD in 2015)  REFERRING DIAG: G20 (ICD-10-CM) - Parkinson's disease   THERAPY DIAG:  Abnormal posture  Other lack of coordination  Unsteadiness on feet  Other symptoms and signs involving the nervous system  Rationale for Evaluation and Treatment Rehabilitation  SUBJECTIVE:   SUBJECTIVE STATEMENT: Pt reports handwriting has really gone downhill, "impossible to read".  Pt accompanied by: self  PERTINENT HISTORY: PD, Chronic pain R shoulder; PISA syndrome   PRECAUTIONS: Fall  WEIGHT BEARING RESTRICTIONS No  PAIN:  Are you having pain? No  FALLS: Has patient fallen in last 6 months? No  LIVING ENVIRONMENT: Lives with: lives with their spouse Lives in: House/apartment Stairs: Yes: External: w/c ramp in the garage, 2 steps at front entrance, 8 steps at back entrance with ability to reach rail on B sides steps; Internal: 3 flights of steps (man cave is on the 3rd floor) Has following equipment at home: Shower bench, Grab bars, and Ramped entry  PLOF: Independent  PATIENT GOALS a  tune up to maintain progress made during previous OT sessions  OBJECTIVE:   HAND DOMINANCE: Right  ADLs: Transfers/ambulation related to ADLs: Mod I  Eating: Mod I Grooming: Mod I UB Dressing: increased time LB Dressing: increased time Toileting: Mod I Bathing: Mod I, intermittent use of spouse's shower bench Tub Shower transfers: Mod I with use of grab bars into walk-in shower Equipment: Grab bars, Walk in shower, Long handled shoe horn, and Built in tub  bench   IADLs: Shopping: wife does the shopping Light housekeeping: wife does majority of housekeeping Meal Prep: wife does the cooking, will occasionally grill Community mobility: driving self Medication management: uses reminders on his watch, has a small pill box, wife fills pill box and will remind him Financial management: wife pays bills, pt takes care of investments Handwriting: 25% legible  MOBILITY STATUS: Freezing  POSTURE COMMENTS:  rounded shoulders, forward head, and weight shift right  ACTIVITY TOLERANCE: Activity tolerance: WFL for tasks assessed during session  FUNCTIONAL OUTCOME MEASURES: Physical performance test: #2 (self-feeding): 14.94 sec #4 (donning/doffing jacket):9.91 sec, 3 button/unbutton: 31.56*  UPPER EXTREMITY ROM     Active ROM Right eval Left eval  Shoulder flexion    Shoulder abduction    Shoulder adduction    Shoulder extension    Shoulder internal rotation    Shoulder external rotation    Elbow flexion    Elbow extension    Wrist flexion    Wrist extension    Wrist ulnar deviation    Wrist radial deviation    Wrist pronation    Wrist supination    (Blank rows = not tested)   UPPER EXTREMITY MMT:     MMT Right eval Left eval  Shoulder flexion 150 140  Shoulder abduction    Shoulder adduction    Shoulder extension    Shoulder internal rotation WNL WNL  Shoulder external rotation WNL WNL  Middle trapezius    Lower trapezius    Elbow flexion WNL WNL  Elbow extension WNL WNL  Wrist flexion    Wrist extension    Wrist ulnar deviation    Wrist radial deviation    Wrist pronation    Wrist supination    (Blank rows = not tested)  COORDINATION: 9 Hole Peg test: Right: 29.88 sec; Left: 31.53 sec Box and Blocks:  Right 44 blocks, Left 41 blocks  Pt hitting barrier 6 times with R hand Tremors: Resting and reports noticing occasional tremors when eating  SENSATION: WFL  COGNITION: Overall cognitive status: Impaired:  Attention: Impaired: Selective, Alternating, Divided, Memory: Impaired: Working, and Awareness: Impaired: Emergent and Anticipatory and No family/caregiver present to determine baseline cognitive functioning  VISION: Subjective report: "a little bit of double vision in last 6 months, only in R eye" Baseline vision: Wears glasses all the time and progressive lenses Visual history: cataracts  VISION ASSESSMENT: To be further assessed in functional context    TODAY'S TREATMENT:  N/A   PATIENT EDUCATION: Education details: Educated on role and purpose of OT as well as potential interventions and goals for therapy based on initial evaluation findings. Person educated: Patient Education method: Explanation Education comprehension: verbalized understanding   HOME EXERCISE PROGRAM: TBD    GOALS: Goals reviewed with patient? No   LONG TERM GOALS: Target date: 05/02/22 (note: only LTGs due to plan to begin OT in about 3-4 weeks to allow pt to focus treatment on PT and SLP then phase in OT as PT phases out)  STG/ LTG  Status:  1 Pt will be Independent with PD specific HEP Baseline:  Initial  2 Pt will verbalize understanding of adapted strategies and AE to maximize safety and I with ADLs/ IADLs  Baseline:  Initial  3 Pt will demonstrate improved UE functional use for ADLs as evidenced by increasing box/ blocks score by 3 blocks with [name]/LUE Baseline: R: 44 and L: 41 (however hitting barrier multiple times on R) Initial  4 Pt will demonstrate improved alternating attention by completing  table top scanning activity with Supervision Baseline:  Initial  5 Pt will demonstrate understanding of memory compensations and ways to keep thinking skills sharp. Baseline: alarm goes off for his med and he snoozes or stops it and doesn't take his med. Initial    ASSESSMENT:  CLINICAL IMPRESSION: Patient is a 71 y.o. male who was seen today for occupational therapy evaluation for impairments  and overall "slowing" of mobility and cognition impacting pt ability to engage in ADLs at [facility]. Pt currently lives with spouse in a 3 story home with ramped entrance as well as entrance with steps. Pt will benefit from skilled occupational therapy services to address strength and coordination, ROM, pain management, balance, GM/FM control, cognition, safety awareness, introduction of compensatory strategies/AE prn, and implementation of an HEP to improve participation and safety during ADLs and quality of life/leisure pursuits. Pt to begin with focus on PT and SLP as he reports need to prioritize those therapies first due to most impairments and feeling that all 3 at the same time would be too much.  OT will be phased in as PT is phased out or pt feels that he can take on more time in therapy sessions.  PERFORMANCE DEFICITS in functional skills including ADLs, IADLs, coordination, dexterity, tone, ROM, strength, pain, flexibility, FMC, GMC, mobility, balance, body mechanics, endurance, decreased knowledge of precautions, decreased knowledge of use of DME, and UE functional use, cognitive skills including attention, memory, problem solving, and safety awareness, and psychosocial skills including environmental adaptation.   IMPAIRMENTS are limiting patient from ADLs, IADLs, leisure, and social participation.   COMORBIDITIES may have co-morbidities  that affects occupational performance. Patient will benefit from skilled OT to address above impairments and improve overall function.  MODIFICATION OR ASSISTANCE TO COMPLETE EVALUATION: No modification of tasks or assist necessary to complete an evaluation.  OT OCCUPATIONAL PROFILE AND HISTORY: Detailed assessment: Review of records and additional review of physical, cognitive, psychosocial history related to current functional performance.  CLINICAL DECISION MAKING: LOW - limited treatment options, no task modification necessary  REHAB POTENTIAL:  Good  EVALUATION COMPLEXITY: Low    PLAN: OT FREQUENCY: 2x/week  OT DURATION: 8 weeks  PLANNED INTERVENTIONS: self care/ADL training, therapeutic exercise, therapeutic activity, neuromuscular re-education, balance training, functional mobility training, ultrasound, moist heat, cryotherapy, patient/family education, cognitive remediation/compensation, psychosocial skills training, energy conservation, coping strategies training, and DME and/or AE instructions  RECOMMENDED OTHER SERVICES: NA  CONSULTED AND AGREED WITH PLAN OF CARE: Patient  PLAN FOR NEXT SESSION: Initiate large amplitude exercises.   Simonne Come, OTR/L 03/04/2022, 2:36 PM

## 2022-03-04 NOTE — Patient Instructions (Addendum)
  GET BACK TO  YOUR LOUD "AHHHHHHHHHH"s and your sentences THIS AFTERNOON! 6 loud "ahhhhhhh" reps twice a day with your everyday sentences immediately following   We will work on breathing, loudness, attention and memory - and some things for word finding  Constant Therapy app for attention and memory- iPhone  Free for 14 days - $30/month afterwards  - code [id number] for 15% off

## 2022-03-04 NOTE — Therapy (Signed)
OUTPATIENT SPEECH LANGUAGE PATHOLOGY PARKINSON'S EVALUATION   Patient Name: [name] [name] MRN: [id number] DOB:December 12, 1950, 71 y.o., male Today's Date: 03/04/2022  PCP: Dr. Deloris [name] REFERRING PROVIDER: Tat, Wells Guiles, DO   End of Session - 03/04/22 1827     Visit Number 1    Number of Visits 17    Date for SLP Re-Evaluation 05/02/22    SLP Start Time 70    SLP Stop Time  7829    SLP Time Calculation (min) 45 min    Activity Tolerance Patient tolerated treatment well             Past Medical History:  Diagnosis Date   Cervical lymphadenopathy    right - followed by ENT   DOE (dyspnea on exertion) 07/15/2018   Dysphagia, neurologic 07/27/2014   Dystonia 1994   diagnosed in [place]   Enlarged lymph nodes 07/28/2007   Gastroesophageal reflux disease 03/07/2020   Localized swelling of right lower extremity 04/12/2019   Low back pain 11/21/2011   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   Mild neurocognitive disorder due to Parkinson's disease 01/10/2022   Non-Hodgkin lymphoma 2007   diffuse- 6 cycles of chemo with R CHOP Rituxan - last dose 10/08   Nonspecific elevation of levels of transaminase or lactic acid dehydrogenase (LDH) 07/06/2008   Parkinson's disease 07/27/2014   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   Past Surgical History:  Procedure Laterality Date   CHOLECYSTECTOMY, LAPAROSCOPIC     EYE SURGERY     x2 as child   PORT-A-CATH REMOVAL     PORTACATH PLACEMENT     SPLENECTOMY     TONSILLECTOMY     Patient Active Problem List   Diagnosis Date Noted   Mild neurocognitive disorder due to Parkinson's disease 01/10/2022   Gastroesophageal reflux disease 03/07/2020   Localized swelling of right lower extremity 04/12/2019   DOE (dyspnea on exertion) 07/15/2018   10 year risk of MI or stroke 7.5% or greater 04/12/2018   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   Dysphagia,  neurologic 07/27/2014   Parkinson's disease ([place]) 07/27/2014   Dystonia 07/10/2014   Low back pain 11/21/2011   Nonspecific elevation of levels of transaminase or lactic acid dehydrogenase (LDH) 07/06/2008   Enlarged lymph nodes 07/28/2007   Non-Hodgkin lymphoma 03/23/2007    ONSET DATE: Dx in mid-2010's  REFERRING DIAG: G20 - Parkinson's disease   THERAPY DIAG:  Dysarthria and anarthria  Cognitive communication deficit  Aphasia  Rationale for Evaluation and Treatment Rehabilitation  SUBJECTIVE:   SUBJECTIVE STATEMENT: "I know I'm soft." Pt accompanied by: self  PERTINENT HISTORY: [name] [name]") is well known to this SLP - previous ST course d/c'd in January 2023 due to family needs. Pt had partially met his STGs and his LTGs at d/c. Therapy note from his last ST visit in January states "In moderate complex conversation 20 minutes [name] [name]'s volume was in upper 60s average. Pt needed to cancel two appointmetns in the next 2 weeks - given his performance today, tardiness today and new these cancels SLP inquired if d/c today would be best for pt and he stated it would be best."  PAIN:  Are you having pain? No  FALLS: Has patient fallen in last 6 months?  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 Please refer to that encounter for the full report and recommendations. Briefly, results suggested isolated impaired performances across complex attention and semantic fluency. Performance variability was also exhibited across all aspects of learning and memory. Regarding etiology, the most likely culprit for  ongoing cognitive weakness remains his past history of Parkinson's disease. While processing speed was improved relative to what might be expected, dysfunction surrounding complex attention and encoding/retrieval deficits of memory are quite common in this illness. The fact that motor/gait dysfunction was noted years prior to concern surrounding cognitive decline is also a typical timeline associated with Parkinson's disease. Mild anxiety and sleep dysfunction could further influence cognitive performances. Research surrounding cognitive side effects of pramipexole is mixed; however, I cannot rule out an ongoing side effect contribution as well."  COGNITION: Overall cognitive status: Impaired: Attention: Impaired: Alternating, Divided and Memory: Impaired: Short term Areas of impairment: Attention and Memory Comments: Pt finds he can no longer answer questions his wife asks while providing assistance with her computer. Also gives example of noticing the inability to return to the primary task when called away to secondary task due to distraction with other tasks. Is compensating divided attention by doing one thing and then the other (e.g., will turn the divided attention task into an alternating attention task)  MOTOR SPEECH: Overall motor speech: impaired Level of impairment: Word Respiration: thoracic breathing, clavicular breathing, and speaking on residual capacity Phonation: low vocal intensity Resonance: WFL Articulation: Impaired: conversation - impaired when pt speaks on residual volume Intelligibility: Intelligibility reduced - 95%+ in conversation mostly due to aphonia Interfering components:  Effective technique: increased vocal intensity  ORAL MOTOR ASSESSMENT:   Impaired: Facial : Symmetry impaired: Impaired left SLP noted that eyelid and cheek musculature with slight droop compared to rt. Labial and lingual coordination both mod-max affected in alternative motion tasks . Lingual  strength impaired mildly, primarily on the left.   OBJECTIVE ASSESSMENT:  Measured when a sound level meter was placed 30 cm away from pt's masked mouth, 10 minutes of simple conversational speech was reduced today, at average upper 50s-low 60s dB (WNL= average 70-72dB) with range of 57-63dB. Overall speech intelligibility for this listener in a quiet environment was affected mostly by aphonia, at approximately 95-100%. Production of loud /a/ averaged low 90s dB (range of 87 to 95dB) and initial rare min cues needed for loudness.   Pt rated effort level at 7/10 for production of loud /a/ (10=maximal effort). In short conversation (2-3 minutes, x2) task pt was asked to use the same amount of effort as with loud /a/. Loudness average with this increased effort was low to mid 60s dB (range of 61 to 66dB) with rare min nonverbal cues for loudness. Pt would benefit from skilled ST in order to improve speech intelligibility and pt's QOL.  Pt does not report difficulty with swallowing warranting further evaluation however SLP to monitor pt and inquire again about this during this therapy course.    PATIENT REPORTED OUTCOME MEASURES (PROM): Communication Effectiveness Survey: Pt scored 21/32 on this survey. Very effective communication (4/4) occurs with a familiar person over the phone and a stranger on the phone. Minimal effectiveness (2/4) occurs with having conversation with family member at home , having a conversation in a car, and conversation with someone at at a distance. Not effective communication (  1/4) occurs with being a part of a noisy conversation with background noise.   TODAY'S TREATMENT:  SLP discussed the rationale for BID completion of loud /a/ and everyday sentences. Told pt to complete them this afternoon. SLP also addressed pt's treatment plan, probable goals, and introduced pt to the Constant Therapy app, providing instructions for download to pt's phone and/or an iPad and provided a  discount code ([email or web address])   PATIENT EDUCATION: Education details: see above in "today's treatment" Person educated: Patient Education method: Explanation and Handouts Education comprehension: verbalized understanding and downloaded app   HOME EXERCISE PROGRAM: Constant Terhapy suggested, and loud /a/ x6/BID, and everyday sentences.     GOALS: Goals reviewed with patient? Yes  SHORT TERM GOALS: Target date: 04/01/2022    pt will produce loud /a/ with at least low 90s dB average over three sessions Baseline: Goal status: INITIAL  2.  Pt will produce 16/20 sentence responses with WNL volume over two sessions Baseline:  Goal status: INITIAL  3.  Pt will produce 5 minutes simple conversation with volume upper 60s dB average over three sessions Baseline:  Goal status: INITIAL  4.  pt will generate abdominal breathing 80% of the time when engaging in 5 minutes simple conversation in 2 sessions Baseline:  Goal status: INITIAL  5.  Pt will indicate 100% successful medication administration for 7 days using strategies Baseline:  Goal status: INITIAL    LONG TERM GOALS: Target date: 05/02/2022    Pt will score higher than 21/32 on CES in the final two ST sessions Baseline:  Goal status: INITIAL  2.  Pt will demo successful usage of anomia compensations PRN in 10 minutes mod complex conversation in 2 sessions Baseline:  Goal status: INITIAL  3.  Pt will maintain average of upper 60s dB over 10 minute simple-mod complex conversation with rare min A in three sessions  Baseline:  Goal status: INITIAL  4.  pt will use abdominal breathing 75% of the time in 10 minutes simple-mod complex conversation in three sessions  Baseline:  Goal status: INITIAL  5.  Pt will indicate 100% successful medication administration after 04/08/21 for 14 days, using strategies  Baseline:  Goal status: INITIAL   ASSESSMENT:  CLINICAL IMPRESSION: Patient is a 70 y.o. male who was seen today  for assessment of speech clarity who demonstrated mod dysarthria and reduced breath support due to Parkinson's disease. When asked to produce speech with same effort as loud /a/ pt improved volume in conversation from upper 50s-low 60s dB to low to mid 60s dB. Pt would benefit from skilled ST targeting loudness in conversation, anomia strategies, and developing simple memory strategy for med administration due to occasional accidental skipping of meds due to swiping or stopping alarms without taking medication. Pt's OT will target attention.   OBJECTIVE IMPAIRMENTS  Objective impairments include memory, aphasia, and dysarthria. These impairments are limiting patient from managing medications, household responsibilities, and effectively communicating at home and in community.Factors affecting potential to achieve goals and functional outcome are previous level of function and severity of impairments.. Patient will benefit from skilled SLP services to address above impairments and improve overall function.  REHAB POTENTIAL: Good  PLAN: SLP FREQUENCY: 2x/week  SLP DURATION: 8 weeks  PLANNED INTERVENTIONS: Language facilitation, Environmental controls, Cueing hierachy, Internal/external aids, Functional tasks, Multimodal communication approach, SLP instruction and feedback, and Compensatory strategies    Barney Russomanno, CCC-SLP 03/04/2022, 6:28 PM

## 2022-03-04 NOTE — Therapy (Signed)
OUTPATIENT PHYSICAL THERAPY NEURO EVALUATION   Patient Name: [name] [name] MRN: [id number] DOB:1951/03/19, 71 y.o., male Today's Date: 03/05/2022   PCP: [name], DO REFERRING PROVIDER: Tat, Eustace Quail, DO   PT End of Session - 03/05/22 1529     Visit Number 1    Number of Visits 9    Date for PT Re-Evaluation 04/04/22    Authorization Type Medicare/BCBS    PT Start Time 1112    PT Stop Time 1145    PT Time Calculation (min) 33 min    Activity Tolerance Patient tolerated treatment well    Behavior During Therapy [facility] for tasks assessed/performed             Past Medical History:  Diagnosis Date   Cervical lymphadenopathy    right - followed by ENT   DOE (dyspnea on exertion) 07/15/2018   Dysphagia, neurologic 07/27/2014   Dystonia 1994   diagnosed in [place]   Enlarged lymph nodes 07/28/2007   Gastroesophageal reflux disease 03/07/2020   Localized swelling of right lower extremity 04/12/2019   Low back pain 11/21/2011   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   Mild neurocognitive disorder due to Parkinson's disease 01/10/2022   Non-Hodgkin lymphoma 2007   diffuse- 6 cycles of chemo with R CHOP Rituxan - last dose 10/08   Nonspecific elevation of levels of transaminase or lactic acid dehydrogenase (LDH) 07/06/2008   Parkinson's disease 07/27/2014   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   Past Surgical History:  Procedure Laterality Date   CHOLECYSTECTOMY, LAPAROSCOPIC     EYE SURGERY     x2 as child   PORT-A-CATH REMOVAL     PORTACATH PLACEMENT     SPLENECTOMY     TONSILLECTOMY     Patient Active Problem List   Diagnosis Date Noted   Mild neurocognitive disorder due to Parkinson's disease 01/10/2022   Gastroesophageal reflux disease 03/07/2020   Localized swelling of right lower extremity 04/12/2019   DOE (dyspnea on exertion) 07/15/2018   10 year risk of MI or stroke 7.5% or greater 04/12/2018   Post-splenectomy  04/02/2015   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   Dysphagia, neurologic 07/27/2014   Parkinson's disease ([place]) 07/27/2014   Dystonia 07/10/2014   Low back pain 11/21/2011   Nonspecific elevation of levels of transaminase or lactic acid dehydrogenase (LDH) 07/06/2008   Enlarged lymph nodes 07/28/2007   Non-Hodgkin lymphoma 03/23/2007    ONSET DATE: 01/30/2022 (MD referral)  REFERRING DIAG: G20 (ICD-10-CM) - Parkinson's disease ([place])  THERAPY DIAG:  Other abnormalities of gait and mobility  Unsteadiness on feet  Abnormal posture  Other symptoms and signs involving the nervous system  Rationale for Evaluation and Treatment Rehabilitation  SUBJECTIVE:  SUBJECTIVE STATEMENT: Wife was sick and then I was sick.  Physically, I've been okay.  Just feel afraid that I couldn't keep up with the class.  Feels he is slumping more.  Feet are sticking worse to the floor.  Haven't fallen, but nearly have-feet stick when it's time to move.  If I stop to think about the strategies we worked on before, I'm okay. Pt accompanied by: self  PERTINENT HISTORY: Recent neurocognitive testing; MCI; cervical lymphadenopathy, dystonia, GERD, Meige syndrome, non-Hodgkin Lymphoma  PAIN:  Are you having pain? No  PRECAUTIONS: Fall  FALLS: Has patient fallen in last 6 months? No  LIVING ENVIRONMENT: Lives with: lives with their family and lives with their spouse Lives in: House/apartment Stairs: Yes: Internal: 12 steps; on right going up and External: 2 steps; none Has following equipment at home: None that patient uses  PLOF: Independent  PATIENT GOALS   Pt's goals for therapy are to do whatever is necessary to keep going.  OBJECTIVE:   COGNITION: Overall cognitive status:   Mild Cognitive Impairment-dx 12/2021   POSTURE: rounded shoulders, forward head, and flexed trunk   LOWER EXTREMITY ROM:   WFL for BLEs   LOWER EXTREMITY MMT:    MMT Right Eval Left Eval  Hip flexion 4/5 4/5  Hip extension    Hip abduction    Hip adduction    Hip internal rotation    Hip external rotation    Knee flexion 4/5 4+/5  Knee extension 4/5 4/5  Ankle dorsiflexion 4/5 4/5  Ankle plantarflexion    Ankle inversion    Ankle eversion    (Blank rows = not tested)   TRANSFERS: Assistive device utilized: None  Sit to stand: Complete Independence Stand to sit: Complete Independence   GAIT: Gait pattern: step through pattern, decreased step length- Right, decreased step length- Left, and trunk flexed Distance walked: 60 ft x 4 reps Assistive device utilized: None Level of assistance: Modified independence Comments: Pt does report occasional freezing episodes with gait, going up on toes upon initiating gait; not noted in eval today.  FUNCTIONAL TESTs:  5 times sit to stand: 8.29 sec with slight retropulsion.  Reports difficulty getting up from the sofa. Timed up and go (TUG): 8.65 sec TUG cognitive:10.69 sec TUG manual:  8.68 sec 59M gait velocity:  9.10 sec (3.6 ft/sec) MiniBESTest:  24/28 (see flow sheet for details)  [facility] PT Assessment - 03/05/22 0001       Standardized Balance Assessment   Standardized Balance Assessment Mini-BESTest      Mini-BESTest   Sit To Stand Normal: Comes to stand without use of hands and stabilizes independently.    Rise to Toes Normal: Stable for 3 s with maximum height.    Stand on one leg (left) Moderate: < 20 s   6.04, 4.6   Stand on one leg (right) Moderate: < 20 s   1.78, 2.19   Stand on one leg - lowest score 1    Compensatory Stepping Correction - Forward Normal: Recovers independently with a single, large step (second realignement is allowed).    Compensatory Stepping Correction - Backward Normal: Recovers independently  with a single, large step    Compensatory Stepping Correction - Left Lateral Normal: Recovers independently with 1 step (crossover or lateral OK)    Compensatory Stepping Correction - Right Lateral Normal: Recovers independently with 1 step (crossover or lateral OK)    Stepping Corredtion Lateral - lowest score 2    Stance -  Feet together, eyes open, firm surface  Normal: 30s    Stance - Feet together, eyes closed, foam surface  Moderate: < 30s    Incline - Eyes Closed Normal: Stands independently 30s and aligns with gravity    Change in Gait Speed Normal: Significantly changes walkling speed without imbalance    Walk with head turns - Horizontal Moderate: performs head turns with reduction in gait speed.    Walk with pivot turns Normal: Turns with feet close FAST (< 3 steps) with good balance.    Step over obstacles Normal: Able to step over box with minimal change of gait speed and with good balance.    Timed UP & GO with Dual Task Moderate: Dual Task affects either counting OR walking (>10%) when compared to the TUG without Dual Task.    Mini-BEST total score 24                PATIENT EDUCATION: Education details: PT eval results, POC Person educated: Patient Education method: Explanation Education comprehension: verbalized understanding   HOME EXERCISE PROGRAM: REcommendation to continue previous exercises from previous bout of therapy: MedBridge MPPTGELP and standing PWR! Moves    GOALS: Goals reviewed with patient? Yes  SHORT TERM GOALS: = LTGs  LONG TERM GOALS: Target date: 04/04/2022  Pt will be independent with HEP for improved strength, balance, transfers, and gait. Baseline:  Goal status: INITIAL  2.  Pt will perform 8 of 10 reps of sit<>stand from <18" surface, without retropulsion or LOB, to demonstrate improved functional strength and transfer efficiency. Baseline:  Goal status: INITIAL  3.  Pt will verbalize tips to reduce freezing/festination with gait  and turns.  Baseline:  Goal status: INITIAL  4.  Pt will verbalize plans for continued community fitness to maximize gains made in PT. Baseline:  Goal status: INITIAL  ASSESSMENT:  CLINICAL IMPRESSION: Patient is a 71 y.o. male who was seen today for physical therapy evaluation and treatment for Parkinson's disease.   He is known to this therapist from previous bout of therapy ending January 2023, and he returns for 6 month follow up eval.  In this interim time, both patient and wife have experienced illness, and pt endorses not consistently performing HEP.  He demonstrates slightly slowed gait velocity and slightly decreased balance score on MiniBESTest, but he remains at a lower fall risk per these measures.  He describes difficulty with low surface transfers and decreased confidence with balance as well as increased episodes of freezing of gait with initiation of gait.  He would benefit from short course of therapy to address tips, strategies, exercises to address the stated deficits to decrease fall risk and to improve overall functional mobility.   OBJECTIVE IMPAIRMENTS Abnormal gait, decreased balance, decreased mobility, difficulty walking, decreased strength, impaired flexibility, and postural dysfunction.   ACTIVITY LIMITATIONS standing, transfers, and locomotion level  PARTICIPATION LIMITATIONS: community activity and participation in PD fitness class  PERSONAL FACTORS 3+ comorbidities:    Recent neurocognitive testing; MCI; cervical lymphadenopathy, dystonia, GERD, Meige syndrome, non-Hodgkin Lymphoma are also affecting patient's functional outcome.   REHAB POTENTIAL: Good  CLINICAL DECISION MAKING: Evolving/moderate complexity  EVALUATION COMPLEXITY: Moderate  PLAN: PT FREQUENCY: 2x/week  PT DURATION: 4 weeks plus eval week  PLANNED INTERVENTIONS: Therapeutic exercises, Therapeutic activity, Neuromuscular re-education, Balance training, Gait training, Patient/Family  education, Joint mobilization, and Manual therapy  PLAN FOR NEXT SESSION: Thorough review of previous HEP and add/update as needed.  Perform MCTSIB to assess stationary balance.  Dashauna Heymann W., PT 03/05/2022, 5:22 PM

## 2022-03-10 ENCOUNTER — Other Ambulatory Visit: Payer: Self-pay | Admitting: Neurology

## 2022-03-12 ENCOUNTER — Ambulatory Visit: Payer: Medicare Other | Admitting: Speech Pathology

## 2022-03-12 ENCOUNTER — Encounter: Payer: Self-pay | Admitting: Speech Pathology

## 2022-03-12 DIAGNOSIS — R29818 Other symptoms and signs involving the nervous system: Secondary | ICD-10-CM | POA: Diagnosis not present

## 2022-03-12 DIAGNOSIS — R2689 Other abnormalities of gait and mobility: Secondary | ICD-10-CM | POA: Diagnosis not present

## 2022-03-12 DIAGNOSIS — R41841 Cognitive communication deficit: Secondary | ICD-10-CM | POA: Diagnosis not present

## 2022-03-12 DIAGNOSIS — R2681 Unsteadiness on feet: Secondary | ICD-10-CM | POA: Diagnosis not present

## 2022-03-12 DIAGNOSIS — R293 Abnormal posture: Secondary | ICD-10-CM | POA: Diagnosis not present

## 2022-03-12 DIAGNOSIS — R471 Dysarthria and anarthria: Secondary | ICD-10-CM | POA: Diagnosis not present

## 2022-03-12 NOTE — Therapy (Signed)
OUTPATIENT SPEECH LANGUAGE PATHOLOGY TREATMENT NOTE   Patient Name: [name] [name] MRN: [id number] DOB:1950/11/06, 71 y.o., male Today's Date: 03/12/2022  PCP: [name], DO REFERRING PROVIDER: Loralie Champagne, DO  END OF SESSION:   End of Session - 03/12/22 1321     Visit Number 2    Number of Visits 17    Date for SLP Re-Evaluation 05/02/22    SLP Start Time 45    SLP Stop Time  1356    SLP Time Calculation (min) 38 min    Activity Tolerance Patient tolerated treatment well             Past Medical History:  Diagnosis Date   Cervical lymphadenopathy    right - followed by ENT   DOE (dyspnea on exertion) 07/15/2018   Dysphagia, neurologic 07/27/2014   Dystonia 1994   diagnosed in [place]   Enlarged lymph nodes 07/28/2007   Gastroesophageal reflux disease 03/07/2020   Localized swelling of right lower extremity 04/12/2019   Low back pain 11/21/2011   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   Mild neurocognitive disorder due to Parkinson's disease 01/10/2022   Non-Hodgkin lymphoma 2007   diffuse- 6 cycles of chemo with R CHOP Rituxan - last dose 10/08   Nonspecific elevation of levels of transaminase or lactic acid dehydrogenase (LDH) 07/06/2008   Parkinson's disease 07/27/2014   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   Past Surgical History:  Procedure Laterality Date   CHOLECYSTECTOMY, LAPAROSCOPIC     EYE SURGERY     x2 as child   PORT-A-CATH REMOVAL     PORTACATH PLACEMENT     SPLENECTOMY     TONSILLECTOMY     Patient Active Problem List   Diagnosis Date Noted   Mild neurocognitive disorder due to Parkinson's disease 01/10/2022   Gastroesophageal reflux disease 03/07/2020   Localized swelling of right lower extremity 04/12/2019   DOE (dyspnea on exertion) 07/15/2018   10 year risk of MI or stroke 7.5% or greater 04/12/2018   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   Meige syndrome (blepharospasm with oromandibular dystonia)  07/27/2014   Dysphagia, neurologic 07/27/2014   Parkinson's disease ([place]) 07/27/2014   Dystonia 07/10/2014   Low back pain 11/21/2011   Nonspecific elevation of levels of transaminase or lactic acid dehydrogenase (LDH) 07/06/2008   Enlarged lymph nodes 07/28/2007   Non-Hodgkin lymphoma 03/23/2007    ONSET DATE: Dx in mid-2010s  REFERRING DIAG: G20 - Parkinson's disease   THERAPY DIAG:  Dysarthria and anarthria  Rationale for Evaluation and Treatment Rehabilitation  SUBJECTIVE: "Doing good."  PAIN:  Are you having pain? No    OBJECTIVE:   TODAY'S TREATMENT:  03/12/22: SLP targeted increased vocal intensity this session.    Pt reports that on average he completes 6 sustained /a/.   Sustained /a/ (dB): 88, 86, 92, 95, 96, 92, 93, 95, 93, 89 dB   Pitch glides: x10   Functional sentences: 2 sets of 10 - First set (over the fence voice): WFL dB  -Second set (authoritative): ~65 dB; increased with verbal and visual cueing     PATIENT EDUCATION: Education details: see above in "today's treatment" Person educated: Patient Education method: Explanation and Handouts Education comprehension: verbalized understanding and downloaded app     HOME EXERCISE PROGRAM: Constant Therapy suggested, and loud /a/ x6/BID, and everyday sentences.         GOALS: Goals reviewed with patient? Yes   SHORT TERM GOALS: Target date: 04/01/2022     pt will  produce loud /a/ with at least low 90s dB average over three sessions Baseline: Goal status: INITIAL   2.  Pt will produce 16/20 sentence responses with WNL volume over two sessions Baseline:  Goal status: INITIAL   3.  Pt will produce 5 minutes simple conversation with volume upper 60s dB average over three sessions Baseline:  Goal status: INITIAL   4.  pt will generate abdominal breathing 80% of the time when engaging in 5 minutes simple conversation in 2 sessions Baseline:  Goal status: INITIAL   5.  Pt will indicate  100% successful medication administration for 7 days using strategies Baseline:  Goal status: INITIAL       LONG TERM GOALS: Target date: 05/02/2022     Pt will score higher than 21/32 on CES in the final two ST sessions Baseline:  Goal status: INITIAL   2.  Pt will demo successful usage of anomia compensations PRN in 10 minutes mod complex conversation in 2 sessions Baseline:  Goal status: INITIAL   3.  Pt will maintain average of upper 60s dB over 10 minute simple-mod complex conversation with rare min A in three sessions  Baseline:  Goal status: INITIAL   4.  pt will use abdominal breathing 75% of the time in 10 minutes simple-mod complex conversation in three sessions  Baseline:  Goal status: INITIAL   5.  Pt will indicate 100% successful medication administration after 04/08/21 for 14 days, using strategies  Baseline:  Goal status: INITIAL     ASSESSMENT:   CLINICAL IMPRESSION:   Patient is a 71 y.o. male who was seen today for assessment of speech clarity who demonstrated mod dysarthria and reduced breath support due to Parkinson's disease. See tx note. Cont with current POC. Pt would benefit from skilled ST targeting loudness in conversation, anomia strategies, and developing simple memory strategy for med administration due to occasional accidental skipping of meds due to swiping or stopping alarms without taking medication. Pt's OT will target attention.    OBJECTIVE IMPAIRMENTS  Objective impairments include memory, aphasia, and dysarthria. These impairments are limiting patient from managing medications, household responsibilities, and effectively communicating at home and in community.Factors affecting potential to achieve goals and functional outcome are previous level of function and severity of impairments.. Patient will benefit from skilled SLP services to address above impairments and improve overall function.   REHAB POTENTIAL: Good   PLAN: SLP FREQUENCY:  2x/week   SLP DURATION: 8 weeks   PLANNED INTERVENTIONS: Language facilitation, Environmental controls, Cueing hierachy, Internal/external aids, Functional tasks, Multimodal communication approach, SLP instruction and feedback, and Compensatory strategies   [employer], CCC-SLP 03/12/2022, 1:22 PM

## 2022-03-13 ENCOUNTER — Ambulatory Visit: Payer: Medicare Other | Admitting: Physical Therapy

## 2022-03-13 DIAGNOSIS — R2681 Unsteadiness on feet: Secondary | ICD-10-CM

## 2022-03-13 DIAGNOSIS — R2689 Other abnormalities of gait and mobility: Secondary | ICD-10-CM

## 2022-03-13 DIAGNOSIS — R41841 Cognitive communication deficit: Secondary | ICD-10-CM | POA: Diagnosis not present

## 2022-03-13 DIAGNOSIS — R29818 Other symptoms and signs involving the nervous system: Secondary | ICD-10-CM | POA: Diagnosis not present

## 2022-03-13 DIAGNOSIS — R293 Abnormal posture: Secondary | ICD-10-CM | POA: Diagnosis not present

## 2022-03-13 DIAGNOSIS — R471 Dysarthria and anarthria: Secondary | ICD-10-CM | POA: Diagnosis not present

## 2022-03-13 NOTE — Therapy (Incomplete)
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   Mild neurocognitive disorder due to Parkinson's disease 01/10/2022   Non-Hodgkin lymphoma 2007   diffuse- 6 cycles of chemo with R CHOP Rituxan - last dose 10/08   Nonspecific elevation of levels of transaminase or lactic acid dehydrogenase (LDH) 07/06/2008   Parkinson's disease 07/27/2014   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   Past Surgical History:  Procedure Laterality Date   CHOLECYSTECTOMY, LAPAROSCOPIC     EYE SURGERY     x2 as child   PORT-A-CATH REMOVAL     PORTACATH PLACEMENT     SPLENECTOMY     TONSILLECTOMY     Patient Active Problem List   Diagnosis Date Noted   Mild neurocognitive disorder due to Parkinson's disease 01/10/2022   Gastroesophageal reflux disease 03/07/2020   Localized swelling of right lower extremity 04/12/2019   DOE (dyspnea on exertion) 07/15/2018   10 year risk of MI or stroke 7.5% or greater 04/12/2018   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  Rationale for Evaluation and Treatment Rehabilitation  PERTINENT HISTORY: ***  PRECAUTIONS: ***  SUBJECTIVE: Nothing new.  PAIN:  Are you having pain? Yes: NPRS scale: 2/10 Pain location: back Pain description: soreness, achiness Aggravating factors: slept wrong Relieving factors: movement improves   OBJECTIVE:   Pt performs PWR! Moves in seated position x 10 reps   PWR! Up for improved posture  PWR! Rock for improved weighshifting  PWR! Twist for improved trunk rotation   PWR! Step for improved step initiation   Cues provided for technique and intensity of movement    Pt performs PWR! Moves in standing position x 10 reps   PWR! Up for improved posture  PWR! Rock for improved weighshifting  PWR! Twist for improved trunk rotation   PWR! Step for improved step initiation   Cues provided for  technique, intensity of movement  Pt rates  effort level as 4/10  (objective measures completed at initial evaluation unless otherwise dated)  COGNITION: Overall cognitive status:  Mild Cognitive Impairment-dx 12/2021             POSTURE: rounded shoulders, forward head, and flexed trunk    LOWER EXTREMITY ROM:   WFL for BLEs     LOWER EXTREMITY MMT:     MMT Right Eval Left Eval  Hip flexion 4/5 4/5  Hip extension      Hip abduction      Hip adduction      Hip internal rotation  Hip external rotation      Knee flexion 4/5 4+/5  Knee extension 4/5 4/5  Ankle dorsiflexion 4/5 4/5  Ankle plantarflexion      Ankle inversion      Ankle eversion      (Blank rows = not tested)     TRANSFERS: Assistive device utilized: None  Sit to stand: Complete Independence Stand to sit: Complete Independence     GAIT: Gait pattern: step through pattern, decreased step length- Right, decreased step length- Left, and trunk flexed Distance walked: 60 ft x 4  reps Assistive device utilized: None Level of assistance: Modified independence Comments: Pt does report occasional freezing episodes with gait, going up on toes upon initiating gait; not noted in eval today.   FUNCTIONAL TESTs:  5 times sit to stand: 8.29 sec with slight retropulsion.  Reports difficulty getting up from the sofa. Timed up and go (TUG): 8.65 sec TUG cognitive:10.69 sec TUG manual:  8.68 sec 36M gait velocity:  9.10 sec (3.6 ft/sec) MiniBESTest:  24/28 (see flow sheet for details)   [facility] PT Assessment - 03/05/22 0001                Standardized Balance Assessment    Standardized Balance Assessment Mini-BESTest          Mini-BESTest    Sit To Stand Normal: Comes to stand without use of hands and stabilizes independently.     Rise to Toes Normal: Stable for 3 s with maximum height.     Stand on one leg (left) Moderate: < 20 s   6.04, 4.6    Stand on one leg (right) Moderate: < 20 s   1.78, 2.19    Stand on one leg - lowest score 1     Compensatory Stepping Correction - Forward Normal: Recovers independently with a single, large step (second realignement is allowed).     Compensatory Stepping Correction - Backward Normal: Recovers independently with a single, large step     Compensatory Stepping Correction - Left Lateral Normal: Recovers independently with 1 step (crossover or lateral OK)     Compensatory Stepping Correction - Right Lateral Normal: Recovers independently with 1 step (crossover or lateral OK)     Stepping Corredtion Lateral - lowest score 2     Stance - Feet together, eyes open, firm surface  Normal: 30s     Stance - Feet together, eyes closed, foam surface  Moderate: < 30s     Incline - Eyes Closed Normal: Stands independently 30s and aligns with gravity     Change in Gait Speed Normal: Significantly changes walkling speed without imbalance     Walk with head turns - Horizontal Moderate: performs head turns with reduction in gait speed.     Walk with  pivot turns Normal: Turns with feet close FAST (< 3 steps) with good balance.     Step over obstacles Normal: Able to step over box with minimal change of gait speed and with good balance.     Timed UP & GO with Dual Task Moderate: Dual Task affects either counting OR walking (>10%) when compared to the TUG without Dual Task.     Mini-BEST total score 24                         PATIENT EDUCATION: Education details: PT eval results, POC Person educated: Patient Education method: Explanation Education comprehension: verbalized understanding     HOME EXERCISE PROGRAM:  REcommendation to continue previous exercises from previous bout of therapy: MedBridge MPPTGELP and standing PWR! Moves       GOALS: Goals reviewed with patient? Yes   SHORT TERM GOALS: = LTGs   LONG TERM GOALS: Target date: 04/04/2022   Pt will be independent with HEP for improved strength, balance, transfers, and gait. Baseline:  Goal status: INITIAL   2.  Pt will perform 8 of 10 reps of sit<>stand from <18" surface, without retropulsion or LOB, to demonstrate improved functional strength and transfer efficiency. Baseline:  Goal status: INITIAL   3.  Pt will verbalize tips to reduce freezing/festination with gait and turns.  Baseline:  Goal status: INITIAL   4.  Pt will verbalize plans for continued community fitness to maximize gains made in PT. Baseline:  Goal status: INITIAL   ASSESSMENT:   CLINICAL IMPRESSION: Patient is a 71 y.o. male who was seen today for physical therapy evaluation and treatment for Parkinson's disease.   He is known to this therapist from previous bout of therapy ending January 2023, and he returns for 6 month follow up eval.  In this interim time, both patient and wife have experienced illness, and pt endorses not consistently performing HEP.  He demonstrates slightly slowed gait velocity and slightly decreased balance score on MiniBESTest, but he remains at a lower fall risk  per these measures.  He describes difficulty with low surface transfers and decreased confidence with balance as well as increased episodes of freezing of gait with initiation of gait.  He would benefit from short course of therapy to address tips, strategies, exercises to address the stated deficits to decrease fall risk and to improve overall functional mobility.     OBJECTIVE IMPAIRMENTS Abnormal gait, decreased balance, decreased mobility, difficulty walking, decreased strength, impaired flexibility, and postural dysfunction.    ACTIVITY LIMITATIONS standing, transfers, and locomotion level   PARTICIPATION LIMITATIONS: community activity and participation in PD fitness class   PERSONAL FACTORS 3+ comorbidities:    Recent neurocognitive testing; MCI; cervical lymphadenopathy, dystonia, GERD, Meige syndrome, non-Hodgkin Lymphoma are also affecting patient's functional outcome.    REHAB POTENTIAL: Good   CLINICAL DECISION MAKING: Evolving/moderate complexity   EVALUATION COMPLEXITY: Moderate   PLAN: PT FREQUENCY: 2x/week   PT DURATION: 4 weeks plus eval week   PLANNED INTERVENTIONS: Therapeutic exercises, Therapeutic activity, Neuromuscular re-education, Balance training, Gait training, Patient/Family education, Joint mobilization, and Manual therapy   PLAN FOR NEXT SESSION: Thorough review of previous HEP and add/update as needed.  Perform MCTSIB to assess stationary balance.       Davionne Mastrangelo W., PT 03/13/2022, 1:58 PM

## 2022-03-13 NOTE — Patient Instructions (Signed)
Sit to Stand Transfers:  Scoot out to the edge of the chair Place your feet flat on the floor, shoulder width apart.  Make sure your feet are tucked just under your knees. Lean forward (nose over toes) with momentum, and stand up tall with your best posture.  If you need to use your arms, use them as a quick boost up to stand. If you are in a low or soft chair, you can lean back and then forward up to stand, in order to get more momentum. Once you are standing, make sure you are looking ahead and standing tall.  To sit down:  Back up until you feel the chair behind your legs. Bend at your hips, reaching  Back for you chair, if needed, then slowly squat to sit down on your chair.  

## 2022-03-14 ENCOUNTER — Encounter: Payer: Self-pay | Admitting: Physical Therapy

## 2022-03-18 ENCOUNTER — Encounter: Payer: Self-pay | Admitting: Physical Therapy

## 2022-03-18 ENCOUNTER — Ambulatory Visit: Payer: Medicare Other

## 2022-03-18 ENCOUNTER — Ambulatory Visit: Payer: Medicare Other | Admitting: Physical Therapy

## 2022-03-18 DIAGNOSIS — R471 Dysarthria and anarthria: Secondary | ICD-10-CM | POA: Diagnosis not present

## 2022-03-18 DIAGNOSIS — R4701 Aphasia: Secondary | ICD-10-CM

## 2022-03-18 DIAGNOSIS — R2681 Unsteadiness on feet: Secondary | ICD-10-CM

## 2022-03-18 DIAGNOSIS — R41841 Cognitive communication deficit: Secondary | ICD-10-CM

## 2022-03-18 DIAGNOSIS — R29818 Other symptoms and signs involving the nervous system: Secondary | ICD-10-CM | POA: Diagnosis not present

## 2022-03-18 DIAGNOSIS — R293 Abnormal posture: Secondary | ICD-10-CM

## 2022-03-18 DIAGNOSIS — R2689 Other abnormalities of gait and mobility: Secondary | ICD-10-CM | POA: Diagnosis not present

## 2022-03-18 NOTE — Therapy (Signed)
OUTPATIENT SPEECH LANGUAGE PATHOLOGY TREATMENT NOTE   Patient Name: [name] [name] MRN: [id number] DOB:[birth year], 71 y.o., male Today's Date: 03/18/2022  PCP: [name], DO REFERRING PROVIDER: Loralie Champagne, DO  END OF SESSION:   End of Session - 03/18/22 1414     Visit Number 3    Number of Visits 17    Date for SLP Re-Evaluation 05/02/22    SLP Start Time 79    SLP Stop Time  1446    SLP Time Calculation (min) 36 min    Activity Tolerance Patient tolerated treatment well             Past Medical History:  Diagnosis Date   Cervical lymphadenopathy    right - followed by ENT   DOE (dyspnea on exertion) 07/15/2018   Dysphagia, neurologic 07/27/2014   Dystonia 1994   diagnosed in [place]   Enlarged lymph nodes 07/28/2007   Gastroesophageal reflux disease 03/07/2020   Localized swelling of right lower extremity 04/12/2019   Low back pain 11/21/2011   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   Mild neurocognitive disorder due to Parkinson's disease 01/10/2022   Non-Hodgkin lymphoma 2007   diffuse- 6 cycles of chemo with R CHOP Rituxan - last dose 10/08   Nonspecific elevation of levels of transaminase or lactic acid dehydrogenase (LDH) 07/06/2008   Parkinson's disease 07/27/2014   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   Past Surgical History:  Procedure Laterality Date   CHOLECYSTECTOMY, LAPAROSCOPIC     EYE SURGERY     x2 as child   PORT-A-CATH REMOVAL     PORTACATH PLACEMENT     SPLENECTOMY     TONSILLECTOMY     Patient Active Problem List   Diagnosis Date Noted   Mild neurocognitive disorder due to Parkinson's disease 01/10/2022   Gastroesophageal reflux disease 03/07/2020   Localized swelling of right lower extremity 04/12/2019   DOE (dyspnea on exertion) 07/15/2018   10 year risk of MI or stroke 7.5% or greater 04/12/2018   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   Meige syndrome (blepharospasm with oromandibular dystonia)  07/27/2014   Dysphagia, neurologic 07/27/2014   Parkinson's disease ([place]) 07/27/2014   Dystonia 07/10/2014   Low back pain 11/21/2011   Nonspecific elevation of levels of transaminase or lactic acid dehydrogenase (LDH) 07/06/2008   Enlarged lymph nodes 07/28/2007   Non-Hodgkin lymphoma 03/23/2007    ONSET DATE: Dx in mid-2010s  REFERRING DIAG: G20 - Parkinson's disease   THERAPY DIAG:  Dysarthria and anarthria  Cognitive communication deficit  Aphasia  Rationale for Evaluation and Treatment Rehabilitation  SUBJECTIVE: "I made some good calls this morning."  PAIN:  Are you having pain? No   OBJECTIVE:   TODAY'S TREATMENT: 03/18/22: [name] agreed with STGs and LTGs when discussed with him. SLP and pt talked about how pt could make 7 days consecutive with WNL medication administration. He set second alarms on his phone for 5 minutes after alarms set on his watch, in order to hear one of the two and then administer his own meds. Regarding "s" statement, [name] made 3 phone calls this morning without anyone asking him to repeat. Louder speech was targeted this session with loud /a/ at average in low 90s, everyday sentences with average upper 70s low 80s dB. Pt and SLP talked for approx 5 minutes about the need to complete loud /a/ and sentences BID and not QD. SLP targeted abdominal breathing (AB) with pt and he req'd min cues initially however was independent after approx  60 seconds, at rest.   03/12/22: SLP targeted increased vocal intensity this session. Pt reports that on average he completes 6 sustained /a/. Sustained /a/ (dB): 88, 86, 92, 95, 96, 92, 93, 95, 93, 89 dB. Pitch glides: x10. Functional sentences: 2 sets of 10 - First set (over the fence voice): WFL dB  -Second set (authoritative): ~65 dB; increased with verbal and visual cueing  PATIENT EDUCATION: Education details: see above in "today's treatment" Person educated: Patient Education method: Explanation and  Handouts Education comprehension: verbalized understanding and downloaded app     HOME EXERCISE PROGRAM: See above in "today's treatment"     GOALS: Goals reviewed with patient? Yes   SHORT TERM GOALS: Target date: 04/01/2022     pt will produce loud /a/ with at least low 90s dB average over three sessions Baseline: 03/18/22 Goal status: Ongoing   2.  Pt will produce 16/20 sentence responses with WNL volume over two sessions Baseline:  Goal status: Ongoing   3.  Pt will produce 5 minutes simple conversation with volume upper 60s dB average over three sessions Baseline:  Goal status: Ongoing   4.  pt will generate abdominal breathing 80% of the time when engaging in 5 minutes simple conversation in 2 sessions Baseline:  Goal status: Ongoing   5.  Pt will indicate 100% successful medication administration for 7 days using strategies Baseline:  Goal status: Ongoing     LONG TERM GOALS: Target date: 05/02/2022     Pt will score higher than 21/32 on CES in the final two ST sessions Baseline:  Goal status: Ongoing   2.  Pt will demo successful usage of anomia compensations PRN in 10 minutes mod complex conversation in 2 sessions Baseline:  Goal status: Ongoing   3.  Pt will maintain average of upper 60s dB over 10 minute simple-mod complex conversation with rare min A in three sessions  Baseline:  Goal status: Ongoing   4.  pt will use abdominal breathing 75% of the time in 10 minutes simple-mod complex conversation in three sessions  Baseline:  Goal status: Ongoing   5.  Pt will indicate 100% successful medication administration after 04/01/21 for 14 days, using strategies  Baseline:  Goal status: Ongoing     ASSESSMENT:   CLINICAL IMPRESSION: Patient is a 71 y.o. male who was seen today for assessment of speech clarity who demonstrated mod dysarthria and reduced breath support due to Parkinson's disease. See tx note. Cont with current POC. Pt would benefit from  skilled ST targeting loudness in conversation, anomia strategies, and developing simple memory strategy for med administration due to occasional accidental skipping of meds due to swiping or stopping alarms without taking medication. Pt's OT will target attention.    OBJECTIVE IMPAIRMENTS  Objective impairments include memory, aphasia, and dysarthria. These impairments are limiting patient from managing medications, household responsibilities, and effectively communicating at home and in community.Factors affecting potential to achieve goals and functional outcome are previous level of function and severity of impairments.. Patient will benefit from skilled SLP services to address above impairments and improve overall function.   REHAB POTENTIAL: Good   PLAN: SLP FREQUENCY: 2x/week   SLP DURATION: 8 weeks   PLANNED INTERVENTIONS: Language facilitation, Environmental controls, Cueing hierachy, Internal/external aids, Functional tasks, Multimodal communication approach, SLP instruction and feedback, and Compensatory strategies   Lucious Zou, CCC-SLP 03/18/2022, 2:15 PM

## 2022-03-19 NOTE — Therapy (Signed)
OUTPATIENT PHYSICAL THERAPY TREATMENT NOTE   Patient Name: [name] [name] MRN: [id number] DOB:02-Feb-1951, 71 y.o., male Today's Date: 03/20/2022  PCP: [name] DO REFERRING PROVIDER: Tat, Eustace Quail, DO  END OF SESSION:   PT End of Session - 03/20/22 1611     Visit Number 4    Number of Visits 9    Date for PT Re-Evaluation 04/04/22    Authorization Type Medicare/BCBS    PT Start Time 1532    PT Stop Time 1610    PT Time Calculation (min) 38 min    Equipment Utilized During Treatment Gait belt    Activity Tolerance Patient tolerated treatment well    Behavior During Therapy WFL for tasks assessed/performed               Past Medical History:  Diagnosis Date   Cervical lymphadenopathy    right - followed by ENT   DOE (dyspnea on exertion) 07/15/2018   Dysphagia, neurologic 07/27/2014   Dystonia 1994   diagnosed in [place]   Enlarged lymph nodes 07/28/2007   Gastroesophageal reflux disease 03/07/2020   Localized swelling of right lower extremity 04/12/2019   Low back pain 11/21/2011   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   Mild neurocognitive disorder due to Parkinson's disease 01/10/2022   Non-Hodgkin lymphoma 2007   diffuse- 6 cycles of chemo with R CHOP Rituxan - last dose 10/08   Nonspecific elevation of levels of transaminase or lactic acid dehydrogenase (LDH) 07/06/2008   Parkinson's disease 07/27/2014   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   Past Surgical History:  Procedure Laterality Date   CHOLECYSTECTOMY, LAPAROSCOPIC     EYE SURGERY     x2 as child   PORT-A-CATH REMOVAL     PORTACATH PLACEMENT     SPLENECTOMY     TONSILLECTOMY     Patient Active Problem List   Diagnosis Date Noted   Mild neurocognitive disorder due to Parkinson's disease 01/10/2022   Gastroesophageal reflux disease 03/07/2020   Localized swelling of right lower extremity 04/12/2019   DOE (dyspnea on exertion) 07/15/2018   10 year risk  of MI or stroke 7.5% or greater 04/12/2018   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   Dysphagia, neurologic 07/27/2014   Parkinson's disease ([place]) 07/27/2014   Dystonia 07/10/2014   Low back pain 11/21/2011   Nonspecific elevation of levels of transaminase or lactic acid dehydrogenase (LDH) 07/06/2008   Enlarged lymph nodes 07/28/2007   Non-Hodgkin lymphoma 03/23/2007    REFERRING DIAG: Parkinson's disease   THERAPY DIAG:  Unsteadiness on feet  Other abnormalities of gait and mobility  Abnormal posture  Rationale for Evaluation and Treatment Rehabilitation  PERTINENT HISTORY: See above  PRECAUTIONS: Fall risk  SUBJECTIVE: Nothing new in the past 48 hours.    PAIN:  Are you having pain? No   OBJECTIVE:     TODAY'S TREATMENT: 03/20/22 Activity Comments  L5 x 6 min (Ues/Les) Cues to maintain ~80 SPM  fwd/back weight shifts + arm swing Good amplitude throughout with good coordination of UE/LE movement   lateral weight shifts + arm reach Good amplitude with arm movement; tendency to rush and movements to run together   trunk rotation + clap 10x   Grapevine with CGA 6x37f Cueing for sequencing of stepping and widen steps   resisted walking forward and back with green TB around ankle Good control   Resisted R/L fwd/back stepping with green TB 10x each Focus on quick  steps while maintaining long step length      PATIENT EDUCATION: Education details: HEP update Person educated: Patient Education method: Explanation, Demonstration, Tactile cues, Verbal cues, and Handouts Education comprehension: verbalized understanding and returned demonstration  HOME EXERCISE PROGRAM Last updated: 03/20/22 Access Code: [email or web address] URL: [email or web address] Date: 03/20/2022 Prepared by: [facility]  Program Notes To help decrease freezing episodes later in the evening:In  sitting:  -March in place x 5-10 reps with emphasis on stomping.-Kick leg forward 5-10 reps, alternating legs, with emphasis on Big kickIn standing:-March in place x 5-10 reps with emphasis on stomping to lift feet -BIG step to start walkingFour square step activity/Box step:Step forward, Right, Back, then Left, with BIG step for foot clearance each direction (clockwise); then reverse to step in counter clockwise direction.  Repeat 3 times each direction  Exercises - Circular Shoulder Pendulum with Table Support  - 1 x daily - 7 x weekly - 1 sets - 10 reps - Doorway Pec Stretch at 60 Elevation  - 1 x daily - 5 x weekly - 1 sets - 3 reps - 15-30 sec hold - Seated Scapular Retraction  - 1 x daily - 5 x weekly - 1 sets - 10 reps - Step Sideways with Arms Reaching  - 1 x daily - 5 x weekly - 2 sets - 10 reps - [place] with Counter Support  - 1 x daily - 5 x weekly - 2 sets - 10 reps   -------------------------------------------------------------------------------------------------------------------- (objective measures completed at initial evaluation unless otherwise dated)  COGNITION: Overall cognitive status:  Mild Cognitive Impairment-dx 12/2021             POSTURE: rounded shoulders, forward head, and flexed trunk    LOWER EXTREMITY ROM:   WFL for BLEs     LOWER EXTREMITY MMT:     MMT Right Eval Left Eval  Hip flexion 4/5 4/5  Hip extension      Hip abduction      Hip adduction      Hip internal rotation      Hip external rotation      Knee flexion 4/5 4+/5  Knee extension 4/5 4/5  Ankle dorsiflexion 4/5 4/5  Ankle plantarflexion      Ankle inversion      Ankle eversion      (Blank rows = not tested)     TRANSFERS: Assistive device utilized: None  Sit to stand: Complete Independence Stand to sit: Complete Independence     GAIT: Gait pattern: step through pattern, decreased step length- Right, decreased step length- Left, and trunk flexed Distance walked: 60 ft x 4  reps Assistive device utilized: None Level of assistance: Modified independence Comments: Pt does report occasional freezing episodes with gait, going up on toes upon initiating gait; not noted in eval today.   FUNCTIONAL TESTs:  5 times sit to stand: 8.29 sec with slight retropulsion.  Reports difficulty getting up from the sofa. Timed up and go (TUG): 8.65 sec TUG cognitive:10.69 sec TUG manual:  8.68 sec 48M gait velocity:  9.10 sec (3.6 ft/sec) MiniBESTest:  24/28 (see flow sheet for details)   [facility] PT Assessment - 03/05/22 0001                Standardized Balance Assessment    Standardized Balance Assessment Mini-BESTest          Mini-BESTest    Sit To Stand Normal: Comes to stand without use of  hands and stabilizes independently.     Rise to Toes Normal: Stable for 3 s with maximum height.     Stand on one leg (left) Moderate: < 20 s   6.04, 4.6    Stand on one leg (right) Moderate: < 20 s   1.78, 2.19    Stand on one leg - lowest score 1     Compensatory Stepping Correction - Forward Normal: Recovers independently with a single, large step (second realignement is allowed).     Compensatory Stepping Correction - Backward Normal: Recovers independently with a single, large step     Compensatory Stepping Correction - Left Lateral Normal: Recovers independently with 1 step (crossover or lateral OK)     Compensatory Stepping Correction - Right Lateral Normal: Recovers independently with 1 step (crossover or lateral OK)     Stepping Corredtion Lateral - lowest score 2     Stance - Feet together, eyes open, firm surface  Normal: 30s     Stance - Feet together, eyes closed, foam surface  Moderate: < 30s     Incline - Eyes Closed Normal: Stands independently 30s and aligns with gravity     Change in Gait Speed Normal: Significantly changes walkling speed without imbalance     Walk with head turns - Horizontal Moderate: performs head turns with reduction in gait speed.     Walk with  pivot turns Normal: Turns with feet close FAST (< 3 steps) with good balance.     Step over obstacles Normal: Able to step over box with minimal change of gait speed and with good balance.     Timed UP & GO with Dual Task Moderate: Dual Task affects either counting OR walking (>10%) when compared to the TUG without Dual Task.     Mini-BEST total score 24                      03/18/22: M-CTSIB *tendency to lean posterior and R   Condition 1: Firm Surface, EO 30 Sec, Normal Sway  Condition 2: Firm Surface, EC 30 Sec, Mild Sway  Condition 3: Foam Surface, EO 30 Sec, Mild Sway  Condition 4: Foam Surface, EC 30 Sec, Moderate Sway     PATIENT EDUCATION: Education details: PT eval results, POC Person educated: Patient Education method: Explanation Education comprehension: verbalized understanding     HOME EXERCISE PROGRAM: REcommendation to continue previous exercises from previous bout of therapy: MedBridge MPPTGELP and standing PWR! Moves   -----------------------------------------------------------------------------------------------------------------------------    GOALS: Goals reviewed with patient? Yes   SHORT TERM GOALS: = LTGs   LONG TERM GOALS: Target date: 04/04/2022   Pt will be independent with HEP for improved strength, balance, transfers, and gait. Baseline:  Goal status: IN PROGRESS   2.  Pt will perform 8 of 10 reps of sit<>stand from <18" surface, without retropulsion or LOB, to demonstrate improved functional strength and transfer efficiency. Baseline:  Goal status: IN PROGRESS   3.  Pt will verbalize tips to reduce freezing/festination with gait and turns.  Baseline:  Goal status: IN PROGRESS   4.  Pt will verbalize plans for continued community fitness to maximize gains made in PT. Baseline:  Goal status: IN PROGRESS   ASSESSMENT:   CLINICAL IMPRESSION: Patient arrived to session without complaints. Worked on high amplitude movements with mirror  feedback to allow for improved self-awareness and self-correction. Patient received cues to slow down and prevent movements from running together and increase force  with clapping to encourage audible clap. Initiated grapevine walking for balance and coordination; patient required cues for larger step length. Update HEP with exercises that were well-tolerated today. Patient reported understanding and without complaints at end of session.    OBJECTIVE IMPAIRMENTS Abnormal gait, decreased balance, decreased mobility, difficulty walking, decreased strength, impaired flexibility, and postural dysfunction.    ACTIVITY LIMITATIONS standing, transfers, and locomotion level   PARTICIPATION LIMITATIONS: community activity and participation in PD fitness class   PERSONAL FACTORS 3+ comorbidities:    Recent neurocognitive testing; MCI; cervical lymphadenopathy, dystonia, GERD, Meige syndrome, non-Hodgkin Lymphoma are also affecting patient's functional outcome.    REHAB POTENTIAL: Good   CLINICAL DECISION MAKING: Evolving/moderate complexity   EVALUATION COMPLEXITY: Moderate   PLAN: PT FREQUENCY: 2x/week   PT DURATION: 4 weeks plus eval week   PLANNED INTERVENTIONS: Therapeutic exercises, Therapeutic activity, Neuromuscular re-education, Balance training, Gait training, Patient/Family education, Joint mobilization, and Manual therapy   PLAN FOR NEXT SESSION: continue high level balance activities; is patient ready to resume PWR classes?       Janene Harvey, PT, DPT 03/20/22 4:14 PM  [facility] [address] [place], [place], [place] [address] Phone # [phone] Fax # [phone]

## 2022-03-20 ENCOUNTER — Encounter: Payer: Self-pay | Admitting: Physical Therapy

## 2022-03-20 ENCOUNTER — Ambulatory Visit: Payer: Medicare Other

## 2022-03-20 ENCOUNTER — Ambulatory Visit: Payer: Medicare Other | Admitting: Physical Therapy

## 2022-03-20 DIAGNOSIS — R4701 Aphasia: Secondary | ICD-10-CM

## 2022-03-20 DIAGNOSIS — R2681 Unsteadiness on feet: Secondary | ICD-10-CM | POA: Diagnosis not present

## 2022-03-20 DIAGNOSIS — R41841 Cognitive communication deficit: Secondary | ICD-10-CM

## 2022-03-20 DIAGNOSIS — R29818 Other symptoms and signs involving the nervous system: Secondary | ICD-10-CM | POA: Diagnosis not present

## 2022-03-20 DIAGNOSIS — R2689 Other abnormalities of gait and mobility: Secondary | ICD-10-CM

## 2022-03-20 DIAGNOSIS — R293 Abnormal posture: Secondary | ICD-10-CM

## 2022-03-20 DIAGNOSIS — R471 Dysarthria and anarthria: Secondary | ICD-10-CM | POA: Diagnosis not present

## 2022-03-20 NOTE — Therapy (Signed)
OUTPATIENT SPEECH LANGUAGE PATHOLOGY TREATMENT NOTE   Patient Name: [name] [name] MRN: [id number] DOB:[birth year], 71 y.o., male Today's Date: 03/20/2022  PCP: [name], DO REFERRING PROVIDER: Loralie Champagne, DO  END OF SESSION:   End of Session - 03/20/22 1452     Visit Number 4    Number of Visits 17    Date for SLP Re-Evaluation 05/02/22    SLP Start Time 1450    SLP Stop Time  4098    SLP Time Calculation (min) 40 min    Activity Tolerance Patient tolerated treatment well             Past Medical History:  Diagnosis Date   Cervical lymphadenopathy    right - followed by ENT   DOE (dyspnea on exertion) 07/15/2018   Dysphagia, neurologic 07/27/2014   Dystonia 1994   diagnosed in [place]   Enlarged lymph nodes 07/28/2007   Gastroesophageal reflux disease 03/07/2020   Localized swelling of right lower extremity 04/12/2019   Low back pain 11/21/2011   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   Mild neurocognitive disorder due to Parkinson's disease 01/10/2022   Non-Hodgkin lymphoma 2007   diffuse- 6 cycles of chemo with R CHOP Rituxan - last dose 10/08   Nonspecific elevation of levels of transaminase or lactic acid dehydrogenase (LDH) 07/06/2008   Parkinson's disease 07/27/2014   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   Past Surgical History:  Procedure Laterality Date   CHOLECYSTECTOMY, LAPAROSCOPIC     EYE SURGERY     x2 as child   PORT-A-CATH REMOVAL     PORTACATH PLACEMENT     SPLENECTOMY     TONSILLECTOMY     Patient Active Problem List   Diagnosis Date Noted   Mild neurocognitive disorder due to Parkinson's disease 01/10/2022   Gastroesophageal reflux disease 03/07/2020   Localized swelling of right lower extremity 04/12/2019   DOE (dyspnea on exertion) 07/15/2018   10 year risk of MI or stroke 7.5% or greater 04/12/2018   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   Meige syndrome (blepharospasm with oromandibular dystonia)  07/27/2014   Dysphagia, neurologic 07/27/2014   Parkinson's disease ([place]) 07/27/2014   Dystonia 07/10/2014   Low back pain 11/21/2011   Nonspecific elevation of levels of transaminase or lactic acid dehydrogenase (LDH) 07/06/2008   Enlarged lymph nodes 07/28/2007   Non-Hodgkin lymphoma 03/23/2007    ONSET DATE: Dx in mid-2010s  REFERRING DIAG: G20 - Parkinson's disease   THERAPY DIAG:  Dysarthria and anarthria  Cognitive communication deficit  Aphasia  Rationale for Evaluation and Treatment Rehabilitation  SUBJECTIVE: "I did my "ah"s and my sentences in front of my wife for the first time - and I got a kiss. She said, 'I hear you!.' "  PAIN:  Are you having pain? No   OBJECTIVE:   TODAY'S TREATMENT: 03/20/22: Louder speech was targeted this session with loud /a/ at average in low 90s, everyday sentences with average upper upper 70s - low 80s dB. With sentence level responses pt had average of mid 60s- upper 60s dB loudness. In multiple sentences, pt loudness dropped to low-mid 60s dB, even after encouragement to incr volume.  Pt stated he has been 100% with his meds since previous session. Homework for sentence level reading and responses; Instructed pt to work 15-30 minutes BID.  03/18/22: [name] [name] agreed with STGs and LTGs when discussed with him. SLP and pt talked about how pt could make 7 days consecutive with WNL medication administration. He set second  alarms on his phone for 5 minutes after alarms set on his watch, in order to hear one of the two and then administer his own meds. Regarding "s" statement, [name] [name] made 3 phone calls this morning without anyone asking him to repeat. Louder speech was targeted this session with loud /a/ at average in low 90s, everyday sentences with average upper 70s low 80s dB. Pt and SLP talked for approx 5 minutes about the need to complete loud /a/ and sentences BID and not QD. SLP targeted abdominal breathing (AB) with pt and he req'd min cues  initially however was independent after approx 60 seconds, at rest.   03/12/22: SLP targeted increased vocal intensity this session. Pt reports that on average he completes 6 sustained /a/. Sustained /a/ (dB): 88, 86, 92, 95, 96, 92, 93, 95, 93, 89 dB. Pitch glides: x10. Functional sentences: 2 sets of 10 - First set (over the fence voice): WFL dB  -Second set (authoritative): ~65 dB; increased with verbal and visual cueing  PATIENT EDUCATION: Education details: see above in "today's treatment" Person educated: Patient Education method: Explanation and Handouts Education comprehension: verbalized understanding and downloaded app     HOME EXERCISE PROGRAM: See above in "today's treatment"     GOALS: Goals reviewed with patient? Yes   SHORT TERM GOALS: Target date: 04/01/2022     pt will produce loud /a/ with at least low 90s dB average over three sessions Baseline: 03/18/22, 03/20/22 Goal status: Ongoing   2.  Pt will produce 16/20 sentence responses with WNL volume over two sessions Baseline:  Goal status: Ongoing   3.  Pt will produce 5 minutes simple conversation with volume upper 60s dB average over three sessions Baseline:  Goal status: Ongoing   4.  pt will generate abdominal breathing 80% of the time when engaging in 5 minutes simple conversation in 2 sessions Baseline:  Goal status: Ongoing   5.  Pt will indicate 100% successful medication administration for 7 days using strategies Baseline:  Goal status: Ongoing     LONG TERM GOALS: Target date: 05/02/2022     Pt will score higher than 21/32 on CES in the final two ST sessions Baseline:  Goal status: Ongoing   2.  Pt will demo successful usage of anomia compensations PRN in 10 minutes mod complex conversation in 2 sessions Baseline:  Goal status: Ongoing   3.  Pt will maintain average of upper 60s dB over 10 minute simple-mod complex conversation with rare min A in three sessions  Baseline:  Goal status:  Ongoing   4.  pt will use abdominal breathing 75% of the time in 10 minutes simple-mod complex conversation in three sessions  Baseline:  Goal status: Ongoing   5.  Pt will indicate 100% successful medication administration after 04/01/21 for 14 days, using strategies  Baseline:  Goal status: Ongoing     ASSESSMENT:   CLINICAL IMPRESSION: Patient is a 71 y.o. male who was seen today for assessment of speech clarity who demonstrated mod dysarthria and reduced breath support due to Parkinson's disease. See tx note. Cont with current POC. Pt would benefit from skilled ST targeting loudness in conversation, anomia strategies, and developing simple memory strategy for med administration due to occasional accidental skipping of meds due to swiping or stopping alarms without taking medication. Pt's OT will target attention.    OBJECTIVE IMPAIRMENTS  Objective impairments include memory, aphasia, and dysarthria. These impairments are limiting patient from managing medications, household responsibilities,  and effectively communicating at home and in community.Factors affecting potential to achieve goals and functional outcome are previous level of function and severity of impairments.. Patient will benefit from skilled SLP services to address above impairments and improve overall function.   REHAB POTENTIAL: Good   PLAN: SLP FREQUENCY: 2x/week   SLP DURATION: 8 weeks   PLANNED INTERVENTIONS: Language facilitation, Environmental controls, Cueing hierachy, Internal/external aids, Functional tasks, Multimodal communication approach, SLP instruction and feedback, and Compensatory strategies   Ashlye Oviedo, CCC-SLP 03/20/2022, 2:53 PM

## 2022-03-26 ENCOUNTER — Ambulatory Visit: Payer: Medicare Other | Attending: Neurology | Admitting: Occupational Therapy

## 2022-03-26 ENCOUNTER — Ambulatory Visit: Payer: Medicare Other | Admitting: Physical Therapy

## 2022-03-26 DIAGNOSIS — R2689 Other abnormalities of gait and mobility: Secondary | ICD-10-CM | POA: Diagnosis not present

## 2022-03-26 DIAGNOSIS — R471 Dysarthria and anarthria: Secondary | ICD-10-CM | POA: Insufficient documentation

## 2022-03-26 DIAGNOSIS — R2681 Unsteadiness on feet: Secondary | ICD-10-CM | POA: Insufficient documentation

## 2022-03-26 DIAGNOSIS — R29818 Other symptoms and signs involving the nervous system: Secondary | ICD-10-CM | POA: Diagnosis not present

## 2022-03-26 DIAGNOSIS — R41841 Cognitive communication deficit: Secondary | ICD-10-CM | POA: Insufficient documentation

## 2022-03-26 DIAGNOSIS — R278 Other lack of coordination: Secondary | ICD-10-CM | POA: Diagnosis not present

## 2022-03-26 DIAGNOSIS — R293 Abnormal posture: Secondary | ICD-10-CM

## 2022-03-26 DIAGNOSIS — R4701 Aphasia: Secondary | ICD-10-CM | POA: Insufficient documentation

## 2022-03-26 NOTE — Therapy (Signed)
OUTPATIENT OCCUPATIONAL THERAPY  Treatment Session  Patient Name: [name] [name] MRN: [id number] DOB:12/22/1950, 71 y.o., male Today's Date: 03/27/2022  PCP: [name], DO REFERRING PROVIDER: Ludwig Clarks, DO    OT End of Session - 03/26/22 1545     Visit Number 2    Number of Visits 9    Date for OT Re-Evaluation 05/02/22    Authorization Type Medicare A & B    OT Start Time 1538    OT Stop Time 1617    OT Time Calculation (min) 39 min    Activity Tolerance Patient tolerated treatment well    Behavior During Therapy WFL for tasks assessed/performed              Past Medical History:  Diagnosis Date   Cervical lymphadenopathy    right - followed by ENT   DOE (dyspnea on exertion) 07/15/2018   Dysphagia, neurologic 07/27/2014   Dystonia 1994   diagnosed in [place]   Enlarged lymph nodes 07/28/2007   Gastroesophageal reflux disease 03/07/2020   Localized swelling of right lower extremity 04/12/2019   Low back pain 11/21/2011   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   Mild neurocognitive disorder due to Parkinson's disease 01/10/2022   Non-Hodgkin lymphoma 2007   diffuse- 6 cycles of chemo with R CHOP Rituxan - last dose 10/08   Nonspecific elevation of levels of transaminase or lactic acid dehydrogenase (LDH) 07/06/2008   Parkinson's disease 07/27/2014   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   Past Surgical History:  Procedure Laterality Date   CHOLECYSTECTOMY, LAPAROSCOPIC     EYE SURGERY     x2 as child   PORT-A-CATH REMOVAL     PORTACATH PLACEMENT     SPLENECTOMY     TONSILLECTOMY     Patient Active Problem List   Diagnosis Date Noted   Mild neurocognitive disorder due to Parkinson's disease 01/10/2022   Gastroesophageal reflux disease 03/07/2020   Localized swelling of right lower extremity 04/12/2019   DOE (dyspnea on exertion) 07/15/2018   10 year risk of MI or stroke 7.5% or greater 04/12/2018    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   Dysphagia, neurologic 07/27/2014   Parkinson's disease ([place]) 07/27/2014   Dystonia 07/10/2014   Low back pain 11/21/2011   Nonspecific elevation of levels of transaminase or lactic acid dehydrogenase (LDH) 07/06/2008   Enlarged lymph nodes 07/28/2007   Non-Hodgkin lymphoma 03/23/2007    ONSET DATE: referral date 01/30/22 (diagnosed with PD in 2015)  REFERRING DIAG: G20 (ICD-10-CM) - Parkinson's disease   THERAPY DIAG:  Other symptoms and signs involving the nervous system  Unsteadiness on feet  Abnormal posture  Other lack of coordination  Rationale for Evaluation and Treatment Rehabilitation  SUBJECTIVE:   SUBJECTIVE STATEMENT: Pt reports that LB dressing is challenging, especially donning compression socks. Pt accompanied by: self  PERTINENT HISTORY: PD, Chronic pain R shoulder; PISA syndrome   PAIN:  Are you having pain? No   PATIENT GOALS a tune up to maintain progress made during previous OT sessions  OBJECTIVE:   TODAY'S TREATMENT:  Initiated bag exercises with focus on large amplitude movements to carry over to dressing tasks.  Pt able to complete overhead reaching to simulate donning shirt, however significantly increased difficulty when began engaging in simulated LB dressing with BUE reaching forward to scoop bag under foot.   Transitioned to having pt complete simulated LB dressing to assess current technique.  Pt demonstrating leaning  against wall and completing figure 4 stance to thread each pant leg with close supervision for balance.  Therapist educated on sitting to complete LB dressing.  Therapist provided demonstration with use of gait belt and pajama pants to demonstrate figure 4 technique in sitting.  Pt returned demonstration with use of gait belt and minimal increase in time, however not requiring supervision/assistance for balance.  Discussed  techniques with donning socks and shoes, however pt not wanting to attempt socks this session due to how difficult they are to get on. Engaged in sidestepping and backwards stepping to simulate accessing wife's adaptive van.  Pt reports increased difficulty getting up into van.  Discussed possible techniques and encouraged pt to take pictures, bring measurements, and/or have van at a future therapy session to focus on access in to [place].   PATIENT EDUCATION: Education details: ongoing condition specific education Person educated: Patient Education method: Explanation Education comprehension: verbalized understanding   HOME EXERCISE PROGRAM: TBD    GOALS: Goals reviewed with patient? No   LONG TERM GOALS: Target date: 05/02/22 (note: only LTGs due to plan to begin OT in about 3-4 weeks to allow pt to focus treatment on PT and SLP then phase in OT as PT phases out)  STG/ LTG  Status:  1 Pt will be Independent with PD specific HEP Baseline:  Progressing  2 Pt will verbalize understanding of adapted strategies and AE to maximize safety and I with ADLs/ IADLs  Baseline:  Progressing  3 Pt will demonstrate improved UE functional use for ADLs as evidenced by increasing box/ blocks score by 3 blocks with [name]/LUE Baseline: R: 44 and L: 41 (however hitting barrier multiple times on R) Progressing  4 Pt will demonstrate improved alternating attention by completing  table top scanning activity with Supervision Baseline:  Progressing  5 Pt will demonstrate understanding of memory compensations and ways to keep thinking skills sharp. Baseline: alarm goes off for his med and he snoozes or stops it and doesn't take his med. Progressing    ASSESSMENT:  CLINICAL IMPRESSION: Patient seen for first treatment session post evaluation.  Pt reports noticing an overall slowing with ADL, IADLs, and mobility.  Pt stating most difficulty is with LB dressing and getting in/out of wife's adaptive van.  Pt  receptive to education of LB dressing at sit > stand level with figure 4 position instead of completing in standing for increased safety.  Pt with decreased desire to engage in donning/doffing socks due to extreme challenge secondary to compression socks - will continue to problem solve with pt.  PERFORMANCE DEFICITS in functional skills including ADLs, IADLs, coordination, dexterity, tone, ROM, strength, pain, flexibility, FMC, GMC, mobility, balance, body mechanics, endurance, decreased knowledge of precautions, decreased knowledge of use of DME, and UE functional use, cognitive skills including attention, memory, problem solving, and safety awareness, and psychosocial skills including environmental adaptation.   IMPAIRMENTS are limiting patient from ADLs, IADLs, leisure, and social participation.   COMORBIDITIES may have co-morbidities  that affects occupational performance. Patient will benefit from skilled OT to address above impairments and improve overall function.  MODIFICATION OR ASSISTANCE TO COMPLETE EVALUATION: No modification of tasks or assist necessary to complete an evaluation.  OT OCCUPATIONAL PROFILE AND HISTORY: Detailed assessment: Review of records and additional review of physical, cognitive, psychosocial history related to current functional performance.  CLINICAL DECISION MAKING: LOW - limited treatment options, no task modification necessary  REHAB POTENTIAL: Good  EVALUATION COMPLEXITY: Low    PLAN:  OT FREQUENCY: 2x/week  OT DURATION: 8 weeks  PLANNED INTERVENTIONS: self care/ADL training, therapeutic exercise, therapeutic activity, neuromuscular re-education, balance training, functional mobility training, ultrasound, moist heat, cryotherapy, patient/family education, cognitive remediation/compensation, psychosocial skills training, energy conservation, coping strategies training, and DME and/or AE instructions  RECOMMENDED OTHER SERVICES: NA  CONSULTED AND  AGREED WITH PLAN OF CARE: Patient  PLAN FOR NEXT SESSION: Initiate large amplitude exercises.  Review LB dressing, car transfers, handwriting, and focus on attention    [place], [place], OTR/L 03/27/2022, 10:04 AM

## 2022-03-26 NOTE — Therapy (Signed)
OUTPATIENT PHYSICAL THERAPY TREATMENT NOTE   Patient Name: [name] [name] MRN: [id number] DOB:11-Jun-1951, 71 y.o., male Today's Date: 03/27/2022  PCP: [name] DO REFERRING PROVIDER: Tat, Eustace Quail, DO  END OF SESSION:   PT End of Session - 03/26/22 1627     Visit Number 5    Number of Visits 9    Date for PT Re-Evaluation 04/04/22    Authorization Type Medicare/BCBS    PT Start Time 1622    PT Stop Time 1700    PT Time Calculation (min) 38 min    Equipment Utilized During Treatment --    Activity Tolerance Patient tolerated treatment well    Behavior During Therapy [facility] for tasks assessed/performed               Past Medical History:  Diagnosis Date   Cervical lymphadenopathy    right - followed by ENT   DOE (dyspnea on exertion) 07/15/2018   Dysphagia, neurologic 07/27/2014   Dystonia 1994   diagnosed in [place]   Enlarged lymph nodes 07/28/2007   Gastroesophageal reflux disease 03/07/2020   Localized swelling of right lower extremity 04/12/2019   Low back pain 11/21/2011   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   Mild neurocognitive disorder due to Parkinson's disease 01/10/2022   Non-Hodgkin lymphoma 2007   diffuse- 6 cycles of chemo with R CHOP Rituxan - last dose 10/08   Nonspecific elevation of levels of transaminase or lactic acid dehydrogenase (LDH) 07/06/2008   Parkinson's disease 07/27/2014   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   Past Surgical History:  Procedure Laterality Date   CHOLECYSTECTOMY, LAPAROSCOPIC     EYE SURGERY     x2 as child   PORT-A-CATH REMOVAL     PORTACATH PLACEMENT     SPLENECTOMY     TONSILLECTOMY     Patient Active Problem List   Diagnosis Date Noted   Mild neurocognitive disorder due to Parkinson's disease 01/10/2022   Gastroesophageal reflux disease 03/07/2020   Localized swelling of right lower extremity 04/12/2019   DOE (dyspnea on exertion) 07/15/2018   10 year risk of MI or  stroke 7.5% or greater 04/12/2018   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   Dysphagia, neurologic 07/27/2014   Parkinson's disease ([place]) 07/27/2014   Dystonia 07/10/2014   Low back pain 11/21/2011   Nonspecific elevation of levels of transaminase or lactic acid dehydrogenase (LDH) 07/06/2008   Enlarged lymph nodes 07/28/2007   Non-Hodgkin lymphoma 03/23/2007    REFERRING DIAG: Parkinson's disease   THERAPY DIAG:  Other symptoms and signs involving the nervous system  Unsteadiness on feet  Abnormal posture  Rationale for Evaluation and Treatment Rehabilitation  PERTINENT HISTORY: See above  PRECAUTIONS: Fall risk  SUBJECTIVE: Been doing the exercises most days at home.      PAIN:  Are you having pain? No   OBJECTIVE:    TODAY"S TREATMENT, 03/26/2022:  NuStep, Level 4, 8 minutes, 4 extremities, keeping SPM >80, Rates effort level as 6-7/10  Pt performs PWR! Moves in quadruped position x 10-20 reps   PWR! Up for improved posture-trialed x 10 in quadruped, with decreased eccentric control from tall kneel>quadruped; worked in modified position, with UEs at mat x 10, with improved control.  Cues for glut activation in tall kneel.  PWR! Rock for improved weighshifting  PWR! Twist for improved trunk rotation-x 5 reps each side, then alternating sides x 10 reps  PWR! Step for improved step initiation -  attempted from quadruped position with considerable difficulty coming to step position.  Transitioned to perform modified at mat with UE support.  Pt requires extra time to bring leg forward.  Worked on diagonal weightshifting to increase flexibility through hip when in 1/2 kneel supported UE position.  Gait 120 ft x 5 reps with attention to arm swing, step length, posture at end of session  PATIENT EDUCATION: Education details: use of pt's recumbent bike at home-recommended 3x/wk, at least 10-15 minutes,  effort/intensity level of 6-8/10; discussed and assisted pt with setting reminder (daily) on phone for exercise bike use; discussed PWR! Moves quadruped positioning for return to ex class (pt will need to modify PWR! Up and PWR! Step at chair with UE support) Person educated: Patient Education method: Explanation and Demonstration Education comprehension: verbalized understanding   HOME EXERCISE PROGRAM Last updated: 03/20/22 Access Code: [email or web address] URL: [email or web address] Date: 03/20/2022 Prepared by: [facility]  Program Notes To help decrease freezing episodes later in the evening:In sitting:  -March in place x 5-10 reps with emphasis on stomping.-Kick leg forward 5-10 reps, alternating legs, with emphasis on Big kickIn standing:-March in place x 5-10 reps with emphasis on stomping to lift feet -BIG step to start walkingFour square step activity/Box step:Step forward, Right, Back, then Left, with BIG step for foot clearance each direction (clockwise); then reverse to step in counter clockwise direction.  Repeat 3 times each direction  Exercises - Circular Shoulder Pendulum with Table Support  - 1 x daily - 7 x weekly - 1 sets - 10 reps - Doorway Pec Stretch at 60 Elevation  - 1 x daily - 5 x weekly - 1 sets - 3 reps - 15-30 sec hold - Seated Scapular Retraction  - 1 x daily - 5 x weekly - 1 sets - 10 reps - Step Sideways with Arms Reaching  - 1 x daily - 5 x weekly - 2 sets - 10 reps - [place] with Counter Support  - 1 x daily - 5 x weekly - 2 sets - 10 reps   -------------------------------------------------------------------------------------------------------------------- (objective measures completed at initial evaluation unless otherwise dated)  COGNITION: Overall cognitive status:  Mild Cognitive Impairment-dx 12/2021             POSTURE: rounded shoulders, forward head, and flexed trunk    LOWER EXTREMITY ROM:   WFL for  BLEs     LOWER EXTREMITY MMT:     MMT Right Eval Left Eval  Hip flexion 4/5 4/5  Hip extension      Hip abduction      Hip adduction      Hip internal rotation      Hip external rotation      Knee flexion 4/5 4+/5  Knee extension 4/5 4/5  Ankle dorsiflexion 4/5 4/5  Ankle plantarflexion      Ankle inversion      Ankle eversion      (Blank rows = not tested)     TRANSFERS: Assistive device utilized: None  Sit to stand: Complete Independence Stand to sit: Complete Independence     GAIT: Gait pattern: step through pattern, decreased step length- Right, decreased step length- Left, and trunk flexed Distance walked: 60 ft x 4 reps Assistive device utilized: None Level of assistance: Modified independence Comments: Pt does report occasional freezing episodes with gait, going up on toes upon initiating gait; not noted in eval today.   FUNCTIONAL TESTs:  5 times sit  to stand: 8.29 sec with slight retropulsion.  Reports difficulty getting up from the sofa. Timed up and go (TUG): 8.65 sec TUG cognitive:10.69 sec TUG manual:  8.68 sec 46M gait velocity:  9.10 sec (3.6 ft/sec) MiniBESTest:  24/28 (see flow sheet for details)   [facility] PT Assessment - 03/05/22 0001                Standardized Balance Assessment    Standardized Balance Assessment Mini-BESTest          Mini-BESTest    Sit To Stand Normal: Comes to stand without use of hands and stabilizes independently.     Rise to Toes Normal: Stable for 3 s with maximum height.     Stand on one leg (left) Moderate: < 20 s   6.04, 4.6    Stand on one leg (right) Moderate: < 20 s   1.78, 2.19    Stand on one leg - lowest score 1     Compensatory Stepping Correction - Forward Normal: Recovers independently with a single, large step (second realignement is allowed).     Compensatory Stepping Correction - Backward Normal: Recovers independently with a single, large step     Compensatory Stepping Correction - Left Lateral Normal:  Recovers independently with 1 step (crossover or lateral OK)     Compensatory Stepping Correction - Right Lateral Normal: Recovers independently with 1 step (crossover or lateral OK)     Stepping Corredtion Lateral - lowest score 2     Stance - Feet together, eyes open, firm surface  Normal: 30s     Stance - Feet together, eyes closed, foam surface  Moderate: < 30s     Incline - Eyes Closed Normal: Stands independently 30s and aligns with gravity     Change in Gait Speed Normal: Significantly changes walkling speed without imbalance     Walk with head turns - Horizontal Moderate: performs head turns with reduction in gait speed.     Walk with pivot turns Normal: Turns with feet close FAST (< 3 steps) with good balance.     Step over obstacles Normal: Able to step over box with minimal change of gait speed and with good balance.     Timed UP & GO with Dual Task Moderate: Dual Task affects either counting OR walking (>10%) when compared to the TUG without Dual Task.     Mini-BEST total score 24                      03/18/22: M-CTSIB *tendency to lean posterior and R   Condition 1: Firm Surface, EO 30 Sec, Normal Sway  Condition 2: Firm Surface, EC 30 Sec, Mild Sway  Condition 3: Foam Surface, EO 30 Sec, Mild Sway  Condition 4: Foam Surface, EC 30 Sec, Moderate Sway     PATIENT EDUCATION: Education details: PT eval results, POC Person educated: Patient Education method: Explanation Education comprehension: verbalized understanding     HOME EXERCISE PROGRAM: REcommendation to continue previous exercises from previous bout of therapy: MedBridge [email or web address] and standing PWR! Moves   -----------------------------------------------------------------------------------------------------------------------------    GOALS: Goals reviewed with patient? Yes   SHORT TERM GOALS: = LTGs   LONG TERM GOALS: Target date: 04/04/2022   Pt will be independent with HEP for improved strength,  balance, transfers, and gait. Baseline:  Goal status: IN PROGRESS   2.  Pt will perform 8 of 10 reps of sit<>stand from <18" surface, without retropulsion or LOB, to  demonstrate improved functional strength and transfer efficiency. Baseline:  Goal status: IN PROGRESS   3.  Pt will verbalize tips to reduce freezing/festination with gait and turns.  Baseline:  Goal status: IN PROGRESS   4.  Pt will verbalize plans for continued community fitness to maximize gains made in PT. Baseline:  Goal status: IN PROGRESS   ASSESSMENT:   CLINICAL IMPRESSION: Skilled PT session today focused on aerobic activity with NuStep and with gait with increased arm swing.  Also focused on quadruped PWR! Moves, with pt having significant difficulty going from quadruped to tall kneel (PWR! UP) and quadruped to 1/2 kneel (PWR! Step).  Modified to perform with UE support, which pt is able to do much better.  Will continue to benefit from skilled PT towards goals for improved overall functional mobility and return to community exercise class.   OBJECTIVE IMPAIRMENTS Abnormal gait, decreased balance, decreased mobility, difficulty walking, decreased strength, impaired flexibility, and postural dysfunction.    ACTIVITY LIMITATIONS standing, transfers, and locomotion level   PARTICIPATION LIMITATIONS: community activity and participation in PD fitness class   PERSONAL FACTORS 3+ comorbidities:    Recent neurocognitive testing; MCI; cervical lymphadenopathy, dystonia, GERD, Meige syndrome, non-Hodgkin Lymphoma are also affecting patient's functional outcome.    REHAB POTENTIAL: Good   CLINICAL DECISION MAKING: Evolving/moderate complexity   EVALUATION COMPLEXITY: Moderate   PLAN: PT FREQUENCY: 2x/week   PT DURATION: 4 weeks plus eval week   PLANNED INTERVENTIONS: Therapeutic exercises, Therapeutic activity, Neuromuscular re-education, Balance training, Gait training, Patient/Family education, Joint  mobilization, and Manual therapy   PLAN FOR NEXT SESSION: PWR! Moves supine and prone and then discuss pt's return to [facility]! Moves classes.  Continue high level balance activities, including compliant surfaces.   Mady Haagensen, PT 03/27/22 7:44 AM Phone: [phone] Fax: [phone]    [facility] at [facility] [place], [place], [place] [address] Phone # [phone] Fax # [phone]

## 2022-03-27 ENCOUNTER — Encounter: Payer: Self-pay | Admitting: Physical Therapy

## 2022-03-27 DIAGNOSIS — L308 Other specified dermatitis: Secondary | ICD-10-CM | POA: Diagnosis not present

## 2022-03-27 DIAGNOSIS — L821 Other seborrheic keratosis: Secondary | ICD-10-CM | POA: Diagnosis not present

## 2022-03-27 DIAGNOSIS — L820 Inflamed seborrheic keratosis: Secondary | ICD-10-CM | POA: Diagnosis not present

## 2022-03-27 DIAGNOSIS — D2371 Other benign neoplasm of skin of right lower limb, including hip: Secondary | ICD-10-CM | POA: Diagnosis not present

## 2022-03-27 DIAGNOSIS — L720 Epidermal cyst: Secondary | ICD-10-CM | POA: Diagnosis not present

## 2022-03-27 DIAGNOSIS — D225 Melanocytic nevi of trunk: Secondary | ICD-10-CM | POA: Diagnosis not present

## 2022-04-02 ENCOUNTER — Encounter: Payer: Self-pay | Admitting: Physical Therapy

## 2022-04-02 ENCOUNTER — Encounter: Payer: Self-pay | Admitting: Speech Pathology

## 2022-04-02 ENCOUNTER — Ambulatory Visit: Payer: Medicare Other | Admitting: Physical Therapy

## 2022-04-02 ENCOUNTER — Encounter: Payer: Medicare Other | Admitting: Psychology

## 2022-04-02 ENCOUNTER — Ambulatory Visit: Payer: Medicare Other | Admitting: Speech Pathology

## 2022-04-02 DIAGNOSIS — R29818 Other symptoms and signs involving the nervous system: Secondary | ICD-10-CM | POA: Diagnosis not present

## 2022-04-02 DIAGNOSIS — R293 Abnormal posture: Secondary | ICD-10-CM

## 2022-04-02 DIAGNOSIS — R471 Dysarthria and anarthria: Secondary | ICD-10-CM | POA: Diagnosis not present

## 2022-04-02 DIAGNOSIS — R278 Other lack of coordination: Secondary | ICD-10-CM | POA: Diagnosis not present

## 2022-04-02 DIAGNOSIS — R2681 Unsteadiness on feet: Secondary | ICD-10-CM | POA: Diagnosis not present

## 2022-04-02 DIAGNOSIS — R4701 Aphasia: Secondary | ICD-10-CM | POA: Diagnosis not present

## 2022-04-02 NOTE — Patient Instructions (Signed)
Access Code: [email or web address] URL: [email or web address] Date: 04/02/2022 Prepared by: [facility]  Exercises - Supine Pelvic Tilt  - 1-2 x daily - 7 x weekly - 2 sets - 5 reps - Hooklying Single Knee to Chest Stretch  - 2-3 x daily - 7 x weekly - 1 sets - 5 reps - 5-10 sec hold - Lower Trunk Rotations  - 1-2 x daily - 7 x weekly - 1 sets - 5-10 reps

## 2022-04-02 NOTE — Therapy (Signed)
OUTPATIENT PHYSICAL THERAPY TREATMENT NOTE   Patient Name: [name] [name] MRN: [id number] DOB:Aug 20, 1951, 71 y.o., male Today's Date: 04/02/2022  PCP: [name] DO REFERRING PROVIDER: Tat, Eustace Quail, DO  END OF SESSION:   PT End of Session - 04/02/22 1720     Visit Number 6    Number of Visits 9    Date for PT Re-Evaluation 04/04/22    Authorization Type Medicare/BCBS    PT Start Time 1620    PT Stop Time 1702    PT Time Calculation (min) 42 min    Activity Tolerance Patient tolerated treatment well;Patient limited by pain   Initial pain 7/10, improved to 4/10 at end of session   Behavior During Therapy [facility] for tasks assessed/performed                Past Medical History:  Diagnosis Date   Cervical lymphadenopathy    right - followed by ENT   DOE (dyspnea on exertion) 07/15/2018   Dysphagia, neurologic 07/27/2014   Dystonia 1994   diagnosed in [place]   Enlarged lymph nodes 07/28/2007   Gastroesophageal reflux disease 03/07/2020   Localized swelling of right lower extremity 04/12/2019   Low back pain 11/21/2011   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   Mild neurocognitive disorder due to Parkinson's disease 01/10/2022   Non-Hodgkin lymphoma 2007   diffuse- 6 cycles of chemo with R CHOP Rituxan - last dose 10/08   Nonspecific elevation of levels of transaminase or lactic acid dehydrogenase (LDH) 07/06/2008   Parkinson's disease 07/27/2014   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   Past Surgical History:  Procedure Laterality Date   CHOLECYSTECTOMY, LAPAROSCOPIC     EYE SURGERY     x2 as child   PORT-A-CATH REMOVAL     PORTACATH PLACEMENT     SPLENECTOMY     TONSILLECTOMY     Patient Active Problem List   Diagnosis Date Noted   Mild neurocognitive disorder due to Parkinson's disease 01/10/2022   Gastroesophageal reflux disease 03/07/2020   Localized swelling of right lower extremity 04/12/2019   DOE (dyspnea on  exertion) 07/15/2018   10 year risk of MI or stroke 7.5% or greater 04/12/2018   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   Dysphagia, neurologic 07/27/2014   Parkinson's disease ([place]) 07/27/2014   Dystonia 07/10/2014   Low back pain 11/21/2011   Nonspecific elevation of levels of transaminase or lactic acid dehydrogenase (LDH) 07/06/2008   Enlarged lymph nodes 07/28/2007   Non-Hodgkin lymphoma 03/23/2007    REFERRING DIAG: Parkinson's disease   THERAPY DIAG:  Abnormal posture  Unsteadiness on feet  Rationale for Evaluation and Treatment Rehabilitation  PERTINENT HISTORY: See above  PRECAUTIONS: Fall risk  SUBJECTIVE: Did something to my back on Monday (not sure what, but had been very busy with a fundraiser event, and it began hurting in the evening).  Has been worse in the mornings.  Prior to Monday, was using the timer on my phone and was exercising (either the bike or walking everyday).  PAIN:  Are you having pain? Yes: NPRS scale: 7/10 Pain location: R low back  Pain description: dull, stiff Aggravating factors: worse pain in the mornings Relieving factors: moving through the day   OBJECTIVE:    TODAY'S TREATMENT: 04/02/2022 Activity Comments  Standing lumbar flexion stretch, 3 reps  Reports relief  Supine  low back flexibility:  ant/posterior pelvic tilts 2 x 5 reps Reports relief  SKTC using  pillow case to bring leg towards chest, 5 reps each side   Lower trunk rotation, 3 x 15 seconds, and additional rocking to relax in trunk rotation   Standing lateral weigthshiftng x 5 reps, then seated anterior/posterior pelvic tilts, x 5 reps with tactile/manual cues      Discussed/reviewed tips to reduce freezing and pt verbalizes understanding.   Also discussed the following for a technique in his closet for improved turning-rocking turn "shift and lift" to improve foot clearance with turns. Discussed return to  [facility]! Moves ex class including modifications he could use for various positions Discussed posture and positioning for sleep (use of pillows under knees or between knees in sidelying); avoiding static standing for prolonged periods; instead, trying to rock or change positions in standing to lessen back pain    PATIENT EDUCATION: Education details: HEP additions to address low back pain, other education above Person educated: Patient Education method: Explanation, Demonstration, Verbal cues, and Handouts Education comprehension: verbalized understanding and returned demonstration  Access Code: [email or web address] (NEW HEP additions to address low back pain this visit) URL: [email or web address] Date: 04/02/2022 Prepared by: [facility]  Exercises - Supine Pelvic Tilt  - 1-2 x daily - 7 x weekly - 2 sets - 5 reps - Hooklying Single Knee to Chest Stretch  - 2-3 x daily - 7 x weekly - 1 sets - 5 reps - 5-10 sec hold - Lower Trunk Rotations  - 1-2 x daily - 7 x weekly - 1 sets - 5-10 reps  HOME EXERCISE PROGRAM Last updated: 03/20/22 Access Code: [email or web address] URL: [email or web address] Date: 03/20/2022 Prepared by: [facility]  Program Notes To help decrease freezing episodes later in the evening:In sitting:  -March in place x 5-10 reps with emphasis on stomping.-Kick leg forward 5-10 reps, alternating legs, with emphasis on Big kickIn standing:-March in place x 5-10 reps with emphasis on stomping to lift feet -BIG step to start walkingFour square step activity/Box step:Step forward, Right, Back, then Left, with BIG step for foot clearance each direction (clockwise); then reverse to step in counter clockwise direction.  Repeat 3 times each direction  Exercises - Circular Shoulder Pendulum with Table Support  - 1 x daily - 7 x weekly - 1 sets - 10 reps - Doorway Pec Stretch at 60 Elevation  - 1 x daily - 5 x  weekly - 1 sets - 3 reps - 15-30 sec hold - Seated Scapular Retraction  - 1 x daily - 5 x weekly - 1 sets - 10 reps - Step Sideways with Arms Reaching  - 1 x daily - 5 x weekly - 2 sets - 10 reps - Carioca with Counter Support  - 1 x daily - 5 x weekly - 2 sets - 10 reps   -------------------------------------------------------------------------------------------------------------------- (objective measures completed at initial evaluation unless otherwise dated)  COGNITION: Overall cognitive status:  Mild Cognitive Impairment-dx 12/2021             POSTURE: rounded shoulders, forward head, and flexed trunk    LOWER EXTREMITY ROM:   WFL for BLEs     LOWER EXTREMITY MMT:     MMT Right Eval Left Eval  Hip flexion 4/5 4/5  Hip extension      Hip abduction      Hip adduction      Hip internal rotation      Hip external rotation  Knee flexion 4/5 4+/5  Knee extension 4/5 4/5  Ankle dorsiflexion 4/5 4/5  Ankle plantarflexion      Ankle inversion      Ankle eversion      (Blank rows = not tested)     TRANSFERS: Assistive device utilized: None  Sit to stand: Complete Independence Stand to sit: Complete Independence     GAIT: Gait pattern: step through pattern, decreased step length- Right, decreased step length- Left, and trunk flexed Distance walked: 60 ft x 4 reps Assistive device utilized: None Level of assistance: Modified independence Comments: Pt does report occasional freezing episodes with gait, going up on toes upon initiating gait; not noted in eval today.   FUNCTIONAL TESTs:  5 times sit to stand: 8.29 sec with slight retropulsion.  Reports difficulty getting up from the sofa. Timed up and go (TUG): 8.65 sec TUG cognitive:10.69 sec TUG manual:  8.68 sec 61M gait velocity:  9.10 sec (3.6 ft/sec) MiniBESTest:  24/28 (see flow sheet for details)   [facility] PT Assessment - 03/05/22 0001                Standardized Balance Assessment    Standardized Balance  Assessment Mini-BESTest          Mini-BESTest    Sit To Stand Normal: Comes to stand without use of hands and stabilizes independently.     Rise to Toes Normal: Stable for 3 s with maximum height.     Stand on one leg (left) Moderate: < 20 s   6.04, 4.6    Stand on one leg (right) Moderate: < 20 s   1.78, 2.19    Stand on one leg - lowest score 1     Compensatory Stepping Correction - Forward Normal: Recovers independently with a single, large step (second realignement is allowed).     Compensatory Stepping Correction - Backward Normal: Recovers independently with a single, large step     Compensatory Stepping Correction - Left Lateral Normal: Recovers independently with 1 step (crossover or lateral OK)     Compensatory Stepping Correction - Right Lateral Normal: Recovers independently with 1 step (crossover or lateral OK)     Stepping Corredtion Lateral - lowest score 2     Stance - Feet together, eyes open, firm surface  Normal: 30s     Stance - Feet together, eyes closed, foam surface  Moderate: < 30s     Incline - Eyes Closed Normal: Stands independently 30s and aligns with gravity     Change in Gait Speed Normal: Significantly changes walkling speed without imbalance     Walk with head turns - Horizontal Moderate: performs head turns with reduction in gait speed.     Walk with pivot turns Normal: Turns with feet close FAST (< 3 steps) with good balance.     Step over obstacles Normal: Able to step over box with minimal change of gait speed and with good balance.     Timed UP & GO with Dual Task Moderate: Dual Task affects either counting OR walking (>10%) when compared to the TUG without Dual Task.     Mini-BEST total score 24                      03/18/22: M-CTSIB *tendency to lean posterior and R   Condition 1: Firm Surface, EO 30 Sec, Normal Sway  Condition 2: Firm Surface, EC 30 Sec, Mild Sway  Condition 3: Foam Surface, EO 30 Sec,  Mild Sway  Condition 4: Foam Surface, EC  30 Sec, Moderate Sway     PATIENT EDUCATION: Education details: PT eval results, POC Person educated: Patient Education method: Explanation Education comprehension: verbalized understanding     HOME EXERCISE PROGRAM: REcommendation to continue previous exercises from previous bout of therapy: MedBridge [email or web address] and standing PWR! Moves   -----------------------------------------------------------------------------------------------------------------------------    GOALS: Goals reviewed with patient? Yes   SHORT TERM GOALS: = LTGs   LONG TERM GOALS: Target date: 04/04/2022   Pt will be independent with HEP for improved strength, balance, transfers, and gait. Baseline:  Goal status: IN PROGRESS   2.  Pt will perform 8 of 10 reps of sit<>stand from <18" surface, without retropulsion or LOB, to demonstrate improved functional strength and transfer efficiency. Baseline:  Goal status: IN PROGRESS   3.  Pt will verbalize tips to reduce freezing/festination with gait and turns.  Baseline:  Goal status: IN PROGRESS   4.  Pt will verbalize plans for continued community fitness to maximize gains made in PT. Baseline:  Goal status: IN PROGRESS   ASSESSMENT:   CLINICAL IMPRESSION: Pt arrives today to PT reporting pain in low back since Monday-unsure of mechanism, but occurred after a full day of activity and assistance with a fundraising event.  Pain has been worse in mornings, and improves through the day; he does not have any c/o radiating pain or sharp pain.  Focused today's exercises on lumbar flexibility and gentle exercises to try to lessen pain.  Pt does report improved pain at end of session today, from 7/10>4/10.  Did not try PWR! Moves today due to back pain, but will try when able.   OBJECTIVE IMPAIRMENTS Abnormal gait, decreased balance, decreased mobility, difficulty walking, decreased strength, impaired flexibility, and postural dysfunction.    ACTIVITY LIMITATIONS  standing, transfers, and locomotion level   PARTICIPATION LIMITATIONS: community activity and participation in PD fitness class   PERSONAL FACTORS 3+ comorbidities:    Recent neurocognitive testing; MCI; cervical lymphadenopathy, dystonia, GERD, Meige syndrome, non-Hodgkin Lymphoma are also affecting patient's functional outcome.    REHAB POTENTIAL: Good   CLINICAL DECISION MAKING: Evolving/moderate complexity   EVALUATION COMPLEXITY: Moderate   PLAN: PT FREQUENCY: 2x/week   PT DURATION: 4 weeks plus eval week   PLANNED INTERVENTIONS: Therapeutic exercises, Therapeutic activity, Neuromuscular re-education, Balance training, Gait training, Patient/Family education, Joint mobilization, and Manual therapy   PLAN FOR NEXT SESSION: Check LTGs and discuss POC/possible discharge.  Try PWR! Moves supine and prone  if pt's back pain allows; discuss pt's return to [facility]! Moves classes.    Mady Haagensen, PT 04/02/22 5:22 PM Phone: [phone] Fax: [phone]   [facility] at [facility] [place], [place], [place] [address] Phone # [phone] Fax # [phone]

## 2022-04-02 NOTE — Therapy (Signed)
OUTPATIENT SPEECH LANGUAGE PATHOLOGY TREATMENT NOTE   Patient Name: [name] [name] MRN: [id number] DOB:20-Jul-1951, 71 y.o., male Today's Date: 04/02/2022  PCP: [name], DO REFERRING PROVIDER: Loralie Champagne, DO  END OF SESSION:   End of Session - 04/02/22 1544     Visit Number 5    Number of Visits 17    Date for SLP Re-Evaluation 05/02/22    SLP Start Time 1538    SLP Stop Time  1616    SLP Time Calculation (min) 38 min    Activity Tolerance Patient tolerated treatment well             Past Medical History:  Diagnosis Date   Cervical lymphadenopathy    right - followed by ENT   DOE (dyspnea on exertion) 07/15/2018   Dysphagia, neurologic 07/27/2014   Dystonia 1994   diagnosed in [place]   Enlarged lymph nodes 07/28/2007   Gastroesophageal reflux disease 03/07/2020   Localized swelling of right lower extremity 04/12/2019   Low back pain 11/21/2011   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   Mild neurocognitive disorder due to Parkinson's disease 01/10/2022   Non-Hodgkin lymphoma 2007   diffuse- 6 cycles of chemo with R CHOP Rituxan - last dose 10/08   Nonspecific elevation of levels of transaminase or lactic acid dehydrogenase (LDH) 07/06/2008   Parkinson's disease 07/27/2014   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   Past Surgical History:  Procedure Laterality Date   CHOLECYSTECTOMY, LAPAROSCOPIC     EYE SURGERY     x2 as child   PORT-A-CATH REMOVAL     PORTACATH PLACEMENT     SPLENECTOMY     TONSILLECTOMY     Patient Active Problem List   Diagnosis Date Noted   Mild neurocognitive disorder due to Parkinson's disease 01/10/2022   Gastroesophageal reflux disease 03/07/2020   Localized swelling of right lower extremity 04/12/2019   DOE (dyspnea on exertion) 07/15/2018   10 year risk of MI or stroke 7.5% or greater 04/12/2018   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   Meige syndrome (blepharospasm with oromandibular dystonia)  07/27/2014   Dysphagia, neurologic 07/27/2014   Parkinson's disease ([place]) 07/27/2014   Dystonia 07/10/2014   Low back pain 11/21/2011   Nonspecific elevation of levels of transaminase or lactic acid dehydrogenase (LDH) 07/06/2008   Enlarged lymph nodes 07/28/2007   Non-Hodgkin lymphoma 03/23/2007    ONSET DATE: Dx in mid-2010s  REFERRING DIAG: G20 - Parkinson's disease   THERAPY DIAG:  Dysarthria and anarthria  Rationale for Evaluation and Treatment Rehabilitation  SUBJECTIVE: "I am a little tired today."   PAIN:  Are you having pain? No   OBJECTIVE:   TODAY'S TREATMENT:  04/02/22:  SLP targeted increased vocal intensity this session. Pt reports that on average he completes 6 sustained /a/. Sustained /a/ (dB): averaged 92 dB.  -Functional sentences: 2 sets of 10 - First set (over the fence voice): 72 dB avg -Second set (authoritative): 71 dB avg with model   Completed a 4-min conversation about "the most favorite trip he's ever taken". Avg 60-65 dB  Pt reported he forgot to do his HEP and does not recall if he has the sentences at home. Provided with sentences in case he misplaced sentences at home. SLP also told pt he could read newspaper or whatever reading material he enjoys, sentence by sentence. Cont with current POC.  03/20/22: Louder speech was targeted this session with loud /a/ at average in low 90s, everyday sentences with average upper upper  70s - low 80s dB. With sentence level responses pt had average of mid 60s- upper 60s dB loudness. In multiple sentences, pt loudness dropped to low-mid 60s dB, even after encouragement to incr volume.  Pt stated he has been 100% with his meds since previous session. Homework for sentence level reading and responses; Instructed pt to work 15-30 minutes BID.  03/18/22: [name] agreed with STGs and LTGs when discussed with him. SLP and pt talked about how pt could make 7 days consecutive with WNL medication administration. He set  second alarms on his phone for 5 minutes after alarms set on his watch, in order to hear one of the two and then administer his own meds. Regarding "s" statement, [name] made 3 phone calls this morning without anyone asking him to repeat. Louder speech was targeted this session with loud /a/ at average in low 90s, everyday sentences with average upper 70s low 80s dB. Pt and SLP talked for approx 5 minutes about the need to complete loud /a/ and sentences BID and not QD. SLP targeted abdominal breathing (AB) with pt and he req'd min cues initially however was independent after approx 60 seconds, at rest.   03/12/22: SLP targeted increased vocal intensity this session. Pt reports that on average he completes 6 sustained /a/. Sustained /a/ (dB): 88, 86, 92, 95, 96, 92, 93, 95, 93, 89 dB. Pitch glides: x10. Functional sentences: 2 sets of 10 - First set (over the fence voice): WFL dB  -Second set (authoritative): ~65 dB; increased with verbal and visual cueing  PATIENT EDUCATION: Education details: see above in "today's treatment" Person educated: Patient Education method: Explanation and Handouts Education comprehension: verbalized understanding and downloaded app     HOME EXERCISE PROGRAM: See above in "today's treatment"     GOALS: Goals reviewed with patient? Yes   SHORT TERM GOALS: Target date: 04/01/2022     pt will produce loud /a/ with at least low 90s dB average over three sessions Baseline: 03/18/22, 03/20/22 Goal status: Ongoing   2.  Pt will produce 16/20 sentence responses with WNL volume over two sessions Baseline:  Goal status: Ongoing   3.  Pt will produce 5 minutes simple conversation with volume upper 60s dB average over three sessions Baseline:  Goal status: Ongoing   4.  pt will generate abdominal breathing 80% of the time when engaging in 5 minutes simple conversation in 2 sessions Baseline:  Goal status: Ongoing   5.  Pt will indicate 100% successful medication  administration for 7 days using strategies Baseline:  Goal status: Ongoing     LONG TERM GOALS: Target date: 05/02/2022     Pt will score higher than 21/32 on CES in the final two ST sessions Baseline:  Goal status: Ongoing   2.  Pt will demo successful usage of anomia compensations PRN in 10 minutes mod complex conversation in 2 sessions Baseline:  Goal status: Ongoing   3.  Pt will maintain average of upper 60s dB over 10 minute simple-mod complex conversation with rare min A in three sessions  Baseline:  Goal status: Ongoing   4.  pt will use abdominal breathing 75% of the time in 10 minutes simple-mod complex conversation in three sessions  Baseline:  Goal status: Ongoing   5.  Pt will indicate 100% successful medication administration after 04/01/21 for 14 days, using strategies  Baseline:  Goal status: Ongoing     ASSESSMENT:   CLINICAL IMPRESSION: Patient is a 71 y.o.  male who was seen today for assessment of speech clarity who demonstrated mod dysarthria and reduced breath support due to Parkinson's disease. See tx note. Cont with current POC. Pt would benefit from skilled ST targeting loudness in conversation, anomia strategies, and developing simple memory strategy for med administration due to occasional accidental skipping of meds due to swiping or stopping alarms without taking medication. Pt's OT will target attention.    OBJECTIVE IMPAIRMENTS  Objective impairments include memory, aphasia, and dysarthria. These impairments are limiting patient from managing medications, household responsibilities, and effectively communicating at home and in community.Factors affecting potential to achieve goals and functional outcome are previous level of function and severity of impairments.. Patient will benefit from skilled SLP services to address above impairments and improve overall function.   REHAB POTENTIAL: Good   PLAN: SLP FREQUENCY: 2x/week   SLP DURATION: 8 weeks    PLANNED INTERVENTIONS: Language facilitation, Environmental controls, Cueing hierachy, Internal/external aids, Functional tasks, Multimodal communication approach, SLP instruction and feedback, and Compensatory strategies   Verdene Lennert, CCC-SLP 04/02/2022, 3:46 PM

## 2022-04-03 ENCOUNTER — Ambulatory Visit: Payer: Medicare Other | Admitting: Physical Therapy

## 2022-04-03 ENCOUNTER — Ambulatory Visit: Payer: Medicare Other | Admitting: Occupational Therapy

## 2022-04-03 ENCOUNTER — Encounter: Payer: Self-pay | Admitting: Physical Therapy

## 2022-04-03 DIAGNOSIS — R278 Other lack of coordination: Secondary | ICD-10-CM

## 2022-04-03 DIAGNOSIS — R29818 Other symptoms and signs involving the nervous system: Secondary | ICD-10-CM | POA: Diagnosis not present

## 2022-04-03 DIAGNOSIS — R2689 Other abnormalities of gait and mobility: Secondary | ICD-10-CM

## 2022-04-03 DIAGNOSIS — R293 Abnormal posture: Secondary | ICD-10-CM

## 2022-04-03 DIAGNOSIS — R2681 Unsteadiness on feet: Secondary | ICD-10-CM | POA: Diagnosis not present

## 2022-04-03 DIAGNOSIS — R4701 Aphasia: Secondary | ICD-10-CM | POA: Diagnosis not present

## 2022-04-03 DIAGNOSIS — R471 Dysarthria and anarthria: Secondary | ICD-10-CM | POA: Diagnosis not present

## 2022-04-03 NOTE — Therapy (Signed)
OUTPATIENT OCCUPATIONAL THERAPY  Treatment Session  Patient Name: [name] [name] MRN: [id number] DOB:05/09/1951, 71 y.o., male Today's Date: 04/03/2022  PCP: [name], DO REFERRING PROVIDER: Ludwig Clarks, DO    OT End of Session - 04/03/22 1555     Visit Number 3    Number of Visits 9    Date for OT Re-Evaluation 05/02/22    Authorization Type Medicare A & B    OT Start Time 1452    OT Stop Time 1532    OT Time Calculation (min) 40 min    Activity Tolerance Patient tolerated treatment well    Behavior During Therapy [facility] for tasks assessed/performed               Past Medical History:  Diagnosis Date   Cervical lymphadenopathy    right - followed by ENT   DOE (dyspnea on exertion) 07/15/2018   Dysphagia, neurologic 07/27/2014   Dystonia 1994   diagnosed in [place]   Enlarged lymph nodes 07/28/2007   Gastroesophageal reflux disease 03/07/2020   Localized swelling of right lower extremity 04/12/2019   Low back pain 11/21/2011   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   Mild neurocognitive disorder due to Parkinson's disease 01/10/2022   Non-Hodgkin lymphoma 2007   diffuse- 6 cycles of chemo with R CHOP Rituxan - last dose 10/08   Nonspecific elevation of levels of transaminase or lactic acid dehydrogenase (LDH) 07/06/2008   Parkinson's disease 07/27/2014   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   Past Surgical History:  Procedure Laterality Date   CHOLECYSTECTOMY, LAPAROSCOPIC     EYE SURGERY     x2 as child   PORT-A-CATH REMOVAL     PORTACATH PLACEMENT     SPLENECTOMY     TONSILLECTOMY     Patient Active Problem List   Diagnosis Date Noted   Mild neurocognitive disorder due to Parkinson's disease 01/10/2022   Gastroesophageal reflux disease 03/07/2020   Localized swelling of right lower extremity 04/12/2019   DOE (dyspnea on exertion) 07/15/2018   10 year risk of MI or stroke 7.5% or greater 04/12/2018    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   Dysphagia, neurologic 07/27/2014   Parkinson's disease ([place]) 07/27/2014   Dystonia 07/10/2014   Low back pain 11/21/2011   Nonspecific elevation of levels of transaminase or lactic acid dehydrogenase (LDH) 07/06/2008   Enlarged lymph nodes 07/28/2007   Non-Hodgkin lymphoma 03/23/2007    ONSET DATE: referral date 01/30/22 (diagnosed with PD in 2015)  REFERRING DIAG: G20 (ICD-10-CM) - Parkinson's disease   THERAPY DIAG:  Other symptoms and signs involving the nervous system  Other lack of coordination  Abnormal posture  Unsteadiness on feet  Rationale for Evaluation and Treatment Rehabilitation  SUBJECTIVE:   SUBJECTIVE STATEMENT: Pt reports attempting to get in to wife's van by backing up, but due to height of seat was unable to get in with this technique. Pt accompanied by: self  PERTINENT HISTORY: PD, Chronic pain R shoulder; PISA syndrome   PAIN:  Are you having pain? Yes: NPRS scale: 2/10 Pain location: back Pain description: dull, nagging Aggravating factors: increased movement Relieving factors: rest   PATIENT GOALS a tune up to maintain progress made during previous OT sessions  OBJECTIVE:   TODAY'S TREATMENT:  Problem solving getting in and out of van.  Reports improvements when wife would back van into driveway where there is a higher spot that he can get in easier (however  still requiring large step when stepping up in to [place]).  Therapist still recommends they bring Lucianne Lei to future appointment to further problem solve. Handwriting: Pt wrote some sample notes for work which were 100% illegible.  Therapist discussed pacing self, utilizing allotted spacing for increased size of letters, slower speed, and recommendation of typing to increase legibility.   Engaged in peg board pattern replication with focus on alternating attention during visual scanning task to  include matching key at bottom.  Pt utilizing finger for tracking to increase attention and sequencing during table top task.  Therapist providing initial demonstration cue fading to no cues or assist.  Pt utilizing vertical scanning technique for increased sequencing and problem solving.     PATIENT EDUCATION: Education details: ongoing condition specific education Person educated: Patient Education method: Explanation Education comprehension: verbalized understanding   HOME EXERCISE PROGRAM: TBD    GOALS: Goals reviewed with patient? Yes   LONG TERM GOALS: Target date: 05/02/22 (note: only LTGs due to plan to begin OT in about 3-4 weeks to allow pt to focus treatment on PT and SLP then phase in OT as PT phases out)  STG/ LTG  Status:  1 Pt will be Independent with PD specific HEP Baseline:  Progressing  2 Pt will verbalize understanding of adapted strategies and AE to maximize safety and I with ADLs/ IADLs  Baseline:  Progressing  3 Pt will demonstrate improved UE functional use for ADLs as evidenced by increasing box/ blocks score by 3 blocks with [name]/LUE Baseline: R: 44 and L: 41 (however hitting barrier multiple times on R) Progressing  4 Pt will demonstrate improved alternating attention by completing  table top scanning activity with Supervision Baseline:  Progressing  5 Pt will demonstrate understanding of memory compensations and ways to keep thinking skills sharp. Baseline: alarm goes off for his med and he snoozes or stops it and doesn't take his med. Progressing    ASSESSMENT:  CLINICAL IMPRESSION: Treatment session with focus on fine motor control and alternating attention during table top tasks.  Pt reports noticing significant improvements in LB dressing with use of figure 4 position in sitting.  Pt reports problem solving with getting in/out of wife's adaptive Lucianne Lei, reporting mild improvements when getting in from driveway due to angle vs in garage.  Still  recommend pt bring Lucianne Lei to therapy session if still having difficulty and questions.  Pt reports understanding of recommendations for handwriting, however continues to report poor handwriting.  PERFORMANCE DEFICITS in functional skills including ADLs, IADLs, coordination, dexterity, tone, ROM, strength, pain, flexibility, FMC, GMC, mobility, balance, body mechanics, endurance, decreased knowledge of precautions, decreased knowledge of use of DME, and UE functional use, cognitive skills including attention, memory, problem solving, and safety awareness, and psychosocial skills including environmental adaptation.   IMPAIRMENTS are limiting patient from ADLs, IADLs, leisure, and social participation.   COMORBIDITIES may have co-morbidities  that affects occupational performance. Patient will benefit from skilled OT to address above impairments and improve overall function.  MODIFICATION OR ASSISTANCE TO COMPLETE EVALUATION: No modification of tasks or assist necessary to complete an evaluation.  OT OCCUPATIONAL PROFILE AND HISTORY: Detailed assessment: Review of records and additional review of physical, cognitive, psychosocial history related to current functional performance.  CLINICAL DECISION MAKING: LOW - limited treatment options, no task modification necessary  REHAB POTENTIAL: Good  EVALUATION COMPLEXITY: Low    PLAN: OT FREQUENCY: 2x/week  OT DURATION: 8 weeks  PLANNED INTERVENTIONS: self care/ADL training, therapeutic exercise, therapeutic  activity, neuromuscular re-education, balance training, functional mobility training, ultrasound, moist heat, cryotherapy, patient/family education, cognitive remediation/compensation, psychosocial skills training, energy conservation, coping strategies training, and DME and/or AE instructions  RECOMMENDED OTHER SERVICES: NA  CONSULTED AND AGREED WITH PLAN OF CARE: Patient  PLAN FOR NEXT SESSION: Initiate large amplitude exercises.  Review LB  dressing, car transfers, handwriting, and focus on attention    Hasty, [place], OTR/L 04/03/2022, 3:56 PM

## 2022-04-03 NOTE — Therapy (Signed)
OUTPATIENT PHYSICAL THERAPY TREATMENT NOTE   Patient Name: [name] [name] MRN: [id number] DOB:Jan 29, 1951, 71 y.o., male Today's Date: 04/04/2022  PCP: [name] DO REFERRING PROVIDER: Tat, Eustace Quail, DO  PHYSICAL THERAPY DISCHARGE SUMMARY  Visits from Start of Care: 7  Current functional level related to goals / functional outcomes: See below for goals   Remaining deficits: Posture, bradykinesia, back pain at times   Education / Equipment: Educated in [employer], community fitness, tips to reduce freezing/festination   Patient agrees to discharge. Patient goals were met. Patient is being discharged due to meeting the stated rehab goals.  Recommend return PT eval in 6-9 months.  Mady Haagensen, PT 04/04/22 12:30 PM Phone: [phone] Fax: [phone]    END OF SESSION:   PT End of Session - 04/03/22 1539     Visit Number 7    Number of Visits 9    Date for PT Re-Evaluation 04/04/22    Authorization Type Medicare/BCBS    PT Start Time 1536    PT Stop Time 1615    PT Time Calculation (min) 39 min    Activity Tolerance Patient tolerated treatment well   Initial pain 7/10, improved to 4/10 at end of session   Behavior During Therapy [facility] for tasks assessed/performed                Past Medical History:  Diagnosis Date   Cervical lymphadenopathy    right - followed by ENT   DOE (dyspnea on exertion) 07/15/2018   Dysphagia, neurologic 07/27/2014   Dystonia 1994   diagnosed in [place]   Enlarged lymph nodes 07/28/2007   Gastroesophageal reflux disease 03/07/2020   Localized swelling of right lower extremity 04/12/2019   Low back pain 11/21/2011   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   Mild neurocognitive disorder due to Parkinson's disease 01/10/2022   Non-Hodgkin lymphoma 2007   diffuse- 6 cycles of chemo with R CHOP Rituxan - last dose 10/08   Nonspecific elevation of levels of transaminase or lactic acid  dehydrogenase (LDH) 07/06/2008   Parkinson's disease 07/27/2014   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   Past Surgical History:  Procedure Laterality Date   CHOLECYSTECTOMY, LAPAROSCOPIC     EYE SURGERY     x2 as child   PORT-A-CATH REMOVAL     PORTACATH PLACEMENT     SPLENECTOMY     TONSILLECTOMY     Patient Active Problem List   Diagnosis Date Noted   Mild neurocognitive disorder due to Parkinson's disease 01/10/2022   Gastroesophageal reflux disease 03/07/2020   Localized swelling of right lower extremity 04/12/2019   DOE (dyspnea on exertion) 07/15/2018   10 year risk of MI or stroke 7.5% or greater 04/12/2018   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   Dysphagia, neurologic 07/27/2014   Parkinson's disease ([place]) 07/27/2014   Dystonia 07/10/2014   Low back pain 11/21/2011   Nonspecific elevation of levels of transaminase or lactic acid dehydrogenase (LDH) 07/06/2008   Enlarged lymph nodes 07/28/2007   Non-Hodgkin lymphoma 03/23/2007    REFERRING DIAG: Parkinson's disease   THERAPY DIAG:  Abnormal posture  Unsteadiness on feet  Other abnormalities of gait and mobility  Rationale for Evaluation and Treatment Rehabilitation  PERTINENT HISTORY: See above  PRECAUTIONS: Fall risk  SUBJECTIVE: Back feels better right now, but was 6/10 this morning  PAIN:  Are you having pain? Yes: NPRS scale: 0/10 Pain location: R low back  Pain description: dull, stiff  Aggravating factors: worse pain in the mornings Relieving factors: moving through the day *Reports pain at 1-2/10 at end of session  OBJECTIVE:   Neuro Re-education:    Pt performs PWR! Moves in supine position x 10 reps   PWR! Up for improved posture-through shoulders x 10, through hips x 10  PWR! Rock for improved weighshifting  PWR! Twist for improved trunk rotation   PWR! Step for improved step initiation    Pt performs PWR! Moves in prone  position x 5 reps   PWR! Up for improved posture  PWR! Rock for improved weighshifting  PWR! Twist for improved trunk rotation   PWR! Step for improved step initiation   Cues provided for technique to avoid further back pain.  Pt tends to perform in quick manner, cues for slowed pace, if able.  TUG:  11.78 sec 5x sit<>stand:  11.56 sec    Gait 60 ft x 4 reps, then 40 ft x 4 reps with attention to increased step length, foot clearance, arm swing and for review of "shift and lift" for improved tight space turns.  PATIENT EDUCATION: Education details: Progress towards goals, verbal review of HEP, reviewed optimal fitness routine and educated patient in return to [facility]! Moves classes on 7/26; discussed plans for d/c and return eval in 6-9 months. Person educated: Patient Education method: Explanation Education comprehension: verbalized understanding   Access Code: [email or web address] (NEW HEP additions to address low back pain this visit) URL: [email or web address] Date: 04/02/2022 Prepared by: [facility]  Exercises - Supine Pelvic Tilt  - 1-2 x daily - 7 x weekly - 2 sets - 5 reps - Hooklying Single Knee to Chest Stretch  - 2-3 x daily - 7 x weekly - 1 sets - 5 reps - 5-10 sec hold - Lower Trunk Rotations  - 1-2 x daily - 7 x weekly - 1 sets - 5-10 reps  HOME EXERCISE PROGRAM Last updated: 03/20/22 Access Code: [email or web address] URL: [email or web address] Date: 03/20/2022 Prepared by: [facility]  Program Notes To help decrease freezing episodes later in the evening:In sitting:  -March in place x 5-10 reps with emphasis on stomping.-Kick leg forward 5-10 reps, alternating legs, with emphasis on Big kickIn standing:-March in place x 5-10 reps with emphasis on stomping to lift feet -BIG step to start walkingFour square step activity/Box step:Step forward, Right, Back, then Left, with BIG step for foot  clearance each direction (clockwise); then reverse to step in counter clockwise direction.  Repeat 3 times each direction  Exercises - Circular Shoulder Pendulum with Table Support  - 1 x daily - 7 x weekly - 1 sets - 10 reps - Doorway Pec Stretch at 60 Elevation  - 1 x daily - 5 x weekly - 1 sets - 3 reps - 15-30 sec hold - Seated Scapular Retraction  - 1 x daily - 5 x weekly - 1 sets - 10 reps - Step Sideways with Arms Reaching  - 1 x daily - 5 x weekly - 2 sets - 10 reps - [place] with Counter Support  - 1 x daily - 5 x weekly - 2 sets - 10 reps   -------------------------------------------------------------------------------------------------------------------- (objective measures completed at initial evaluation unless otherwise dated)  COGNITION: Overall cognitive status:  Mild Cognitive Impairment-dx 12/2021             POSTURE: rounded shoulders, forward head, and flexed trunk    LOWER EXTREMITY  ROM:   WFL for BLEs     LOWER EXTREMITY MMT:     MMT Right Eval Left Eval  Hip flexion 4/5 4/5  Hip extension      Hip abduction      Hip adduction      Hip internal rotation      Hip external rotation      Knee flexion 4/5 4+/5  Knee extension 4/5 4/5  Ankle dorsiflexion 4/5 4/5  Ankle plantarflexion      Ankle inversion      Ankle eversion      (Blank rows = not tested)     TRANSFERS: Assistive device utilized: None  Sit to stand: Complete Independence Stand to sit: Complete Independence     GAIT: Gait pattern: step through pattern, decreased step length- Right, decreased step length- Left, and trunk flexed Distance walked: 60 ft x 4 reps Assistive device utilized: None Level of assistance: Modified independence Comments: Pt does report occasional freezing episodes with gait, going up on toes upon initiating gait; not noted in eval today.   FUNCTIONAL TESTs:  5 times sit to stand: 8.29 sec with slight retropulsion.  Reports difficulty getting up from the  sofa. Timed up and go (TUG): 8.65 sec TUG cognitive:10.69 sec TUG manual:  8.68 sec 54M gait velocity:  9.10 sec (3.6 ft/sec) MiniBESTest:  24/28 (see flow sheet for details)   [facility] PT Assessment - 03/05/22 0001                Standardized Balance Assessment    Standardized Balance Assessment Mini-BESTest          Mini-BESTest    Sit To Stand Normal: Comes to stand without use of hands and stabilizes independently.     Rise to Toes Normal: Stable for 3 s with maximum height.     Stand on one leg (left) Moderate: < 20 s   6.04, 4.6    Stand on one leg (right) Moderate: < 20 s   1.78, 2.19    Stand on one leg - lowest score 1     Compensatory Stepping Correction - Forward Normal: Recovers independently with a single, large step (second realignement is allowed).     Compensatory Stepping Correction - Backward Normal: Recovers independently with a single, large step     Compensatory Stepping Correction - Left Lateral Normal: Recovers independently with 1 step (crossover or lateral OK)     Compensatory Stepping Correction - Right Lateral Normal: Recovers independently with 1 step (crossover or lateral OK)     Stepping Corredtion Lateral - lowest score 2     Stance - Feet together, eyes open, firm surface  Normal: 30s     Stance - Feet together, eyes closed, foam surface  Moderate: < 30s     Incline - Eyes Closed Normal: Stands independently 30s and aligns with gravity     Change in Gait Speed Normal: Significantly changes walkling speed without imbalance     Walk with head turns - Horizontal Moderate: performs head turns with reduction in gait speed.     Walk with pivot turns Normal: Turns with feet close FAST (< 3 steps) with good balance.     Step over obstacles Normal: Able to step over box with minimal change of gait speed and with good balance.     Timed UP & GO with Dual Task Moderate: Dual Task affects either counting OR walking (>10%) when compared to the TUG without Dual Task.  Mini-BEST total score 24                      03/18/22: M-CTSIB *tendency to lean posterior and R   Condition 1: Firm Surface, EO 30 Sec, Normal Sway  Condition 2: Firm Surface, EC 30 Sec, Mild Sway  Condition 3: Foam Surface, EO 30 Sec, Mild Sway  Condition 4: Foam Surface, EC 30 Sec, Moderate Sway     PATIENT EDUCATION: Education details: PT eval results, POC Person educated: Patient Education method: Explanation Education comprehension: verbalized understanding     HOME EXERCISE PROGRAM: REcommendation to continue previous exercises from previous bout of therapy: [employer] [email or web address] and standing PWR! Moves   -----------------------------------------------------------------------------------------------------------------------------    GOALS: Goals reviewed with patient? Yes   SHORT TERM GOALS: = LTGs   LONG TERM GOALS: Target date: 04/04/2022   Pt will be independent with HEP for improved strength, balance, transfers, and gait. Baseline:  Goal status: GOAL MET, 04/03/2022   2.  Pt will perform 8 of 10 reps of sit<>stand from <18" surface, without retropulsion or LOB, to demonstrate improved functional strength and transfer efficiency. Baseline:  Goal status: GOAL MET 04/03/2022   3.  Pt will verbalize tips to reduce freezing/festination with gait and turns.  Baseline:  Goal status: GOAL MET 04/03/2022   4.  Pt will verbalize plans for continued community fitness to maximize gains made in PT. Baseline:  Goal status: GOAL MET 04/03/2022   ASSESSMENT:   CLINICAL IMPRESSION: Pt arrives today with less reports of back pain compared to last visit. He trials remaining PWR! Moves positions (supine and prone) with very little discomfort in his back.  He is able to perform these positions, but is limited to 5 reps in prone position.  Discussed optimal fitness and anticipated return to community PWR! Moves class to help reinforce exercises he is doing at home; also discussed  some of the modifications to positions in those classes if needed.  He has met all 4 of 4 goals and is appropriate for d/c at this time.   OBJECTIVE IMPAIRMENTS Abnormal gait, decreased balance, decreased mobility, difficulty walking, decreased strength, impaired flexibility, and postural dysfunction.    ACTIVITY LIMITATIONS standing, transfers, and locomotion level   PARTICIPATION LIMITATIONS: community activity and participation in PD fitness class   PERSONAL FACTORS 3+ comorbidities:    Recent neurocognitive testing; MCI; cervical lymphadenopathy, dystonia, GERD, Meige syndrome, non-Hodgkin Lymphoma are also affecting patient's functional outcome.    REHAB POTENTIAL: Good   CLINICAL DECISION MAKING: Evolving/moderate complexity   EVALUATION COMPLEXITY: Moderate   PLAN: PT FREQUENCY: 2x/week   PT DURATION: 4 weeks plus eval week   PLANNED INTERVENTIONS: Therapeutic exercises, Therapeutic activity, Neuromuscular re-education, Balance training, Gait training, Patient/Family education, Joint mobilization, and Manual therapy   PLAN FOR NEXT SESSION: D/C this visit, with plan for return eval in 6-9 months.  Mady Haagensen, PT 04/04/22 12:25 PM Phone: [phone] Fax: [phone]   [facility] at [facility] [place], [place], [place] [address] Phone # [phone] Fax # [phone]

## 2022-04-07 NOTE — Progress Notes (Signed)
Assessment/Plan:   1.  Parkinsons Disease  -Continue carbidopa/levodopa 25/100, 1 tablet at 7 AM/11 AM/3 PM/7 PM  - continue carbidopa/levodopa 50/200 CR q hs  -Continue pramipexole, 0.75 mg 3 times per day  -We discussed that it used to be thought that levodopa would increase risk of melanoma but now it is believed that Parkinsons itself likely increases risk of melanoma. he is to get regular skin checks.   2.  Meige syndrome  -Stable  -no tx needed right now  3.  Insomnia/mild RBD  -Continue clonazepam, 0.5 mg, 1/2 tablet at night.  Not taking faithfully.  Discussed to get back on schedule.  Higher dosages didn't help and had hang over effect  4.  Memory change  -Patient saw Dr. Milbert Coulter January 10, 2022 with evidence of MCI.    Subjective:   [name] [name] was seen today in follow up for Parkinsons disease.  My previous records were reviewed prior to todays visit as well as outside records available to me. Wife with patient and supplements history.  Patient had neurocognitive testing since our last visit.  MCI was the diagnosis.  Records are reviewed.  He has also been to therapies, PT/ST/OT since last visit and those have been reviewed.  Last visit, we started bedtime extended release levodopa to see if that would help with first morning on.  He reports that this is helping.  He is walking and using the bike.    Current prescribed movement disorder medications: Pramipexole, 0.75 mg 3 times daily Carbidopa/levodopa 25/100, 1 tablet at 7 AM/11 AM/3 PM/7 PM  Carbidopa/levodopa 50/200 CR at bedtime (started last visit to see if it helped with first morning on) clonazepam, 0.5 mg, 1/2 tablet at bedtime    ALLERGIES:  No Known Allergies  CURRENT MEDICATIONS:  Outpatient Encounter Medications as of 04/08/2022  Medication Sig   aspirin 81 MG chewable tablet Chew 81 mg by mouth daily.   carbidopa-levodopa (SINEMET CR) 50-200 MG tablet Take 1 tablet by mouth at bedtime.    carbidopa-levodopa (SINEMET IR) 25-100 MG tablet Take 1 tablet by mouth 4 (four) times daily. 7am/11am/3pm/7pm   Cholecalciferol (VITAMIN D3) 3000 UNITS TABS Take 1,000 Units by mouth daily.    clonazePAM (KLONOPIN) 0.5 MG tablet TAKE 0.5 TABLETS (0.25 MG TOTAL) BY MOUTH AT BEDTIME.   COVID-19 mRNA bivalent vaccine, Pfizer, (PFIZER COVID-19 VAC BIVALENT) injection Inject into the muscle.   cyanocobalamin 100 MCG tablet Take 1,000 mcg by mouth daily.    famotidine (PEPCID) 10 MG tablet Take 10 mg by mouth 2 (two) times daily.   Melatonin 3 MG TABS Take 3 mg by mouth at bedtime.    pramipexole (MIRAPEX) 0.75 MG tablet TAKE 1 TABLET (0.75 MG TOTAL) BY MOUTH 3 (THREE) TIMES DAILY.   No facility-administered encounter medications on file as of 04/08/2022.    Objective:   PHYSICAL EXAMINATION:    VITALS:   Vitals:   04/08/22 0839  BP: 123/75  Pulse: 89  SpO2: 95%  Weight: 211 lb (95.7 kg)  Height: 5\' 6"  (1.676 m)       GEN:  The patient appears stated age and is in NAD. HEENT:  Normocephalic, atraumatic.  The mucous membranes are moist.    Neurological examination:  Orientation: The patient is alert and oriented x3. Cranial nerves: There is good facial symmetry with facial hypomimia. The speech is fluent and pseudobulbar. Soft palate rises symmetrically and there is no tongue deviation. Hearing is intact to conversational  tone. Sensation: Sensation is intact to light touch throughout Motor: Strength is at least antigravity x4.  Movement examination: Tone: There is nl tone in the ue/le Abnormal movements: none Coordination:  There is mild decremation with toe taps on the L Gait and Station: The patient has no difficulty arising out of a deep-seated chair without the use of the hands. The patient's stride length is slightly decreased with pisa syndrome to the R.  He is walking better with better speed than previous.    I have reviewed and interpreted the following labs  independently    Chemistry      Component Value Date/Time   NA 140 08/14/2021 0906   K 4.3 08/14/2021 0906   CL 104 08/14/2021 0906   CO2 29 08/14/2021 0906   BUN 15 08/14/2021 0906   CREATININE 1.13 08/14/2021 0906   CREATININE 0.94 09/26/2014 0925      Component Value Date/Time   CALCIUM 9.1 08/14/2021 0906   ALKPHOS 53 08/14/2021 0906   AST 26 08/14/2021 0906   ALT 13 08/14/2021 0906   BILITOT 0.7 08/14/2021 0906       Lab Results  Component Value Date   WBC 9.1 04/12/2018   HGB 15.5 04/12/2018   HCT 45.4 04/12/2018   MCV 98.0 04/12/2018   PLT 307.0 04/12/2018    Lab Results  Component Value Date   TSH 1.67 03/27/2015     Total time spent on today's visit was 31  minutes, including both face-to-face time and nonface-to-face time.  Time included that spent on review of records (prior notes available to me/labs/imaging if pertinent), discussing treatment and goals, answering patient's questions and coordinating care.  Cc:  Sharlene Dory, DO

## 2022-04-07 NOTE — Therapy (Signed)
OUTPATIENT SPEECH LANGUAGE PATHOLOGY TREATMENT NOTE   Patient Name: [name] [name] MRN: [id number] DOB:12/09/1950, 71 y.o., male Today's Date: 04/08/2022  PCP: [name] Pal, DO REFERRING PROVIDER: Loralie Champagne, DO  END OF SESSION:   End of Session - 04/08/22 1540     Visit Number 6    Number of Visits 17    Date for SLP Re-Evaluation 05/02/22    SLP Start Time 1537    SLP Stop Time  1615    SLP Time Calculation (min) 38 min    Activity Tolerance Patient tolerated treatment well              Past Medical History:  Diagnosis Date   Cervical lymphadenopathy    right - followed by ENT   DOE (dyspnea on exertion) 07/15/2018   Dysphagia, neurologic 07/27/2014   Dystonia 1994   diagnosed in [place]   Enlarged lymph nodes 07/28/2007   Gastroesophageal reflux disease 03/07/2020   Localized swelling of right lower extremity 04/12/2019   Low back pain 11/21/2011   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   Mild neurocognitive disorder due to Parkinson's disease 01/10/2022   Non-Hodgkin lymphoma 2007   diffuse- 6 cycles of chemo with R CHOP Rituxan - last dose 10/08   Nonspecific elevation of levels of transaminase or lactic acid dehydrogenase (LDH) 07/06/2008   Parkinson's disease 07/27/2014   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   Past Surgical History:  Procedure Laterality Date   CHOLECYSTECTOMY, LAPAROSCOPIC     EYE SURGERY     x2 as child   PORT-A-CATH REMOVAL     PORTACATH PLACEMENT     SPLENECTOMY     TONSILLECTOMY     Patient Active Problem List   Diagnosis Date Noted   Mild neurocognitive disorder due to Parkinson's disease 01/10/2022   Gastroesophageal reflux disease 03/07/2020   Localized swelling of right lower extremity 04/12/2019   DOE (dyspnea on exertion) 07/15/2018   10 year risk of MI or stroke 7.5% or greater 04/12/2018   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   Meige syndrome (blepharospasm with oromandibular dystonia)  07/27/2014   Dysphagia, neurologic 07/27/2014   Parkinson's disease ([place]) 07/27/2014   Dystonia 07/10/2014   Low back pain 11/21/2011   Nonspecific elevation of levels of transaminase or lactic acid dehydrogenase (LDH) 07/06/2008   Enlarged lymph nodes 07/28/2007   Non-Hodgkin lymphoma 03/23/2007    ONSET DATE: Dx in mid-2010s  REFERRING DIAG: G20 - Parkinson's disease   THERAPY DIAG:  Dysarthria and anarthria  Cognitive communication deficit  Aphasia  Rationale for Evaluation and Treatment Rehabilitation  SUBJECTIVE: "You are correct (about not practicing since before last session) but I've been better since then."  "I'm going to impress everyone at home with my new loudness." (After SLP repeated to pt that he has to feel like he's shouting)  PAIN:  Are you having pain? No   OBJECTIVE:   TODAY'S TREATMENT:  04/08/22: Increased speech volume targeted today with loud /a/ (average lower 90s), and everyday sentences (average low-mid 80s dB). "I put them into my phone" pt stated today to SLP, so that he would not misplace them again. SLP asked pt about the four areas where he would like to see improvement with voice volume (Bible study, ordering at restaurants, at ball game, and at [employer]). Pt usually gets broiled salmon, mashed potatoes, and broccoli at [employer] where he usually experiences difficulty with volume when ordering. SLP worked on this phrase with pt specifically today. SLP had pt  repeat his usual order x2, with WNL volume (mid 70s dB). SLP then played cafeteria noise and role played with pt and pt intelligible 100%, at upper 60s - low 70s dB. Also simulated/role played with pt with ordering at drive thru; pt at upper 60s dB average. AFter SLP explained pt will need to think "shout" in order to "overshoot" the target to achieve WNL volume, pt hit lower 70s average x2.   04/02/22: SLP targeted increased vocal intensity this session. Pt reports that on average he completes  6 sustained /a/. Sustained /a/ (dB): averaged 92 dB.  -Functional sentences: 2 sets of 10 - First set (over the fence voice): 72 dB avg -Second set (authoritative): 71 dB avg with model   Completed a 4-min conversation about "the most favorite trip he's ever taken". Avg 60-65 dB  Pt reported he forgot to do his HEP and does not recall if he has the sentences at home. Provided with sentences in case he misplaced sentences at home. SLP also told pt he could read newspaper or whatever reading material he enjoys, sentence by sentence. Cont with current POC.  03/20/22: Louder speech was targeted this session with loud /a/ at average in low 90s, everyday sentences with average upper upper 70s - low 80s dB. With sentence level responses pt had average of mid 60s- upper 60s dB loudness. In multiple sentences, pt loudness dropped to low-mid 60s dB, even after encouragement to incr volume.  Pt stated he has been 100% with his meds since previous session. Homework for sentence level reading and responses; Instructed pt to work 15-30 minutes BID.  03/18/22: [name] agreed with STGs and LTGs when discussed with him. SLP and pt talked about how pt could make 7 days consecutive with WNL medication administration. He set second alarms on his phone for 5 minutes after alarms set on his watch, in order to hear one of the two and then administer his own meds. Regarding "s" statement, [name] made 3 phone calls this morning without anyone asking him to repeat. Louder speech was targeted this session with loud /a/ at average in low 90s, everyday sentences with average upper 70s low 80s dB. Pt and SLP talked for approx 5 minutes about the need to complete loud /a/ and sentences BID and not QD. SLP targeted abdominal breathing (AB) with pt and he req'd min cues initially however was independent after approx 60 seconds, at rest.    PATIENT EDUCATION: Education details: see above in "today's treatment" Person educated:  Patient Education method: Explanation and Handouts Education comprehension: verbalized understanding and downloaded app     HOME EXERCISE PROGRAM: See above in "today's treatment"     GOALS: Goals reviewed with patient? Yes   SHORT TERM GOALS: Target date: 04/01/2022     pt will produce loud /a/ with at least low 90s dB average over three sessions Baseline: 03/18/22, 03/20/22 Goal status: met   2.  Pt will produce 16/20 sentence responses with WNL volume over two sessions Baseline:  Goal status: Not met   3.  Pt will produce 5 minutes simple conversation with volume upper 60s dB average over three sessions Baseline:  Goal status: Not met   4.  pt will generate abdominal breathing 80% of the time when engaging in 5 minutes simple conversation in 2 sessions Baseline:  Goal status: Not met   5.  Pt will indicate 100% successful medication administration for 7 days using strategies Baseline:  Goal status: Met  LONG TERM GOALS: Target date: 05/02/2022     Pt will score higher than 21/32 on CES in the final two ST sessions Baseline:  Goal status: Ongoing   2.  Pt will demo successful usage of anomia compensations PRN in 8 minutes mod complex conversation in 2 sessions Baseline:  Goal status: Revised   3.  Pt will maintain average of upper 60s dB over 8 minute simple-mod complex conversation with rare min A in three sessions  Baseline:  Goal status: Revised   4.  pt will use abdominal breathing 75% of the time in 8 minutes simple-mod complex conversation in three sessions  Baseline:  Goal status: Revised   5.  Pt will indicate 100% successful medication administration after 04/01/21 for 14 days, using strategies  Baseline:  Goal status: Ongoing     ASSESSMENT:   CLINICAL IMPRESSION: Patient is a 71 y.o. male who was seen today for assessment of speech clarity who demonstrated mod dysarthria and reduced breath support due to Parkinson's disease. Reiterated today  about "overshooting" loudnes ("shouting") necessary to achieve WNL loudness. See tx note. Cont with current POC. Pt did not meet majority of STGs, but has also only been seen for 6 sessions (9 were planned for STG assessment). LTGs were adjusted PRN. Pt would benefit from skilled ST targeting loudness in conversation, anomia strategies, and developing simple memory strategy for med administration due to occasional accidental skipping of meds due to swiping or stopping alarms without taking medication. Pt's OT will target attention.    OBJECTIVE IMPAIRMENTS  Objective impairments include memory, aphasia, and dysarthria. These impairments are limiting patient from managing medications, household responsibilities, and effectively communicating at home and in community.Factors affecting potential to achieve goals and functional outcome are previous level of function and severity of impairments.. Patient will benefit from skilled SLP services to address above impairments and improve overall function.   REHAB POTENTIAL: Good   PLAN: SLP FREQUENCY: 2x/week   SLP DURATION: 8 weeks   PLANNED INTERVENTIONS: Language facilitation, Environmental controls, Cueing hierachy, Internal/external aids, Functional tasks, Multimodal communication approach, SLP instruction and feedback, and Compensatory strategies   Novi Calia, CCC-SLP 04/08/2022, 3:41 PM

## 2022-04-08 ENCOUNTER — Ambulatory Visit (INDEPENDENT_AMBULATORY_CARE_PROVIDER_SITE_OTHER): Payer: Medicare Other | Admitting: Neurology

## 2022-04-08 ENCOUNTER — Ambulatory Visit: Payer: Medicare Other | Admitting: Occupational Therapy

## 2022-04-08 ENCOUNTER — Encounter: Payer: Self-pay | Admitting: Neurology

## 2022-04-08 ENCOUNTER — Ambulatory Visit: Payer: Medicare Other

## 2022-04-08 VITALS — BP 123/75 | HR 89 | Ht 66.0 in | Wt 211.0 lb

## 2022-04-08 DIAGNOSIS — R29818 Other symptoms and signs involving the nervous system: Secondary | ICD-10-CM

## 2022-04-08 DIAGNOSIS — R41841 Cognitive communication deficit: Secondary | ICD-10-CM

## 2022-04-08 DIAGNOSIS — R278 Other lack of coordination: Secondary | ICD-10-CM | POA: Diagnosis not present

## 2022-04-08 DIAGNOSIS — R471 Dysarthria and anarthria: Secondary | ICD-10-CM

## 2022-04-08 DIAGNOSIS — R4701 Aphasia: Secondary | ICD-10-CM

## 2022-04-08 DIAGNOSIS — R2681 Unsteadiness on feet: Secondary | ICD-10-CM

## 2022-04-08 DIAGNOSIS — R293 Abnormal posture: Secondary | ICD-10-CM | POA: Diagnosis not present

## 2022-04-08 DIAGNOSIS — G20 Parkinson's disease: Secondary | ICD-10-CM | POA: Diagnosis not present

## 2022-04-08 MED ORDER — PRAMIPEXOLE DIHYDROCHLORIDE 0.75 MG PO TABS: 0.7500 mg | ORAL_TABLET | Freq: Three times a day (TID) | ORAL | 2 refills | Status: DC

## 2022-04-08 NOTE — Therapy (Signed)
OUTPATIENT OCCUPATIONAL THERAPY  Treatment Session  Patient Name: [name] [name] MRN: [id number] DOB:07/09/1951, 71 y.o., male Today's Date: 04/08/2022  PCP: [name], DO REFERRING PROVIDER: Ludwig Clarks, DO    OT End of Session - 04/08/22 1514     Visit Number 4    Number of Visits 9    Date for OT Re-Evaluation 05/02/22    Authorization Type Medicare A & B    OT Start Time 1452    OT Stop Time 1532    OT Time Calculation (min) 40 min    Activity Tolerance Patient tolerated treatment well    Behavior During Therapy [facility] for tasks assessed/performed                Past Medical History:  Diagnosis Date   Cervical lymphadenopathy    right - followed by ENT   DOE (dyspnea on exertion) 07/15/2018   Dysphagia, neurologic 07/27/2014   Dystonia 1994   diagnosed in [place]   Enlarged lymph nodes 07/28/2007   Gastroesophageal reflux disease 03/07/2020   Localized swelling of right lower extremity 04/12/2019   Low back pain 11/21/2011   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   Mild neurocognitive disorder due to Parkinson's disease 01/10/2022   Non-Hodgkin lymphoma 2007   diffuse- 6 cycles of chemo with R CHOP Rituxan - last dose 10/08   Nonspecific elevation of levels of transaminase or lactic acid dehydrogenase (LDH) 07/06/2008   Parkinson's disease 07/27/2014   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   Past Surgical History:  Procedure Laterality Date   CHOLECYSTECTOMY, LAPAROSCOPIC     EYE SURGERY     x2 as child   PORT-A-CATH REMOVAL     PORTACATH PLACEMENT     SPLENECTOMY     TONSILLECTOMY     Patient Active Problem List   Diagnosis Date Noted   Mild neurocognitive disorder due to Parkinson's disease 01/10/2022   Gastroesophageal reflux disease 03/07/2020   Localized swelling of right lower extremity 04/12/2019   DOE (dyspnea on exertion) 07/15/2018   10 year risk of MI or stroke 7.5% or greater 04/12/2018    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   Dysphagia, neurologic 07/27/2014   Parkinson's disease ([place]) 07/27/2014   Dystonia 07/10/2014   Low back pain 11/21/2011   Nonspecific elevation of levels of transaminase or lactic acid dehydrogenase (LDH) 07/06/2008   Enlarged lymph nodes 07/28/2007   Non-Hodgkin lymphoma 03/23/2007    ONSET DATE: referral date 01/30/22 (diagnosed with PD in 2015)  REFERRING DIAG: G20 (ICD-10-CM) - Parkinson's disease   THERAPY DIAG:  Other symptoms and signs involving the nervous system  Other lack of coordination  Abnormal posture  Unsteadiness on feet  Rationale for Evaluation and Treatment Rehabilitation  SUBJECTIVE:   SUBJECTIVE STATEMENT: Pt reports "I brought someone with me" referring to wife bringing adapted Lucianne Lei. Pt accompanied by: self  PERTINENT HISTORY: PD, Chronic pain R shoulder; PISA syndrome   PAIN:  Are you having pain? Yes: NPRS scale: 2/10 Pain location: back Pain description: dull, nagging Aggravating factors: increased movement Relieving factors: rest   PATIENT GOALS a tune up to maintain progress made during previous OT sessions  OBJECTIVE:   TODAY'S TREATMENT:  Engaged in massed practice with getting into pt's wife's w/c adapted van.  Therapist instructing pt on body positioning and reaching for inside arm rest on seat to further propel and boost pt upward and inward to sit into seat.  Pt able  to complete with min cues fading to supervision.  Pt's wife requested that he attempt getting in to the back seat.  This required min assist at R hip to boost into seat due to height increase of ~2 inches from front seat to back seat due to addition of ramp into back of van.   Engaged in simulated LB dressing with use of gait belt to simulate pants.  Pt demonstrating significant improvements with threading LLE first in figure 4 position in sitting.  Pt asking questions  about how to then thread RLE without dropping pants to floor increasing challenge to reach pants.  Problem solved with therapist demonstration with spare pajama pants discussing placement of pants and/or positioning of RLE when attempting to thread pants.     PATIENT EDUCATION: Education details: ongoing condition specific education Person educated: Patient Education method: Explanation Education comprehension: verbalized understanding   HOME EXERCISE PROGRAM: TBD    GOALS: Goals reviewed with patient? Yes   LONG TERM GOALS: Target date: 05/02/22 (note: only LTGs due to plan to begin OT in about 3-4 weeks to allow pt to focus treatment on PT and SLP then phase in OT as PT phases out)  STG/ LTG  Status:  1 Pt will be Independent with PD specific HEP Baseline:  Progressing  2 Pt will verbalize understanding of adapted strategies and AE to maximize safety and I with ADLs/ IADLs  Baseline:  Progressing  3 Pt will demonstrate improved UE functional use for ADLs as evidenced by increasing box/ blocks score by 3 blocks with [name]/LUE Baseline: R: 44 and L: 41 (however hitting barrier multiple times on R) Progressing  4 Pt will demonstrate improved alternating attention by completing  table top scanning activity with Supervision Baseline:  Progressing  5 Pt will demonstrate understanding of memory compensations and ways to keep thinking skills sharp. Baseline: alarm goes off for his med and he snoozes or stops it and doesn't take his med. Progressing    ASSESSMENT:  CLINICAL IMPRESSION: Treatment session with focus on functional transfers and LB dressing. Pt is demonstrating carryover of previous education and demonstrating significant improvements with LB dressing and accessing wife's adaptive van.  Pt continues to benefit from massed practice with dressing tasks.  Pt demonstrating use of alarm and secondary alarm for medications and reports he does better if he applies the "do it now"  strategy.   PERFORMANCE DEFICITS in functional skills including ADLs, IADLs, coordination, dexterity, tone, ROM, strength, pain, flexibility, FMC, GMC, mobility, balance, body mechanics, endurance, decreased knowledge of precautions, decreased knowledge of use of DME, and UE functional use, cognitive skills including attention, memory, problem solving, and safety awareness, and psychosocial skills including environmental adaptation.   IMPAIRMENTS are limiting patient from ADLs, IADLs, leisure, and social participation.   COMORBIDITIES may have co-morbidities  that affects occupational performance. Patient will benefit from skilled OT to address above impairments and improve overall function.  MODIFICATION OR ASSISTANCE TO COMPLETE EVALUATION: No modification of tasks or assist necessary to complete an evaluation.  OT OCCUPATIONAL PROFILE AND HISTORY: Detailed assessment: Review of records and additional review of physical, cognitive, psychosocial history related to current functional performance.  CLINICAL DECISION MAKING: LOW - limited treatment options, no task modification necessary  REHAB POTENTIAL: Good  EVALUATION COMPLEXITY: Low    PLAN: OT FREQUENCY: 2x/week  OT DURATION: 8 weeks  PLANNED INTERVENTIONS: self care/ADL training, therapeutic exercise, therapeutic activity, neuromuscular re-education, balance training, functional mobility training, ultrasound, moist heat, cryotherapy, patient/family education,  cognitive remediation/compensation, psychosocial skills training, energy conservation, coping strategies training, and DME and/or AE instructions  RECOMMENDED OTHER SERVICES: NA  CONSULTED AND AGREED WITH PLAN OF CARE: Patient  PLAN FOR NEXT SESSION: Initiate large amplitude exercises.  Review LB dressing, car transfers, handwriting, and focus on attention    [place], [place], OTR/L 04/08/2022, 3:27 PM

## 2022-04-08 NOTE — Patient Instructions (Signed)
Local and Online Resources for Power over Parkinson's Group June 2023  LOCAL [place] PARKINSON'S GROUPS  Power over Parkinson's Group:   Power Over Parkinson's Patient Education Group will be Wednesday, June 14th-*Hybrid meting*- in person at [place] location and via WEBEX at 2:00 pm.   Upcoming Power over Parkinson's Meetings:  2nd Wednesdays of the month at 2 pm:   June 14th, July 12th Contact Amy Marriott at [email or web address] if interested in participating in this group Parkinson's Care Partners Group:    3rd Mondays, Contact Misty Paladino Atypical Parkinsonian Patient Group:   4th Wednesdays, Contact Misty Paladino If you are interested in participating in these groups with Misty, please contact her directly for how to join those meetings.  Her contact information is [email or web address].    LOCAL EVENTS AND NEW OFFERINGS Dance Class for People with Parkinson's at [employer].  Friday, June 9th at 2 pm.  Led by [employer] DPT students.  Contact [email or web address] to register or with questions. Ice Cream Social at [employer]!  Thursday, June 15th, 5:30-7:00 pm.  RSVP to [email or web address] for attendance and free ice cream. Parkinson's T-shirts for sale!  Designed by a local group member, with funds going to Movement Disorders Fund.  $25.00  Contact Misty to purchase  New PWR! Moves Community Fitness Instructor-Led Class offering at [employer]!  Wednesdays 1-2 pm, starting April 12th.   Contact Susan Laney, Fitness Manager at [employer].  [email or web address]  ONLINE EDUCATION AND SUPPORT Parkinson Foundation:  www.parkinson.org PD Health at Home continues:  Mindfulness Mondays, Wellness Wednesdays, Fitness Fridays  Upcoming Education: Parkinson's 101:  What You and Your Family Should Know.  Wednesday, June 7th at 1:00 pm Register for expert briefings (webinars) at  https://www.[email or web address].org/resources-support/online-education/expert-briefings-webinars Please check out their website to sign up for emails and see their full online offerings   [employer] Foundation:  www.michaeljfox.org  Third Thursday Webinars:  On the third Thursday of every month at 12 p.m. ET, join our free live webinars to learn about various aspects of living with Parkinson's disease and our work to speed medical breakthroughs. Upcoming Webinar: REPLAY:  From Low Blood Pressure to Bladder Problems:  A Look at Lesser Known Parkinson's Symptoms.  Thursday, June 15th at 12 noon. Check out additional information on their website to see their full online offerings  [employer] Phinney Foundation:  www.davisphinneyfoundation.org Upcoming Webinar:   Stay tuned Webinar Series:  Living with Parkinson's Meetup.   Third Thursdays each month, 3 pm Care Partner Monthly Meetup.  With Connie Carpenter Phinney.  First Tuesday of each month, 2 pm Check out additional information to Live Well Today on their website  Parkinson and Movement Disorders (PMD) Alliance:  www.pmdalliance.org [employer] Online:  Online Education Events Sign up for emails, which are sent weekly to give you updates on programming and online offerings  Parkinson's Association of [place]:  www.parkinsonassociation.org Information on online support groups, education events, and online exercises including Yoga, Parkinson's exercises and more-LOTS of information on links to PD resources and online events Virtual Support Group through Parkinson's Association of [place]; next one is scheduled for Wednesday, June 7th at 2 pm. (These are typically scheduled for the 1st Wednesday of the month at 2 pm).  Visit website for details. Save the date for "Caring for Parkinson's-Caring for You", 9th Annual Symposium.  In-person event in [place].  September 9th.  More info on registration to come. MOVEMENT AND EXERCISE OPPORTUNITIES PWR!  Moves Classes at [facility].  Wednesdays 10 and 11   am.   Contact Amy Marriott, PT [email or web address] if interested. NEW PWR! Moves Class offering at [employer].  Wednesdays 1-2 pm, starting April 12th.  Contact Susan Laney, Fitness Manager at [employer].  [email or web address] Here is a link to the PWR!Moves classes on Zoom from [place] Parkinson's Foundation - Daily Mon-Sat at 10:00. Via Zoom, FREE and open to all.  There is also a link below via Facebook if you use that platform.  https://www.parkinsonsmi.org/mpf-programs/exercise-and-movement-activities https://www.facebook.com/ParkinsonsMI.org/posts/pwr-moves-exercise-class-parkinson-wellness-recovery-online-with-angee-ludwa-pt-/10156827878021813/  Parkinson's Wellness Recovery (PWR! Moves)  www.pwr4life.org Info on the PWR! Virtual Experience:  You will have access to our expertise through self-assessment, guided plans that start with the PD-specific fundamentals, educational content, tips, Q&A with an expert, and a growing library of PD-specific pre-recorded and live exercise classes of varying types and intensity - both physical and cognitive! If that is not enough, we offer 1:1 wellness consultations (in-person or virtual) to personalize your PWR! Virtual Experience.  Parkinson Foundation Fitness Fridays:  As part of the PD Health @ Home program, this free video series focuses each week on one aspect of fitness designed to support people living with Parkinson's.  These weekly videos highlight the Parkinson Foundation recent fitness guidelines for people with Parkinson's disease. www.parkinson.org/resources-support/online-education/pdhealth#ff Dance for PD website is offering free, live-stream classes throughout the week, as well as links to digital library of classes:  https://danceforparkinsons.org/ Virtual dance and Pilates for Parkinson's classes: Click on the Community Tab> Parkinson's Movement Initiative  Tab.  To register for classes and for more information, visit www.americandancefestival.org and click the "community" tab.  [employer] Parkinson's Cycling Classes  [employer]:  Thursdays @ Noon-Live classes at [employer] (Contact Margaret Hazen at [email or web address] or [phone]) [employer]: Virtual Classes Mondays and Thursdays /Live classes Tuesday, Wednesday and Thursday (contact Marlee at [email or web address]  or [phone]) [place] [employer]ing Varied levels of classes are offered Tuesdays and Thursdays at [employer] [facility].  Stretching with Maria weekly class is also offered for people with Parkinson's To observe a class or for more information, call [phone] or email Hillary Savage at [email or web address] ADDITIONAL SUPPORT AND RESOURCES [facility] [employer]:Online Caregiver Education Opportunities:  www.well-springsolutions.org/caregiver-education/caregiver-support-group.  You may also contact Jodi Kolada at [email or web address] or [phone].    [facility] Navigator:  Just1Navigator program, a free service to help individuals and families through the journey of determining care for older adults.  The "Navigator" is a social worker, Nicole Reynolds, who will speak with a prospective client and/or loved ones to provide an assessment of the situation and a set of recommendations for a personalized care plan -- all free of charge, and whether [facility] offers the needed service or not. If the need is not a service we provide, we are well-connected with reputable programs in town that we can refer you to.  www.well-springsolutions.org or to speak with the Navigator, call [phone]. Family Caregiver Programming in June:  [employer], Thursday, June 15th 11-12:30 at [employer], [place].  Call [phone] to register  

## 2022-04-11 ENCOUNTER — Ambulatory Visit: Payer: Medicare Other

## 2022-04-11 ENCOUNTER — Ambulatory Visit: Payer: Medicare Other | Admitting: Occupational Therapy

## 2022-04-11 DIAGNOSIS — R4701 Aphasia: Secondary | ICD-10-CM

## 2022-04-11 DIAGNOSIS — R293 Abnormal posture: Secondary | ICD-10-CM | POA: Diagnosis not present

## 2022-04-11 DIAGNOSIS — R471 Dysarthria and anarthria: Secondary | ICD-10-CM

## 2022-04-11 DIAGNOSIS — R278 Other lack of coordination: Secondary | ICD-10-CM

## 2022-04-11 DIAGNOSIS — R2681 Unsteadiness on feet: Secondary | ICD-10-CM | POA: Diagnosis not present

## 2022-04-11 DIAGNOSIS — R29818 Other symptoms and signs involving the nervous system: Secondary | ICD-10-CM | POA: Diagnosis not present

## 2022-04-11 DIAGNOSIS — R41841 Cognitive communication deficit: Secondary | ICD-10-CM

## 2022-04-11 NOTE — Therapy (Signed)
OUTPATIENT SPEECH LANGUAGE PATHOLOGY TREATMENT NOTE   Patient Name: [name] [name] MRN: [id number] DOB:[birth year], 71 y.o., male Today's Date: 04/11/2022  PCP: [name], DO REFERRING PROVIDER: Loralie Champagne, DO  END OF SESSION:   End of Session - 04/11/22 1042     Visit Number 7    Number of Visits 17    Date for SLP Re-Evaluation 05/02/22    SLP Start Time 1025   checked in 1023   SLP Stop Time  1100    SLP Time Calculation (min) 35 min    Activity Tolerance Patient tolerated treatment well              Past Medical History:  Diagnosis Date   Cervical lymphadenopathy    right - followed by ENT   DOE (dyspnea on exertion) 07/15/2018   Dysphagia, neurologic 07/27/2014   Dystonia 1994   diagnosed in [place]   Enlarged lymph nodes 07/28/2007   Gastroesophageal reflux disease 03/07/2020   Localized swelling of right lower extremity 04/12/2019   Low back pain 11/21/2011   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   Mild neurocognitive disorder due to Parkinson's disease 01/10/2022   Non-Hodgkin lymphoma 2007   diffuse- 6 cycles of chemo with R CHOP Rituxan - last dose 10/08   Nonspecific elevation of levels of transaminase or lactic acid dehydrogenase (LDH) 07/06/2008   Parkinson's disease 07/27/2014   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   Past Surgical History:  Procedure Laterality Date   CHOLECYSTECTOMY, LAPAROSCOPIC     EYE SURGERY     x2 as child   PORT-A-CATH REMOVAL     PORTACATH PLACEMENT     SPLENECTOMY     TONSILLECTOMY     Patient Active Problem List   Diagnosis Date Noted   Mild neurocognitive disorder due to Parkinson's disease 01/10/2022   Gastroesophageal reflux disease 03/07/2020   Localized swelling of right lower extremity 04/12/2019   DOE (dyspnea on exertion) 07/15/2018   10 year risk of MI or stroke 7.5% or greater 04/12/2018   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   Meige syndrome (blepharospasm with  oromandibular dystonia) 07/27/2014   Dysphagia, neurologic 07/27/2014   Parkinson's disease ([place]) 07/27/2014   Dystonia 07/10/2014   Low back pain 11/21/2011   Nonspecific elevation of levels of transaminase or lactic acid dehydrogenase (LDH) 07/06/2008   Enlarged lymph nodes 07/28/2007   Non-Hodgkin lymphoma 03/23/2007    ONSET DATE: Dx in mid-2010s  REFERRING DIAG: G20 - Parkinson's disease   THERAPY DIAG:  Dysarthria and anarthria  Aphasia  Cognitive communication deficit  Rationale for Evaluation and Treatment Rehabilitation  SUBJECTIVE: [name] stated he "tried the shouting" with his wife and reports his wife said, "You're talking normal."  PAIN:  Are you having pain? No   OBJECTIVE:   TODAY'S TREATMENT:  04/11/22: Pt, along with "s" statement, stated he went to [place] and was not asked to repeat. Pt's conversation was sub-WNL today, despite SLP cues occasionally to "shout". Louder speech volume targeted today with loud /a/ (average low-mid 90s dB), and everyday sentences (average low-mid 80s dB). In sentnece responses pt maintained WNL loudness in 16/20 sentences. SLP provided pt with multiple-sentence homework.  04/08/22: Increased speech volume targeted today with loud /a/ (average lower 90s), and everyday sentences (average low-mid 80s dB). "I put them into my phone" pt stated today to SLP, so that he would not misplace them again. SLP asked pt about the four areas where he would like to see improvement with voice volume (  Bible study, ordering at restaurants, at ball game, and at [place]). Pt usually gets broiled salmon, mashed potatoes, and broccoli at [employer] where he usually experiences difficulty with volume when ordering. SLP worked on this phrase with pt specifically today. SLP had pt repeat his usual order x2, with WNL volume (mid 70s dB). SLP then played cafeteria noise and role played with pt and pt intelligible 100%, at upper 60s - low 70s dB. Also  simulated/role played with pt with ordering at drive thru; pt at upper 60s dB average. AFter SLP explained pt will need to think "shout" in order to "overshoot" the target to achieve WNL volume, pt hit lower 70s average x2.   04/02/22: SLP targeted increased vocal intensity this session. Pt reports that on average he completes 6 sustained /a/. Sustained /a/ (dB): averaged 92 dB.  -Functional sentences: 2 sets of 10 - First set (over the fence voice): 72 dB avg -Second set (authoritative): 71 dB avg with model   Completed a 4-min conversation about "the most favorite trip he's ever taken". Avg 60-65 dB  Pt reported he forgot to do his HEP and does not recall if he has the sentences at home. Provided with sentences in case he misplaced sentences at home. SLP also told pt he could read newspaper or whatever reading material he enjoys, sentence by sentence. Cont with current POC.  03/20/22: Louder speech was targeted this session with loud /a/ at average in low 90s, everyday sentences with average upper upper 70s - low 80s dB. With sentence level responses pt had average of mid 60s- upper 60s dB loudness. In multiple sentences, pt loudness dropped to low-mid 60s dB, even after encouragement to incr volume.  Pt stated he has been 100% with his meds since previous session. Homework for sentence level reading and responses; Instructed pt to work 15-30 minutes BID.  03/18/22: [name] agreed with STGs and LTGs when discussed with him. SLP and pt talked about how pt could make 7 days consecutive with WNL medication administration. He set second alarms on his phone for 5 minutes after alarms set on his watch, in order to hear one of the two and then administer his own meds. Regarding "s" statement, [name] made 3 phone calls this morning without anyone asking him to repeat. Louder speech was targeted this session with loud /a/ at average in low 90s, everyday sentences with average upper 70s low 80s dB. Pt and SLP talked  for approx 5 minutes about the need to complete loud /a/ and sentences BID and not QD. SLP targeted abdominal breathing (AB) with pt and he req'd min cues initially however was independent after approx 60 seconds, at rest.    PATIENT EDUCATION: Education details: see above in "today's treatment" Person educated: Patient Education method: Explanation and Handouts Education comprehension: verbalized understanding and downloaded app     HOME EXERCISE PROGRAM: See above in "today's treatment"     GOALS: Goals reviewed with patient? Yes   SHORT TERM GOALS: Target date: 04/01/2022     pt will produce loud /a/ with at least low 90s dB average over three sessions Baseline: 03/18/22, 03/20/22 Goal status: met   2.  Pt will produce 16/20 sentence responses with WNL volume over two sessions Baseline:  Goal status: Not met   3.  Pt will produce 5 minutes simple conversation with volume upper 60s dB average over three sessions Baseline:  Goal status: Not met   4.  pt will generate abdominal  breathing 80% of the time when engaging in 5 minutes simple conversation in 2 sessions Baseline:  Goal status: Not met   5.  Pt will indicate 100% successful medication administration for 7 days using strategies Baseline:  Goal status: Met     LONG TERM GOALS: Target date: 05/02/2022     Pt will score higher than 21/32 on CES in the final two ST sessions Baseline:  Goal status: Ongoing   2.  Pt will demo successful usage of anomia compensations PRN in 8 minutes mod complex conversation in 2 sessions Baseline:  Goal status: Revised   3.  Pt will maintain average of upper 60s dB over 8 minute simple-mod complex conversation with rare min A in three sessions  Baseline:  Goal status: Revised   4.  pt will use abdominal breathing 75% of the time in 8 minutes simple-mod complex conversation in three sessions  Baseline:  Goal status: Revised   5.  Pt will indicate 100% successful medication  administration after 04/01/21 for 14 days, using strategies  Baseline:  Goal status: Ongoing     ASSESSMENT:   CLINICAL IMPRESSION: Patient is a 71 y.o. male who was seen today for treatment of speech clarity who demonstrated mod dysarthria and reduced breath support due to Parkinson's disease. Pt is beginning to see success in settings outside [place]. See tx note. Cont with current POC. Pt did not meet majority of STGs, but has also only been seen for 6 sessions (9 were planned for STG assessment). LTGs were adjusted PRN. Pt would benefit from skilled ST targeting loudness in conversation, anomia strategies, and developing simple memory strategy for med administration due to occasional accidental skipping of meds due to swiping or stopping alarms without taking medication. Pt's OT will target attention.    OBJECTIVE IMPAIRMENTS  Objective impairments include memory, aphasia, and dysarthria. These impairments are limiting patient from managing medications, household responsibilities, and effectively communicating at home and in community.Factors affecting potential to achieve goals and functional outcome are previous level of function and severity of impairments.. Patient will benefit from skilled SLP services to address above impairments and improve overall function.   REHAB POTENTIAL: Good   PLAN: SLP FREQUENCY: 2x/week   SLP DURATION: 8 weeks   PLANNED INTERVENTIONS: Language facilitation, Environmental controls, Cueing hierachy, Internal/external aids, Functional tasks, Multimodal communication approach, SLP instruction and feedback, and Compensatory strategies   Teegan Brandis, CCC-SLP 04/11/2022, 10:43 AM

## 2022-04-11 NOTE — Patient Instructions (Signed)
PWR! Hands  With arms stretched out in front of you (elbows straight), perform the following: PWR! Up: Close hands and flick fingers open and apart BIG PWR! Rock: Move wrists up and down [employer]! Twist: Twist palms up and down BIG PWR! Step: Touch index finger to thumb while keeping other fingers straight. Flick fingers out BIG (thumb out/straighten fingers). Repeat with other fingers. (Step your thumb to each finger). PWR! Hands: Push hands out BIG. Elbows straight, wrists up, fingers open and spread apart BIG. (Can also perform by pushing down on table, chair, knees. Push above head, out to the side, behind you, in front of you.)  Then, start with elbows bent and hands closed. PWR! Up: Close hands and flick fingers open and apart BIG PWR! Rock: Move wrists up and down [employer]! Twist: Twist palms up and down BIG PWR! Step: Touch index finger to thumb while keeping other fingers straight. Flick fingers out BIG (thumb out/straighten fingers). Repeat with other fingers. (Step your thumb to each finger). PWR! Hands: Push hands out BIG. Elbows straight, wrists up, fingers open and spread apart BIG. (Can also perform by pushing down on table, chair, knees. Push above head, out to the side, behind you, in front of you.)   ** Make each movement big and deliberate so that you feel the movement.  Perform at least 10 repetitions 1x/day, but perform PWR! hands throughout the day when you are having trouble using your hands (picking up/manipulating small objects, writing, eating, typing, sewing, buttoning, etc.).

## 2022-04-11 NOTE — Therapy (Signed)
OUTPATIENT OCCUPATIONAL THERAPY  Treatment Session  Patient Name: [name] [name] MRN: [id number] DOB:June 30, 1951, 71 y.o., male Today's Date: 04/11/2022  PCP: [name], DO REFERRING PROVIDER: Ludwig Clarks, DO    OT End of Session - 04/11/22 1107     Visit Number 5    Number of Visits 9    Date for OT Re-Evaluation 05/02/22    Authorization Type Medicare A & B    OT Start Time 1104    OT Stop Time 1143    OT Time Calculation (min) 39 min    Activity Tolerance Patient tolerated treatment well    Behavior During Therapy WFL for tasks assessed/performed                 Past Medical History:  Diagnosis Date   Cervical lymphadenopathy    right - followed by ENT   DOE (dyspnea on exertion) 07/15/2018   Dysphagia, neurologic 07/27/2014   Dystonia 1994   diagnosed in [place]   Enlarged lymph nodes 07/28/2007   Gastroesophageal reflux disease 03/07/2020   Localized swelling of right lower extremity 04/12/2019   Low back pain 11/21/2011   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   Mild neurocognitive disorder due to Parkinson's disease 01/10/2022   Non-Hodgkin lymphoma 2007   diffuse- 6 cycles of chemo with R CHOP Rituxan - last dose 10/08   Nonspecific elevation of levels of transaminase or lactic acid dehydrogenase (LDH) 07/06/2008   Parkinson's disease 07/27/2014   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   Past Surgical History:  Procedure Laterality Date   CHOLECYSTECTOMY, LAPAROSCOPIC     EYE SURGERY     x2 as child   PORT-A-CATH REMOVAL     PORTACATH PLACEMENT     SPLENECTOMY     TONSILLECTOMY     Patient Active Problem List   Diagnosis Date Noted   Mild neurocognitive disorder due to Parkinson's disease 01/10/2022   Gastroesophageal reflux disease 03/07/2020   Localized swelling of right lower extremity 04/12/2019   DOE (dyspnea on exertion) 07/15/2018   10 year risk of MI or stroke 7.5% or greater 04/12/2018    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   Dysphagia, neurologic 07/27/2014   Parkinson's disease ([place]) 07/27/2014   Dystonia 07/10/2014   Low back pain 11/21/2011   Nonspecific elevation of levels of transaminase or lactic acid dehydrogenase (LDH) 07/06/2008   Enlarged lymph nodes 07/28/2007   Non-Hodgkin lymphoma 03/23/2007    ONSET DATE: referral date 01/30/22 (diagnosed with PD in 2015)  REFERRING DIAG: G20 (ICD-10-CM) - Parkinson's disease   THERAPY DIAG:  Abnormal posture  Other symptoms and signs involving the nervous system  Other lack of coordination  Unsteadiness on feet  Rationale for Evaluation and Treatment Rehabilitation  SUBJECTIVE:   SUBJECTIVE STATEMENT: Pt reports "things are fantastic." Pt accompanied by: self  PERTINENT HISTORY: PD, Chronic pain R shoulder; PISA syndrome   PAIN:  Are you having pain? Yes: NPRS scale: 2/10 Pain location: back Pain description: dull, nagging Aggravating factors: increased movement Relieving factors: rest   PATIENT GOALS a tune up to maintain progress made during previous OT sessions  OBJECTIVE:   TODAY'S TREATMENT:  Box and Blocks: Right: 40 and Left: 38.  Pt demonstrating increased lateral lean to right and pt adjusting box and blocks set up to offset lean.  Despite instruction, pt continues to toss blocks without crossing barrier approx 50% of time and continues to hit barrier.  Pt demonstrating  decreased [place] this session compared to evaluation. Reviewed large amplitude exercises. Therapist providing mod cues and demonstration for improved quality of movement.  Pt continues to move too quickly during exercises and benefits from cues for technique and to slow down to focus on quality of movement.  Therapist introduced overhead reaching and reaching out to R and L during PWR! Moves.  Therapist providing demonstration and cues while also providing cues for  functional carryover.   Engaged in discussion about coordination HEP from previous OT.  Pt voicing amount of time it takes to complete all exercises, therefore reiterated importance of tasks in function. Discussed use of screwing/unscrewing caps vs flip tops and coordination with hobbies.  Plan to further discuss at next session and draw parallel to functional tasks and hobbies to increase carryover.   PATIENT EDUCATION: Education details: ongoing condition specific education Person educated: Patient Education method: Explanation Education comprehension: verbalized understanding   HOME EXERCISE PROGRAM: PWR! Hands - see pt instructions    GOALS: Goals reviewed with patient? Yes   LONG TERM GOALS: Target date: 05/02/22 (note: only LTGs due to plan to begin OT in about 3-4 weeks to allow pt to focus treatment on PT and SLP then phase in OT as PT phases out)  STG/ LTG  Status:  1 Pt will be Independent with PD specific HEP Baseline:  Progressing  2 Pt will verbalize understanding of adapted strategies and AE to maximize safety and I with ADLs/ IADLs  Baseline:  Progressing  3 Pt will demonstrate improved UE functional use for ADLs as evidenced by increasing box/ blocks score by 3 blocks with [name]/LUE Baseline: R: 44 and L: 41 (however hitting barrier multiple times on R) Progressing  4 Pt will demonstrate improved alternating attention by completing  table top scanning activity with Supervision Baseline:  Progressing  5 Pt will demonstrate understanding of memory compensations and ways to keep thinking skills sharp. Baseline: alarm goes off for his med and he snoozes or stops it and doesn't take his med. Progressing - 7/18 pt reports having a back up alarm to increase recall and "do it now" strategy     ASSESSMENT:  CLINICAL IMPRESSION: Treatment session with focus on large amplitude movements and focusing on quality of movements.  Pt continues to demonstrate decreased [place] and speed  impacting functional tasks and hobbies.  Pt continues to move very quickly during large amplitude exercises and structured tasks, sometimes compromising quality and speed of tasks.   PERFORMANCE DEFICITS in functional skills including ADLs, IADLs, coordination, dexterity, tone, ROM, strength, pain, flexibility, FMC, GMC, mobility, balance, body mechanics, endurance, decreased knowledge of precautions, decreased knowledge of use of DME, and UE functional use, cognitive skills including attention, memory, problem solving, and safety awareness, and psychosocial skills including environmental adaptation.   IMPAIRMENTS are limiting patient from ADLs, IADLs, leisure, and social participation.   COMORBIDITIES may have co-morbidities  that affects occupational performance. Patient will benefit from skilled OT to address above impairments and improve overall function.  MODIFICATION OR ASSISTANCE TO COMPLETE EVALUATION: No modification of tasks or assist necessary to complete an evaluation.  OT OCCUPATIONAL PROFILE AND HISTORY: Detailed assessment: Review of records and additional review of physical, cognitive, psychosocial history related to current functional performance.  CLINICAL DECISION MAKING: LOW - limited treatment options, no task modification necessary  REHAB POTENTIAL: Good  EVALUATION COMPLEXITY: Low    PLAN: OT FREQUENCY: 2x/week  OT DURATION: 8 weeks  PLANNED INTERVENTIONS: self care/ADL training, therapeutic exercise, therapeutic  activity, neuromuscular re-education, balance training, functional mobility training, ultrasound, moist heat, cryotherapy, patient/family education, cognitive remediation/compensation, psychosocial skills training, energy conservation, coping strategies training, and DME and/or AE instructions  RECOMMENDED OTHER SERVICES: NA  CONSULTED AND AGREED WITH PLAN OF CARE: Patient  PLAN FOR NEXT SESSION: Review coordination HEP (provided additional one) and  focus on carryover to pt preferred tasks and hobbies.  Simonne Come, OTR/L 04/11/2022, 11:51 AM

## 2022-04-14 ENCOUNTER — Encounter: Payer: Medicare Other | Admitting: Psychology

## 2022-04-15 ENCOUNTER — Ambulatory Visit: Payer: Medicare Other

## 2022-04-15 ENCOUNTER — Ambulatory Visit: Payer: Medicare Other | Admitting: Occupational Therapy

## 2022-04-15 DIAGNOSIS — R2681 Unsteadiness on feet: Secondary | ICD-10-CM

## 2022-04-15 DIAGNOSIS — R471 Dysarthria and anarthria: Secondary | ICD-10-CM | POA: Diagnosis not present

## 2022-04-15 DIAGNOSIS — R29818 Other symptoms and signs involving the nervous system: Secondary | ICD-10-CM

## 2022-04-15 DIAGNOSIS — R293 Abnormal posture: Secondary | ICD-10-CM

## 2022-04-15 DIAGNOSIS — R278 Other lack of coordination: Secondary | ICD-10-CM | POA: Diagnosis not present

## 2022-04-15 DIAGNOSIS — R4701 Aphasia: Secondary | ICD-10-CM

## 2022-04-15 DIAGNOSIS — R41841 Cognitive communication deficit: Secondary | ICD-10-CM

## 2022-04-15 NOTE — Therapy (Signed)
OUTPATIENT OCCUPATIONAL THERAPY  Treatment Session & Discharge  Patient Name: [name] [name] MRN: [id number] DOB:27-Jul-1951, 71 y.o., male Today's Date: 04/15/2022  PCP: [name], DO REFERRING PROVIDER: Tat, Eustace Quail, DO   OCCUPATIONAL THERAPY DISCHARGE SUMMARY  Visits from Start of Care: 6  Current functional level related to goals / functional outcomes: Pt is demonstrating improvements in ability to complete ADLs and IADLs in timely and safe manner.  Pt demonstrating improvements with LB dressing with new technique.  Pt has purchased AE for increased safety and ease with ADLs and IADLs.  Pt continues to demonstrate mild decreased gross motor coordination and postural limitations, but has shown improvements towards all goals - meeting 4 of 5 LTGs.   Remaining deficits: Pt continues to demonstrate mild decreased gross motor coordination and postural limitations   Education / Equipment: Pt has been educated on large amplitude exercises, coordination HEP, recommendations for AE, and adaptive strategies for LB dressing and accessing spouse's van.   Patient agrees to discharge. Patient goals were met. Patient is being discharged due to meeting the stated rehab goals..      OT End of Session - 04/15/22 1411     Visit Number 6    Number of Visits 9    Date for OT Re-Evaluation 05/02/22    Authorization Type Medicare A & B    OT Start Time 1316    OT Stop Time 1351    OT Time Calculation (min) 35 min    Activity Tolerance Patient tolerated treatment well    Behavior During Therapy WFL for tasks assessed/performed                  Past Medical History:  Diagnosis Date   Cervical lymphadenopathy    right - followed by ENT   DOE (dyspnea on exertion) 07/15/2018   Dysphagia, neurologic 07/27/2014   Dystonia 1994   diagnosed in [place]   Enlarged lymph nodes 07/28/2007   Gastroesophageal reflux disease 03/07/2020   Localized swelling of right lower  extremity 04/12/2019   Low back pain 11/21/2011   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   Mild neurocognitive disorder due to Parkinson's disease 01/10/2022   Non-Hodgkin lymphoma 2007   diffuse- 6 cycles of chemo with R CHOP Rituxan - last dose 10/08   Nonspecific elevation of levels of transaminase or lactic acid dehydrogenase (LDH) 07/06/2008   Parkinson's disease 07/27/2014   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   Past Surgical History:  Procedure Laterality Date   CHOLECYSTECTOMY, LAPAROSCOPIC     EYE SURGERY     x2 as child   PORT-A-CATH REMOVAL     PORTACATH PLACEMENT     SPLENECTOMY     TONSILLECTOMY     Patient Active Problem List   Diagnosis Date Noted   Mild neurocognitive disorder due to Parkinson's disease 01/10/2022   Gastroesophageal reflux disease 03/07/2020   Localized swelling of right lower extremity 04/12/2019   DOE (dyspnea on exertion) 07/15/2018   10 year risk of MI or stroke 7.5% or greater 04/12/2018   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   Dysphagia, neurologic 07/27/2014   Parkinson's disease ([place]) 07/27/2014   Dystonia 07/10/2014   Low back pain 11/21/2011   Nonspecific elevation of levels of transaminase or lactic acid dehydrogenase (LDH) 07/06/2008   Enlarged lymph nodes 07/28/2007   Non-Hodgkin lymphoma 03/23/2007    ONSET DATE: referral date 01/30/22 (diagnosed with PD in 2015)  REFERRING DIAG: G20 (  ICD-10-CM) - Parkinson's disease   THERAPY DIAG:  Abnormal posture  Other symptoms and signs involving the nervous system  Other lack of coordination  Unsteadiness on feet  Rationale for Evaluation and Treatment Rehabilitation  SUBJECTIVE:   SUBJECTIVE STATEMENT: Pt reports "things are excellent". Pt accompanied by: self  PERTINENT HISTORY: PD, Chronic pain R shoulder; PISA syndrome   PAIN:  Are you having pain? Yes: NPRS scale: 2/10 Pain  location: back Pain description: dull, nagging Aggravating factors: increased movement Relieving factors: rest   PATIENT GOALS a tune up to maintain progress made during previous OT sessions  OBJECTIVE:   TODAY'S TREATMENT:  Pt reports significant improvement with getting pants on utilizing strategy as instructed during previous sessions.  Pt reports getting in to wife's Lucianne Lei is "cake" with technique performed during previous session. Pt reports purchasing a step ladder with handrails to allow for increased safety with changing light bulbs.  Pt reports utilizing this step ladder with improved stability and safety in home.   Pt reports continued difficulty with washing back and applying lotion to his back.  Therapist provided suggestions and aided pt in researching long handled loofah with angled handle to increase success with washing back and applying lotion.  Box and Blocks: L: 40 and R: 47.  Despite instruction, pt continues to toss blocks without crossing barrier approx 50% of time and continues to hit barrier.     PATIENT EDUCATION: Education details: ongoing condition specific education Person educated: Patient Education method: Explanation Education comprehension: verbalized understanding   HOME EXERCISE PROGRAM: PWR! Hands - see pt instructions    GOALS: Goals reviewed with patient? Yes   LONG TERM GOALS: Target date: 05/02/22 (note: only LTGs due to plan to begin OT in about 3-4 weeks to allow pt to focus treatment on PT and SLP then phase in OT as PT phases out)  STG/ LTG  Status:  1 Pt will be Independent with PD specific HEP Baseline:  Met - reports completing these every day  2 Pt will verbalize understanding of adapted strategies and AE to maximize safety and I with ADLs/ IADLs  Baseline:  Met - reports significant improvements   3 Pt will demonstrate improved UE functional use for ADLs as evidenced by increasing box/ blocks score by 3 blocks with  [name]/LUE Baseline: R: 44 and L: 41 (however hitting barrier multiple times on R) Partly met - R: 47 and L: 40 (continues to hit barrier bilaterally)  4 Pt will demonstrate improved alternating attention by completing  table top scanning activity with Supervision Baseline:  Met  5 Pt will demonstrate understanding of memory compensations and ways to keep thinking skills sharp. Baseline: alarm goes off for his med and he snoozes or stops it and doesn't take his med. Met - 7/18 pt reports having a back up alarm to increase recall and "do it now" strategy     ASSESSMENT:  CLINICAL IMPRESSION: Treatment session with focus on identifying remaining areas of challenge with ADLs and IADLs.  Pt reports he and his wife both notice improvements in quality of movements with LB dressing and getting in to her handicap accessible van.  Pt reports modifications and use of adaptive devices at home to increase safety with ADLs and IADLs.  Pt receptive to education on use of long handled sponge for washing back and applying lotion to back, plan to purchase today.  Pt reports improved confidence with ADLs and IADLs and agreeable to d/c at this time.  PERFORMANCE DEFICITS in functional skills including ADLs, IADLs, coordination, dexterity, tone, ROM, strength, pain, flexibility, FMC, GMC, mobility, balance, body mechanics, endurance, decreased knowledge of precautions, decreased knowledge of use of DME, and UE functional use, cognitive skills including attention, memory, problem solving, and safety awareness, and psychosocial skills including environmental adaptation.   IMPAIRMENTS are limiting patient from ADLs, IADLs, leisure, and social participation.   COMORBIDITIES may have co-morbidities  that affects occupational performance. Patient will benefit from skilled OT to address above impairments and improve overall function.  MODIFICATION OR ASSISTANCE TO COMPLETE EVALUATION: No modification of tasks or assist  necessary to complete an evaluation.  OT OCCUPATIONAL PROFILE AND HISTORY: Detailed assessment: Review of records and additional review of physical, cognitive, psychosocial history related to current functional performance.  CLINICAL DECISION MAKING: LOW - limited treatment options, no task modification necessary  REHAB POTENTIAL: Good  EVALUATION COMPLEXITY: Low    PLAN: OT FREQUENCY: 2x/week  OT DURATION: 8 weeks  PLANNED INTERVENTIONS: self care/ADL training, therapeutic exercise, therapeutic activity, neuromuscular re-education, balance training, functional mobility training, ultrasound, moist heat, cryotherapy, patient/family education, cognitive remediation/compensation, psychosocial skills training, energy conservation, coping strategies training, and DME and/or AE instructions  RECOMMENDED OTHER SERVICES: NA  CONSULTED AND AGREED WITH PLAN OF CARE: Patient  PLAN FOR NEXT SESSION: D/C at this time. Recommend return for evaluation in ~6 months after d/c from speech therapy.  Simonne Come, OTR/L 04/15/2022, 2:12 PM

## 2022-04-15 NOTE — Therapy (Unsigned)
OUTPATIENT SPEECH LANGUAGE PATHOLOGY TREATMENT NOTE   Patient Name: [name] [name] MRN: [id number] DOB:[birth year], 71 y.o., male Today's Date: 04/16/2022  PCP: [name], DO REFERRING PROVIDER: Loralie Champagne, DO  END OF SESSION:   End of Session - 04/16/22 0040     Visit Number 8    Number of Visits 17    Date for SLP Re-Evaluation 05/02/22    SLP Start Time 1406    SLP Stop Time  1445    SLP Time Calculation (min) 39 min    Activity Tolerance Patient tolerated treatment well               Past Medical History:  Diagnosis Date   Cervical lymphadenopathy    right - followed by ENT   DOE (dyspnea on exertion) 07/15/2018   Dysphagia, neurologic 07/27/2014   Dystonia 1994   diagnosed in [place]   Enlarged lymph nodes 07/28/2007   Gastroesophageal reflux disease 03/07/2020   Localized swelling of right lower extremity 04/12/2019   Low back pain 11/21/2011   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   Mild neurocognitive disorder due to Parkinson's disease 01/10/2022   Non-Hodgkin lymphoma 2007   diffuse- 6 cycles of chemo with R CHOP Rituxan - last dose 10/08   Nonspecific elevation of levels of transaminase or lactic acid dehydrogenase (LDH) 07/06/2008   Parkinson's disease 07/27/2014   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   Past Surgical History:  Procedure Laterality Date   CHOLECYSTECTOMY, LAPAROSCOPIC     EYE SURGERY     x2 as child   PORT-A-CATH REMOVAL     PORTACATH PLACEMENT     SPLENECTOMY     TONSILLECTOMY     Patient Active Problem List   Diagnosis Date Noted   Mild neurocognitive disorder due to Parkinson's disease 01/10/2022   Gastroesophageal reflux disease 03/07/2020   Localized swelling of right lower extremity 04/12/2019   DOE (dyspnea on exertion) 07/15/2018   10 year risk of MI or stroke 7.5% or greater 04/12/2018   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   Meige syndrome (blepharospasm with oromandibular  dystonia) 07/27/2014   Dysphagia, neurologic 07/27/2014   Parkinson's disease ([place]) 07/27/2014   Dystonia 07/10/2014   Low back pain 11/21/2011   Nonspecific elevation of levels of transaminase or lactic acid dehydrogenase (LDH) 07/06/2008   Enlarged lymph nodes 07/28/2007   Non-Hodgkin lymphoma 03/23/2007    ONSET DATE: Dx in mid-2010s  REFERRING DIAG: G20 - Parkinson's disease   THERAPY DIAG:  Dysarthria and anarthria  Aphasia  Cognitive communication deficit  Rationale for Evaluation and Treatment Rehabilitation  SUBJECTIVE: "I'd like to continue with you." (Pt is now d/c'd from PT and OT)   "Every time I noticed I was getting soft I stopped and reset. Nobody asked me to repeat the whole lunch."  PAIN:  Are you having pain? No   OBJECTIVE:   TODAY'S TREATMENT:  04/15/22: Pt also reported that during his [employer] class nobody asked him to repeat. Pt is making more opportunities each day for him to persist in muscle memory that is adequate for WNL volume speech. Explained/educated pt that he could perform as many as 8 loud /a/ at home BID. Louder speech volume targeted today with loud /a/ (average low 90s dB), and everyday sentences (average low 80s dB). One-minute conversational segments were completed today; In first segment, pt stayed WNL volume for 40 seconds and this increased to 45-50 seconds as the number of segments progressed. Pt to cont his homework  from previous session.  04/11/22: Pt, along with "s" statement, stated he went to [place] and was not asked to repeat. Pt's conversation was sub-WNL today, despite SLP cues occasionally to "shout". Louder speech volume targeted today with loud /a/ (average low-mid 90s dB), and everyday sentences (average low-mid 80s dB). In sentnece responses pt maintained WNL loudness in 16/20 sentences. SLP provided pt with multiple-sentence homework.  04/08/22: Increased speech volume targeted today with loud /a/ (average lower  90s), and everyday sentences (average low-mid 80s dB). "I put them into my phone" pt stated today to SLP, so that he would not misplace them again. SLP asked pt about the four areas where he would like to see improvement with voice volume (Bible study, ordering at restaurants, at ball game, and at [place] [employer]). Pt usually gets broiled salmon, mashed potatoes, and broccoli at [employer] where he usually experiences difficulty with volume when ordering. SLP worked on this phrase with pt specifically today. SLP had pt repeat his usual order x2, with WNL volume (mid 70s dB). SLP then played cafeteria noise and role played with pt and pt intelligible 100%, at upper 60s - low 70s dB. Also simulated/role played with pt with ordering at drive thru; pt at upper 60s dB average. AFter SLP explained pt will need to think "shout" in order to "overshoot" the target to achieve WNL volume, pt hit lower 70s average x2.   04/02/22: SLP targeted increased vocal intensity this session. Pt reports that on average he completes 6 sustained /a/. Sustained /a/ (dB): averaged 92 dB.  -Functional sentences: 2 sets of 10 - First set (over the fence voice): 72 dB avg -Second set (authoritative): 71 dB avg with model   Completed a 4-min conversation about "the most favorite trip he's ever taken". Avg 60-65 dB  Pt reported he forgot to do his HEP and does not recall if he has the sentences at home. Provided with sentences in case he misplaced sentences at home. SLP also told pt he could read newspaper or whatever reading material he enjoys, sentence by sentence. Cont with current POC.  03/20/22: Louder speech was targeted this session with loud /a/ at average in low 90s, everyday sentences with average upper upper 70s - low 80s dB. With sentence level responses pt had average of mid 60s- upper 60s dB loudness. In multiple sentences, pt loudness dropped to low-mid 60s dB, even after encouragement to incr volume.  Pt stated he has been 100%  with his meds since previous session. Homework for sentence level reading and responses; Instructed pt to work 15-30 minutes BID.   PATIENT EDUCATION: Education details: see above in "today's treatment" Person educated: Patient Education method: Explanation and Handouts Education comprehension: verbalized understanding and downloaded app     HOME EXERCISE PROGRAM: See above in "today's treatment"     GOALS: Goals reviewed with patient? Yes   SHORT TERM GOALS: Target date: 04/01/2022     pt will produce loud /a/ with at least low 90s dB average over three sessions Baseline: 03/18/22, 03/20/22 Goal status: met   2.  Pt will produce 16/20 sentence responses with WNL volume over two sessions Baseline:  Goal status: Not met   3.  Pt will produce 5 minutes simple conversation with volume upper 60s dB average over three sessions Baseline:  Goal status: Not met   4.  pt will generate abdominal breathing 80% of the time when engaging in 5 minutes simple conversation in 2 sessions Baseline:  Goal  status: Not met   5.  Pt will indicate 100% successful medication administration for 7 days using strategies Baseline:  Goal status: Met     LONG TERM GOALS: Target date: 05/02/2022     Pt will score higher than 21/32 on CES in the final two ST sessions Baseline:  Goal status: Ongoing   2.  Pt will demo successful usage of anomia compensations PRN in 8 minutes mod complex conversation in 2 sessions Baseline:  Goal status: Deferred (no anomia observed in last 5 sessions)   3.  Pt will maintain average of upper 60s dB over 8 minute simple-mod complex conversation with rare min A in three sessions  Baseline:  Goal status: Revised   4.  pt will use abdominal breathing 75% of the time in 8 minutes simple-mod complex conversation in three sessions  Baseline:  Goal status: Revised   5.  Pt will indicate 100% successful medication administration after 04/01/21 for 14 days, using  strategies  Baseline:  Goal status: Ongoing     ASSESSMENT:   CLINICAL IMPRESSION: Patient is a 70 y.o. male who was seen today for treatment of speech clarity who demonstrated mod dysarthria and reduced breath support due to Parkinson's disease. Pt continues to see success in settings outside [place]. See tx note. Cont with current POC. Pt would benefit from cont'd skilled ST targeting loudness in conversation, anomia strategies, and developing simple memory strategy for med administration due to occasional accidental skipping of meds due to swiping or stopping alarms without taking medication. Pt's OT will target attention.    OBJECTIVE IMPAIRMENTS  Objective impairments include memory, aphasia, and dysarthria. These impairments are limiting patient from managing medications, household responsibilities, and effectively communicating at home and in community.Factors affecting potential to achieve goals and functional outcome are previous level of function and severity of impairments.. Patient will benefit from skilled SLP services to address above impairments and improve overall function.   REHAB POTENTIAL: Good   PLAN: SLP FREQUENCY: 2x/week   SLP DURATION: 8 weeks   PLANNED INTERVENTIONS: Language facilitation, Environmental controls, Cueing hierachy, Internal/external aids, Functional tasks, Multimodal communication approach, SLP instruction and feedback, and Compensatory strategies   Weltha Cathy, CCC-SLP 04/16/2022, 12:41 AM

## 2022-04-17 ENCOUNTER — Encounter: Payer: Medicare Other | Admitting: Occupational Therapy

## 2022-04-17 ENCOUNTER — Ambulatory Visit: Payer: Medicare Other

## 2022-04-17 DIAGNOSIS — R41841 Cognitive communication deficit: Secondary | ICD-10-CM

## 2022-04-17 DIAGNOSIS — R471 Dysarthria and anarthria: Secondary | ICD-10-CM

## 2022-04-17 DIAGNOSIS — R278 Other lack of coordination: Secondary | ICD-10-CM | POA: Diagnosis not present

## 2022-04-17 DIAGNOSIS — R293 Abnormal posture: Secondary | ICD-10-CM | POA: Diagnosis not present

## 2022-04-17 DIAGNOSIS — R29818 Other symptoms and signs involving the nervous system: Secondary | ICD-10-CM | POA: Diagnosis not present

## 2022-04-17 DIAGNOSIS — R4701 Aphasia: Secondary | ICD-10-CM | POA: Diagnosis not present

## 2022-04-17 DIAGNOSIS — R2681 Unsteadiness on feet: Secondary | ICD-10-CM | POA: Diagnosis not present

## 2022-04-17 NOTE — Therapy (Signed)
OUTPATIENT SPEECH LANGUAGE PATHOLOGY TREATMENT NOTE   Patient Name: [name] [name] MRN: [id number] DOB:1951/04/17, 71 y.o., male Today's Date: 04/17/2022  PCP: [name], DO REFERRING PROVIDER: Loralie Champagne, DO  END OF SESSION:       Past Medical History:  Diagnosis Date   Cervical lymphadenopathy    right - followed by ENT   DOE (dyspnea on exertion) 07/15/2018   Dysphagia, neurologic 07/27/2014   Dystonia 1994   diagnosed in [place]   Enlarged lymph nodes 07/28/2007   Gastroesophageal reflux disease 03/07/2020   Localized swelling of right lower extremity 04/12/2019   Low back pain 11/21/2011   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   Mild neurocognitive disorder due to Parkinson's disease 01/10/2022   Non-Hodgkin lymphoma 2007   diffuse- 6 cycles of chemo with R CHOP Rituxan - last dose 10/08   Nonspecific elevation of levels of transaminase or lactic acid dehydrogenase (LDH) 07/06/2008   Parkinson's disease 07/27/2014   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   Past Surgical History:  Procedure Laterality Date   CHOLECYSTECTOMY, LAPAROSCOPIC     EYE SURGERY     x2 as child   PORT-A-CATH REMOVAL     PORTACATH PLACEMENT     SPLENECTOMY     TONSILLECTOMY     Patient Active Problem List   Diagnosis Date Noted   Mild neurocognitive disorder due to Parkinson's disease 01/10/2022   Gastroesophageal reflux disease 03/07/2020   Localized swelling of right lower extremity 04/12/2019   DOE (dyspnea on exertion) 07/15/2018   10 year risk of MI or stroke 7.5% or greater 04/12/2018   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 PAIN:  Are you having pain? No   OBJECTIVE:   TODAY'S TREATMENT:  04/17/22: Pt stated he had a long meeting and lunch proir to this session. Louder speech volume targeted today with loud /a/ (average mid 90s dB), and everyday sentences (average low-mid 80s dB). 90-second conversational segments were completed today; Pt fatigue played a negative role in practice today, as pt was sub-WNL for all segments and volume decr'd as segments progressed in number. Pt remained WNL loudness for average 25 seconds of each segment.   04/15/22: Pt also reported that during his [employer] class nobody asked him to repeat. Pt is making more opportunities each day for him to persist in muscle memory that is adequate for WNL volume speech. Explained/educated pt that he could perform as many as 8 loud /a/ at home BID. Louder speech volume targeted today with loud /a/ (average low 90s dB), and everyday sentences (average low 80s dB). One-minute conversational segments were completed today; In first segment, pt stayed WNL volume for 40 seconds and this increased to 45-50 seconds as the number of segments progressed. Pt to cont his homework from previous session.  04/11/22: Pt, along with "s" statement, stated he went to [place] and was not asked to repeat. Pt's  conversation was sub-WNL today, despite SLP cues occasionally to "shout". Louder speech volume targeted today with loud /a/ (average low-mid 90s dB), and everyday sentences (average low-mid 80s dB). In sentnece responses pt maintained WNL loudness in 16/20 sentences. SLP provided pt with multiple-sentence homework.  04/08/22: Increased speech volume  targeted today with loud /a/ (average lower 90s), and everyday sentences (average low-mid 80s dB). "I put them into my phone" pt stated today to SLP, so that he would not misplace them again. SLP asked pt about the four areas where he would like to see improvement with voice volume (Bible study, ordering at restaurants, at ball game, and at [place]). Pt usually gets broiled salmon, mashed potatoes, and broccoli at [employer] where he usually experiences difficulty with volume when ordering. SLP worked on this phrase with pt specifically today. SLP had pt repeat his usual order x2, with WNL volume (mid 70s dB). SLP then played cafeteria noise and role played with pt and pt intelligible 100%, at upper 60s - low 70s dB. Also simulated/role played with pt with ordering at drive thru; pt at upper 60s dB average. AFter SLP explained pt will need to think "shout" in order to "overshoot" the target to achieve WNL volume, pt hit lower 70s average x2.   04/02/22: SLP targeted increased vocal intensity this session. Pt reports that on average he completes 6 sustained /a/. Sustained /a/ (dB): averaged 92 dB.  -Functional sentences: 2 sets of 10 - First set (over the fence voice): 72 dB avg -Second set (authoritative): 71 dB avg with model   Completed a 4-min conversation about "the most favorite trip he's ever taken". Avg 60-65 dB  Pt reported he forgot to do his HEP and does not recall if he has the sentences at home. Provided with sentences in case he misplaced sentences at home. SLP also told pt he could read newspaper or whatever reading material he enjoys, sentence by sentence. Cont with current POC.  03/20/22: Louder speech was targeted this session with loud /a/ at average in low 90s, everyday sentences with average upper upper 70s - low 80s dB. With sentence level responses pt had average of mid 60s- upper 60s dB loudness. In multiple sentences, pt loudness dropped to low-mid 60s dB, even after encouragement  to incr volume.  Pt stated he has been 100% with his meds since previous session. Homework for sentence level reading and responses; Instructed pt to work 15-30 minutes BID.   PATIENT EDUCATION: Education details: see above in "today's treatment" Person educated: Patient Education method: Explanation and Handouts Education comprehension: verbalized understanding and downloaded app     HOME EXERCISE PROGRAM: See above in "today's treatment"     GOALS: Goals reviewed with patient? Yes   SHORT TERM GOALS: Target date: 04/01/2022     pt will produce loud /a/ with at least low 90s dB average over three sessions Baseline: 03/18/22, 03/20/22 Goal status: met   2.  Pt will produce 16/20 sentence responses with WNL volume over two sessions Baseline:  Goal status: Not met   3.  Pt will produce 5 minutes simple conversation with volume upper 60s dB average over three sessions Baseline:  Goal status: Not met   4.  pt will generate abdominal breathing 80% of the time when engaging in 5 minutes simple conversation in 2 sessions Baseline:  Goal status: Not met   5.  Pt will indicate 100% successful medication administration for 7 days using strategies Baseline:  Goal  status: Met     LONG TERM GOALS: Target date: 05/02/2022     Pt will score higher than 21/32 on CES in the final two ST sessions Baseline:  Goal status: Ongoing   2.  Pt will demo successful usage of anomia compensations PRN in 8 minutes mod complex conversation in 2 sessions Baseline:  Goal status: Deferred (no anomia observed in last 5 sessions)   3.  Pt will maintain average of upper 60s dB over 8 minute simple-mod complex conversation with rare min A in three sessions  Baseline:  Goal status: Revised   4.  pt will use abdominal breathing 75% of the time in 8 minutes simple-mod complex conversation in three sessions  Baseline:  Goal status: Revised   5.  Pt will indicate 100% successful medication  administration after 04/01/21 for 14 days, using strategies  Baseline:  Goal status: Ongoing     ASSESSMENT:   CLINICAL IMPRESSION: Patient is a 71 y.o. male who was seen today for treatment of speech clarity who demonstrated mod dysarthria and reduced breath support due to Parkinson's disease. Pt continues to see success in settings outside [place]. See tx note. Cont with current POC. Pt would benefit from cont'd skilled ST targeting loudness in conversation, anomia strategies, and developing simple memory strategy for med administration due to occasional accidental skipping of meds due to swiping or stopping alarms without taking medication. Pt's OT will target attention.    OBJECTIVE IMPAIRMENTS  Objective impairments include memory, aphasia, and dysarthria. These impairments are limiting patient from managing medications, household responsibilities, and effectively communicating at home and in community.Factors affecting potential to achieve goals and functional outcome are previous level of function and severity of impairments.. Patient will benefit from skilled SLP services to address above impairments and improve overall function.   REHAB POTENTIAL: Good   PLAN: SLP FREQUENCY: 2x/week   SLP DURATION: 8 weeks   PLANNED INTERVENTIONS: Language facilitation, Environmental controls, Cueing hierachy, Internal/external aids, Functional tasks, Multimodal communication approach, SLP instruction and feedback, and Compensatory strategies   ,, CCC-SLP 04/17/2022, 2:14 PM

## 2022-04-22 ENCOUNTER — Encounter: Payer: Medicare Other | Admitting: Occupational Therapy

## 2022-04-22 ENCOUNTER — Ambulatory Visit: Payer: Medicare Other | Attending: Neurology

## 2022-04-22 DIAGNOSIS — R471 Dysarthria and anarthria: Secondary | ICD-10-CM | POA: Insufficient documentation

## 2022-04-22 DIAGNOSIS — R4701 Aphasia: Secondary | ICD-10-CM | POA: Diagnosis not present

## 2022-04-22 DIAGNOSIS — R41841 Cognitive communication deficit: Secondary | ICD-10-CM | POA: Insufficient documentation

## 2022-04-22 NOTE — Therapy (Signed)
OUTPATIENT SPEECH LANGUAGE PATHOLOGY TREATMENT NOTE/Medicare progress note   Patient Name: [name] [name] MRN: [id number] DOB:[birth year], 71 y.o., male Today's Date: 04/22/2022  PCP: [name], DO REFERRING PROVIDER: Loralie Champagne, DO  END OF SESSION:   End of Session - 04/22/22 1415     Visit Number 10    Number of Visits 17    Date for SLP Re-Evaluation 05/02/22    SLP Start Time 1407   pt checked in 5 min after 2pm   SLP Stop Time  0932    SLP Time Calculation (min) 38 min    Activity Tolerance Patient tolerated treatment well                Past Medical History:  Diagnosis Date   Cervical lymphadenopathy    right - followed by ENT   DOE (dyspnea on exertion) 07/15/2018   Dysphagia, neurologic 07/27/2014   Dystonia 1994   diagnosed in [place]   Enlarged lymph nodes 07/28/2007   Gastroesophageal reflux disease 03/07/2020   Localized swelling of right lower extremity 04/12/2019   Low back pain 11/21/2011   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   Mild neurocognitive disorder due to Parkinson's disease 01/10/2022   Non-Hodgkin lymphoma 2007   diffuse- 6 cycles of chemo with R CHOP Rituxan - last dose 10/08   Nonspecific elevation of levels of transaminase or lactic acid dehydrogenase (LDH) 07/06/2008   Parkinson's disease 07/27/2014   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   Past Surgical History:  Procedure Laterality Date   CHOLECYSTECTOMY, LAPAROSCOPIC     EYE SURGERY     x2 as child   PORT-A-CATH REMOVAL     PORTACATH PLACEMENT     SPLENECTOMY     TONSILLECTOMY     Patient Active Problem List   Diagnosis Date Noted   Mild neurocognitive disorder due to Parkinson's disease 01/10/2022   Gastroesophageal reflux disease 03/07/2020   Localized swelling of right lower extremity 04/12/2019   DOE (dyspnea on exertion) 07/15/2018   10 year risk of MI or stroke 7.5% or greater 04/12/2018   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   Dysphagia, neurologic 07/27/2014   Parkinson's disease ([place]) 07/27/2014   Dystonia 07/10/2014   Low back pain 11/21/2011   Nonspecific elevation of levels of transaminase or lactic acid dehydrogenase (LDH) 07/06/2008   Enlarged lymph nodes 07/28/2007   Non-Hodgkin lymphoma 03/23/2007   Speech Therapy Progress Note  Dates of Reporting Period: 03/04/22 to present  Subjective Statement: Pt has been seen for 10 ST visits focusing on dysarthria/loudness and breath support. Anomia has not been noted in therapy sessions thus far and goals have been deferred.  Objective: See below.  Goal Update: See below.  Plan: Pt will decr to once/week for two weeks after this week, with likely d/c in 3-4 visits.  Reason Skilled Services are Required: Pt has not mastered consistency with incr'd loudness.    ONSET DATE: Dx in mid-2010s  REFERRING DIAG: G20 - Parkinson's disease   THERAPY DIAG:  Dysarthria and anarthria  Cognitive communication deficit  Aphasia  Rationale for Evaluation and Treatment Rehabilitation  SUBJECTIVE: "Sunday school went great. No complaints."   Pt is alone today. PAIN:  Are you having pain? No   OBJECTIVE:   TODAY'S TREATMENT:  04/22/22: SLP asked pt to rate his three target conversational opportutnities he wanted to improve his loudness in, which he provided SLP at beginning ot therapy course: drive thru/out  to eat, [employer], and at baseball games. Pt rated each 9-10 (where 10=near WNL).  Louder conversational speech volume targeted today with loud /a/ (average mid 90s dB), and everyday sentences (average low-mid 80s dB). Conversation today fluctuated between mid-upper 60s and low 70s dB. After this week, pt is comfortable with once week x2 weeks until d/c.   04/17/22: Pt stated he had a long meeting and lunch proir to this session. Louder speech volume targeted today with loud /a/  (average mid 90s dB), and everyday sentences (average low-mid 80s dB). 90-second conversational segments were completed today; Pt fatigue played a negative role in practice today, as pt was sub-WNL for all segments and volume decr'd as segments progressed in number. Pt remained WNL loudness for average 25 seconds of each segment.   04/15/22: Pt also reported that during his Sunday [employer] class nobody asked him to repeat. Pt is making more opportunities each [name] for him to persist in muscle memory that is adequate for WNL volume speech. Explained/educated pt that he could perform as many as 8 loud /a/ at home BID. Louder speech volume targeted today with loud /a/ (average low 90s dB), and everyday sentences (average low 80s dB). One-minute conversational segments were completed today; In first segment, pt stayed WNL volume for 40 seconds and this increased to 45-50 seconds as the number of segments progressed. Pt to cont his homework from previous session.  04/11/22: Pt, along with "s" statement, stated he went to [place] and was not asked to repeat. Pt's conversation was sub-WNL today, despite SLP cues occasionally to "shout". Louder speech volume targeted today with loud /a/ (average low-mid 90s dB), and everyday sentences (average low-mid 80s dB). In sentnece responses pt maintained WNL loudness in 16/20 sentences. SLP provided pt with multiple-sentence homework.  04/08/22: Increased speech volume targeted today with loud /a/ (average lower 90s), and everyday sentences (average low-mid 80s dB). "I put them into my phone" pt stated today to SLP, so that he would not misplace them again. SLP asked pt about the four areas where he would like to see improvement with voice volume (Bible study, ordering at restaurants, at ball game, and at [place]). Pt usually gets broiled salmon, mashed potatoes, and broccoli at [employer] where he usually experiences difficulty with volume when ordering. SLP worked on this  phrase with pt specifically today. SLP had pt repeat his usual order x2, with WNL volume (mid 70s dB). SLP then played cafeteria noise and role played with pt and pt intelligible 100%, at upper 60s - low 70s dB. Also simulated/role played with pt with ordering at drive thru; pt at upper 60s dB average. AFter SLP explained pt will need to think "shout" in order to "overshoot" the target to achieve WNL volume, pt hit lower 70s average x2.   04/02/22: SLP targeted increased vocal intensity this session. Pt reports that on average he completes 6 sustained /a/. Sustained /a/ (dB): averaged 92 dB.  -Functional sentences: 2 sets of 10 - First set (over the fence voice): 72 dB avg -Second set (authoritative): 71 dB avg with model   Completed a 4-min conversation about "the most favorite trip he's ever taken". Avg 60-65 dB  Pt reported he forgot to do his HEP and does not recall if he has the sentences at home. Provided with sentences in case he misplaced sentences at home. SLP also told pt he could read newspaper or whatever reading material he enjoys, sentence by sentence. Cont with current  POC.  PATIENT EDUCATION: Education details: see above in "today's treatment" Person educated: Patient Education method: Explanation and Handouts Education comprehension: verbalized understanding and downloaded app     HOME EXERCISE PROGRAM: See above in "today's treatment"     GOALS: Goals reviewed with patient? Yes   SHORT TERM GOALS: Target date: 04/01/2022     pt will produce loud /a/ with at least low 90s dB average over three sessions Baseline: 03/18/22, 03/20/22 Goal status: met   2.  Pt will produce 16/20 sentence responses with WNL volume over two sessions Baseline:  Goal status: Not met   3.  Pt will produce 5 minutes simple conversation with volume upper 60s dB average over three sessions Baseline:  Goal status: Not met   4.  pt will generate abdominal breathing 80% of the time when engaging  in 5 minutes simple conversation in 2 sessions Baseline:  Goal status: Not met   5.  Pt will indicate 100% successful medication administration for 7 days using strategies Baseline:  Goal status: Met     LONG TERM GOALS: Target date: 05/02/2022     Pt will score higher than 21/32 on CES in the final two ST sessions Baseline:  Goal status: Ongoing   2.  Pt will demo successful usage of anomia compensations PRN in 8 minutes mod complex conversation in 2 sessions Baseline:  Goal status: Deferred (no anomia observed in last 5 sessions)   3.  Pt will maintain average of upper 60s dB over 8 minute simple-mod complex conversation with rare min A in three sessions  Baseline: 04/22/22 Goal status: Revised   4.  pt will use abdominal breathing 75% of the time in 8 minutes simple-mod complex conversation in three sessions  Baseline:  Goal status: Revised   5.  Pt will indicate 100% successful medication administration after 04/01/21 for 14 days, using strategies  Baseline:  Goal status: Ongoing     ASSESSMENT:   CLINICAL IMPRESSION: Patient is a 71 y.o. male who was seen today for treatment of speech clarity whose dysarthria and reduced breath support due to Parkinson's disease are improving. Pt continues to see success in settings outside [place]. See tx note. Cont with current POC. Pt would benefit from cont'd skilled ST targeting loudness in conversation, anomia strategies, and developing simple memory strategy for med administration due to occasional accidental skipping of meds due to swiping or stopping alarms without taking medication. Pt's OT will target attention. He will decr to once/week after this week.    OBJECTIVE IMPAIRMENTS  Objective impairments include memory, aphasia, and dysarthria. These impairments are limiting patient from managing medications, household responsibilities, and effectively communicating at home and in community.Factors affecting potential to achieve goals and  functional outcome are previous level of function and severity of impairments.. Patient will benefit from skilled SLP services to address above impairments and improve overall function.   REHAB POTENTIAL: Good   PLAN: SLP FREQUENCY: 2x/week   SLP DURATION: 8 weeks   PLANNED INTERVENTIONS: Language facilitation, Environmental controls, Cueing hierachy, Internal/external aids, Functional tasks, Multimodal communication approach, SLP instruction and feedback, and Compensatory strategies   Raza Bayless, CCC-SLP 04/22/2022, 2:15 PM

## 2022-04-24 ENCOUNTER — Ambulatory Visit: Payer: Medicare Other

## 2022-04-24 ENCOUNTER — Encounter: Payer: Medicare Other | Admitting: Occupational Therapy

## 2022-04-24 DIAGNOSIS — R471 Dysarthria and anarthria: Secondary | ICD-10-CM

## 2022-04-24 DIAGNOSIS — R41841 Cognitive communication deficit: Secondary | ICD-10-CM | POA: Diagnosis not present

## 2022-04-24 DIAGNOSIS — R4701 Aphasia: Secondary | ICD-10-CM

## 2022-04-24 NOTE — Therapy (Signed)
OUTPATIENT SPEECH LANGUAGE PATHOLOGY TREATMENT NOTE/Medicare progress note   Patient Name: [name] [name] MRN: [id number] DOB:30-Jan-1951, 71 y.o., male Today's Date: 04/24/2022  PCP: [name], DO REFERRING PROVIDER: Loralie Champagne, DO  END OF SESSION:   End of Session - 04/24/22 1538     Visit Number 11    Number of Visits 17    Date for SLP Re-Evaluation 05/02/22    SLP Start Time 45    SLP Stop Time  1615    SLP Time Calculation (min) 41 min    Activity Tolerance Patient tolerated treatment well                Past Medical History:  Diagnosis Date   Cervical lymphadenopathy    right - followed by ENT   DOE (dyspnea on exertion) 07/15/2018   Dysphagia, neurologic 07/27/2014   Dystonia 1994   diagnosed in [place]   Enlarged lymph nodes 07/28/2007   Gastroesophageal reflux disease 03/07/2020   Localized swelling of right lower extremity 04/12/2019   Low back pain 11/21/2011   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   Mild neurocognitive disorder due to Parkinson's disease 01/10/2022   Non-Hodgkin lymphoma 2007   diffuse- 6 cycles of chemo with R CHOP Rituxan - last dose 10/08   Nonspecific elevation of levels of transaminase or lactic acid dehydrogenase (LDH) 07/06/2008   Parkinson's disease 07/27/2014   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   Past Surgical History:  Procedure Laterality Date   CHOLECYSTECTOMY, LAPAROSCOPIC     EYE SURGERY     x2 as child   PORT-A-CATH REMOVAL     PORTACATH PLACEMENT     SPLENECTOMY     TONSILLECTOMY     Patient Active Problem List   Diagnosis Date Noted   Mild neurocognitive disorder due to Parkinson's disease 01/10/2022   Gastroesophageal reflux disease 03/07/2020   Localized swelling of right lower extremity 04/12/2019   DOE (dyspnea on exertion) 07/15/2018   10 year risk of MI or stroke 7.5% or greater 04/12/2018   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   Meige syndrome (blepharospasm  with oromandibular dystonia) 07/27/2014   Dysphagia, neurologic 07/27/2014   Parkinson's disease ([place]) 07/27/2014   Dystonia 07/10/2014   Low back pain 11/21/2011   Nonspecific elevation of levels of transaminase or lactic acid dehydrogenase (LDH) 07/06/2008   Enlarged lymph nodes 07/28/2007   Non-Hodgkin lymphoma 03/23/2007    ONSET DATE: Dx in mid-2010s  REFERRING DIAG: G20 - Parkinson's disease   THERAPY DIAG:  Dysarthria and anarthria  Cognitive communication deficit  Aphasia  Rationale for Evaluation and Treatment Rehabilitation  SUBJECTIVE: "Sunday school went great. No complaints."   Pt is alone today. PAIN:  Are you having pain? No   OBJECTIVE:   TODAY'S TREATMENT:  04/24/22:Louder conversational speech volume targeted today with loud /a/ (average mid 90s dB), and everyday sentences (average low 80s dB). In conversation today pt averaged upper 60's - low 70s. SLP took pt outdoors and with a highly interesting topic to pt (amateur radio), he had more rushing of speech, but was intelligible. Volume remained adequate to hear in that min-mod noisy environment.    04/22/22: SLP asked pt to rate his three target conversational opportutnities he wanted to improve his loudness in, which he provided SLP at beginning ot therapy course: drive thru/out to eat, [employer], and at baseball games. Pt rated each 9-10 (where 10=near WNL).  Louder conversational speech volume targeted today with loud /a/ (average mid 90s dB), and  everyday sentences (average low-mid 80s dB). Conversation today fluctuated between mid-upper 60s and low 70s dB. After this week, pt is comfortable with once week x2 weeks until d/c.   04/17/22: Pt stated he had a long meeting and lunch proir to this session. Louder speech volume targeted today with loud /a/ (average mid 90s dB), and everyday sentences (average low-mid 80s dB). 90-second conversational segments were completed today; Pt fatigue played a negative  role in practice today, as pt was sub-WNL for all segments and volume decr'd as segments progressed in number. Pt remained WNL loudness for average 25 seconds of each segment.   04/15/22: Pt also reported that during his [employer] class nobody asked him to repeat. Pt is making more opportunities each day for him to persist in muscle memory that is adequate for WNL volume speech. Explained/educated pt that he could perform as many as 8 loud /a/ at home BID. Louder speech volume targeted today with loud /a/ (average low 90s dB), and everyday sentences (average low 80s dB). One-minute conversational segments were completed today; In first segment, pt stayed WNL volume for 40 seconds and this increased to 45-50 seconds as the number of segments progressed. Pt to cont his homework from previous session.  04/11/22: Pt, along with "s" statement, stated he went to [place] and was not asked to repeat. Pt's conversation was sub-WNL today, despite SLP cues occasionally to "shout". Louder speech volume targeted today with loud /a/ (average low-mid 90s dB), and everyday sentences (average low-mid 80s dB). In sentnece responses pt maintained WNL loudness in 16/20 sentences. SLP provided pt with multiple-sentence homework.  04/08/22: Increased speech volume targeted today with loud /a/ (average lower 90s), and everyday sentences (average low-mid 80s dB). "I put them into my phone" pt stated today to SLP, so that he would not misplace them again. SLP asked pt about the four areas where he would like to see improvement with voice volume (Bible study, ordering at restaurants, at ball game, and at [place]). Pt usually gets broiled salmon, mashed potatoes, and broccoli at [employer] where he usually experiences difficulty with volume when ordering. SLP worked on this phrase with pt specifically today. SLP had pt repeat his usual order x2, with WNL volume (mid 70s dB). SLP then played cafeteria noise and role played with pt  and pt intelligible 100%, at upper 60s - low 70s dB. Also simulated/role played with pt with ordering at drive thru; pt at upper 60s dB average. AFter SLP explained pt will need to think "shout" in order to "overshoot" the target to achieve WNL volume, pt hit lower 70s average x2.    PATIENT EDUCATION: Education details: see above in "today's treatment" Person educated: Patient Education method: Explanation and Handouts Education comprehension: verbalized understanding and downloaded app     HOME EXERCISE PROGRAM: See above in "today's treatment"     GOALS: Goals reviewed with patient? Yes   SHORT TERM GOALS: Target date: 04/01/2022     pt will produce loud /a/ with at least low 90s dB average over three sessions Baseline: 03/18/22, 03/20/22 Goal status: met   2.  Pt will produce 16/20 sentence responses with WNL volume over two sessions Baseline:  Goal status: Not met   3.  Pt will produce 5 minutes simple conversation with volume upper 60s dB average over three sessions Baseline:  Goal status: Not met   4.  pt will generate abdominal breathing 80% of the time when engaging in 5 minutes  simple conversation in 2 sessions Baseline:  Goal status: Not met   5.  Pt will indicate 100% successful medication administration for 7 days using strategies Baseline:  Goal status: Met     LONG TERM GOALS: Target date: 05/02/2022     Pt will score higher than 21/32 on CES in the final two ST sessions Baseline:  Goal status: Ongoing   2.  Pt will demo successful usage of anomia compensations PRN in 8 minutes mod complex conversation in 2 sessions Baseline:  Goal status: Deferred (no anomia observed in last 5 sessions)   3.  Pt will maintain average of upper 60s dB over 8 minute simple-mod complex conversation with rare min A in three sessions  Baseline: 04/22/22, 04/24/22 Goal status: Revised   4.  pt will use abdominal breathing 75% of the time in 8 minutes simple-mod complex  conversation in three sessions  Baseline: 04/24/22 Goal status: Revised   5.  Pt will indicate 100% successful medication administration after 04/01/21 for 14 days, using strategies  Baseline:  Goal status: Ongoing     ASSESSMENT:   CLINICAL IMPRESSION: Patient is a 71 y.o. male who was seen today for treatment of speech clarity whose dysarthria and reduced breath support due to Parkinson's disease are improving. Pt continues to see success in settings outside [place]. See tx note. Cont with current POC. Pt would benefit from cont'd skilled ST targeting loudness in conversation, anomia strategies, and developing simple memory strategy for med administration due to occasional accidental skipping of meds due to swiping or stopping alarms without taking medication. Pt's OT will target attention. He will decr to once/week after this week.    OBJECTIVE IMPAIRMENTS  Objective impairments include memory, aphasia, and dysarthria. These impairments are limiting patient from managing medications, household responsibilities, and effectively communicating at home and in community.Factors affecting potential to achieve goals and functional outcome are previous level of function and severity of impairments.. Patient will benefit from skilled SLP services to address above impairments and improve overall function.   REHAB POTENTIAL: Good   PLAN: SLP FREQUENCY: 2x/week   SLP DURATION: 8 weeks   PLANNED INTERVENTIONS: Language facilitation, Environmental controls, Cueing hierachy, Internal/external aids, Functional tasks, Multimodal communication approach, SLP instruction and feedback, and Compensatory strategies   Shacoya Burkhammer, CCC-SLP 04/24/2022, 4:18 PM

## 2022-04-29 ENCOUNTER — Ambulatory Visit: Payer: Medicare Other

## 2022-05-07 ENCOUNTER — Ambulatory Visit: Payer: Medicare Other

## 2022-05-07 DIAGNOSIS — R41841 Cognitive communication deficit: Secondary | ICD-10-CM

## 2022-05-07 DIAGNOSIS — R471 Dysarthria and anarthria: Secondary | ICD-10-CM

## 2022-05-07 DIAGNOSIS — R4701 Aphasia: Secondary | ICD-10-CM | POA: Diagnosis not present

## 2022-05-07 NOTE — Therapy (Signed)
OUTPATIENT SPEECH LANGUAGE PATHOLOGY TREATMENT NOTE/Discharge summary  Patient Name: [name] MRN: [id number] DOB:1951-06-03, 71 y.o., male Today's Date: 05/07/2022  PCP: [name], DO REFERRING PROVIDER: Loralie Champagne, DO  END OF SESSION:   End of Session - 05/07/22 1646     Visit Number 12    Number of Visits 17    Date for SLP Re-Evaluation 05/02/22    SLP Start Time 1535    SLP Stop Time  1616    SLP Time Calculation (min) 41 min    Activity Tolerance Patient tolerated treatment well                 Past Medical History:  Diagnosis Date   Cervical lymphadenopathy    right - followed by ENT   DOE (dyspnea on exertion) 07/15/2018   Dysphagia, neurologic 07/27/2014   Dystonia 1994   diagnosed in [place]   Enlarged lymph nodes 07/28/2007   Gastroesophageal reflux disease 03/07/2020   Localized swelling of right lower extremity 04/12/2019   Low back pain 11/21/2011   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   Mild neurocognitive disorder due to Parkinson's disease 01/10/2022   Non-Hodgkin lymphoma 2007   diffuse- 6 cycles of chemo with R CHOP Rituxan - last dose 10/08   Nonspecific elevation of levels of transaminase or lactic acid dehydrogenase (LDH) 07/06/2008   Parkinson's disease 07/27/2014   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   Past Surgical History:  Procedure Laterality Date   CHOLECYSTECTOMY, LAPAROSCOPIC     EYE SURGERY     x2 as child   PORT-A-CATH REMOVAL     PORTACATH PLACEMENT     SPLENECTOMY     TONSILLECTOMY     Patient Active Problem List   Diagnosis Date Noted   Mild neurocognitive disorder due to Parkinson's disease 01/10/2022   Gastroesophageal reflux disease 03/07/2020   Localized swelling of right lower extremity 04/12/2019   DOE (dyspnea on exertion) 07/15/2018   10 year risk of MI or stroke 7.5% or greater 04/12/2018   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   Meige syndrome (blepharospasm with  oromandibular dystonia) 07/27/2014   Dysphagia, neurologic 07/27/2014   Parkinson's disease ([place]) 07/27/2014   Dystonia 07/10/2014   Low back pain 11/21/2011   Nonspecific elevation of levels of transaminase or lactic acid dehydrogenase (LDH) 07/06/2008   Enlarged lymph nodes 07/28/2007   Non-Hodgkin lymphoma 03/23/2007    SPEECH THERAPY DISCHARGE SUMMARY  Visits from Start of Care: 12  Current functional level related to goals / functional outcomes: See below.   Remaining deficits: Mild intermittent dysarthria c/b hypophonia and imprecise consonants.   Education / Equipment: Necessity of HEP daily.   Patient agrees to discharge. Patient goals were partially met. Patient is being discharged due to being pleased with the current functional level., and meeting most long term therapy goals.     ONSET DATE: Dx in mid-2010s  REFERRING DIAG: G20 - Parkinson's disease   THERAPY DIAG:  Dysarthria and anarthria  Cognitive communication deficit  Aphasia  Rationale for Evaluation and Treatment Rehabilitation  SUBJECTIVE: "Sunday school went great. No complaints."   Pt is alone today. PAIN:  Are you having pain? No   OBJECTIVE:   TODAY'S TREATMENT:  05/07/22: Louder conversational speech volume targeted today with loud /a/ (average mid 90s dB), and everyday sentences (average low 80s dB). In session, pt maintained loudness in upper 60s dB in 9 minutes mod complex conversation. Outdoors today for 20 minute min-mod complex conversation pt maintained a  volume able to be heard over yard work noise. He agreed d/c was appropriate at this time. Pt scored a 29/32 on the Communication Effectiveness Survey.  04/24/22:Louder conversational speech volume targeted today with loud /a/ (average mid 90s dB), and everyday sentences (average low 80s dB). In conversation today pt averaged upper 60's - low 70s. SLP took pt outdoors and with a highly interesting topic to pt (amateur radio), he had  more rushing of speech, but was intelligible. Volume remained adequate to hear in that min-mod noisy environment.    04/22/22: SLP asked pt to rate his three target conversational opportutnities he wanted to improve his loudness in, which he provided SLP at beginning ot therapy course: drive thru/out to eat, [employer], and at baseball games. Pt rated each 9-10 (where 10=near WNL).  Louder conversational speech volume targeted today with loud /a/ (average mid 90s dB), and everyday sentences (average low-mid 80s dB). Conversation today fluctuated between mid-upper 60s and low 70s dB. After this week, pt is comfortable with once week x2 weeks until d/c.   04/17/22: Pt stated he had a long meeting and lunch proir to this session. Louder speech volume targeted today with loud /a/ (average mid 90s dB), and everyday sentences (average low-mid 80s dB). 90-second conversational segments were completed today; Pt fatigue played a negative role in practice today, as pt was sub-WNL for all segments and volume decr'd as segments progressed in number. Pt remained WNL loudness for average 25 seconds of each segment.   04/15/22: Pt also reported that during his Sunday School class nobody asked him to repeat. Pt is making more opportunities each day for him to persist in muscle memory that is adequate for WNL volume speech. Explained/educated pt that he could perform as many as 8 loud /a/ at home BID. Louder speech volume targeted today with loud /a/ (average low 90s dB), and everyday sentences (average low 80s dB). One-minute conversational segments were completed today; In first segment, pt stayed WNL volume for 40 seconds and this increased to 45-50 seconds as the number of segments progressed. Pt to cont his homework from previous session.  04/11/22: Pt, along with "s" statement, stated he went to [place] and was not asked to repeat. Pt's conversation was sub-WNL today, despite SLP cues occasionally to "shout".  Louder speech volume targeted today with loud /a/ (average low-mid 90s dB), and everyday sentences (average low-mid 80s dB). In sentnece responses pt maintained WNL loudness in 16/20 sentences. SLP provided pt with multiple-sentence homework.  04/08/22: Increased speech volume targeted today with loud /a/ (average lower 90s), and everyday sentences (average low-mid 80s dB). "I put them into my phone" pt stated today to SLP, so that he would not misplace them again. SLP asked pt about the four areas where he would like to see improvement with voice volume (Bible study, ordering at restaurants, at ball game, and at [place]). Pt usually gets broiled salmon, mashed potatoes, and broccoli at [employer] where he usually experiences difficulty with volume when ordering. SLP worked on this phrase with pt specifically today. SLP had pt repeat his usual order x2, with WNL volume (mid 70s dB). SLP then played cafeteria noise and role played with pt and pt intelligible 100%, at upper 60s - low 70s dB. Also simulated/role played with pt with ordering at drive thru; pt at upper 60s dB average. AFter SLP explained pt will need to think "shout" in order to "overshoot" the target to achieve WNL volume, pt hit  lower 70s average x2.    PATIENT EDUCATION: Education details: see above in "today's treatment" Person educated: Patient Education method: Explanation and Handouts Education comprehension: verbalized understanding and downloaded app     HOME EXERCISE PROGRAM: See above in "today's treatment"     GOALS: Goals reviewed with patient? Yes   SHORT TERM GOALS: Target date: 04/01/2022     pt will produce loud /a/ with at least low 90s dB average over three sessions Baseline: 03/18/22, 03/20/22 Goal status: met   2.  Pt will produce 16/20 sentence responses with WNL volume over two sessions Baseline:  Goal status: Not met   3.  Pt will produce 5 minutes simple conversation with volume upper 60s dB average over  three sessions Baseline:  Goal status: Not met   4.  pt will generate abdominal breathing 80% of the time when engaging in 5 minutes simple conversation in 2 sessions Baseline:  Goal status: Not met   5.  Pt will indicate 100% successful medication administration for 7 days using strategies Baseline:  Goal status: Met     LONG TERM GOALS: Target date: 05/02/2022     Pt will score higher than 21/32 on CES in the final two ST sessions Baseline:  Goal status: Met   2.  Pt will demo successful usage of anomia compensations PRN in 8 minutes mod complex conversation in 2 sessions Baseline:  Goal status: Deferred (no anomia observed in last 5 sessions)   3.  Pt will maintain average of upper 60s dB over 8 minute simple-mod complex conversation with rare min A in three sessions  Baseline: 04/22/22, 04/24/22 Goal status: Met   4.  pt will use abdominal breathing 75% of the time in 8 minutes simple-mod complex conversation in three sessions  Baseline: 04/24/22, 05-07-22 Goal status: Partially met   5.  Pt will indicate 100% successful medication administration after 04/01/21 for 14 days, using strategies  Baseline:  Goal status: Met     ASSESSMENT:   CLINICAL IMPRESSION: Patient is a 71 y.o. male who was seen today for treatment of speech clarity whose dysarthria and reduced breath support due to Parkinson's disease. Pt maintains he has demonstrated speech able to be heard in multiple situations since previous visit. Pt continues to see success in settings outside [place]. See tx note.  Pt agreed with d/c today.    OBJECTIVE IMPAIRMENTS  Objective impairments include memory, aphasia, and dysarthria. These impairments are limiting patient from managing medications, household responsibilities, and effectively communicating at home and in community.Factors affecting potential to achieve goals and functional outcome are previous level of function and severity of impairments.. Patient will benefit from  skilled SLP services to address above impairments and improve overall function.   REHAB POTENTIAL: Good   PLAN: D/C   PLANNED INTERVENTIONS: Language facilitation, Environmental controls, Cueing hierachy, Internal/external aids, Functional tasks, Multimodal communication approach, SLP instruction and feedback, and Compensatory strategies   Chetan Mehring, CCC-SLP 05/07/2022, 4:47 PM

## 2022-07-04 ENCOUNTER — Encounter: Payer: Self-pay | Admitting: Family Medicine

## 2022-07-04 ENCOUNTER — Ambulatory Visit (INDEPENDENT_AMBULATORY_CARE_PROVIDER_SITE_OTHER): Payer: Medicare Other | Admitting: Family Medicine

## 2022-07-04 VITALS — BP 120/68 | HR 79 | Temp 97.7°F | Resp 16 | Ht 66.0 in | Wt 214.4 lb

## 2022-07-04 DIAGNOSIS — L03116 Cellulitis of left lower limb: Secondary | ICD-10-CM | POA: Diagnosis not present

## 2022-07-04 DIAGNOSIS — Z23 Encounter for immunization: Secondary | ICD-10-CM | POA: Diagnosis not present

## 2022-07-04 MED ORDER — DOXYCYCLINE HYCLATE 100 MG PO TABS: 100.0000 mg | ORAL_TABLET | Freq: Two times a day (BID) | ORAL | 0 refills | Status: DC

## 2022-07-04 NOTE — Patient Instructions (Signed)
Ice/cold pack over area for 10-15 min twice daily.  OK to take Tylenol 1000 mg (2 extra strength tabs) or 975 mg (3 regular strength tabs) every 6 hours as needed.  When you do wash it, use only soap and water. Do not vigorously scrub. Keep the area clean and dry.   Things to look out for: increasing pain not relieved by ibuprofen/acetaminophen, fevers, spreading redness, drainage of pus, or foul odor.  Let us know if you need anything.

## 2022-07-04 NOTE — Progress Notes (Signed)
Chief Complaint  Patient presents with   Knee Injury    Here for knee injury, Pt had fall    BIFF RUTIGLIANO is a 71 y.o. male here for a skin complaint.  Duration: 8 days Location: LLE Pruritic? No Painful? Yes Drainage? No Fell on bathroom tile, started having redness and worsening pain 2 days ago Other associated symptoms: spreading redness, no fevers Therapies tried thus far: TAO, Tylenol  Past Medical History:  Diagnosis Date   Cervical lymphadenopathy    right - followed by ENT   DOE (dyspnea on exertion) 07/15/2018   Dysphagia, neurologic 07/27/2014   Dystonia 1994   diagnosed in [place]   Enlarged lymph nodes 07/28/2007   Gastroesophageal reflux disease 03/07/2020   Localized swelling of right lower extremity 04/12/2019   Low back pain 11/21/2011   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   Mild neurocognitive disorder due to Parkinson's disease 01/10/2022   Non-Hodgkin lymphoma 2007   diffuse- 6 cycles of chemo with R CHOP Rituxan - last dose 10/08   Nonspecific elevation of levels of transaminase or lactic acid dehydrogenase (LDH) 07/06/2008   Parkinson's disease 07/27/2014   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    BP 120/68 (BP Location: Right Arm, Patient Position: Sitting, Cuff Size: Normal)   Pulse 79   Temp 97.7 F (36.5 C) (Oral)   Resp 16   Ht '5\' 6"'$  (1.676 m)   Wt 214 lb 6.4 oz (97.3 kg)   SpO2 95%   BMI 34.61 kg/m  Gen: awake, alert, appearing stated age Lungs: No accessory muscle use Skin: See below. + TTP and erythema, excoriation over the left knee no drainage, fluctuance, induration Psych: Age appropriate judgment and insight     Cellulitis of left lower extremity - Plan: doxycycline (VIBRA-TABS) 100 MG tablet  Need for influenza vaccination - Plan: Flu Vaccine QUAD High Dose(Fluad)  7-day course of doxycycline 100 mg twice daily.  Warning signs and symptoms verbalized and written down.  Ice, Tylenol, keep clean and  dry. The patient voiced understanding and agreement to the plan.  [place], DO 07/04/22 11:54 AM

## 2022-07-11 ENCOUNTER — Ambulatory Visit (INDEPENDENT_AMBULATORY_CARE_PROVIDER_SITE_OTHER): Payer: Medicare Other | Admitting: Family Medicine

## 2022-07-11 ENCOUNTER — Encounter: Payer: Self-pay | Admitting: Family Medicine

## 2022-07-11 VITALS — BP 108/62 | HR 78 | Temp 98.4°F | Ht 66.0 in | Wt 214.0 lb

## 2022-07-11 DIAGNOSIS — H6121 Impacted cerumen, right ear: Secondary | ICD-10-CM | POA: Diagnosis not present

## 2022-07-11 DIAGNOSIS — M25562 Pain in left knee: Secondary | ICD-10-CM | POA: Diagnosis not present

## 2022-07-11 NOTE — Patient Instructions (Addendum)
Ice/cold pack over area for 10-15 min twice daily at least. No more than once per hour.  OK to take Tylenol 1000 mg (2 extra strength tabs) or 975 mg (3 regular strength tabs) every 6 hours as needed.  Elevate your legs. Mind the salt intake.   Wear your compression as often as possible.  OK to use Debrox (peroxide) in the ear to loosen up wax. Also recommend using a bulb syringe (for removing boogers from baby's noses) to flush through warm water and vinegar (3-4:1 ratio). An alternative, though more expensive, is an elephant ear washer wax removal kit. Do not use Q-tips as this can impact wax further.  Let us know if you need anything.

## 2022-07-11 NOTE — Progress Notes (Signed)
Chief Complaint  Patient presents with   Knee Injury   Ear Fullness    Subjective: Patient is a 71 y.o. male here for follow-up.  He is here with his wife.  Patient was seen 1 week ago after a fall onto his left knee.  He had swelling, redness and pain.  He was placed on 7 days of doxycycline.  His last dose was today.  He reports compliance.  His pain is improved, swelling in the lower extremity remains.  He has been elevating his legs and he has over-the-counter compression stockings.  His leg swelling at baseline but this is worse due to the fall.  There is less redness around the area on his left knee.  His wife wonders if he tore his meniscus as she did something similar after a fall several years ago.  Past Medical History:  Diagnosis Date   Cervical lymphadenopathy    right - followed by ENT   DOE (dyspnea on exertion) 07/15/2018   Dysphagia, neurologic 07/27/2014   Dystonia 1994   diagnosed in [place]   Enlarged lymph nodes 07/28/2007   Gastroesophageal reflux disease 03/07/2020   Localized swelling of right lower extremity 04/12/2019   Low back pain 11/21/2011   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   Mild neurocognitive disorder due to Parkinson's disease 01/10/2022   Non-Hodgkin lymphoma 2007   diffuse- 6 cycles of chemo with R CHOP Rituxan - last dose 10/08   Nonspecific elevation of levels of transaminase or lactic acid dehydrogenase (LDH) 07/06/2008   Parkinson's disease 07/27/2014   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    Objective: BP 108/62 (BP Location: Left Arm, Patient Position: Sitting, Cuff Size: Normal)   Pulse 78   Temp 98.4 F (36.9 C) (Oral)   Ht '5\' 6"'$  (1.676 m)   Wt 214 lb (97.1 kg)   SpO2 97%   BMI 34.54 kg/m  General: Awake, appears stated age MSK: Normal active and passive range of motion of the knee, there is some joint line tenderness but that is also made over the area where his trauma was, negative Apley grind,  McMurray's, valgus/varus stress, patellar apprehension/grind, Lachman's; negative Homans, no TTP over the left calf Skin: Darker pink hue around the medial left knee anteriorly with mild TTP and central excoriation.  There is soft tissue swelling noted with pitting. Heart: 2+ pitting edema on the right tapering at the proximal third of the tibia, 3+ pitting edema on the left tapering at the knee Ears: Right canal 100% obstructed with cerumen, left canal is patent, TM negative Lungs: No accessory muscle use Psych: Age appropriate judgment and insight, normal affect and mood  Assessment and Plan: Left knee pain, unspecified chronicity  Impacted cerumen of right ear  I think this is related to trauma.  Ice, elevation, compression stocking form given today.  I do not think he needs any further antibiotics or incision and drainage.  If he does not improve, will refer to sports medicine. Ear irrigation today.  Home care instructions verbalized and written down as well. The patient and his spouse voiced understanding and agreement to the plan.  [place], DO 07/11/22  10:43 AM

## 2022-07-19 ENCOUNTER — Other Ambulatory Visit: Payer: Self-pay | Admitting: Neurology

## 2022-07-21 ENCOUNTER — Other Ambulatory Visit: Payer: Self-pay | Admitting: Neurology

## 2022-07-21 NOTE — Telephone Encounter (Signed)
Enough given until appt 10/09/2022 with Dr.Rebecca Tat, last seen 04/08/2022.

## 2022-07-29 ENCOUNTER — Encounter: Payer: Self-pay | Admitting: Neurology

## 2022-08-07 ENCOUNTER — Ambulatory Visit (INDEPENDENT_AMBULATORY_CARE_PROVIDER_SITE_OTHER): Payer: Medicare Other | Admitting: *Deleted

## 2022-08-07 DIAGNOSIS — Z0000 Encounter for general adult medical examination without abnormal findings: Secondary | ICD-10-CM | POA: Diagnosis not present

## 2022-08-07 NOTE — Patient Instructions (Signed)
[name] , Thank you for taking time to come for your Medicare Wellness Visit. I appreciate your ongoing commitment to your health goals. Please review the following plan we discussed and let me know if I can assist you in the future.   These are the goals we discussed:  Goals   None     This is a list of the screening recommended for you and due dates:  Health Maintenance  Topic Date Due   Zoster (Shingles) Vaccine (1 of 2) Never done   COVID-19 Vaccine (5 - Pfizer risk series) 01/03/2023*   Medicare Annual Wellness Visit  08/08/2023   Colon Cancer Screening  06/04/2025   Tetanus Vaccine  06/20/2026   Pneumonia Vaccine  Completed   Flu Shot  Completed   Hepatitis C Screening: USPSTF Recommendation to screen - Ages 18-79 yo.  Completed   HPV Vaccine  Aged Out  *Topic was postponed. The date shown is not the original due date.     Next appointment: Follow up in one year for your annual wellness visit.   Preventive Care 74 Years and Older, Male Preventive care refers to lifestyle choices and visits with your health care provider that can promote health and wellness. What does preventive care include? A yearly physical exam. This is also called an annual well check. Dental exams once or twice a year. Routine eye exams. Ask your health care provider how often you should have your eyes checked. Personal lifestyle choices, including: Daily care of your teeth and gums. Regular physical activity. Eating a healthy diet. Avoiding tobacco and drug use. Limiting alcohol use. Practicing safe sex. Taking low doses of aspirin every day. Taking vitamin and mineral supplements as recommended by your health care provider. What happens during an annual well check? The services and screenings done by your health care provider during your annual well check will depend on your age, overall health, lifestyle risk factors, and family history of disease. Counseling  Your health care provider  may ask you questions about your: Alcohol use. Tobacco use. Drug use. Emotional well-being. Home and relationship well-being. Sexual activity. Eating habits. History of falls. Memory and ability to understand (cognition). Work and work Statistician. Screening  You may have the following tests or measurements: Height, weight, and BMI. Blood pressure. Lipid and cholesterol levels. These may be checked every 5 years, or more frequently if you are over 73 years old. Skin check. Lung cancer screening. You may have this screening every year starting at age 36 if you have a 30-pack-year history of smoking and currently smoke or have quit within the past 15 years. Fecal occult blood test (FOBT) of the stool. You may have this test every year starting at age 74. Flexible sigmoidoscopy or colonoscopy. You may have a sigmoidoscopy every 5 years or a colonoscopy every 10 years starting at age 8. Prostate cancer screening. Recommendations will vary depending on your family history and other risks. Hepatitis C blood test. Hepatitis B blood test. Sexually transmitted disease (STD) testing. Diabetes screening. This is done by checking your blood sugar (glucose) after you have not eaten for a while (fasting). You may have this done every 1-3 years. Abdominal aortic aneurysm (AAA) screening. You may need this if you are a current or former smoker. Osteoporosis. You may be screened starting at age 73 if you are at high risk. Talk with your health care provider about your test results, treatment options, and if necessary, the need for more tests. Vaccines  Your health care provider may recommend certain vaccines, such as: Influenza vaccine. This is recommended every year. Tetanus, diphtheria, and acellular pertussis (Tdap, Td) vaccine. You may need a Td booster every 10 years. Zoster vaccine. You may need this after age 54. Pneumococcal 13-valent conjugate (PCV13) vaccine. One dose is recommended after  age 77. Pneumococcal polysaccharide (PPSV23) vaccine. One dose is recommended after age 77. Talk to your health care provider about which screenings and vaccines you need and how often you need them. This information is not intended to replace advice given to you by your health care provider. Make sure you discuss any questions you have with your health care provider. Document Released: 10/05/2015 Document Revised: 05/28/2016 Document Reviewed: 07/10/2015 Elsevier Interactive Patient Education  2017 [employer] Prevention in the Home Falls can cause injuries. They can happen to people of all ages. There are many things you can do to make your home safe and to help prevent falls. What can I do on the outside of my home? Regularly fix the edges of walkways and driveways and fix any cracks. Remove anything that might make you trip as you walk through a door, such as a raised step or threshold. Trim any bushes or trees on the path to your home. Use bright outdoor lighting. Clear any walking paths of anything that might make someone trip, such as rocks or tools. Regularly check to see if handrails are loose or broken. Make sure that both sides of any steps have handrails. Any raised decks and porches should have guardrails on the edges. Have any leaves, snow, or ice cleared regularly. Use sand or salt on walking paths during winter. Clean up any spills in your garage right away. This includes oil or grease spills. What can I do in the bathroom? Use night lights. Install grab bars by the toilet and in the tub and shower. Do not use towel bars as grab bars. Use non-skid mats or decals in the tub or shower. If you need to sit down in the shower, use a plastic, non-slip stool. Keep the floor dry. Clean up any water that spills on the floor as soon as it happens. Remove soap buildup in the tub or shower regularly. Attach bath mats securely with double-sided non-slip rug tape. Do not have  throw rugs and other things on the floor that can make you trip. What can I do in the bedroom? Use night lights. Make sure that you have a light by your bed that is easy to reach. Do not use any sheets or blankets that are too big for your bed. They should not hang down onto the floor. Have a firm chair that has side arms. You can use this for support while you get dressed. Do not have throw rugs and other things on the floor that can make you trip. What can I do in the kitchen? Clean up any spills right away. Avoid walking on wet floors. Keep items that you use a lot in easy-to-reach places. If you need to reach something above you, use a strong step stool that has a grab bar. Keep electrical cords out of the way. Do not use floor polish or wax that makes floors slippery. If you must use wax, use non-skid floor wax. Do not have throw rugs and other things on the floor that can make you trip. What can I do with my stairs? Do not leave any items on the stairs. Make sure that there are  handrails on both sides of the stairs and use them. Fix handrails that are broken or loose. Make sure that handrails are as long as the stairways. Check any carpeting to make sure that it is firmly attached to the stairs. Fix any carpet that is loose or worn. Avoid having throw rugs at the top or bottom of the stairs. If you do have throw rugs, attach them to the floor with carpet tape. Make sure that you have a light switch at the top of the stairs and the bottom of the stairs. If you do not have them, ask someone to add them for you. What else can I do to help prevent falls? Wear shoes that: Do not have high heels. Have rubber bottoms. Are comfortable and fit you well. Are closed at the toe. Do not wear sandals. If you use a stepladder: Make sure that it is fully opened. Do not climb a closed stepladder. Make sure that both sides of the stepladder are locked into place. Ask someone to hold it for you, if  possible. Clearly mark and make sure that you can see: Any grab bars or handrails. First and last steps. Where the edge of each step is. Use tools that help you move around (mobility aids) if they are needed. These include: Canes. Walkers. Scooters. Crutches. Turn on the lights when you go into a dark area. Replace any light bulbs as soon as they burn out. Set up your furniture so you have a clear path. Avoid moving your furniture around. If any of your floors are uneven, fix them. If there are any pets around you, be aware of where they are. Review your medicines with your doctor. Some medicines can make you feel dizzy. This can increase your chance of falling. Ask your doctor what other things that you can do to help prevent falls. This information is not intended to replace advice given to you by your health care provider. Make sure you discuss any questions you have with your health care provider. Document Released: 07/05/2009 Document Revised: 02/14/2016 Document Reviewed: 10/13/2014 Elsevier Interactive Patient Education  2017 [employer].

## 2022-08-07 NOTE — Progress Notes (Signed)
Subjective:   [name] [name] is a 71 y.o. male who presents for Medicare Annual/Subsequent preventive examination.  I connected with  Janace Aris on 08/07/22 by a audio enabled telemedicine application and verified that I am speaking with the correct person using two identifiers.  Patient Location: Home  Provider Location: Office/Clinic  I discussed the limitations of evaluation and management by telemedicine. The patient expressed understanding and agreed to proceed.   Review of Systems    Defer to PCP Cardiac Risk Factors include: advanced age (>32mn, >>19women);male gender     Objective:    There were no vitals filed for this visit. There is no height or weight on file to calculate BMI.     08/07/2022    2:25 PM 04/08/2022    8:40 AM 03/04/2022    2:34 PM 03/04/2022   11:54 AM 03/04/2022   11:13 AM 10/31/2021    8:13 AM 08/08/2021    4:41 PM  Advanced Directives  Does Patient Have a Medical Advance Directive? Yes Yes Yes Yes Yes Yes Yes  Type of AParamedicof ACaruthersLiving will [place]ast CantonLiving will;Out of facility DNR (pink MOST or yellow form) HMoc[place]Living will HMineral RidgeLiving will HGrovetonLiving will Living will   Does patient want to make changes to medical advance directive?   No - Patient declined No - Patient declined No - Patient declined    Copy of [place] Chart? No - copy requested   No - copy requested       Current Medications (verified) Outpatient Encounter Medications as of 08/07/2022  Medication Sig   aspirin 81 MG chewable tablet Chew 81 mg by mouth daily.   carbidopa-levodopa (SINEMET CR) 50-200 MG tablet TAKE 1 TABLET BY MOUTH EVERYDAY AT BEDTIME   carbidopa-levodopa (SINEMET IR) 25-100 MG tablet Take 1 tablet by mouth 4 (four) times daily. 7am/11am/3pm/7pm   Cholecalciferol (VITAMIN D3) 3000 UNITS TABS Take 1,000  Units by mouth daily.    clonazePAM (KLONOPIN) 0.5 MG tablet TAKE 0.5 TABLETS (0.25 MG TOTAL) BY MOUTH AT BEDTIME.   COVID-19 mRNA bivalent vaccine, Pfizer, (PFIZER COVID-19 VAC BIVALENT) injection Inject into the muscle.   cyanocobalamin 100 MCG tablet Take 1,000 mcg by mouth daily.    famotidine (PEPCID) 10 MG tablet Take 10 mg by mouth 2 (two) times daily.   Melatonin 3 MG TABS Take 3 mg by mouth at bedtime.    pramipexole (MIRAPEX) 0.75 MG tablet Take 1 tablet (0.75 mg total) by mouth 3 (three) times daily.   No facility-administered encounter medications on file as of 08/07/2022.    Allergies (verified) Patient has no known allergies.   History: Past Medical History:  Diagnosis Date   Cervical lymphadenopathy    right - followed by ENT   DOE (dyspnea on exertion) 07/15/2018   Dysphagia, neurologic 07/27/2014   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  Past Surgical History:  Procedure Laterality Date   CHOLECYSTECTOMY, LAPAROSCOPIC     EYE SURGERY     x2 as child   PORT-A-CATH REMOVAL     PORTACATH PLACEMENT     SPLENECTOMY     TONSILLECTOMY     Family History  Problem Relation Age of Onset   Heart disease Mother    Cancer Mother        lung   Heart disease Father    Social History   Socioeconomic History   Marital status: Married    Spouse name: Not on file   Number of children: 5   Years of education: 16    Highest education level: Bachelor's degree (e.g., BA, AB, BS)  Occupational History   Occupation: Retired    Comment: IT/former CIO  Tobacco Use   Smoking status: Never   Smokeless tobacco: Never  Vaping Use   Vaping Use: Never used  Substance and Sexual Activity   Alcohol use: No    Alcohol/week: 0.0 standard drinks of alcohol   Drug use: No   Sexual activity: Not on file  Other Topics Concern   Not on file  Social History Narrative   Right handed   3 story home   Lives with spouse   Social Determinants of Health   Financial Resource Strain: Low Risk  (08/07/2022)   Overall Financial Resource Strain (CARDIA)    Difficulty of Paying Living Expenses: Not hard at all  Food Insecurity: No Food Insecurity (08/07/2022)   Hunger Vital Sign    Worried About Running Out of Food in the Last Year: Never true    Ran Out of Food in the Last Year: Never true  Transportation Needs: No Transportation Needs (08/07/2022)   PRAPARE - Hydrologist (Medical): No    Lack of Transportation (Non-Medical): No  Physical Activity: Inactive (08/07/2022)   Exercise Vital Sign    Days of Exercise per Week: 0 days    Minutes of Exercise per Session: 0 min  Stress: No Stress Concern Present (08/07/2022)   [place]    Feeling of Stress : Not at all  Social Connections: Susan Moore (08/07/2022)   Social Connection and Isolation Panel [NHANES]    Frequency of Communication with Friends and Family: More than three times a week    Frequency of Social Gatherings with Friends and Family: More than three times a week    Attends Religious Services: More than 4 times per year    Active Member of [employer] or Organizations: Yes    Attends Music therapist: More than 4 times per year    Marital Status: Married    Tobacco Counseling Counseling given: Not Answered   Clinical Intake:  Pre-visit  preparation completed: Yes  Pain : No/denies pain  Diabetes: No  How often do you need to have someone help you when you read instructions, pamphlets, or other written materials from your doctor or pharmacy?: 1 - Never   Activities of Daily Living    08/07/2022    2:27 PM  In your present state of health, do you have any difficulty performing the following activities:  Hearing? 0  Vision? 1  Difficulty concentrating or making decisions? 1  Walking or climbing stairs? 0  Dressing or bathing? 0  Doing errands, shopping? 0  Preparing Food and eating ? N  Using the Toilet? N  In the past six months,  have you accidently leaked urine? N  Do you have problems with loss of bowel control? N  Managing your Medications? N  Managing your Finances? N  Housekeeping or managing your Housekeeping? N    Patient Care Team: [name], DO as PCP - General (Family Medicine) Tat, Eustace Quail, DO as Consulting Physician (Neurology)  Indicate any recent Medical Services you may have received from other than [facility] providers in the past year (date may be approximate).     Assessment:   This is a routine wellness examination for [facility].  Hearing/Vision screen No results found.  Dietary issues and exercise activities discussed: Current Exercise Habits: The patient does not participate in regular exercise at present, Exercise limited by: None identified   Goals Addressed   None    Depression Screen    08/07/2022    2:26 PM 07/04/2022   11:21 AM 08/05/2021   12:43 PM 12/24/2016    7:57 AM 05/22/2016    1:34 PM  PHQ 2/9 Scores  PHQ - 2 Score 0 0 0 0 0  PHQ- 9 Score  1       Fall Risk    08/07/2022    2:26 PM 07/04/2022   11:20 AM 04/08/2022    8:40 AM 10/31/2021    8:13 AM 08/05/2021   12:43 PM  Fall Risk   Falls in the past year? 1 1 0 0 0  Number falls in past yr: 0 0 0 0 0  Injury with Fall? 1 1 0 0 0  Comment  [place]     Risk for  fall due to : History of fall(s)    No Fall Risks  Follow up Falls evaluation completed Falls evaluation completed   Falls evaluation completed    [place]:  Any stairs in or around the home? Yes  If so, are there any without handrails? No  Home free of loose throw rugs in walkways, pet beds, electrical cords, etc? Yes  Adequate lighting in your home to reduce risk of falls? Yes   ASSISTIVE DEVICES UTILIZED TO PREVENT FALLS:  Life alert? No  Use of a cane, walker or w/c? No  Grab bars in the bathroom? Yes  Shower chair or bench in shower? Yes  Elevated toilet seat or a handicapped toilet? Yes   TIMED UP AND GO:  Was the test performed?  No, audio visit .    Cognitive Function:    04/29/2018    8:39 AM  MMSE - Mini Mental State Exam  Not completed: Unable to complete        08/07/2022    2:29 PM  6CIT Screen  What Year? 0 points  What month? 0 points  What time? 0 points  Count back from 20 0 points  Months in reverse 0 points  Repeat phrase 0 points  Total Score 0 points    Immunizations Immunization History  Administered Date(s) Administered   Fluad Quad(high Dose 65+) 07/14/2019, 08/05/2021, 07/04/2022   Influenza Whole 09/06/2007, 06/28/2008   Influenza, High Dose Seasonal PF 06/20/2016, 07/02/2017, 07/15/2018, 06/28/2020   Influenza,inj,Quad PF,6+ Mos 07/10/2014, 06/11/2015   Meningococcal B, OMV 04/02/2015   Meningococcal Conjugate 04/02/2015, 06/11/2015   PFIZER(Purple Top)SARS-COV-2 Vaccination 11/04/2019, 11/29/2019, 10/02/2020   Pfizer Covid-19 Vaccine Bivalent Booster 74yr & up 08/08/2021   Pneumococcal Conjugate-13 04/02/2015   Pneumococcal Polysaccharide-23 06/23/2006, 07/01/2016   Td 05/04/2006, 06/20/2016    TDAP  status: Up to date  Flu Vaccine status: Up to date  Pneumococcal vaccine status: Up to date  Covid-19 vaccine status: Information provided on how to obtain vaccines.   Qualifies for Shingles  Vaccine? Yes   Zostavax completed No   Shingrix Completed?: No.    Education has been provided regarding the importance of this vaccine. Patient has been advised to call insurance company to determine out of pocket expense if they have not yet received this vaccine. Advised may also receive vaccine at local pharmacy or Health Dept. Verbalized acceptance and understanding.  Screening Tests Health Maintenance  Topic Date Due   Zoster Vaccines- Shingrix (1 of 2) Never done   Medicare Annual Wellness (AWV)  07/02/2018   COVID-19 Vaccine (5 - Pfizer risk series) 01/03/2023 (Originally 10/03/2021)   COLONOSCOPY (Pts 45-45yr Insurance coverage will need to be confirmed)  06/04/2025   TETANUS/TDAP  06/20/2026   Pneumonia Vaccine 71 Years old  Completed   INFLUENZA VACCINE  Completed   Hepatitis C Screening  Completed   HPV VACCINES  Aged Out    Health Maintenance  Health Maintenance Due  Topic Date Due   Zoster Vaccines- Shingrix (1 of 2) Never done   Medicare Annual Wellness (AWV)  07/02/2018    Colorectal cancer screening: Type of screening: Colonoscopy. Completed 06/05/15. Repeat every 10 years  Lung Cancer Screening: (Low Dose CT Chest recommended if Age 71-80years, 30 pack-year currently smoking OR have quit w/in 15years.) does not qualify.    Additional Screening:  Hepatitis C Screening: does qualify; Completed 08/14/21  Vision Screening: Recommended annual ophthalmology exams for early detection of glaucoma and other disorders of the eye. Is the patient up to date with their annual eye exam?  Yes  Who is the provider or what is the name of the office in which the patient attends annual eye exams? Doesn't remember name, [facility] a provider, would they like to be referred to a provider to establish care? No .   Dental Screening: Recommended annual dental exams for proper oral hygiene  Community Resource Referral / Chronic Care  Management: CRR required this visit?  No   CCM required this visit?  No      Plan:     I have personally reviewed and noted the following in the patient's chart:   Medical and social history Use of alcohol, tobacco or illicit drugs  Current medications and supplements including opioid prescriptions. Patient is not currently taking opioid prescriptions. Functional ability and status Nutritional status Physical activity Advanced directives List of other physicians Hospitalizations, surgeries, and ER visits in previous 12 months Vitals Screenings to include cognitive, depression, and falls Referrals and appointments  In addition, I have reviewed and discussed with patient certain preventive protocols, quality metrics, and best practice recommendations. A written personalized care plan for preventive services as well as general preventive health recommendations were provided to patient.   Due to this being a telephonic visit, the after visit summary with patients personalized plan was offered to patient via mail or my-chart.  Patient would like to access on my-chart.  BBeatris Ship COregon  08/07/2022   Nurse Notes: None

## 2022-09-08 ENCOUNTER — Telehealth: Payer: Self-pay | Admitting: Physical Therapy

## 2022-09-08 DIAGNOSIS — G20A1 Parkinson's disease without dyskinesia, without mention of fluctuations: Secondary | ICD-10-CM

## 2022-09-08 NOTE — Telephone Encounter (Signed)
Dr. Carles Collet,  [name] [name] is scheduled for  OT, PT, and ST return evaluations in January 2024 as recommended when he was last discharged from therapy, due to progressive nature of diagnosis.  Pt was in agreement with this plan.  If you are in agreement, please send updated order for OT, PT, and ST via epic.  Thank you,  Mady Haagensen, PT 09/08/22 1:41 PM Phone: [phone] Fax: [phone]  [facility] [address], [address] [place], [place] [address] Phone # [phone] Fax # [phone]

## 2022-10-03 NOTE — Progress Notes (Signed)
Assessment/Plan:   1.  Parkinsons Disease  -Continue carbidopa/levodopa 25/100, 1 tablet at 7 AM/11 AM/3 PM/7 PM  - continue carbidopa/levodopa 50/200 CR q hs  -Continue pramipexole, 0.75 mg 3 times per day  -discussed freezing/walkers/sensory cuing techniques.  -discussed that he needs to get back to exercise.  -We discussed that it used to be thought that levodopa would increase risk of melanoma but now it is believed that Parkinsons itself likely increases risk of melanoma. he is to get regular skin checks.   2.  Meige syndrome  -Stable  -no tx needed right now  3.  Insomnia/mild RBD  -Continue clonazepam, 0.5 mg, 1/2 tablet at night.  Not taking faithfully.   Higher dosages didn't help and had hang over effect  -add remeron, 15 mg q hs.     4.  Memory change  -Patient saw Dr. Milbert Coulter January 10, 2022 with evidence of MCI.    Subjective:   [name] [name] was seen today in follow up for Parkinsons disease.  My previous records were reviewed prior to todays visit as well as outside records available to me. Wife with patient and supplements history.  Patient has been to speech therapy since our last visit.  Those notes are reviewed.  He has had one fall - it was his [birth year] on [birth year].  He states that he was in the bathroom and his feet got stuck (was coming through the door) and his face landed on the carpet but knee landed on the hard floor of the bathroom.  He was evaluated for the knee at the doctor.  He was able to get up with wifes help.  He hasn't done a class or exercise since.  Some trouble with insomnia.  Trouble with freezing at home.    Current prescribed movement disorder medications: Pramipexole, 0.75 mg 3 times daily Carbidopa/levodopa 25/100, 1 tablet at 7 AM/11 AM/3 PM/7 PM  Carbidopa/levodopa 50/200 CR at bedtime (started last visit to see if it helped with first morning on) clonazepam, 0.5 mg, 1/2 tablet at bedtime    ALLERGIES:  No Known Allergies  CURRENT  MEDICATIONS:  Outpatient Encounter Medications as of 10/06/2022  Medication Sig   aspirin 81 MG chewable tablet Chew 81 mg by mouth daily.   carbidopa-levodopa (SINEMET CR) 50-200 MG tablet TAKE 1 TABLET BY MOUTH EVERYDAY AT BEDTIME   carbidopa-levodopa (SINEMET IR) 25-100 MG tablet Take 1 tablet by mouth 4 (four) times daily. 7am/11am/3pm/7pm   Cholecalciferol (VITAMIN D3) 3000 UNITS TABS Take 1,000 Units by mouth daily.    clonazePAM (KLONOPIN) 0.5 MG tablet TAKE 0.5 TABLETS (0.25 MG TOTAL) BY MOUTH AT BEDTIME.   COVID-19 mRNA bivalent vaccine, Pfizer, (PFIZER COVID-19 VAC BIVALENT) injection Inject into the muscle.   cyanocobalamin 100 MCG tablet Take 1,000 mcg by mouth daily.    famotidine (PEPCID) 10 MG tablet Take 10 mg by mouth 2 (two) times daily.   Melatonin 3 MG TABS Take 3 mg by mouth at bedtime.    pramipexole (MIRAPEX) 0.75 MG tablet Take 1 tablet (0.75 mg total) by mouth 3 (three) times daily.   No facility-administered encounter medications on file as of 10/06/2022.    Objective:   PHYSICAL EXAMINATION:    VITALS:   Vitals:   10/06/22 0840  BP: 122/76  Pulse: 66  SpO2: 96%  Weight: 210 lb 9.6 oz (95.5 kg)  Height: 5\' 6"  (1.676 m)      GEN:  The patient appears stated age and  is in NAD. HEENT:  Normocephalic, atraumatic.  The mucous membranes are moist.  CV:  RRR Lungs:  CTAB Neck:  no bruits  Neurological examination:  Orientation: The patient is alert and oriented x3. Cranial nerves: There is good facial symmetry with facial hypomimia. The speech is fluent and pseudobulbar. Soft palate rises symmetrically and there is no tongue deviation. Hearing is intact to conversational tone. Sensation: Sensation is intact to light touch throughout Motor: Strength is at least antigravity x4.  Movement examination: Tone: There is nl tone in the ue/le Abnormal movements: none Coordination:  There is mild decremation with toe taps bilaterally Gait and Station: The  patient has no difficulty arising out of a deep-seated chair without the use of the hands. The patient's stride length is good today.  I have reviewed and interpreted the following labs independently    Chemistry      Component Value Date/Time   NA 140 08/14/2021 0906   K 4.3 08/14/2021 0906   CL 104 08/14/2021 0906   CO2 29 08/14/2021 0906   BUN 15 08/14/2021 0906   CREATININE 1.13 08/14/2021 0906   CREATININE 0.94 09/26/2014 0925      Component Value Date/Time   CALCIUM 9.1 08/14/2021 0906   ALKPHOS 53 08/14/2021 0906   AST 26 08/14/2021 0906   ALT 13 08/14/2021 0906   BILITOT 0.7 08/14/2021 0906       Lab Results  Component Value Date   WBC 9.1 04/12/2018   HGB 15.5 04/12/2018   HCT 45.4 04/12/2018   MCV 98.0 04/12/2018   PLT 307.0 04/12/2018    Lab Results  Component Value Date   TSH 1.67 03/27/2015     Total time spent on today's visit was 32  minutes, including both face-to-face time and nonface-to-face time.  Time included that spent on review of records (prior notes available to me/labs/imaging if pertinent), discussing treatment and goals, answering patient's questions and coordinating care.  Cc:  Sharlene Dory, DO

## 2022-10-04 ENCOUNTER — Other Ambulatory Visit: Payer: Self-pay | Admitting: Neurology

## 2022-10-06 ENCOUNTER — Encounter: Payer: Self-pay | Admitting: Neurology

## 2022-10-06 ENCOUNTER — Ambulatory Visit (INDEPENDENT_AMBULATORY_CARE_PROVIDER_SITE_OTHER): Payer: Medicare Other | Admitting: Neurology

## 2022-10-06 VITALS — BP 122/76 | HR 66 | Ht 66.0 in | Wt 210.6 lb

## 2022-10-06 DIAGNOSIS — G20A2 Parkinson's disease without dyskinesia, with fluctuations: Secondary | ICD-10-CM

## 2022-10-06 MED ORDER — CARBIDOPA-LEVODOPA 25-100 MG PO TABS: 1.0000 | ORAL_TABLET | Freq: Four times a day (QID) | ORAL | 2 refills | Status: DC

## 2022-10-06 MED ORDER — MIRTAZAPINE 15 MG PO TABS: 15.0000 mg | ORAL_TABLET | Freq: Every day | ORAL | 1 refills | Status: DC

## 2022-10-06 MED ORDER — CARBIDOPA-LEVODOPA ER 50-200 MG PO TBCR: 1.0000 | EXTENDED_RELEASE_TABLET | Freq: Every day | ORAL | 2 refills | Status: DC

## 2022-10-06 MED ORDER — PRAMIPEXOLE DIHYDROCHLORIDE 0.75 MG PO TABS: 0.7500 mg | ORAL_TABLET | Freq: Three times a day (TID) | ORAL | 2 refills | Status: DC

## 2022-10-06 NOTE — Patient Instructions (Signed)
Restart exercise! Add mirtazapine, 15 mg at bed.  Local and Online Resources for Power over Parkinson's Group  January 2024   LOCAL [place] PARKINSON'S GROUPS   Power over Parkinson's Group:    Power Over Parkinson's Patient Education Group will be Wednesday, January 10th-*Hybrid meting*- in person at [facility] location and via [facility], 2:00-3:00 pm.   Starting in November, Power over [employer] and Care Partner Groups will meet together, with plans for separate break out session for caregivers (*this will be evolving over the next few months) Upcoming Power over Parkinson's Meetings/Care Partner Support:  2nd Wednesdays of the month at 2 pm:   January 10th, February 14th [place] at [email or web address] if interested in participating in this group    [place]! Moves Dynegy Instructor-Led Classes offering at [employer]!  TUESDAYS and Wednesdays 1-2 pm.   Contact Vonna Kotyk at  [email or web address]  or [phone] (Tuesday classes are modified for chair and standing only) Drumming for Parkinson's will be held on 2nd and 4th Mondays at 11:00 am.   Located at the [place] ([place].)  Contact Doylene Canning at [email or web address] or [place] Class, Mondays at 11 am.  Call [phone] for details Let's Try Pickleball-$25 for 6 weeks of Pickleball in [place].  Contact Corwin Levins for more details and for dates.  [email or web address] SAVE THE DATE and REGISTER:  [place] Chapter of Parkinson's Foundation:  Parkinson's Symposium.  Conversations about Parkinson's.  Saturday, November 22, 2022, 9:00 am-2:00 pm.  [place], *In person or online via [place]*.  Register at [email or web address] or call Beverlee Nims at [phone].   [place]:  [email or web address]  PD Health at Home continues:  Mindfulness Mondays, Wellness Wednesdays, Fitness Fridays   Upcoming Education:    Environmental health practitioner your Voice:  Air cabin crew. Wednesday, January 3rd,  1-2 pm  Managing Weight Loss & Retaining Muscle Mass.  Wednesday, Jan 10th, 1-2 pm Changes in Speech and Voice.  Wednesday, January 17th, 1-2 pm Register for virtual education and Patent attorney (webinars) at [email or web address].pl Please check out their website to sign up for emails and see their full online offerings      [place]:  [email or web address]   Third Thursday Webinars:  On the third Thursday of every month at 12 p.m. ET, join our free live webinars to learn about various aspects of living with Parkinson's disease and our work to speed medical breakthroughs.  Upcoming Webinar:  New Year, New Moves!  Explore Exercise for Life with Parkinson's.  Thursday, January 18th at 12 noon. Check out additional information on their website to see their full online offerings    [facility]:  [email or web address]  Upcoming Webinar:   Physical Therapy and Parkinson's.  Thursday, January 11th, 2 pm  Webinar Series:  Living with Parkinson's Meetup.   Third Thursdays each month, 3 pm  Care Partner Monthly Meetup.  With Robin Searing Phinney.  First Tuesday of each month, 2 pm  Check out additional information to Live Well Today on their website    Parkinson and Movement Disorders (PMD) Alliance:  www.[email or web address]  [employer] Online:  Online Education Events  Sign up for emails, which are sent weekly to give you updates on programming and online offerings    Parkinson's Association of the [place]:  www.[email or web address]  Information on online support groups,  education events, and online exercises including Yoga, Parkinson's exercises and more-LOTS of information on links to PD  resources and online events  Virtual Support Group through Parkinson's Association of the [place]; next one is scheduled for Wednesday, Feb 7th  MOVEMENT AND EXERCISE OPPORTUNITIES  PWR! Moves Classes at [place].  Wednesdays 10 and 11 am.   Contact Amy Marriott, PT [email or web address] if interested.  NEW PWR! Moves Class offerings at [employer].  *TUESDAYS* and Wednesdays 1-2 pm.    Contact Vonna Kotyk at  [email or web address]    Parkinson's Wellness Recovery (PWR! Moves)  www.[email or web address]  Info on the PWR! Virtual Experience:  You will have access to our expertise?through self-assessment, guided plans that start with the PD-specific fundamentals, educational content, tips, Q&A with an expert, and a growing Art therapist of PD-specific pre-recorded and live exercise classes of varying types and intensity - both physical and cognitive! If that is not enough, we offer 1:1 wellness consultations (in-person or virtual) to personalize your PWR! Research scientist (medical).   [place] Fridays:   As part of the PD Health @ Home program, this free video series focuses each week on one aspect of fitness designed to support people living with Parkinson's.? These weekly videos highlight the [place] fitness guidelines for people with Parkinson's disease.  ModemGamers.[email or web address]   Dance for PD website is offering free, live-stream classes throughout the week, as well as links to [employer] of classes:  [email or web address]  Virtual dance and Pilates for Parkinson's classes: Click on the Community Tab> Parkinson's Movement Initiative Tab.  To register for classes and for more information, visit [email or web address] and click the "community" tab.   [employer] Parkinson's Cycling Classes   [employer]:  Thursdays @ Noon-Live classes at [employer] ([employer] at  [email or web address]?or [phone])  [employer]: Virtual Classes Mondays and Thursdays Jeanette Caprice classes Tuesday, Wednesday and Thursday (contact [place] at [email or web address] ?or [phone])  [place]  Varied levels of classes are offered Tuesdays and Thursdays at [employer].   Stretching with Verdis Frederickson weekly class is also offered for people with Parkinson's  To observe a class or for more information, call [phone] or email Hezzie Bump at [email or web address]   ADDITIONAL SUPPORT AND RESOURCES  Well[employer]Spring [employer]:Online Caregiver Education Opportunities:  www.well-[email or web address]/caregiver-education/[email or web address]-support-group.  You may also contact Vickki Muff at [email or web address] or [phone].     Well-Spring Navigator:  Just1Navigator program, a?free service to help individuals and families through the journey of determining care for older adults.  The "Navigator" is a [phone], Education officer, museum, who will speak with a prospective client and/or loved ones to provide an assessment of the situation and a set of recommendations for a personalized care plan -- all free of charge, and whether?Well-Spring [employer] offers the needed service or not. If the need is not a service we provide, we are well-connected with reputable programs in town that we can refer you to.  www.well-springsolutions.org or to speak with the Navigator, call [phone].

## 2022-10-09 ENCOUNTER — Ambulatory Visit: Payer: Medicare Other | Admitting: Neurology

## 2022-10-09 ENCOUNTER — Ambulatory Visit: Payer: Medicare Other | Admitting: Physical Therapy

## 2022-10-09 ENCOUNTER — Encounter: Payer: Self-pay | Admitting: Physical Therapy

## 2022-10-09 ENCOUNTER — Other Ambulatory Visit: Payer: Self-pay

## 2022-10-09 ENCOUNTER — Ambulatory Visit: Payer: Medicare Other | Attending: Neurology | Admitting: Occupational Therapy

## 2022-10-09 ENCOUNTER — Ambulatory Visit: Payer: Medicare Other

## 2022-10-09 DIAGNOSIS — R41841 Cognitive communication deficit: Secondary | ICD-10-CM | POA: Insufficient documentation

## 2022-10-09 DIAGNOSIS — R471 Dysarthria and anarthria: Secondary | ICD-10-CM | POA: Diagnosis not present

## 2022-10-09 DIAGNOSIS — R2681 Unsteadiness on feet: Secondary | ICD-10-CM | POA: Insufficient documentation

## 2022-10-09 DIAGNOSIS — R278 Other lack of coordination: Secondary | ICD-10-CM | POA: Insufficient documentation

## 2022-10-09 DIAGNOSIS — R29818 Other symptoms and signs involving the nervous system: Secondary | ICD-10-CM | POA: Diagnosis not present

## 2022-10-09 DIAGNOSIS — R293 Abnormal posture: Secondary | ICD-10-CM

## 2022-10-09 DIAGNOSIS — R2689 Other abnormalities of gait and mobility: Secondary | ICD-10-CM | POA: Insufficient documentation

## 2022-10-09 NOTE — Therapy (Signed)
OUTPATIENT SPEECH LANGUAGE PATHOLOGY PARKINSON'S EVALUATION   Patient Name: [name] [name] MRN: [id number] DOB:08-Apr-1951, 72 y.o., male Today's Date: 10/09/2022  PCP: Dr. Deloris Ping REFERRING PROVIDER: Alonza Bogus, DO   End of Session - 10/09/22 1512     Visit Number 1    Number of Visits 17    Date for SLP Re-Evaluation 12/18/22    SLP Start Time 60    SLP Stop Time  1442    SLP Time Calculation (min) 37 min    Activity Tolerance Patient tolerated treatment well              Past Medical History:  Diagnosis Date   Cervical lymphadenopathy    right - followed by ENT   DOE (dyspnea on exertion) 07/15/2018   Dysphagia, neurologic 07/27/2014   Dystonia 1994   diagnosed in [place]   Enlarged lymph nodes 07/28/2007   Gastroesophageal reflux disease 03/07/2020   Localized swelling of right lower extremity 04/12/2019   Low back pain 11/21/2011   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   Mild neurocognitive disorder due to Parkinson's disease 01/10/2022   Non-Hodgkin lymphoma 2007   diffuse- 6 cycles of chemo with R CHOP Rituxan - last dose 10/08   Nonspecific elevation of levels of transaminase or lactic acid dehydrogenase (LDH) 07/06/2008   Parkinson's disease 07/27/2014   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   Past Surgical History:  Procedure Laterality Date   CHOLECYSTECTOMY, LAPAROSCOPIC     EYE SURGERY     x2 as child   PORT-A-CATH REMOVAL     PORTACATH PLACEMENT     SPLENECTOMY     TONSILLECTOMY     Patient Active Problem List   Diagnosis Date Noted   Mild neurocognitive disorder due to Parkinson's disease 01/10/2022   Gastroesophageal reflux disease 03/07/2020   Localized swelling of right lower extremity 04/12/2019   DOE (dyspnea on exertion) 07/15/2018   10 year risk of MI or stroke 7.5% or greater 04/12/2018   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   Dysphagia,  neurologic 07/27/2014   Parkinson's disease 07/27/2014   Dystonia 07/10/2014   Low back pain 11/21/2011   Nonspecific elevation of levels of transaminase or lactic acid dehydrogenase (LDH) 07/06/2008   Enlarged lymph nodes 07/28/2007   Non-Hodgkin lymphoma 03/23/2007    ONSET DATE: Dx in mid-2010's  REFERRING DIAG: G20 - Parkinson's disease   THERAPY DIAG:  Dysarthria and anarthria  Cognitive communication deficit  Rationale for Evaluation and Treatment Rehabilitation  SUBJECTIVE:   SUBJECTIVE STATEMENT: "I was doing well (with the homework) until October 5th when I fell. I know I'm getting soft now but I can't do anything about it." "Those hints you gave me last time for my medications are still helping me." Pt accompanied by: self  PERTINENT HISTORY: Loye VentoMarv") is well known to this SLP - previous ST course d/c'd in August 2023 with pt having speech volume in upper 60s dB in almost 10 minutes mod complex conversation.   PAIN:  Are you having pain? No  FALLS: Has patient fallen in last 6 months?  Yes. See PT eval.  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 Chaikin completed a comprehensive neuropsychological evaluation on  01/10/2022. Please refer to that encounter for the full report and recommendations. Briefly, results suggested isolated impaired performances across complex attention and semantic fluency. Performance variability was also exhibited across all aspects of learning and memory. Regarding etiology, the most likely culprit for ongoing cognitive weakness remains his past history of Parkinson's disease. While processing speed was improved relative to what might be expected, dysfunction  surrounding complex attention and encoding/retrieval deficits of memory are quite common in this illness. The fact that motor/gait dysfunction was noted years prior to concern surrounding cognitive decline is also a typical timeline associated with Parkinson's disease. Mild anxiety and sleep dysfunction could further influence cognitive performances. Research surrounding cognitive side effects of pramipexole is mixed; however, I cannot rule out an ongoing side effect contribution as well."  COGNITION: Overall cognitive status: Impaired: Attention: Impaired: Alternating, Divided and Memory: Impaired: Short term - reported.  Areas of impairment: Attention and Memory Comments: [name] gives example of attention difficulties- pt was helping wife on computer last night and could not provide her with verbal answers she desired and had to wait until he was at a stopping point to answer her questions. He continues to compensate by doing one thing and then the other. Of some concern is his inability to recall names "I was never really good at names but now it's pretty bad."   MOTOR SPEECH: Overall motor speech: impaired Level of impairment: Word Respiration: thoracic breathing, clavicular breathing, and speaking on residual capacity Phonation: low vocal intensity low 60s dB Resonance: WFL Articulation: Impaired: conversation  Intelligibility: Intelligibility reduced ;95-100%  Interfering components:  Effective technique: increased vocal intensity  ORAL MOTOR ASSESSMENT:   Impaired: Facial : Symmetry impaired: Impaired left Eyelid and cheek musculature on lt exhibit slight droop compared to rt. Labial and lingual coordination are affected in alternative motion tasks . Lt lingual strength mildly impaired. These findings are comparable to findings in summer 2023.   OBJECTIVE ASSESSMENT:  Measured when a sound level meter was placed 30 cm away from pt's masked mouth, 10 minutes of simple conversational  speech was reduced today, at average low 60s dB (WNL= average 70-72dB) with range of 59-67dB. Overall speech intelligibility for this listener is 95-100%. Production of loud /a/ averaged low 90s dB (range of 86 to 95dB) and initial rare min cues needed for loudness.   Pt rated effort level at 8/10 for production of loud /a/ (10=maximal effort). In 2 minutes short conversation task pt was asked to use the same amount of effort as with loud /a/. Loudness average with this increased effort was mid-upper 60s dB (range of 60 to 71dB) with occasional min verbal cues for loudness. Pt would benefit from skilled ST in order to improve speech intelligibility and pt's QOL.  [name]'s biggest concern at this time is his speech, as he tells SLP he is compensating well for attention and memory. However he does have concern about remembering people's names saying "I was never really good with names, but I have more trouble now."  Pt does not report difficulty with swallowing warranting further evaluation however SLP to monitor pt and inquire again about this during this therapy course.    PATIENT REPORTED OUTCOME MEASURES (PROM): Communication Effectiveness Survey: Pt scored 18/32 on this survey (pt scored 29/32 at discharge in August 2023).   Patient does not report any communication on the survey as 4 ("very effective"). He reports conversing with a familiar person over the telephone, being part of a conversation in a noisy  environment, and speaking to a friend when he is emotionally upset as 3 ("mostly effective"). Scoring 2, or "Somewhat effective" was having a conversation with the family member or friends at home, participating in conversation with strangers in a quiet place, conversing with a stranger over the telephone, and having a conversation while traveling in a car. He rated 1 ("not at all effective") having a conversation with someone at a distance, like across the room.  TODAY'S TREATMENT:  SLP reviewed  the rationale for BID completion of loud /a/ and everyday sentences. SLP ensured pt could obtain comparable loud /a/ measurements at d/c last course and he did so, with average low 90s dB. "I'm amazed I can still get up there that loud," pt told SLP. Pt stated everyday sentences at low 80s dB, comparable to previous course. Discussed goals and pt agreed.    PATIENT EDUCATION: Education details: see above in "today's treatment" Person educated: Patient Education method: Explanation, Demonstration, and Verbal cues Education comprehension: verbalized understanding and return demonstrated   HOME EXERCISE PROGRAM: Loud /a/ x6 BID, and everyday sentences.   GOALS: Goals reviewed with patient? Yes  SHORT TERM GOALS: Target date: 11/13/22    pt will produce loud /a/ with at least low 90s dB average over three sessions Baseline: Goal status: INITIAL  2.  Pt will produce 16/20 sentence responses with 70dB over two sessions Baseline:  Goal status: INITIAL  3.  Pt will produce 5 minutes simple conversation with volume upper 60s dB average over three sessions Baseline:  Goal status: INITIAL  4.  pt will generate abdominal breathing 80% of the time when engaging in 5 minutes simple conversation in 2 sessions Baseline:  Goal status: INITIAL    LONG TERM GOALS: Target date: 12/18/22    Pt will score higher than 18/32 on CES in the final two ST sessions Baseline:  Goal status: INITIAL  2.  Pt will report successful usage of strategies for recall of people's names in 2 sessions Baseline:  Goal status: INITIAL  3.  Pt will maintain average of upper 60s dB over 10 minute simple-mod complex conversation with rare min A in three sessions  Baseline:  Goal status: INITIAL  4.  pt will use abdominal breathing 75% of the time in 10 minutes simple-mod complex conversation in three sessions  Baseline:  Goal status: INITIAL   ASSESSMENT:  CLINICAL IMPRESSION: Patient is a 72 y.o. male who  was seen today for assessment of speech clarity who demonstrated mod dysarthria and reduced breath support due to Parkinson's disease. When asked to produce speech with same effort as loud /a/ pt improved volume in conversation in upper 60s dB with cues from SLP. Pt would benefit from skilled ST targeting loudness in conversation, and strategies for recalling people's names.  OBJECTIVE IMPAIRMENTS  Objective impairments include memory and dysarthria. These impairments are limiting patient from managing medications, household responsibilities, and effectively communicating at home and in community.Factors affecting potential to achieve goals and functional outcome are previous level of function and severity of impairments.. Patient will benefit from skilled SLP services to address above impairments and improve overall function.  REHAB POTENTIAL: Good  PLAN: SLP FREQUENCY: 2x/week  SLP DURATION: 8 weeks  PLANNED INTERVENTIONS: Environmental controls, Cueing hierachy, Internal/external aids, Functional tasks, Multimodal communication approach, SLP instruction and feedback, and Compensatory strategies    Rebecah Dangerfield, CCC-SLP 10/09/2022, 3:13 PM

## 2022-10-09 NOTE — Therapy (Addendum)
OUTPATIENT OCCUPATIONAL THERAPY PARKINSON'S EVALUATION  Patient Name: [name] [name] MRN: [id number] DOB:November 28, 1950, 72 y.o., male Today's Date: 10/09/2022  PCP: [name], DO REFERRING PROVIDER: Ludwig Clarks, DO  END OF SESSION:  OT End of Session - 10/09/22 1410     Visit Number 1    Number of Visits 7    Date for OT Re-Evaluation 12/04/22   asking for 8 weeks to allow for pt to focus on PT/SLP prior to beginning OT   Authorization Type Medicare A & B    OT Start Time 1235    OT Stop Time 1320    OT Time Calculation (min) 45 min             Past Medical History:  Diagnosis Date   Cervical lymphadenopathy    right - followed by ENT   DOE (dyspnea on exertion) 07/15/2018   Dysphagia, neurologic 07/27/2014   Dystonia 1994   diagnosed in [place]   Enlarged lymph nodes 07/28/2007   Gastroesophageal reflux disease 03/07/2020   Localized swelling of right lower extremity 04/12/2019   Low back pain 11/21/2011   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   Mild neurocognitive disorder due to Parkinson's disease 01/10/2022   Non-Hodgkin lymphoma 2007   diffuse- 6 cycles of chemo with R CHOP Rituxan - last dose 10/08   Nonspecific elevation of levels of transaminase or lactic acid dehydrogenase (LDH) 07/06/2008   Parkinson's disease 07/27/2014   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   Past Surgical History:  Procedure Laterality Date   CHOLECYSTECTOMY, LAPAROSCOPIC     EYE SURGERY     x2 as child   PORT-A-CATH REMOVAL     PORTACATH PLACEMENT     SPLENECTOMY     TONSILLECTOMY     Patient Active Problem List   Diagnosis Date Noted   Mild neurocognitive disorder due to Parkinson's disease 01/10/2022   Gastroesophageal reflux disease 03/07/2020   Localized swelling of right lower extremity 04/12/2019   DOE (dyspnea on exertion) 07/15/2018   10 year risk of MI or stroke 7.5% or greater 04/12/2018   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   Meige  syndrome (blepharospasm with oromandibular dystonia) 07/27/2014   Dysphagia, neurologic 07/27/2014   Parkinson's disease 07/27/2014   Dystonia 07/10/2014   Low back pain 11/21/2011   Nonspecific elevation of levels of transaminase or lactic acid dehydrogenase (LDH) 07/06/2008   Enlarged lymph nodes 07/28/2007   Non-Hodgkin lymphoma 03/23/2007    ONSET DATE: referral date 09/08/22  REFERRING DIAG:  G20.A1 (ICD-10-CM) - Parkinson's disease without dyskinesia or fluctuating manifestations    THERAPY DIAG:  Other symptoms and signs involving the nervous system  Other lack of coordination  Unsteadiness on feet  Abnormal posture  Rationale for Evaluation and Treatment: Rehabilitation  SUBJECTIVE:   SUBJECTIVE STATEMENT: Pt reports that things are going well.  He states that the items that we worked on last time have continued to go well.  Pt reports having a fall on his birthday and no longer having pain but has been unable to get down on his knees since then.  Pt reports that he is utilizing the strategies that he has learned in previous therapy sessions and feels that things are going well overall. Pt accompanied by: self  PERTINENT HISTORY: PD, Chronic pain R shoulder; PISA syndrome    PRECAUTIONS: Fall  WEIGHT BEARING RESTRICTIONS: No  PAIN:  Are you having pain? No  FALLS: Has patient fallen in last 6 months? Yes. Number of falls 1 -  fell forward when exiting bathroom, reporting that his feet froze  LIVING ENVIRONMENT: Lives with: lives with their spouse Lives in: House/apartment Stairs: Yes: External: w/c ramp in the garage, 2 steps at front entrance, 8 steps at back entrance with ability to reach rail on B sides steps; Internal: 3 flights of steps (man cave is on the 3rd floor) Has following equipment at home: Shower bench, Grab bars, and Ramped entry  PLOF: Independent  PATIENT GOALS: to not have to do OT  OBJECTIVE:   HAND DOMINANCE: Right  ADLs: Eating:  reports occasional tremors with self-feeding but that he has adapted.   Grooming: Mod I UB Dressing: reports some trouble pushing button through hole, but will lick fingers to provide increased grip LB Dressing: reports continuing to use strategies from LB dressing, noticing improved ease Toileting: Mod I Bathing: Mod I, utilizing long handled sponge/loofah Tub Shower transfers: Mod I in/out of walk-in shower Equipment: Shower seat with back, Grab bars, Walk in shower, Long handled shoe horn, and Long handled sponge  IADLs: Shopping: Mod I Light housekeeping: spouse performs these tasks Meal Prep: spouse performs these tasks Community mobility: Driving Medication management: spouse fills the pill box, pt utilizes alarms for recall.  May forget if not wearing watch Financial management: spouse takes care of this Handwriting: 90% legible, Mild micrographia, and "[place]: 10.94 sec  MOBILITY STATUS: Freezing  POSTURE COMMENTS:  rounded shoulders, forward head, and weight shift right  ACTIVITY TOLERANCE: Activity tolerance: WFL for tasks assessed on eval  FUNCTIONAL OUTCOME MEASURES: Fastening/unfastening 3 buttons: 29.97 sec Physical performance test: PPT#2 (simulated eating) 18.84 & 11.59 sec - average: 15.22 sec. (pt holding spoon at end of handle) & PPT#4 (donning/doffing jacket): TBD, observed mild increased time when doffing jacket at beginning of session  COORDINATION: 9 Hole Peg test: Right: 33.53 sec; Left: 37.42 sec Box and Blocks:  Right 39 blocks, Left 40 blocks (hitting barrier when completing R side due to decreased amplitude of movements.) Tremors: Resting and reports noticing occasional tremors when eating, however reports utilizing strategies to compensate for tremors when eating  UE ROM:   R shoulder: 142*, L shoulder: 108*  UE MMT:   WFL  COGNITION: Overall cognitive status: Impaired; decreased awareness of deficits and impact on ADLs and  mobility   TODAY'S TREATMENT:                                                          10/09/22 Educated on hand placement and grading pressure on grasp with self-feeding tasks.  Pt demonstrating grasp towards distal end of utensil, OT demonstrating hand placement in middle of utensil and grading pressure to decrease tremors during self-feeding.  Reiterated recommendations for AE to aid in bathing/dressing.   PATIENT EDUCATION: Education details: Educated on role and purpose of OT as well as potential interventions and goals for therapy based on initial evaluation findings. Person educated: Patient Education method: Explanation Education comprehension: verbalized understanding  HOME EXERCISE PROGRAM: TBD  GOALS: Goals reviewed with patient? Yes   LONG TERM GOALS: Target date: 12/04/22 (note: only LTGs due to plan to begin OT in about 3-4 weeks to allow pt to focus treatment on PT and SLP then phase in OT as PT phases out)   Pt will be independent  with PD specific HEP. Baseline:  Goal status: INITIAL  2.  Pt will demonstrate improved shoulder ROM Bilaterally in order to retrieve light weight items from moderate - high shelves. Baseline:  Goal status: INITIAL  3.  Pt will demonstrate improved UE functional use for ADLs as evidenced by increasing box/ blocks score by 3 blocks with [name]/LUE  Baseline:  Goal status: INITIAL  4.  Pt will demonstrate improved shoulder function and mobility to increase independence with donning/doffing jacket with decreasing time by 5 seconds. Baseline: time TBD Goal status: INITIAL   ASSESSMENT:  CLINICAL IMPRESSION: Patient is a 72 y.o. male who was seen today for occupational therapy evaluation for impairments and overall "slowing" of mobility and ROM impacting pt ability to engage in ADLs at [facility]. Pt currently lives with spouse in a 3 story home with ramped entrance as well as entrance with steps with "Man cave" on 3rd floor requiring use of  steps. Pt will benefit from skilled occupational therapy services to address strength and coordination, ROM, pain management, balance, GM/FM control, cognition, safety awareness, introduction of compensatory strategies/AE prn, and implementation of an HEP to improve participation and safety during ADLs and quality of life/leisure pursuits. Pt to begin with focus on PT and SLP as he reports need to prioritize those therapies first due to most impairments and feeling that all 3 at the same time would be too much.  Pt is unsure if he feels the need to engage with OT as he reports that he is utilizing the strategies, and while recognizing that tasks take him a longer time he feels okay with his current status.  OT discussed with pt recommendation of engaging in short stint of OT to focus on quality of movement and UE ROM and strengthening.  Therefore, OT wrote evaluation with intention to begin OT as PT is phased out and will d/c after allotted time if pt decides that he does not want to pursue OT at this time.  Marland Kitchen   PERFORMANCE DEFICITS: in functional skills including ADLs, IADLs, coordination, ROM, strength, Fine motor control, Gross motor control, mobility, balance, body mechanics, endurance, decreased knowledge of precautions, and UE functional use, cognitive skills including attention and safety awareness, and psychosocial skills including environmental adaptation, habits, and routines and behaviors.   IMPAIRMENTS: are limiting patient from ADLs and IADLs.   COMORBIDITIES:  may have co-morbidities  that affects occupational performance. Patient will benefit from skilled OT to address above impairments and improve overall function.  MODIFICATION OR ASSISTANCE TO COMPLETE EVALUATION: No modification of tasks or assist necessary to complete an evaluation.  OT OCCUPATIONAL PROFILE AND HISTORY: Problem focused assessment: Including review of records relating to presenting problem.  CLINICAL DECISION MAKING:  LOW - limited treatment options, no task modification necessary  REHAB POTENTIAL: Good  EVALUATION COMPLEXITY: Low    PLAN:  OT FREQUENCY: 2x/week  OT DURATION: 8 weeks (4 weeks of active therapy after PT concludes)  PLANNED INTERVENTIONS: self care/ADL training, therapeutic exercise, therapeutic activity, neuromuscular re-education, passive range of motion, balance training, functional mobility training, ultrasound, compression bandaging, moist heat, cryotherapy, patient/family education, cognitive remediation/compensation, psychosocial skills training, energy conservation, coping strategies training, and DME and/or AE instructions  RECOMMENDED OTHER SERVICES: NA  CONSULTED AND AGREED WITH PLAN OF CARE: Patient  PLAN FOR NEXT SESSION: Review large amplitude exercises, focus on shoulder ROM with functional reach and donning/doffing jacket   Brian Zeitlin, OTR/L 10/09/2022, 2:12 PM   OCCUPATIONAL THERAPY DISCHARGE SUMMARY  Visits from  Start of Care: 1  Current functional level related to goals / functional outcomes: Unsure as pt did not return for OT sessions.  Pt initiated PT and SLP as he reports need to prioritize those therapies first due to most impairments and feeling that all 3 at the same time would be too much.  Pt reports that he is utilizing the strategies, and while recognizing that tasks take him a longer time he feels okay with his current status.  Due to not resuming OT at conclusion of PT/SLP, pt will be discharged from OT   Remaining deficits: See above   Education / Equipment: Hand placement to increase motor control with ADLs, recommendation for OT   Patient agrees to discharge. Patient goals were not met. Patient is being discharged due to not returning since the last visit.  Rosalio Loud, OT 03/12/23

## 2022-10-09 NOTE — Therapy (Signed)
OUTPATIENT PHYSICAL THERAPY NEURO EVALUATION   Patient Name: [name] [name] MRN: [id number] DOB:07/22/1951, 72 y.o., male Today's Date: 10/09/2022   PCP: Riki Sheer, DO REFERRING PROVIDER: Alonza Bogus, DO  END OF SESSION:  PT End of Session - 10/09/22 1318     Visit Number 1    Number of Visits 13    Date for PT Re-Evaluation 11/21/22    Authorization Type Medicare/BCBS    PT Start Time 1321    PT Stop Time 1401    PT Time Calculation (min) 40 min    Activity Tolerance Patient tolerated treatment well    Behavior During Therapy [facility] for tasks assessed/performed             Past Medical History:  Diagnosis Date   Cervical lymphadenopathy    right - followed by ENT   DOE (dyspnea on exertion) 07/15/2018   Dysphagia, neurologic 07/27/2014   Dystonia 1994   diagnosed in [place]   Enlarged lymph nodes 07/28/2007   Gastroesophageal reflux disease 03/07/2020   Localized swelling of right lower extremity 04/12/2019   Low back pain 11/21/2011   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   Mild neurocognitive disorder due to Parkinson's disease 01/10/2022   Non-Hodgkin lymphoma 2007   diffuse- 6 cycles of chemo with R CHOP Rituxan - last dose 10/08   Nonspecific elevation of levels of transaminase or lactic acid dehydrogenase (LDH) 07/06/2008   Parkinson's disease 07/27/2014   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   Past Surgical History:  Procedure Laterality Date   CHOLECYSTECTOMY, LAPAROSCOPIC     EYE SURGERY     x2 as child   PORT-A-CATH REMOVAL     PORTACATH PLACEMENT     SPLENECTOMY     TONSILLECTOMY     Patient Active Problem List   Diagnosis Date Noted   Mild neurocognitive disorder due to Parkinson's disease 01/10/2022   Gastroesophageal reflux disease 03/07/2020   Localized swelling of right lower extremity 04/12/2019   DOE (dyspnea on exertion) 07/15/2018   10 year risk of MI or stroke 7.5% or greater 04/12/2018    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   Dysphagia, neurologic 07/27/2014   Parkinson's disease 07/27/2014   Dystonia 07/10/2014   Low back pain 11/21/2011   Nonspecific elevation of levels of transaminase or lactic acid dehydrogenase (LDH) 07/06/2008   Enlarged lymph nodes 07/28/2007   Non-Hodgkin lymphoma 03/23/2007    ONSET DATE: 09/08/2022 (MD referral)  REFERRING DIAG: G20.A1 (ICD-10-CM) - Parkinson's disease without dyskinesia or fluctuating manifestations   THERAPY DIAG:  Unsteadiness on feet  Other abnormalities of gait and mobility  Abnormal posture  Other symptoms and signs involving the nervous system  Rationale for Evaluation and Treatment: Rehabilitation  SUBJECTIVE:  SUBJECTIVE STATEMENT: I did have a fall.  In the bathroom late at night trying to turn and feet got stuck.  Knee hit the tile floor and it was swollen.  Has healed and it's okay with no pain.  Feel like I'm getting weaker when I go up the steps and more off balance. (1/2 way up go from step through pattern to step-to pattern).  Feet getting stuck happens daily, especially tight spaces and doorways. Pt accompanied by: self  PERTINENT HISTORY: See PMH above  PAIN:  Are you having pain? No  PRECAUTIONS: Fall  WEIGHT BEARING RESTRICTIONS: No  FALLS: Has patient fallen in last 6 months? Yes. Number of falls 1  LIVING ENVIRONMENT: Lives with: lives with their family and lives with their spouse Lives in: House/apartment Stairs:  full flight to 2nd floor with bedroom and additional flight to 3rd floor man cave Has following equipment at home: Single point cane and Walker - 2 wheeled  PLOF: Independent  Not doing exercises and has not been attending [facility]!  Moves class  PATIENT GOALS: Getting more strength back  OBJECTIVE:   DIAGNOSTIC FINDINGS: NA  COGNITION: Overall cognitive status: Within functional limits for tasks assessed.  Per neurocognitive testing in 2023:  [facility] *Lower voice volume noted in eval conversations*  SENSATION: NT  POSTURE: rounded shoulders, forward head, and posterior pelvic tilt  Leans to right in unsupported sitting.  LOWER EXTREMITY ROM:   WFL A/ROM, seated position; P/ROM notes tightness bilateral hamstrings   LOWER EXTREMITY MMT:    MMT Right Eval Left Eval  Hip flexion 4 4+  Hip extension    Hip abduction    Hip adduction    Hip internal rotation    Hip external rotation    Knee flexion 4 4  Knee extension 4 4  Ankle dorsiflexion 3+ 3+  Ankle plantarflexion    Ankle inversion    Ankle eversion    (Blank rows = not tested)    TRANSFERS: Assistive device utilized: None  Sit to stand: CGA Stand to sit: CGA  STAIRS: Level of Assistance: Modified independence Stair Negotiation Technique: Alternating Pattern  with Bilateral Rails Number of Stairs: 3  Height of Stairs: 4"-6"  Comments: Pt does report that at home he has to change from step through to step-to pattern about half way up the steps, due to leg weakness.  GAIT: Gait pattern: step through pattern, shuffling, festinating, poor foot clearance- Right, and poor foot clearance- Left Distance walked: 50 ft x 4 Assistive device utilized: None Level of assistance: SBA Comments: Festinating noted at doorways and with turns to change direction  FUNCTIONAL TESTS:  5 times sit to stand: 10.87 sec, arms crossed at chest, retropulsion  Timed up and go (TUG): 11.97 sec 10 meter walk test: 11.68 sec = 2.81 ft/sec TUG cognitive:  12.03 sec TUG manual:  11.78 sec  TODAY'S TREATMENT:  DATE: 10/09/2022    PATIENT  EDUCATION: Education details: PT eval results, POC; tips to reduce festination with doors/turns (through [place] ahead at target or count steps, with turns, march or weightshift) Person educated: Patient Education method: Explanation, Demonstration, and Verbal cues Education comprehension: verbalized understanding, returned demonstration, and needs further education  HOME EXERCISE PROGRAM: Not yet initiated  GOALS: Goals reviewed with patient? Yes  SHORT TERM GOALS: Target date: 11/07/2022  Pt will be independent with HEP for improved balance, gait, functional strength. Baseline:  Not currently performing exercises Goal status: INITIAL  2.  Pt will verbalize tips to reduce freezing/festination with gait, doorways, and turns.  Baseline:  Goal status: INITIAL  3.  Pt will negotiate full flight of steps with handrail with step through pattern, to demonstrate improved functional strength. Baseline: reports half step-through and half step-to pattern Goal status: INITIAL  LONG TERM GOALS: Target date: 11/21/2022  Pt will be independent with HEP for improved strength, balance, gait. Baseline:  Goal status: INITIAL  2.  Pt will improve MiniBESTest score to at least 24/28 to decrease fall risk. Baseline:  not assessed at eval due to time constraints, was 24/28 at last PT bout 02/2022  Goal status: INITIAL  3.  Pt will perform 5x sit<>stand, 2 of 3 trials with no retropulsion, to demonstrate improved transfer efficiency and functional lower extremity strength. Baseline:  Goal status: INITIAL  4.  Pt will report 50% improvement in sit<>stand from low surfaces in home. Baseline:  Goal status: INITIAL  ASSESSMENT:  CLINICAL IMPRESSION: Patient is a 72 y.o. male who was seen today for physical therapy evaluation and treatment for Parkinson's disease.  Today's evaluation is a recommended follow-up evaluation from previous discharge in July 2023.  He presents to OPPT today reporting  increased difficulty with low surface transfers, increased forward and right lateral flexed posture, increased freezing episodes in doorways and with turns.  He has had one fall in the past 6 months.  He presents to OPPT with abnormal posture, decreased functional strength, decreased balance, decreased timing and coordination of gait.  He will benefit from skilled PT to address the above stated deficits to decrease fall risk and improve overall functional mobility.  OBJECTIVE IMPAIRMENTS: Abnormal gait, decreased balance, decreased knowledge of use of DME, decreased mobility, difficulty walking, decreased strength, impaired flexibility, and postural dysfunction.   ACTIVITY LIMITATIONS: standing, stairs, transfers, locomotion level, and caring for others  PARTICIPATION LIMITATIONS: community activity and hobbies  PERSONAL FACTORS: 3+ comorbidities: PMH above  are also affecting patient's functional outcome.   REHAB POTENTIAL: Good  CLINICAL DECISION MAKING: Evolving/moderate complexity  EVALUATION COMPLEXITY: Moderate  PLAN:  PT FREQUENCY: 2x/week  PT DURATION: 6 weeks plus eval visit  PLANNED INTERVENTIONS: Therapeutic exercises, Therapeutic activity, Neuromuscular re-education, Balance training, Gait training, Patient/Family education, Self Care, Stair training, and DME instructions  PLAN FOR NEXT SESSION: Functional strengthening:  sit<>stand, step ups, SLS.  Perform MiniBESTest.  Work on weightshifting, SLS, posture, tips to reduce freezing with gait, doorways, turns   Tamra Koos W., PT 10/10/2022, 12:06 PM  [facility] at [facility], [place], [place] [address] Phone # [phone] Fax # [phone]

## 2022-10-20 ENCOUNTER — Ambulatory Visit: Payer: Medicare Other

## 2022-10-20 DIAGNOSIS — R2689 Other abnormalities of gait and mobility: Secondary | ICD-10-CM

## 2022-10-20 DIAGNOSIS — R2681 Unsteadiness on feet: Secondary | ICD-10-CM

## 2022-10-20 DIAGNOSIS — R41841 Cognitive communication deficit: Secondary | ICD-10-CM

## 2022-10-20 DIAGNOSIS — R293 Abnormal posture: Secondary | ICD-10-CM | POA: Diagnosis not present

## 2022-10-20 DIAGNOSIS — R471 Dysarthria and anarthria: Secondary | ICD-10-CM

## 2022-10-20 DIAGNOSIS — R29818 Other symptoms and signs involving the nervous system: Secondary | ICD-10-CM

## 2022-10-20 DIAGNOSIS — R278 Other lack of coordination: Secondary | ICD-10-CM | POA: Diagnosis not present

## 2022-10-20 NOTE — Therapy (Addendum)
OUTPATIENT PHYSICAL THERAPY NEURO TREATMENT   Patient Name: [name] [name] MRN: [id number] DOB:1950/10/24, 72 y.o., male Today's Date: 10/09/2022   PCP: Riki Sheer, DO REFERRING PROVIDER: Alonza Bogus, DO  END OF SESSION:  PT End of Session - 10/20/22 1012     Visit Number 2    Number of Visits 13    Date for PT Re-Evaluation 11/21/22    Authorization Type Medicare/BCBS    PT Start Time 1015    PT Stop Time 1100    PT Time Calculation (min) 45 min    Activity Tolerance Patient tolerated treatment well    Behavior During Therapy [facility] for tasks assessed/performed             Past Medical History:  Diagnosis Date   Cervical lymphadenopathy    right - followed by ENT   DOE (dyspnea on exertion) 07/15/2018   Dysphagia, neurologic 07/27/2014   Dystonia 1994   diagnosed in [place]   Enlarged lymph nodes 07/28/2007   Gastroesophageal reflux disease 03/07/2020   Localized swelling of right lower extremity 04/12/2019   Low back pain 11/21/2011   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   Mild neurocognitive disorder due to Parkinson's disease 01/10/2022   Non-Hodgkin lymphoma 2007   diffuse- 6 cycles of chemo with R CHOP Rituxan - last dose 10/08   Nonspecific elevation of levels of transaminase or lactic acid dehydrogenase (LDH) 07/06/2008   Parkinson's disease 07/27/2014   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   Past Surgical History:  Procedure Laterality Date   CHOLECYSTECTOMY, LAPAROSCOPIC     EYE SURGERY     x2 as child   PORT-A-CATH REMOVAL     PORTACATH PLACEMENT     SPLENECTOMY     TONSILLECTOMY     Patient Active Problem List   Diagnosis Date Noted   Mild neurocognitive disorder due to Parkinson's disease 01/10/2022   Gastroesophageal reflux disease 03/07/2020   Localized swelling of right lower extremity 04/12/2019   DOE (dyspnea on exertion) 07/15/2018   10 year risk of MI or stroke 7.5% or greater 04/12/2018    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   Dysphagia, neurologic 07/27/2014   Parkinson's disease 07/27/2014   Dystonia 07/10/2014   Low back pain 11/21/2011   Nonspecific elevation of levels of transaminase or lactic acid dehydrogenase (LDH) 07/06/2008   Enlarged lymph nodes 07/28/2007   Non-Hodgkin lymphoma 03/23/2007    ONSET DATE: 09/08/2022 (MD referral)  REFERRING DIAG: G20.A1 (ICD-10-CM) - Parkinson's disease without dyskinesia or fluctuating manifestations   THERAPY DIAG:  Unsteadiness on feet  Other abnormalities of gait and mobility  Abnormal posture  Other symptoms and signs involving the nervous system  Rationale for Evaluation and Treatment: Rehabilitation  SUBJECTIVE:  SUBJECTIVE STATEMENT: "Feeling a bit off today, feeling a bit out of sorts"  Pt accompanied by: self  PERTINENT HISTORY: See PMH above  PAIN:  Are you having pain? No  PRECAUTIONS: Fall  WEIGHT BEARING RESTRICTIONS: No  FALLS: Has patient fallen in last 6 months? Yes. Number of falls 1  LIVING ENVIRONMENT: Lives with: lives with their family and lives with their spouse Lives in: House/apartment Stairs:  full flight to 2nd floor with bedroom and additional flight to 3rd floor man cave Has following equipment at home: Single point cane and Walker - 2 wheeled  PLOF: Independent  Not doing exercises and has not been attending [employer] Moves class  PATIENT GOALS: Getting more strength back  OBJECTIVE:   TODAY'S TREATMENT: 10/20/22 Activity Comments  Mini-BESTest 22/28  Pt education Techniques for ROM and progressive kneeling activity for home; e.g. tall-kneeling on cushioned surface to enable improve tolerance for position for working on  Engineer, technical sales retractions 10x3 sec hold Cues for 70% of max force  Sit-stand 3x5 reps           HOME EXERCISE PROGRAM: Access Code: [email or web address] URL: [email or web address] Date: 10/20/2022 Prepared by: Sherlyn Lees  Exercises - Seated Cervical Retraction  - 1 x daily - 7 x weekly - 5-10 reps - 3 sec hold - Supine Passive Cervical Retraction  - 1 x daily - 7 x weekly - 10 reps - 3 sec hold - Sit to Stand Without Arm Support  - 1 x daily - 7 x weekly - 5 sets - 5 reps  PATIENT EDUCATION: Education details: mini-BESTest results; reinforced tips to reduce festination with doors/turns (through [place] ahead at target or count steps, with turns, march or weightshift) Person educated: Patient Education method: Explanation, Demonstration, and Verbal cues Education comprehension: verbalized understanding, returned demonstration, and needs further education  DIAGNOSTIC FINDINGS: NA  COGNITION: Overall cognitive status: Within functional limits for tasks assessed.  Per neurocognitive testing in 2023:  [facility] *Lower voice volume noted in eval conversations*  SENSATION: NT  POSTURE: rounded shoulders, forward head, and posterior pelvic tilt  Leans to right in unsupported sitting.  LOWER EXTREMITY ROM:   WFL A/ROM, seated position; P/ROM notes tightness bilateral hamstrings   LOWER EXTREMITY MMT:    MMT Right Eval Left Eval  Hip flexion 4 4+  Hip extension    Hip abduction    Hip adduction    Hip internal rotation    Hip external rotation    Knee flexion 4 4  Knee extension 4 4  Ankle dorsiflexion 3+ 3+  Ankle plantarflexion    Ankle inversion    Ankle eversion    (Blank rows = not tested)    TRANSFERS: Assistive device utilized: None  Sit to stand: CGA Stand to sit: CGA  STAIRS: Level of Assistance: Modified independence Stair Negotiation Technique: Alternating Pattern  with Bilateral Rails Number of Stairs: 3  Height of Stairs: 4"-6"  Comments:  Pt does report that at home he has to change from step through to step-to pattern about half way up the steps, due to leg weakness.  GAIT: Gait pattern: step through pattern, shuffling, festinating, poor foot clearance- Right, and poor foot clearance- Left Distance walked: 50 ft x 4 Assistive device utilized: None Level of assistance: SBA Comments: Festinating noted at doorways and with turns to change direction  FUNCTIONAL TESTS:  5 times sit to stand: 10.87 sec, arms crossed at chest, retropulsion  Timed up and go (TUG): 11.97 sec 10  meter walk test: 11.68 sec = 2.81 ft/sec TUG cognitive:  12.03 sec TUG manual:  11.78 sec  TODAY'S TREATMENT:                                                                                                                              DATE: 10/09/2022        GOALS: Goals reviewed with patient? Yes  SHORT TERM GOALS: Target date: 11/07/2022  Pt will be independent with HEP for improved balance, gait, functional strength. Baseline:  Not currently performing exercises Goal status: IN PROGRESS  2.  Pt will verbalize tips to reduce freezing/festination with gait, doorways, and turns.  Baseline:  Goal status: IN PROGRESS  3.  Pt will negotiate full flight of steps with handrail with step through pattern, to demonstrate improved functional strength. Baseline: reports half step-through and half step-to pattern Goal status: IN PROGRESS  LONG TERM GOALS: Target date: 11/21/2022  Pt will be independent with HEP for improved strength, balance, gait. Baseline:  Goal status: IN PROGRESS  2.  Pt will improve MiniBESTest score to at least 24/28 to decrease fall risk. Baseline:  22/28 Goal status: IN PROGRESS  3.  Pt will perform 5x sit<>stand, 2 of 3 trials with no retropulsion, to demonstrate improved transfer efficiency and functional lower extremity strength. Baseline:  Goal status: IN PROGRESS  4.  Pt will report 50% improvement in sit<>stand  from low surfaces in home. Baseline:  Goal status: IN PROGRESS  ASSESSMENT:  CLINICAL IMPRESSION: Initiated with mini-BESTest and score of 22/28 which is a decrease of previous 24/28 and demonstrating difficulty during turning and sudden change of direction with festination of feet present. Initiated HEP development today with emphasis on improved posture for head-up position for improved balance and sit-stand to progress resistance activities. Continued sessions to advance balance, motor planning/control, and gait activities to reduce risk for falls  OBJECTIVE IMPAIRMENTS: Abnormal gait, decreased balance, decreased knowledge of use of DME, decreased mobility, difficulty walking, decreased strength, impaired flexibility, and postural dysfunction.   ACTIVITY LIMITATIONS: standing, stairs, transfers, locomotion level, and caring for others  PARTICIPATION LIMITATIONS: community activity and hobbies  PERSONAL FACTORS: 3+ comorbidities: PMH above  are also affecting patient's functional outcome.   REHAB POTENTIAL: Good  CLINICAL DECISION MAKING: Evolving/moderate complexity  EVALUATION COMPLEXITY: Moderate  PLAN:  PT FREQUENCY: 2x/week  PT DURATION: 6 weeks plus eval visit  PLANNED INTERVENTIONS: Therapeutic exercises, Therapeutic activity, Neuromuscular re-education, Balance training, Gait training, Patient/Family education, Self Care, Stair training, and DME instructions  PLAN FOR NEXT SESSION: Functional strengthening:   step ups, SLS.  Marland Kitchen  Work on [place], SLS, posture, tips to reduce freezing with gait, doorways, turns   11:16 AM, 10/20/22 M. Sherlyn Lees, PT, DPT Physical Therapist- [place] Office Number: [phone]   [facility] [address] [place], [place] [address] Phone # [phone] Fax # [phone]

## 2022-10-20 NOTE — Therapy (Signed)
OUTPATIENT SPEECH LANGUAGE PATHOLOGY PARKINSON'S TREATMENT   Patient Name: [name] [name] MRN: [id number] DOB:11-Jul-1951, 72 y.o., male Today's Date: 10/20/2022  PCP: Dr. Deloris [name] REFERRING PROVIDER: Tat, [name] Guiles, DO   End of Session - 10/20/22 1019     Visit Number 2    Number of Visits 17    Date for SLP Re-Evaluation 12/18/22    SLP Start Time 0941   10 minutes late   SLP Stop Time  1015    SLP Time Calculation (min) 34 min    Activity Tolerance Patient tolerated treatment well               Past Medical History:  Diagnosis Date   Cervical lymphadenopathy    right - followed by ENT   DOE (dyspnea on exertion) 07/15/2018   Dysphagia, neurologic 07/27/2014   Dystonia 1994   diagnosed in [place]   Enlarged lymph nodes 07/28/2007   Gastroesophageal reflux disease 03/07/2020   Localized swelling of right lower extremity 04/12/2019   Low back pain 11/21/2011   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   Mild neurocognitive disorder due to Parkinson's disease 01/10/2022   Non-Hodgkin lymphoma 2007   diffuse- 6 cycles of chemo with R CHOP Rituxan - last dose 10/08   Nonspecific elevation of levels of transaminase or lactic acid dehydrogenase (LDH) 07/06/2008   Parkinson's disease 07/27/2014   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   Past Surgical History:  Procedure Laterality Date   CHOLECYSTECTOMY, LAPAROSCOPIC     EYE SURGERY     x2 as child   PORT-A-CATH REMOVAL     PORTACATH PLACEMENT     SPLENECTOMY     TONSILLECTOMY     Patient Active Problem List   Diagnosis Date Noted   Mild neurocognitive disorder due to Parkinson's disease 01/10/2022   Gastroesophageal reflux disease 03/07/2020   Localized swelling of right lower extremity 04/12/2019   DOE (dyspnea on exertion) 07/15/2018   10 year risk of MI or stroke 7.5% or greater 04/12/2018   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   Meige syndrome (blepharospasm with oromandibular dystonia)  07/27/2014   Dysphagia, neurologic 07/27/2014   Parkinson's disease 07/27/2014   Dystonia 07/10/2014   Low back pain 11/21/2011   Nonspecific elevation of levels of transaminase or lactic acid dehydrogenase (LDH) 07/06/2008   Enlarged lymph nodes 07/28/2007   Non-Hodgkin lymphoma 03/23/2007    ONSET DATE: Dx in mid-2010's  REFERRING DIAG: G20 - Parkinson's disease   THERAPY DIAG:  Dysarthria and anarthria  Cognitive communication deficit  Rationale for Evaluation and Treatment Rehabilitation  SUBJECTIVE:   SUBJECTIVE STATEMENT: "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 No  PATIENT GOALS: "I want to get back to where I was."  OBJECTIVE:   DIAGNOSTIC FINDINGS: From neuropsych note from April 2023: "[name] completed a comprehensive neuropsychological evaluation on 01/10/2022. Please refer to that encounter for the full report and recommendations. Briefly, results suggested isolated impaired performances across complex attention and semantic fluency. Performance variability was also exhibited across all aspects of learning and memory. Regarding etiology, the most likely culprit for ongoing cognitive weakness remains his past history of Parkinson's disease. While processing speed was improved relative to what might be expected, dysfunction surrounding complex attention and encoding/retrieval deficits of memory are quite common in this illness. The fact that motor/gait dysfunction was noted years prior to concern surrounding cognitive decline is also a typical timeline  associated with Parkinson's disease. Mild anxiety and sleep dysfunction could further influence cognitive performances. Research surrounding cognitive side effects of pramipexole is mixed; however, I cannot rule out an ongoing side effect contribution as well."   OBJECTIVE ASSESSMENT:  Measured when a sound level meter was placed 30  cm away from pt's masked mouth, 10 minutes of simple conversational speech was reduced today, at average low 60s dB (WNL= average 70-72dB) with range of 59-67dB. Overall speech intelligibility for this listener is 95-100%. Production of loud /a/ averaged low 90s dB (range of 86 to 95dB) and initial rare min cues needed for loudness.   Pt rated effort level at 8/10 for production of loud /a/ (10=maximal effort). In 2 minutes short conversation task pt was asked to use the same amount of effort as with loud /a/. Loudness average with this increased effort was mid-upper 60s dB (range of 60 to 71dB) with occasional min verbal cues for loudness. Pt would benefit from skilled ST in order to improve speech intelligibility and pt's QOL.  [name]'s biggest concern at this time is his speech, as he tells SLP he is compensating well for attention and memory. However he does have concern about remembering people's names saying "I was never really good with names, but I have more trouble now."  Pt does not report difficulty with swallowing warranting further evaluation however SLP to monitor pt and inquire again about this during this therapy course.    PATIENT REPORTED OUTCOME MEASURES (PROM): Communication Effectiveness Survey: Pt scored 18/32 on this survey (pt scored 29/32 at discharge in August 2023).   Patient does not report any communication on the survey as 4 ("very effective"). He reports conversing with a familiar person over the telephone, being part of a conversation in a noisy environment, and speaking to a friend when he is emotionally upset as 3 ("mostly effective"). Scoring 2, or "Somewhat effective" was having a conversation with the family member or friends at home, participating in conversation with strangers in a quiet place, conversing with a stranger over the telephone, and having a conversation while traveling in a car. He rated 1 ("not at all effective") having a conversation with someone at a  distance, like across the room.  TODAY'S TREATMENT:  10/20/22: Loud /a/ measurements average low-mid 90s dB, everyday sentences were at mid low-80s dB. Sentence level responses today were average mid 60s dB with occasional min-mod cues for loudness, with longer responses. Conversational segments were produced with occasional mod cues for loudness decay.   10/09/22: SLP reviewed the rationale for BID completion of loud /a/ and everyday sentences. SLP ensured pt could obtain comparable loud /a/ measurements at d/c last course and he did so, with average low 90s dB. "I'm amazed I can still get up there that loud," pt told SLP. Pt stated everyday sentences at low 80s dB, comparable to previous course. Discussed goals and pt agreed.    PATIENT EDUCATION: Education details: see above in "today's treatment" Person educated: Patient Education method: Explanation, Demonstration, and Verbal cues Education comprehension: verbalized understanding and return demonstrated   HOME EXERCISE PROGRAM: Loud /a/ x6 BID, and everyday sentences.   GOALS: Goals reviewed with patient? Yes  SHORT TERM GOALS: Target date: 11/13/22    pt will produce loud /a/ with at least low 90s dB average over three sessions Baseline: Goal status: Ongoing  2.  Pt will produce 16/20 sentence responses with 70dB over two sessions Baseline:  Goal status: Ongoing  3.  Pt  will produce 5 minutes simple conversation with volume upper 60s dB average over three sessions Baseline:  Goal status: Ongoing  4.  pt will generate abdominal breathing 80% of the time when engaging in 5 minutes simple conversation in 2 sessions Baseline:  Goal status: Ongoing    LONG TERM GOALS: Target date: 12/18/22    Pt will score higher than 18/32 on CES in the final two ST sessions Baseline:  Goal status: Ongoing  2.  Pt will report successful usage of strategies for recall of people's names in 2 sessions Baseline:  Goal status: Ongoing  3.   Pt will maintain average of upper 60s dB over 10 minute simple-mod complex conversation with rare min A in three sessions  Baseline:  Goal status: Ongoing  4.  pt will use abdominal breathing 75% of the time in 10 minutes simple-mod complex conversation in three sessions  Baseline:  Goal status: Ongoing   ASSESSMENT:  CLINICAL IMPRESSION: Patient is a 72 y.o. male who was seen today for treatment of speech clarity who demonstrated mod dysarthria and reduced breath support due to Parkinson's disease. See tx note from today. Pt would cont to benefit from skilled ST targeting loudness in conversation, and strategies for recalling people's names.  OBJECTIVE IMPAIRMENTS  Objective impairments include memory and dysarthria. These impairments are limiting patient from managing medications, household responsibilities, and effectively communicating at home and in community.Factors affecting potential to achieve goals and functional outcome are previous level of function and severity of impairments.. Patient will benefit from skilled SLP services to address above impairments and improve overall function.  REHAB POTENTIAL: Good  PLAN: SLP FREQUENCY: 2x/week  SLP DURATION: 8 weeks  PLANNED INTERVENTIONS: Environmental controls, Cueing hierachy, Internal/external aids, Functional tasks, Multimodal communication approach, SLP instruction and feedback, and Compensatory strategies    Cartier Washko, CCC-SLP 10/20/2022, 10:19 AM

## 2022-10-22 ENCOUNTER — Ambulatory Visit: Payer: Medicare Other | Admitting: Physical Therapy

## 2022-10-22 ENCOUNTER — Encounter: Payer: Self-pay | Admitting: Physical Therapy

## 2022-10-22 DIAGNOSIS — R2689 Other abnormalities of gait and mobility: Secondary | ICD-10-CM | POA: Diagnosis not present

## 2022-10-22 DIAGNOSIS — R2681 Unsteadiness on feet: Secondary | ICD-10-CM

## 2022-10-22 DIAGNOSIS — R278 Other lack of coordination: Secondary | ICD-10-CM | POA: Diagnosis not present

## 2022-10-22 DIAGNOSIS — R471 Dysarthria and anarthria: Secondary | ICD-10-CM | POA: Diagnosis not present

## 2022-10-22 DIAGNOSIS — R293 Abnormal posture: Secondary | ICD-10-CM

## 2022-10-22 DIAGNOSIS — R29818 Other symptoms and signs involving the nervous system: Secondary | ICD-10-CM | POA: Diagnosis not present

## 2022-10-22 NOTE — Therapy (Signed)
OUTPATIENT PHYSICAL THERAPY NEURO TREATMENT   Patient Name: [name] [name] MRN: [id number] DOB:August 08, 1951, 72 y.o., male Today's Date: 10/09/2022   PCP: Riki Sheer, DO REFERRING PROVIDER: Alonza Bogus, DO  END OF SESSION:  PT End of Session - 10/22/22 1318     Visit Number 3    Number of Visits 13    Date for PT Re-Evaluation 11/21/22    Authorization Type Medicare/BCBS    Progress Note Due on Visit 10    PT Start Time 1319    PT Stop Time 1400    PT Time Calculation (min) 41 min    Activity Tolerance Patient tolerated treatment well    Behavior During Therapy [facility] for tasks assessed/performed             Past Medical History:  Diagnosis Date   Cervical lymphadenopathy    right - followed by ENT   DOE (dyspnea on exertion) 07/15/2018   Dysphagia, neurologic 07/27/2014   Dystonia 1994   diagnosed in [place]   Enlarged lymph nodes 07/28/2007   Gastroesophageal reflux disease 03/07/2020   Localized swelling of right lower extremity 04/12/2019   Low back pain 11/21/2011   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   Mild neurocognitive disorder due to Parkinson's disease 01/10/2022   Non-Hodgkin lymphoma 2007   diffuse- 6 cycles of chemo with R CHOP Rituxan - last dose 10/08   Nonspecific elevation of levels of transaminase or lactic acid dehydrogenase (LDH) 07/06/2008   Parkinson's disease 07/27/2014   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   Past Surgical History:  Procedure Laterality Date   CHOLECYSTECTOMY, LAPAROSCOPIC     EYE SURGERY     x2 as child   PORT-A-CATH REMOVAL     PORTACATH PLACEMENT     SPLENECTOMY     TONSILLECTOMY     Patient Active Problem List   Diagnosis Date Noted   Mild neurocognitive disorder due to Parkinson's disease 01/10/2022   Gastroesophageal reflux disease 03/07/2020   Localized swelling of right lower extremity 04/12/2019   DOE (dyspnea on exertion) 07/15/2018   10 year risk of MI or stroke 7.5% or  greater 04/12/2018   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   Dysphagia, neurologic 07/27/2014   Parkinson's disease 07/27/2014   Dystonia 07/10/2014   Low back pain 11/21/2011   Nonspecific elevation of levels of transaminase or lactic acid dehydrogenase (LDH) 07/06/2008   Enlarged lymph nodes 07/28/2007   Non-Hodgkin lymphoma 03/23/2007    ONSET DATE: 09/08/2022 (MD referral)  REFERRING DIAG: G20.A1 (ICD-10-CM) - Parkinson's disease without dyskinesia or fluctuating manifestations   THERAPY DIAG:  Unsteadiness on feet  Other abnormalities of gait and mobility  Abnormal posture  Rationale for Evaluation and Treatment: Rehabilitation  SUBJECTIVE:  SUBJECTIVE STATEMENT: Notice that I'm not up on my toes as I focus more on the posture. Pt accompanied by: self  PERTINENT HISTORY: See PMH above  PAIN:  Are you having pain? No  PRECAUTIONS: Fall  WEIGHT BEARING RESTRICTIONS: No  FALLS: Has patient fallen in last 6 months? Yes. Number of falls 1  LIVING ENVIRONMENT: Lives with: lives with their family and lives with their spouse Lives in: House/apartment Stairs:  full flight to 2nd floor with bedroom and additional flight to 3rd floor man cave Has following equipment at home: Single point cane and Walker - 2 wheeled  PLOF: Independent  Not doing exercises and has not been attending [employer] Moves class  PATIENT GOALS: Getting more strength back  OBJECTIVE:    TODAY'S TREATMENT: 10/22/2022 Activity Comments  Reviewed sit to stand and neck retraction as HEP from last visit Return demo understanding  Seated scapular retraction, no resistance, x 10, then with green theraband x 10   Standing scapular retraction 5 reps Cues for  technique in doorframe  Doorway, corner stretch, 3 reps, 10-15" each   Stagger stance forward/back rocking x 15 reps  BUE support, cues to hinge at hips  Forward step ups:  step up/up down/down x 10 reps BUE support, cues for lightened UE support  Single leg forward step ups x 10 reps BUE support, 3 sec hold  NuStep, Level 4, 4 extremities x 6 minutes Cues to keep RPM >80-90 for strengthening and flexibility.   PATIENT EDUCATION: Education details: HEP additions Person educated: Patient Education method: Explanation, Demonstration, and Handouts Education comprehension: verbalized understanding, returned demonstration, and needs further education   Access Code: [email or web address] URL: [email or web address] Date: 10/22/2022 Prepared by: [facility]  Exercises - Seated Cervical Retraction  - 1 x daily - 7 x weekly - 5-10 reps - 3 sec hold - Supine Passive Cervical Retraction  - 1 x daily - 7 x weekly - 10 reps - 3 sec hold - Sit to Stand Without Arm Support  - 1 x daily - 7 x weekly - 5 sets - 5 reps - Scapular Retraction with Resistance  - 1 x daily - 5 x weekly - 2-3 sets - 10 reps - Doorway Pec Stretch at 60 Elevation  - 1 x daily - 5 x weekly - 1 sets - 3 reps - 15 sec hold - Forward Step Up  - 1 x daily - 5 x weekly - 2 sets - 10 reps  -------------------------------------------------------------- Objective measures below taken at initial evaluation:  DIAGNOSTIC FINDINGS: NA  COGNITION: Overall cognitive status: Within functional limits for tasks assessed.  Per neurocognitive testing in 2023:  [facility] *Lower voice volume noted in eval conversations*  SENSATION: NT  POSTURE: rounded shoulders, forward head, and posterior pelvic tilt  Leans to right in unsupported sitting.  LOWER EXTREMITY ROM:   WFL A/ROM, seated position; P/ROM notes tightness bilateral hamstrings   LOWER EXTREMITY MMT:    MMT Right Eval Left Eval  Hip flexion 4  4+  Hip extension    Hip abduction    Hip adduction    Hip internal rotation    Hip external rotation    Knee flexion 4 4  Knee extension 4 4  Ankle dorsiflexion 3+ 3+  Ankle plantarflexion    Ankle inversion    Ankle eversion    (Blank rows = not tested)    TRANSFERS: Assistive device utilized: None  Sit to stand: CGA  Stand to sit: CGA  STAIRS: Level of Assistance: Modified independence Stair Negotiation Technique: Alternating Pattern  with Bilateral Rails Number of Stairs: 3  Height of Stairs: 4"-6"  Comments: Pt does report that at home he has to change from step through to step-to pattern about half way up the steps, due to leg weakness.  GAIT: Gait pattern: step through pattern, shuffling, festinating, poor foot clearance- Right, and poor foot clearance- Left Distance walked: 50 ft x 4 Assistive device utilized: None Level of assistance: SBA Comments: Festinating noted at doorways and with turns to change direction  FUNCTIONAL TESTS:  5 times sit to stand: 10.87 sec, arms crossed at chest, retropulsion  Timed up and go (TUG): 11.97 sec 10 meter walk test: 11.68 sec = 2.81 ft/sec TUG cognitive:  12.03 sec TUG manual:  11.78 sec  TODAY'S TREATMENT:                                                                                                                              DATE: 10/09/2022        GOALS: Goals reviewed with patient? Yes  SHORT TERM GOALS: Target date: 11/07/2022  Pt will be independent with HEP for improved balance, gait, functional strength. Baseline:  Not currently performing exercises Goal status: IN PROGRESS  2.  Pt will verbalize tips to reduce freezing/festination with gait, doorways, and turns.  Baseline:  Goal status: IN PROGRESS  3.  Pt will negotiate full flight of steps with handrail with step through pattern, to demonstrate improved functional strength. Baseline: reports half step-through and half step-to pattern Goal status:  IN PROGRESS  LONG TERM GOALS: Target date: 11/21/2022  Pt will be independent with HEP for improved strength, balance, gait. Baseline:  Goal status: IN PROGRESS  2.  Pt will improve MiniBESTest score to at least 24/28 to decrease fall risk. Baseline:  22/28 Goal status: IN PROGRESS  3.  Pt will perform 5x sit<>stand, 2 of 3 trials with no retropulsion, to demonstrate improved transfer efficiency and functional lower extremity strength. Baseline:  Goal status: IN PROGRESS  4.  Pt will report 50% improvement in sit<>stand from low surfaces in home. Baseline:  Goal status: IN PROGRESS  ASSESSMENT:  CLINICAL IMPRESSION: Skilled PT session focused on postural strengthening as well as lower extremity strengthening.  Pt demonstrates good understanding of cervical retraction from previous session and is integrating this motion into his initial sit<>stand, which pt reports is helping with initiation of gait.  Added scapular retraction and lower extremity strengthening to HEP.  He will continue to benefit from skilled PT towards goals for improved functional mobility and decreased fall risk.    OBJECTIVE IMPAIRMENTS: Abnormal gait, decreased balance, decreased knowledge of use of DME, decreased mobility, difficulty walking, decreased strength, impaired flexibility, and postural dysfunction.   ACTIVITY LIMITATIONS: standing, stairs, transfers, locomotion level, and caring for others  PARTICIPATION LIMITATIONS: community activity and hobbies  PERSONAL FACTORS: 3+ comorbidities: PMH  above  are also affecting patient's functional outcome.   REHAB POTENTIAL: Good  CLINICAL DECISION MAKING: Evolving/moderate complexity  EVALUATION COMPLEXITY: Moderate  PLAN:  PT FREQUENCY: 2x/week  PT DURATION: 6 weeks plus eval visit  PLANNED INTERVENTIONS: Therapeutic exercises, Therapeutic activity, Neuromuscular re-education, Balance training, Gait training, Patient/Family education, Self Care, Stair  training, and DME instructions  PLAN FOR NEXT SESSION: Review HEP and progress Functional strengthening:   step ups, SLS.  Marland Kitchen  Work on [place], SLS, posture, tips to reduce freezing with gait, doorways, turns   Mady Haagensen, PT 10/22/22 2:04 PM Phone: [phone] Fax: [place] at [facility], [place], [place] [address] Phone # [phone] Fax # [phone]

## 2022-10-27 ENCOUNTER — Ambulatory Visit: Payer: Medicare Other

## 2022-10-27 ENCOUNTER — Ambulatory Visit: Payer: Medicare Other | Attending: Neurology | Admitting: Physical Therapy

## 2022-10-27 ENCOUNTER — Encounter: Payer: Self-pay | Admitting: Physical Therapy

## 2022-10-27 DIAGNOSIS — R293 Abnormal posture: Secondary | ICD-10-CM | POA: Insufficient documentation

## 2022-10-27 DIAGNOSIS — R2689 Other abnormalities of gait and mobility: Secondary | ICD-10-CM | POA: Diagnosis not present

## 2022-10-27 DIAGNOSIS — R41841 Cognitive communication deficit: Secondary | ICD-10-CM | POA: Insufficient documentation

## 2022-10-27 DIAGNOSIS — R2681 Unsteadiness on feet: Secondary | ICD-10-CM | POA: Diagnosis not present

## 2022-10-27 DIAGNOSIS — R471 Dysarthria and anarthria: Secondary | ICD-10-CM | POA: Diagnosis not present

## 2022-10-27 DIAGNOSIS — R29818 Other symptoms and signs involving the nervous system: Secondary | ICD-10-CM | POA: Insufficient documentation

## 2022-10-27 NOTE — Therapy (Signed)
OUTPATIENT SPEECH LANGUAGE PATHOLOGY PARKINSON'S TREATMENT   Patient Name: [name] [name] MRN: [id number] DOB:[birth year], 72 y.o., male Today's Date: 10/27/2022  PCP: Dr. Deloris [name] REFERRING PROVIDER: Alonza Bogus, DO   End of Session - 10/27/22 1413     Visit Number 3    Number of Visits 17    Date for SLP Re-Evaluation 12/18/22    SLP Start Time 54    SLP Stop Time  2706    SLP Time Calculation (min) 40 min    Activity Tolerance Patient tolerated treatment well                Past Medical History:  Diagnosis Date   Cervical lymphadenopathy    right - followed by ENT   DOE (dyspnea on exertion) 07/15/2018   Dysphagia, neurologic 07/27/2014   Dystonia 1994   diagnosed in [place]   Enlarged lymph nodes 07/28/2007   Gastroesophageal reflux disease 03/07/2020   Localized swelling of right lower extremity 04/12/2019   Low back pain 11/21/2011   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   Mild neurocognitive disorder due to Parkinson's disease 01/10/2022   Non-Hodgkin lymphoma 2007   diffuse- 6 cycles of chemo with R CHOP Rituxan - last dose 10/08   Nonspecific elevation of levels of transaminase or lactic acid dehydrogenase (LDH) 07/06/2008   Parkinson's disease 07/27/2014   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   Past Surgical History:  Procedure Laterality Date   CHOLECYSTECTOMY, LAPAROSCOPIC     EYE SURGERY     x2 as child   PORT-A-CATH REMOVAL     PORTACATH PLACEMENT     SPLENECTOMY     TONSILLECTOMY     Patient Active Problem List   Diagnosis Date Noted   Mild neurocognitive disorder due to Parkinson's disease 01/10/2022   Gastroesophageal reflux disease 03/07/2020   Localized swelling of right lower extremity 04/12/2019   DOE (dyspnea on exertion) 07/15/2018   10 year risk of MI or stroke 7.5% or greater 04/12/2018   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  Rationale for Evaluation and Treatment Rehabilitation  SUBJECTIVE:   SUBJECTIVE STATEMENT: "I tihnk maybe my reflux was acting up last night. I have a sore throat and my mouth is dry." Pt accompanied by: self  PAIN:  Are you having pain? No  PATIENT GOALS: "I want to get back to where I was."  OBJECTIVE:   DIAGNOSTIC FINDINGS: From neuropsych note from April 2023: "[name] completed a comprehensive neuropsychological evaluation on 01/10/2022. Please refer to that encounter for the full report and recommendations. Briefly, results suggested isolated impaired performances across complex attention and semantic fluency. Performance variability was also exhibited across all aspects of learning and memory. Regarding etiology, the most likely culprit for ongoing cognitive weakness remains his past history of Parkinson's disease. While processing speed was improved relative to what might be expected, dysfunction surrounding complex attention and encoding/retrieval deficits of memory are quite common in this illness. The fact that motor/gait dysfunction was noted years prior to concern surrounding cognitive decline is also a typical timeline associated  with Parkinson's disease. Mild anxiety and sleep dysfunction could further influence cognitive performances. Research surrounding cognitive side effects of pramipexole is mixed; however, I cannot rule out an ongoing side effect contribution as well."   OBJECTIVE ASSESSMENT:  Measured when a sound level meter was placed 30 cm away from pt's masked mouth,  10 minutes of simple conversational speech was reduced today, at average low 60s dB (WNL= average 70-72dB) with range of 59-67dB. Overall speech intelligibility for this listener is 95-100%. Production of loud /a/ averaged low 90s dB (range of 86 to 95dB) and initial rare min cues needed for loudness.   Pt rated effort level at 8/10 for production of loud /a/ (10=maximal effort). In 2 minutes short conversation task pt was asked to use the same amount of effort as with loud /a/. Loudness average with this increased effort was mid-upper 60s dB (range of 60 to 71dB) with occasional min verbal cues for loudness. Pt would benefit from skilled ST in order to improve speech intelligibility and pt's QOL.  [name] [name]'s biggest concern at this time is his speech, as he tells SLP he is compensating well for attention and memory. However he does have concern about remembering people's names saying "I was never really good with names, but I have more trouble now."  Pt does not report difficulty with swallowing warranting further evaluation however SLP to monitor pt and inquire again about this during this therapy course.    PATIENT REPORTED OUTCOME MEASURES (PROM): Communication Effectiveness Survey: Pt scored 18/32 on this survey (pt scored 29/32 at discharge in August 2023).   Patient does not report any communication on the survey as 4 ("very effective"). He reports conversing with a familiar person over the telephone, being part of a conversation in a noisy environment, and speaking to a friend when he is emotionally upset as 3 ("mostly effective"). Scoring 2, or "Somewhat effective" was having a conversation with the family member or friends at home, participating in conversation with strangers in a quiet place, conversing with a stranger over the telephone, and having a conversation while traveling in a car. He rated 1 ("not at all effective") having a conversation with someone at a distance, like across the  room.  TODAY'S TREATMENT:  10/27/22: Loud /a/ measurements average mid 90s dB, everyday sentences were at mid-80s dB. In sentence tasks pt req'd occasional min-mod A for loudness decay faded to rare min -mod A for loudness decay. SLP encouraged pt to focus on his effort with loud /a/. In 2-3 minutes conversation segments pt req'd occasional mod A for maintaining loudness.  10/20/22: Loud /a/ measurements average low-mid 90s dB, everyday sentences were at mid low-80s dB. Sentence level responses today were average mid 60s dB with occasional min-mod cues for loudness, with longer responses. Conversational segments were produced with occasional mod cues for loudness decay.   10/09/22: SLP reviewed the rationale for BID completion of loud /a/ and everyday sentences. SLP ensured pt could obtain comparable loud /a/ measurements at d/c last course and he did so, with average low 90s dB. "I'm amazed I can still get up there that loud," pt told SLP. Pt stated everyday sentences at low 80s dB, comparable to previous course. Discussed goals and pt agreed.    PATIENT EDUCATION: Education details: see above in "today's treatment" Person educated: Patient Education method: Explanation, Demonstration, and Verbal cues Education comprehension: verbalized understanding and return demonstrated   HOME EXERCISE PROGRAM: Loud /a/ x6 BID, and everyday sentences.  GOALS: Goals reviewed with patient? Yes  SHORT TERM GOALS: Target date: 11/13/22    pt will produce loud /a/ with at least low 90s dB average over three sessions Baseline: 10/27/22 Goal status: Ongoing  2.  Pt will produce 16/20 sentence responses with 70dB over two sessions Baseline:  Goal status: Ongoing  3.  Pt will produce 5 minutes simple conversation with volume upper 60s dB average over three sessions Baseline:  Goal status: Ongoing  4.  pt will generate abdominal breathing 80% of the time when engaging in 5 minutes simple conversation in 2  sessions Baseline:  Goal status: Ongoing    LONG TERM GOALS: Target date: 12/18/22    Pt will score higher than 18/32 on CES in the final two ST sessions Baseline:  Goal status: Ongoing  2.  Pt will report successful usage of strategies for recall of people's names in 2 sessions Baseline:  Goal status: Ongoing  3.  Pt will maintain average of upper 60s dB over 10 minute simple-mod complex conversation with rare min A in three sessions  Baseline:  Goal status: Ongoing  4.  pt will use abdominal breathing 75% of the time in 10 minutes simple-mod complex conversation in three sessions  Baseline:  Goal status: Ongoing   ASSESSMENT:  CLINICAL IMPRESSION: Patient is a 72 y.o. male who was seen today for treatment of speech clarity who demonstrated mod dysarthria and reduced breath support due to Parkinson's disease. See tx note from today. Pt would cont to benefit from skilled ST targeting loudness in conversation, and strategies for recalling people's names.  OBJECTIVE IMPAIRMENTS  Objective impairments include memory and dysarthria. These impairments are limiting patient from managing medications, household responsibilities, and effectively communicating at home and in community.Factors affecting potential to achieve goals and functional outcome are previous level of function and severity of impairments.. Patient will benefit from skilled SLP services to address above impairments and improve overall function.  REHAB POTENTIAL: Good  PLAN: SLP FREQUENCY: 2x/week  SLP DURATION: 8 weeks  PLANNED INTERVENTIONS: Environmental controls, Cueing hierachy, Internal/external aids, Functional tasks, Multimodal communication approach, SLP instruction and feedback, and Compensatory strategies    Mischele Detter, CCC-SLP 10/27/2022, 2:14 PM

## 2022-10-27 NOTE — Therapy (Signed)
OUTPATIENT PHYSICAL THERAPY NEURO TREATMENT   Patient Name: [name] [name] MRN: [id number] DOB:12/05/1950, 72 y.o., male Today's Date: 10/09/2022   PCP: Riki Sheer, DO REFERRING PROVIDER: Alonza Bogus, DO  END OF SESSION:  PT End of Session - 10/27/22 1720     Visit Number 4    Number of Visits 13    Date for PT Re-Evaluation 11/21/22    Authorization Type Medicare/BCBS    Progress Note Due on Visit 10    PT Start Time 9292    PT Stop Time 1402    PT Time Calculation (min) 44 min    Activity Tolerance Patient tolerated treatment well    Behavior During Therapy [facility] for tasks assessed/performed              Past Medical History:  Diagnosis Date   Cervical lymphadenopathy    right - followed by ENT   DOE (dyspnea on exertion) 07/15/2018   Dysphagia, neurologic 07/27/2014   Dystonia 1994   diagnosed in [place]   Enlarged lymph nodes 07/28/2007   Gastroesophageal reflux disease 03/07/2020   Localized swelling of right lower extremity 04/12/2019   Low back pain 11/21/2011   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   Mild neurocognitive disorder due to Parkinson's disease 01/10/2022   Non-Hodgkin lymphoma 2007   diffuse- 6 cycles of chemo with R CHOP Rituxan - last dose 10/08   Nonspecific elevation of levels of transaminase or lactic acid dehydrogenase (LDH) 07/06/2008   Parkinson's disease 07/27/2014   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   Past Surgical History:  Procedure Laterality Date   CHOLECYSTECTOMY, LAPAROSCOPIC     EYE SURGERY     x2 as child   PORT-A-CATH REMOVAL     PORTACATH PLACEMENT     SPLENECTOMY     TONSILLECTOMY     Patient Active Problem List   Diagnosis Date Noted   Mild neurocognitive disorder due to Parkinson's disease 01/10/2022   Gastroesophageal reflux disease 03/07/2020   Localized swelling of right lower extremity 04/12/2019   DOE (dyspnea on exertion) 07/15/2018   10 year risk of MI or stroke 7.5%  or greater 04/12/2018   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   Dysphagia, neurologic 07/27/2014   Parkinson's disease 07/27/2014   Dystonia 07/10/2014   Low back pain 11/21/2011   Nonspecific elevation of levels of transaminase or lactic acid dehydrogenase (LDH) 07/06/2008   Enlarged lymph nodes 07/28/2007   Non-Hodgkin lymphoma 03/23/2007    ONSET DATE: 09/08/2022 (MD referral)  REFERRING DIAG: G20.A1 (ICD-10-CM) - Parkinson's disease without dyskinesia or fluctuating manifestations   THERAPY DIAG:  Abnormal posture  Unsteadiness on feet  Other abnormalities of gait and mobility  Rationale for Evaluation and Treatment: Rehabilitation  SUBJECTIVE:  SUBJECTIVE STATEMENT: Have some pain in low back.  Like my muscles are tired.   Pt accompanied by: self  PERTINENT HISTORY: See PMH above  PAIN:  Are you having pain? No and Yes: NPRS scale: 0-7 at worst/10 Pain location: low back Pain description: fatigue, tired Aggravating factors: standing, working around the house Relieving factors: lying down  PRECAUTIONS: Fall  WEIGHT BEARING RESTRICTIONS: No  FALLS: Has patient fallen in last 6 months? Yes. Number of falls 1  LIVING ENVIRONMENT: Lives with: lives with their family and lives with their spouse Lives in: House/apartment Stairs:  full flight to 2nd floor with bedroom and additional flight to 3rd floor man cave Has following equipment at home: Single point cane and Walker - 2 wheeled  PLOF: Independent  Not doing exercises and has not been attending [employer] Moves class  PATIENT GOALS: Getting more strength back  OBJECTIVE:    TODAY'S TREATMENT: 10/27/2022 Activity Comments  Reviewed HEP for posture exercises and step  ups Good review/return demo of HEP; cues for abdominal activation in standing  Stagger stance forward/back rocking x 10 reps, then with added arm swing x 10 reps Mild c/o back pain with lumbar hyperextension  Forward/back walking x 2 minutes   Marching forward x 2 minutes with turns Pt with decreased foot clearance/shuffling with turn  Focused on short distance walk and wide BOS weightshifting to turn, practiced x 6 reps Improves with Vcs and repetition      Access Code: [email or web address] URL: [email or web address] Date: 10/27/2022 Prepared by: [facility]  Exercises - Seated Cervical Retraction  - 1 x daily - 7 x weekly - 5-10 reps - 3 sec hold - Supine Passive Cervical Retraction  - 1 x daily - 7 x weekly - 10 reps - 3 sec hold - Sit to Stand Without Arm Support  - 1 x daily - 7 x weekly - 5 sets - 5 reps - Scapular Retraction with Resistance  - 1 x daily - 5 x weekly - 2-3 sets - 10 reps - Doorway Pec Stretch at 60 Elevation  - 1 x daily - 5 x weekly - 1 sets - 3 reps - 15 sec hold - Forward Step Up  - 1 x daily - 5 x weekly - 2 sets - 10 reps - Standing Weight Shift  - 1 x daily - 7 x weekly - 1-2 sets - 5 reps - Marching forward  - 1 x daily - 7 x weekly - 1 sets - 4 reps  PATIENT EDUCATION: Education details: technique for picking up objects from floor, HEP additions to HEP Person educated: Patient Education method: Consulting civil engineer, Demonstration, Verbal cues, and Handouts Education comprehension: verbalized understanding, returned demonstration, and needs further education   -------------------------------------------------------------- Objective measures below taken at initial evaluation:  DIAGNOSTIC FINDINGS: NA  COGNITION: Overall cognitive status: Within functional limits for tasks assessed.  Per neurocognitive testing in 2023:  [facility] *Lower voice volume noted in eval conversations*  SENSATION: NT  POSTURE: rounded shoulders,  forward head, and posterior pelvic tilt  Leans to right in unsupported sitting.  LOWER EXTREMITY ROM:   WFL A/ROM, seated position; P/ROM notes tightness bilateral hamstrings   LOWER EXTREMITY MMT:    MMT Right Eval Left Eval  Hip flexion 4 4+  Hip extension    Hip abduction    Hip adduction    Hip internal rotation    Hip external rotation    Knee flexion 4  4  Knee extension 4 4  Ankle dorsiflexion 3+ 3+  Ankle plantarflexion    Ankle inversion    Ankle eversion    (Blank rows = not tested)    TRANSFERS: Assistive device utilized: None  Sit to stand: CGA Stand to sit: CGA  STAIRS: Level of Assistance: Modified independence Stair Negotiation Technique: Alternating Pattern  with Bilateral Rails Number of Stairs: 3  Height of Stairs: 4"-6"  Comments: Pt does report that at home he has to change from step through to step-to pattern about half way up the steps, due to leg weakness.  GAIT: Gait pattern: step through pattern, shuffling, festinating, poor foot clearance- Right, and poor foot clearance- Left Distance walked: 50 ft x 4 Assistive device utilized: None Level of assistance: SBA Comments: Festinating noted at doorways and with turns to change direction  FUNCTIONAL TESTS:  5 times sit to stand: 10.87 sec, arms crossed at chest, retropulsion  Timed up and go (TUG): 11.97 sec 10 meter walk test: 11.68 sec = 2.81 ft/sec TUG cognitive:  12.03 sec TUG manual:  11.78 sec  TODAY'S TREATMENT:                                                                                                                              DATE: 10/09/2022        GOALS: Goals reviewed with patient? Yes  SHORT TERM GOALS: Target date: 11/07/2022  Pt will be independent with HEP for improved balance, gait, functional strength. Baseline:  Not currently performing exercises Goal status: IN PROGRESS  2.  Pt will verbalize tips to reduce freezing/festination with gait, doorways, and  turns.  Baseline:  Goal status: IN PROGRESS  3.  Pt will negotiate full flight of steps with handrail with step through pattern, to demonstrate improved functional strength. Baseline: reports half step-through and half step-to pattern Goal status: IN PROGRESS  LONG TERM GOALS: Target date: 11/21/2022  Pt will be independent with HEP for improved strength, balance, gait. Baseline:  Goal status: IN PROGRESS  2.  Pt will improve MiniBESTest score to at least 24/28 to decrease fall risk. Baseline:  22/28 Goal status: IN PROGRESS  3.  Pt will perform 5x sit<>stand, 2 of 3 trials with no retropulsion, to demonstrate improved transfer efficiency and functional lower extremity strength. Baseline:  Goal status: IN PROGRESS  4.  Pt will report 50% improvement in sit<>stand from low surfaces in home. Baseline:  Goal status: IN PROGRESS  ASSESSMENT:  CLINICAL IMPRESSION: Reviewed postural strengthening exercise and lower extremity strengthening exercises from HEP additions last visit.  Pt return demo understanding, but does report some back fatigue with certain movements.  Educated in posture, position for repetitive movements and abdominal activation for standing prolonged activities.  Pt is reporting he is able to make it up >1/2 way of stairs at home with step-through pattern prior to reverting to step-to pattern.  He also reports staggering his stance wider is  helpful to lessen being off balance and for improved ease of starting gait again.  He will continue to benefit from skilled PT to address functional strength, balance, weightshifting for improved functional mobility.    OBJECTIVE IMPAIRMENTS: Abnormal gait, decreased balance, decreased knowledge of use of DME, decreased mobility, difficulty walking, decreased strength, impaired flexibility, and postural dysfunction.   ACTIVITY LIMITATIONS: standing, stairs, transfers, locomotion level, and caring for others  PARTICIPATION LIMITATIONS:  community activity and hobbies  PERSONAL FACTORS: 3+ comorbidities: PMH above  are also affecting patient's functional outcome.   REHAB POTENTIAL: Good  CLINICAL DECISION MAKING: Evolving/moderate complexity  EVALUATION COMPLEXITY: Moderate  PLAN:  PT FREQUENCY: 2x/week  PT DURATION: 6 weeks plus eval visit  PLANNED INTERVENTIONS: Therapeutic exercises, Therapeutic activity, Neuromuscular re-education, Balance training, Gait training, Patient/Family education, Self Care, Stair training, and DME instructions  PLAN FOR NEXT SESSION: Continue to Review HEP and progress Functional strengthening:   step ups, SLS.  Work on [place], SLS, posture, tips to reduce freezing with gait, doorways, turns   Mady Haagensen, PT 10/27/22 5:20 PM Phone: [phone] Fax: [facility] at [facility] [address] [place], [place] [address] Phone # [phone] Fax # [phone]

## 2022-10-29 ENCOUNTER — Ambulatory Visit: Payer: Medicare Other

## 2022-10-29 ENCOUNTER — Encounter: Payer: Self-pay | Admitting: Physical Therapy

## 2022-10-29 ENCOUNTER — Ambulatory Visit: Payer: Medicare Other | Admitting: Physical Therapy

## 2022-10-29 DIAGNOSIS — R293 Abnormal posture: Secondary | ICD-10-CM | POA: Diagnosis not present

## 2022-10-29 DIAGNOSIS — R2689 Other abnormalities of gait and mobility: Secondary | ICD-10-CM

## 2022-10-29 DIAGNOSIS — R41841 Cognitive communication deficit: Secondary | ICD-10-CM | POA: Diagnosis not present

## 2022-10-29 DIAGNOSIS — R2681 Unsteadiness on feet: Secondary | ICD-10-CM | POA: Diagnosis not present

## 2022-10-29 DIAGNOSIS — R29818 Other symptoms and signs involving the nervous system: Secondary | ICD-10-CM | POA: Diagnosis not present

## 2022-10-29 DIAGNOSIS — R471 Dysarthria and anarthria: Secondary | ICD-10-CM | POA: Diagnosis not present

## 2022-10-29 NOTE — Therapy (Signed)
OUTPATIENT PHYSICAL THERAPY NEURO TREATMENT   Patient Name: [name] [name] MRN: [id number] DOB:Feb 10, 1951, 72 y.o., male Today's Date: 10/09/2022   PCP: Riki Sheer, DO REFERRING PROVIDER: Alonza Bogus, DO  END OF SESSION:  PT End of Session - 10/29/22 1405     Visit Number 5    Number of Visits 13    Date for PT Re-Evaluation 11/21/22    Authorization Type Medicare/BCBS    Progress Note Due on Visit 10    PT Start Time 4650    PT Stop Time 3546    PT Time Calculation (min) 41 min    Activity Tolerance Patient tolerated treatment well    Behavior During Therapy [facility] for tasks assessed/performed              Past Medical History:  Diagnosis Date   Cervical lymphadenopathy    right - followed by ENT   DOE (dyspnea on exertion) 07/15/2018   Dysphagia, neurologic 07/27/2014   Dystonia 1994   diagnosed in [place]   Enlarged lymph nodes 07/28/2007   Gastroesophageal reflux disease 03/07/2020   Localized swelling of right lower extremity 04/12/2019   Low back pain 11/21/2011   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   Mild neurocognitive disorder due to Parkinson's disease 01/10/2022   Non-Hodgkin lymphoma 2007   diffuse- 6 cycles of chemo with R CHOP Rituxan - last dose 10/08   Nonspecific elevation of levels of transaminase or lactic acid dehydrogenase (LDH) 07/06/2008   Parkinson's disease 07/27/2014   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   Past Surgical History:  Procedure Laterality Date   CHOLECYSTECTOMY, LAPAROSCOPIC     EYE SURGERY     x2 as child   PORT-A-CATH REMOVAL     PORTACATH PLACEMENT     SPLENECTOMY     TONSILLECTOMY     Patient Active Problem List   Diagnosis Date Noted   Mild neurocognitive disorder due to Parkinson's disease 01/10/2022   Gastroesophageal reflux disease 03/07/2020   Localized swelling of right lower extremity 04/12/2019   DOE (dyspnea on exertion) 07/15/2018   10 year risk of MI or stroke 7.5%  or greater 04/12/2018   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   Dysphagia, neurologic 07/27/2014   Parkinson's disease 07/27/2014   Dystonia 07/10/2014   Low back pain 11/21/2011   Nonspecific elevation of levels of transaminase or lactic acid dehydrogenase (LDH) 07/06/2008   Enlarged lymph nodes 07/28/2007   Non-Hodgkin lymphoma 03/23/2007    ONSET DATE: 09/08/2022 (MD referral)  REFERRING DIAG: G20.A1 (ICD-10-CM) - Parkinson's disease without dyskinesia or fluctuating manifestations   THERAPY DIAG:  Unsteadiness on feet  Other abnormalities of gait and mobility  Other symptoms and signs involving the nervous system  Abnormal posture  Rationale for Evaluation and Treatment: Rehabilitation  SUBJECTIVE:  SUBJECTIVE STATEMENT: Nothing new.  No pain. Pt accompanied by: self  PERTINENT HISTORY: See PMH above  PAIN:  Are you having pain?No pain today  PRECAUTIONS: Fall  WEIGHT BEARING RESTRICTIONS: No  FALLS: Has patient fallen in last 6 months? Yes. Number of falls 1  LIVING ENVIRONMENT: Lives with: lives with their family and lives with their spouse Lives in: House/apartment Stairs:  full flight to 2nd floor with bedroom and additional flight to 3rd floor man cave Has following equipment at home: Single point cane and Walker - 2 wheeled  PLOF: Independent  Not doing exercises and has not been attending [employer] Moves class  PATIENT GOALS: Getting more strength back  OBJECTIVE:    TODAY'S TREATMENT: 10/29/2022 Activity Comments  Reviewed HEP additions:  marching in place/marching forward/and weightshifting Needs min cues for reminder of technique  Short distance walk with turns-weightshifting turns and wider "hook" turns  Pt prefers hook turns for more stability, good foot clearance  Short distance gait with ball toss/catch, added cognitive task, focusing on turns Difficulty coordinating ball toss task, slowed pace and some decreased foot clearance with turns  Sit<>stand 3 sets of 10, holding weighted ball, 1st set with overhead reach Cues for slowed eccentric control  Squat to upright stand x 10 reps hold 6# weight   Forward step ups 2 x 10, 2# weights; sidestep R and L 2#, 2 x 10 reps   Farmer's carry, 6#, 25 ft x 8, with focus on turns 1 of 8 reps with good foot clearance with turns     PATIENT EDUCATION: Education details: turning techniques Person educated: Patient Education method: Explanation, Demonstration, and Verbal cues Education comprehension: verbalized understanding, returned demonstration, and needs further education  Access Code: [email or web address] URL: [email or web address] Date: 10/27/2022 Prepared by: [facility]  Exercises - Seated Cervical Retraction  - 1 x daily - 7 x weekly - 5-10 reps - 3 sec hold - Supine Passive Cervical Retraction  - 1 x daily - 7 x weekly - 10 reps - 3 sec hold - Sit to Stand Without Arm Support  - 1 x daily - 7 x weekly - 5 sets - 5 reps - Scapular Retraction with Resistance  - 1 x daily - 5 x weekly - 2-3 sets - 10 reps - Doorway Pec Stretch at 60 Elevation  - 1 x daily - 5 x weekly - 1 sets - 3 reps - 15 sec hold - Forward Step Up  - 1 x daily - 5 x weekly - 2 sets - 10 reps - Standing Weight Shift  - 1 x daily - 7 x weekly - 1-2 sets - 5 reps - Marching forward  - 1 x daily - 7 x weekly - 1 sets - 4 reps    -------------------------------------------------------------- Objective measures below taken at initial evaluation:  DIAGNOSTIC FINDINGS: NA  COGNITION: Overall cognitive status: Within functional limits for tasks assessed.  Per neurocognitive testing in 2023:  [facility] *Lower voice volume noted in eval  conversations*  SENSATION: NT  POSTURE: rounded shoulders, forward head, and posterior pelvic tilt  Leans to right in unsupported sitting.  LOWER EXTREMITY ROM:   WFL A/ROM, seated position; P/ROM notes tightness bilateral hamstrings   LOWER EXTREMITY MMT:    MMT Right Eval Left Eval  Hip flexion 4 4+  Hip extension    Hip abduction    Hip adduction    Hip internal rotation    Hip external  rotation    Knee flexion 4 4  Knee extension 4 4  Ankle dorsiflexion 3+ 3+  Ankle plantarflexion    Ankle inversion    Ankle eversion    (Blank rows = not tested)    TRANSFERS: Assistive device utilized: None  Sit to stand: CGA Stand to sit: CGA  STAIRS: Level of Assistance: Modified independence Stair Negotiation Technique: Alternating Pattern  with Bilateral Rails Number of Stairs: 3  Height of Stairs: 4"-6"  Comments: Pt does report that at home he has to change from step through to step-to pattern about half way up the steps, due to leg weakness.  GAIT: Gait pattern: step through pattern, shuffling, festinating, poor foot clearance- Right, and poor foot clearance- Left Distance walked: 50 ft x 4 Assistive device utilized: None Level of assistance: SBA Comments: Festinating noted at doorways and with turns to change direction  FUNCTIONAL TESTS:  5 times sit to stand: 10.87 sec, arms crossed at chest, retropulsion  Timed up and go (TUG): 11.97 sec 10 meter walk test: 11.68 sec = 2.81 ft/sec TUG cognitive:  12.03 sec TUG manual:  11.78 sec  TODAY'S TREATMENT:                                                                                                                              DATE: 10/09/2022        GOALS: Goals reviewed with patient? Yes  SHORT TERM GOALS: Target date: 11/07/2022  Pt will be independent with HEP for improved balance, gait, functional strength. Baseline:  Not currently performing exercises Goal status: IN PROGRESS  2.  Pt will verbalize  tips to reduce freezing/festination with gait, doorways, and turns.  Baseline:  Goal status: IN PROGRESS  3.  Pt will negotiate full flight of steps with handrail with step through pattern, to demonstrate improved functional strength. Baseline: reports half step-through and half step-to pattern Goal status: IN PROGRESS  LONG TERM GOALS: Target date: 11/21/2022  Pt will be independent with HEP for improved strength, balance, gait. Baseline:  Goal status: IN PROGRESS  2.  Pt will improve MiniBESTest score to at least 24/28 to decrease fall risk. Baseline:  22/28 Goal status: IN PROGRESS  3.  Pt will perform 5x sit<>stand, 2 of 3 trials with no retropulsion, to demonstrate improved transfer efficiency and functional lower extremity strength. Baseline:  Goal status: IN PROGRESS  4.  Pt will report 50% improvement in sit<>stand from low surfaces in home. Baseline:  Goal status: IN PROGRESS  ASSESSMENT:  CLINICAL IMPRESSION: Skilled PT session today focused on dynamic gait and turning with added cognitive and manual tasks.  With additional tasks on top of gait, pt tends to have decreased foot clearance with turns.  He is at end of session able to make good turns with adequate foot clearance with farmer's carry technique, 7 of 8 trials.  He will continue to benefit from skilled PT towards improved strength, balance, gait  for decreased festination episodes and decreased fall risk.    OBJECTIVE IMPAIRMENTS: Abnormal gait, decreased balance, decreased knowledge of use of DME, decreased mobility, difficulty walking, decreased strength, impaired flexibility, and postural dysfunction.   ACTIVITY LIMITATIONS: standing, stairs, transfers, locomotion level, and caring for others  PARTICIPATION LIMITATIONS: community activity and hobbies  PERSONAL FACTORS: 3+ comorbidities: PMH above  are also affecting patient's functional outcome.   REHAB POTENTIAL: Good  CLINICAL DECISION MAKING:  Evolving/moderate complexity  EVALUATION COMPLEXITY: Moderate  PLAN:  PT FREQUENCY: 2x/week  PT DURATION: 6 weeks plus eval visit  PLANNED INTERVENTIONS: Therapeutic exercises, Therapeutic activity, Neuromuscular re-education, Balance training, Gait training, Patient/Family education, Self Care, Stair training, and DME instructions  PLAN FOR NEXT SESSION: Continue to Review HEP and progress-consider farmer's carry or squats with weight.  Functional strengthening:   step ups, SLS.  Work on [place], SLS, posture, tips to reduce freezing with gait, doorways, turns   Mady Haagensen, PT 10/29/22 4:29 PM Phone: [phone] Fax: [place] at [facility] [place], [place], [place] [address] Phone # [phone] Fax # [phone]

## 2022-10-29 NOTE — Therapy (Signed)
OUTPATIENT SPEECH LANGUAGE PATHOLOGY [facility]   Patient Name: [name] [name] MRN: [id number] DOB:03/11/1951, 72 y.o., male Today's Date: 10/29/2022  PCP: Dr. Deloris Ping REFERRING PROVIDER: Alonza Bogus, DO   End of Session - 10/29/22 1405     Visit Number 4    Number of Visits 17    Date for SLP Re-Evaluation 12/18/22    SLP Start Time 41    SLP Stop Time  1402    SLP Time Calculation (min) 43 min    Activity Tolerance Patient tolerated treatment well                 Past Medical History:  Diagnosis Date   Cervical lymphadenopathy    right - followed by ENT   DOE (dyspnea on exertion) 07/15/2018   Dysphagia, neurologic 07/27/2014   Dystonia 1994   diagnosed in [place]   Enlarged lymph nodes 07/28/2007   Gastroesophageal reflux disease 03/07/2020   Localized swelling of right lower extremity 04/12/2019   Low back pain 11/21/2011   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   Mild neurocognitive disorder due to Parkinson's disease 01/10/2022   Non-Hodgkin lymphoma 2007   diffuse- 6 cycles of chemo with R CHOP Rituxan - last dose 10/08   Nonspecific elevation of levels of transaminase or lactic acid dehydrogenase (LDH) 07/06/2008   Parkinson's disease 07/27/2014   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   Past Surgical History:  Procedure Laterality Date   CHOLECYSTECTOMY, LAPAROSCOPIC     EYE SURGERY     x2 as child   PORT-A-CATH REMOVAL     PORTACATH PLACEMENT     SPLENECTOMY     TONSILLECTOMY     Patient Active Problem List   Diagnosis Date Noted   Mild neurocognitive disorder due to Parkinson's disease 01/10/2022   Gastroesophageal reflux disease 03/07/2020   Localized swelling of right lower extremity 04/12/2019   DOE (dyspnea on exertion) 07/15/2018   10 year risk of MI or stroke 7.5% or greater 04/12/2018   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    Dysphagia, neurologic 07/27/2014   Parkinson's disease 07/27/2014   Dystonia 07/10/2014   Low back pain 11/21/2011   Nonspecific elevation of levels of transaminase or lactic acid dehydrogenase (LDH) 07/06/2008   Enlarged lymph nodes 07/28/2007   Non-Hodgkin lymphoma 03/23/2007    ONSET DATE: Dx in mid-2010's  REFERRING DIAG: G20 - Parkinson's disease   THERAPY DIAG:  Dysarthria and anarthria  Cognitive communication deficit  Rationale for Evaluation and Treatment Rehabilitation  SUBJECTIVE:   SUBJECTIVE STATEMENT: "I got a contact on the way over here from [place]." Pt accompanied by: self  PAIN:  Are you having pain? No  PATIENT GOALS: "I want to get back to where I was."  OBJECTIVE:   DIAGNOSTIC FINDINGS: From neuropsych note from April 2023: "Mr. Mechling completed a comprehensive neuropsychological evaluation on 01/10/2022. Please refer to that encounter for the full report and recommendations. Briefly, results suggested isolated impaired performances across complex attention and semantic fluency. Performance variability was also exhibited across all aspects of learning and memory. Regarding etiology, the most likely culprit for ongoing cognitive weakness remains his past history of Parkinson's disease. While processing speed was improved relative to what might be expected, dysfunction surrounding complex attention and encoding/retrieval deficits of memory are quite common in this illness. The fact that motor/gait dysfunction was noted years prior to concern surrounding cognitive decline is also a typical timeline associated with Parkinson's disease. Mild  anxiety and sleep dysfunction could further influence cognitive performances. Research surrounding cognitive side effects of pramipexole is mixed; however, I cannot rule out an ongoing side effect contribution as well."   OBJECTIVE ASSESSMENT:  Measured when a sound level meter was placed 30 cm away from pt's masked mouth,  10 minutes of simple conversational speech was reduced today, at average low 60s dB (WNL= average 70-72dB) with range of 59-67dB. Overall speech intelligibility for this listener is 95-100%. Production of loud /a/ averaged low 90s dB (range of 86 to 95dB) and initial rare min cues needed for loudness.   Pt rated effort level at 8/10 for production of loud /a/ (10=maximal effort). In 2 minutes short conversation task pt was asked to use the same amount of effort as with loud /a/. Loudness average with this increased effort was mid-upper 60s dB (range of 60 to 71dB) with occasional min verbal cues for loudness. Pt would benefit from skilled ST in order to improve speech intelligibility and pt's QOL.  [name]'s biggest concern at this time is his speech, as he tells SLP he is compensating well for attention and memory. However he does have concern about remembering people's names saying "I was never really good with names, but I have more trouble now."  Pt does not report difficulty with swallowing warranting further evaluation however SLP to monitor pt and inquire again about this during this therapy course.    PATIENT REPORTED OUTCOME MEASURES (PROM): Communication Effectiveness Survey: Pt scored 18/32 on this survey (pt scored 29/32 at discharge in August 2023).   Patient does not report any communication on the survey as 4 ("very effective"). He reports conversing with a familiar person over the telephone, being part of a conversation in a noisy environment, and speaking to a friend when he is emotionally upset as 3 ("mostly effective"). Scoring 2, or "Somewhat effective" was having a conversation with the family member or friends at home, participating in conversation with strangers in a quiet place, conversing with a stranger over the telephone, and having a conversation while traveling in a car. He rated 1 ("not at all effective") having a conversation with someone at a distance, like across the  room.  TODAY'S TREATMENT:  10/29/22: Loud /a/ measurements average mid 90s dB, everyday sentences were measured at average mid-80s dB. In sentence tasks pt req'd rare min A for loudness decay. Pt and SLP talked in mod complex conversation with pt having upper 60s dB for 12 minutes.  10/27/22: Loud /a/ measurements average mid 90s dB, everyday sentences were at mid-80s dB. In sentence tasks pt req'd occasional min-mod A for loudness decay faded to rare min -mod A for loudness decay. SLP encouraged pt to focus on his effort with loud /a/. In 2-3 minutes conversation segments pt req'd occasional mod A for maintaining loudness.  10/20/22: Loud /a/ measurements average low-mid 90s dB, everyday sentences were at mid low-80s dB. Sentence level responses today were average mid 60s dB with occasional min-mod cues for loudness, with longer responses. Conversational segments were produced with occasional mod cues for loudness decay.   10/09/22: SLP reviewed the rationale for BID completion of loud /a/ and everyday sentences. SLP ensured pt could obtain comparable loud /a/ measurements at d/c last course and he did so, with average low 90s dB. "I'm amazed I can still get up there that loud," pt told SLP. Pt stated everyday sentences at low 80s dB, comparable to previous course. Discussed goals and pt agreed.  PATIENT EDUCATION: Education details: see above in "today's treatment" Person educated: Patient Education method: Explanation, Demonstration, and Verbal cues Education comprehension: verbalized understanding and return demonstrated   HOME EXERCISE PROGRAM: Loud /a/ x6 BID, and everyday sentences.   GOALS: Goals reviewed with patient? Yes  SHORT TERM GOALS: Target date: 11/13/22    pt will produce loud /a/ with at least low 90s dB average over three sessions Baseline: 10/27/22, 10/29/22 Goal status: Ongoing  2.  Pt will produce 16/20 sentence responses with 70dB over two sessions Baseline:  10/29/22 Goal status: Ongoing  3.  Pt will produce 5 minutes simple conversation with volume upper 60s dB average over three sessions Baseline:  Goal status: Ongoing  4.  pt will generate abdominal breathing 80% of the time when engaging in 5 minutes simple conversation in 2 sessions Baseline:  Goal status: Ongoing    LONG TERM GOALS: Target date: 12/18/22    Pt will score higher than 18/32 on CES in the final two ST sessions Baseline:  Goal status: Ongoing  2.  Pt will report successful usage of strategies for recall of people's names in 2 sessions Baseline:  Goal status: Ongoing  3.  Pt will maintain average of upper 60s dB over 10 minute simple-mod complex conversation with rare min A in three sessions  Baseline:  Goal status: Ongoing  4.  pt will use abdominal breathing 75% of the time in 10 minutes simple-mod complex conversation in three sessions  Baseline:  Goal status: Ongoing   ASSESSMENT:  CLINICAL IMPRESSION: Patient is a 72 y.o. male who was seen today for treatment of speech clarity who demonstrated mod dysarthria and reduced breath support due to Parkinson's disease. See tx note from today. Pt would cont to benefit from skilled ST targeting loudness in conversation, and strategies for recalling people's names.  OBJECTIVE IMPAIRMENTS  Objective impairments include memory and dysarthria. These impairments are limiting patient from managing medications, household responsibilities, and effectively communicating at home and in community.Factors affecting potential to achieve goals and functional outcome are previous level of function and severity of impairments.. Patient will benefit from skilled SLP services to address above impairments and improve overall function.  REHAB POTENTIAL: Good  PLAN: SLP FREQUENCY: 2x/week  SLP DURATION: 8 weeks  PLANNED INTERVENTIONS: Environmental controls, Cueing hierachy, Internal/external aids, Functional tasks, Multimodal  communication approach, SLP instruction and feedback, and Compensatory strategies    Herny Scurlock, CCC-SLP 10/29/2022, 2:05 PM

## 2022-11-03 ENCOUNTER — Ambulatory Visit: Payer: Medicare Other

## 2022-11-03 ENCOUNTER — Ambulatory Visit: Payer: Medicare Other | Admitting: Physical Therapy

## 2022-11-03 DIAGNOSIS — R2689 Other abnormalities of gait and mobility: Secondary | ICD-10-CM | POA: Diagnosis not present

## 2022-11-03 DIAGNOSIS — R2681 Unsteadiness on feet: Secondary | ICD-10-CM | POA: Diagnosis not present

## 2022-11-03 DIAGNOSIS — R471 Dysarthria and anarthria: Secondary | ICD-10-CM

## 2022-11-03 DIAGNOSIS — R41841 Cognitive communication deficit: Secondary | ICD-10-CM | POA: Diagnosis not present

## 2022-11-03 DIAGNOSIS — R29818 Other symptoms and signs involving the nervous system: Secondary | ICD-10-CM | POA: Diagnosis not present

## 2022-11-03 DIAGNOSIS — R293 Abnormal posture: Secondary | ICD-10-CM | POA: Diagnosis not present

## 2022-11-03 NOTE — Therapy (Signed)
OUTPATIENT SPEECH LANGUAGE PATHOLOGY [facility]   Patient Name: [name] [name] MRN: [id number] DOB:07-21-1951, 72 y.o., male Today's Date: 11/03/2022  PCP: Dr. Deloris Ping REFERRING PROVIDER: Tat, Wells Guiles, DO   End of Session - 11/03/22 1547     Visit Number 5    Number of Visits 17    Date for SLP Re-Evaluation 12/18/22    SLP Start Time 74   late   SLP Stop Time  49    SLP Time Calculation (min) 38 min    Activity Tolerance Patient tolerated treatment well                 Past Medical History:  Diagnosis Date   Cervical lymphadenopathy    right - followed by ENT   DOE (dyspnea on exertion) 07/15/2018   Dysphagia, neurologic 07/27/2014   Dystonia 1994   diagnosed in [place]   Enlarged lymph nodes 07/28/2007   Gastroesophageal reflux disease 03/07/2020   Localized swelling of right lower extremity 04/12/2019   Low back pain 11/21/2011   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   Mild neurocognitive disorder due to Parkinson's disease 01/10/2022   Non-Hodgkin lymphoma 2007   diffuse- 6 cycles of chemo with R CHOP Rituxan - last dose 10/08   Nonspecific elevation of levels of transaminase or lactic acid dehydrogenase (LDH) 07/06/2008   Parkinson's disease 07/27/2014   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   Past Surgical History:  Procedure Laterality Date   CHOLECYSTECTOMY, LAPAROSCOPIC     EYE SURGERY     x2 as child   PORT-A-CATH REMOVAL     PORTACATH PLACEMENT     SPLENECTOMY     TONSILLECTOMY     Patient Active Problem List   Diagnosis Date Noted   Mild neurocognitive disorder due to Parkinson's disease 01/10/2022   Gastroesophageal reflux disease 03/07/2020   Localized swelling of right lower extremity 04/12/2019   DOE (dyspnea on exertion) 07/15/2018   10 year risk of MI or stroke 7.5% or greater 04/12/2018   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    Dysphagia, neurologic 07/27/2014   Parkinson's disease 07/27/2014   Dystonia 07/10/2014   Low back pain 11/21/2011   Nonspecific elevation of levels of transaminase or lactic acid dehydrogenase (LDH) 07/06/2008   Enlarged lymph nodes 07/28/2007   Non-Hodgkin lymphoma 03/23/2007    ONSET DATE: Dx in mid-2010's  REFERRING DIAG: G20 - Parkinson's disease   THERAPY DIAG:  Dysarthria and anarthria  Cognitive communication deficit  Rationale for Evaluation and Treatment Rehabilitation  SUBJECTIVE:   SUBJECTIVE STATEMENT: "I got a contact on the way over here from [place]." Pt accompanied by: self  PAIN:  Are you having pain? Yes: NPRS scale: 1/10 Pain location: lower back Pain description: soreness  PATIENT GOALS: "I want to get back to where I was."  OBJECTIVE:   DIAGNOSTIC FINDINGS: From neuropsych note from April 2023: "[name] completed a comprehensive neuropsychological evaluation on 01/10/2022. Please refer to that encounter for the full report and recommendations. Briefly, results suggested isolated impaired performances across complex attention and semantic fluency. Performance variability was also exhibited across all aspects of learning and memory. Regarding etiology, the most likely culprit for ongoing cognitive weakness remains his past history of Parkinson's disease. While processing speed was improved relative to what might be expected, dysfunction surrounding complex attention and encoding/retrieval deficits of memory are quite common in this illness. The fact that motor/gait dysfunction was noted years prior to concern surrounding  cognitive decline is also a typical timeline associated with Parkinson's disease. Mild anxiety and sleep dysfunction could further influence cognitive performances. Research surrounding cognitive side effects of pramipexole is mixed; however, I cannot rule out an ongoing side effect contribution as well."   OBJECTIVE  ASSESSMENT:  Measured when a sound level meter was placed 30 cm away from pt's masked mouth, 10 minutes of simple conversational speech was reduced today, at average low 60s dB (WNL= average 70-72dB) with range of 59-67dB. Overall speech intelligibility for this listener is 95-100%. Production of loud /a/ averaged low 90s dB (range of 86 to 95dB) and initial rare min cues needed for loudness.   Pt rated effort level at 8/10 for production of loud /a/ (10=maximal effort). In 2 minutes short conversation task pt was asked to use the same amount of effort as with loud /a/. Loudness average with this increased effort was mid-upper 60s dB (range of 60 to 71dB) with occasional min verbal cues for loudness. Pt would benefit from skilled ST in order to improve speech intelligibility and pt's QOL.  [name]'s biggest concern at this time is his speech, as he tells SLP he is compensating well for attention and memory. However he does have concern about remembering people's names saying "I was never really good with names, but I have more trouble now."  Pt does not report difficulty with swallowing warranting further evaluation however SLP to monitor pt and inquire again about this during this therapy course.    PATIENT REPORTED OUTCOME MEASURES (PROM): Communication Effectiveness Survey: Pt scored 18/32 on this survey (pt scored 29/32 at discharge in August 2023).   Patient does not report any communication on the survey as 4 ("very effective"). He reports conversing with a familiar person over the telephone, being part of a conversation in a noisy environment, and speaking to a friend when he is emotionally upset as 3 ("mostly effective"). Scoring 2, or "Somewhat effective" was having a conversation with the family member or friends at home, participating in conversation with strangers in a quiet place, conversing with a stranger over the telephone, and having a conversation while traveling in a car. He rated 1  ("not at all effective") having a conversation with someone at a distance, like across the room.  TODAY'S TREATMENT:  11/03/22: Loud /a/ measurements average low-mid 90s dB, everyday sentences were measured at average low-mid 80s dB. Pt and SLP talked in 3 minute conversational segments in mod complex conversation with pt having upper 60s dB.  10/29/22: Loud /a/ measurements average mid 90s dB, everyday sentences were measured at average mid-80s dB. In sentence tasks pt req'd rare min A for loudness decay. Pt and SLP talked in mod complex conversation with pt having upper 60s dB for 12 minutes.  10/27/22: Loud /a/ measurements average mid 90s dB, everyday sentences were at mid-80s dB. In sentence tasks pt req'd occasional min-mod A for loudness decay faded to rare min -mod A for loudness decay. SLP encouraged pt to focus on his effort with loud /a/. In 2-3 minutes conversation segments pt req'd occasional mod A for maintaining loudness.  10/20/22: Loud /a/ measurements average low-mid 90s dB, everyday sentences were at mid low-80s dB. Sentence level responses today were average mid 60s dB with occasional min-mod cues for loudness, with longer responses. Conversational segments were produced with occasional mod cues for loudness decay.   10/09/22: SLP reviewed the rationale for BID completion of loud /a/ and everyday sentences. SLP ensured pt could obtain  comparable loud /a/ measurements at d/c last course and he did so, with average low 90s dB. "I'm amazed I can still get up there that loud," pt told SLP. Pt stated everyday sentences at low 80s dB, comparable to previous course. Discussed goals and pt agreed.    PATIENT EDUCATION: Education details: see above in "today's treatment" Person educated: Patient Education method: Explanation, Demonstration, and Verbal cues Education comprehension: verbalized understanding and return demonstrated   HOME EXERCISE PROGRAM: Loud /a/ x6 BID, and everyday  sentences.   GOALS: Goals reviewed with patient? Yes  SHORT TERM GOALS: Target date: 11/13/22    pt will produce loud /a/ with at least low 90s dB average over three sessions Baseline: 10/27/22, 10/29/22 Goal status: Met  2.  Pt will produce 16/20 sentence responses with 70dB over two sessions Baseline: 10/29/22 Goal status: Met  3.  Pt will produce 5 minutes simple conversation with volume upper 60s dB average over three sessions Baseline:  Goal status: Ongoing  4.  pt will generate abdominal breathing 80% of the time when engaging in 5 minutes simple conversation in 2 sessions Baseline:  Goal status: Ongoing    LONG TERM GOALS: Target date: 12/18/22    Pt will score higher than 18/32 on CES in the final two ST sessions Baseline:  Goal status: Ongoing  2.  Pt will report successful usage of strategies for recall of people's names in 2 sessions Baseline:  Goal status: Ongoing  3.  Pt will maintain average of upper 60s dB over 10 minute simple-mod complex conversation with rare min A in three sessions  Baseline:  Goal status: Ongoing  4.  pt will use abdominal breathing 75% of the time in 10 minutes simple-mod complex conversation in three sessions  Baseline:  Goal status: Ongoing   ASSESSMENT:  CLINICAL IMPRESSION: Patient is a 72 y.o. male who was seen today for treatment of speech clarity who demonstrated mod dysarthria and reduced breath support due to Parkinson's disease. See tx note from today. Pt would cont to benefit from skilled ST targeting loudness in conversation, and strategies for recalling people's names.  OBJECTIVE IMPAIRMENTS  Objective impairments include memory and dysarthria. These impairments are limiting patient from managing medications, household responsibilities, and effectively communicating at home and in community.Factors affecting potential to achieve goals and functional outcome are previous level of function and severity of impairments..  Patient will benefit from skilled SLP services to address above impairments and improve overall function.  REHAB POTENTIAL: Good  PLAN: SLP FREQUENCY: 2x/week  SLP DURATION: 8 weeks  PLANNED INTERVENTIONS: Environmental controls, Cueing hierachy, Internal/external aids, Functional tasks, Multimodal communication approach, SLP instruction and feedback, and Compensatory strategies    Tykira Wachs, CCC-SLP 11/03/2022, 3:48 PM

## 2022-11-05 NOTE — Therapy (Unsigned)
OUTPATIENT SPEECH LANGUAGE PATHOLOGY PARKINSON'S TREATMENT   Patient Name: [name] [name] MRN: [id number] DOB:09-Jun-1951, 72 y.o., male Today's Date: 11/06/2022  PCP: Dr. Deloris Ping REFERRING PROVIDER: Alonza Bogus, DO   End of Session - 11/06/22 1147     Visit Number 6    Number of Visits 17    Date for SLP Re-Evaluation 12/18/22    SLP Start Time 1147    SLP Stop Time  58    SLP Time Calculation (min) 43 min    Activity Tolerance Patient tolerated treatment well                  Past Medical History:  Diagnosis Date   Cervical lymphadenopathy    right - followed by ENT   DOE (dyspnea on exertion) 07/15/2018   Dysphagia, neurologic 07/27/2014   Dystonia 1994   diagnosed in [place]   Enlarged lymph nodes 07/28/2007   Gastroesophageal reflux disease 03/07/2020   Localized swelling of right lower extremity 04/12/2019   Low back pain 11/21/2011   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   Mild neurocognitive disorder due to Parkinson's disease 01/10/2022   Non-Hodgkin lymphoma 2007   diffuse- 6 cycles of chemo with R CHOP Rituxan - last dose 10/08   Nonspecific elevation of levels of transaminase or lactic acid dehydrogenase (LDH) 07/06/2008   Parkinson's disease 07/27/2014   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   Past Surgical History:  Procedure Laterality Date   CHOLECYSTECTOMY, LAPAROSCOPIC     EYE SURGERY     x2 as child   PORT-A-CATH REMOVAL     PORTACATH PLACEMENT     SPLENECTOMY     TONSILLECTOMY     Patient Active Problem List   Diagnosis Date Noted   Mild neurocognitive disorder due to Parkinson's disease 01/10/2022   Gastroesophageal reflux disease 03/07/2020   Localized swelling of right lower extremity 04/12/2019   DOE (dyspnea on exertion) 07/15/2018   10 year risk of MI or stroke 7.5% or greater 04/12/2018   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    Dysphagia, neurologic 07/27/2014   Parkinson's disease 07/27/2014   Dystonia 07/10/2014   Low back pain 11/21/2011   Nonspecific elevation of levels of transaminase or lactic acid dehydrogenase (LDH) 07/06/2008   Enlarged lymph nodes 07/28/2007   Non-Hodgkin lymphoma 03/23/2007    ONSET DATE: Dx in mid-2010's  REFERRING DIAG: G20 - Parkinson's disease   THERAPY DIAG:  Dysarthria and anarthria  Cognitive communication deficit  Rationale for Evaluation and Treatment Rehabilitation  SUBJECTIVE:   SUBJECTIVE STATEMENT: "It's getting better"  Pt accompanied by: self  PAIN:  Are you having pain? Yes: NPRS scale: 1/10 Pain location: lower back Pain description: soreness  PATIENT GOALS: "I want to get back to where I was."  OBJECTIVE:   DIAGNOSTIC FINDINGS: From neuropsych note from April 2023: "[name] [name] completed a comprehensive neuropsychological evaluation on 01/10/2022. Please refer to that encounter for the full report and recommendations. Briefly, results suggested isolated impaired performances across complex attention and semantic fluency. Performance variability was also exhibited across all aspects of learning and memory. Regarding etiology, the most likely culprit for ongoing cognitive weakness remains his past history of Parkinson's disease. While processing speed was improved relative to what might be expected, dysfunction surrounding complex attention and encoding/retrieval deficits of memory are quite common in this illness. The fact that motor/gait dysfunction was noted years prior to concern surrounding cognitive decline is also a typical timeline associated  with Parkinson's disease. Mild anxiety and sleep dysfunction could further influence cognitive performances. Research surrounding cognitive side effects of pramipexole is mixed; however, I cannot rule out an ongoing side effect contribution as well."   OBJECTIVE ASSESSMENT:  Measured when a sound level meter was  placed 30 cm away from pt's masked mouth, 10 minutes of simple conversational speech was reduced today, at average low 60s dB (WNL= average 70-72dB) with range of 59-67dB. Overall speech intelligibility for this listener is 95-100%. Production of loud /a/ averaged low 90s dB (range of 86 to 95dB) and initial rare min cues needed for loudness.   Pt rated effort level at 8/10 for production of loud /a/ (10=maximal effort). In 2 minutes short conversation task pt was asked to use the same amount of effort as with loud /a/. Loudness average with this increased effort was mid-upper 60s dB (range of 60 to 71dB) with occasional min verbal cues for loudness. Pt would benefit from skilled ST in order to improve speech intelligibility and pt's QOL.  [name] [name]'s biggest concern at this time is his speech, as he tells SLP he is compensating well for attention and memory. However he does have concern about remembering people's names saying "I was never really good with names, but I have more trouble now."  Pt does not report difficulty with swallowing warranting further evaluation however SLP to monitor pt and inquire again about this during this therapy course.    PATIENT REPORTED OUTCOME MEASURES (PROM): Communication Effectiveness Survey: Pt scored 18/32 on this survey (pt scored 29/32 at discharge in August 2023).   Patient does not report any communication on the survey as 4 ("very effective"). He reports conversing with a familiar person over the telephone, being part of a conversation in a noisy environment, and speaking to a friend when he is emotionally upset as 3 ("mostly effective"). Scoring 2, or "Somewhat effective" was having a conversation with the family member or friends at home, participating in conversation with strangers in a quiet place, conversing with a stranger over the telephone, and having a conversation while traveling in a car. He rated 1 ("not at all effective") having a conversation with  someone at a distance, like across the room.  TODAY'S TREATMENT:  11/16/22: Initial conversation averaged 66 dB (faded upper to low 60s dB), with SLP requesting intermittent repetition. Loud /a/ completed to optimize vocal intensity with measurements averaging 90 dB. Everyday sentences were measured at average 69 dB. Pt and SLP talked in 3-5 minute conversational segments in mod complex conversation with pt averaging 65 dB (mid fading to low 60s dB). Emotional topics inadvertently discussed which further impacted carryover of trained strategies requiring usual cues to optimize volume. Of note, intermittent to usual throat clears exhibited with pt reporting throat tension. Focused on abdominal breathing intermittently to reduce throat clear and aid breath support.   11/03/22: Loud /a/ measurements average low-mid 90s dB, everyday sentences were measured at average low-mid 80s dB. Pt and SLP talked in 3 minute conversational segments in mod complex conversation with pt having upper 60s dB.  10/29/22: Loud /a/ measurements average mid 90s dB, everyday sentences were measured at average mid-80s dB. In sentence tasks pt req'd rare min A for loudness decay. Pt and SLP talked in mod complex conversation with pt having upper 60s dB for 12 minutes.  10/27/22: Loud /a/ measurements average mid 90s dB, everyday sentences were at mid-80s dB. In sentence tasks pt req'd occasional min-mod A for loudness decay faded  to rare min -mod A for loudness decay. SLP encouraged pt to focus on his effort with loud /a/. In 2-3 minutes conversation segments pt req'd occasional mod A for maintaining loudness.  10/20/22: Loud /a/ measurements average low-mid 90s dB, everyday sentences were at mid low-80s dB. Sentence level responses today were average mid 60s dB with occasional min-mod cues for loudness, with longer responses. Conversational segments were produced with occasional mod cues for loudness decay.   10/09/22: SLP reviewed the  rationale for BID completion of loud /a/ and everyday sentences. SLP ensured pt could obtain comparable loud /a/ measurements at d/c last course and he did so, with average low 90s dB. "I'm amazed I can still get up there that loud," pt told SLP. Pt stated everyday sentences at low 80s dB, comparable to previous course. Discussed goals and pt agreed.    PATIENT EDUCATION: Education details: see above in "today's treatment" Person educated: Patient Education method: Explanation, Demonstration, and Verbal cues Education comprehension: verbalized understanding and return demonstrated   HOME EXERCISE PROGRAM: Loud /a/ x6 BID, and everyday sentences.   GOALS: Goals reviewed with patient? Yes  SHORT TERM GOALS: Target date: 11/13/22    pt will produce loud /a/ with at least low 90s dB average over three sessions Baseline: 10/27/22, 10/29/22, 11/06/22 Goal status: Met  2.  Pt will produce 16/20 sentence responses with 70dB over two sessions Baseline: 10/29/22 Goal status: Met  3.  Pt will produce 5 minutes simple conversation with volume upper 60s dB average over three sessions Baseline:  Goal status: Ongoing  4.  pt will generate abdominal breathing 80% of the time when engaging in 5 minutes simple conversation in 2 sessions Baseline:  Goal status: Ongoing    LONG TERM GOALS: Target date: 12/18/22    Pt will score higher than 18/32 on CES in the final two ST sessions Baseline:  Goal status: Ongoing  2.  Pt will report successful usage of strategies for recall of people's names in 2 sessions Baseline:  Goal status: Ongoing  3.  Pt will maintain average of upper 60s dB over 10 minute simple-mod complex conversation with rare min A in three sessions  Baseline:  Goal status: Ongoing  4.  pt will use abdominal breathing 75% of the time in 10 minutes simple-mod complex conversation in three sessions  Baseline:  Goal status: Ongoing   ASSESSMENT:  CLINICAL IMPRESSION: Patient is  a 72 y.o. male who was seen today for treatment of speech clarity who demonstrated mod dysarthria and reduced breath support due to Parkinson's disease. See tx note from today. Pt would cont to benefit from skilled ST targeting loudness in conversation, and strategies for recalling people's names.  OBJECTIVE IMPAIRMENTS  Objective impairments include memory and dysarthria. These impairments are limiting patient from managing medications, household responsibilities, and effectively communicating at home and in community.Factors affecting potential to achieve goals and functional outcome are previous level of function and severity of impairments.. Patient will benefit from skilled SLP services to address above impairments and improve overall function.  REHAB POTENTIAL: Good  PLAN: SLP FREQUENCY: 2x/week  SLP DURATION: 8 weeks  PLANNED INTERVENTIONS: Environmental controls, Cueing hierachy, Internal/external aids, Functional tasks, Multimodal communication approach, SLP instruction and feedback, and Compensatory strategies    Marzetta Board, CCC-SLP 11/06/2022, 11:48 AM

## 2022-11-06 ENCOUNTER — Ambulatory Visit: Payer: Medicare Other | Admitting: Physical Therapy

## 2022-11-06 ENCOUNTER — Ambulatory Visit: Payer: Medicare Other

## 2022-11-06 ENCOUNTER — Encounter: Payer: Self-pay | Admitting: Physical Therapy

## 2022-11-06 DIAGNOSIS — R471 Dysarthria and anarthria: Secondary | ICD-10-CM | POA: Diagnosis not present

## 2022-11-06 DIAGNOSIS — R2689 Other abnormalities of gait and mobility: Secondary | ICD-10-CM | POA: Diagnosis not present

## 2022-11-06 DIAGNOSIS — R2681 Unsteadiness on feet: Secondary | ICD-10-CM

## 2022-11-06 DIAGNOSIS — R293 Abnormal posture: Secondary | ICD-10-CM | POA: Diagnosis not present

## 2022-11-06 DIAGNOSIS — R41841 Cognitive communication deficit: Secondary | ICD-10-CM | POA: Diagnosis not present

## 2022-11-06 DIAGNOSIS — R29818 Other symptoms and signs involving the nervous system: Secondary | ICD-10-CM | POA: Diagnosis not present

## 2022-11-06 NOTE — Therapy (Signed)
OUTPATIENT PHYSICAL THERAPY NEURO TREATMENT   Patient Name: [name] [name] MRN: [id number] DOB:Nov 24, 1950, 72 y.o., male Today's Date: 10/09/2022   PCP: Riki Sheer, DO REFERRING PROVIDER: Alonza Bogus, DO  END OF SESSION:  PT End of Session - 11/06/22 1234     Visit Number 6    Number of Visits 13    Date for PT Re-Evaluation 11/21/22    Authorization Type Medicare/BCBS    Progress Note Due on Visit 10    PT Start Time 1233    PT Stop Time 1316    PT Time Calculation (min) 43 min    Activity Tolerance Patient tolerated treatment well    Behavior During Therapy [facility] for tasks assessed/performed              Past Medical History:  Diagnosis Date   Cervical lymphadenopathy    right - followed by ENT   DOE (dyspnea on exertion) 07/15/2018   Dysphagia, neurologic 07/27/2014   Dystonia 1994   diagnosed in [place]   Enlarged lymph nodes 07/28/2007   Gastroesophageal reflux disease 03/07/2020   Localized swelling of right lower extremity 04/12/2019   Low back pain 11/21/2011   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   Mild neurocognitive disorder due to Parkinson's disease 01/10/2022   Non-Hodgkin lymphoma 2007   diffuse- 6 cycles of chemo with R CHOP Rituxan - last dose 10/08   Nonspecific elevation of levels of transaminase or lactic acid dehydrogenase (LDH) 07/06/2008   Parkinson's disease 07/27/2014   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   Past Surgical History:  Procedure Laterality Date   CHOLECYSTECTOMY, LAPAROSCOPIC     EYE SURGERY     x2 as child   PORT-A-CATH REMOVAL     PORTACATH PLACEMENT     SPLENECTOMY     TONSILLECTOMY     Patient Active Problem List   Diagnosis Date Noted   Mild neurocognitive disorder due to Parkinson's disease 01/10/2022   Gastroesophageal reflux disease 03/07/2020   Localized swelling of right lower extremity 04/12/2019   DOE (dyspnea on exertion) 07/15/2018   10 year risk of MI or stroke 7.5%  or greater 04/12/2018   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   Dysphagia, neurologic 07/27/2014   Parkinson's disease 07/27/2014   Dystonia 07/10/2014   Low back pain 11/21/2011   Nonspecific elevation of levels of transaminase or lactic acid dehydrogenase (LDH) 07/06/2008   Enlarged lymph nodes 07/28/2007   Non-Hodgkin lymphoma 03/23/2007    ONSET DATE: 09/08/2022 (MD referral)  REFERRING DIAG: G20.A1 (ICD-10-CM) - Parkinson's disease without dyskinesia or fluctuating manifestations   THERAPY DIAG:  Unsteadiness on feet  Other abnormalities of gait and mobility  Abnormal posture  Rationale for Evaluation and Treatment: Rehabilitation  SUBJECTIVE:  SUBJECTIVE STATEMENT: Nothing new, but I can't get voice volume up today.  No falls, can go up one full flight of steps with step-through pattern now. Pt accompanied by: self  PERTINENT HISTORY: See PMH above  PAIN:  Are you having pain?No pain today  PRECAUTIONS: Fall  WEIGHT BEARING RESTRICTIONS: No  FALLS: Has patient fallen in last 6 months? Yes. Number of falls 1  LIVING ENVIRONMENT: Lives with: lives with their family and lives with their spouse Lives in: House/apartment Stairs:  full flight to 2nd floor with bedroom and additional flight to 3rd floor man cave Has following equipment at home: Single point cane and Walker - 2 wheeled  PLOF: Independent  Not doing exercises and has not been attending [employer] Moves class  PATIENT GOALS: Getting more strength back  OBJECTIVE:    TODAY'S TREATMENT: 11/06/2022 Activity Comments  Sit<>stand 3 sets of 10, holding weighted ball, 1st set with overhead reach, 3rd set standing on Airex Cues for eccentric control  Squat to upright  stand 2 x 10 reps hold 6# weight   Farmer's carry, 6#, 25 ft x 8, with focus on turns 2 of 8 reps with turns with decreased foot clearance  Alt step taps to 6" step, 3#, BLEs, 10 reps Cues for foot clearance  Forward step ups x 15, 3# weights; BUE support and cues   Negotiating steps in clinic (4" and 6") x 4 reps, Bilat rails and step-through pattern Initial cues for full RLE foot placement on step  Sidestep together 3# weights, 2 x 10 reps   Braiding at counter, 2 reps Cues for slowed pace, good foot clearance  Short distance walk with turns-weightshifting turns and wider "hook" turns Also practiced weightshifting turns for narrower space turn, with cues needed for foot clearance  Reviewed doorway resisted scapular retraction Cues for keeping elbows tucked      PATIENT EDUCATION: Education details: Reviewed turning techniques Person educated: Patient Education method: Explanation, Demonstration, and Verbal cues Education comprehension: verbalized understanding, returned demonstration, and needs further education   Access Code: [email or web address] URL: [email or web address] Date: 10/27/2022 Prepared by: [facility]  Exercises - Seated Cervical Retraction  - 1 x daily - 7 x weekly - 5-10 reps - 3 sec hold - Supine Passive Cervical Retraction  - 1 x daily - 7 x weekly - 10 reps - 3 sec hold - Sit to Stand Without Arm Support  - 1 x daily - 7 x weekly - 5 sets - 5 reps - Scapular Retraction with Resistance  - 1 x daily - 5 x weekly - 2-3 sets - 10 reps - Doorway Pec Stretch at 60 Elevation  - 1 x daily - 5 x weekly - 1 sets - 3 reps - 15 sec hold - Forward Step Up  - 1 x daily - 5 x weekly - 2 sets - 10 reps - Standing Weight Shift  - 1 x daily - 7 x weekly - 1-2 sets - 5 reps - Marching forward  - 1 x daily - 7 x weekly - 1 sets - 4 reps    -------------------------------------------------------------- Objective measures below taken at  initial evaluation:  DIAGNOSTIC FINDINGS: NA  COGNITION: Overall cognitive status: Within functional limits for tasks assessed.  Per neurocognitive testing in 2023:  [facility] *Lower voice volume noted in eval conversations*  SENSATION: NT  POSTURE: rounded shoulders, forward head, and posterior pelvic tilt  Leans to right in unsupported sitting.  LOWER EXTREMITY ROM:   WFL A/ROM, seated position; P/ROM notes tightness bilateral hamstrings   LOWER EXTREMITY MMT:    MMT Right Eval Left Eval  Hip flexion 4 4+  Hip extension    Hip abduction    Hip adduction    Hip internal rotation    Hip external rotation    Knee flexion 4 4  Knee extension 4 4  Ankle dorsiflexion 3+ 3+  Ankle plantarflexion    Ankle inversion    Ankle eversion    (Blank rows = not tested)    TRANSFERS: Assistive device utilized: None  Sit to stand: CGA Stand to sit: CGA  STAIRS: Level of Assistance: Modified independence Stair Negotiation Technique: Alternating Pattern  with Bilateral Rails Number of Stairs: 3  Height of Stairs: 4"-6"  Comments: Pt does report that at home he has to change from step through to step-to pattern about half way up the steps, due to leg weakness.  GAIT: Gait pattern: step through pattern, shuffling, festinating, poor foot clearance- Right, and poor foot clearance- Left Distance walked: 50 ft x 4 Assistive device utilized: None Level of assistance: SBA Comments: Festinating noted at doorways and with turns to change direction  FUNCTIONAL TESTS:  5 times sit to stand: 10.87 sec, arms crossed at chest, retropulsion  Timed up and go (TUG): 11.97 sec 10 meter walk test: 11.68 sec = 2.81 ft/sec TUG cognitive:  12.03 sec TUG manual:  11.78 sec  TODAY'S TREATMENT:                                                                                                                              DATE: 10/09/2022        GOALS: Goals reviewed with patient? Yes  SHORT TERM  GOALS: Target date: 11/07/2022  Pt will be independent with HEP for improved balance, gait, functional strength. Baseline:  Has previously demonstrated Goal status: GOAL MET, 11/06/2022  2.  Pt will verbalize tips to reduce freezing/festination with gait, doorways, and turns.  Baseline:  Goal status: GOAL MET, 11/06/2022  3.  Pt will negotiate full flight of steps with handrail with step through pattern, to demonstrate improved functional strength. Baseline: reports half step-through and half step-to pattern; per report 11/06/2022, full stair flight Goal status: GOAL MET, 11/06/2022  LONG TERM GOALS: Target date: 11/21/2022  Pt will be independent with HEP for improved strength, balance, gait. Baseline:  Goal status: IN PROGRESS  2.  Pt will improve MiniBESTest score to at least 24/28 to decrease fall risk. Baseline:  22/28 Goal status: IN PROGRESS  3.  Pt will perform 5x sit<>stand, 2 of 3 trials with no retropulsion, to demonstrate improved transfer efficiency and functional lower extremity strength. Baseline:  Goal status: IN PROGRESS  4.  Pt will report 50% improvement in sit<>stand from low surfaces in home. Baseline:  Goal status: IN PROGRESS  ASSESSMENT:  CLINICAL IMPRESSION: Assessed STGs this visit, with pt meeting  3 of 3 STGs.  He is demonstrating and reporting improved turning and negotiation of small spaces in home with less festination and freezing episodes.  He is reporting improved ability for stair negotiation, able to negotiate full flight of stairs with step-through pattern (improved from half flight at eval).  He continues to have decreased foot clearance at times with turns and short distance gait.  He will continue to benefit from skilled PT towards improved strength, balance, gait for decreased festination episodes and decreased fall risk.    OBJECTIVE IMPAIRMENTS: Abnormal gait, decreased balance, decreased knowledge of use of DME, decreased mobility, difficulty  walking, decreased strength, impaired flexibility, and postural dysfunction.   ACTIVITY LIMITATIONS: standing, stairs, transfers, locomotion level, and caring for others  PARTICIPATION LIMITATIONS: community activity and hobbies  PERSONAL FACTORS: 3+ comorbidities: PMH above  are also affecting patient's functional outcome.   REHAB POTENTIAL: Good  CLINICAL DECISION MAKING: Evolving/moderate complexity  EVALUATION COMPLEXITY: Moderate  PLAN:  PT FREQUENCY: 2x/week  PT DURATION: 6 weeks plus eval visit  PLANNED INTERVENTIONS: Therapeutic exercises, Therapeutic activity, Neuromuscular re-education, Balance training, Gait training, Patient/Family education, Self Care, Stair training, and DME instructions  PLAN FOR NEXT SESSION: Progress HEP-consider farmer's carry or squats with weight.  Functional strengthening:   step ups, SLS.  Work on weightshifting, SLS, posture, continue to reinforcetips to reduce freezing with gait, doorways, turns, working towards [place].   Mady Haagensen, PT 11/06/22 1:21 PM Phone: [phone] Fax: [phone]  [facility] [address], [place], [place] [address] Phone # [phone] Fax # [phone]

## 2022-11-10 ENCOUNTER — Ambulatory Visit: Payer: Medicare Other

## 2022-11-10 ENCOUNTER — Ambulatory Visit: Payer: Medicare Other | Admitting: Physical Therapy

## 2022-11-12 ENCOUNTER — Ambulatory Visit: Payer: Medicare Other | Admitting: Physical Therapy

## 2022-11-12 ENCOUNTER — Ambulatory Visit: Payer: Medicare Other

## 2022-11-12 DIAGNOSIS — R41841 Cognitive communication deficit: Secondary | ICD-10-CM | POA: Diagnosis not present

## 2022-11-12 DIAGNOSIS — R293 Abnormal posture: Secondary | ICD-10-CM | POA: Diagnosis not present

## 2022-11-12 DIAGNOSIS — R2689 Other abnormalities of gait and mobility: Secondary | ICD-10-CM | POA: Diagnosis not present

## 2022-11-12 DIAGNOSIS — R2681 Unsteadiness on feet: Secondary | ICD-10-CM | POA: Diagnosis not present

## 2022-11-12 DIAGNOSIS — R471 Dysarthria and anarthria: Secondary | ICD-10-CM | POA: Diagnosis not present

## 2022-11-12 DIAGNOSIS — R29818 Other symptoms and signs involving the nervous system: Secondary | ICD-10-CM | POA: Diagnosis not present

## 2022-11-12 NOTE — Therapy (Signed)
OUTPATIENT SPEECH LANGUAGE PATHOLOGY PARKINSON'S TREATMENT   Patient Name: [name] [name] MRN: [id number] DOB:10/05/1950, 72 y.o., male Today's Date: 11/12/2022  PCP: Dr. Deloris Ping REFERRING PROVIDER: Alonza Bogus, DO   End of Session - 11/12/22 1530     Visit Number 7    Number of Visits 17    Date for SLP Re-Evaluation 12/18/22    SLP Start Time 1500    SLP Stop Time  1530    SLP Time Calculation (min) 30 min    Activity Tolerance Patient tolerated treatment well                  Past Medical History:  Diagnosis Date   Cervical lymphadenopathy    right - followed by ENT   DOE (dyspnea on exertion) 07/15/2018   Dysphagia, neurologic 07/27/2014   Dystonia 1994   diagnosed in [place]   Enlarged lymph nodes 07/28/2007   Gastroesophageal reflux disease 03/07/2020   Localized swelling of right lower extremity 04/12/2019   Low back pain 11/21/2011   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   Mild neurocognitive disorder due to Parkinson's disease 01/10/2022   Non-Hodgkin lymphoma 2007   diffuse- 6 cycles of chemo with R CHOP Rituxan - last dose 10/08   Nonspecific elevation of levels of transaminase or lactic acid dehydrogenase (LDH) 07/06/2008   Parkinson's disease 07/27/2014   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/02/2015   Past Surgical History:  Procedure Laterality Date   CHOLECYSTECTOMY, LAPAROSCOPIC     EYE SURGERY     x2 as child   PORT-A-CATH REMOVAL     PORTACATH PLACEMENT     SPLENECTOMY     TONSILLECTOMY     Patient Active Problem List   Diagnosis Date Noted   Mild neurocognitive disorder due to Parkinson's disease 01/10/2022   Gastroesophageal reflux disease 03/07/2020   Localized swelling of right lower extremity 04/12/2019   DOE (dyspnea on exertion) 07/15/2018   10 year risk of MI or stroke 7.5% or greater 04/12/2018   Post-splenectomy 04/02/2015   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    Dysphagia, neurologic 07/27/2014   Parkinson's disease 07/27/2014   Dystonia 07/10/2014   Low back pain 11/21/2011   Nonspecific elevation of levels of transaminase or lactic acid dehydrogenase (LDH) 07/06/2008   Enlarged lymph nodes 07/28/2007   Non-Hodgkin lymphoma 03/23/2007    ONSET DATE: Dx in mid-2010's  REFERRING DIAG: G20 - Parkinson's disease   THERAPY DIAG:  Dysarthria and anarthria  Cognitive communication deficit  Rationale for Evaluation and Treatment Rehabilitation  SUBJECTIVE:   SUBJECTIVE STATEMENT: "I got a contact on the way over here from [place]." Pt accompanied by: self  PAIN:  Are you having pain? Yes: NPRS scale: 1/10 Pain location: lower back Pain description: soreness  PATIENT GOALS: "I want to get back to where I was."  OBJECTIVE:   DIAGNOSTIC FINDINGS: From neuropsych note from April 2023: "[name] completed a comprehensive neuropsychological evaluation on 01/10/2022. Please refer to that encounter for the full report and recommendations. Briefly, results suggested isolated impaired performances across complex attention and semantic fluency. Performance variability was also exhibited across all aspects of learning and memory. Regarding etiology, the most likely culprit for ongoing cognitive weakness remains his past history of Parkinson's disease. While processing speed was improved relative to what might be expected, dysfunction surrounding complex attention and encoding/retrieval deficits of memory are quite common in this illness. The fact that motor/gait dysfunction was noted years prior to concern surrounding cognitive  decline is also a typical timeline associated with Parkinson's disease. Mild anxiety and sleep dysfunction could further influence cognitive performances. Research surrounding cognitive side effects of pramipexole is mixed; however, I cannot rule out an ongoing side effect contribution as well."   OBJECTIVE  ASSESSMENT:  Measured when a sound level meter was placed 30 cm away from pt's masked mouth, 10 minutes of simple conversational speech was reduced today, at average low 60s dB (WNL= average 70-72dB) with range of 59-67dB. Overall speech intelligibility for this listener is 95-100%. Production of loud /a/ averaged low 90s dB (range of 86 to 95dB) and initial rare min cues needed for loudness.   Pt rated effort level at 8/10 for production of loud /a/ (10=maximal effort). In 2 minutes short conversation task pt was asked to use the same amount of effort as with loud /a/. Loudness average with this increased effort was mid-upper 60s dB (range of 60 to 71dB) with occasional min verbal cues for loudness. Pt would benefit from skilled ST in order to improve speech intelligibility and pt's QOL.  [name]'s biggest concern at this time is his speech, as he tells SLP he is compensating well for attention and memory. However he does have concern about remembering people's names saying "I was never really good with names, but I have more trouble now."  Pt does not report difficulty with swallowing warranting further evaluation however SLP to monitor pt and inquire again about this during this therapy course.    PATIENT REPORTED OUTCOME MEASURES (PROM): Communication Effectiveness Survey: Pt scored 18/32 on this survey (pt scored 29/32 at discharge in August 2023).   Patient does not report any communication on the survey as 4 ("very effective"). He reports conversing with a familiar person over the telephone, being part of a conversation in a noisy environment, and speaking to a friend when he is emotionally upset as 3 ("mostly effective"). Scoring 2, or "Somewhat effective" was having a conversation with the family member or friends at home, participating in conversation with strangers in a quiet place, conversing with a stranger over the telephone, and having a conversation while traveling in a car. He rated 1  ("not at all effective") having a conversation with someone at a distance, like across the room.  TODAY'S TREATMENT:  11/12/22: oud /a/ measurements average low-mid 90s dB, everyday sentences were measured at average low-mid 80s dB. Pt's conversation was suboptimal (mid-upper 60s) mostly due to loudness decay.In 3-4 minute conversational segments pt req'd occasional mod cues for loudness. Pt with throat clearing frequently until SLP educated pt on ramifications of throat clearing. He drank caffeine free diet drink instead for 4/5 opportunities.   11/03/22: Loud /a/ measurements average low-mid 90s dB, everyday sentences were measured at average low-mid 80s dB. Pt and SLP talked in 3 minute conversational segments in mod complex conversation with pt having upper 60s dB.  10/29/22: Loud /a/ measurements average mid 90s dB, everyday sentences were measured at average mid-80s dB. In sentence tasks pt req'd rare min A for loudness decay. Pt and SLP talked in mod complex conversation with pt having upper 60s dB for 12 minutes.  10/27/22: Loud /a/ measurements average mid 90s dB, everyday sentences were at mid-80s dB. In sentence tasks pt req'd occasional min-mod A for loudness decay faded to rare min -mod A for loudness decay. SLP encouraged pt to focus on his effort with loud /a/. In 2-3 minutes conversation segments pt req'd occasional mod A for maintaining loudness.  10/20/22:  Loud /a/ measurements average low-mid 90s dB, everyday sentences were at mid low-80s dB. Sentence level responses today were average mid 60s dB with occasional min-mod cues for loudness, with longer responses. Conversational segments were produced with occasional mod cues for loudness decay.   10/09/22: SLP reviewed the rationale for BID completion of loud /a/ and everyday sentences. SLP ensured pt could obtain comparable loud /a/ measurements at d/c last course and he did so, with average low 90s dB. "I'm amazed I can still get up there  that loud," pt told SLP. Pt stated everyday sentences at low 80s dB, comparable to previous course. Discussed goals and pt agreed.    PATIENT EDUCATION: Education details: see above in "today's treatment" Person educated: Patient Education method: Explanation, Demonstration, and Verbal cues Education comprehension: verbalized understanding and return demonstrated   HOME EXERCISE PROGRAM: Loud /a/ x6 BID, and everyday sentences.   GOALS: Goals reviewed with patient? Yes  SHORT TERM GOALS: Target date: 11/13/22    pt will produce loud /a/ with at least low 90s dB average over three sessions Baseline: 10/27/22, 10/29/22 Goal status: Met  2.  Pt will produce 16/20 sentence responses with 70dB over two sessions Baseline: 10/29/22 Goal status: Met  3.  Pt will produce 5 minutes simple conversation with volume upper 60s dB average over three sessions Baseline:  Goal status: Not met  4.  pt will generate abdominal breathing 80% of the time when engaging in 5 minutes simple conversation in 2 sessions Baseline:  Goal status: partially met    LONG TERM GOALS: Target date: 12/18/22    Pt will score higher than 18/32 on CES in the final two ST sessions Baseline:  Goal status: Ongoing  2.  Pt will report successful usage of strategies for recall of people's names in 2 sessions Baseline:  Goal status: Ongoing  3.  Pt will maintain average of upper 60s dB over 10 minute simple-mod complex conversation with rare min A in three sessions  Baseline:  Goal status: Ongoing  4.  pt will use abdominal breathing 75% of the time in 10 minutes simple-mod complex conversation in three sessions  Baseline:  Goal status: Ongoing   ASSESSMENT:  CLINICAL IMPRESSION: Patient is a 71 y.o. male who was seen today for treatment of speech clarity who demonstrated mod dysarthria and reduced breath support due to Parkinson's disease. See tx note from today. Pt would cont to benefit from skilled ST  targeting loudness in conversation, and strategies for recalling people's names.  OBJECTIVE IMPAIRMENTS  Objective impairments include memory and dysarthria. These impairments are limiting patient from managing medications, household responsibilities, and effectively communicating at home and in community.Factors affecting potential to achieve goals and functional outcome are previous level of function and severity of impairments.. Patient will benefit from skilled SLP services to address above impairments and improve overall function.  REHAB POTENTIAL: Good  PLAN: SLP FREQUENCY: 2x/week  SLP DURATION: 8 weeks  PLANNED INTERVENTIONS: Environmental controls, Cueing hierachy, Internal/external aids, Functional tasks, Multimodal communication approach, SLP instruction and feedback, and Compensatory strategies    Janeann Paisley, CCC-SLP 11/12/2022, 3:31 PM

## 2022-11-17 ENCOUNTER — Ambulatory Visit: Payer: Medicare Other | Admitting: Physical Therapy

## 2022-11-18 ENCOUNTER — Ambulatory Visit: Payer: Medicare Other | Admitting: Physical Therapy

## 2022-11-20 ENCOUNTER — Ambulatory Visit: Payer: Medicare Other | Admitting: Physical Therapy

## 2022-11-20 ENCOUNTER — Ambulatory Visit: Payer: Medicare Other

## 2022-11-20 DIAGNOSIS — R2681 Unsteadiness on feet: Secondary | ICD-10-CM

## 2022-11-20 DIAGNOSIS — R29818 Other symptoms and signs involving the nervous system: Secondary | ICD-10-CM | POA: Diagnosis not present

## 2022-11-20 DIAGNOSIS — R2689 Other abnormalities of gait and mobility: Secondary | ICD-10-CM

## 2022-11-20 DIAGNOSIS — R293 Abnormal posture: Secondary | ICD-10-CM | POA: Diagnosis not present

## 2022-11-20 DIAGNOSIS — R471 Dysarthria and anarthria: Secondary | ICD-10-CM | POA: Diagnosis not present

## 2022-11-20 DIAGNOSIS — R41841 Cognitive communication deficit: Secondary | ICD-10-CM | POA: Diagnosis not present

## 2022-11-20 NOTE — Therapy (Signed)
OUTPATIENT PHYSICAL THERAPY NEURO TREATMENT/PROGRESS NOTE/RECERT   Patient Name: [name] [name] MRN: [id number] DOB:08/19/1951, 72 y.o., male Today's Date: 10/09/2022   PCP: Riki Sheer, DO REFERRING PROVIDER: Alonza Bogus, DO  Progress Note Reporting Period 10/09/2022 to 11/20/2022  See note below for Objective Data and Assessment of Progress/Goals.     END OF SESSION:  PT End of Session - 11/20/22 1409     Visit Number 7    Number of Visits 15    Date for PT Re-Evaluation 0000000   per recert [id number] visit   Authorization Type Medicare/BCBS    Progress Note Due on Visit 17   PN completed at visit 6   PT Start Time 1407    PT Stop Time 1447    PT Time Calculation (min) 40 min    Activity Tolerance Patient tolerated treatment well    Behavior During Therapy [facility] for tasks assessed/performed              Past Medical History:  Diagnosis Date   Cervical lymphadenopathy    right - followed by ENT   DOE (dyspnea on exertion) 07/15/2018   Dysphagia, neurologic 07/27/2014   Dystonia 1994   diagnosed in [place]   Enlarged lymph nodes 07/28/2007   Gastroesophageal reflux disease 03/07/2020   Localized swelling of right lower extremity 04/12/2019   Low back pain 11/21/2011   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   Mild neurocognitive disorder due to Parkinson's disease 01/10/2022   Non-Hodgkin lymphoma 2007   diffuse- 6 cycles of chemo with R CHOP Rituxan - last dose 10/08   Nonspecific elevation of levels of transaminase or lactic acid dehydrogenase (LDH) 07/06/2008   Parkinson's disease 07/27/2014   Post-splenectomy 04/02/2015   Past Surgical History:  Procedure Laterality Date   CHOLECYSTECTOMY, LAPAROSCOPIC     EYE SURGERY     x2 as child   PORT-A-CATH REMOVAL     PORTACATH PLACEMENT     SPLENECTOMY     TONSILLECTOMY     Patient Active Problem List   Diagnosis Date Noted   Mild neurocognitive disorder due to  Parkinson's disease 01/10/2022   Gastroesophageal reflux disease 03/07/2020   Localized swelling of right lower extremity 04/12/2019   DOE (dyspnea on exertion) 07/15/2018   10 year risk of MI or stroke 7.5% or greater 04/12/2018   Post-splenectomy 04/02/2015   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   Dysphagia, neurologic 07/27/2014   Parkinson's disease 07/27/2014   Dystonia 07/10/2014   Low back pain 11/21/2011   Nonspecific elevation of levels of transaminase or lactic acid dehydrogenase (LDH) 07/06/2008   Enlarged lymph nodes 07/28/2007   Non-Hodgkin lymphoma 03/23/2007    ONSET DATE: 09/08/2022 (MD referral)  REFERRING DIAG: G20.A1 (ICD-10-CM) - Parkinson's disease without dyskinesia or fluctuating manifestations   THERAPY DIAG:  Unsteadiness on feet  Other abnormalities of gait and mobility  Abnormal posture  Other symptoms and signs involving the nervous system  Rationale for Evaluation and Treatment: Rehabilitation  SUBJECTIVE:  SUBJECTIVE STATEMENT: Started a chart for exercises.  Doorways are still hard.   Pt accompanied by: self  PERTINENT HISTORY: See PMH above  PAIN:  Are you having pain?No pain today  PRECAUTIONS: Fall  WEIGHT BEARING RESTRICTIONS: No  FALLS: Has patient fallen in last 6 months? Yes. Number of falls 1  LIVING ENVIRONMENT: Lives with: lives with their family and lives with their spouse Lives in: House/apartment Stairs:  full flight to 2nd floor with bedroom and additional flight to 3rd floor man cave Has following equipment at home: Single point cane and Walker - 2 wheeled  PLOF: Independent  Not doing exercises and has not been attending PWR! Moves class  PATIENT GOALS: Getting more strength back  OBJECTIVE:     TODAY'S TREATMENT: 11/20/2022 Activity Comments  Sit<>stand 10 reps with PWR! Up posture with standing   5x sit<>stand:  8.03 sec, then 9.31 sec no retropulsion   Practiced varied short distance gait and turns -rocking turn -step and go-turn -progressing to perform in smaller spaces Prefers rocking/hook turn  Farmer's carry 6# weight, alternating arms, 8 reps x 25 ft, also able to perform smooth turns           Pt performs PWR! Moves in standing position x 10 reps.  Standing at doorframe for tactile cues.  Several initial reps for preparation, then cues for increased activation, intensity of movement pattern   PWR! Up for improved posture  PWR! Rock for improved weighshifting  PWR! Twist for improved trunk rotation   PWR! Step forward x 10, backward step x 10 for improved step initiation   Cues provided for positioning at doorframe and for slowing pace between reps.     PATIENT EDUCATION: Education details: Reviewed turning techniques, PWR! Moves in standing at doorway (see instructions section of Flow sheet) Person educated: Patient Education method: Explanation, Demonstration, and Verbal cues Education comprehension: verbalized understanding, returned demonstration, and needs further education   Access Code: [email or web address] URL: [email or web address] Date: 11/20/2022 Prepared by: [facility]  Exercises - Seated Cervical Retraction  - 1 x daily - 7 x weekly - 5-10 reps - 3 sec hold - Supine Passive Cervical Retraction  - 1 x daily - 7 x weekly - 10 reps - 3 sec hold - Sit to Stand Without Arm Support  - 1 x daily - 7 x weekly - 5 sets - 5 reps - Scapular Retraction with Resistance  - 1 x daily - 5 x weekly - 2-3 sets - 10 reps - Doorway Pec Stretch at 60 Elevation  - 1 x daily - 5 x weekly - 1 sets - 3 reps - 15 sec hold - Forward Step Up  - 1 x daily - 5 x weekly - 2 sets - 10 reps - Standing Weight Shift  - 1 x daily - 7  x weekly - 1-2 sets - 5 reps - Marching forward  - 1 x daily - 7 x weekly - 1 sets - 4 reps  *PWR! Moves standing x 10 reps, daily, at doorframe for reference.  Perform forward and backward stepping at doorway  -------------------------------------------------------------- Objective measures below taken at initial evaluation:  DIAGNOSTIC FINDINGS: NA  COGNITION: Overall cognitive status: Within functional limits for tasks assessed.  Per neurocognitive testing in 2023:  [facility] *Lower voice volume noted in eval conversations*  SENSATION: NT  POSTURE: rounded shoulders, forward head, and posterior pelvic tilt  Leans to right in unsupported sitting.  LOWER EXTREMITY ROM:   WFL A/ROM, seated position; P/ROM notes tightness bilateral hamstrings   LOWER EXTREMITY MMT:    MMT Right Eval Left Eval  Hip flexion 4 4+  Hip extension    Hip abduction    Hip adduction    Hip internal rotation    Hip external rotation    Knee flexion 4 4  Knee extension 4 4  Ankle dorsiflexion 3+ 3+  Ankle plantarflexion    Ankle inversion    Ankle eversion    (Blank rows = not tested)    TRANSFERS: Assistive device utilized: None  Sit to stand: CGA Stand to sit: CGA  STAIRS: Level of Assistance: Modified independence Stair Negotiation Technique: Alternating Pattern  with Bilateral Rails Number of Stairs: 3  Height of Stairs: 4"-6"  Comments: Pt does report that at home he has to change from step through to step-to pattern about half way up the steps, due to leg weakness.  GAIT: Gait pattern: step through pattern, shuffling, festinating, poor foot clearance- Right, and poor foot clearance- Left Distance walked: 50 ft x 4 Assistive device utilized: None Level of assistance: SBA Comments: Festinating noted at doorways and with turns to change direction  FUNCTIONAL TESTS:  5 times sit to stand: 10.87 sec, arms crossed at chest, retropulsion  Timed up and go (TUG): 11.97 sec 10 meter walk  test: 11.68 sec = 2.81 ft/sec TUG cognitive:  12.03 sec TUG manual:  11.78 sec        GOALS: Goals reviewed with patient? Yes  SHORT TERM GOALS: Target date: 11/07/2022  Pt will be independent with HEP for improved balance, gait, functional strength. Baseline:  Has previously demonstrated Goal status: GOAL MET, 11/06/2022  2.  Pt will verbalize tips to reduce freezing/festination with gait, doorways, and turns.  Baseline:  Goal status: GOAL MET, 11/06/2022  3.  Pt will negotiate full flight of steps with handrail with step through pattern, to demonstrate improved functional strength. Baseline: reports half step-through and half step-to pattern; per report 11/06/2022, full stair flight Goal status: GOAL MET, 11/06/2022  LONG TERM GOALS: Target date: 11/21/2022>UPDATED to 12/19/2022  Pt will be independent with HEP for improved strength, balance, gait. Baseline: continueing to progress HEP Goal status: IN PROGRESS, 11/20/2022  2.  Pt will improve MiniBESTest score to at least 24/28 to decrease fall risk. Baseline:  22/28 Goal status: IN PROGRESS, 11/20/2022  3.  Pt will perform 5x sit<>stand, 2 of 3 trials with no retropulsion, to demonstrate improved transfer efficiency and functional lower extremity strength. Baseline: Performed 2 trials with pt having retropulsion 1 trial Goal status: IN PROGRESS, 11/20/2022  4.  Pt will report 50% improvement in sit<>stand from low surfaces in home. Baseline:  Goal status: IN PROGRESS, 11/20/2022  ASSESSMENT:  CLINICAL IMPRESSION: Pt has not been able to make frequency over the past several weeks due to primary PT being out sick and pt unable to be rescheduled.  He has not been seen since 11/06/2022 when STGs were checked.  He reports noting improvement in posture and still having difficulty with negotiating doorways.  At last check, he was able to go up full flight of stairs with step-through pattern without stopping, indicating increase in  functional strength.  He is performing FTSTS with good speed, but continues to have some retropulsion towards end of set.  LTGs remain appropriate and pt will continue to benefit from skilled PT towards LTG (recert completed today) for additional balance, functional strength and practice  on decreased festination and freezing of gait, especially with doorways.   OBJECTIVE IMPAIRMENTS: Abnormal gait, decreased balance, decreased knowledge of use of DME, decreased mobility, difficulty walking, decreased strength, impaired flexibility, and postural dysfunction.   ACTIVITY LIMITATIONS: standing, stairs, transfers, locomotion level, and caring for others  PARTICIPATION LIMITATIONS: community activity and hobbies  PERSONAL FACTORS: 3+ comorbidities: PMH above  are also affecting patient's functional outcome.   REHAB POTENTIAL: Good  CLINICAL DECISION MAKING: Evolving/moderate complexity  EVALUATION COMPLEXITY: Moderate  PLAN:  PT FREQUENCY: 2x/week  PT DURATION: 4 weeks per recert [id number] visit  PLANNED INTERVENTIONS: Therapeutic exercises, Therapeutic activity, Neuromuscular re-education, Balance training, Gait training, Patient/Family education, Self Care, Stair training, and DME instructions  PLAN FOR NEXT SESSION: Progress HEP-consider farmer's carry or squats with weight.  Continue functional strengthening:   step ups, SLS.  Work on weightshifting, SLS, posture, continue to reinforcetips to reduce freezing with gait, doorways, turns, working towards [place].  Review addition of standing PWR! Moves at doorway.   Mady Haagensen, PT 11/21/22 12:37 PM Phone: [phone] Fax: [phone]  [facility] [place], [place], [place] [address] Phone # [phone] Fax # [phone]

## 2022-11-20 NOTE — Therapy (Signed)
OUTPATIENT SPEECH LANGUAGE PATHOLOGY [facility]   Patient Name: [name] [name] MRN: [id number] DOB:Jun 22, 1951, 72 y.o., male Today's Date: 11/20/2022  PCP: Dr. Deloris Ping REFERRING PROVIDER: Tat, Wells Guiles, DO   End of Session - 11/20/22 1458     Visit Number 8    Number of Visits 17    Date for SLP Re-Evaluation 12/18/22    SLP Start Time 1455   pt using restroom at 25   SLP Stop Time  1530    SLP Time Calculation (min) 35 min    Activity Tolerance Patient tolerated treatment well                  Past Medical History:  Diagnosis Date   Cervical lymphadenopathy    right - followed by ENT   DOE (dyspnea on exertion) 07/15/2018   Dysphagia, neurologic 07/27/2014   Dystonia 1994   diagnosed in [place]   Enlarged lymph nodes 07/28/2007   Gastroesophageal reflux disease 03/07/2020   Localized swelling of right lower extremity 04/12/2019   Low back pain 11/21/2011   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   Mild neurocognitive disorder due to Parkinson's disease 01/10/2022   Non-Hodgkin lymphoma 2007   diffuse- 6 cycles of chemo with R CHOP Rituxan - last dose 10/08   Nonspecific elevation of levels of transaminase or lactic acid dehydrogenase (LDH) 07/06/2008   Parkinson's disease 07/27/2014   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/02/2015   Past Surgical History:  Procedure Laterality Date   CHOLECYSTECTOMY, LAPAROSCOPIC     EYE SURGERY     x2 as child   PORT-A-CATH REMOVAL     PORTACATH PLACEMENT     SPLENECTOMY     TONSILLECTOMY     Patient Active Problem List   Diagnosis Date Noted   Mild neurocognitive disorder due to Parkinson's disease 01/10/2022   Gastroesophageal reflux disease 03/07/2020   Localized swelling of right lower extremity 04/12/2019   DOE (dyspnea on exertion) 07/15/2018   10 year risk of MI or stroke 7.5% or greater 04/12/2018   Post-splenectomy 04/02/2015   Meige syndrome (blepharospasm with oromandibular  dystonia) 07/27/2014   Dysphagia, neurologic 07/27/2014   Parkinson's disease 07/27/2014   Dystonia 07/10/2014   Low back pain 11/21/2011   Nonspecific elevation of levels of transaminase or lactic acid dehydrogenase (LDH) 07/06/2008   Enlarged lymph nodes 07/28/2007   Non-Hodgkin lymphoma 03/23/2007    ONSET DATE: Dx in mid-2010's  REFERRING DIAG: G20 - Parkinson's disease   THERAPY DIAG:  No diagnosis found.  Rationale for Evaluation and Treatment Rehabilitation  SUBJECTIVE:   SUBJECTIVE STATEMENT: "The phone is actually going much better." Pt accompanied by: self  PAIN:  Are you having pain? Yes: NPRS scale: 1/10 Pain location: lower back Pain description: soreness  PATIENT GOALS: "I want to get back to where I was."  OBJECTIVE:   DIAGNOSTIC FINDINGS: From neuropsych note from April 2023: "[name] completed a comprehensive neuropsychological evaluation on 01/10/2022. Please refer to that encounter for the full report and recommendations. Briefly, results suggested isolated impaired performances across complex attention and semantic fluency. Performance variability was also exhibited across all aspects of learning and memory. Regarding etiology, the most likely culprit for ongoing cognitive weakness remains his past history of Parkinson's disease. While processing speed was improved relative to what might be expected, dysfunction surrounding complex attention and encoding/retrieval deficits of memory are quite common in this illness. The fact that motor/gait dysfunction was noted years prior to concern surrounding cognitive decline is  also a typical timeline associated with Parkinson's disease. Mild anxiety and sleep dysfunction could further influence cognitive performances. Research surrounding cognitive side effects of pramipexole is mixed; however, I cannot rule out an ongoing side effect contribution as well."   OBJECTIVE ASSESSMENT:  Measured when a sound level meter  was placed 30 cm away from pt's masked mouth, 10 minutes of simple conversational speech was reduced today, at average low 60s dB (WNL= average 70-72dB) with range of 59-67dB. Overall speech intelligibility for this listener is 95-100%. Production of loud /a/ averaged low 90s dB (range of 86 to 95dB) and initial rare min cues needed for loudness.   Pt rated effort level at 8/10 for production of loud /a/ (10=maximal effort). In 2 minutes short conversation task pt was asked to use the same amount of effort as with loud /a/. Loudness average with this increased effort was mid-upper 60s dB (range of 60 to 71dB) with occasional min verbal cues for loudness. Pt would benefit from skilled ST in order to improve speech intelligibility and pt's QOL.  [name]'s biggest concern at this time is his speech, as he tells SLP he is compensating well for attention and memory. However he does have concern about remembering people's names saying "I was never really good with names, but I have more trouble now."  Pt does not report difficulty with swallowing warranting further evaluation however SLP to monitor pt and inquire again about this during this therapy course.    PATIENT REPORTED OUTCOME MEASURES (PROM): Communication Effectiveness Survey: Pt scored 18/32 on this survey (pt scored 29/32 at discharge in August 2023).   Patient does not report any communication on the survey as 4 ("very effective"). He reports conversing with a familiar person over the telephone, being part of a conversation in a noisy environment, and speaking to a friend when he is emotionally upset as 3 ("mostly effective"). Scoring 2, or "Somewhat effective" was having a conversation with the family member or friends at home, participating in conversation with strangers in a quiet place, conversing with a stranger over the telephone, and having a conversation while traveling in a car. He rated 1 ("not at all effective") having a conversation with  someone at a distance, like across the room.  TODAY'S TREATMENT:  2/2/924: SLP educated pt about memory strategies for people's names (WARM); pt used an example of a current name he couldn't recall and when he did he wrote it downa nd repeated it to himself 5x, upon direction from SLP.  Loud /a/ measurements average mid 90s dB, everyday sentences were measured at average low-mid 80sdB. Conversational segments of 5 minute intervals resulted in pt WNL loudness for first 3 segments and after this, pt req'd more intense and more frequent cueing for the next 3 segments. Pt self-corrected once in the first segment and once in the second segment but did not self-correct after that.  11/12/22: Loud /a/ measurements average low-mid 90s dB, everyday sentences were measured at average low-mid 80s dB. Pt's conversation was suboptimal (mid-upper 60s) mostly due to loudness decay.In 3-4 minute conversational segments pt req'd occasional mod cues for loudness. Pt with throat clearing frequently until SLP educated pt on ramifications of throat clearing. He drank caffeine free diet drink instead for 4/5 opportunities.   11/03/22: Loud /a/ measurements average low-mid 90s dB, everyday sentences were measured at average low-mid 80s dB. Pt and SLP talked in 3 minute conversational segments in mod complex conversation with pt having upper 60s dB.  10/29/22: Loud /a/ measurements average mid 90s dB, everyday sentences were measured at average mid-80s dB. In sentence tasks pt req'd rare min A for loudness decay. Pt and SLP talked in mod complex conversation with pt having upper 60s dB for 12 minutes.  10/27/22: Loud /a/ measurements average mid 90s dB, everyday sentences were at mid-80s dB. In sentence tasks pt req'd occasional min-mod A for loudness decay faded to rare min -mod A for loudness decay. SLP encouraged pt to focus on his effort with loud /a/. In 2-3 minutes conversation segments pt req'd occasional mod A for maintaining  loudness.  10/20/22: Loud /a/ measurements average low-mid 90s dB, everyday sentences were at mid low-80s dB. Sentence level responses today were average mid 60s dB with occasional min-mod cues for loudness, with longer responses. Conversational segments were produced with occasional mod cues for loudness decay.   10/09/22: SLP reviewed the rationale for BID completion of loud /a/ and everyday sentences. SLP ensured pt could obtain comparable loud /a/ measurements at d/c last course and he did so, with average low 90s dB. "I'm amazed I can still get up there that loud," pt told SLP. Pt stated everyday sentences at low 80s dB, comparable to previous course. Discussed goals and pt agreed.    PATIENT EDUCATION: Education details: see above in "today's treatment" Person educated: Patient Education method: Explanation, Demonstration, and Verbal cues Education comprehension: verbalized understanding and return demonstrated   HOME EXERCISE PROGRAM: Loud /a/ x6 BID, and everyday sentences.   GOALS: Goals reviewed with patient? Yes  SHORT TERM GOALS: Target date: 11/13/22    pt will produce loud /a/ with at least low 90s dB average over three sessions Baseline: 10/27/22, 10/29/22 Goal status: Met  2.  Pt will produce 16/20 sentence responses with 70dB over two sessions Baseline: 10/29/22 Goal status: Met  3.  Pt will produce 5 minutes simple conversation with volume upper 60s dB average over three sessions Baseline:  Goal status: Not met  4.  pt will generate abdominal breathing 80% of the time when engaging in 5 minutes simple conversation in 2 sessions Baseline:  Goal status: partially met    LONG TERM GOALS: Target date: 12/18/22    Pt will score higher than 18/32 on CES in the final two ST sessions Baseline:  Goal status: Ongoing  2.  Pt will report successful usage of strategies for recall of people's names in 2 sessions Baseline:  Goal status: Ongoing  3.  Pt will maintain  average of upper 60s dB over 10 minute simple-mod complex conversation with rare min A in three sessions  Baseline:  Goal status: Ongoing  4.  pt will use abdominal breathing 75% of the time in 10 minutes simple-mod complex conversation in three sessions  Baseline:  Goal status: Ongoing   ASSESSMENT:  CLINICAL IMPRESSION: Patient is a 72 y.o. male who was seen today for treatment of speech clarity who demonstrated mod dysarthria and reduced breath support due to Parkinson's disease. See tx note from today. Pt would cont to benefit from skilled ST targeting loudness in conversation, and strategies for recalling people's names.  OBJECTIVE IMPAIRMENTS  Objective impairments include memory and dysarthria. These impairments are limiting patient from managing medications, household responsibilities, and effectively communicating at home and in community.Factors affecting potential to achieve goals and functional outcome are previous level of function and severity of impairments.. Patient will benefit from skilled SLP services to address above impairments and improve overall function.  REHAB POTENTIAL:  Good  PLAN: SLP FREQUENCY: 2x/week  SLP DURATION: 8 weeks  PLANNED INTERVENTIONS: Environmental controls, Cueing hierachy, Internal/external aids, Functional tasks, Multimodal communication approach, SLP instruction and feedback, and Compensatory strategies    Ingra Rother, CCC-SLP 11/20/2022, 3:09 PM

## 2022-11-20 NOTE — Patient Instructions (Addendum)
   Use "WARM" strategy for recalling people's names (or anything else you wish to remember!) W= write it down A=  associate it R=  repeat it M=  make a mental picture

## 2022-11-21 ENCOUNTER — Encounter: Payer: Self-pay | Admitting: Physical Therapy

## 2022-11-24 ENCOUNTER — Ambulatory Visit: Payer: Medicare Other | Attending: Neurology | Admitting: Physical Therapy

## 2022-11-24 ENCOUNTER — Ambulatory Visit: Payer: Medicare Other

## 2022-11-24 ENCOUNTER — Encounter: Payer: Self-pay | Admitting: Physical Therapy

## 2022-11-24 DIAGNOSIS — R471 Dysarthria and anarthria: Secondary | ICD-10-CM

## 2022-11-24 DIAGNOSIS — R2681 Unsteadiness on feet: Secondary | ICD-10-CM

## 2022-11-24 DIAGNOSIS — R41841 Cognitive communication deficit: Secondary | ICD-10-CM

## 2022-11-24 DIAGNOSIS — R4701 Aphasia: Secondary | ICD-10-CM | POA: Insufficient documentation

## 2022-11-24 DIAGNOSIS — R2689 Other abnormalities of gait and mobility: Secondary | ICD-10-CM

## 2022-11-24 DIAGNOSIS — R29818 Other symptoms and signs involving the nervous system: Secondary | ICD-10-CM | POA: Insufficient documentation

## 2022-11-24 DIAGNOSIS — R293 Abnormal posture: Secondary | ICD-10-CM | POA: Diagnosis not present

## 2022-11-24 NOTE — Therapy (Signed)
OUTPATIENT SPEECH LANGUAGE PATHOLOGY [facility]   Patient Name: [name] [name] MRN: [id number] DOB:1951/05/16, 72 y.o., male Today's Date: 11/24/2022  PCP: Dr. Deloris Ping REFERRING PROVIDER: Tat, Wells Guiles, DO   End of Session - 11/24/22 1459     Visit Number 9    Number of Visits 17    Date for SLP Re-Evaluation 12/18/22    SLP Start Time 1455   in restroom at 1449   SLP Stop Time  1530    SLP Time Calculation (min) 35 min    Activity Tolerance Patient tolerated treatment well                  Past Medical History:  Diagnosis Date   Cervical lymphadenopathy    right - followed by ENT   DOE (dyspnea on exertion) 07/15/2018   Dysphagia, neurologic 07/27/2014   Dystonia 1994   diagnosed in [place]   Enlarged lymph nodes 07/28/2007   Gastroesophageal reflux disease 03/07/2020   Localized swelling of right lower extremity 04/12/2019   Low back pain 11/21/2011   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   Mild neurocognitive disorder due to Parkinson's disease 01/10/2022   Non-Hodgkin lymphoma 2007   diffuse- 6 cycles of chemo with R CHOP Rituxan - last dose 10/08   Nonspecific elevation of levels of transaminase or lactic acid dehydrogenase (LDH) 07/06/2008   Parkinson's disease 07/27/2014   Post-splenectomy 04/02/2015   Past Surgical History:  Procedure Laterality Date   CHOLECYSTECTOMY, LAPAROSCOPIC     EYE SURGERY     x2 as child   PORT-A-CATH REMOVAL     PORTACATH PLACEMENT     SPLENECTOMY     TONSILLECTOMY     Patient Active Problem List   Diagnosis Date Noted   Mild neurocognitive disorder due to Parkinson's disease 01/10/2022   Gastroesophageal reflux disease 03/07/2020   Localized swelling of right lower extremity 04/12/2019   DOE (dyspnea on exertion) 07/15/2018   10 year risk of MI or stroke 7.5% or greater 04/12/2018   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/02/2015   Meige syndrome (blepharospasm with oromandibular  dystonia) 07/27/2014   Dysphagia, neurologic 07/27/2014   Parkinson's disease 07/27/2014   Dystonia 07/10/2014   Low back pain 11/21/2011   Nonspecific elevation of levels of transaminase or lactic acid dehydrogenase (LDH) 07/06/2008   Enlarged lymph nodes 07/28/2007   Non-Hodgkin lymphoma 03/23/2007    ONSET DATE: Dx in mid-2010's  REFERRING DIAG: G20 - Parkinson's disease   THERAPY DIAG:  Dysarthria and anarthria  Cognitive communication deficit  Rationale for Evaluation and Treatment Rehabilitation  SUBJECTIVE:   SUBJECTIVE STATEMENT: "She asked me to repeat twice yesterday (Bible study)" [name] [name] reports [name] [name] is doing better at understanding him.  Pt accompanied by: self  PAIN:  Are you having pain? Yes: NPRS scale: 1/10 Pain location: neck Pain description: soreness  PATIENT GOALS: "I want to get back to where I was."  OBJECTIVE:   DIAGNOSTIC FINDINGS: From neuropsych note from April 2023: "[name] completed a comprehensive neuropsychological evaluation on 01/10/2022. Please refer to that encounter for the full report and recommendations. Briefly, results suggested isolated impaired performances across complex attention and semantic fluency. Performance variability was also exhibited across all aspects of learning and memory. Regarding etiology, the most likely culprit for ongoing cognitive weakness remains his past history of Parkinson's disease. While processing speed was improved relative to what might be expected, dysfunction surrounding complex attention and encoding/retrieval deficits of memory are quite common in this illness. The  fact that motor/gait dysfunction was noted years prior to concern surrounding cognitive decline is also a typical timeline associated with Parkinson's disease. Mild anxiety and sleep dysfunction could further influence cognitive performances. Research surrounding cognitive side effects of pramipexole is mixed; however, I cannot rule out an  ongoing side effect contribution as well."   PATIENT REPORTED OUTCOME MEASURES (PROM): Communication Effectiveness Survey: Pt scored 18/32 on this survey (pt scored 29/32 at discharge in August 2023).   Patient does not report any communication on the survey as 4 ("very effective"). He reports conversing with a familiar person over the telephone, being part of a conversation in a noisy environment, and speaking to a friend when he is emotionally upset as 3 ("mostly effective"). Scoring 2, or "Somewhat effective" was having a conversation with the family member or friends at home, participating in conversation with strangers in a quiet place, conversing with a stranger over the telephone, and having a conversation while traveling in a car. He rated 1 ("not at all effective") having a conversation with someone at a distance, like across the room.  TODAY'S TREATMENT:  11/24/22: "It's neat I can feel my diaphragm kick in with the /a/'s." SLP reinforced taht is what pt needs to feel with talking as well. "Oh, I know" pt stated.  Loud /a/ measurements average mid 90s dB, everyday sentences were measured at average low-mid 80sdB. SLP and pt talked in conversation for 5-7 minute segments and pt maintained upper 60s dB with occasional min A.   2/2/924: SLP educated pt about memory strategies for people's names (WARM); pt used an example of a current name he couldn't recall and when he did he wrote it downa nd repeated it to himself 5x, upon direction from SLP.  Loud /a/ measurements average mid 90s dB, everyday sentences were measured at average low-mid 80sdB. Conversational segments of 5 minute intervals resulted in pt WNL loudness for first 3 segments and after this, pt req'd more intense and more frequent cueing for the next 3 segments. Pt self-corrected once in the first segment and once in the second segment but did not self-correct after that.  11/12/22: Loud /a/ measurements average low-mid 90s dB, everyday  sentences were measured at average low-mid 80s dB. Pt's conversation was suboptimal (mid-upper 60s) mostly due to loudness decay.In 3-4 minute conversational segments pt req'd occasional mod cues for loudness. Pt with throat clearing frequently until SLP educated pt on ramifications of throat clearing. He drank caffeine free diet drink instead for 4/5 opportunities.   11/03/22: Loud /a/ measurements average low-mid 90s dB, everyday sentences were measured at average low-mid 80s dB. Pt and SLP talked in 3 minute conversational segments in mod complex conversation with pt having upper 60s dB.  10/29/22: Loud /a/ measurements average mid 90s dB, everyday sentences were measured at average mid-80s dB. In sentence tasks pt req'd rare min A for loudness decay. Pt and SLP talked in mod complex conversation with pt having upper 60s dB for 12 minutes.  10/27/22: Loud /a/ measurements average mid 90s dB, everyday sentences were at mid-80s dB. In sentence tasks pt req'd occasional min-mod A for loudness decay faded to rare min -mod A for loudness decay. SLP encouraged pt to focus on his effort with loud /a/. In 2-3 minutes conversation segments pt req'd occasional mod A for maintaining loudness.   PATIENT EDUCATION: Education details: see above in "today's treatment" Person educated: Patient Education method: Explanation, Demonstration, and Verbal cues Education comprehension: verbalized understanding and return  demonstrated   HOME EXERCISE PROGRAM: Loud /a/ x6 BID, and everyday sentences.   GOALS: Goals reviewed with patient? Yes  SHORT TERM GOALS: Target date: 11/13/22    pt will produce loud /a/ with at least low 90s dB average over three sessions Baseline: 10/27/22, 10/29/22 Goal status: Met  2.  Pt will produce 16/20 sentence responses with 70dB over two sessions Baseline: 10/29/22 Goal status: Met  3.  Pt will produce 5 minutes simple conversation with volume upper 60s dB average over three  sessions Baseline:  Goal status: Not met  4.  pt will generate abdominal breathing 80% of the time when engaging in 5 minutes simple conversation in 2 sessions Baseline:  Goal status: partially met    LONG TERM GOALS: Target date: 12/18/22    Pt will score higher than 18/32 on CES in the final two ST sessions Baseline:  Goal status: Ongoing  2.  Pt will report successful usage of strategies for recall of people's names in 2 sessions Baseline:  Goal status: Ongoing  3.  Pt will maintain average of upper 60s dB over 10 minute simple-mod complex conversation with rare min A in three sessions  Baseline:  Goal status: Ongoing  4.  pt will use abdominal breathing 75% of the time in 10 minutes simple-mod complex conversation in three sessions  Baseline:  Goal status: Ongoing   ASSESSMENT:  CLINICAL IMPRESSION: Patient is a 72 y.o. male who was seen today for treatment of speech clarity who demonstrated mod dysarthria and reduced breath support due to Parkinson's disease. See tx note from today. Pt would cont to benefit from skilled ST targeting loudness in conversation, and strategies for recalling people's names.  OBJECTIVE IMPAIRMENTS  Objective impairments include memory and dysarthria. These impairments are limiting patient from managing medications, household responsibilities, and effectively communicating at home and in community.Factors affecting potential to achieve goals and functional outcome are previous level of function and severity of impairments.. Patient will benefit from skilled SLP services to address above impairments and improve overall function.  REHAB POTENTIAL: Good  PLAN: SLP FREQUENCY: 2x/week  SLP DURATION: 8 weeks  PLANNED INTERVENTIONS: Environmental controls, Cueing hierachy, Internal/external aids, Functional tasks, Multimodal communication approach, SLP instruction and feedback, and Compensatory strategies    Antoino Westhoff, CCC-SLP 11/24/2022, 3:00  PM

## 2022-11-24 NOTE — Therapy (Signed)
OUTPATIENT PHYSICAL THERAPY NEURO TREATMENT   Patient Name: [name] [name] MRN: [id number] DOB:12/07/1950, 72 y.o., male Today's Date: 10/09/2022   PCP: Riki Sheer, DO REFERRING PROVIDER: Alonza Bogus, DO      END OF SESSION:  PT End of Session - 11/24/22 1418     Visit Number 8    Number of Visits 15    Date for PT Re-Evaluation 12/19/22    Authorization Type Medicare/BCBS    Progress Note Due on Visit 17    PT Start Time 1407   pt arrived a few minutes late   PT Stop Time 1442    PT Time Calculation (min) 35 min    Activity Tolerance Patient tolerated treatment well    Behavior During Therapy [facility] for tasks assessed/performed               Past Medical History:  Diagnosis Date   Cervical lymphadenopathy    right - followed by ENT   DOE (dyspnea on exertion) 07/15/2018   Dysphagia, neurologic 07/27/2014   Dystonia 1994   diagnosed in [place]   Enlarged lymph nodes 07/28/2007   Gastroesophageal reflux disease 03/07/2020   Localized swelling of right lower extremity 04/12/2019   Low back pain 11/21/2011   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   Mild neurocognitive disorder due to Parkinson's disease 01/10/2022   Non-Hodgkin lymphoma 2007   diffuse- 6 cycles of chemo with R CHOP Rituxan - last dose 10/08   Nonspecific elevation of levels of transaminase or lactic acid dehydrogenase (LDH) 07/06/2008   Parkinson's disease 07/27/2014   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/02/2015   Past Surgical History:  Procedure Laterality Date   CHOLECYSTECTOMY, LAPAROSCOPIC     EYE SURGERY     x2 as child   PORT-A-CATH REMOVAL     PORTACATH PLACEMENT     SPLENECTOMY     TONSILLECTOMY     Patient Active Problem List   Diagnosis Date Noted   Mild neurocognitive disorder due to Parkinson's disease 01/10/2022   Gastroesophageal reflux disease 03/07/2020   Localized swelling of right lower extremity 04/12/2019   DOE (dyspnea on exertion)  07/15/2018   10 year risk of MI or stroke 7.5% or greater 04/12/2018   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/2015   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   Dysphagia, neurologic 07/27/2014   Parkinson's disease 07/27/2014   Dystonia 07/10/2014   Low back pain 11/21/2011   Nonspecific elevation of levels of transaminase or lactic acid dehydrogenase (LDH) 07/06/2008   Enlarged lymph nodes 07/28/2007   Non-Hodgkin lymphoma 03/23/2007    ONSET DATE: 09/08/2022 (MD referral)  REFERRING DIAG: G20.A1 (ICD-10-CM) - Parkinson's disease without dyskinesia or fluctuating manifestations   THERAPY DIAG:  Unsteadiness on feet  Other abnormalities of gait and mobility  Abnormal posture  Other symptoms and signs involving the nervous system  Rationale for Evaluation and Treatment: Rehabilitation  SUBJECTIVE:  SUBJECTIVE STATEMENT:   I have a pain in my neck that started this morning when I woke up, nothing else really new. Still having trouble with freezing episodes.    Pt accompanied by: self  PERTINENT HISTORY: See PMH above  PAIN:  Are you having pain? 0/10 but does have up to 6/10 pain in neck when turning head   PRECAUTIONS: Fall  WEIGHT BEARING RESTRICTIONS: No  FALLS: Has patient fallen in last 6 months? Yes. Number of falls 1  LIVING ENVIRONMENT: Lives with: lives with their family and lives with their spouse Lives in: House/apartment Stairs:  full flight to 2nd floor with bedroom and additional flight to 3rd floor man cave Has following equipment at home: Single point cane and Walker - 2 wheeled  PLOF: Independent  Not doing exercises and has not been attending PWR! Moves class  PATIENT GOALS: Getting more strength back  OBJECTIVE:   TODAY'S TREATMENT  11/24/22  TherEx  Nustep L5x6 minutes around 70SPM BUEs/BLEs  Farmers carry 8# 136f x2 with UE swing  Opposite UE/LE flexion marches progressing to walks min guard cues for form and amplitude of movement Obstacle course: stepping over 6 inch hurdles + steps+ side stepping with randomized direction changes and S curves through cones min guard/Mod cues to help with freezing episodes  Randomized forward/backing walking with randomized directional and speed changes      PATIENT EDUCATION: Education details: Reviewed turning techniques, PWR! Moves in standing at doorway (see instructions section of Flow sheet) Person educated: Patient Education method: Explanation, Demonstration, and Verbal cues Education comprehension: verbalized understanding, returned demonstration, and needs further education   Access Code: [email or web address]: [email or web address] Date: 11/20/2022 Prepared by: [facility]  Exercises - Seated Cervical Retraction  - 1 x daily - 7 x weekly - 5-10 reps - 3 sec hold - Supine Passive Cervical Retraction  - 1 x daily - 7 x weekly - 10 reps - 3 sec hold - Sit to Stand Without Arm Support  - 1 x daily - 7 x weekly - 5 sets - 5 reps - Scapular Retraction with Resistance  - 1 x daily - 5 x weekly - 2-3 sets - 10 reps - Doorway Pec Stretch at 60 Elevation  - 1 x daily - 5 x weekly - 1 sets - 3 reps - 15 sec hold - Forward Step Up  - 1 x daily - 5 x weekly - 2 sets - 10 reps - Standing Weight Shift  - 1 x daily - 7 x weekly - 1-2 sets - 5 reps - Marching forward  - 1 x daily - 7 x weekly - 1 sets - 4 reps  *PWR! Moves standing x 10 reps, daily, at doorframe for reference.  Perform forward and backward stepping at doorway  -------------------------------------------------------------- Objective measures below taken at initial evaluation:  DIAGNOSTIC FINDINGS: NA  COGNITION: Overall cognitive status: Within functional limits  for tasks assessed.  Per neurocognitive testing in 2023:  [facility] *Lower voice volume noted in eval conversations*  SENSATION: NT  POSTURE: rounded shoulders, forward head, and posterior pelvic tilt  Leans to right in unsupported sitting.  LOWER EXTREMITY ROM:   WFL A/ROM, seated position; P/ROM notes tightness bilateral hamstrings   LOWER EXTREMITY MMT:    MMT Right Eval Left Eval  Hip flexion 4 4+  Hip extension    Hip abduction    Hip adduction    Hip internal rotation  Hip external rotation    Knee flexion 4 4  Knee extension 4 4  Ankle dorsiflexion 3+ 3+  Ankle plantarflexion    Ankle inversion    Ankle eversion    (Blank rows = not tested)    TRANSFERS: Assistive device utilized: None  Sit to stand: CGA Stand to sit: CGA  STAIRS: Level of Assistance: Modified independence Stair Negotiation Technique: Alternating Pattern  with Bilateral Rails Number of Stairs: 3  Height of Stairs: 4"-6"  Comments: Pt does report that at home he has to change from step through to step-to pattern about half way up the steps, due to leg weakness.  GAIT: Gait pattern: step through pattern, shuffling, festinating, poor foot clearance- Right, and poor foot clearance- Left Distance walked: 50 ft x 4 Assistive device utilized: None Level of assistance: SBA Comments: Festinating noted at doorways and with turns to change direction  FUNCTIONAL TESTS:  5 times sit to stand: 10.87 sec, arms crossed at chest, retropulsion  Timed up and go (TUG): 11.97 sec 10 meter walk test: 11.68 sec = 2.81 ft/sec TUG cognitive:  12.03 sec TUG manual:  11.78 sec        GOALS: Goals reviewed with patient? Yes  SHORT TERM GOALS: Target date: 11/07/2022  Pt will be independent with HEP for improved balance, gait, functional strength. Baseline:  Has previously demonstrated Goal status: GOAL MET, 11/06/2022  2.  Pt will verbalize tips to reduce freezing/festination with gait, doorways, and  turns.  Baseline:  Goal status: GOAL MET, 11/06/2022  3.  Pt will negotiate full flight of steps with handrail with step through pattern, to demonstrate improved functional strength. Baseline: reports half step-through and half step-to pattern; per report 11/06/2022, full stair flight Goal status: GOAL MET, 11/06/2022  LONG TERM GOALS: Target date: 11/21/2022>UPDATED to 12/19/2022  Pt will be independent with HEP for improved strength, balance, gait. Baseline: continueing to progress HEP Goal status: IN PROGRESS, 11/20/2022  2.  Pt will improve MiniBESTest score to at least 24/28 to decrease fall risk. Baseline:  22/28 Goal status: IN PROGRESS, 11/20/2022  3.  Pt will perform 5x sit<>stand, 2 of 3 trials with no retropulsion, to demonstrate improved transfer efficiency and functional lower extremity strength. Baseline: Performed 2 trials with pt having retropulsion 1 trial Goal status: IN PROGRESS, 11/20/2022  4.  Pt will report 50% improvement in sit<>stand from low surfaces in home. Baseline:  Goal status: IN PROGRESS, 11/20/2022  ASSESSMENT:  CLINICAL IMPRESSION:  Thaddeaus arrives today doing well, did arrive a few minutes late. We warmed up on the Nustep then continued focus on large amplitude movements as well as functional strengthening and balance training. Also discussed strategies to overcome freezing PRN. Did have quite  a bit of freezing with sudden direction changes today requiring Mod cues to unfreeze effectively.  Did have one other episode of freezing when trying walk forward from stair taps. Will continue efforts.   OBJECTIVE IMPAIRMENTS: Abnormal gait, decreased balance, decreased knowledge of use of DME, decreased mobility, difficulty walking, decreased strength, impaired flexibility, and postural dysfunction.   ACTIVITY LIMITATIONS: standing, stairs, transfers, locomotion level, and caring for others  PARTICIPATION LIMITATIONS: community activity and hobbies  PERSONAL  FACTORS: 3+ comorbidities: PMH above  are also affecting patient's functional outcome.   REHAB POTENTIAL: Good  CLINICAL DECISION MAKING: Evolving/moderate complexity  EVALUATION COMPLEXITY: Moderate  PLAN:  PT FREQUENCY: 2x/week  PT DURATION: 4 weeks per recert [id number] visit  PLANNED INTERVENTIONS: Therapeutic exercises, Therapeutic activity,  Neuromuscular re-education, Balance training, Gait training, Patient/Family education, Self Care, Stair training, and DME instructions  PLAN FOR NEXT SESSION: progress as tolerated; continue to work on addressing freezing    Deniece Ree PT DPT [place] [facility] [address] [place], [place] [address] Phone # [phone] Fax # [phone]

## 2022-11-27 ENCOUNTER — Ambulatory Visit: Payer: Medicare Other | Admitting: Physical Therapy

## 2022-11-27 ENCOUNTER — Ambulatory Visit: Payer: Medicare Other

## 2022-11-27 ENCOUNTER — Encounter: Payer: Self-pay | Admitting: Physical Therapy

## 2022-11-27 DIAGNOSIS — R41841 Cognitive communication deficit: Secondary | ICD-10-CM | POA: Diagnosis not present

## 2022-11-27 DIAGNOSIS — R2689 Other abnormalities of gait and mobility: Secondary | ICD-10-CM | POA: Diagnosis not present

## 2022-11-27 DIAGNOSIS — R2681 Unsteadiness on feet: Secondary | ICD-10-CM | POA: Diagnosis not present

## 2022-11-27 DIAGNOSIS — R471 Dysarthria and anarthria: Secondary | ICD-10-CM

## 2022-11-27 DIAGNOSIS — R29818 Other symptoms and signs involving the nervous system: Secondary | ICD-10-CM | POA: Diagnosis not present

## 2022-11-27 DIAGNOSIS — R293 Abnormal posture: Secondary | ICD-10-CM | POA: Diagnosis not present

## 2022-11-27 NOTE — Therapy (Addendum)
OUTPATIENT SPEECH LANGUAGE PATHOLOGY PARKINSON'S TREATMENT/Progress note   Patient Name: [name] [name] MRN: [id number] DOB:1951/04/16, 72 y.o., male Today's Date: 11/27/2022  PCP: Dr. Deloris Ping REFERRING PROVIDER: Alonza Bogus, DO   End of Session - 11/27/22 1522     Visit Number 10    Number of Visits 17    Date for SLP Re-Evaluation 12/18/22    SLP Start Time 1453    SLP Stop Time  1530    SLP Time Calculation (min) 37 min    Activity Tolerance Patient tolerated treatment well                   Past Medical History:  Diagnosis Date   Cervical lymphadenopathy    right - followed by ENT   DOE (dyspnea on exertion) 07/15/2018   Dysphagia, neurologic 07/27/2014   Dystonia 1994   diagnosed in [place]   Enlarged lymph nodes 07/28/2007   Gastroesophageal reflux disease 03/07/2020   Localized swelling of right lower extremity 04/12/2019   Low back pain 11/21/2011   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   Mild neurocognitive disorder due to Parkinson's disease 01/10/2022   Non-Hodgkin lymphoma 2007   diffuse- 6 cycles of chemo with R CHOP Rituxan - last dose 10/08   Nonspecific elevation of levels of transaminase or lactic acid dehydrogenase (LDH) 07/06/2008   Parkinson's disease 07/27/2014   Post-splenectomy 04/02/2015   Past Surgical History:  Procedure Laterality Date   CHOLECYSTECTOMY, LAPAROSCOPIC     EYE SURGERY     x2 as child   PORT-A-CATH REMOVAL     PORTACATH PLACEMENT     SPLENECTOMY     TONSILLECTOMY     Patient Active Problem List   Diagnosis Date Noted   Mild neurocognitive disorder due to Parkinson's disease 01/10/2022   Gastroesophageal reflux disease 03/07/2020   Localized swelling of right lower extremity 04/12/2019   DOE (dyspnea on exertion) 07/15/2018   10 year risk of MI or stroke 7.5% or greater 04/12/2018   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  Goal Update: See below.  Plan: See below.  Reason Skilled Services are Required: Pt will cont to be seen as he has not yet maximized rehab potential.   ONSET DATE: Dx in mid-2010's  REFERRING DIAG: G20 - Parkinson's disease   THERAPY DIAG:  Dysarthria and anarthria  Cognitive communication deficit  Rationale for Evaluation and Treatment Rehabilitation  SUBJECTIVE:   SUBJECTIVE STATEMENT: "My grandson is here today."  Pt accompanied by: self  PAIN:  Are you having pain? Yes: NPRS scale: 1/10 Pain location: neck Pain description: soreness  PATIENT GOALS: "I want to get back to where I was."  OBJECTIVE:   DIAGNOSTIC FINDINGS: From neuropsych note from April 2023: "Mr. Jeff completed a comprehensive neuropsychological evaluation on 01/10/2022. Please refer to that encounter for the full report and recommendations. Briefly, results suggested isolated impaired performances across complex attention and semantic fluency. Performance variability was also exhibited across all aspects of learning and memory.  Regarding etiology, the most likely culprit for ongoing cognitive weakness remains his past history of Parkinson's disease. While processing speed was improved relative to what might be expected, dysfunction surrounding complex attention and encoding/retrieval deficits of memory are quite common in this illness. The fact that motor/gait dysfunction was noted years prior to concern surrounding cognitive  decline is also a typical timeline associated with Parkinson's disease. Mild anxiety and sleep dysfunction could further influence cognitive performances. Research surrounding cognitive side effects of pramipexole is mixed; however, I cannot rule out an ongoing side effect contribution as well."   PATIENT REPORTED OUTCOME MEASURES (PROM): Communication Effectiveness Survey: Pt scored 18/32 on this survey (pt scored 29/32 at discharge in August 2023).   Patient does not report any communication on the survey as 4 ("very effective"). He reports conversing with a familiar person over the telephone, being part of a conversation in a noisy environment, and speaking to a friend when he is emotionally upset as 3 ("mostly effective"). Scoring 2, or "Somewhat effective" was having a conversation with the family member or friends at home, participating in conversation with strangers in a quiet place, conversing with a stranger over the telephone, and having a conversation while traveling in a car. He rated 1 ("not at all effective") having a conversation with someone at a distance, like across the room.  TODAY'S TREATMENT:  11/27/22: Loud /a/ measurements average low-mid 90s dB, everyday sentences were measured at average mid 80sdB. In conversation segments pt maintained loudness in low 70s for first 8 minutes, faded into upper 60s dB for remainder with occasional min A for loudness.  11/24/22: "It's neat I can feel my diaphragm kick in with the /a/'s." SLP reinforced taht is what pt needs to feel with talking as well. "Oh, I know" pt stated.  Loud /a/ measurements average mid 90s dB, everyday sentences were measured at average low-mid 80sdB. SLP and pt talked in conversation for 5-7 minute segments and pt maintained upper 60s dB with occasional min A.   2/2/924: SLP educated pt about memory strategies for people's names (WARM); pt used an example of a current name he couldn't recall and when he did he wrote it downa  nd repeated it to himself 5x, upon direction from SLP.  Loud /a/ measurements average mid 90s dB, everyday sentences were measured at average low-mid 80sdB. Conversational segments of 5 minute intervals resulted in pt WNL loudness for first 3 segments and after this, pt req'd more intense and more frequent cueing for the next 3 segments. Pt self-corrected once in the first segment and once in the second segment but did not self-correct after that.  11/12/22: Loud /a/ measurements average low-mid 90s dB, everyday sentences were measured at average low-mid 80s dB. Pt's conversation was suboptimal (mid-upper 60s) mostly due to loudness decay.In 3-4 minute conversational segments pt req'd occasional mod cues for loudness. Pt with throat clearing frequently until SLP educated pt on ramifications of throat clearing. He drank caffeine free diet drink instead for 4/5 opportunities.   11/03/22: Loud /a/ measurements average low-mid 90s dB, everyday sentences were measured at average low-mid 80s dB. Pt and SLP talked in 3 minute conversational segments in mod complex conversation with pt having upper 60s dB.  10/29/22: Loud /a/ measurements average mid 90s dB, everyday sentences were measured at average mid-80s dB. In sentence tasks pt req'd rare min A for loudness decay. Pt and SLP talked in mod complex conversation with pt having upper 60s dB for 12  minutes.  10/27/22: Loud /a/ measurements average mid 90s dB, everyday sentences were at mid-80s dB. In sentence tasks pt req'd occasional min-mod A for loudness decay faded to rare min -mod A for loudness decay. SLP encouraged pt to focus on his effort with loud /a/. In 2-3 minutes conversation segments pt req'd occasional mod A for maintaining loudness.   PATIENT EDUCATION: Education details: see above in "today's treatment" Person educated: Patient Education method: Explanation, Demonstration, and Verbal cues Education comprehension: verbalized understanding and  return demonstrated   HOME EXERCISE PROGRAM: Loud /a/ x6 BID, and everyday sentences.   GOALS: Goals reviewed with patient? Yes  SHORT TERM GOALS: Target date: 11/13/22    pt will produce loud /a/ with at least low 90s dB average over three sessions Baseline: 10/27/22, 10/29/22 Goal status: Met  2.  Pt will produce 16/20 sentence responses with 70dB over two sessions Baseline: 10/29/22 Goal status: Met  3.  Pt will produce 5 minutes simple conversation with volume upper 60s dB average over three sessions Baseline:  Goal status: Not met  4.  pt will generate abdominal breathing 80% of the time when engaging in 5 minutes simple conversation in 2 sessions Baseline:  Goal status: partially met    LONG TERM GOALS: Target date: 12/18/22    Pt will score higher than 18/32 on CES in the final two ST sessions Baseline:  Goal status: Ongoing  2.  Pt will report successful usage of strategies for recall of people's names in 2 sessions Baseline:  Goal status: Ongoing  3.  Pt will maintain average of upper 60s dB over 10 minute simple-mod complex conversation with rare min A in three sessions  Baseline:  Goal status: Ongoing  4.  pt will use abdominal breathing 75% of the time in 10 minutes simple-mod complex conversation in three sessions  Baseline: 11/27/22 Goal status: Ongoing   ASSESSMENT:  CLINICAL IMPRESSION: Patient is a 72 y.o. male who was seen today for treatment of speech clarity who demonstrated mod dysarthria and reduced breath support due to Parkinson's disease. See tx note from today. Pt would cont to benefit from skilled ST targeting loudness in conversation, and strategies for recalling people's names.  OBJECTIVE IMPAIRMENTS  Objective impairments include memory and dysarthria. These impairments are limiting patient from managing medications, household responsibilities, and effectively communicating at home and in community.Factors affecting potential to achieve goals  and functional outcome are previous level of function and severity of impairments.. Patient will benefit from skilled SLP services to address above impairments and improve overall function.  REHAB POTENTIAL: Good  PLAN: SLP FREQUENCY: 2x/week  SLP DURATION: 8 weeks  PLANNED INTERVENTIONS: Environmental controls, Cueing hierachy, Internal/external aids, Functional tasks, Multimodal communication approach, SLP instruction and feedback, and Compensatory strategies    Ismael Karge, CCC-SLP 11/27/2022, 3:23 PM

## 2022-11-27 NOTE — Therapy (Signed)
OUTPATIENT PHYSICAL THERAPY NEURO TREATMENT   Patient Name: [name] [name] MRN: [id number] DOB:1951/01/26, 72 y.o., male Today's Date: 11/27/2022   PCP: Riki Sheer, DO REFERRING PROVIDER: Alonza Bogus, DO      END OF SESSION:  PT End of Session - 11/27/22 1356     Visit Number 9    Number of Visits 15    Date for PT Re-Evaluation 12/19/22    Authorization Type Medicare/BCBS    Progress Note Due on Visit 17    PT Start Time [id number]    PT Stop Time 1446    PT Time Calculation (min) 44 min    Activity Tolerance Patient tolerated treatment well    Behavior During Therapy [facility] for tasks assessed/performed               Past Medical History:  Diagnosis Date   Cervical lymphadenopathy    right - followed by ENT   DOE (dyspnea on exertion) 07/15/2018   Dysphagia, neurologic 07/27/2014   Dystonia 1994   diagnosed in [place]   Enlarged lymph nodes 07/28/2007   Gastroesophageal reflux disease 03/07/2020   Localized swelling of right lower extremity 04/12/2019   Low back pain 11/21/2011   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   Mild neurocognitive disorder due to Parkinson's disease 01/10/2022   Non-Hodgkin lymphoma 2007   diffuse- 6 cycles of chemo with R CHOP Rituxan - last dose 10/08   Nonspecific elevation of levels of transaminase or lactic acid dehydrogenase (LDH) 07/06/2008   Parkinson's disease 07/27/2014   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   Past Surgical History:  Procedure Laterality Date   CHOLECYSTECTOMY, LAPAROSCOPIC     EYE SURGERY     x2 as child   PORT-A-CATH REMOVAL     PORTACATH PLACEMENT     SPLENECTOMY     TONSILLECTOMY     Patient Active Problem List   Diagnosis Date Noted   Mild neurocognitive disorder due to Parkinson's disease 01/10/2022   Gastroesophageal reflux disease 03/07/2020   Localized swelling of right lower extremity 04/12/2019   DOE (dyspnea on exertion) 07/15/2018   10 year risk of MI or  stroke 7.5% or greater 04/12/2018   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   Dysphagia, neurologic 07/27/2014   Parkinson's disease 07/27/2014   Dystonia 07/10/2014   Low back pain 11/21/2011   Nonspecific elevation of levels of transaminase or lactic acid dehydrogenase (LDH) 07/06/2008   Enlarged lymph nodes 07/28/2007   Non-Hodgkin lymphoma 03/23/2007    ONSET DATE: 09/08/2022 (MD referral)  REFERRING DIAG: G20.A1 (ICD-10-CM) - Parkinson's disease without dyskinesia or fluctuating manifestations   THERAPY DIAG:  Unsteadiness on feet  Other abnormalities of gait and mobility  Abnormal posture  Other symptoms and signs involving the nervous system  Rationale for Evaluation and Treatment: Rehabilitation  SUBJECTIVE:  SUBJECTIVE STATEMENT: Neck much better.  Nothing much new.    I have a pain in my neck that started this morning when I woke up, nothing else really new. Still having trouble with freezing episodes.    Pt accompanied by: self  PERTINENT HISTORY: See PMH above  PAIN:  Are you having pain? 0/10   PRECAUTIONS: Fall  WEIGHT BEARING RESTRICTIONS: No  FALLS: Has patient fallen in last 6 months? Yes. Number of falls 1  LIVING ENVIRONMENT: Lives with: lives with their family and lives with their spouse Lives in: House/apartment Stairs:  full flight to 2nd floor with bedroom and additional flight to 3rd floor man cave Has following equipment at home: Single point cane and Walker - 2 wheeled  PLOF: Independent  Not doing exercises and has not been attending PWR! Moves class  PATIENT GOALS: Getting more strength back  OBJECTIVE:    TODAY'S TREATMENT: 11/27/2022 Activity Comments  Four square step activity, 5 reps  stepping over hurdles Min assist/min guard and cues for wider stepping  Obstacle negotiation stepping around hurdles, then around hula hoops, with added conversation tasks Smaller steps with added conversation task  Stepping over hula hoops and hurdles, forward and side directions, the progression to forward gait and turns with wide BOS Cues for increased step length to lessen number of steps, mild freezing with quick stop/starts  Marching in place, 3 sets x 10 with tactile cues for increased leg lifts Hurdle between feet for wide BOS         For warm-up at beginning of session:  Pt performs PWR! Moves in standing position x 10-20 reps.  Performed at doorway for cue for posture, large amplitude movement   PWR! Up for improved posture  PWR! Rock for improved weighshifting  PWR! Twist for improved trunk rotation   PWR! Step for improved step initiation (forward through doorway)  Cues provided for technique and intensity    PATIENT EDUCATION: Education details: Reviewed turning techniques, Reviewed PWR! Moves in standing at doorway (see instructions section of Flow sheet); increased foot clearance/step length with obstacle practice to carryover for decreased freezing episodes Person educated: Patient Education method: Explanation, Demonstration, and Verbal cues Education comprehension: verbalized understanding, returned demonstration, and needs further education   Access Code: [email or web address] URL: [email or web address] Date: 11/20/2022 Prepared by: [facility]  Exercises - Seated Cervical Retraction  - 1 x daily - 7 x weekly - 5-10 reps - 3 sec hold - Supine Passive Cervical Retraction  - 1 x daily - 7 x weekly - 10 reps - 3 sec hold - Sit to Stand Without Arm Support  - 1 x daily - 7 x weekly - 5 sets - 5 reps - Scapular Retraction with Resistance  - 1 x daily - 5 x weekly - 2-3 sets - 10 reps - Doorway Pec Stretch at 60 Elevation  - 1 x  daily - 5 x weekly - 1 sets - 3 reps - 15 sec hold - Forward Step Up  - 1 x daily - 5 x weekly - 2 sets - 10 reps - Standing Weight Shift  - 1 x daily - 7 x weekly - 1-2 sets - 5 reps - Marching forward  - 1 x daily - 7 x weekly - 1 sets - 4 reps  *PWR! Moves standing x 10 reps, daily, at doorframe for reference.  Perform forward and backward stepping at doorway  -------------------------------------------------------------- Objective measures below taken  at initial evaluation:  DIAGNOSTIC FINDINGS: NA  COGNITION: Overall cognitive status: Within functional limits for tasks assessed.  Per neurocognitive testing in 2023:  [facility] *Lower voice volume noted in eval conversations*  SENSATION: NT  POSTURE: rounded shoulders, forward head, and posterior pelvic tilt  Leans to right in unsupported sitting.  LOWER EXTREMITY ROM:   WFL A/ROM, seated position; P/ROM notes tightness bilateral hamstrings   LOWER EXTREMITY MMT:    MMT Right Eval Left Eval  Hip flexion 4 4+  Hip extension    Hip abduction    Hip adduction    Hip internal rotation    Hip external rotation    Knee flexion 4 4  Knee extension 4 4  Ankle dorsiflexion 3+ 3+  Ankle plantarflexion    Ankle inversion    Ankle eversion    (Blank rows = not tested)    TRANSFERS: Assistive device utilized: None  Sit to stand: CGA Stand to sit: CGA  STAIRS: Level of Assistance: Modified independence Stair Negotiation Technique: Alternating Pattern  with Bilateral Rails Number of Stairs: 3  Height of Stairs: 4"-6"  Comments: Pt does report that at home he has to change from step through to step-to pattern about half way up the steps, due to leg weakness.  GAIT: Gait pattern: step through pattern, shuffling, festinating, poor foot clearance- Right, and poor foot clearance- Left Distance walked: 50 ft x 4 Assistive device utilized: None Level of assistance: SBA Comments: Festinating noted at doorways and with turns to  change direction  FUNCTIONAL TESTS:  5 times sit to stand: 10.87 sec, arms crossed at chest, retropulsion  Timed up and go (TUG): 11.97 sec 10 meter walk test: 11.68 sec = 2.81 ft/sec TUG cognitive:  12.03 sec TUG manual:  11.78 sec        GOALS: Goals reviewed with patient? Yes  SHORT TERM GOALS: Target date: 11/07/2022  Pt will be independent with HEP for improved balance, gait, functional strength. Baseline:  Has previously demonstrated Goal status: GOAL MET, 11/06/2022  2.  Pt will verbalize tips to reduce freezing/festination with gait, doorways, and turns.  Baseline:  Goal status: GOAL MET, 11/06/2022  3.  Pt will negotiate full flight of steps with handrail with step through pattern, to demonstrate improved functional strength. Baseline: reports half step-through and half step-to pattern; per report 11/06/2022, full stair flight Goal status: GOAL MET, 11/06/2022  LONG TERM GOALS: Target date: 11/21/2022>UPDATED to 12/19/2022  Pt will be independent with HEP for improved strength, balance, gait. Baseline: continueing to progress HEP Goal status: IN PROGRESS, 11/20/2022  2.  Pt will improve MiniBESTest score to at least 24/28 to decrease fall risk. Baseline:  22/28 Goal status: IN PROGRESS, 11/20/2022  3.  Pt will perform 5x sit<>stand, 2 of 3 trials with no retropulsion, to demonstrate improved transfer efficiency and functional lower extremity strength. Baseline: Performed 2 trials with pt having retropulsion 1 trial Goal status: IN PROGRESS, 11/20/2022  4.  Pt will report 50% improvement in sit<>stand from low surfaces in home. Baseline:  Goal status: IN PROGRESS, 11/20/2022  ASSESSMENT:  CLINICAL IMPRESSION: Continued to focus skilled PT session today on larger amplitude movements, with PWR! Moves standing for warm-up, then with obstacle negotiation-varied heights, varied directions, over/around obstacles, with conversation tasks, and with planned/unplanned turns.   Pt does well with obstacles with larger step length and foot clearance, with conversation tasks adding to dual task difficulty with slightly smaller steps.  Several episodes noted of festination with  stopping between activities, but pt has good strategies to pause, get taller posture and start with big steps.  He will continue to benefit from skilled PT towards LTGs.  OBJECTIVE IMPAIRMENTS: Abnormal gait, decreased balance, decreased knowledge of use of DME, decreased mobility, difficulty walking, decreased strength, impaired flexibility, and postural dysfunction.   ACTIVITY LIMITATIONS: standing, stairs, transfers, locomotion level, and caring for others  PARTICIPATION LIMITATIONS: community activity and hobbies  PERSONAL FACTORS: 3+ comorbidities: PMH above  are also affecting patient's functional outcome.   REHAB POTENTIAL: Good  CLINICAL DECISION MAKING: Evolving/moderate complexity  EVALUATION COMPLEXITY: Moderate  PLAN:  PT FREQUENCY: 2x/week  PT DURATION: 4 weeks per recert [id number] visit  PLANNED INTERVENTIONS: Therapeutic exercises, Therapeutic activity, Neuromuscular re-education, Balance training, Gait training, Patient/Family education, Self Care, Stair training, and DME instructions  PLAN FOR NEXT SESSION: Continue activities continue to work on addressing freezing of gait and turning; low surface sit<>stand and floor>stand transfer practice   Mady Haagensen, PT 11/27/22 3:37 PM Phone: [phone] Fax: [phone]   [facility] [address], [place], [place] [address] Phone # [phone] Fax # [phone]

## 2022-12-01 ENCOUNTER — Ambulatory Visit: Payer: Medicare Other

## 2022-12-01 ENCOUNTER — Ambulatory Visit: Payer: Medicare Other | Admitting: Physical Therapy

## 2022-12-01 ENCOUNTER — Encounter: Payer: Self-pay | Admitting: Physical Therapy

## 2022-12-01 DIAGNOSIS — R2681 Unsteadiness on feet: Secondary | ICD-10-CM | POA: Diagnosis not present

## 2022-12-01 DIAGNOSIS — R29818 Other symptoms and signs involving the nervous system: Secondary | ICD-10-CM | POA: Diagnosis not present

## 2022-12-01 DIAGNOSIS — R471 Dysarthria and anarthria: Secondary | ICD-10-CM

## 2022-12-01 DIAGNOSIS — R41841 Cognitive communication deficit: Secondary | ICD-10-CM | POA: Diagnosis not present

## 2022-12-01 DIAGNOSIS — R293 Abnormal posture: Secondary | ICD-10-CM

## 2022-12-01 DIAGNOSIS — R2689 Other abnormalities of gait and mobility: Secondary | ICD-10-CM

## 2022-12-01 NOTE — Patient Instructions (Signed)
Scooting up to the table:  1) Move the chair back from the table  2)  Sit down.  Scoot to the edge of the chair  3)  Lean forward (Nose over your toes) and lift your bottom, move chair forward.  4)  Sit back down in back of seat.  REPEAT steps 2-4 until you are in place at the table.  To go from the table:  1)  Scoot to the edge of the chair  2)  Lean forward and lift your bottom, push chair back.  3)  Sit back down and REPEAT.

## 2022-12-01 NOTE — Therapy (Signed)
OUTPATIENT PHYSICAL THERAPY NEURO TREATMENT   Patient Name: [name] [name] MRN: [id number] DOB:08/07/1951, 72 y.o., male Today's Date: 11/27/2022   PCP: [name] Sheer, DO REFERRING PROVIDER: Alonza Bogus, DO      END OF SESSION:  PT End of Session - 12/01/22 1407     Visit Number 10    Number of Visits 15    Date for PT Re-Evaluation 12/19/22    Authorization Type Medicare/BCBS    Progress Note Due on Visit 17   (PN was done at visit 7)   PT Start Time 1403    PT Stop Time 1447    PT Time Calculation (min) 44 min    Equipment Utilized During Treatment Gait belt    Activity Tolerance Patient tolerated treatment well    Behavior During Therapy WFL for tasks assessed/performed               Past Medical History:  Diagnosis Date   Cervical lymphadenopathy    right - followed by ENT   DOE (dyspnea on exertion) 07/15/2018   Dysphagia, neurologic 07/27/2014   Dystonia 1994   diagnosed in [place]   Enlarged lymph nodes 07/28/2007   Gastroesophageal reflux disease 03/07/2020   Localized swelling of right lower extremity 04/12/2019   Low back pain 11/21/2011   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   Mild neurocognitive disorder due to Parkinson's disease 01/10/2022   Non-Hodgkin lymphoma 2007   diffuse- 6 cycles of chemo with R CHOP Rituxan - last dose 10/08   Nonspecific elevation of levels of transaminase or lactic acid dehydrogenase (LDH) 07/06/2008   Parkinson's disease 07/27/2014   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/2015   Past Surgical History:  Procedure Laterality Date   CHOLECYSTECTOMY, LAPAROSCOPIC     EYE SURGERY     x2 as child   PORT-A-CATH REMOVAL     PORTACATH PLACEMENT     SPLENECTOMY     TONSILLECTOMY     Patient Active Problem List   Diagnosis Date Noted   Mild neurocognitive disorder due to Parkinson's disease 01/10/2022   Gastroesophageal reflux disease 03/07/2020   Localized swelling of right lower extremity  04/12/2019   DOE (dyspnea on exertion) 07/15/2018   10 year risk of MI or stroke 7.5% or greater 04/12/2018   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   Dysphagia, neurologic 07/27/2014   Parkinson's disease 07/27/2014   Dystonia 07/10/2014   Low back pain 11/21/2011   Nonspecific elevation of levels of transaminase or lactic acid dehydrogenase (LDH) 07/06/2008   Enlarged lymph nodes 07/28/2007   Non-Hodgkin lymphoma 03/23/2007    ONSET DATE: 09/08/2022 (MD referral)  REFERRING DIAG: G20.A1 (ICD-10-CM) - Parkinson's disease without dyskinesia or fluctuating manifestations   THERAPY DIAG:  Unsteadiness on feet  Abnormal posture  Other abnormalities of gait and mobility  Other symptoms and signs involving the nervous system  Rationale for Evaluation and Treatment: Rehabilitation  SUBJECTIVE:  SUBJECTIVE STATEMENT: Been working on my exercise routine, and sometimes the weekends are busier, resulting in not getting to do my exercises Pt accompanied by: self  PERTINENT HISTORY: See PMH above  PAIN:  Are you having pain? 0/10   PRECAUTIONS: Fall  WEIGHT BEARING RESTRICTIONS: No  FALLS: Has patient fallen in last 6 months? Yes. Number of falls 1  LIVING ENVIRONMENT: Lives with: lives with their family and lives with their spouse Lives in: House/apartment Stairs:  full flight to 2nd floor with bedroom and additional flight to 3rd floor man cave Has following equipment at home: Single point cane and Walker - 2 wheeled  PLOF: Independent  Not doing exercises and has not been attending PWR! Moves class  PATIENT GOALS: Getting more strength back  OBJECTIVE:   Pt verbalizes wanting to try to work on scooting chairs to and from  table for improved ease of this, due to having difficulty. *Discussed with HEP, trying to fit some of these exercises at other times of the day, in case he's not able to do all of them in the early part of the day.  TODAY'S TREATMENT: 12/01/2022 Activity Comments  Sit<>stand from mat surface, 10 reps Cues for "light as a feather" for eccentric control  Sit<>stand from lower (17" mat) surface, with scooting forward/back practice, 2 x 5 reps Cues for technique  Practiced lateral scooting and forward/back scooting Pt prefers forward/back scoot, lifting bottom  Practiced at table-forward/back scooting with 1)forward scoot to edge of chair 2) nose over toes forward lean to lift bottom from chair 3)pull chair forward or push chair back and repeat as needed Pt pleased with this technique and demo good understanding  [name] [name] in standing-lateral and stagger stance positions at obstacles, then negotiating stepping on or over obstacles, including turning to go back through obstacles Min assist>min guard, with pt able to figure out more upright posture, pausing and rocking to help with balance versus quickly going through obstacles with forward posture  Monster walk, 3 reps with cues for wide BOS and dynamic SLS      PATIENT EDUCATION: Education details: tips for ease of scooting (sofa/recliner), tips for ease of scooting up and back with chair at table; posture and weightshift for obstacle negotiation Person educated: Patient Education method: Explanation, Demonstration, and Verbal cues Education comprehension: verbalized understanding, returned demonstration, and needs further education   Access Code: [email or web address] URL: [email or web address] Date: 11/20/2022 Prepared by: [facility]  Exercises - Seated Cervical Retraction  - 1 x daily - 7 x weekly - 5-10 reps - 3 sec hold - Supine Passive Cervical Retraction  - 1 x daily - 7 x weekly - 10 reps - 3  sec hold - Sit to Stand Without Arm Support  - 1 x daily - 7 x weekly - 5 sets - 5 reps - Scapular Retraction with Resistance  - 1 x daily - 5 x weekly - 2-3 sets - 10 reps - Doorway Pec Stretch at 60 Elevation  - 1 x daily - 5 x weekly - 1 sets - 3 reps - 15 sec hold - Forward Step Up  - 1 x daily - 5 x weekly - 2 sets - 10 reps - Standing Weight Shift  - 1 x daily - 7 x weekly - 1-2 sets - 5 reps - Marching forward  - 1 x daily - 7 x weekly - 1 sets - 4 reps  *PWR! Moves standing x 10  reps, daily, at doorframe for reference.  Perform forward and backward stepping at doorway  -------------------------------------------------------------- Objective measures below taken at initial evaluation:  DIAGNOSTIC FINDINGS: NA  COGNITION: Overall cognitive status: Within functional limits for tasks assessed.  Per neurocognitive testing in 2023:  [facility] *Lower voice volume noted in eval conversations*  SENSATION: NT  POSTURE: rounded shoulders, forward head, and posterior pelvic tilt  Leans to right in unsupported sitting.  LOWER EXTREMITY ROM:   WFL A/ROM, seated position; P/ROM notes tightness bilateral hamstrings   LOWER EXTREMITY MMT:    MMT Right Eval Left Eval  Hip flexion 4 4+  Hip extension    Hip abduction    Hip adduction    Hip internal rotation    Hip external rotation    Knee flexion 4 4  Knee extension 4 4  Ankle dorsiflexion 3+ 3+  Ankle plantarflexion    Ankle inversion    Ankle eversion    (Blank rows = not tested)    TRANSFERS: Assistive device utilized: None  Sit to stand: CGA Stand to sit: CGA  STAIRS: Level of Assistance: Modified independence Stair Negotiation Technique: Alternating Pattern  with Bilateral Rails Number of Stairs: 3  Height of Stairs: 4"-6"  Comments: Pt does report that at home he has to change from step through to step-to pattern about half way up the steps, due to leg weakness.  GAIT: Gait pattern: step through pattern, shuffling,  festinating, poor foot clearance- Right, and poor foot clearance- Left Distance walked: 50 ft x 4 Assistive device utilized: None Level of assistance: SBA Comments: Festinating noted at doorways and with turns to change direction  FUNCTIONAL TESTS:  5 times sit to stand: 10.87 sec, arms crossed at chest, retropulsion  Timed up and go (TUG): 11.97 sec 10 meter walk test: 11.68 sec = 2.81 ft/sec TUG cognitive:  12.03 sec TUG manual:  11.78 sec        GOALS: Goals reviewed with patient? Yes  SHORT TERM GOALS: Target date: 11/07/2022  Pt will be independent with HEP for improved balance, gait, functional strength. Baseline:  Has previously demonstrated Goal status: GOAL MET, 11/06/2022  2.  Pt will verbalize tips to reduce freezing/festination with gait, doorways, and turns.  Baseline:  Goal status: GOAL MET, 11/06/2022  3.  Pt will negotiate full flight of steps with handrail with step through pattern, to demonstrate improved functional strength. Baseline: reports half step-through and half step-to pattern; per report 11/06/2022, full stair flight Goal status: GOAL MET, 11/06/2022  LONG TERM GOALS: Target date: 11/21/2022>UPDATED to 12/19/2022  Pt will be independent with HEP for improved strength, balance, gait. Baseline: continueing to progress HEP Goal status: IN PROGRESS, 11/20/2022  2.  Pt will improve MiniBESTest score to at least 24/28 to decrease fall risk. Baseline:  22/28 Goal status: IN PROGRESS, 11/20/2022  3.  Pt will perform 5x sit<>stand, 2 of 3 trials with no retropulsion, to demonstrate improved transfer efficiency and functional lower extremity strength. Baseline: Performed 2 trials with pt having retropulsion 1 trial Goal status: IN PROGRESS, 11/20/2022  4.  Pt will report 50% improvement in sit<>stand from low surfaces in home. Baseline:  Goal status: IN PROGRESS, 11/20/2022  ASSESSMENT:  CLINICAL IMPRESSION: Focused on skilled PT session for functional  mobility, per pt's request-sit<>stand and scooting from low, soft surfaces.  Pt has difficulty with lateral weightshifting for scooting, and he does better with forward lean to unweight bottom for scooting.  He is able to translate this  to scooting up and back with chair at table.  Worked also on weigthshifting and obstacle negotiation; most challenging is when obstacles are varied heights, requiring PT assist to regain balance.  He does better with stepping over/around obstacles versus on them, and uses upright posture and weigthshifting to negotiate with optimal balance.  He will continue to benefit from skilled PT towards LTGs for improved functional mobility and decreased fall risk.  OBJECTIVE IMPAIRMENTS: Abnormal gait, decreased balance, decreased knowledge of use of DME, decreased mobility, difficulty walking, decreased strength, impaired flexibility, and postural dysfunction.   ACTIVITY LIMITATIONS: standing, stairs, transfers, locomotion level, and caring for others  PARTICIPATION LIMITATIONS: community activity and hobbies  PERSONAL FACTORS: 3+ comorbidities: PMH above  are also affecting patient's functional outcome.   REHAB POTENTIAL: Good  CLINICAL DECISION MAKING: Evolving/moderate complexity  EVALUATION COMPLEXITY: Moderate  PLAN:  PT FREQUENCY: 2x/week  PT DURATION: 4 weeks per recert [id number] visit  PLANNED INTERVENTIONS: Therapeutic exercises, Therapeutic activity, Neuromuscular re-education, Balance training, Gait training, Patient/Family education, Self Care, Stair training, and DME instructions  PLAN FOR NEXT SESSION: Continue activities continue to work on addressing freezing of gait and turning; low surface sit<>stand and floor>stand transfer practice   Mady Haagensen, PT 12/01/22 3:09 PM Phone: [phone] Fax: [phone]   [facility] [address] [place], [place] [address] Phone # [phone] Fax #  [phone]

## 2022-12-01 NOTE — Therapy (Signed)
OUTPATIENT SPEECH LANGUAGE PATHOLOGY PARKINSON'S TREATMENT   Patient Name: [name] [name] MRN: [id number] DOB:01-Aug-1951, 72 y.o., male Today's Date: 12/01/2022  PCP: Dr. Deloris Ping REFERRING PROVIDER: Alonza Bogus, DO   End of Session - 12/01/22 1457     Visit Number 11    Number of Visits 17    Date for SLP Re-Evaluation 12/18/22    SLP Start Time 1451    SLP Stop Time  1531    SLP Time Calculation (min) 40 min    Activity Tolerance Patient tolerated treatment well                   Past Medical History:  Diagnosis Date   Cervical lymphadenopathy    right - followed by ENT   DOE (dyspnea on exertion) 07/15/2018   Dysphagia, neurologic 07/27/2014   Dystonia 1994   diagnosed in [place]   Enlarged lymph nodes 07/28/2007   Gastroesophageal reflux disease 03/07/2020   Localized swelling of right lower extremity 04/12/2019   Low back pain 11/21/2011   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   Mild neurocognitive disorder due to Parkinson's disease 01/10/2022   Non-Hodgkin lymphoma 2007   diffuse- 6 cycles of chemo with R CHOP Rituxan - last dose 10/08   Nonspecific elevation of levels of transaminase or lactic acid dehydrogenase (LDH) 07/06/2008   Parkinson's disease 07/27/2014   Post-splenectomy 04/02/2015   Past Surgical History:  Procedure Laterality Date   CHOLECYSTECTOMY, LAPAROSCOPIC     EYE SURGERY     x2 as child   PORT-A-CATH REMOVAL     PORTACATH PLACEMENT     SPLENECTOMY     TONSILLECTOMY     Patient Active Problem List   Diagnosis Date Noted   Mild neurocognitive disorder due to Parkinson's disease 01/10/2022   Gastroesophageal reflux disease 03/07/2020   Localized swelling of right lower extremity 04/12/2019   DOE (dyspnea on exertion) 07/15/2018   10 year risk of MI or stroke 7.5% or greater 04/12/2018   Post-splenectomy 04/02/2015   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    Dysphagia, neurologic 07/27/2014   Parkinson's disease 07/27/2014   Dystonia 07/10/2014   Low back pain 11/21/2011   Nonspecific elevation of levels of transaminase or lactic acid dehydrogenase (LDH) 07/06/2008   Enlarged lymph nodes 07/28/2007   Non-Hodgkin lymphoma 03/23/2007    ONSET DATE: Dx in mid-2010's  REFERRING DIAG: G20 - Parkinson's disease   THERAPY DIAG:  Dysarthria and anarthria  Cognitive communication deficit  Rationale for Evaluation and Treatment Rehabilitation  SUBJECTIVE:   SUBJECTIVE STATEMENT: "I didn't get as much sleep this weekend."  Pt accompanied by: self  PAIN:  Are you having pain? No   PATIENT GOALS: "I want to get back to where I was."  OBJECTIVE:   DIAGNOSTIC FINDINGS: From neuropsych note from April 2023: "Mr. Lawrimore completed a comprehensive neuropsychological evaluation on 01/10/2022. Please refer to that encounter for the full report and recommendations. Briefly, results suggested isolated impaired performances across complex attention and semantic fluency. Performance variability was also exhibited across all aspects of learning and memory. Regarding etiology, the most likely culprit for ongoing cognitive weakness remains his past history of Parkinson's disease. While processing speed was improved relative to what might be expected, dysfunction surrounding complex attention and encoding/retrieval deficits of memory are quite common in this illness. The fact that motor/gait dysfunction was noted years prior to concern surrounding cognitive decline is also a typical timeline associated with Parkinson's disease.  Mild anxiety and sleep dysfunction could further influence cognitive performances. Research surrounding cognitive side effects of pramipexole is mixed; however, I cannot rule out an ongoing side effect contribution as well."   PATIENT REPORTED OUTCOME MEASURES (PROM): Communication Effectiveness Survey: Pt scored 18/32 on this survey (pt  scored 29/32 at discharge in August 2023).   Patient does not report any communication on the survey as 4 ("very effective"). He reports conversing with a familiar person over the telephone, being part of a conversation in a noisy environment, and speaking to a friend when he is emotionally upset as 3 ("mostly effective"). Scoring 2, or "Somewhat effective" was having a conversation with the family member or friends at home, participating in conversation with strangers in a quiet place, conversing with a stranger over the telephone, and having a conversation while traveling in a car. He rated 1 ("not at all effective") having a conversation with someone at a distance, like across the room.  TODAY'S TREATMENT:  12/01/22: Pt overheard in PT talking with suboptimal volume. Pt produced loud /a/ with average low-mid 90s dB, everyday sentences were measured at average mid 80sdB with initial cues for louder speech. In 2-3 minutes conversation today pt maintained loudness of mid 60's dB, with SLP cue to perform loud /a/ after 10 minutes. This was successful in pt's incr in loudness to WNL for 4 utterances then decr'd to mid 60s dB. Homework provided for 2-3 sentence responses with WNL volume.   11/27/22: Loud /a/ measurements average low-mid 90s dB, everyday sentences were measured at average mid 80sdB. In conversation segments pt maintained loudness in low 70s for first 8 minutes, faded into upper 60s dB for remainder with occasional min A for loudness.  11/24/22: "It's neat I can feel my diaphragm kick in with the /a/'s." SLP reinforced taht is what pt needs to feel with talking as well. "Oh, I know" pt stated.  Loud /a/ measurements average mid 90s dB, everyday sentences were measured at average low-mid 80sdB. SLP and pt talked in conversation for 5-7 minute segments and pt maintained upper 60s dB with occasional min A.   2/2/924: SLP educated pt about memory strategies for people's names (WARM); pt used an  example of a current name he couldn't recall and when he did he wrote it downa nd repeated it to himself 5x, upon direction from SLP.  Loud /a/ measurements average mid 90s dB, everyday sentences were measured at average low-mid 80sdB. Conversational segments of 5 minute intervals resulted in pt WNL loudness for first 3 segments and after this, pt req'd more intense and more frequent cueing for the next 3 segments. Pt self-corrected once in the first segment and once in the second segment but did not self-correct after that.  11/12/22: Loud /a/ measurements average low-mid 90s dB, everyday sentences were measured at average low-mid 80s dB. Pt's conversation was suboptimal (mid-upper 60s) mostly due to loudness decay.In 3-4 minute conversational segments pt req'd occasional mod cues for loudness. Pt with throat clearing frequently until SLP educated pt on ramifications of throat clearing. He drank caffeine free diet drink instead for 4/5 opportunities.   11/03/22: Loud /a/ measurements average low-mid 90s dB, everyday sentences were measured at average low-mid 80s dB. Pt and SLP talked in 3 minute conversational segments in mod complex conversation with pt having upper 60s dB.  10/29/22: Loud /a/ measurements average mid 90s dB, everyday sentences were measured at average mid-80s dB. In sentence tasks pt req'd rare min A for  loudness decay. Pt and SLP talked in mod complex conversation with pt having upper 60s dB for 12 minutes.  10/27/22: Loud /a/ measurements average mid 90s dB, everyday sentences were at mid-80s dB. In sentence tasks pt req'd occasional min-mod A for loudness decay faded to rare min -mod A for loudness decay. SLP encouraged pt to focus on his effort with loud /a/. In 2-3 minutes conversation segments pt req'd occasional mod A for maintaining loudness.   PATIENT EDUCATION: Education details: see above in "today's treatment" Person educated: Patient Education method: Explanation,  Demonstration, and Verbal cues Education comprehension: verbalized understanding and return demonstrated   HOME EXERCISE PROGRAM: Loud /a/ x6 BID, and everyday sentences.   GOALS: Goals reviewed with patient? Yes  SHORT TERM GOALS: Target date: 11/13/22    pt will produce loud /a/ with at least low 90s dB average over three sessions Baseline: 10/27/22, 10/29/22 Goal status: Met  2.  Pt will produce 16/20 sentence responses with 70dB over two sessions Baseline: 10/29/22 Goal status: Met  3.  Pt will produce 5 minutes simple conversation with volume upper 60s dB average over three sessions Baseline:  Goal status: Not met  4.  pt will generate abdominal breathing 80% of the time when engaging in 5 minutes simple conversation in 2 sessions Baseline:  Goal status: partially met    LONG TERM GOALS: Target date: 12/18/22    Pt will score higher than 18/32 on CES in the final two ST sessions Baseline:  Goal status: Ongoing  2.  Pt will report successful usage of strategies for recall of people's names in 2 sessions Baseline:  Goal status: Ongoing  3.  Pt will maintain average of upper 60s dB over 10 minute simple-mod complex conversation with rare min A in three sessions  Baseline:  Goal status: Ongoing  4.  pt will use abdominal breathing 75% of the time in 10 minutes simple-mod complex conversation in three sessions  Baseline: 11/27/22 Goal status: Ongoing   ASSESSMENT:  CLINICAL IMPRESSION: Patient is a 72 y.o. male who was seen today for treatment of speech clarity who demonstrated mod dysarthria and reduced breath support due to Parkinson's disease. See tx note from today. Louder speech slowly beginning to carry over into conversation. Pt would cont to benefit from skilled ST targeting loudness in conversation, and strategies for recalling people's names.  OBJECTIVE IMPAIRMENTS  Objective impairments include memory and dysarthria. These impairments are limiting patient from  managing medications, household responsibilities, and effectively communicating at home and in community.Factors affecting potential to achieve goals and functional outcome are previous level of function and severity of impairments.. Patient will benefit from skilled SLP services to address above impairments and improve overall function.  REHAB POTENTIAL: Good  PLAN: SLP FREQUENCY: 2x/week  SLP DURATION: 8 weeks  PLANNED INTERVENTIONS: Environmental controls, Cueing hierachy, Internal/external aids, Functional tasks, Multimodal communication approach, SLP instruction and feedback, and Compensatory strategies    Pope Brunty, CCC-SLP 12/01/2022, 2:57 PM

## 2022-12-04 ENCOUNTER — Ambulatory Visit: Payer: Medicare Other

## 2022-12-04 DIAGNOSIS — R2681 Unsteadiness on feet: Secondary | ICD-10-CM | POA: Diagnosis not present

## 2022-12-04 DIAGNOSIS — R471 Dysarthria and anarthria: Secondary | ICD-10-CM

## 2022-12-04 DIAGNOSIS — R2689 Other abnormalities of gait and mobility: Secondary | ICD-10-CM

## 2022-12-04 DIAGNOSIS — R29818 Other symptoms and signs involving the nervous system: Secondary | ICD-10-CM | POA: Diagnosis not present

## 2022-12-04 DIAGNOSIS — R41841 Cognitive communication deficit: Secondary | ICD-10-CM | POA: Diagnosis not present

## 2022-12-04 DIAGNOSIS — R293 Abnormal posture: Secondary | ICD-10-CM | POA: Diagnosis not present

## 2022-12-04 NOTE — Therapy (Signed)
OUTPATIENT PHYSICAL THERAPY NEURO TREATMENT   Patient Name: [name] [name] MRN: [id number] DOB:[birth year], 72 y.o., male Today's Date: 11/27/2022   PCP: Riki Sheer, DO REFERRING PROVIDER: Alonza Bogus, DO      END OF SESSION:  PT End of Session - 12/04/22 1423     Visit Number 11    Number of Visits 15    Date for PT Re-Evaluation 12/19/22    Authorization Type Medicare/BCBS    Progress Note Due on Visit 17   (PN was done at visit 7)   PT Start Time 1445    PT Stop Time 1530    PT Time Calculation (min) 45 min    Equipment Utilized During Treatment Gait belt    Activity Tolerance Patient tolerated treatment well    Behavior During Therapy [facility] for tasks assessed/performed               Past Medical History:  Diagnosis Date   Cervical lymphadenopathy    right - followed by ENT   DOE (dyspnea on exertion) 07/15/2018   Dysphagia, neurologic 07/27/2014   Dystonia 1994   diagnosed in [place]   Enlarged lymph nodes 07/28/2007   Gastroesophageal reflux disease 03/07/2020   Localized swelling of right lower extremity 04/12/2019   Low back pain 11/21/2011   Meige syndrome (blepharospasm with oromandibular dystonia) 07/27/2014   Mild neurocognitive disorder due to Parkinson's disease 01/10/2022   Non-Hodgkin lymphoma 2007   diffuse- 6 cycles of chemo with R CHOP Rituxan - last dose 10/08   Nonspecific elevation of levels of transaminase or lactic acid dehydrogenase (LDH) 07/06/2008   Parkinson's disease 07/27/2014   Post-splenectomy 04/02/2015   Past Surgical History:  Procedure Laterality Date   CHOLECYSTECTOMY, LAPAROSCOPIC     EYE SURGERY     x2 as child   PORT-A-CATH REMOVAL     PORTACATH PLACEMENT     SPLENECTOMY     TONSILLECTOMY     Patient Active Problem List   Diagnosis Date Noted   Mild neurocognitive disorder due to Parkinson's disease 01/10/2022   Gastroesophageal reflux disease 03/07/2020   Localized swelling of right lower extremity  04/12/2019   DOE (dyspnea on exertion) 07/15/2018   10 year risk of MI or stroke 7.5% or greater 04/12/2018   Post-splenectomy 04/02/2015   Meige syndrome (blepharospasm with oromandibular dystonia) 07/27/2014   Dysphagia, neurologic 07/27/2014   Parkinson's disease 07/27/2014   Dystonia 07/10/2014   Low back pain 11/21/2011   Nonspecific elevation of levels of transaminase or lactic acid dehydrogenase (LDH) 07/06/2008   Enlarged lymph nodes 07/28/2007   Non-Hodgkin lymphoma 03/23/2007    ONSET DATE: 09/08/2022 (MD referral)  REFERRING DIAG: G20.A1 (ICD-10-CM) - Parkinson's disease without dyskinesia or fluctuating manifestations   THERAPY DIAG:  Unsteadiness on feet  Abnormal posture  Other abnormalities of gait and mobility  Other symptoms and signs involving the nervous system  Rationale for Evaluation and Treatment: Rehabilitation  SUBJECTIVE:  SUBJECTIVE STATEMENT: Been having a good week Pt accompanied by: self  PERTINENT HISTORY: See PMH above  PAIN:  Are you having pain? 0/10   PRECAUTIONS: Fall  WEIGHT BEARING RESTRICTIONS: No  FALLS: Has patient fallen in last 6 months? Yes. Number of falls 1  LIVING ENVIRONMENT: Lives with: lives with their family and lives with their spouse Lives in: House/apartment Stairs:  full flight to 2nd floor with bedroom and additional flight to 3rd floor man cave Has following equipment at home: Single point cane and Walker - 2 wheeled  PLOF: Independent  Not doing exercises and has not been attending [employer]! Moves class  PATIENT GOALS: Getting more strength back  OBJECTIVE:   TODAY'S TREATMENT: 12/04/22 Activity Comments  Nu-step resistance intervals x 6 min   Large amplitude movements in standing x 10 reps -Up -Rock -Twist -Step   Resisted walk 2x2 min 15# to facilitate hip/stepping strategies  Reactive stepping -side shuffle to posts with colored scarfs reaching to relevant color--for rapid change of direction and overcoming FoG   Slalom walking Weave between 4 posts and sharp turns for change of direction  Gait training 3x2 min sessions Outside on concrete, cues for facilitation of LUE reciprocal swing , cue for "swing arm backwards" to enhance elastic recoil with good carryover. Less carryover with divided attention requiring occasional VC to initiate/maintain  Gastroc stretch 2x60 sec w/ slantboard for ankle flexibility    Pt verbalizes wanting to try to work on scooting chairs to and from table for improved ease of this, due to having difficulty. *Discussed with HEP, trying to fit some of these exercises at other times of the day, in case he's not able to do all of them in the early part of the day.  TODAY'S TREATMENT: 12/01/2022 Activity Comments  Sit<>stand from mat surface, 10 reps Cues for "light as a feather" for eccentric control  Sit<>stand from lower (17" mat) surface, with scooting forward/back practice, 2 x 5 reps Cues for technique  Practiced lateral scooting and forward/back scooting Pt prefers forward/back scoot, lifting bottom  Practiced at table-forward/back scooting with 1)forward scoot to edge of chair 2) nose over toes forward lean to lift bottom from chair 3)pull chair forward or push chair back and repeat as needed Pt pleased with this technique and demo good understanding  [place] in standing-lateral and stagger stance positions at obstacles, then negotiating stepping on or over obstacles, including turning to go back through obstacles Min assist>min guard, with pt able to figure out more upright posture, pausing and rocking to help with balance versus quickly going through obstacles with forward posture  Monster walk, 3 reps with cues for wide BOS and dynamic SLS      PATIENT  EDUCATION: Education details: tips for ease of scooting (sofa/recliner), tips for ease of scooting up and back with chair at table; posture and weightshift for obstacle negotiation Person educated: Patient Education method: Explanation, Demonstration, and Verbal cues Education comprehension: verbalized understanding, returned demonstration, and needs further education   Access Code: [email or web address] URL: [email or web address] Date: 11/20/2022 Prepared by: [facility]  Exercises - Seated Cervical Retraction  - 1 x daily - 7 x weekly - 5-10 reps - 3 sec hold - Supine Passive Cervical Retraction  - 1 x daily - 7 x weekly - 10 reps - 3 sec hold - Sit to Stand Without Arm Support  - 1 x daily - 7 x weekly - 5 sets - 5  reps - Scapular Retraction with Resistance  - 1 x daily - 5 x weekly - 2-3 sets - 10 reps - Doorway Pec Stretch at 60 Elevation  - 1 x daily - 5 x weekly - 1 sets - 3 reps - 15 sec hold - Forward Step Up  - 1 x daily - 5 x weekly - 2 sets - 10 reps - Standing Weight Shift  - 1 x daily - 7 x weekly - 1-2 sets - 5 reps - Marching forward  - 1 x daily - 7 x weekly - 1 sets - 4 reps  *PWR! Moves standing x 10 reps, daily, at doorframe for reference.  Perform forward and backward stepping at doorway  -------------------------------------------------------------- Objective measures below taken at initial evaluation:  DIAGNOSTIC FINDINGS: NA  COGNITION: Overall cognitive status: Within functional limits for tasks assessed.  Per neurocognitive testing in 2023:  [facility] *Lower voice volume noted in eval conversations*  SENSATION: NT  POSTURE: rounded shoulders, forward head, and posterior pelvic tilt  Leans to right in unsupported sitting.  LOWER EXTREMITY ROM:   WFL A/ROM, seated position; P/ROM notes tightness bilateral hamstrings   LOWER EXTREMITY MMT:    MMT Right Eval Left Eval  Hip flexion 4 4+  Hip extension    Hip abduction     Hip adduction    Hip internal rotation    Hip external rotation    Knee flexion 4 4  Knee extension 4 4  Ankle dorsiflexion 3+ 3+  Ankle plantarflexion    Ankle inversion    Ankle eversion    (Blank rows = not tested)    TRANSFERS: Assistive device utilized: None  Sit to stand: CGA Stand to sit: CGA  STAIRS: Level of Assistance: Modified independence Stair Negotiation Technique: Alternating Pattern  with Bilateral Rails Number of Stairs: 3  Height of Stairs: 4"-6"  Comments: Pt does report that at home he has to change from step through to step-to pattern about half way up the steps, due to leg weakness.  GAIT: Gait pattern: step through pattern, shuffling, festinating, poor foot clearance- Right, and poor foot clearance- Left Distance walked: 50 ft x 4 Assistive device utilized: None Level of assistance: SBA Comments: Festinating noted at doorways and with turns to change direction  FUNCTIONAL TESTS:  5 times sit to stand: 10.87 sec, arms crossed at chest, retropulsion  Timed up and go (TUG): 11.97 sec 10 meter walk test: 11.68 sec = 2.81 ft/sec TUG cognitive:  12.03 sec TUG manual:  11.78 sec        GOALS: Goals reviewed with patient? Yes  SHORT TERM GOALS: Target date: 11/07/2022  Pt will be independent with HEP for improved balance, gait, functional strength. Baseline:  Has previously demonstrated Goal status: GOAL MET, 11/06/2022  2.  Pt will verbalize tips to reduce freezing/festination with gait, doorways, and turns.  Baseline:  Goal status: GOAL MET, 11/06/2022  3.  Pt will negotiate full flight of steps with handrail with step through pattern, to demonstrate improved functional strength. Baseline: reports half step-through and half step-to pattern; per report 11/06/2022, full stair flight Goal status: GOAL MET, 11/06/2022  LONG TERM GOALS: Target date: 11/21/2022>UPDATED to 12/19/2022  Pt will be independent with HEP for improved strength, balance,  gait. Baseline: continueing to progress HEP Goal status: IN PROGRESS, 11/20/2022  2.  Pt will improve MiniBESTest score to at least 24/28 to decrease fall risk. Baseline:  22/28 Goal status: IN PROGRESS, 11/20/2022  3.  Pt will perform 5x sit<>stand, 2 of 3 trials with no retropulsion, to demonstrate improved transfer efficiency and functional lower extremity strength. Baseline: Performed 2 trials with pt having retropulsion 1 trial Goal status: IN PROGRESS, 11/20/2022  4.  Pt will report 50% improvement in sit<>stand from low surfaces in home. Baseline:  Goal status: IN PROGRESS, 11/20/2022  ASSESSMENT:  CLINICAL IMPRESSION: Skill training in improve mobility and ability to improve motor planning with emphasis on rapid change of direction and facilitation of balance. Difficulty in maintaining reciprocal arm swing with divided attention. Continued sessions to advance POC details and reduce risk for falls  OBJECTIVE IMPAIRMENTS: Abnormal gait, decreased balance, decreased knowledge of use of DME, decreased mobility, difficulty walking, decreased strength, impaired flexibility, and postural dysfunction.   ACTIVITY LIMITATIONS: standing, stairs, transfers, locomotion level, and caring for others  PARTICIPATION LIMITATIONS: community activity and hobbies  PERSONAL FACTORS: 3+ comorbidities: PMH above  are also affecting patient's functional outcome.   REHAB POTENTIAL: Good  CLINICAL DECISION MAKING: Evolving/moderate complexity  EVALUATION COMPLEXITY: Moderate  PLAN:  PT FREQUENCY: 2x/week  PT DURATION: 4 weeks per recert [id number] visit  PLANNED INTERVENTIONS: Therapeutic exercises, Therapeutic activity, Neuromuscular re-education, Balance training, Gait training, Patient/Family education, Self Care, Stair training, and DME instructions  PLAN FOR NEXT SESSION: Continue activities continue to work on addressing freezing of gait and turning; low surface sit<>stand and floor>stand  transfer practice. Gastroc stretch for HEP per request   3:30 PM, 12/04/22 M. Sherlyn Lees, PT, DPT Physical Therapist- [place] Office Number: [phone]

## 2022-12-04 NOTE — Therapy (Signed)
OUTPATIENT SPEECH LANGUAGE PATHOLOGY PARKINSON'S TREATMENT   Patient Name: [name] [name] MRN: [id number] DOB:[birth year], 72 y.o., male Today's Date: 12/04/2022  PCP: Dr. Deloris [name] REFERRING PROVIDER: Alonza Bogus, DO   End of Session - 12/04/22 1412     Visit Number 12    Number of Visits 17    Date for SLP Re-Evaluation 12/18/22    SLP Start Time 12    SLP Stop Time  [id number]    SLP Time Calculation (min) 40 min    Activity Tolerance Patient tolerated treatment well                    Past Medical History:  Diagnosis Date   Cervical lymphadenopathy    right - followed by ENT   DOE (dyspnea on exertion) 07/15/2018   Dysphagia, neurologic 07/27/2014   Dystonia 1994   diagnosed in [place]   Enlarged lymph nodes 07/28/2007   Gastroesophageal reflux disease 03/07/2020   Localized swelling of right lower extremity 04/12/2019   Low back pain 11/21/2011   Meige syndrome (blepharospasm with oromandibular dystonia) 07/27/2014   Mild neurocognitive disorder due to Parkinson's disease 01/10/2022   Non-Hodgkin lymphoma 2007   diffuse- 6 cycles of chemo with R CHOP Rituxan - last dose 10/08   Nonspecific elevation of levels of transaminase or lactic acid dehydrogenase (LDH) 07/06/2008   Parkinson's disease 07/27/2014   Post-splenectomy 04/02/2015   Past Surgical History:  Procedure Laterality Date   CHOLECYSTECTOMY, LAPAROSCOPIC     EYE SURGERY     x2 as child   PORT-A-CATH REMOVAL     PORTACATH PLACEMENT     SPLENECTOMY     TONSILLECTOMY     Patient Active Problem List   Diagnosis Date Noted   Mild neurocognitive disorder due to Parkinson's disease 01/10/2022   Gastroesophageal reflux disease 03/07/2020   Localized swelling of right lower extremity 04/12/2019   DOE (dyspnea on exertion) 07/15/2018   10 year risk of MI or stroke 7.5% or greater 04/12/2018   Post-splenectomy 04/02/2015   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 They are hearing me just fine (drive thru workers)."  Pt accompanied by: self  PAIN:  Are you having pain? No   PATIENT GOALS: "I want to get back to where I was."  OBJECTIVE:   DIAGNOSTIC FINDINGS: From neuropsych note from April 2023: "[name] [name] completed a comprehensive neuropsychological evaluation on 01/10/2022. Please refer to that encounter for the full report and recommendations. Briefly, results suggested isolated impaired performances across complex attention and semantic fluency. Performance variability was also exhibited across all aspects of learning and memory. Regarding etiology, the most likely culprit for ongoing cognitive weakness remains his past history of Parkinson's disease. While processing speed was improved relative to what might be expected, dysfunction surrounding complex attention and encoding/retrieval deficits of memory are quite common in this illness. The fact that motor/gait dysfunction was noted years prior to concern surrounding cognitive decline is also a typical timeline  associated with Parkinson's disease. Mild anxiety and sleep dysfunction could further influence cognitive performances. Research surrounding cognitive side effects of pramipexole is mixed; however, I cannot rule out an ongoing side effect contribution as well."   PATIENT REPORTED OUTCOME MEASURES (PROM): Communication Effectiveness Survey: Pt  scored 18/32 on this survey (pt scored 29/32 at discharge in August 2023).   Patient does not report any communication on the survey as 4 ("very effective"). He reports conversing with a familiar person over the telephone, being part of a conversation in a noisy environment, and speaking to a friend when he is emotionally upset as 3 ("mostly effective"). Scoring 2, or "Somewhat effective" was having a conversation with the family member or friends at home, participating in conversation with strangers in a quiet place, conversing with a stranger over the telephone, and having a conversation while traveling in a car. He rated 1 ("not at all effective") having a conversation with someone at a distance, like across the room.  TODAY'S TREATMENT:  12/04/22: Pt produced loud /a/ with average low-mid 90s dB, everyday sentences produced with average mid 80sdB with independence. In 2-5 minute conversational segments, pt req'd occasional cues for maintaining WNL loudness. With these cues, overall average was in low 70s dB, however without cues pt's average would have been in mid 60s dB. Good use of abdominal breathing today in conversation.  12/01/22: Pt overheard in PT talking with suboptimal volume. Pt produced loud /a/ with average low-mid 90s dB, everyday sentences were measured at average mid 80sdB with initial cues for louder speech. In 2-3 minutes conversation today pt maintained loudness of mid 60's dB, with SLP cue to perform loud /a/ after 10 minutes. This was successful in pt's incr in loudness to WNL for 4 utterances then decr'd to mid 60s dB. Homework provided for 2-3 sentence responses with WNL volume.   11/27/22: Loud /a/ measurements average low-mid 90s dB, everyday sentences were measured at average mid 80sdB. In conversation segments pt maintained loudness in low 70s for first 8 minutes, faded into upper 60s dB for remainder with occasional min A for loudness.  11/24/22: "It's neat I can feel my  diaphragm kick in with the /a/'s." SLP reinforced taht is what pt needs to feel with talking as well. "Oh, I know" pt stated.  Loud /a/ measurements average mid 90s dB, everyday sentences were measured at average low-mid 80sdB. SLP and pt talked in conversation for 5-7 minute segments and pt maintained upper 60s dB with occasional min A.   2/2/924: SLP educated pt about memory strategies for people's names (WARM); pt used an example of a current name he couldn't recall and when he did he wrote it downa nd repeated it to himself 5x, upon direction from SLP.  Loud /a/ measurements average mid 90s dB, everyday sentences were measured at average low-mid 80sdB. Conversational segments of 5 minute intervals resulted in pt WNL loudness for first 3 segments and after this, pt req'd more intense and more frequent cueing for the next 3 segments. Pt self-corrected once in the first segment and once in the second segment but did not self-correct after that.  11/12/22: Loud /a/ measurements average low-mid 90s dB, everyday sentences were measured at average low-mid 80s dB. Pt's conversation was suboptimal (mid-upper 60s) mostly due to loudness decay.In 3-4 minute conversational segments pt req'd occasional mod cues for loudness. Pt with throat clearing frequently until SLP educated pt on ramifications of throat clearing. He drank caffeine free diet drink instead  for 4/5 opportunities.   11/03/22: Loud /a/ measurements average low-mid 90s dB, everyday sentences were measured at average low-mid 80s dB. Pt and SLP talked in 3 minute conversational segments in mod complex conversation with pt having upper 60s dB.  10/29/22: Loud /a/ measurements average mid 90s dB, everyday sentences were measured at average mid-80s dB. In sentence tasks pt req'd rare min A for loudness decay. Pt and SLP talked in mod complex conversation with pt having upper 60s dB for 12 minutes.  10/27/22: Loud /a/ measurements average mid 90s dB, everyday  sentences were at mid-80s dB. In sentence tasks pt req'd occasional min-mod A for loudness decay faded to rare min -mod A for loudness decay. SLP encouraged pt to focus on his effort with loud /a/. In 2-3 minutes conversation segments pt req'd occasional mod A for maintaining loudness.   PATIENT EDUCATION: Education details: see above in "today's treatment" Person educated: Patient Education method: Explanation, Demonstration, and Verbal cues Education comprehension: verbalized understanding and return demonstrated   HOME EXERCISE PROGRAM: Loud /a/ x6 BID, and everyday sentences.   GOALS: Goals reviewed with patient? Yes  SHORT TERM GOALS: Target date: 11/13/22    pt will produce loud /a/ with at least low 90s dB average over three sessions Baseline: 10/27/22, 10/29/22 Goal status: Met  2.  Pt will produce 16/20 sentence responses with 70dB over two sessions Baseline: 10/29/22 Goal status: Met  3.  Pt will produce 5 minutes simple conversation with volume upper 60s dB average over three sessions Baseline:  Goal status: Not met  4.  pt will generate abdominal breathing 80% of the time when engaging in 5 minutes simple conversation in 2 sessions Baseline:  Goal status: partially met    LONG TERM GOALS: Target date: 12/18/22    Pt will score higher than 18/32 on CES in the final two ST sessions Baseline:  Goal status: Ongoing  2.  Pt will report successful usage of strategies for recall of people's names in 2 sessions Baseline:  Goal status: Ongoing  3.  Pt will maintain average of upper 60s dB over 10 minute simple-mod complex conversation with rare min A in three sessions  Baseline:  Goal status: Ongoing  4.  pt will use abdominal breathing 75% of the time in 10 minutes simple-mod complex conversation in three sessions  Baseline: 11/27/22 Goal status: Ongoing   ASSESSMENT:  CLINICAL IMPRESSION: Patient is a 72 y.o. male who was seen today for treatment of speech  clarity who demonstrated mod dysarthria and reduced breath support due to Parkinson's disease. See tx note from today. Louder speech slowly beginning to carry over into conversation. Pt would cont to benefit from skilled ST targeting loudness in conversation, and strategies for recalling people's names.  OBJECTIVE IMPAIRMENTS  Objective impairments include memory and dysarthria. These impairments are limiting patient from managing medications, household responsibilities, and effectively communicating at home and in community.Factors affecting potential to achieve goals and functional outcome are previous level of function and severity of impairments.. Patient will benefit from skilled SLP services to address above impairments and improve overall function.  REHAB POTENTIAL: Good  PLAN: SLP FREQUENCY: 2x/week  SLP DURATION: 8 weeks  PLANNED INTERVENTIONS: Environmental controls, Cueing hierachy, Internal/external aids, Functional tasks, Multimodal communication approach, SLP instruction and feedback, and Compensatory strategies    Handsome Anglin, CCC-SLP 12/04/2022, 2:12 PM

## 2022-12-08 ENCOUNTER — Ambulatory Visit: Payer: Medicare Other | Admitting: Physical Therapy

## 2022-12-08 ENCOUNTER — Encounter: Payer: Self-pay | Admitting: Physical Therapy

## 2022-12-08 ENCOUNTER — Ambulatory Visit: Payer: Medicare Other

## 2022-12-08 DIAGNOSIS — R41841 Cognitive communication deficit: Secondary | ICD-10-CM | POA: Diagnosis not present

## 2022-12-08 DIAGNOSIS — R293 Abnormal posture: Secondary | ICD-10-CM

## 2022-12-08 DIAGNOSIS — R2681 Unsteadiness on feet: Secondary | ICD-10-CM

## 2022-12-08 DIAGNOSIS — R2689 Other abnormalities of gait and mobility: Secondary | ICD-10-CM

## 2022-12-08 DIAGNOSIS — R4701 Aphasia: Secondary | ICD-10-CM

## 2022-12-08 DIAGNOSIS — R471 Dysarthria and anarthria: Secondary | ICD-10-CM | POA: Diagnosis not present

## 2022-12-08 DIAGNOSIS — R29818 Other symptoms and signs involving the nervous system: Secondary | ICD-10-CM | POA: Diagnosis not present

## 2022-12-08 NOTE — Therapy (Signed)
OUTPATIENT PHYSICAL THERAPY NEURO TREATMENT   Patient Name: [name] [name] MRN: [id number] DOB:July 26, 1951, 72 y.o., male Today's Date: 11/27/2022   PCP: Riki Sheer, DO REFERRING PROVIDER: Alonza Bogus, DO      END OF SESSION:  PT End of Session - 12/08/22 1450     Visit Number 12    Number of Visits 15    Date for PT Re-Evaluation 12/19/22    Authorization Type Medicare/BCBS    Progress Note Due on Visit 17   (PN was done at visit 7)   PT Start Time 1449    PT Stop Time 1530    PT Time Calculation (min) 41 min    Equipment Utilized During Treatment Gait belt    Activity Tolerance Patient tolerated treatment well    Behavior During Therapy [facility] for tasks assessed/performed               Past Medical History:  Diagnosis Date   Cervical lymphadenopathy    right - followed by ENT   DOE (dyspnea on exertion) 07/15/2018   Dysphagia, neurologic 07/27/2014   Dystonia 1994   diagnosed in [place]   Enlarged lymph nodes 07/28/2007   Gastroesophageal reflux disease 03/07/2020   Localized swelling of right lower extremity 04/12/2019   Low back pain 11/21/2011   Meige syndrome (blepharospasm with oromandibular dystonia) 07/27/2014   Mild neurocognitive disorder due to Parkinson's disease 01/10/2022   Non-Hodgkin lymphoma 2007   diffuse- 6 cycles of chemo with R CHOP Rituxan - last dose 10/08   Nonspecific elevation of levels of transaminase or lactic acid dehydrogenase (LDH) 07/06/2008   Parkinson's disease 07/27/2014   Post-splenectomy 04/02/2015   Past Surgical History:  Procedure Laterality Date   CHOLECYSTECTOMY, LAPAROSCOPIC     EYE SURGERY     x2 as child   PORT-A-CATH REMOVAL     PORTACATH PLACEMENT     SPLENECTOMY     TONSILLECTOMY     Patient Active Problem List   Diagnosis Date Noted   Mild neurocognitive disorder due to Parkinson's disease 01/10/2022   Gastroesophageal reflux disease 03/07/2020   Localized swelling of right lower extremity  04/12/2019   DOE (dyspnea on exertion) 07/15/2018   10 year risk of MI or stroke 7.5% or greater 04/12/2018   Post-splenectomy 04/02/2015   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/27/2014   Dysphagia, neurologic 07/27/2014   Parkinson's disease 07/27/2014   Dystonia 07/10/2014   Low back pain 11/21/2011   Nonspecific elevation of levels of transaminase or lactic acid dehydrogenase (LDH) 07/06/2008   Enlarged lymph nodes 07/28/2007   Non-Hodgkin lymphoma 03/23/2007    ONSET DATE: 09/08/2022 (MD referral)  REFERRING DIAG: G20.A1 (ICD-10-CM) - Parkinson's disease without dyskinesia or fluctuating manifestations   THERAPY DIAG:  Unsteadiness on feet  Abnormal posture  Other abnormalities of gait and mobility  Rationale for Evaluation and Treatment: Rehabilitation  SUBJECTIVE:  SUBJECTIVE STATEMENT: Working on the scooting last week helped the getting up to the table and out of recliner, it was a "homerun".  Learned from my second session that focusing on bringing the L arm back helps with more natural arm swing. Pt accompanied by: self  PERTINENT HISTORY: See PMH above  PAIN:  Are you having pain? 0/10   PRECAUTIONS: Fall  WEIGHT BEARING RESTRICTIONS: No  FALLS: Has patient fallen in last 6 months? Yes. Number of falls 1  LIVING ENVIRONMENT: Lives with: lives with their family and lives with their spouse Lives in: House/apartment Stairs:  full flight to 2nd floor with bedroom and additional flight to 3rd floor man cave Has following equipment at home: Single point cane and Walker - 2 wheeled  PLOF: Independent  Not doing exercises and has not been attending PWR! Moves class  PATIENT GOALS: Getting more strength back  OBJECTIVE:    TODAY'S TREATMENT:  12/08/2022 Activity Comments  NuStep, Level 3>5, 4 extremities, x 7 minutes, Arms at 9, for aerobic exercise warm-up Cues to keep SPM >80  Sit<>stand folllowed by PWR! Moves, x 5 reach: -PWR! Up -PWR! [place]! Twist -PWR! Step Uses mirror for visual feedback  Gait training, 300 ft in clinic distances cue for "swing arm backwards" to enhance elastic recoil with good carryover for improved reciprocal arm swing  Short distance gait with turn, focus on "hook turn"; added cognitive component for touching scarves and naming foods/teams Decreased foot clearance with fatigue and with added dual tasks.  Cues to slow pace for improved foot clearance for turns  Multi-directional stepping with added cognitive/naming task Slowed pace with complexity of tasks added         Access Code: [email or web address] URL: [email or web address] Date: 11/20/2022 Prepared by: [facility]  Exercises - Seated Cervical Retraction  - 1 x daily - 7 x weekly - 5-10 reps - 3 sec hold - Supine Passive Cervical Retraction  - 1 x daily - 7 x weekly - 10 reps - 3 sec hold - Sit to Stand Without Arm Support  - 1 x daily - 7 x weekly - 5 sets - 5 reps - Scapular Retraction with Resistance  - 1 x daily - 5 x weekly - 2-3 sets - 10 reps - Doorway Pec Stretch at 60 Elevation  - 1 x daily - 5 x weekly - 1 sets - 3 reps - 15 sec hold - Forward Step Up  - 1 x daily - 5 x weekly - 2 sets - 10 reps - Standing Weight Shift  - 1 x daily - 7 x weekly - 1-2 sets - 5 reps - Marching forward  - 1 x daily - 7 x weekly - 1 sets - 4 reps  *PWR! Moves standing x 10 reps, daily, at doorframe for reference.  Perform forward and backward stepping at doorway  -------------------------------------------------------------- Objective measures below taken at initial evaluation:  DIAGNOSTIC FINDINGS: NA  COGNITION: Overall cognitive status: Within functional limits for tasks assessed.  Per neurocognitive  testing in 2023:  [facility] *Lower voice volume noted in eval conversations*  SENSATION: NT  POSTURE: rounded shoulders, forward head, and posterior pelvic tilt  Leans to right in unsupported sitting.  LOWER EXTREMITY ROM:   WFL A/ROM, seated position; P/ROM notes tightness bilateral hamstrings   LOWER EXTREMITY MMT:    MMT Right Eval Left Eval  Hip flexion 4 4+  Hip extension    Hip abduction  Hip adduction    Hip internal rotation    Hip external rotation    Knee flexion 4 4  Knee extension 4 4  Ankle dorsiflexion 3+ 3+  Ankle plantarflexion    Ankle inversion    Ankle eversion    (Blank rows = not tested)    TRANSFERS: Assistive device utilized: None  Sit to stand: CGA Stand to sit: CGA  STAIRS: Level of Assistance: Modified independence Stair Negotiation Technique: Alternating Pattern  with Bilateral Rails Number of Stairs: 3  Height of Stairs: 4"-6"  Comments: Pt does report that at home he has to change from step through to step-to pattern about half way up the steps, due to leg weakness.  GAIT: Gait pattern: step through pattern, shuffling, festinating, poor foot clearance- Right, and poor foot clearance- Left Distance walked: 50 ft x 4 Assistive device utilized: None Level of assistance: SBA Comments: Festinating noted at doorways and with turns to change direction  FUNCTIONAL TESTS:  5 times sit to stand: 10.87 sec, arms crossed at chest, retropulsion  Timed up and go (TUG): 11.97 sec 10 meter walk test: 11.68 sec = 2.81 ft/sec TUG cognitive:  12.03 sec TUG manual:  11.78 sec        GOALS: Goals reviewed with patient? Yes  SHORT TERM GOALS: Target date: 11/07/2022  Pt will be independent with HEP for improved balance, gait, functional strength. Baseline:  Has previously demonstrated Goal status: GOAL MET, 11/06/2022  2.  Pt will verbalize tips to reduce freezing/festination with gait, doorways, and turns.  Baseline:  Goal status: GOAL MET,  11/06/2022  3.  Pt will negotiate full flight of steps with handrail with step through pattern, to demonstrate improved functional strength. Baseline: reports half step-through and half step-to pattern; per report 11/06/2022, full stair flight Goal status: GOAL MET, 11/06/2022  LONG TERM GOALS: Target date: 11/21/2022>UPDATED to 12/19/2022  Pt will be independent with HEP for improved strength, balance, gait. Baseline: continueing to progress HEP Goal status: IN PROGRESS, 11/20/2022  2.  Pt will improve MiniBESTest score to at least 24/28 to decrease fall risk. Baseline:  22/28 Goal status: IN PROGRESS, 11/20/2022  3.  Pt will perform 5x sit<>stand, 2 of 3 trials with no retropulsion, to demonstrate improved transfer efficiency and functional lower extremity strength. Baseline: Performed 2 trials with pt having retropulsion 1 trial Goal status: IN PROGRESS, 11/20/2022  4.  Pt will report 50% improvement in sit<>stand from low surfaces in home. Baseline:  Goal status: IN PROGRESS, 11/20/2022  ASSESSMENT:  CLINICAL IMPRESSION: Skilled PT session today continued to focus on functional strength with repeated sit<>Stand, with added PWR! Moves to incorporate dual task.  Gait training for arm swing/step length/turn practice with good carryover from last session with improved arm swing and step length.  With addition of dual tasks (cognitive building complexity), pt slows movement patterns and has decreased foot clearance with turns; no overt LOB.  Discussed this after activity, and pt aware that dual tasking slows movement patterns.  Will continue to address and work towards goals.  OBJECTIVE IMPAIRMENTS: Abnormal gait, decreased balance, decreased knowledge of use of DME, decreased mobility, difficulty walking, decreased strength, impaired flexibility, and postural dysfunction.   ACTIVITY LIMITATIONS: standing, stairs, transfers, locomotion level, and caring for others  PARTICIPATION LIMITATIONS:  community activity and hobbies  PERSONAL FACTORS: 3+ comorbidities: PMH above  are also affecting patient's functional outcome.   REHAB POTENTIAL: Good  CLINICAL DECISION MAKING: Evolving/moderate complexity  EVALUATION COMPLEXITY: Moderate  PLAN:  PT FREQUENCY: 2x/week  PT DURATION: 4 weeks per recert [id number] visit  PLANNED INTERVENTIONS: Therapeutic exercises, Therapeutic activity, Neuromuscular re-education, Balance training, Gait training, Patient/Family education, Self Care, Stair training, and DME instructions  PLAN FOR NEXT SESSION: Try to find multi-directional stepping chart for HEP.  Continue activities continue to work on addressing freezing of gait and turning; low surface sit<>stand and floor>stand transfer practice. Dual tasking with gait and turns.  Gastroc stretch for HEP per request   Mady Haagensen, PT 12/09/22 11:41 AM Phone: [phone] Fax: [phone]  [facility] at [facility], [address] [place], [place] [address] Phone # [phone] Fax # [phone]

## 2022-12-08 NOTE — Therapy (Signed)
OUTPATIENT SPEECH LANGUAGE PATHOLOGY PARKINSON'S TREATMENT   Patient Name: [name] [name] MRN: [id number] DOB:November 15, 1950, 72 y.o., male Today's Date: 12/08/2022  PCP: Dr. Zipporah [name] REFERRING PROVIDER: Kerin Salen, DO   End of Session - 12/08/22 1426     Visit Number 13    Number of Visits 17    Date for SLP Re-Evaluation 12/18/22    SLP Start Time 1407    SLP Stop Time  1445    SLP Time Calculation (min) 38 min    Activity Tolerance Patient tolerated treatment well                    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/27/2014   Mild neurocognitive disorder due to Parkinson's disease 01/10/2022   Non-Hodgkin lymphoma 2007   diffuse- 6 cycles of chemo with R CHOP Rituxan - last dose 10/08   Nonspecific elevation of levels of transaminase or lactic acid dehydrogenase (LDH) 07/06/2008   Parkinson's disease 07/27/2014   Post-splenectomy 04/02/2015   Past Surgical History:  Procedure Laterality Date   CHOLECYSTECTOMY, LAPAROSCOPIC     EYE SURGERY     x2 as child   PORT-A-CATH REMOVAL     PORTACATH PLACEMENT     SPLENECTOMY     TONSILLECTOMY     Patient Active Problem List   Diagnosis Date Noted   Mild neurocognitive disorder due to Parkinson's disease 01/10/2022   Gastroesophageal reflux disease 03/07/2020   Localized swelling of right lower extremity 04/12/2019   DOE (dyspnea on exertion) 07/15/2018   10 year risk of MI or stroke 7.5% or greater 04/12/2018   Post-splenectomy 04/02/2015   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/27/2014    Dysphagia, neurologic 07/27/2014   Parkinson's disease 07/27/2014   Dystonia 07/10/2014   Low back pain 11/21/2011   Nonspecific elevation of levels of transaminase or lactic acid dehydrogenase (LDH) 07/06/2008   Enlarged lymph nodes 07/28/2007   Non-Hodgkin lymphoma 03/23/2007    ONSET DATE: Dx in mid-2010's  REFERRING DIAG: G20 - Parkinson's disease   THERAPY DIAG:  Dysarthria and anarthria  Cognitive communication deficit  Aphasia  Rationale for Evaluation and Treatment Rehabilitation  SUBJECTIVE:   SUBJECTIVE STATEMENT: "Every area is getting better with the speech."  Pt accompanied by: self  PAIN:  Are you having pain? No   PATIENT GOALS: "I want to get back to where I was."  OBJECTIVE:   DIAGNOSTIC FINDINGS: From neuropsych note from April 2023: "[name] [name] completed a comprehensive neuropsychological evaluation on 01/10/2022. Please refer to that encounter for the full report and recommendations. Briefly, results suggested isolated impaired performances across complex attention and semantic fluency. Performance variability was also exhibited across all aspects of learning and memory. Regarding etiology, the most likely culprit for ongoing cognitive weakness remains his past history of Parkinson's disease. While processing speed was improved relative to what might be expected, dysfunction surrounding complex attention and encoding/retrieval deficits of memory are quite common in this illness. The fact that motor/gait dysfunction was noted years prior to concern surrounding cognitive decline is also a typical timeline associated  with Parkinson's disease. Mild anxiety and sleep dysfunction could further influence cognitive performances. Research surrounding cognitive side effects of pramipexole is mixed; however, I cannot rule out an ongoing side effect contribution as well."   PATIENT REPORTED OUTCOME MEASURES (PROM): Communication Effectiveness Survey: Pt scored 18/32  on this survey (pt scored 29/32 at discharge in August 2023).   Patient does not report any communication on the survey as 4 ("very effective"). He reports conversing with a familiar person over the telephone, being part of a conversation in a noisy environment, and speaking to a friend when he is emotionally upset as 3 ("mostly effective"). Scoring 2, or "Somewhat effective" was having a conversation with the family member or friends at home, participating in conversation with strangers in a quiet place, conversing with a stranger over the telephone, and having a conversation while traveling in a car. He rated 1 ("not at all effective") having a conversation with someone at a distance, like across the room.  TODAY'S TREATMENT:  12/08/22: Pt reports strategies for recalling people's names has been helpful since learning those 5 sessions ago. Pt produced loud /a/ with average mid 90s dB, everyday sentences were measured at average low-mid 80sdB with initial cues for louder speech. SLP reiterated that pt will want to have effort with /a/ in his mind when he talks in conversation. In conversation pt maintained upper 60s dB with occasional min cues for loudness.   12/04/22: Pt produced loud /a/ with average low-mid 90s dB, everyday sentences produced with average mid 80sdB with independence. In 2-5 minute conversational segments, pt req'd occasional cues for maintaining WNL loudness. With these cues, overall average was in low 70s dB, however without cues pt's average would have been in mid 60s dB. Good use of abdominal breathing today in conversation.  12/01/22: Pt overheard in PT talking with suboptimal volume. Pt produced loud /a/ with average low-mid 90s dB, everyday sentences were measured at average mid 80sdB with initial cues for louder speech. In 2-3 minutes conversation today pt maintained loudness of mid 60's dB, with SLP cue to perform loud /a/ after 10 minutes. This was successful in pt's incr in  loudness to WNL for 4 utterances then decr'd to mid 60s dB. Homework provided for 2-3 sentence responses with WNL volume.   11/27/22: Loud /a/ measurements average low-mid 90s dB, everyday sentences were measured at average mid 80sdB. In conversation segments pt maintained loudness in low 70s for first 8 minutes, faded into upper 60s dB for remainder with occasional min A for loudness.  11/24/22: "It's neat I can feel my diaphragm kick in with the /a/'s." SLP reinforced taht is what pt needs to feel with talking as well. "Oh, I know" pt stated.  Loud /a/ measurements average mid 90s dB, everyday sentences were measured at average low-mid 80sdB. SLP and pt talked in conversation for 5-7 minute segments and pt maintained upper 60s dB with occasional min A.   2/2/924: SLP educated pt about memory strategies for people's names (WARM); pt used an example of a current name he couldn't recall and when he did he wrote it downa nd repeated it to himself 5x, upon direction from SLP.  Loud /a/ measurements average mid 90s dB, everyday sentences were measured at average low-mid 80sdB. Conversational segments of 5 minute intervals resulted in pt WNL loudness for first 3 segments and after this, pt req'd more intense and more frequent cueing for the next 3 segments. Pt self-corrected once in the first segment  and once in the second segment but did not self-correct after that.  11/12/22: Loud /a/ measurements average low-mid 90s dB, everyday sentences were measured at average low-mid 80s dB. Pt's conversation was suboptimal (mid-upper 60s) mostly due to loudness decay.In 3-4 minute conversational segments pt req'd occasional mod cues for loudness. Pt with throat clearing frequently until SLP educated pt on ramifications of throat clearing. He drank caffeine free diet drink instead for 4/5 opportunities.   11/03/22: Loud /a/ measurements average low-mid 90s dB, everyday sentences were measured at average low-mid 80s dB. Pt  and SLP talked in 3 minute conversational segments in mod complex conversation with pt having upper 60s dB.  10/29/22: Loud /a/ measurements average mid 90s dB, everyday sentences were measured at average mid-80s dB. In sentence tasks pt req'd rare min A for loudness decay. Pt and SLP talked in mod complex conversation with pt having upper 60s dB for 12 minutes.  10/27/22: Loud /a/ measurements average mid 90s dB, everyday sentences were at mid-80s dB. In sentence tasks pt req'd occasional min-mod A for loudness decay faded to rare min -mod A for loudness decay. SLP encouraged pt to focus on his effort with loud /a/. In 2-3 minutes conversation segments pt req'd occasional mod A for maintaining loudness.   PATIENT EDUCATION: Education details: see above in "today's treatment" Person educated: Patient Education method: Explanation, Demonstration, and Verbal cues Education comprehension: verbalized understanding and return demonstrated   HOME EXERCISE PROGRAM: Loud /a/ x6 BID, and everyday sentences.   GOALS: Goals reviewed with patient? Yes  SHORT TERM GOALS: Target date: 11/13/22    pt will produce loud /a/ with at least low 90s dB average over three sessions Baseline: 10/27/22, 10/29/22 Goal status: Met  2.  Pt will produce 16/20 sentence responses with 70dB over two sessions Baseline: 10/29/22 Goal status: Met  3.  Pt will produce 5 minutes simple conversation with volume upper 60s dB average over three sessions Baseline:  Goal status: Not met  4.  pt will generate abdominal breathing 80% of the time when engaging in 5 minutes simple conversation in 2 sessions Baseline:  Goal status: partially met    LONG TERM GOALS: Target date: 12/18/22    Pt will score higher than 18/32 on CES in the final two ST sessions Baseline:  Goal status: Ongoing  2.  Pt will report successful usage of strategies for recall of people's names in 2 sessions Baseline: 12/08/22 Goal status: Ongoing  3.   Pt will maintain average of upper 60s dB over 10 minute simple-mod complex conversation with rare min A in three sessions  Baseline:  Goal status: Ongoing  4.  pt will use abdominal breathing 75% of the time in 10 minutes simple-mod complex conversation in three sessions  Baseline: 11/27/22, 12/08/22 Goal status: Ongoing   ASSESSMENT:  CLINICAL IMPRESSION: Patient is a 72 y.o. male who was seen today for treatment of speech clarity who demonstrated mod dysarthria and reduced breath support due to Parkinson's disease. See tx note from today. Louder speech slowly beginning to carry over into conversation. Pt would cont to benefit from skilled ST targeting loudness in conversation, and strategies for recalling people's names.  OBJECTIVE IMPAIRMENTS  Objective impairments include memory and dysarthria. These impairments are limiting patient from managing medications, household responsibilities, and effectively communicating at home and in community.Factors affecting potential to achieve goals and functional outcome are previous level of function and severity of impairments.. Patient will benefit from skilled SLP services  to address above impairments and improve overall function.  REHAB POTENTIAL: Good  PLAN: SLP FREQUENCY: 2x/week  SLP DURATION: 8 weeks  PLANNED INTERVENTIONS: Environmental controls, Cueing hierachy, Internal/external aids, Functional tasks, Multimodal communication approach, SLP instruction and feedback, and Compensatory strategies    Ephram Kornegay, CCC-SLP 12/08/2022, 2:27 PM

## 2022-12-11 ENCOUNTER — Ambulatory Visit: Payer: Medicare Other

## 2022-12-11 ENCOUNTER — Ambulatory Visit: Payer: Medicare Other | Admitting: Physical Therapy

## 2022-12-15 ENCOUNTER — Ambulatory Visit: Payer: Medicare Other

## 2022-12-15 ENCOUNTER — Ambulatory Visit: Payer: Medicare Other | Admitting: Physical Therapy

## 2022-12-15 ENCOUNTER — Encounter: Payer: Self-pay | Admitting: Physical Therapy

## 2022-12-15 DIAGNOSIS — R29818 Other symptoms and signs involving the nervous system: Secondary | ICD-10-CM | POA: Diagnosis not present

## 2022-12-15 DIAGNOSIS — R2689 Other abnormalities of gait and mobility: Secondary | ICD-10-CM

## 2022-12-15 DIAGNOSIS — R471 Dysarthria and anarthria: Secondary | ICD-10-CM | POA: Diagnosis not present

## 2022-12-15 DIAGNOSIS — R41841 Cognitive communication deficit: Secondary | ICD-10-CM | POA: Diagnosis not present

## 2022-12-15 DIAGNOSIS — R293 Abnormal posture: Secondary | ICD-10-CM | POA: Diagnosis not present

## 2022-12-15 DIAGNOSIS — R2681 Unsteadiness on feet: Secondary | ICD-10-CM

## 2022-12-15 DIAGNOSIS — R4701 Aphasia: Secondary | ICD-10-CM

## 2022-12-15 NOTE — Therapy (Signed)
OUTPATIENT PHYSICAL THERAPY NEURO TREATMENT   Patient Name: [name] [name] MRN: [id number] DOB:[birth year], 72 y.o., male Today's Date: 11/27/2022   PCP: Riki Sheer, DO REFERRING PROVIDER: Alonza Bogus, DO      END OF SESSION:  PT End of Session - 12/15/22 1407     Visit Number 13    Number of Visits 15    Date for PT Re-Evaluation 12/19/22    Authorization Type Medicare/BCBS    Progress Note Due on Visit 17   (PN was done at visit 7)   PT Start Time 1405    PT Stop Time 1446    PT Time Calculation (min) 41 min    Equipment Utilized During Treatment Gait belt    Activity Tolerance Patient tolerated treatment well    Behavior During Therapy WFL for tasks assessed/performed               Past Medical History:  Diagnosis Date   Cervical lymphadenopathy    right - followed by ENT   DOE (dyspnea on exertion) 07/15/2018   Dysphagia, neurologic 07/27/2014   Dystonia 1994   diagnosed in [place]   Enlarged lymph nodes 07/28/2007   Gastroesophageal reflux disease 03/07/2020   Localized swelling of right lower extremity 04/12/2019   Low back pain 11/21/2011   Meige syndrome (blepharospasm with oromandibular dystonia) 07/27/2014   Mild neurocognitive disorder due to Parkinson's disease 01/10/2022   Non-Hodgkin lymphoma 2007   diffuse- 6 cycles of chemo with R CHOP Rituxan - last dose 10/08   Nonspecific elevation of levels of transaminase or lactic acid dehydrogenase (LDH) 07/06/2008   Parkinson's disease 07/27/2014   Post-splenectomy 04/02/2015   Past Surgical History:  Procedure Laterality Date   CHOLECYSTECTOMY, LAPAROSCOPIC     EYE SURGERY     x2 as child   PORT-A-CATH REMOVAL     PORTACATH PLACEMENT     SPLENECTOMY     TONSILLECTOMY     Patient Active Problem List   Diagnosis Date Noted   Mild neurocognitive disorder due to Parkinson's disease 01/10/2022   Gastroesophageal reflux disease 03/07/2020   Localized swelling of right lower extremity  04/12/2019   DOE (dyspnea on exertion) 07/15/2018   10 year risk of MI or stroke 7.5% or greater 04/12/2018   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/02/2015   Meige syndrome (blepharospasm with oromandibular dystonia) 07/27/2014   Dysphagia, neurologic 07/27/2014   Parkinson's disease 07/27/2014   Dystonia 07/10/2014   Low back pain 11/21/2011   Nonspecific elevation of levels of transaminase or lactic acid dehydrogenase (LDH) 07/06/2008   Enlarged lymph nodes 07/28/2007   Non-Hodgkin lymphoma 03/23/2007    ONSET DATE: 09/08/2022 (MD referral)  REFERRING DIAG: G20.A1 (ICD-10-CM) - Parkinson's disease without dyskinesia or fluctuating manifestations   THERAPY DIAG:  Unsteadiness on feet  Abnormal posture  Other abnormalities of gait and mobility  Rationale for Evaluation and Treatment: Rehabilitation  SUBJECTIVE:  SUBJECTIVE STATEMENT: Think the exercises have been helpful.  Beginning to feel stronger.  Can go up the whole flight and the second with step through pattern.   Pt accompanied by: self  PERTINENT HISTORY: See PMH above  PAIN:  Are you having pain? 0/10   PRECAUTIONS: Fall  WEIGHT BEARING RESTRICTIONS: No  FALLS: Has patient fallen in last 6 months? Yes. Number of falls 1  LIVING ENVIRONMENT: Lives with: lives with their family and lives with their spouse Lives in: House/apartment Stairs:  full flight to 2nd floor with bedroom and additional flight to 3rd floor man cave Has following equipment at home: Single point cane and Walker - 2 wheeled  PLOF: Independent  Not doing exercises and has not been attending [employer] Moves class  PATIENT GOALS: Getting more strength back  OBJECTIVE:    TODAY'S TREATMENT: 12/15/2022 Activity Comments  Gastroc stretches, runner's stretch 2 x 30 sec, foot  propped at 4" step, then at bottom step, gastroc stretch bilaterally, 2 x 15" Pt likes gastroc stretch at steps the best  Multi-directional stepping with added cognitive/naming task Slowed pace with complexity of tasks added  Indoor gait x 400 ft, then outdoor gait on level and incline/decline surfaces Cues for posture, slowed pace with descending hills outdoors.  Narrow BOS with turns, cues to slow turns and improve foot clearance  Vitals after gait:  96% O2, 76 HR   Discussed walking program, beginning in the home, 3-5 minutes, 3x/day        Access Code: [email or web address] URL: [email or web address] Date: 12/15/2022 Prepared by: [facility]  Exercises - Seated Cervical Retraction  - 1 x daily - 7 x weekly - 5-10 reps - 3 sec hold - Supine Passive Cervical Retraction  - 1 x daily - 7 x weekly - 10 reps - 3 sec hold - Sit to Stand Without Arm Support  - 1 x daily - 7 x weekly - 5 sets - 5 reps - Scapular Retraction with Resistance  - 1 x daily - 5 x weekly - 2-3 sets - 10 reps - Doorway Pec Stretch at 60 Elevation  - 1 x daily - 5 x weekly - 1 sets - 3 reps - 15 sec hold - Forward Step Up  - 1 x daily - 5 x weekly - 2 sets - 10 reps - Standing Weight Shift  - 1 x daily - 7 x weekly - 1-2 sets - 5 reps - Marching forward  - 1 x daily - 7 x weekly - 1 sets - 4 reps - Standing Bilateral Gastroc Stretch with Step  - 1-2 x daily - 7 x weekly - 1 sets - 3 reps - 15-30 sec hold  Provided handout for multi-directional stepping activities at home         PATIENT EDUCATION: Education details: Discussed POC, optimal fitness program upon d/c from PT.  Reports morning time is best to do exercises.HEP additions; walking at home:  start 3-5 minutes, 3x/day Person educated: Patient Education method: Explanation, Demonstration, Verbal cues, and Handouts Education comprehension: verbalized understanding, returned demonstration, and needs further  education  -------------------------------------------------------------- Objective measures below taken at initial evaluation:  DIAGNOSTIC FINDINGS: NA  COGNITION: Overall cognitive status: Within functional limits for tasks assessed.  Per neurocognitive testing in 2023:  [facility] *Lower voice volume noted in eval conversations*  SENSATION: NT  POSTURE: rounded shoulders, forward head, and posterior pelvic tilt  Leans to right in unsupported sitting.  LOWER EXTREMITY ROM:   WFL A/ROM, seated position; P/ROM notes tightness bilateral hamstrings   LOWER EXTREMITY MMT:    MMT Right Eval Left Eval  Hip flexion 4 4+  Hip extension    Hip abduction    Hip adduction    Hip internal rotation    Hip external rotation    Knee flexion 4 4  Knee extension 4 4  Ankle dorsiflexion 3+ 3+  Ankle plantarflexion    Ankle inversion    Ankle eversion    (Blank rows = not tested)    TRANSFERS: Assistive device utilized: None  Sit to stand: CGA Stand to sit: CGA  STAIRS: Level of Assistance: Modified independence Stair Negotiation Technique: Alternating Pattern  with Bilateral Rails Number of Stairs: 3  Height of Stairs: 4"-6"  Comments: Pt does report that at home he has to change from step through to step-to pattern about half way up the steps, due to leg weakness.  GAIT: Gait pattern: step through pattern, shuffling, festinating, poor foot clearance- Right, and poor foot clearance- Left Distance walked: 50 ft x 4 Assistive device utilized: None Level of assistance: SBA Comments: Festinating noted at doorways and with turns to change direction  FUNCTIONAL TESTS:  5 times sit to stand: 10.87 sec, arms crossed at chest, retropulsion  Timed up and go (TUG): 11.97 sec 10 meter walk test: 11.68 sec = 2.81 ft/sec TUG cognitive:  12.03 sec TUG manual:  11.78 sec        GOALS: Goals reviewed with patient? Yes  SHORT TERM GOALS: Target date: 11/07/2022  Pt will be  independent with HEP for improved balance, gait, functional strength. Baseline:  Has previously demonstrated Goal status: GOAL MET, 11/06/2022  2.  Pt will verbalize tips to reduce freezing/festination with gait, doorways, and turns.  Baseline:  Goal status: GOAL MET, 11/06/2022  3.  Pt will negotiate full flight of steps with handrail with step through pattern, to demonstrate improved functional strength. Baseline: reports half step-through and half step-to pattern; per report 11/06/2022, full stair flight Goal status: GOAL MET, 11/06/2022  LONG TERM GOALS: Target date: 11/21/2022>UPDATED to 12/19/2022  Pt will be independent with HEP for improved strength, balance, gait. Baseline: continueing to progress HEP Goal status: IN PROGRESS, 11/20/2022  2.  Pt will improve MiniBESTest score to at least 24/28 to decrease fall risk. Baseline:  22/28 Goal status: IN PROGRESS, 11/20/2022  3.  Pt will perform 5x sit<>stand, 2 of 3 trials with no retropulsion, to demonstrate improved transfer efficiency and functional lower extremity strength. Baseline: Performed 2 trials with pt having retropulsion 1 trial Goal status: IN PROGRESS, 11/20/2022  4.  Pt will report 50% improvement in sit<>stand from low surfaces in home. Baseline:  Goal status: IN PROGRESS, 11/20/2022  ASSESSMENT:  CLINICAL IMPRESSION: Pt arrives today reporting he is feeling stronger, both with stairs and with getting in and out of wife's elevated vehicle.  Worked on gait indoors and outdoors, as pt has inclines around his home; pt somewhat winded after 2 laps up and down gentle hill outdoors, but vitals remain WFL.  Added to HEP walking program starting with 3-5 minutes indoor gait, 3x/day to address gait endurance; also added multi-directional stepping and gastroc stretch.  Pt feels he is ready for discharge and in discussing goals, he is progressing well and likely to meet LTGs.  OBJECTIVE IMPAIRMENTS: Abnormal gait, decreased balance,  decreased knowledge of use of DME, decreased mobility, difficulty walking, decreased strength, impaired flexibility, and postural  dysfunction.   ACTIVITY LIMITATIONS: standing, stairs, transfers, locomotion level, and caring for others  PARTICIPATION LIMITATIONS: community activity and hobbies  PERSONAL FACTORS: 3+ comorbidities: PMH above  are also affecting patient's functional outcome.   REHAB POTENTIAL: Good  CLINICAL DECISION MAKING: Evolving/moderate complexity  EVALUATION COMPLEXITY: Moderate  PLAN:  PT FREQUENCY: 2x/week  PT DURATION: 4 weeks per recert [id number] visit  PLANNED INTERVENTIONS: Therapeutic exercises, Therapeutic activity, Neuromuscular re-education, Balance training, Gait training, Patient/Family education, Self Care, Stair training, and DME instructions  PLAN FOR NEXT SESSION: Check LTGs and plan for d/c next visit.  Schedule screens in 6-9 months.    Mady Haagensen, PT 12/15/22 5:19 PM Phone: [phone] Fax: [phone]  [facility] at [facility] [place], [address], [place] [address] Phone # [phone] Fax # [phone]

## 2022-12-15 NOTE — Therapy (Signed)
OUTPATIENT SPEECH LANGUAGE PATHOLOGY PARKINSON'S TREATMENT   Patient Name: [name] [name] MRN: [id number] DOB:January 05, 1951, 72 y.o., male Today's Date: 12/15/2022  PCP: Dr. Deloris Ping REFERRING PROVIDER: Alonza Bogus, DO   End of Session - 12/15/22 1503     Visit Number 14    Number of Visits 17    Date for SLP Re-Evaluation 12/18/22    SLP Start Time 1450    SLP Stop Time  1530    SLP Time Calculation (min) 40 min    Activity Tolerance Patient tolerated treatment well                    Past Medical History:  Diagnosis Date   Cervical lymphadenopathy    right - followed by ENT   DOE (dyspnea on exertion) 07/15/2018   Dysphagia, neurologic 07/27/2014   Dystonia 1994   diagnosed in [place]   Enlarged lymph nodes 07/28/2007   Gastroesophageal reflux disease 03/07/2020   Localized swelling of right lower extremity 04/12/2019   Low back pain 11/21/2011   Meige syndrome (blepharospasm with oromandibular dystonia) 07/27/2014   Mild neurocognitive disorder due to Parkinson's disease 01/10/2022   Non-Hodgkin lymphoma 2007   diffuse- 6 cycles of chemo with R CHOP Rituxan - last dose 10/08   Nonspecific elevation of levels of transaminase or lactic acid dehydrogenase (LDH) 07/06/2008   Parkinson's disease 07/27/2014   Post-splenectomy 04/02/2015   Past Surgical History:  Procedure Laterality Date   CHOLECYSTECTOMY, LAPAROSCOPIC     EYE SURGERY     x2 as child   PORT-A-CATH REMOVAL     PORTACATH PLACEMENT     SPLENECTOMY     TONSILLECTOMY     Patient Active Problem List   Diagnosis Date Noted   Mild neurocognitive disorder due to Parkinson's disease 01/10/2022   Gastroesophageal reflux disease 03/07/2020   Localized swelling of right lower extremity 04/12/2019   DOE (dyspnea on exertion) 07/15/2018   10 year risk of MI or stroke 7.5% or greater 04/12/2018   Post-splenectomy 04/02/2015   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/27/2014    Dysphagia, neurologic 07/27/2014   Parkinson's disease 07/27/2014   Dystonia 07/10/2014   Low back pain 11/21/2011   Nonspecific elevation of levels of transaminase or lactic acid dehydrogenase (LDH) 07/06/2008   Enlarged lymph nodes 07/28/2007   Non-Hodgkin lymphoma 03/23/2007    ONSET DATE: Dx in mid-2010's  REFERRING DIAG: G20 - Parkinson's disease   THERAPY DIAG:  Dysarthria and anarthria  Cognitive communication deficit  Aphasia  Rationale for Evaluation and Treatment Rehabilitation  SUBJECTIVE:   SUBJECTIVE STATEMENT: "I can tell when I am too soft with my speech."  Pt accompanied by: self  PAIN:  Are you having pain? No   PATIENT GOALS: "I want to get back to where I was."  OBJECTIVE:   DIAGNOSTIC FINDINGS: From neuropsych note from April 2023: "[name] [name] completed a comprehensive neuropsychological evaluation on 01/10/2022. Please refer to that encounter for the full report and recommendations. Briefly, results suggested isolated impaired performances across complex attention and semantic fluency. Performance variability was also exhibited across all aspects of learning and memory. Regarding etiology, the most likely culprit for ongoing cognitive weakness remains his past history of Parkinson's disease. While processing speed was improved relative to what might be expected, dysfunction surrounding complex attention and encoding/retrieval deficits of memory are quite common in this illness. The fact that motor/gait dysfunction was noted years prior to concern surrounding cognitive decline is also a  typical timeline associated with Parkinson's disease. Mild anxiety and sleep dysfunction could further influence cognitive performances. Research surrounding cognitive side effects of pramipexole is mixed; however, I cannot rule out an ongoing side effect contribution as well."   PATIENT REPORTED OUTCOME MEASURES (PROM): Communication Effectiveness Survey: Pt scored 18/32  on this survey (pt scored 29/32 at discharge in August 2023).   Patient does not report any communication on the survey as 4 ("very effective"). He reports conversing with a familiar person over the telephone, being part of a conversation in a noisy environment, and speaking to a friend when he is emotionally upset as 3 ("mostly effective"). Scoring 2, or "Somewhat effective" was having a conversation with the family member or friends at home, participating in conversation with strangers in a quiet place, conversing with a stranger over the telephone, and having a conversation while traveling in a car. He rated 1 ("not at all effective") having a conversation with someone at a distance, like across the room.  TODAY'S TREATMENT:  12/15/22: Pt produced loud /a/ with average mid 90s dB, everyday sentences were measured at average mid 80s dB. Conversational segments of 12 minutes req'd occasional min A for maintaining upper 60s dB speech.  SLP educated pt today about ways to refocus the loudness when he notices himself get soft; pt to try these outside [place] as well.  Pt is cont'ing to use strategies for recalling people's names successfully. SLP talked about pt scheduling two more sessions at once/week and pt thought this was a good idea.  12/08/22: Pt reports strategies for recalling people's names has been helpful since learning those 5 sessions ago. Pt produced loud /a/ with average mid 90s dB, everyday sentences were measured at average low-mid 80sdB with initial cues for louder speech. SLP reiterated that pt will want to have effort with /a/ in his mind when he talks in conversation. In conversation pt maintained upper 60s dB with occasional min cues for loudness.   12/04/22: Pt produced loud /a/ with average low-mid 90s dB, everyday sentences produced with average mid 80sdB with independence. In 2-5 minute conversational segments, pt req'd occasional cues for maintaining WNL loudness. With these cues, overall  average was in low 70s dB, however without cues pt's average would have been in mid 60s dB. Good use of abdominal breathing today in conversation.  12/01/22: Pt overheard in PT talking with suboptimal volume. Pt produced loud /a/ with average low-mid 90s dB, everyday sentences were measured at average mid 80sdB with initial cues for louder speech. In 2-3 minutes conversation today pt maintained loudness of mid 60's dB, with SLP cue to perform loud /a/ after 10 minutes. This was successful in pt's incr in loudness to WNL for 4 utterances then decr'd to mid 60s dB. Homework provided for 2-3 sentence responses with WNL volume.   11/27/22: Loud /a/ measurements average low-mid 90s dB, everyday sentences were measured at average mid 80sdB. In conversation segments pt maintained loudness in low 70s for first 8 minutes, faded into upper 60s dB for remainder with occasional min A for loudness.  11/24/22: "It's neat I can feel my diaphragm kick in with the /a/'s." SLP reinforced taht is what pt needs to feel with talking as well. "Oh, I know" pt stated.  Loud /a/ measurements average mid 90s dB, everyday sentences were measured at average low-mid 80sdB. SLP and pt talked in conversation for 5-7 minute segments and pt maintained upper 60s dB with occasional min A.   2/2/924:  SLP educated pt about memory strategies for people's names (WARM); pt used an example of a current name he couldn't recall and when he did he wrote it downa nd repeated it to himself 5x, upon direction from SLP.  Loud /a/ measurements average mid 90s dB, everyday sentences were measured at average low-mid 80sdB. Conversational segments of 5 minute intervals resulted in pt WNL loudness for first 3 segments and after this, pt req'd more intense and more frequent cueing for the next 3 segments. Pt self-corrected once in the first segment and once in the second segment but did not self-correct after that.  11/12/22: Loud /a/ measurements average  low-mid 90s dB, everyday sentences were measured at average low-mid 80s dB. Pt's conversation was suboptimal (mid-upper 60s) mostly due to loudness decay.In 3-4 minute conversational segments pt req'd occasional mod cues for loudness. Pt with throat clearing frequently until SLP educated pt on ramifications of throat clearing. He drank caffeine free diet drink instead for 4/5 opportunities.   11/03/22: Loud /a/ measurements average low-mid 90s dB, everyday sentences were measured at average low-mid 80s dB. Pt and SLP talked in 3 minute conversational segments in mod complex conversation with pt having upper 60s dB.  10/29/22: Loud /a/ measurements average mid 90s dB, everyday sentences were measured at average mid-80s dB. In sentence tasks pt req'd rare min A for loudness decay. Pt and SLP talked in mod complex conversation with pt having upper 60s dB for 12 minutes.  10/27/22: Loud /a/ measurements average mid 90s dB, everyday sentences were at mid-80s dB. In sentence tasks pt req'd occasional min-mod A for loudness decay faded to rare min -mod A for loudness decay. SLP encouraged pt to focus on his effort with loud /a/. In 2-3 minutes conversation segments pt req'd occasional mod A for maintaining loudness.   PATIENT EDUCATION: Education details: see above in "today's treatment" Person educated: Patient Education method: Explanation, Demonstration, and Verbal cues Education comprehension: verbalized understanding and return demonstrated   HOME EXERCISE PROGRAM: Loud /a/ x6 BID, and everyday sentences.   GOALS: Goals reviewed with patient? Yes  SHORT TERM GOALS: Target date: 11/13/22    pt will produce loud /a/ with at least low 90s dB average over three sessions Baseline: 10/27/22, 10/29/22 Goal status: Met  2.  Pt will produce 16/20 sentence responses with 70dB over two sessions Baseline: 10/29/22 Goal status: Met  3.  Pt will produce 5 minutes simple conversation with volume upper 60s dB  average over three sessions Baseline:  Goal status: Not met  4.  pt will generate abdominal breathing 80% of the time when engaging in 5 minutes simple conversation in 2 sessions Baseline:  Goal status: partially met    LONG TERM GOALS: Target date: 12/18/22    Pt will score higher than 18/32 on CES in the final two ST sessions Baseline:  Goal status: Ongoing  2.  Pt will report successful usage of strategies for recall of people's names in 2 sessions Baseline: 12/08/22 Goal status: Met  3.  Pt will maintain average of upper 60s dB over 10 minute simple-mod complex conversation with rare min A in three sessions  Baseline:  Goal status: Ongoing  4.  pt will use abdominal breathing 75% of the time in 10 minutes simple-mod complex conversation in three sessions  Baseline: 11/27/22, 12/08/22 Goal status: Met   ASSESSMENT:  CLINICAL IMPRESSION: Patient is a 72 y.o. male who was seen today for treatment of speech clarity who demonstrated mod  dysarthria and reduced breath support due to Parkinson's disease. See tx note from today. Louder speech carrying over into conversation easier than previous week. Pt would cont to benefit from skilled ST targeting loudness in conversation, and strategies for recalling people's names.  OBJECTIVE IMPAIRMENTS  Objective impairments include memory and dysarthria. These impairments are limiting patient from managing medications, household responsibilities, and effectively communicating at home and in community.Factors affecting potential to achieve goals and functional outcome are previous level of function and severity of impairments.. Patient will benefit from skilled SLP services to address above impairments and improve overall function.  REHAB POTENTIAL: Good  PLAN: SLP FREQUENCY: 2x/week  SLP DURATION: 8 weeks  PLANNED INTERVENTIONS: Environmental controls, Cueing hierachy, Internal/external aids, Functional tasks, Multimodal communication  approach, SLP instruction and feedback, and Compensatory strategies    Addalyn Speedy, CCC-SLP 12/15/2022, 3:15 PM

## 2022-12-18 ENCOUNTER — Ambulatory Visit: Payer: Medicare Other

## 2022-12-18 ENCOUNTER — Ambulatory Visit: Payer: Medicare Other | Admitting: Physical Therapy

## 2022-12-18 ENCOUNTER — Encounter: Payer: Self-pay | Admitting: Physical Therapy

## 2022-12-18 DIAGNOSIS — R41841 Cognitive communication deficit: Secondary | ICD-10-CM

## 2022-12-18 DIAGNOSIS — R293 Abnormal posture: Secondary | ICD-10-CM

## 2022-12-18 DIAGNOSIS — R2681 Unsteadiness on feet: Secondary | ICD-10-CM | POA: Diagnosis not present

## 2022-12-18 DIAGNOSIS — R2689 Other abnormalities of gait and mobility: Secondary | ICD-10-CM | POA: Diagnosis not present

## 2022-12-18 DIAGNOSIS — R471 Dysarthria and anarthria: Secondary | ICD-10-CM

## 2022-12-18 DIAGNOSIS — R29818 Other symptoms and signs involving the nervous system: Secondary | ICD-10-CM | POA: Diagnosis not present

## 2022-12-18 NOTE — Therapy (Signed)
OUTPATIENT PHYSICAL THERAPY NEURO TREATMENT/DISCHARGE SUMMARY   Patient Name: [name] [name] MRN: [id number] DOB:[birth year], 72 y.o., male Today's Date: 11/27/2022   PCP: Riki Sheer, DO REFERRING PROVIDER: Alonza Bogus, DO  PHYSICAL THERAPY DISCHARGE SUMMARY  Visits from Start of Care: 14  Current functional level related to goals / functional outcomes: See below.  Pt has met 4 of 4 LTGs.   Remaining deficits: Posture, high level balance   Education / Equipment: Educated in HEP progression; pt aware of and verbalizes plans to try to rejoin community [employer]! Moves class.   Patient agrees to discharge. Patient goals were met. Patient is being discharged due to meeting the stated rehab goals.  Mady Haagensen, PT 12/18/22 3:02 PM Phone: [phone] Fax: [phone]     END OF SESSION:  PT End of Session - 12/18/22 1358     Visit Number 14    Number of Visits 15    Date for PT Re-Evaluation 12/19/22    Authorization Type Medicare/BCBS    Progress Note Due on Visit 17   (PN was done at visit 7)   PT Start Time 1400    PT Stop Time 1447    PT Time Calculation (min) 47 min    Activity Tolerance Patient tolerated treatment well    Behavior During Therapy [facility] for tasks assessed/performed               Past Medical History:  Diagnosis Date   Cervical lymphadenopathy    right - followed by ENT   DOE (dyspnea on exertion) 07/15/2018   Dysphagia, neurologic 07/27/2014   Dystonia 1994   diagnosed in [place]   Enlarged lymph nodes 07/28/2007   Gastroesophageal reflux disease 03/07/2020   Localized swelling of right lower extremity 04/12/2019   Low back pain 11/21/2011   Meige syndrome (blepharospasm with oromandibular dystonia) 07/27/2014   Mild neurocognitive disorder due to Parkinson's disease 01/10/2022   Non-Hodgkin lymphoma 2007   diffuse- 6 cycles of chemo with R CHOP Rituxan - last dose 10/08   Nonspecific elevation of levels of transaminase or lactic  acid dehydrogenase (LDH) 07/06/2008   Parkinson's disease 07/27/2014   Post-splenectomy 04/02/2015   Past Surgical History:  Procedure Laterality Date   CHOLECYSTECTOMY, LAPAROSCOPIC     EYE SURGERY     x2 as child   PORT-A-CATH REMOVAL     PORTACATH PLACEMENT     SPLENECTOMY     TONSILLECTOMY     Patient Active Problem List   Diagnosis Date Noted   Mild neurocognitive disorder due to Parkinson's disease 01/10/2022   Gastroesophageal reflux disease 03/07/2020   Localized swelling of right lower extremity 04/12/2019   DOE (dyspnea on exertion) 07/15/2018   10 year risk of MI or stroke 7.5% or greater 04/12/2018   Post-splenectomy 04/02/2015   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/27/2014   Dysphagia, neurologic 07/27/2014   Parkinson's disease 07/27/2014   Dystonia 07/10/2014   Low back pain 11/21/2011   Nonspecific elevation of levels of transaminase or lactic acid dehydrogenase (LDH) 07/06/2008   Enlarged lymph nodes 07/28/2007   Non-Hodgkin lymphoma 03/23/2007    ONSET DATE: 09/08/2022 (MD referral)  REFERRING DIAG: G20.A1 (ICD-10-CM) - Parkinson's disease without dyskinesia or fluctuating manifestations   THERAPY DIAG:  Unsteadiness on feet  Other abnormalities of gait and mobility  Abnormal posture  Rationale for Evaluation and Treatment: Rehabilitation  SUBJECTIVE:  SUBJECTIVE STATEMENT: Nothing new.    Pt accompanied by: self  PERTINENT HISTORY: See PMH above  PAIN:  Are you having pain? Yes: NPRS scale: 2/10 Pain location: low back Pain description: soreness Aggravating factors: standing, walking too long Relieving factors: marching in place, sitting  PRECAUTIONS: Fall  WEIGHT BEARING RESTRICTIONS: No  FALLS: Has patient fallen in last 6  months? Yes. Number of falls 1  LIVING ENVIRONMENT: Lives with: lives with their family and lives with their spouse Lives in: House/apartment Stairs:  full flight to 2nd floor with bedroom and additional flight to 3rd floor man cave Has following equipment at home: Single point cane and Walker - 2 wheeled  PLOF: Independent  Not doing exercises and has not been attending [employer]! Moves class  PATIENT GOALS: Getting more strength back  OBJECTIVE:   Initiated session with several moves to help lessen back pain:  seated PWR! Up x 3 reps, standing modified quadruped ant/posterior weightshift through hips.  Marching in place (pt reports this helped him last night).  Discussed using varied positions of movement/gentle stretches throughout the day to help lessen back pain.  TODAY'S TREATMENT: 12/18/2022 Activity Comments  5x sit<>stand:  7.94 sec  3 sets 1 episode of retropulsion; overall better eccentric control into sitting; improved from 10.87 sec  TUG:  10.62 sec TUG cognitive:  12.03 sec Improved from 11.97 sec  62M walk: 8.53 sec = 3.85 ft/sec 62M fast:  5.85 sec = 5.61 ft/sec Improved from 2.81 ft/sec  MiniBEST:  24/28 Improved from 22/28  57M backward walk:  5.28 sec   360 turn to L 9 steps, 2nd trial 8 steps; then to R 8 steps Cues for quarter turn to help with foot clearance; pt performs better on second trial to L side  Reviewed gastroc stretch at steps Pt performs single foot prop at step and backward lean bilateral feet off step.  He seems to prefer single foot prop   PATIENT EDUCATION: Education details: PT POC and progress towards goals, plans for d/c this visit and return eval in 6-9 months.  Discussed importance of consistent HEP performance; pt may re-join [employer]! Moves class on Wednesdays Person educated: Patient Education method: Explanation Education comprehension: verbalized understanding    Access Code: [email or web address] URL: [email or web address] Date:  12/15/2022 Prepared by: [facility]  Exercises - Seated Cervical Retraction  - 1 x daily - 7 x weekly - 5-10 reps - 3 sec hold - Supine Passive Cervical Retraction  - 1 x daily - 7 x weekly - 10 reps - 3 sec hold - Sit to Stand Without Arm Support  - 1 x daily - 7 x weekly - 5 sets - 5 reps - Scapular Retraction with Resistance  - 1 x daily - 5 x weekly - 2-3 sets - 10 reps - Doorway Pec Stretch at 60 Elevation  - 1 x daily - 5 x weekly - 1 sets - 3 reps - 15 sec hold - Forward Step Up  - 1 x daily - 5 x weekly - 2 sets - 10 reps - Standing Weight Shift  - 1 x daily - 7 x weekly - 1-2 sets - 5 reps - Marching forward  - 1 x daily - 7 x weekly - 1 sets - 4 reps - Standing Bilateral Gastroc Stretch with Step  - 1-2 x daily - 7 x weekly - 1 sets - 3 reps - 15-30 sec hold  Provided handout  for multi-directional stepping activities at home         -------------------------------------------------------------- Objective measures below taken at initial evaluation:  DIAGNOSTIC FINDINGS: NA  COGNITION: Overall cognitive status: Within functional limits for tasks assessed.  Per neurocognitive testing in 2023:  [facility] *Lower voice volume noted in eval conversations*  SENSATION: NT  POSTURE: rounded shoulders, forward head, and posterior pelvic tilt  Leans to right in unsupported sitting.  LOWER EXTREMITY ROM:   WFL A/ROM, seated position; P/ROM notes tightness bilateral hamstrings   LOWER EXTREMITY MMT:    MMT Right Eval Left Eval  Hip flexion 4 4+  Hip extension    Hip abduction    Hip adduction    Hip internal rotation    Hip external rotation    Knee flexion 4 4  Knee extension 4 4  Ankle dorsiflexion 3+ 3+  Ankle plantarflexion    Ankle inversion    Ankle eversion    (Blank rows = not tested)    TRANSFERS: Assistive device utilized: None  Sit to stand: CGA Stand to sit: CGA  STAIRS: Level of Assistance: Modified  independence Stair Negotiation Technique: Alternating Pattern  with Bilateral Rails Number of Stairs: 3  Height of Stairs: 4"-6"  Comments: Pt does report that at home he has to change from step through to step-to pattern about half way up the steps, due to leg weakness.  GAIT: Gait pattern: step through pattern, shuffling, festinating, poor foot clearance- Right, and poor foot clearance- Left Distance walked: 50 ft x 4 Assistive device utilized: None Level of assistance: SBA Comments: Festinating noted at doorways and with turns to change direction  FUNCTIONAL TESTS:  5 times sit to stand: 10.87 sec, arms crossed at chest, retropulsion  Timed up and go (TUG): 11.97 sec 10 meter walk test: 11.68 sec = 2.81 ft/sec TUG cognitive:  12.03 sec TUG manual:  11.78 sec        GOALS: Goals reviewed with patient? Yes  SHORT TERM GOALS: Target date: 11/07/2022  Pt will be independent with HEP for improved balance, gait, functional strength. Baseline:  Has previously demonstrated Goal status: GOAL MET, 11/06/2022  2.  Pt will verbalize tips to reduce freezing/festination with gait, doorways, and turns.  Baseline:  Goal status: GOAL MET, 11/06/2022  3.  Pt will negotiate full flight of steps with handrail with step through pattern, to demonstrate improved functional strength. Baseline: reports half step-through and half step-to pattern; per report 11/06/2022, full stair flight Goal status: GOAL MET, 11/06/2022  LONG TERM GOALS: Target date: 11/21/2022>UPDATED to 12/19/2022  Pt will be independent with HEP for improved strength, balance, gait. Baseline: continueing to progress HEP Goal status: GOAL MET, 12/18/2022  2.  Pt will improve MiniBESTest score to at least 24/28 to decrease fall risk. Baseline:  22/28>24/28 12/18/2022 Goal status: GOAL MET, 12/18/2022  3.  Pt will perform 5x sit<>stand, 2 of 3 trials with no retropulsion, to demonstrate improved transfer efficiency and functional  lower extremity strength. Baseline: Performed 2 trials with pt having retropulsion 1 trial Goal status: GOAL MET, 12/18/2022  4.  Pt will report 50% improvement in sit<>stand from low surfaces in home. Baseline: reports 90% improvement Goal status: GOAL MET, 12/18/2022  ASSESSMENT:  CLINICAL IMPRESSION: Assessed LTGs this visit, with pt meeting 4 of 4 LTGs.  He also has demonstrated improved gait velocity, improved TUG scores, and improved 5x sit<>stand times.  He has consistently reported improved strength with improved stair negotiation, with objective testing today, he  demo improved functional strength as well as improved balance.  He has comprehensive exercise program and PT educated pt again on importance of consistency of HEP performance.  He also reports plans to re-join [employer]! Moves community class.  He is appropriate for discharge from PT this visit, with plans for return eval in 6-9 months due to progressive nature of disease.     OBJECTIVE IMPAIRMENTS: Abnormal gait, decreased balance, decreased knowledge of use of DME, decreased mobility, difficulty walking, decreased strength, impaired flexibility, and postural dysfunction.   ACTIVITY LIMITATIONS: standing, stairs, transfers, locomotion level, and caring for others  PARTICIPATION LIMITATIONS: community activity and hobbies  PERSONAL FACTORS: 3+ comorbidities: PMH above  are also affecting patient's functional outcome.   REHAB POTENTIAL: Good  CLINICAL DECISION MAKING: Evolving/moderate complexity  EVALUATION COMPLEXITY: Moderate  PLAN:  PT FREQUENCY: 2x/week  PT DURATION: 4 weeks per recert [id number] visit  PLANNED INTERVENTIONS: Therapeutic exercises, Therapeutic activity, Neuromuscular re-education, Balance training, Gait training, Patient/Family education, Self Care, Stair training, and DME instructions  PLAN FOR NEXT SESSION: Discharge this visit; plan return eval in 6-9 months.  Mady Haagensen, PT 12/18/22 3:01  PM Phone: [phone] Fax: [phone]  [facility] at [facility] [place], [place], [place] [address] Phone # [phone] Fax # [phone]

## 2022-12-18 NOTE — Therapy (Signed)
OUTPATIENT SPEECH LANGUAGE PATHOLOGY PARKINSON'S TREATMENT/Recert   Patient Name: [name] [name] MRN: [id number] DOB:1951/04/07, 72 y.o., male Today's Date: 12/18/2022  PCP: Dr. Deloris Ping REFERRING PROVIDER: Alonza Bogus, DO   End of Session - 12/18/22 1502     Visit Number 15    Number of Visits 17    Date for SLP Re-Evaluation 01/23/23    SLP Start Time 1455    SLP Stop Time  [id number]    SLP Time Calculation (min) 37 min    Activity Tolerance Patient tolerated treatment well                    Past Medical History:  Diagnosis Date   Cervical lymphadenopathy    right - followed by ENT   DOE (dyspnea on exertion) 07/15/2018   Dysphagia, neurologic 07/27/2014   Dystonia 1994   diagnosed in [place]   Enlarged lymph nodes 07/28/2007   Gastroesophageal reflux disease 03/07/2020   Localized swelling of right lower extremity 04/12/2019   Low back pain 11/21/2011   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   Mild neurocognitive disorder due to Parkinson's disease 01/10/2022   Non-Hodgkin lymphoma 2007   diffuse- 6 cycles of chemo with R CHOP Rituxan - last dose 10/08   Nonspecific elevation of levels of transaminase or lactic acid dehydrogenase (LDH) 07/06/2008   Parkinson's disease 07/27/2014   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/02/2015   Past Surgical History:  Procedure Laterality Date   CHOLECYSTECTOMY, LAPAROSCOPIC     EYE SURGERY     x2 as child   PORT-A-CATH REMOVAL     PORTACATH PLACEMENT     SPLENECTOMY     TONSILLECTOMY     Patient Active Problem List   Diagnosis Date Noted   Mild neurocognitive disorder due to Parkinson's disease 01/10/2022   Gastroesophageal reflux disease 03/07/2020   Localized swelling of right lower extremity 04/12/2019   DOE (dyspnea on exertion) 07/15/2018   10 year risk of MI or stroke 7.5% or greater 04/12/2018   Post-splenectomy 04/02/2015   Meige syndrome (blepharospasm with oromandibular dystonia)  07/27/2014   Dysphagia, neurologic 07/27/2014   Parkinson's disease 07/27/2014   Dystonia 07/10/2014   Low back pain 11/21/2011   Nonspecific elevation of levels of transaminase or lactic acid dehydrogenase (LDH) 07/06/2008   Enlarged lymph nodes 07/28/2007   Non-Hodgkin lymphoma 03/23/2007   Speech Therapy Progress Note  Dates of Reporting Period: 12/01/22 to present  Subjective Statement: Pt has been seen for 15 visits for speech intelligibility/loudness  Objective: See below  Goal Update: See below.   Plan: Two more sessions until d/c.  Reason Skilled Services are Required: Ensure loudness persists over time - pt will be seen x1/week for < 5 visits.   ONSET DATE: Dx in mid-2010's  REFERRING DIAG: G20 - Parkinson's disease   THERAPY DIAG:  Dysarthria and anarthria  Cognitive communication deficit  Rationale for Evaluation and Treatment Rehabilitation  SUBJECTIVE:   SUBJECTIVE STATEMENT: "I can tell when I am too soft with my speech."  Pt accompanied by: self  PAIN:  Are you having pain? No   PATIENT GOALS: "I want to get back to where I was."  OBJECTIVE:   DIAGNOSTIC FINDINGS: From neuropsych note from April 2023: "Mr. Staple completed a comprehensive neuropsychological evaluation on 01/10/2022. Please refer to that encounter for the full report and recommendations. Briefly, results suggested isolated impaired performances across complex attention and semantic fluency. Performance variability was also exhibited across all aspects of learning  and memory. Regarding etiology, the most likely culprit for ongoing cognitive weakness remains his past history of Parkinson's disease. While processing speed was improved relative to what might be expected, dysfunction surrounding complex attention and encoding/retrieval deficits of memory are quite common in this illness. The fact that motor/gait dysfunction was noted years prior to concern surrounding cognitive decline is also  a typical timeline associated with Parkinson's disease. Mild anxiety and sleep dysfunction could further influence cognitive performances. Research surrounding cognitive side effects of pramipexole is mixed; however, I cannot rule out an ongoing side effect contribution as well."   PATIENT REPORTED OUTCOME MEASURES (PROM): Communication Effectiveness Survey: Pt scored 18/32 on this survey (pt scored 29/32 at discharge in August 2023).   Patient does not report any communication on the survey as 4 ("very effective"). He reports conversing with a familiar person over the telephone, being part of a conversation in a noisy environment, and speaking to a friend when he is emotionally upset as 3 ("mostly effective"). Scoring 2, or "Somewhat effective" was having a conversation with the family member or friends at home, participating in conversation with strangers in a quiet place, conversing with a stranger over the telephone, and having a conversation while traveling in a car. He rated 1 ("not at all effective") having a conversation with someone at a distance, like across the room.  TODAY'S TREATMENT:  12/18/22: Loud /a/ with average low-mid 90s dB. Everyday sentences produced with average mid 80sdB with independence. In 12 minutes and 10 minutes mod complex conversation, pt maintained upper 60s dB loudness with SBA. In another 11 minutes mod complex conversation pt had mid-upper 60s dB with SBA. AB was good throughout session today.  12/15/22: Pt produced loud /a/ with average mid 90s dB, everyday sentences were measured at average mid 80s dB. Conversational segments of 12 minutes req'd occasional min A for maintaining upper 60s dB speech.  SLP educated pt today about ways to refocus the loudness when he notices himself get soft; pt to try these outside [place] as well.  Pt is cont'ing to use strategies for recalling people's names successfully. SLP talked about pt scheduling two more sessions at once/week and pt  thought this was a good idea.  12/08/22: Pt reports strategies for recalling people's names has been helpful since learning those 5 sessions ago. Pt produced loud /a/ with average mid 90s dB, everyday sentences were measured at average low-mid 80sdB with initial cues for louder speech. SLP reiterated that pt will want to have effort with /a/ in his mind when he talks in conversation. In conversation pt maintained upper 60s dB with occasional min cues for loudness.   12/04/22: Pt produced loud /a/ with average low-mid 90s dB, everyday sentences produced with average mid 80sdB with independence. In 2-5 minute conversational segments, pt req'd occasional cues for maintaining WNL loudness. With these cues, overall average was in low 70s dB, however without cues pt's average would have been in mid 60s dB. Good use of abdominal breathing today in conversation.  12/01/22: Pt overheard in PT talking with suboptimal volume. Pt produced loud /a/ with average low-mid 90s dB, everyday sentences were measured at average mid 80sdB with initial cues for louder speech. In 2-3 minutes conversation today pt maintained loudness of mid 60's dB, with SLP cue to perform loud /a/ after 10 minutes. This was successful in pt's incr in loudness to WNL for 4 utterances then decr'd to mid 60s dB. Homework provided for 2-3 sentence responses with  WNL volume.   11/27/22: Loud /a/ measurements average low-mid 90s dB, everyday sentences were measured at average mid 80sdB. In conversation segments pt maintained loudness in low 70s for first 8 minutes, faded into upper 60s dB for remainder with occasional min A for loudness.  11/24/22: "It's neat I can feel my diaphragm kick in with the /a/'s." SLP reinforced taht is what pt needs to feel with talking as well. "Oh, I know" pt stated.  Loud /a/ measurements average mid 90s dB, everyday sentences were measured at average low-mid 80sdB. SLP and pt talked in conversation for 5-7 minute segments  and pt maintained upper 60s dB with occasional min A.   2/2/924: SLP educated pt about memory strategies for people's names (WARM); pt used an example of a current name he couldn't recall and when he did he wrote it downa nd repeated it to himself 5x, upon direction from SLP.  Loud /a/ measurements average mid 90s dB, everyday sentences were measured at average low-mid 80sdB. Conversational segments of 5 minute intervals resulted in pt WNL loudness for first 3 segments and after this, pt req'd more intense and more frequent cueing for the next 3 segments. Pt self-corrected once in the first segment and once in the second segment but did not self-correct after that.  11/12/22: Loud /a/ measurements average low-mid 90s dB, everyday sentences were measured at average low-mid 80s dB. Pt's conversation was suboptimal (mid-upper 60s) mostly due to loudness decay.In 3-4 minute conversational segments pt req'd occasional mod cues for loudness. Pt with throat clearing frequently until SLP educated pt on ramifications of throat clearing. He drank caffeine free diet drink instead for 4/5 opportunities.   11/03/22: Loud /a/ measurements average low-mid 90s dB, everyday sentences were measured at average low-mid 80s dB. Pt and SLP talked in 3 minute conversational segments in mod complex conversation with pt having upper 60s dB.  10/29/22: Loud /a/ measurements average mid 90s dB, everyday sentences were measured at average mid-80s dB. In sentence tasks pt req'd rare min A for loudness decay. Pt and SLP talked in mod complex conversation with pt having upper 60s dB for 12 minutes.  10/27/22: Loud /a/ measurements average mid 90s dB, everyday sentences were at mid-80s dB. In sentence tasks pt req'd occasional min-mod A for loudness decay faded to rare min -mod A for loudness decay. SLP encouraged pt to focus on his effort with loud /a/. In 2-3 minutes conversation segments pt req'd occasional mod A for maintaining  loudness.   PATIENT EDUCATION: Education details: see above in "today's treatment" Person educated: Patient Education method: Explanation, Demonstration, and Verbal cues Education comprehension: verbalized understanding and return demonstrated   HOME EXERCISE PROGRAM: Loud /a/ x6 BID, and everyday sentences.   GOALS: Goals reviewed with patient? Yes  SHORT TERM GOALS: Target date: 11/13/22    pt will produce loud /a/ with at least low 90s dB average over three sessions Baseline: 10/27/22, 10/29/22 Goal status: Met  2.  Pt will produce 16/20 sentence responses with 70dB over two sessions Baseline: 10/29/22 Goal status: Met  3.  Pt will produce 5 minutes simple conversation with volume upper 60s dB average over three sessions Baseline:  Goal status: Not met  4.  pt will generate abdominal breathing 80% of the time when engaging in 5 minutes simple conversation in 2 sessions Baseline:  Goal status: partially met    LONG TERM GOALS: Target date: 12/18/22    Pt will score higher than 18/32 on  CES in the final two ST sessions Baseline:  Goal status: Ongoing  2.  Pt will report successful usage of strategies for recall of people's names in 2 sessions Baseline: 12/08/22 Goal status: Met  3.  Pt will maintain average of upper 60s dB over 10 minute simple-mod complex conversation with rare min A in three sessions  Baseline: 12/18/22 Goal status: Ongoing  4.  pt will use abdominal breathing 75% of the time in 10 minutes simple-mod complex conversation in three sessions  Baseline: 11/27/22, 12/08/22 Goal status: Met   ASSESSMENT:  CLINICAL IMPRESSION: Patient is a 72 y.o. male who was seen today for treatment of speech clarity who demonstrated mod dysarthria and reduced breath support due to Parkinson's disease. See tx note from today. Louder speech cont carrying over into conversation. Pt would cont to benefit from skilled ST targeting loudness in conversation, and strategies for  recalling people's names.  OBJECTIVE IMPAIRMENTS  Objective impairments include memory and dysarthria. These impairments are limiting patient from managing medications, household responsibilities, and effectively communicating at home and in community.Factors affecting potential to achieve goals and functional outcome are previous level of function and severity of impairments.. Patient will benefit from skilled SLP services to address above impairments and improve overall function.  REHAB POTENTIAL: Good  PLAN: SLP FREQUENCY: 2x/week  SLP DURATION: 8 weeks  PLANNED INTERVENTIONS: Environmental controls, Cueing hierachy, Internal/external aids, Functional tasks, Multimodal communication approach, SLP instruction and feedback, and Compensatory strategies    Aviyah Swetz, CCC-SLP 12/18/2022, 3:43 PM

## 2022-12-24 ENCOUNTER — Ambulatory Visit: Payer: Medicare Other | Attending: Neurology

## 2022-12-24 DIAGNOSIS — R471 Dysarthria and anarthria: Secondary | ICD-10-CM

## 2022-12-24 DIAGNOSIS — R4701 Aphasia: Secondary | ICD-10-CM | POA: Diagnosis not present

## 2022-12-24 DIAGNOSIS — R41841 Cognitive communication deficit: Secondary | ICD-10-CM | POA: Diagnosis not present

## 2022-12-24 NOTE — Therapy (Signed)
OUTPATIENT SPEECH LANGUAGE PATHOLOGY PARKINSON'S TREATMENT   Patient Name: [name] [name] MRN: [id number] DOB:March 21, 1951, 72 y.o., male Today's Date: 12/24/2022  PCP: Dr. Deloris Ping REFERRING PROVIDER: Alonza Bogus, DO   End of Session - 12/24/22 1516     Visit Number 16    Number of Visits 17    Date for SLP Re-Evaluation 01/23/23    SLP Start Time 1455    SLP Stop Time  1530    SLP Time Calculation (min) 35 min    Activity Tolerance Patient tolerated treatment well                    Past Medical History:  Diagnosis Date   Cervical lymphadenopathy    right - followed by ENT   DOE (dyspnea on exertion) 07/15/2018   Dysphagia, neurologic 07/27/2014   Dystonia 1994   diagnosed in [place]   Enlarged lymph nodes 07/28/2007   Gastroesophageal reflux disease 03/07/2020   Localized swelling of right lower extremity 04/12/2019   Low back pain 11/21/2011   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   Mild neurocognitive disorder due to Parkinson's disease 01/10/2022   Non-Hodgkin lymphoma 2007   diffuse- 6 cycles of chemo with R CHOP Rituxan - last dose 10/08   Nonspecific elevation of levels of transaminase or lactic acid dehydrogenase (LDH) 07/06/2008   Parkinson's disease 07/27/2014   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   Past Surgical History:  Procedure Laterality Date   CHOLECYSTECTOMY, LAPAROSCOPIC     EYE SURGERY     x2 as child   PORT-A-CATH REMOVAL     PORTACATH PLACEMENT     SPLENECTOMY     TONSILLECTOMY     Patient Active Problem List   Diagnosis Date Noted   Mild neurocognitive disorder due to Parkinson's disease 01/10/2022   Gastroesophageal reflux disease 03/07/2020   Localized swelling of right lower extremity 04/12/2019   DOE (dyspnea on exertion) 07/15/2018   10 year risk of MI or stroke 7.5% or greater 04/12/2018   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  Rationale for Evaluation and Treatment Rehabilitation  SUBJECTIVE:   SUBJECTIVE STATEMENT: Pt relates his speech is better at home and with family elsewhere.  Pt accompanied by: self  PAIN:  Are you having pain? No   PATIENT GOALS: "I want to get back to where I was."  OBJECTIVE:   DIAGNOSTIC FINDINGS: From neuropsych note from April 2023: "Mr. Sport completed a comprehensive neuropsychological evaluation on 01/10/2022. Please refer to that encounter for the full report and recommendations. Briefly, results suggested isolated impaired performances across complex attention and semantic fluency. Performance variability was also exhibited across all aspects of learning and memory. Regarding etiology, the most likely culprit for ongoing cognitive weakness remains his past history of Parkinson's disease. While processing speed was improved relative to what might be expected, dysfunction surrounding complex attention and encoding/retrieval deficits of memory are quite common in this illness. The fact that motor/gait dysfunction was noted years prior to concern surrounding cognitive decline is also a typical timeline associated with  Parkinson's disease. Mild anxiety and sleep dysfunction could further influence cognitive performances. Research surrounding cognitive side effects of pramipexole is mixed; however, I cannot rule out an ongoing side effect contribution as well."   PATIENT REPORTED OUTCOME MEASURES (PROM): Communication Effectiveness Survey: Pt scored 18/32 on this survey (pt scored 29/32  at discharge in August 2023).   Patient does not report any communication on the survey as 4 ("very effective"). He reports conversing with a familiar person over the telephone, being part of a conversation in a noisy environment, and speaking to a friend when he is emotionally upset as 3 ("mostly effective"). Scoring 2, or "Somewhat effective" was having a conversation with the family member or friends at home, participating in conversation with strangers in a quiet place, conversing with a stranger over the telephone, and having a conversation while traveling in a car. He rated 1 ("not at all effective") having a conversation with someone at a distance, like across the room.  TODAY'S TREATMENT:  12/24/22: Notable that pt next dose of PD meds is 1530, the time of this session's completion. Loud /a/ with average low 90s dB. Everyday sentences produced with average low 80s dB with independence. In conversation of 12 minutes, 15 minutes and 10 minutes SLP had to aske pt to repeat x1 each conversation due to imprecise, hurried  articulation. Pt volume faded from mid-upper 60s to first 30 minutes to mid 60s dB for mid 60s dB average overall. No incidences of anomia present today in conversation.  12/18/22: Loud /a/ with average low-mid 90s dB. Everyday sentences produced with average mid 80sdB with independence. In 12 minutes and 10 minutes mod complex conversation, pt maintained upper 60s dB loudness with SBA. In another 11 minutes mod complex conversation pt had mid-upper 60s dB with SBA. AB was good throughout session today.  12/15/22: Pt produced loud /a/ with average mid 90s dB, everyday sentences were measured at average mid 80s dB. Conversational segments of 12 minutes req'd occasional min A for maintaining upper 60s dB speech.  SLP educated pt today about ways to refocus the loudness when he notices himself get soft; pt to try these outside [place] as well.  Pt is cont'ing to use strategies for recalling  people's names successfully. SLP talked about pt scheduling two more sessions at once/week and pt thought this was a good idea.  12/08/22: Pt reports strategies for recalling people's names has been helpful since learning those 5 sessions ago. Pt produced loud /a/ with average mid 90s dB, everyday sentences were measured at average low-mid 80sdB with initial cues for louder speech. SLP reiterated that pt will want to have effort with /a/ in his mind when he talks in conversation. In conversation pt maintained upper 60s dB with occasional min cues for loudness.   12/04/22: Pt produced loud /a/ with average low-mid 90s dB, everyday sentences produced with average mid 80sdB with independence. In 2-5 minute conversational segments, pt req'd occasional cues for maintaining WNL loudness. With these cues, overall average was in low 70s dB, however without cues pt's average would have been in mid 60s dB. Good use of abdominal breathing today in conversation.  12/01/22: Pt overheard in PT talking with suboptimal volume. Pt produced loud /a/ with average low-mid 90s dB, everyday sentences were measured at average mid 80sdB with initial cues for louder speech. In 2-3 minutes conversation today pt maintained loudness of mid 60's dB, with SLP cue to perform loud /a/ after 10 minutes. This was successful  in pt's incr in loudness to WNL for 4 utterances then decr'd to mid 60s dB. Homework provided for 2-3 sentence responses with WNL volume.   11/27/22: Loud /a/ measurements average low-mid 90s dB, everyday sentences were measured at average mid 80sdB. In conversation segments pt maintained loudness in low 70s for first 8 minutes, faded into upper 60s dB for remainder with occasional min A for loudness.  11/24/22: "It's neat I can feel my diaphragm kick in with the /a/'s." SLP reinforced taht is what pt needs to feel with talking as well. "Oh, I know" pt stated.  Loud /a/ measurements average mid 90s dB, everyday sentences  were measured at average low-mid 80sdB. SLP and pt talked in conversation for 5-7 minute segments and pt maintained upper 60s dB with occasional min A.   2/2/924: SLP educated pt about memory strategies for people's names (WARM); pt used an example of a current name he couldn't recall and when he did he wrote it downa nd repeated it to himself 5x, upon direction from SLP.  Loud /a/ measurements average mid 90s dB, everyday sentences were measured at average low-mid 80sdB. Conversational segments of 5 minute intervals resulted in pt WNL loudness for first 3 segments and after this, pt req'd more intense and more frequent cueing for the next 3 segments. Pt self-corrected once in the first segment and once in the second segment but did not self-correct after that.  11/12/22: Loud /a/ measurements average low-mid 90s dB, everyday sentences were measured at average low-mid 80s dB. Pt's conversation was suboptimal (mid-upper 60s) mostly due to loudness decay.In 3-4 minute conversational segments pt req'd occasional mod cues for loudness. Pt with throat clearing frequently until SLP educated pt on ramifications of throat clearing. He drank caffeine free diet drink instead for 4/5 opportunities.   11/03/22: Loud /a/ measurements average low-mid 90s dB, everyday sentences were measured at average low-mid 80s dB. Pt and SLP talked in 3 minute conversational segments in mod complex conversation with pt having upper 60s dB.  10/29/22: Loud /a/ measurements average mid 90s dB, everyday sentences were measured at average mid-80s dB. In sentence tasks pt req'd rare min A for loudness decay. Pt and SLP talked in mod complex conversation with pt having upper 60s dB for 12 minutes.  10/27/22: Loud /a/ measurements average mid 90s dB, everyday sentences were at mid-80s dB. In sentence tasks pt req'd occasional min-mod A for loudness decay faded to rare min -mod A for loudness decay. SLP encouraged pt to focus on his effort with  loud /a/. In 2-3 minutes conversation segments pt req'd occasional mod A for maintaining loudness.   PATIENT EDUCATION: Education details: see above in "today's treatment" Person educated: Patient Education method: Explanation, Demonstration, and Verbal cues Education comprehension: verbalized understanding and return demonstrated  GOALS: Goals reviewed with patient? Yes  SHORT TERM GOALS: Target date: 11/13/22    pt will produce loud /a/ with at least low 90s dB average over three sessions Baseline: 10/27/22, 10/29/22 Goal status: Met  2.  Pt will produce 16/20 sentence responses with 70dB over two sessions Baseline: 10/29/22 Goal status: Met  3.  Pt will produce 5 minutes simple conversation with volume upper 60s dB average over three sessions Baseline:  Goal status: Not met  4.  pt will generate abdominal breathing 80% of the time when engaging in 5 minutes simple conversation in 2 sessions Baseline:  Goal status: partially met    LONG TERM GOALS: Target date: 12/18/22  Pt will score higher than 18/32 on CES in the final two ST sessions Baseline:  Goal status: Ongoing  2.  Pt will report successful usage of strategies for recall of people's names in 2 sessions Baseline: 12/08/22 Goal status: Met  3.  Pt will maintain average of upper 60s dB over 10 minute simple-mod complex conversation with rare min A in three sessions  Baseline: 12/18/22, 12-24-22 Goal status: Ongoing  4.  pt will use abdominal breathing 75% of the time in 10 minutes simple-mod complex conversation in three sessions  Baseline: 11/27/22, 12/08/22 Goal status: Met   ASSESSMENT:  CLINICAL IMPRESSION: Patient is a 72 y.o. male who was seen today for treatment of speech clarity who demonstrated mod dysarthria and reduced breath support due to Parkinson's disease. See tx note from today. Louder speech cont carrying over into conversation. Pt would cont to benefit from skilled ST targeting loudness in  conversation, and strategies for recalling people's names. Discharge likely next session.  OBJECTIVE IMPAIRMENTS  Objective impairments include memory and dysarthria. These impairments are limiting patient from managing medications, household responsibilities, and effectively communicating at home and in community.Factors affecting potential to achieve goals and functional outcome are previous level of function and severity of impairments.. Patient will benefit from skilled SLP services to address above impairments and improve overall function.  REHAB POTENTIAL: Good  PLAN: SLP FREQUENCY: 2x/week  SLP DURATION: 8 weeks  PLANNED INTERVENTIONS: Environmental controls, Cueing hierachy, Internal/external aids, Functional tasks, Multimodal communication approach, SLP instruction and feedback, and Compensatory strategies    Izmael Duross, CCC-SLP 12/24/2022, 3:16 PM

## 2022-12-31 ENCOUNTER — Ambulatory Visit: Payer: Medicare Other

## 2022-12-31 DIAGNOSIS — R41841 Cognitive communication deficit: Secondary | ICD-10-CM | POA: Diagnosis not present

## 2022-12-31 DIAGNOSIS — R471 Dysarthria and anarthria: Secondary | ICD-10-CM | POA: Diagnosis not present

## 2022-12-31 DIAGNOSIS — R4701 Aphasia: Secondary | ICD-10-CM | POA: Diagnosis not present

## 2022-12-31 NOTE — Therapy (Signed)
OUTPATIENT SPEECH LANGUAGE PATHOLOGY PARKINSON'S TREATMENT/DISCHARGE SUMMARY   Patient Name: DAKOTAH COHEA MRN: [id number] DOB:1950-10-18, 72 y.o., male Today's Date: 12/31/2022  PCP: Dr. Zipporah Plants REFERRING PROVIDER: Kerin Salen, DO   End of Session - 12/31/22 1410     Visit Number 17    Number of Visits 17    Date for SLP Re-Evaluation 01/23/23    SLP Start Time 1405    SLP Stop Time  1445    SLP Time Calculation (min) 40 min    Activity Tolerance Patient tolerated treatment well                    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/27/2014   Mild neurocognitive disorder due to Parkinson's disease 01/10/2022   Non-Hodgkin lymphoma 2007   diffuse- 6 cycles of chemo with R CHOP Rituxan - last dose 10/08   Nonspecific elevation of levels of transaminase or lactic acid dehydrogenase (LDH) 07/06/2008   Parkinson's disease 07/27/2014   Post-splenectomy 04/02/2015   Past Surgical History:  Procedure Laterality Date   CHOLECYSTECTOMY, LAPAROSCOPIC     EYE SURGERY     x2 as child   PORT-A-CATH REMOVAL     PORTACATH PLACEMENT     SPLENECTOMY     TONSILLECTOMY     Patient Active Problem List   Diagnosis Date Noted   Mild neurocognitive disorder due to Parkinson's disease 01/10/2022   Gastroesophageal reflux disease 03/07/2020   Localized swelling of right lower extremity 04/12/2019   DOE (dyspnea on exertion) 07/15/2018   10 year risk of MI or stroke 7.5% or greater 04/12/2018   Post-splenectomy 04/02/2015   Meige syndrome (blepharospasm with oromandibular  dystonia) 07/27/2014   Dysphagia, neurologic 07/27/2014   Parkinson's disease 07/27/2014   Dystonia 07/10/2014   Low back pain 11/21/2011   Nonspecific elevation of levels of transaminase or lactic acid dehydrogenase (LDH) 07/06/2008   Enlarged lymph nodes 07/28/2007   Non-Hodgkin lymphoma 03/23/2007   SPEECH THERAPY DISCHARGE SUMMARY  Visits from Start of Care: 17  Current functional level related to goals / functional outcomes: Pt met   Remaining deficits: Dysarthria, high leevel cognitive deficits.   Education / Equipment: How to schedule his HEP.   Patient agrees to discharge. Patient goals were met. Patient is being discharged due to meeting the stated rehab goals. He will be re-evaluatee in approx two months.  ONSET DATE: Dx in mid-2010's  REFERRING DIAG: G20 - Parkinson's disease   THERAPY DIAG:  Dysarthria and anarthria  Cognitive communication deficit  Rationale for Evaluation and Treatment Rehabilitation  SUBJECTIVE:   SUBJECTIVE STATEMENT: Pt relates his speech is better at home and with family elsewhere.  Pt accompanied by: self  PAIN:  Are you having pain? No   PATIENT GOALS: "I want to get back to where I was."  OBJECTIVE:   DIAGNOSTIC FINDINGS: From neuropsych note from April 2023: "Mr. Kehoe completed a comprehensive neuropsychological evaluation on 01/10/2022. Please refer to that encounter for the full report and recommendations. Briefly, results suggested isolated impaired performances across complex attention and semantic fluency. Performance  variability was also exhibited across all aspects of learning and memory. Regarding etiology, the most likely culprit for ongoing cognitive weakness remains his past history of Parkinson's disease. While processing speed was improved relative to what might be expected, dysfunction surrounding complex attention and encoding/retrieval deficits of memory are quite common in this illness. The fact that motor/gait  dysfunction was noted years prior to concern surrounding cognitive decline is also a typical timeline associated with Parkinson's disease. Mild anxiety and sleep dysfunction could further influence cognitive performances. Research surrounding cognitive side effects of pramipexole is mixed; however, I cannot rule out an ongoing side effect contribution as well."   PATIENT REPORTED OUTCOME MEASURES (PROM): Communication Effectiveness Survey: Pt scored 18/32 on this survey (pt scored 29/32 at discharge in August 2023).   Patient does not report any communication on the survey as 4 ("very effective"). He reports conversing with a familiar person over the telephone, being part of a conversation in a noisy environment, and speaking to a friend when he is emotionally upset as 3 ("mostly effective"). Scoring 2, or "Somewhat effective" was having a conversation with the family member or friends at home, participating in conversation with strangers in a quiet place, conversing with a stranger over the telephone, and having a conversation while traveling in a car. He rated 1 ("not at all effective") having a conversation with someone at a distance, like across the room.  TODAY'S TREATMENT:  12/31/22: Pt administered CES and scored Pt reported today that server at [employer] understood him today when he used louder speech. Loud /a/ completed with average low-mid 90s dB. Everyday sentences produced with average low-mid 80s dB with independence. Conversation prior to loud /a/ was upper 60s - low 70s dB. Conversation outdoors was 100% intelligible with rare rushes of speech. Pt is satisfied with progress and agrees with d/c today.   12/24/22: Notable that pt next dose of PD meds is 1530, the time of this session's completion. Loud /a/ with average low 90s dB. Everyday sentences produced with average low 80s dB with independence. In conversation of 12 minutes, 15 minutes and 10 minutes SLP had to aske pt to repeat x1 each  conversation due to imprecise, hurried  articulation. Pt volume faded from mid-upper 60s to first 30 minutes to mid 60s dB for mid 60s dB average overall. No incidences of anomia present today in conversation.  12/18/22: Loud /a/ with average low-mid 90s dB. Everyday sentences produced with average mid 80sdB with independence. In 12 minutes and 10 minutes mod complex conversation, pt maintained upper 60s dB loudness with SBA. In another 11 minutes mod complex conversation pt had mid-upper 60s dB with SBA. AB was good throughout session today.  12/15/22: Pt produced loud /a/ with average mid 90s dB, everyday sentences were measured at average mid 80s dB. Conversational segments of 12 minutes req'd occasional min A for maintaining upper 60s dB speech.  SLP educated pt today about ways to refocus the loudness when he notices himself get soft; pt to try these outside ST as well.  Pt is cont'ing to use strategies for recalling people's names successfully. SLP talked about pt scheduling two more sessions at once/week and pt thought this was a good idea.  12/08/22: Pt reports strategies for recalling people's names has been helpful since learning those 5 sessions ago. Pt produced loud /a/ with average mid 90s dB, everyday sentences were measured at average low-mid 80sdB with initial cues for louder speech. SLP reiterated that pt will want  to have effort with /a/ in his mind when he talks in conversation. In conversation pt maintained upper 60s dB with occasional min cues for loudness.   12/04/22: Pt produced loud /a/ with average low-mid 90s dB, everyday sentences produced with average mid 80sdB with independence. In 2-5 minute conversational segments, pt req'd occasional cues for maintaining WNL loudness. With these cues, overall average was in low 70s dB, however without cues pt's average would have been in mid 60s dB. Good use of abdominal breathing today in conversation.  12/01/22: Pt overheard in PT talking  with suboptimal volume. Pt produced loud /a/ with average low-mid 90s dB, everyday sentences were measured at average mid 80sdB with initial cues for louder speech. In 2-3 minutes conversation today pt maintained loudness of mid 60's dB, with SLP cue to perform loud /a/ after 10 minutes. This was successful in pt's incr in loudness to WNL for 4 utterances then decr'd to mid 60s dB. Homework provided for 2-3 sentence responses with WNL volume.   11/27/22: Loud /a/ measurements average low-mid 90s dB, everyday sentences were measured at average mid 80sdB. In conversation segments pt maintained loudness in low 70s for first 8 minutes, faded into upper 60s dB for remainder with occasional min A for loudness.  11/24/22: "It's neat I can feel my diaphragm kick in with the /a/'s." SLP reinforced taht is what pt needs to feel with talking as well. "Oh, I know" pt stated.  Loud /a/ measurements average mid 90s dB, everyday sentences were measured at average low-mid 80sdB. SLP and pt talked in conversation for 5-7 minute segments and pt maintained upper 60s dB with occasional min A.   2/2/924: SLP educated pt about memory strategies for people's names (WARM); pt used an example of a current name he couldn't recall and when he did he wrote it downa nd repeated it to himself 5x, upon direction from SLP.  Loud /a/ measurements average mid 90s dB, everyday sentences were measured at average low-mid 80sdB. Conversational segments of 5 minute intervals resulted in pt WNL loudness for first 3 segments and after this, pt req'd more intense and more frequent cueing for the next 3 segments. Pt self-corrected once in the first segment and once in the second segment but did not self-correct after that.  11/12/22: Loud /a/ measurements average low-mid 90s dB, everyday sentences were measured at average low-mid 80s dB. Pt's conversation was suboptimal (mid-upper 60s) mostly due to loudness decay.In 3-4 minute conversational segments  pt req'd occasional mod cues for loudness. Pt with throat clearing frequently until SLP educated pt on ramifications of throat clearing. He drank caffeine free diet drink instead for 4/5 opportunities.   11/03/22: Loud /a/ measurements average low-mid 90s dB, everyday sentences were measured at average low-mid 80s dB. Pt and SLP talked in 3 minute conversational segments in mod complex conversation with pt having upper 60s dB.  10/29/22: Loud /a/ measurements average mid 90s dB, everyday sentences were measured at average mid-80s dB. In sentence tasks pt req'd rare min A for loudness decay. Pt and SLP talked in mod complex conversation with pt having upper 60s dB for 12 minutes.  10/27/22: Loud /a/ measurements average mid 90s dB, everyday sentences were at mid-80s dB. In sentence tasks pt req'd occasional min-mod A for loudness decay faded to rare min -mod A for loudness decay. SLP encouraged pt to focus on his effort with loud /a/. In 2-3 minutes conversation segments pt req'd occasional mod A for maintaining  loudness.   PATIENT EDUCATION: Education details: see above in "today's treatment" Person educated: Patient Education method: Explanation, Demonstration, and Verbal cues Education comprehension: verbalized understanding and return demonstrated  GOALS: Goals reviewed with patient? Yes  SHORT TERM GOALS: Target date: 11/13/22    pt will produce loud /a/ with at least low 90s dB average over three sessions Baseline: 10/27/22, 10/29/22 Goal status: Met  2.  Pt will produce 16/20 sentence responses with 70dB over two sessions Baseline: 10/29/22 Goal status: Met  3.  Pt will produce 5 minutes simple conversation with volume upper 60s dB average over three sessions Baseline:  Goal status: Not met  4.  pt will generate abdominal breathing 80% of the time when engaging in 5 minutes simple conversation in 2 sessions Baseline:  Goal status: partially met    LONG TERM GOALS: Target date: 12/18/22     Pt will score higher than 18/32 on CES in the final two ST sessions Baseline:  Goal status: Met  2.  Pt will report successful usage of strategies for recall of people's names in 2 sessions Baseline: 12/08/22 Goal status: Met  3.  Pt will maintain average of upper 60s dB over 10 minute simple-mod complex conversation with rare min A in three sessions  Baseline: 12/18/22, 12-24-22 Goal status: Met  4.  pt will use abdominal breathing 75% of the time in 10 minutes simple-mod complex conversation in three sessions  Baseline: 11/27/22, 12/08/22 Goal status: Met   ASSESSMENT:  CLINICAL IMPRESSION: Patient is a 72 y.o. male who was seen today for treatment of speech clarity who demonstrated mod dysarthria and reduced breath support due to Parkinson's disease. See tx note from today. Louder speech cont carrying over into conversation. Pt would cont to benefit from skilled ST targeting loudness in conversation, and strategies for recalling people's names. Discharge today. OBJECTIVE IMPAIRMENTS  Objective impairments include memory and dysarthria. These impairments are limiting patient from managing medications, household responsibilities, and effectively communicating at home and in community.Factors affecting potential to achieve goals and functional outcome are previous level of function and severity of impairments.. Patient will benefit from skilled SLP services to address above impairments and improve overall function.  REHAB POTENTIAL: Good  PLAN: Discharge today.  PLANNED INTERVENTIONS: Environmental controls, Cueing hierachy, Internal/external aids, Functional tasks, Multimodal communication approach, SLP instruction and feedback, and Compensatory strategies    Finnley Larusso, CCC-SLP 12/31/2022, 2:46 PM

## 2023-02-18 ENCOUNTER — Other Ambulatory Visit: Payer: Self-pay | Admitting: Neurology

## 2023-03-20 ENCOUNTER — Encounter: Payer: Self-pay | Admitting: Family Medicine

## 2023-03-20 ENCOUNTER — Ambulatory Visit (INDEPENDENT_AMBULATORY_CARE_PROVIDER_SITE_OTHER): Payer: Medicare Other | Admitting: Family Medicine

## 2023-03-20 ENCOUNTER — Other Ambulatory Visit: Payer: Self-pay | Admitting: Family Medicine

## 2023-03-20 VITALS — BP 121/79 | HR 93 | Temp 97.7°F | Ht 66.0 in | Wt 207.0 lb

## 2023-03-20 DIAGNOSIS — E7800 Pure hypercholesterolemia, unspecified: Secondary | ICD-10-CM

## 2023-03-20 DIAGNOSIS — J40 Bronchitis, not specified as acute or chronic: Secondary | ICD-10-CM | POA: Diagnosis not present

## 2023-03-20 MED ORDER — PREDNISONE 20 MG PO TABS: 40.0000 mg | ORAL_TABLET | Freq: Every day | ORAL | 0 refills | Status: AC

## 2023-03-20 MED ORDER — POLYMYXIN B-TRIMETHOPRIM 10000-0.1 UNIT/ML-% OP SOLN: 1.0000 [drp] | OPHTHALMIC | 0 refills | Status: AC

## 2023-03-20 NOTE — Patient Instructions (Signed)
Continue to push fluids, practice good hand hygiene, and cover your mouth if you cough. ° °If you start having fevers, shaking or shortness of breath, seek immediate care. ° °OK to take Tylenol 1000 mg (2 extra strength tabs) or 975 mg (3 regular strength tabs) every 6 hours as needed. ° °Send me a message in 2-3 days if no improvement.  ° °Let us know if you need anything. °

## 2023-03-20 NOTE — Progress Notes (Addendum)
Chief Complaint  Patient presents with   Cough   Eye Drainage    Right eye     [name] [name] here for URI complaints.  Duration: 3 weeks  Associated symptoms: sinus congestion, rhinorrhea, wheezing, and productive cough Denies: sinus pain, itchy watery eyes, ear pain, ear drainage, sore throat, shortness of breath, myalgia, and fevers Treatment to date: Sudafed, Nyquil Sick contacts: No  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/27/2014   Mild neurocognitive disorder due to Parkinson's disease 01/10/2022   Non-Hodgkin lymphoma 2007   diffuse- 6 cycles of chemo with R CHOP Rituxan - last dose 10/08   Nonspecific elevation of levels of transaminase or lactic acid dehydrogenase (LDH) 07/06/2008   Parkinson's disease 07/27/2014   Post-splenectomy 04/02/2015    Objective BP 121/79 (BP Location: Left Arm, Patient Position: Sitting, Cuff Size: Normal)   Pulse 93   Temp 97.7 F (36.5 C) (Oral)   Ht 5\' 6"  (1.676 m)   Wt 207 lb (93.9 kg)   SpO2 94%   BMI 33.41 kg/m  General: Awake, alert, appears stated age HEENT: sclera white, yellow-green dc noted at lateral edge of R eye, conjunctiva on R injected; AT, [place], ears patent b/l and TM's neg, nares patent w/o discharge, pharynx pink and without exudates, MMM Neck: No masses or asymmetry Heart: RRR Lungs: CTAB, no accessory muscle use Psych: Age appropriate judgment and insight, normal mood and affect  Wheezy bronchitis - Plan: predniSONE (DELTASONE) 20 MG tablet  Pure hypercholesterolemia - Plan: Comprehensive metabolic panel, Lipid  panel  5-day prednisone burst 40 mg daily.  If no improvement by next week, he will let us know.  Continue to push fluids, practice good hand hygiene, cover mouth when coughing. Polytrim drops for eye.  F/u prn. If starting to experience fevers, shaking, or shortness of breath, seek immediate care. Pt voiced understanding and agreement to the plan.  Jilda Roche [place], DO 03/20/23 2:26 PM

## 2023-03-30 ENCOUNTER — Other Ambulatory Visit: Payer: Self-pay | Admitting: Neurology

## 2023-03-30 DIAGNOSIS — G20A2 Parkinson's disease without dyskinesia, with fluctuations: Secondary | ICD-10-CM

## 2023-04-06 ENCOUNTER — Ambulatory Visit: Payer: Medicare Other | Admitting: Neurology

## 2023-04-08 NOTE — Progress Notes (Signed)
Assessment/Plan:   1.  Parkinsons Disease  -Increase carbidopa/levodopa 25/100, 1.5 tablet at 7 AM/11 AM/ and 1 tablet at 3 PM/7 PM  - continue carbidopa/levodopa 50/200 CR q hs  -Continue pramipexole, 0.75 mg 3 times per day  -use cane at all times  -needs to exercise   2.  Meige syndrome  -Stable  -no tx needed right now  3.  Insomnia/mild RBD  -Continue clonazepam, 0.5 mg, 1/2 tablet at night.  Not taking faithfully.   Higher dosages didn't help and had hang over effect  -Continue remeron, 15 mg q hs.     4.  Memory change  -Patient saw Dr. Milbert Coulter January 10, 2022 with evidence of MCI.  5.  Post viral cough  -asked for tesslon perles and I think its reasonable but out of my field.  I will contact pcp and asked them to f/u  Subjective:   [name] [name] was seen today in follow up for Parkinsons disease.  My previous records were reviewed prior to todays visit as well as outside records available to me. Wife and daughter with patient and supplements history.  We added mirtazapine last visit.  He reports today that it helped.  He has been to physical and speech therapy since our last visit.  Notes are reviewed.  He has not had one fall - he turned and fell.  He started using a cane over the last week and its been really helpful.  He's not been exercising.  No hallucinations.  Wife states that he is shuffling more.  Pt had viral syndrome last month and still has the cough.  Ask me about RX for tesslon perles.  He saw PCP last month for it but didn't follow up.  Notes reviewed.  Current prescribed movement disorder medications: Pramipexole, 0.75 mg 3 times daily Carbidopa/levodopa 25/100, 1 tablet at 7 AM/11 AM/3 PM/7 PM  Carbidopa/levodopa 50/200 CR at bedtime  clonazepam, 0.5 mg, 1/2 tablet at bedtime  Mirtazapine, 15 mg at bedtime (added last visit)  ALLERGIES:  No Known Allergies  CURRENT MEDICATIONS:  Outpatient Encounter Medications as of 04/10/2023  Medication Sig    trimethoprim-polymyxin b (POLYTRIM) ophthalmic solution Place 1 drop into the right eye every 4 (four) hours.   aspirin 81 MG chewable tablet Chew 81 mg by mouth daily.   carbidopa-levodopa (SINEMET CR) 50-200 MG tablet TAKE 1 TABLET BY MOUTH EVERYDAY AT BEDTIME   carbidopa-levodopa (SINEMET CR) 50-200 MG tablet Take 1 tablet by mouth at bedtime.   carbidopa-levodopa (SINEMET IR) 25-100 MG tablet Take 1 tablet by mouth 4 (four) times daily. 7am/11am/3pm/7pm   Cholecalciferol (VITAMIN D3) 3000 UNITS TABS Take 1,000 Units by mouth daily.    clonazePAM (KLONOPIN) 0.5 MG tablet TAKE 0.5 TABLETS (0.25 MG TOTAL) BY MOUTH AT BEDTIME.   COVID-19 mRNA bivalent vaccine, Pfizer, (PFIZER COVID-19 VAC BIVALENT) injection Inject into the muscle.   cyanocobalamin 100 MCG tablet Take 1,000 mcg by mouth daily.    famotidine (PEPCID) 10 MG tablet Take 10 mg by mouth 2 (two) times daily.   Melatonin 3 MG TABS Take 3 mg by mouth at bedtime.    mirtazapine (REMERON) 15 MG tablet TAKE 1 TABLET BY MOUTH EVERYDAY AT BEDTIME   pramipexole (MIRAPEX) 0.75 MG tablet Take 1 tablet (0.75 mg total) by mouth 3 (three) times daily.   No facility-administered encounter medications on file as of 04/10/2023.    Objective:   PHYSICAL EXAMINATION:    VITALS:   Vitals:  04/10/23 0811  BP: 110/72  Pulse: 81  SpO2: 94%  Weight: 210 lb (95.3 kg)  Height: 5\' 6"  (1.676 m)    GEN:  The patient appears stated age and is in NAD. HEENT:  Normocephalic, atraumatic.  The mucous membranes are moist.  CV:  RRR Lungs:  CTAB Neck:  no bruits  Neurological examination:  Orientation: The patient is alert and oriented x3. Cranial nerves: There is good facial symmetry with facial hypomimia. The speech is fluent and pseudobulbar and a bit difficult to understand. Soft palate rises symmetrically and there is no tongue deviation. Hearing is intact to conversational tone. Sensation: Sensation is intact to light touch throughout Motor:  Strength is at least antigravity x4.  Movement examination: Tone: There is nl tone in the ue/le Abnormal movements: none Coordination:  There is mild decremation with toe taps bilaterally Gait and Station: The patient has no difficulty arising out of a deep-seated chair without the use of the hands. The patient's stride length is good today with the cane but is just a tad off balance  I have reviewed and interpreted the following labs independently    Chemistry      Component Value Date/Time   NA 140 08/14/2021 0906   K 4.3 08/14/2021 0906   CL 104 08/14/2021 0906   CO2 29 08/14/2021 0906   BUN 15 08/14/2021 0906   CREATININE 1.13 08/14/2021 0906   CREATININE 0.94 09/26/2014 0925      Component Value Date/Time   CALCIUM 9.1 08/14/2021 0906   ALKPHOS 53 08/14/2021 0906   AST 26 08/14/2021 0906   ALT 13 08/14/2021 0906   BILITOT 0.7 08/14/2021 0906       Lab Results  Component Value Date   WBC 9.1 04/12/2018   HGB 15.5 04/12/2018   HCT 45.4 04/12/2018   MCV 98.0 04/12/2018   PLT 307.0 04/12/2018    Lab Results  Component Value Date   TSH 1.67 03/27/2015     Total time spent on today's visit was 30  minutes, including both face-to-face time and nonface-to-face time.  Time included that spent on review of records (prior notes available to me/labs/imaging if pertinent), discussing treatment and goals, answering patient's questions and coordinating care.  Cc:  Sharlene Dory, DO

## 2023-04-10 ENCOUNTER — Ambulatory Visit (INDEPENDENT_AMBULATORY_CARE_PROVIDER_SITE_OTHER): Payer: Medicare Other | Admitting: Neurology

## 2023-04-10 ENCOUNTER — Other Ambulatory Visit: Payer: Self-pay | Admitting: Family Medicine

## 2023-04-10 VITALS — BP 110/72 | HR 81 | Ht 66.0 in | Wt 210.0 lb

## 2023-04-10 DIAGNOSIS — G20A2 Parkinson's disease without dyskinesia, with fluctuations: Secondary | ICD-10-CM | POA: Diagnosis not present

## 2023-04-10 DIAGNOSIS — R058 Other specified cough: Secondary | ICD-10-CM

## 2023-04-10 MED ORDER — BENZONATATE 200 MG PO CAPS: 200.0000 mg | ORAL_CAPSULE | Freq: Two times a day (BID) | ORAL | 0 refills | Status: DC | PRN

## 2023-04-10 MED ORDER — MIRTAZAPINE 15 MG PO TABS
15.0000 mg | ORAL_TABLET | Freq: Every day | ORAL | 2 refills | Status: DC
Start: 2023-04-10 — End: 2024-04-12

## 2023-04-10 MED ORDER — CARBIDOPA-LEVODOPA 25-100 MG PO TABS: ORAL_TABLET | ORAL | Status: DC

## 2023-04-10 NOTE — Patient Instructions (Addendum)
Take carbidopa/levodopa 25/100, 1.5 at 7am/11am and 1 tablet at 3pm/7pm  SAVE THE DATE!  We are planning a Parkinsons Disease educational symposium at [name] in [place] on October 11.  More details to come!  If you would like to be added to our email list to get further information, email [email or web address].  We hope to see you there!

## 2023-06-04 ENCOUNTER — Telehealth: Payer: Self-pay | Admitting: Physical Therapy

## 2023-06-04 DIAGNOSIS — G20A1 Parkinson's disease without dyskinesia, without mention of fluctuations: Secondary | ICD-10-CM

## 2023-06-04 NOTE — Telephone Encounter (Signed)
Dr. Arbutus Leas,  [place] is scheduled for  OT, PT, and ST re-evaluations in October 2024 as recommended when he was last discharged from therapy due to progressive nature of diagnosis.  Pt was in agreement with this plan.  If you are in agreement, please send updated order for OT, PT, and ST via epic.  Thank you,  Lonia Blood, PT 06/04/23 12:46 PM Phone: [phone] Fax: [phone]  [facility] at [facility] [address] [place], [address] [place], [place] [address] Phone # [phone] Fax # [phone]

## 2023-06-22 ENCOUNTER — Other Ambulatory Visit: Payer: Self-pay | Admitting: Neurology

## 2023-06-22 DIAGNOSIS — G20A2 Parkinson's disease without dyskinesia, with fluctuations: Secondary | ICD-10-CM

## 2023-06-23 ENCOUNTER — Ambulatory Visit: Payer: Medicare Other | Attending: Neurology | Admitting: Occupational Therapy

## 2023-06-23 ENCOUNTER — Ambulatory Visit: Payer: Medicare Other

## 2023-06-23 ENCOUNTER — Ambulatory Visit: Payer: Medicare Other | Admitting: Physical Therapy

## 2023-06-23 ENCOUNTER — Encounter: Payer: Self-pay | Admitting: Physical Therapy

## 2023-06-23 ENCOUNTER — Other Ambulatory Visit: Payer: Self-pay

## 2023-06-23 DIAGNOSIS — R2681 Unsteadiness on feet: Secondary | ICD-10-CM

## 2023-06-23 DIAGNOSIS — R41841 Cognitive communication deficit: Secondary | ICD-10-CM | POA: Diagnosis not present

## 2023-06-23 DIAGNOSIS — M6281 Muscle weakness (generalized): Secondary | ICD-10-CM | POA: Diagnosis not present

## 2023-06-23 DIAGNOSIS — R29818 Other symptoms and signs involving the nervous system: Secondary | ICD-10-CM | POA: Diagnosis not present

## 2023-06-23 DIAGNOSIS — R293 Abnormal posture: Secondary | ICD-10-CM | POA: Insufficient documentation

## 2023-06-23 DIAGNOSIS — R2689 Other abnormalities of gait and mobility: Secondary | ICD-10-CM | POA: Diagnosis not present

## 2023-06-23 DIAGNOSIS — R471 Dysarthria and anarthria: Secondary | ICD-10-CM | POA: Diagnosis not present

## 2023-06-23 DIAGNOSIS — G20A1 Parkinson's disease without dyskinesia, without mention of fluctuations: Secondary | ICD-10-CM | POA: Insufficient documentation

## 2023-06-23 DIAGNOSIS — R278 Other lack of coordination: Secondary | ICD-10-CM | POA: Diagnosis not present

## 2023-06-23 NOTE — Therapy (Signed)
OUTPATIENT OCCUPATIONAL THERAPY PARKINSON'S EVALUATION  Patient Name: [name] [name] MRN: [id number] DOB:Sep 17, 1951, 72 y.o., male Today's Date: 06/23/2023  PCP: Sharlene Dory, DO REFERRING PROVIDER: Vladimir Faster, DO  END OF SESSION:  OT End of Session - 06/23/23 1508     Visit Number 1    Number of Visits 1    Date for OT Re-Evaluation 06/23/23    Authorization Type Medicare A & B    OT Start Time 1318    OT Stop Time 1356    OT Time Calculation (min) 38 min             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/27/2014   Mild neurocognitive disorder due to Parkinson's disease 01/10/2022   Non-Hodgkin lymphoma 2007   diffuse- 6 cycles of chemo with R CHOP Rituxan - last dose 10/08   Nonspecific elevation of levels of transaminase or lactic acid dehydrogenase (LDH) 07/06/2008   Parkinson's disease 07/27/2014   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/02/2015   Past Surgical History:  Procedure Laterality Date   CHOLECYSTECTOMY, LAPAROSCOPIC     EYE SURGERY     x2 as child   PORT-A-CATH REMOVAL     PORTACATH PLACEMENT     SPLENECTOMY     TONSILLECTOMY     Patient Active Problem List   Diagnosis Date Noted   Mild neurocognitive disorder due to Parkinson's disease 01/10/2022   Gastroesophageal reflux disease 03/07/2020   Localized swelling of right lower extremity 04/12/2019   DOE (dyspnea on exertion) 07/15/2018   10 year risk of MI or stroke 7.5% or greater 04/12/2018   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/2015   Meige syndrome (blepharospasm with oromandibular dystonia) 07/27/2014    Dysphagia, neurologic 07/27/2014   Parkinson's disease (HCC) 07/27/2014   Dystonia 07/10/2014   Low back pain 11/21/2011   Nonspecific elevation of levels of transaminase or lactic acid dehydrogenase (LDH) 07/06/2008   Enlarged lymph nodes 07/28/2007   Non-Hodgkin lymphoma 03/23/2007    ONSET DATE: PD diagnosis ~2015  REFERRING DIAG: G20.A1 (ICD-10-CM) - Parkinson's disease without dyskinesia or fluctuating manifestations (HCC)  THERAPY DIAG:  Other symptoms and signs involving the nervous system  Other lack of coordination  Unsteadiness on feet  Abnormal posture  Rationale for Evaluation and Treatment: Rehabilitation  SUBJECTIVE:   SUBJECTIVE STATEMENT: Pt reports starting to use his mother-in-law's cane as he was having increased unsteadiness but no falls, therefore he purchased a [employer].  Pt reports feeling more confident and stable now with [facility], utilizing in home and in community.  Reports noticing increased difficulty maneuvering in tighter spots.  Pt reports feeling "faster" with self-care tasks.   Pt accompanied by: self  PERTINENT HISTORY: PD, Chronic pain R shoulder; PISA   PRECAUTIONS: Fall  WEIGHT BEARING RESTRICTIONS: No  PAIN:  Are you having pain? No  FALLS: Has patient fallen in last 6 months? No  LIVING ENVIRONMENT: Lives with: lives with their spouse Lives in: House/apartment Stairs:  Yes: External: w/c ramp in the garage, 2 steps at front entrance, 8 steps at back entrance with ability to reach rail on B sides steps; Internal:  3 flights of steps (man cave is on the 3rd floor) Has following equipment at home: Chiropractor, Shower bench, Grab bars, and Ramped entry  PLOF: Independent  PATIENT GOALS: no goal stated  OBJECTIVE:  Note: Objective measures were completed at Evaluation unless otherwise noted.  HAND DOMINANCE: Right  ADLs: Overall ADLs: "nothing has changed"  pt does not wear tie shoes Transfers/ambulation related to ADLs: Has  begun using [facility] with mobility Eating: Mod I Grooming: Mod I UB Dressing: reports some trouble pushing button through hole, but will lick fingers to provide increased grip LB Dressing: pt does not wear tie shoes; reports continuing to use strategies from LB dressing, noticing improved ease  Toileting: Mod I Bathing: Mod I, utilizing long handled sponge/loofah  Tub Shower transfers: Mod I in/out of walk-in shower  Equipment:  Shower seat with back, Grab bars, Walk in shower, Long handled shoe horn, and Long handled sponge  IADLs: Shopping: Mod I Light housekeeping: spouse performs these tasks Meal Prep: spouse performs these tasks Community mobility: Driving Medication management: spouse fills the pill box, pt utilizes alarms for recall.  May forget if not wearing watch Financial management: spouse takes care of this Handwriting: 90% legible and Mild micrographia  MOBILITY STATUS: Freezing  POSTURE COMMENTS:  rounded shoulders, forward head, and weight shift right  ACTIVITY TOLERANCE: Activity tolerance: WFL for tasks assessed on eval   FUNCTIONAL OUTCOME MEASURES: Fastening/unfastening 3 buttons: 32.0 sec on personal shirt Physical performance test: PPT#2 (simulated eating) 13.93 sec (dropped one) & PPT#4 (donning/doffing jacket): 7.04 sec  COORDINATION: 9 Hole Peg test: Right: 32.42 sec; Left: 38.56 (dropping 1) sec Box and Blocks:  Right 36 blocks (hitting barrier and blocks bouncing back into original side x~4), Left 42 blocks  Tremors: Resting  UE ROM:    R shoulder: 142*, L shoulder: 108*  UE MMT:   WFL  COGNITION: Overall cognitive status: Within functional limits for tasks assessed   TODAY'S TREATMENT:                                                                                                                              DATE:  06/23/23 Educated on use of large amplitude hand exercises prior to [facility] tasks and modifying hand placement and grading pressure on  grasp with self-feeding and handwriting tasks.  Reiterated recommendations for AE to aid in bathing/dressing.    PATIENT EDUCATION: Education details: Educated on role and purpose of OT as well as potential interventions and goals for therapy based on initial evaluation findings. Person educated: Patient Education method: Explanation Education comprehension: verbalized understanding  HOME EXERCISE PROGRAM: N/A  GOALS: Goals reviewed with patient? Yes  SHORT TERM GOALS: Target date: 06/23/23  Pt will verbalize understanding of adapted strategies to maximize safety and I with ADLs/ IADLs. Baseline: Goal status: MET  ASSESSMENT:  CLINICAL IMPRESSION: Patient is a 72 y.o. male who was seen today for occupational therapy evaluation for  for impairments due to PD impacting ADLs at [facility]. Pt arrives for evaluation, reporting no changes in function or coordination.  Pt demonstrates min difficulty with buttons, however minimal change from most recent evaluation and improvement from previous course of OT.  Pt demonstrating minimal to no changes with timed assessments, even some improvements noted with donning/doffing jacket and self-feeding.  Pt not interested in OT services at this time, reporting that he is utilizing strategies as educated on during most recent course of OT and is able to complete all ADLs/IADLs at appropriate level.    PERFORMANCE DEFICITS: in functional skills including ADLs, coordination, Fine motor control, Gross motor control, body mechanics, and UE functional use, cognitive skills including safety awareness, and psychosocial skills including routines and behaviors.   IMPAIRMENTS: are limiting patient from ADLs and IADLs.   COMORBIDITIES:  may have co-morbidities  that affects occupational performance. Patient will benefit from skilled OT to address above impairments and improve overall function.  MODIFICATION OR ASSISTANCE TO COMPLETE EVALUATION: No modification of tasks  or assist necessary to complete an evaluation.  OT OCCUPATIONAL PROFILE AND HISTORY: Problem focused assessment: Including review of records relating to presenting problem.  CLINICAL DECISION MAKING: LOW - limited treatment options, no task modification necessary  REHAB POTENTIAL: Excellent  EVALUATION COMPLEXITY: Low    PLAN:  OT FREQUENCY:  NA, eval only  OT DURATION: other: eval only  PLANNED INTERVENTIONS: self care/ADL training, therapeutic exercise, neuromuscular re-education, patient/family education, and DME and/or AE instructions  RECOMMENDED OTHER SERVICES: SLP and PT  CONSULTED AND AGREED WITH PLAN OF CARE: Patient  PLAN FOR NEXT SESSION: recommend PD screen in 6-8 months from conclusion of PT/SLP services   Rosalio Loud, OTR/L 06/23/2023, 3:09 PM   [facility] at [facility] [address], [address] [place], [place] [address] Phone # [phone] Fax # [phone]

## 2023-06-23 NOTE — Therapy (Signed)
[facility] [address] [place], [place], [address] Phone: [phone]   Fax:  [phone]  Patient Details  Name: MATTIS FEATHERLY MRN: [id number] Date of Birth: 1950-11-16 Referring Provider:  Vladimir Faster, DO  Encounter Date: 06/23/2023  ST ARRIVE-CANCEL  Pt entered ST eval today c/o of significant fatigue and decr'd stamina after his OT and PT evals. SLP suggested that we reschedule ST eval after pt has 3-4 weeks of PT first, to build stamina. He agreed with this idea. Pt's ST eval was rescheduled for week of 07/20/23.   Asherah Lavoy, CCC-SLP 06/23/2023, 3:01 PM  [facility] [address], [address] [place], [place], [address] Phone: [phone]   Fax:  [phone]

## 2023-06-23 NOTE — Therapy (Signed)
OUTPATIENT PHYSICAL THERAPY NEURO EVALUATION   Patient Name: [name] [name] MRN: [id number] DOB:02/16/1951, 72 y.o., male Today's Date: 06/23/2023   PCP: Sharlene Dory, DO REFERRING PROVIDER: Vladimir Faster, DO  END OF SESSION:  PT End of Session - 06/23/23 1404     Visit Number 1    Number of Visits 13    Date for PT Re-Evaluation 08/07/23    Authorization Type Medicare/BCBS    Progress Note Due on Visit 10    PT Start Time 1401    PT Stop Time 1443    PT Time Calculation (min) 42 min    Activity Tolerance Patient tolerated treatment well    Behavior During Therapy [facility] for tasks assessed/performed             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/27/2014   Mild neurocognitive disorder due to Parkinson's disease 01/10/2022   Non-Hodgkin lymphoma 2007   diffuse- 6 cycles of chemo with R CHOP Rituxan - last dose 10/08   Nonspecific elevation of levels of transaminase or lactic acid dehydrogenase (LDH) 07/06/2008   Parkinson's disease 07/27/2014   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/2015   Past Surgical History:  Procedure Laterality Date   CHOLECYSTECTOMY, LAPAROSCOPIC     EYE SURGERY     x2 as child   PORT-A-CATH REMOVAL     PORTACATH PLACEMENT     SPLENECTOMY     TONSILLECTOMY     Patient Active Problem List   Diagnosis Date Noted   Mild neurocognitive disorder due to Parkinson's disease 01/10/2022   Gastroesophageal reflux disease 03/07/2020   Localized swelling of right lower extremity 04/12/2019   DOE (dyspnea on exertion) 07/15/2018   10 year risk of MI or  stroke 7.5% or greater 04/12/2018   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/2015   Meige syndrome (blepharospasm with oromandibular dystonia) 07/27/2014   Dysphagia, neurologic 07/27/2014   Parkinson's disease (HCC) 07/27/2014   Dystonia 07/10/2014   Low back pain 11/21/2011   Nonspecific elevation of levels of transaminase or lactic acid dehydrogenase (LDH) 07/06/2008   Enlarged lymph nodes 07/28/2007   Non-Hodgkin lymphoma 03/23/2007    ONSET DATE: 06/04/2023 (MD referral)  REFERRING DIAG: G20.A1 (ICD-10-CM) - Parkinson's disease without dyskinesia or fluctuating manifestations (HCC)   THERAPY DIAG:  Unsteadiness on feet  Other abnormalities of gait and mobility  Muscle weakness (generalized)  Abnormal posture  Rationale for Evaluation and Treatment: Rehabilitation  SUBJECTIVE:  SUBJECTIVE STATEMENT: Had a bout of losing my voice and being sick; wife was sick and in the hospital.  Have begun using a cane [facility]) since last visit with Dr. Arbutus Leas.  Have been using the cane everyday and had no falls.  Better with the freezing episodes with the cane.  Still have trouble in the narrow space of the closet and hallway with wife's wheelchair.  Just feel overall slower.  Sometimes have difficulty with getting up from chairs, maybe like my legs are weaker. Pt accompanied by: self  PERTINENT HISTORY: Hx of Meige syndrome, insomnia, RBD, PD  PAIN:  Are you having pain? No  PRECAUTIONS: Fall  RED FLAGS: None   WEIGHT BEARING RESTRICTIONS: No  FALLS: Has patient fallen in last 6 months? No  LIVING ENVIRONMENT: Lives with: lives with their family Lives in: House/apartment Stairs: 3 floors, stair lift for w/c for wife; keeps cane on third floor Has following equipment at home: Single point cane and Quad cane large  base  PLOF: Independent with household mobility with device  PATIENT GOALS: Goals for therapy are to get back to feeling like I was before I got sick (June/July); just want to move better.   OBJECTIVE:  Note: Objective measures below were completed at Evaluation unless otherwise noted.  DIAGNOSTIC FINDINGS: NA  COGNITION: Overall cognitive status: Within functional limits for tasks assessed    POSTURE: rounded shoulders, forward head, and posterior pelvic tilt  LOWER EXTREMITY ROM:   AROM WFL in sitting  LOWER EXTREMITY MMT:    MMT Right Eval Left Eval  Hip flexion 4 4  Hip extension    Hip abduction 4 4  Hip adduction 4+ 4+  Hip internal rotation    Hip external rotation    Knee flexion 3+ 3+  Knee extension 4 4  Ankle dorsiflexion 3+ 3+  Ankle plantarflexion    Ankle inversion    Ankle eversion    (Blank rows = not tested)   TRANSFERS: Assistive device utilized: None  Sit to stand: Modified independence Stand to sit: Modified independence  GAIT: Gait pattern: step through pattern, decreased stride length, festinating, trunk flexed, poor foot clearance- Right, and poor foot clearance- Left Distance walked: 60 ft Assistive device utilized: Quad cane large base Level of assistance: Modified independence Comments: Carries cane some during eval  FUNCTIONAL TESTS:  5 times sit to stand: 10.53 sec Timed up and go (TUG): 15.28 sec with decreased eccentric control to sit Dynamic Gait Index: 14/24 TUG cognitive:  15.81 sec  with decreased eccentric control to sit 360 turns to L:  10 steps, 4.44 sec 360 turn to R:  14 steps, 7.12 sec 48M:  10.94 sec = 3 ft/sec   TODAY'S TREATMENT:                                                                                                                              DATE: 06/23/2023    PATIENT  EDUCATION: Education details: Eval results, POC Person educated: Patient Education method: Explanation Education comprehension:  verbalized understanding  HOME EXERCISE PROGRAM: Initiated sit<>stand with hands on knees with cues for eccentric control to sit, 3 x 5 reps, once/day  GOALS: Goals reviewed with patient? Yes  SHORT TERM GOALS: Target date: 07/24/2023  Pt will be independent with HEP for improved strength, balance, gait. Baseline: Goal status: INITIAL  2.  Pt will improve TUG score to less than or equal to 13 sec for decreased fall risk. Baseline: 15.28 sec Goal status: INITIAL  3.  Pt will improve DGI score to at least 17/24 to decrease fall risk. Baseline: 14/24 Goal status: INITIAL  LONG TERM GOALS: Target date: 08/07/2023  Pt will be independent with HEP for improved strength, balance, gait. Baseline:  Goal status: INITIAL  2.  Pt will improve TUG cognitive score to less than or equal to 13 sec for decreased fall risk. Baseline: 15.81 sec Goal status: INITIAL  3.  Pt will improve DGI score to at least 20/24 to decrease fall risk. Baseline: 14/24 Goal status: INITIAL  4.  Pt will verbalize understanding of ongoing community fitness upon d/c from PT.   Baseline:  Goal status: INITIAL  5.  to be assessed and goal to be written as appropriate.  Baseline:  Goal status:  INITIAL  ASSESSMENT:  CLINICAL IMPRESSION: Patient is a 72 y.o. male who was seen today for physical therapy evaluation and treatment for Parkinson's disease. He is known to OPPT from previous bouts of therapy.  He was sick with respiratory/bronchial infection in the early summer; pt reports this in addition to his wife's health issues, have led to functional decline and weakness for him.  He does report using cane consistently since this illness and recent neurologist visit.  He presents today with decreased strength, decreased functional strength, decreased balance, slightly decreased gait velocity since last bout of therapy.  He does overall have slowed FTSTS, TUG, gait velocity measures since discharge earlier  this year.  He is at increased fall risk per TUG and DGI scores.  He will benefit from skilled PT to address the above stated deficits for decreased fall risk and improved overall functional mobility.  OBJECTIVE IMPAIRMENTS: Abnormal gait, decreased balance, decreased knowledge of use of DME, decreased mobility, difficulty walking, decreased strength, and postural dysfunction.   ACTIVITY LIMITATIONS: standing, transfers, and locomotion level  PARTICIPATION LIMITATIONS: community activity, church, and community fitness  PERSONAL FACTORS: 3+ comorbidities: PMH  are also affecting patient's functional outcome.   REHAB POTENTIAL: Good  CLINICAL DECISION MAKING: Evolving/moderate complexity  EVALUATION COMPLEXITY: Moderate  PLAN:  PT FREQUENCY: 2x/week  PT DURATION: 6 weeks plus eval  PLANNED INTERVENTIONS: Therapeutic exercises, Therapeutic activity, Neuromuscular re-education, Balance training, Gait training, Patient/Family education, Self Care, Stair training, and DME instructions  PLAN FOR NEXT SESSION: Initiate HEP, work more on sit<>stand, step ups, lower extremity strengthening.  Assess and write LTG   Cleven Jansma W., PT 06/23/2023, 3:11 PM  [facility] at [facility] [address] [place], [address] [place], [place] [address] Phone # [phone] Fax # [phone]

## 2023-06-29 ENCOUNTER — Ambulatory Visit: Payer: Medicare Other

## 2023-06-29 DIAGNOSIS — R278 Other lack of coordination: Secondary | ICD-10-CM | POA: Diagnosis not present

## 2023-06-29 DIAGNOSIS — M6281 Muscle weakness (generalized): Secondary | ICD-10-CM

## 2023-06-29 DIAGNOSIS — R29818 Other symptoms and signs involving the nervous system: Secondary | ICD-10-CM | POA: Diagnosis not present

## 2023-06-29 DIAGNOSIS — G20A1 Parkinson's disease without dyskinesia, without mention of fluctuations: Secondary | ICD-10-CM

## 2023-06-29 DIAGNOSIS — R2681 Unsteadiness on feet: Secondary | ICD-10-CM

## 2023-06-29 DIAGNOSIS — R2689 Other abnormalities of gait and mobility: Secondary | ICD-10-CM | POA: Diagnosis not present

## 2023-06-29 DIAGNOSIS — R293 Abnormal posture: Secondary | ICD-10-CM

## 2023-06-29 NOTE — Therapy (Signed)
OUTPATIENT PHYSICAL THERAPY NEURO TREATMENT   Patient Name: [name] [name] MRN: [id number] DOB:09-12-1951, 72 y.o., male Today's Date: 06/29/2023   PCP: Sharlene Dory, DO REFERRING PROVIDER: Vladimir Faster, DO  END OF SESSION:  PT End of Session - 06/29/23 1410     Visit Number 2    Number of Visits 13    Date for PT Re-Evaluation 08/07/23    Authorization Type Medicare/BCBS-Pt is KX    Progress Note Due on Visit 10    PT Start Time 1400    PT Stop Time 1445    PT Time Calculation (min) 45 min    Activity Tolerance Patient tolerated treatment well    Behavior During Therapy [facility] for tasks assessed/performed             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   Mild neurocognitive disorder due to Parkinson's disease 01/10/2022   Non-Hodgkin lymphoma 2007   diffuse- 6 cycles of chemo with R CHOP Rituxan - last dose 10/08   Nonspecific elevation of levels of transaminase or lactic acid dehydrogenase (LDH) 07/06/2008   Parkinson's disease 07/27/2014   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/02/2015   Past Surgical History:  Procedure Laterality Date   CHOLECYSTECTOMY, LAPAROSCOPIC     EYE SURGERY     x2 as child   PORT-A-CATH REMOVAL     PORTACATH PLACEMENT     SPLENECTOMY     TONSILLECTOMY     Patient Active Problem List   Diagnosis Date Noted   Mild neurocognitive disorder due to Parkinson's disease 01/10/2022   Gastroesophageal reflux disease 03/07/2020   Localized swelling of right lower extremity 04/12/2019   DOE (dyspnea on exertion) 07/15/2018   10 year risk of MI  or stroke 7.5% or greater 04/12/2018   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/02/2015   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/27/2014   Dysphagia, neurologic 07/27/2014   Parkinson's disease (HCC) 07/27/2014   Dystonia 07/10/2014   Low back pain 11/21/2011   Nonspecific elevation of levels of transaminase or lactic acid dehydrogenase (LDH) 07/06/2008   Enlarged lymph nodes 07/28/2007   Non-Hodgkin lymphoma 03/23/2007    ONSET DATE: 06/04/2023 (MD referral)  REFERRING DIAG: G20.A1 (ICD-10-CM) - Parkinson's disease without dyskinesia or fluctuating manifestations (HCC)   THERAPY DIAG:  Parkinson's disease without dyskinesia or fluctuating manifestations (HCC)  Unsteadiness on feet  Other abnormalities of gait and mobility  Muscle weakness (generalized)  Abnormal posture  Rationale for Evaluation and Treatment: Rehabilitation  SUBJECTIVE:  SUBJECTIVE STATEMENT: Doing ok, the cane helps with the freezing episodes Pt accompanied by: self  PERTINENT HISTORY: Hx of Meige syndrome, insomnia, RBD, PD  PAIN:  Are you having pain? No  PRECAUTIONS: Fall  RED FLAGS: None   WEIGHT BEARING RESTRICTIONS: No  FALLS: Has patient fallen in last 6 months? No  LIVING ENVIRONMENT: Lives with: lives with their family Lives in: House/apartment Stairs: 3 floors, stair lift for w/c for wife; keeps cane on third floor Has following equipment at home: Single point cane and Quad cane large base  PLOF: Independent with household mobility with device  PATIENT GOALS: Goals for therapy are to get back to feeling like I was before I got sick (June/July); just want to move better.   OBJECTIVE:   TODAY'S TREATMENT: 06/29/23 Activity Comments  Standing PWR moves 1x10 Excellent recall and form  Freezing  of gait -step initiation activities w/ cane -clock face imagery for quarter turns -retrowalking and performing 180 deg turns in quarters down narrow path -stepping over hurdles forwards and sideways -sliding obstacles away with feet for SLS -fast gait with sudden change of direction unannounced for stability challenge -walking and carrying tray with object to balance                 Note: Objective measures below were completed at Evaluation unless otherwise noted.  DIAGNOSTIC FINDINGS: NA  COGNITION: Overall cognitive status: Within functional limits for tasks assessed    POSTURE: rounded shoulders, forward head, and posterior pelvic tilt  LOWER EXTREMITY ROM:   AROM WFL in sitting  LOWER EXTREMITY MMT:    MMT Right Eval Left Eval  Hip flexion 4 4  Hip extension    Hip abduction 4 4  Hip adduction 4+ 4+  Hip internal rotation    Hip external rotation    Knee flexion 3+ 3+  Knee extension 4 4  Ankle dorsiflexion 3+ 3+  Ankle plantarflexion    Ankle inversion    Ankle eversion    (Blank rows = not tested)   TRANSFERS: Assistive device utilized: None  Sit to stand: Modified independence Stand to sit: Modified independence  GAIT: Gait pattern: step through pattern, decreased stride length, festinating, trunk flexed, poor foot clearance- Right, and poor foot clearance- Left Distance walked: 60 ft Assistive device utilized: Quad cane large base Level of assistance: Modified independence Comments: Carries cane some during eval  FUNCTIONAL TESTS:  5 times sit to stand: 10.53 sec Timed up and go (TUG): 15.28 sec with decreased eccentric control to sit Dynamic Gait Index: 14/24 TUG cognitive:  15.81 sec  with decreased eccentric control to sit 360 turns to L:  10 steps, 4.44 sec 360 turn to R:  14 steps, 7.12 sec 40M:  10.94 sec = 3 ft/sec      PATIENT EDUCATION: Education details: Eval results, POC Person educated: Patient Education method:  Explanation Education comprehension: verbalized understanding  HOME EXERCISE PROGRAM: Initiated sit<>stand with hands on knees with cues for eccentric control to sit, 3 x 5 reps, once/day  GOALS: Goals reviewed with patient? Yes  SHORT TERM GOALS: Target date: 07/24/2023  Pt will be independent with HEP for improved strength, balance, gait. Baseline: Goal status: INITIAL  2.  Pt will improve TUG score to less than or equal to 13 sec for decreased fall risk. Baseline: 15.28 sec Goal status: INITIAL  3.  Pt will improve DGI score to at least 17/24 to decrease fall risk. Baseline: 14/24 Goal status: INITIAL  LONG TERM GOALS: Target date: 08/07/2023  Pt will be independent with HEP for improved strength, balance, gait. Baseline:  Goal status: INITIAL  2.  Pt will improve TUG cognitive score to less than or equal to 13 sec for decreased fall risk. Baseline: 15.81 sec Goal status: INITIAL  3.  Pt will improve DGI score to at least 20/24 to decrease fall risk. Baseline: 14/24 Goal status: INITIAL  4.  Pt will verbalize understanding of ongoing community fitness upon d/c from PT.   Baseline:  Goal status: INITIAL  5.  to be assessed and goal to be written as appropriate.  Baseline:  Goal status:  INITIAL  ASSESSMENT:  CLINICAL IMPRESSION: Initiated with standing large amplitude movements for dynamic warm-up and for coordination with excellent recall from past sessions.  Gait training strategies to improve freezing of gait and improve safety with navigating narrow areas and walking in reverse to improve safety with ambulation in home.  Good performance with quarter turns using clock-face imagery for reference.  Instability with single limb support especially with change of direction demands.  Good return on mult-tasking with carrying item without deviation to gait or LOB. Continued sessions to progress activities to improve mobility and reduce risk for falls.   OBJECTIVE  IMPAIRMENTS: Abnormal gait, decreased balance, decreased knowledge of use of DME, decreased mobility, difficulty walking, decreased strength, and postural dysfunction.   ACTIVITY LIMITATIONS: standing, transfers, and locomotion level  PARTICIPATION LIMITATIONS: community activity, church, and community fitness  PERSONAL FACTORS: 3+ comorbidities: PMH  are also affecting patient's functional outcome.   REHAB POTENTIAL: Good  CLINICAL DECISION MAKING: Evolving/moderate complexity  EVALUATION COMPLEXITY: Moderate  PLAN:  PT FREQUENCY: 2x/week  PT DURATION: 6 weeks plus eval  PLANNED INTERVENTIONS: Therapeutic exercises, Therapeutic activity, Neuromuscular re-education, Balance training, Gait training, Patient/Family education, Self Care, Stair training, and DME instructions  PLAN FOR NEXT SESSION: Initiate HEP, work more on sit<>stand, step ups, lower extremity strengthening.  Assess and write LTG   Dion Body, PT 06/29/2023, 2:10 PM  [facility] at [facility] [address] [place], [address] [place], [place] [address] Phone # [phone] Fax # [phone]

## 2023-07-06 ENCOUNTER — Ambulatory Visit: Payer: Medicare Other

## 2023-07-06 DIAGNOSIS — M6281 Muscle weakness (generalized): Secondary | ICD-10-CM

## 2023-07-06 DIAGNOSIS — R2681 Unsteadiness on feet: Secondary | ICD-10-CM

## 2023-07-06 DIAGNOSIS — R29818 Other symptoms and signs involving the nervous system: Secondary | ICD-10-CM | POA: Diagnosis not present

## 2023-07-06 DIAGNOSIS — R2689 Other abnormalities of gait and mobility: Secondary | ICD-10-CM | POA: Diagnosis not present

## 2023-07-06 DIAGNOSIS — R278 Other lack of coordination: Secondary | ICD-10-CM | POA: Diagnosis not present

## 2023-07-06 DIAGNOSIS — R293 Abnormal posture: Secondary | ICD-10-CM | POA: Diagnosis not present

## 2023-07-06 DIAGNOSIS — G20A1 Parkinson's disease without dyskinesia, without mention of fluctuations: Secondary | ICD-10-CM

## 2023-07-06 NOTE — Therapy (Signed)
OUTPATIENT PHYSICAL THERAPY NEURO TREATMENT   Patient Name: [name] [name] MRN: [id number] DOB:June 06, 1951, 72 y.o., male Today's Date: 07/06/2023   PCP: Sharlene Dory, DO REFERRING PROVIDER: Vladimir Faster, DO  END OF SESSION:  PT End of Session - 07/06/23 1353     Visit Number 3    Number of Visits 13    Date for PT Re-Evaluation 08/07/23    Authorization Type Medicare/BCBS-Pt is KX    Progress Note Due on Visit 10    PT Start Time 1400    PT Stop Time 1445    PT Time Calculation (min) 45 min    Activity Tolerance Patient tolerated treatment well    Behavior During Therapy [facility] for tasks assessed/performed             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/27/2014   Mild neurocognitive disorder due to Parkinson's disease 01/10/2022   Non-Hodgkin lymphoma 2007   diffuse- 6 cycles of chemo with R CHOP Rituxan - last dose 10/08   Nonspecific elevation of levels of transaminase or lactic acid dehydrogenase (LDH) 07/06/2008   Parkinson's disease 07/27/2014   Post-splenectomy 04/02/2015   Past Surgical History:  Procedure Laterality Date   CHOLECYSTECTOMY, LAPAROSCOPIC     EYE SURGERY     x2 as child   PORT-A-CATH REMOVAL     PORTACATH PLACEMENT     SPLENECTOMY     TONSILLECTOMY     Patient Active Problem List   Diagnosis Date Noted   Mild neurocognitive disorder due to Parkinson's disease 01/10/2022   Gastroesophageal reflux disease 03/07/2020   Localized swelling of right lower extremity 04/12/2019   DOE (dyspnea on exertion) 07/15/2018   10 year risk of MI  or stroke 7.5% or greater 04/12/2018   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/02/2015   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/27/2014   Dysphagia, neurologic 07/27/2014   Parkinson's disease (HCC) 07/27/2014   Dystonia 07/10/2014   Low back pain 11/21/2011   Nonspecific elevation of levels of transaminase or lactic acid dehydrogenase (LDH) 07/06/2008   Enlarged lymph nodes 07/28/2007   Non-Hodgkin lymphoma 03/23/2007    ONSET DATE: 06/04/2023 (MD referral)  REFERRING DIAG: G20.A1 (ICD-10-CM) - Parkinson's disease without dyskinesia or fluctuating manifestations (HCC)   THERAPY DIAG:  Parkinson's disease without dyskinesia or fluctuating manifestations (HCC)  Unsteadiness on feet  Other abnormalities of gait and mobility  Muscle weakness (generalized)  Abnormal posture  Other symptoms and signs involving the nervous system  Rationale for Evaluation and Treatment: Rehabilitation  SUBJECTIVE:  SUBJECTIVE STATEMENT: Doing ok, no new issues. Had a good weekend Pt accompanied by: self  PERTINENT HISTORY: Hx of Meige syndrome, insomnia, RBD, PD  PAIN:  Are you having pain? No  PRECAUTIONS: Fall  RED FLAGS: None   WEIGHT BEARING RESTRICTIONS: No  FALLS: Has patient fallen in last 6 months? No  LIVING ENVIRONMENT: Lives with: lives with their family Lives in: House/apartment Stairs: 3 floors, stair lift for w/c for wife; keeps cane on third floor Has following equipment at home: Single point cane and Quad cane large base  PLOF: Independent with household mobility with device  PATIENT GOALS: Goals for therapy are to get back to feeling like I was before I got sick (June/July); just want to move better.   OBJECTIVE:   TODAY'S TREATMENT: 07/06/23 Activity Comments  :  distance 950 ft,  Vitals pre-test: 143/84 mmHg, 61 bpm Vitals post-test: 151/82 mmHg, 73 bpm --compared to average of PD patients 1280 ft  Gait training -treadmill: forced pace, progressive increase in speed 30 sec speed intervals up to 2.9 mph and 0.1 increase in slow speed x 6 min. Rates 8/10 RPE, HR 85 bpm -shuttle walk: no freezing of gait with task-orientation 8x35 ft requiring repeated 180 turns  -retrowalk: 4x35 ft CGA, walk with ball toss  Gross motor coordination Ball throw/toss 3.3# ball various throws and incorporating footwork for increased complexity              TODAY'S TREATMENT: 06/29/23 Activity Comments  Standing PWR moves 1x10 Excellent recall and form  Freezing of gait -step initiation activities w/ cane -clock face imagery for quarter turns -retrowalking and performing 180 deg turns in quarters down narrow path -stepping over hurdles forwards and sideways -sliding obstacles away with feet for SLS -fast gait with sudden change of direction unannounced for stability challenge -walking and carrying tray with object to balance                 Note: Objective measures below were completed at Evaluation unless otherwise noted.  DIAGNOSTIC FINDINGS: NA  COGNITION: Overall cognitive status: Within functional limits for tasks assessed    POSTURE: rounded shoulders, forward head, and posterior pelvic tilt  LOWER EXTREMITY ROM:   AROM WFL in sitting  LOWER EXTREMITY MMT:    MMT Right Eval Left Eval  Hip flexion 4 4  Hip extension    Hip abduction 4 4  Hip adduction 4+ 4+  Hip internal rotation    Hip external rotation    Knee flexion 3+ 3+  Knee extension 4 4  Ankle dorsiflexion 3+ 3+  Ankle plantarflexion    Ankle inversion    Ankle eversion    (Blank rows = not tested)   TRANSFERS: Assistive device utilized: None  Sit to stand: Modified independence Stand to sit: Modified independence  GAIT: Gait pattern: step through pattern, decreased  stride length, festinating, trunk flexed, poor foot clearance- Right, and poor foot clearance- Left Distance walked: 60 ft Assistive device utilized: Quad cane large base Level of assistance: Modified independence Comments: Carries cane some during eval  FUNCTIONAL TESTS:  5 times sit to stand: 10.53 sec Timed up and go (TUG): 15.28 sec with decreased eccentric control to sit Dynamic Gait Index: 14/24 TUG cognitive:  15.81 sec  with decreased eccentric control to sit 360 turns to L:  10 steps, 4.44 sec 360 turn to R:  14 steps, 7.12 sec 28M:  10.94 sec = 3 ft/sec      PATIENT  EDUCATION: Education details: Eval results, POC Person educated: Patient Education method: Explanation Education comprehension: verbalized understanding  HOME EXERCISE PROGRAM: Initiated sit<>stand with hands on knees with cues for eccentric control to sit, 3 x 5 reps, once/day  GOALS: Goals reviewed with patient? Yes  SHORT TERM GOALS: Target date: 07/24/2023  Pt will be independent with HEP for improved strength, balance, gait. Baseline: Goal status: IN PROGRESS  2.  Pt will improve TUG score to less than or equal to 13 sec for decreased fall risk. Baseline: 15.28 sec Goal status: IN PROGRESS  3.  Pt will improve DGI score to at least 17/24 to decrease fall risk. Baseline: 14/24 Goal status: IN PROGRESS  LONG TERM GOALS: Target date: 08/07/2023  Pt will be independent with HEP for improved strength, balance, gait. Baseline:  Goal status: INITIAL  2.  Pt will improve TUG cognitive score to less than or equal to 13 sec for decreased fall risk. Baseline: 15.81 sec Goal status: INITIAL  3.  Pt will improve DGI score to at least 20/24 to decrease fall risk. Baseline: 14/24 Goal status: INITIAL  4.  Pt will verbalize understanding of ongoing community fitness upon d/c from PT.   Baseline:  Goal status: INITIAL  5.  to 1,280 ft to meet disease-matched norms  Baseline: 950 ft w/  NBQC  Goal status:  INITIAL  ASSESSMENT:  CLINICAL IMPRESSION: Initiated with demonstrating decreased gait speed as evidenced by 950 ft distance during test limits as compared to disease matched peers of 1,280 ft.  Continued with gait training activities to improve speed and stability during repeated turns.  Reduced FoG with task-oriented gait activities such as shuttle walk and when performing multi-tasking.  Good performance and tolerance to gross motor throwing activities with excellent carryover of relevant footwork to accompany for complex, compound movements. Continued sessions to progress POC details to improve mobility and reduce risk or falls  OBJECTIVE IMPAIRMENTS: Abnormal gait, decreased balance, decreased knowledge of use of DME, decreased mobility, difficulty walking, decreased strength, and postural dysfunction.   ACTIVITY LIMITATIONS: standing, transfers, and locomotion level  PARTICIPATION LIMITATIONS: community activity, church, and community fitness  PERSONAL FACTORS: 3+ comorbidities: PMH  are also affecting patient's functional outcome.   REHAB POTENTIAL: Good  CLINICAL DECISION MAKING: Evolving/moderate complexity  EVALUATION COMPLEXITY: Moderate  PLAN:  PT FREQUENCY: 2x/week  PT DURATION: 6 weeks plus eval  PLANNED INTERVENTIONS: Therapeutic exercises, Therapeutic activity, Neuromuscular re-education, Balance training, Gait training, Patient/Family education, Self Care, Stair training, and DME instructions  PLAN FOR NEXT SESSION: Initiate HEP, work more on sit<>stand, step ups, lower extremity strengthening.  FoG activities   Dion Body, PT 07/06/2023, 1:54 PM  [facility] at [facility] [address] [place], [address] [place], [place] [address] Phone # [phone] Fax # [phone]

## 2023-07-08 ENCOUNTER — Ambulatory Visit: Payer: Medicare Other | Admitting: Physical Therapy

## 2023-07-14 ENCOUNTER — Encounter: Payer: Self-pay | Admitting: Physical Therapy

## 2023-07-14 ENCOUNTER — Ambulatory Visit: Payer: Medicare Other | Admitting: Physical Therapy

## 2023-07-14 DIAGNOSIS — R2689 Other abnormalities of gait and mobility: Secondary | ICD-10-CM

## 2023-07-14 DIAGNOSIS — M6281 Muscle weakness (generalized): Secondary | ICD-10-CM

## 2023-07-14 DIAGNOSIS — R278 Other lack of coordination: Secondary | ICD-10-CM | POA: Diagnosis not present

## 2023-07-14 DIAGNOSIS — R2681 Unsteadiness on feet: Secondary | ICD-10-CM

## 2023-07-14 DIAGNOSIS — R29818 Other symptoms and signs involving the nervous system: Secondary | ICD-10-CM | POA: Diagnosis not present

## 2023-07-14 DIAGNOSIS — R293 Abnormal posture: Secondary | ICD-10-CM | POA: Diagnosis not present

## 2023-07-14 NOTE — Therapy (Signed)
OUTPATIENT PHYSICAL THERAPY NEURO TREATMENT   Patient Name: [name] BORELLO MRN: [id number] DOB:01-24-1951, 72 y.o., male Today's Date: 07/14/2023   PCP: Sharlene Dory, DO REFERRING PROVIDER: Vladimir Faster, DO  END OF SESSION:  PT End of Session - 07/14/23 1403     Visit Number 4    Number of Visits 13    Date for PT Re-Evaluation 08/07/23    Authorization Type Medicare/BCBS-Pt is KX    Progress Note Due on Visit 10    PT Start Time 1403    PT Stop Time 1444    PT Time Calculation (min) 41 min    Activity Tolerance Patient tolerated treatment well    Behavior During Therapy [facility] for tasks assessed/performed             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/27/2014   Mild neurocognitive disorder due to Parkinson's disease 01/10/2022   Non-Hodgkin lymphoma 2007   diffuse- 6 cycles of chemo with R CHOP Rituxan - last dose 10/08   Nonspecific elevation of levels of transaminase or lactic acid dehydrogenase (LDH) 07/06/2008   Parkinson's disease 07/27/2014   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/02/2015   Past Surgical History:  Procedure Laterality Date   CHOLECYSTECTOMY, LAPAROSCOPIC     EYE SURGERY     x2 as child   PORT-A-CATH REMOVAL     PORTACATH PLACEMENT     SPLENECTOMY     TONSILLECTOMY     Patient Active Problem List   Diagnosis Date Noted   Mild neurocognitive disorder due to Parkinson's disease 01/10/2022   Gastroesophageal reflux disease 03/07/2020   Localized swelling of right lower extremity 04/12/2019   DOE (dyspnea on exertion) 07/15/2018   10 year risk of MI  or stroke 7.5% or greater 04/12/2018   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/2015   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/27/2014   Dysphagia, neurologic 07/27/2014   Parkinson's disease (HCC) 07/27/2014   Dystonia 07/10/2014   Low back pain 11/21/2011   Nonspecific elevation of levels of transaminase or lactic acid dehydrogenase (LDH) 07/06/2008   Enlarged lymph nodes 07/28/2007   Non-Hodgkin lymphoma 03/23/2007    ONSET DATE: 06/04/2023 (MD referral)  REFERRING DIAG: G20.A1 (ICD-10-CM) - Parkinson's disease without dyskinesia or fluctuating manifestations (HCC)   THERAPY DIAG:  Unsteadiness on feet  Other abnormalities of gait and mobility  Muscle weakness (generalized)  Rationale for Evaluation and Treatment: Rehabilitation  SUBJECTIVE:  SUBJECTIVE STATEMENT: Little back pain today.  Bothered me getting up from the chair last night. Pt accompanied by: self  PERTINENT HISTORY: Hx of Meige syndrome, insomnia, RBD, PD  PAIN:  Are you having pain? Yes: NPRS scale: 0-3/10 Pain location: low back Pain description: sharp Aggravating factors: getting up from chair Relieving factors: heat  PRECAUTIONS: Fall  RED FLAGS: None   WEIGHT BEARING RESTRICTIONS: No  FALLS: Has patient fallen in last 6 months? No  LIVING ENVIRONMENT: Lives with: lives with their family Lives in: House/apartment Stairs: 3 floors, stair lift for w/c for wife; keeps cane on third floor Has following equipment at home: Single point cane and Quad cane large base  PLOF: Independent with household mobility with device  PATIENT GOALS: Goals for therapy are to get back to feeling like I was before I got sick (June/July); just want to move better.   OBJECTIVE:       TODAY'S TREATMENT:  07/06/23 Activity Comments     Gait training -treadmill: forced pace, progressive increase in speed 30 sec speed intervals up to 3 mph and 0.1 increase in slow speed x 8 min.  2 min cool down at 1.5 mph. Rates 8-9/10 RPE, HR 04-1004 bpm -short distance forward/back walk: no freezing of gait with only gait; then added carry task/weighted ball toss with task-orientation 8x25 ft requiring repeated 180 turns some freezing with turns      Sit to stand, 5 reps, holding 5# weight, 3 sets Good eccentric control  Sidestep and weightshift x 10 reps Over hurdles  Repeated sidestep over 4 hurdles 4 reps each direction Several episodes of decreased step length, foot clearance  Forward step over 4 hurdles x 8 reps Several episodes of decreased foot clearance      Note: Objective measures below were completed at Evaluation unless otherwise noted.  DIAGNOSTIC FINDINGS: NA  COGNITION: Overall cognitive status: Within functional limits for tasks assessed    POSTURE: rounded shoulders, forward head, and posterior pelvic tilt  LOWER EXTREMITY ROM:   AROM WFL in sitting  LOWER EXTREMITY MMT:    MMT Right Eval Left Eval  Hip flexion 4 4  Hip extension    Hip abduction 4 4  Hip adduction 4+ 4+  Hip internal rotation    Hip external rotation    Knee flexion 3+ 3+  Knee extension 4 4  Ankle dorsiflexion 3+ 3+  Ankle plantarflexion    Ankle inversion    Ankle eversion    (Blank rows = not tested)   TRANSFERS: Assistive device utilized: None  Sit to stand: Modified independence Stand to sit: Modified independence  GAIT: Gait pattern: step through pattern, decreased stride length, festinating, trunk flexed, poor foot clearance- Right, and poor foot clearance- Left Distance walked: 60 ft Assistive device utilized: Quad cane large base Level of assistance: Modified independence Comments: Carries cane some during eval  FUNCTIONAL TESTS:  5 times sit to stand: 10.53 sec Timed up and go  (TUG): 15.28 sec with decreased eccentric control to sit Dynamic Gait Index: 14/24 TUG cognitive:  15.81 sec  with decreased eccentric control to sit 360 turns to L:  10 steps, 4.44 sec 360 turn to R:  14 steps, 7.12 sec 61M:  10.94 sec = 3 ft/sec      PATIENT EDUCATION: Education details: Eval results, POC Person educated: Patient Education method: Explanation Education comprehension: verbalized understanding  HOME EXERCISE PROGRAM: Initiated sit<>stand with hands on knees with cues for eccentric control to sit,  3 x 5 reps, once/day  GOALS: Goals reviewed with patient? Yes  SHORT TERM GOALS: Target date: 07/24/2023  Pt will be independent with HEP for improved strength, balance, gait. Baseline: Goal status: IN PROGRESS  2.  Pt will improve TUG score to less than or equal to 13 sec for decreased fall risk. Baseline: 15.28 sec Goal status: IN PROGRESS  3.  Pt will improve DGI score to at least 17/24 to decrease fall risk. Baseline: 14/24 Goal status: IN PROGRESS  LONG TERM GOALS: Target date: 08/07/2023  Pt will be independent with HEP for improved strength, balance, gait. Baseline:  Goal status: INITIAL  2.  Pt will improve TUG cognitive score to less than or equal to 13 sec for decreased fall risk. Baseline: 15.81 sec Goal status: INITIAL  3.  Pt will improve DGI score to at least 20/24 to decrease fall risk. Baseline: 14/24 Goal status: INITIAL  4.  Pt will verbalize understanding of ongoing community fitness upon d/c from PT.   Baseline:  Goal status: INITIAL  5.  to 1,280 ft to meet disease-matched norms  Baseline: 950 ft w/ NBQC  Goal status:  INITIAL  ASSESSMENT:  CLINICAL IMPRESSION: Gait activities in skilled PT session today, initiated with treadmill gait, with pt achieving his stated goal of 3 mph today.  He does rate effort level 8-9/10, with PT educating pt that 7-8/10 level is more appropriate for sustained aerobic activity.  He does a  good job with foot clearance on treadmill, which also carries over to on ground gait.  With addition of weight ball toss and turning 180 degrees, he does begin to demo festination/freezing, which he is able to get out of with weightshifting.  He notes he is feeling stronger overall.  He will continue to benefit from skilled PT towards goals for improved functional strength and gait.    OBJECTIVE IMPAIRMENTS: Abnormal gait, decreased balance, decreased knowledge of use of DME, decreased mobility, difficulty walking, decreased strength, and postural dysfunction.   ACTIVITY LIMITATIONS: standing, transfers, and locomotion level  PARTICIPATION LIMITATIONS: community activity, church, and community fitness  PERSONAL FACTORS: 3+ comorbidities: PMH  are also affecting patient's functional outcome.   REHAB POTENTIAL: Good  CLINICAL DECISION MAKING: Evolving/moderate complexity  EVALUATION COMPLEXITY: Moderate  PLAN:  PT FREQUENCY: 2x/week  PT DURATION: 6 weeks plus eval  PLANNED INTERVENTIONS: Therapeutic exercises, Therapeutic activity, Neuromuscular re-education, Balance training, Gait training, Patient/Family education, Self Care, Stair training, and DME instructions  PLAN FOR NEXT SESSION: Initiate HEP (pt states performing sit<>stand and sidestepping at home, but not sure he has new MedBridge code this bout of therapy yet), work more on sit<>stand, step ups, lower extremity strengthening.  FoG activities   Breylon Sherrow W., PT 07/14/2023, 2:55 PM  [facility] at [facility] [address] [place], [address] [place], [place] [address] Phone # [phone] Fax # [phone]

## 2023-07-16 ENCOUNTER — Ambulatory Visit: Payer: Medicare Other | Admitting: Physical Therapy

## 2023-07-16 NOTE — Therapy (Signed)
OUTPATIENT PHYSICAL THERAPY NEURO TREATMENT   Patient Name: [name] [name] MRN: [id number] DOB:[birth year], 72 y.o., male Today's Date: 07/17/2023   PCP: Sharlene Dory, DO REFERRING PROVIDER: Vladimir Faster, DO  END OF SESSION:  PT End of Session - 07/17/23 1150     Visit Number 5    Number of Visits 13    Date for PT Re-Evaluation 08/07/23    Authorization Type Medicare/BCBS-Pt is KX    Progress Note Due on Visit 10    PT Start Time 1106    PT Stop Time 1145    PT Time Calculation (min) 39 min    Equipment Utilized During Treatment Gait belt    Activity Tolerance Patient tolerated treatment well    Behavior During Therapy WFL for tasks assessed/performed              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/27/2014   Mild neurocognitive disorder due to Parkinson's disease 01/10/2022   Non-Hodgkin lymphoma 2007   diffuse- 6 cycles of chemo with R CHOP Rituxan - last dose 10/08   Nonspecific elevation of levels of transaminase or lactic acid dehydrogenase (LDH) 07/06/2008   Parkinson's disease 07/27/2014   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/2015   Past Surgical History:  Procedure Laterality Date   CHOLECYSTECTOMY, LAPAROSCOPIC     EYE SURGERY     x2 as child   PORT-A-CATH REMOVAL     PORTACATH PLACEMENT     SPLENECTOMY     TONSILLECTOMY     Patient Active Problem List   Diagnosis Date Noted   Mild neurocognitive disorder due to Parkinson's disease 01/10/2022   Gastroesophageal reflux disease 03/07/2020   Localized swelling of right lower extremity 04/12/2019   DOE  (dyspnea on exertion) 07/15/2018   10 year risk of MI or stroke 7.5% or greater 04/12/2018   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/2015   Meige syndrome (blepharospasm with oromandibular dystonia) 07/27/2014   Dysphagia, neurologic 07/27/2014   Parkinson's disease (HCC) 07/27/2014   Dystonia 07/10/2014   Low back pain 11/21/2011   Nonspecific elevation of levels of transaminase or lactic acid dehydrogenase (LDH) 07/06/2008   Enlarged lymph nodes 07/28/2007   Non-Hodgkin lymphoma 03/23/2007    ONSET DATE: 06/04/2023 (MD referral)  REFERRING DIAG: G20.A1 (ICD-10-CM) - Parkinson's disease without dyskinesia or fluctuating manifestations (HCC)   THERAPY DIAG:  Unsteadiness on feet  Other abnormalities of gait and mobility  Muscle weakness (generalized)  Parkinson's disease without dyskinesia or fluctuating manifestations (HCC)  Abnormal posture  Rationale for Evaluation and Treatment: Rehabilitation  SUBJECTIVE:  SUBJECTIVE STATEMENT: Nothing new. Back is feeling fine. Reports quad cane slows him down.   Pt accompanied by: self  PERTINENT HISTORY: Hx of Meige syndrome, insomnia, RBD, PD  PAIN:  Are you having pain? Yes: NPRS scale: 0/10 Pain location: low back Pain description: sharp Aggravating factors: getting up from chair Relieving factors: heat  PRECAUTIONS: Fall  RED FLAGS: None   WEIGHT BEARING RESTRICTIONS: No  FALLS: Has patient fallen in last 6 months? No  LIVING ENVIRONMENT: Lives with: lives with their family Lives in: House/apartment Stairs: 3 floors, stair lift for w/c for wife; keeps cane on third floor Has following equipment at home: Single point cane and Quad cane large base  PLOF: Independent with household mobility with device  PATIENT GOALS: Goals for therapy are to get  back to feeling like I was before I got sick (June/July); just want to move better.   OBJECTIVE:      TODAY'S TREATMENT: 07/17/23 Activity Comments  TM walking 1-66mph in intervals totaling 7 min  B UE support; good stride   Black medball STS 10x  Good posture correction   PWR rock with STS 2x10 Set switching ; good form and use of eye boost; 2nd set with more intentional pace   weighted step ups with black medball 2x10  Mild unsteadiness, CGA, cues to avoid dragging feet off the step , to set foot fully on the step   Gait training with SPC , quad tip cane, quad cane x17ft each Good pace, stride with SPC; quad cane gait was less consistent and cane made less contact with the ground. Pt reports preference for quad tip cane    PATIENT EDUCATION: Education details: edu on cane tip cane and where to purchase  Person educated: Patient Education method: Explanation, Demonstration, Tactile cues, Verbal cues, and Handouts Education comprehension: verbalized understanding and returned demonstration       Note: Objective measures below were completed at Evaluation unless otherwise noted.  DIAGNOSTIC FINDINGS: NA  COGNITION: Overall cognitive status: Within functional limits for tasks assessed    POSTURE: rounded shoulders, forward head, and posterior pelvic tilt  LOWER EXTREMITY ROM:   AROM WFL in sitting  LOWER EXTREMITY MMT:    MMT Right Eval Left Eval  Hip flexion 4 4  Hip extension    Hip abduction 4 4  Hip adduction 4+ 4+  Hip internal rotation    Hip external rotation    Knee flexion 3+ 3+  Knee extension 4 4  Ankle dorsiflexion 3+ 3+  Ankle plantarflexion    Ankle inversion    Ankle eversion    (Blank rows = not tested)   TRANSFERS: Assistive device utilized: None  Sit to stand: Modified independence Stand to sit: Modified independence  GAIT: Gait pattern: step through pattern, decreased stride length, festinating, trunk flexed, poor foot clearance-  Right, and poor foot clearance- Left Distance walked: 60 ft Assistive device utilized: Quad cane large base Level of assistance: Modified independence Comments: Carries cane some during eval  FUNCTIONAL TESTS:  5 times sit to stand: 10.53 sec Timed up and go (TUG): 15.28 sec with decreased eccentric control to sit Dynamic Gait Index: 14/24 TUG cognitive:  15.81 sec  with decreased eccentric control to sit 360 turns to L:  10 steps, 4.44 sec 360 turn to R:  14 steps, 7.12 sec 24M:  10.94 sec = 3 ft/sec      PATIENT EDUCATION: Education details: Eval results, POC Person educated: Patient Education method: Explanation  Education comprehension: verbalized understanding  HOME EXERCISE PROGRAM: Initiated sit<>stand with hands on knees with cues for eccentric control to sit, 3 x 5 reps, once/day  GOALS: Goals reviewed with patient? Yes  SHORT TERM GOALS: Target date: 07/24/2023  Pt will be independent with HEP for improved strength, balance, gait. Baseline: Goal status: IN PROGRESS  2.  Pt will improve TUG score to less than or equal to 13 sec for decreased fall risk. Baseline: 15.28 sec Goal status: IN PROGRESS  3.  Pt will improve DGI score to at least 17/24 to decrease fall risk. Baseline: 14/24 Goal status: IN PROGRESS  LONG TERM GOALS: Target date: 08/07/2023  Pt will be independent with HEP for improved strength, balance, gait. Baseline:  Goal status: INITIAL  2.  Pt will improve TUG cognitive score to less than or equal to 13 sec for decreased fall risk. Baseline: 15.81 sec Goal status: INITIAL  3.  Pt will improve DGI score to at least 20/24 to decrease fall risk. Baseline: 14/24 Goal status: INITIAL  4.  Pt will verbalize understanding of ongoing community fitness upon d/c from PT.   Baseline:  Goal status: INITIAL  5.  to 1,280 ft to meet disease-matched norms  Baseline: 950 ft w/ NBQC  Goal status:  INITIAL  ASSESSMENT:  CLINICAL  IMPRESSION: Patient arrived to session without complaints. Patient demonstrated good stride length with gait training on TM; more significant anterior trunk lean evident with quicker speeds. Demonstrated good form with transfers with addition of balance and weighted challenges. Step ups revealed some instability and cues required for safe foot placement. Gait training trialed several different canes as pt reports slowed speed of ambulation with his quad cane and demonstrated more consistent use of cane with quad tip support.   OBJECTIVE IMPAIRMENTS: Abnormal gait, decreased balance, decreased knowledge of use of DME, decreased mobility, difficulty walking, decreased strength, and postural dysfunction.   ACTIVITY LIMITATIONS: standing, transfers, and locomotion level  PARTICIPATION LIMITATIONS: community activity, church, and community fitness  PERSONAL FACTORS: 3+ comorbidities: PMH  are also affecting patient's functional outcome.   REHAB POTENTIAL: Good  CLINICAL DECISION MAKING: Evolving/moderate complexity  EVALUATION COMPLEXITY: Moderate  PLAN:  PT FREQUENCY: 2x/week  PT DURATION: 6 weeks plus eval  PLANNED INTERVENTIONS: Therapeutic exercises, Therapeutic activity, Neuromuscular re-education, Balance training, Gait training, Patient/Family education, Self Care, Stair training, and DME instructions  PLAN FOR NEXT SESSION: Initiate HEP (pt states performing sit<>stand and sidestepping at home, but not sure he has new MedBridge code this bout of therapy yet), work more on sit<>stand, step ups, lower extremity strengthening.  FoG activities    Anette Guarneri, [place], DPT 07/17/23 11:56 AM  [facility] at [facility] [address] [place], [address] [place], [place] [address] Phone # [phone] Fax # [phone]

## 2023-07-17 ENCOUNTER — Other Ambulatory Visit: Payer: Self-pay | Admitting: Neurology

## 2023-07-17 ENCOUNTER — Ambulatory Visit: Payer: Medicare Other | Admitting: Physical Therapy

## 2023-07-17 ENCOUNTER — Encounter: Payer: Self-pay | Admitting: Physical Therapy

## 2023-07-17 DIAGNOSIS — R2681 Unsteadiness on feet: Secondary | ICD-10-CM

## 2023-07-17 DIAGNOSIS — R29818 Other symptoms and signs involving the nervous system: Secondary | ICD-10-CM | POA: Diagnosis not present

## 2023-07-17 DIAGNOSIS — G20A2 Parkinson's disease without dyskinesia, with fluctuations: Secondary | ICD-10-CM

## 2023-07-17 DIAGNOSIS — G20A1 Parkinson's disease without dyskinesia, without mention of fluctuations: Secondary | ICD-10-CM

## 2023-07-17 DIAGNOSIS — R293 Abnormal posture: Secondary | ICD-10-CM

## 2023-07-17 DIAGNOSIS — M6281 Muscle weakness (generalized): Secondary | ICD-10-CM

## 2023-07-17 DIAGNOSIS — R278 Other lack of coordination: Secondary | ICD-10-CM | POA: Diagnosis not present

## 2023-07-17 DIAGNOSIS — R2689 Other abnormalities of gait and mobility: Secondary | ICD-10-CM

## 2023-07-17 NOTE — Therapy (Signed)
OUTPATIENT PHYSICAL THERAPY NEURO TREATMENT   Patient Name: [name] [name] MRN: [id number] DOB:1951/09/18, 72 y.o., male Today's Date: 07/20/2023   PCP: Sharlene Dory, DO REFERRING PROVIDER: Vladimir Faster, DO  END OF SESSION:  PT End of Session - 07/20/23 1445     Visit Number 6    Number of Visits 13    Date for PT Re-Evaluation 08/07/23    Authorization Type Medicare/BCBS-Pt is KX    Progress Note Due on Visit 10    PT Start Time 1413   pt late   PT Stop Time 1444    PT Time Calculation (min) 31 min    Equipment Utilized During Treatment Gait belt    Activity Tolerance Patient tolerated treatment well    Behavior During Therapy WFL for tasks assessed/performed              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/27/2014   Mild neurocognitive disorder due to Parkinson's disease 01/10/2022   Non-Hodgkin lymphoma 2007   diffuse- 6 cycles of chemo with R CHOP Rituxan - last dose 10/08   Nonspecific elevation of levels of transaminase or lactic acid dehydrogenase (LDH) 07/06/2008   Parkinson's disease 07/27/2014   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/02/2015   Past Surgical History:  Procedure Laterality Date   CHOLECYSTECTOMY, LAPAROSCOPIC     EYE SURGERY     x2 as child   PORT-A-CATH REMOVAL     PORTACATH PLACEMENT     SPLENECTOMY     TONSILLECTOMY     Patient Active Problem List   Diagnosis Date Noted   Mild neurocognitive disorder due to Parkinson's disease 01/10/2022   Gastroesophageal reflux disease 03/07/2020   Localized swelling of right lower extremity 04/12/2019    DOE (dyspnea on exertion) 07/15/2018   10 year risk of MI or stroke 7.5% or greater 04/12/2018   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/02/2015   Meige syndrome (blepharospasm with oromandibular dystonia) 07/27/2014   Dysphagia, neurologic 07/27/2014   Parkinson's disease (HCC) 07/27/2014   Dystonia 07/10/2014   Low back pain 11/21/2011   Nonspecific elevation of levels of transaminase or lactic acid dehydrogenase (LDH) 07/06/2008   Enlarged lymph nodes 07/28/2007   Non-Hodgkin lymphoma 03/23/2007    ONSET DATE: 06/04/2023 (MD referral)  REFERRING DIAG: G20.A1 (ICD-10-CM) - Parkinson's disease without dyskinesia or fluctuating manifestations (HCC)   THERAPY DIAG:  Unsteadiness on feet  Other abnormalities of gait and mobility  Muscle weakness (generalized)  Parkinson's disease without dyskinesia or fluctuating manifestations (HCC)  Abnormal posture  Rationale for Evaluation and Treatment: Rehabilitation  SUBJECTIVE:  SUBJECTIVE STATEMENT: New cane feels "great." Reports that today is a bad Parkinson's day.  Pt accompanied by: self  PERTINENT HISTORY: Hx of Meige syndrome, insomnia, RBD, PD  PAIN:  Are you having pain? Yes: NPRS scale: 2/10 Pain location: low back Pain description: sharp Aggravating factors: getting up from chair Relieving factors: heat  PRECAUTIONS: Fall  RED FLAGS: None   WEIGHT BEARING RESTRICTIONS: No  FALLS: Has patient fallen in last 6 months? No  LIVING ENVIRONMENT: Lives with: lives with their family Lives in: House/apartment Stairs: 3 floors, stair lift for w/c for wife; keeps cane on third floor Has following equipment at home: Single point cane and Quad cane large base  PLOF: Independent with household mobility with device  PATIENT GOALS: Goals for therapy are  to get back to feeling like I was before I got sick (June/July); just want to move better.   OBJECTIVE:      TODAY'S TREATMENT: 07/20/23 Activity Comments  TM walking 1-26mph in intervals totaling 6 min  For neural priming   Heels catching occasionally   lunge walk + twist  CGA, cues for longer steps, looking ahead, coordinating UEs   high step + reciprocal arm swing Difficulty  coordinating this; Trialed in II bars 1st for safety, then CGA with cues to reset   Black medball weighted step ups 6" 2x10  More effortful today; cues to place feet fully onto step for safety;   gait + head turns/nods  CGA-min A; c/o mild dizziness        Note: Objective measures below were completed at Evaluation unless otherwise noted.  DIAGNOSTIC FINDINGS: NA  COGNITION: Overall cognitive status: Within functional limits for tasks assessed    POSTURE: rounded shoulders, forward head, and posterior pelvic tilt  LOWER EXTREMITY ROM:   AROM WFL in sitting  LOWER EXTREMITY MMT:    MMT Right Eval Left Eval  Hip flexion 4 4  Hip extension    Hip abduction 4 4  Hip adduction 4+ 4+  Hip internal rotation    Hip external rotation    Knee flexion 3+ 3+  Knee extension 4 4  Ankle dorsiflexion 3+ 3+  Ankle plantarflexion    Ankle inversion    Ankle eversion    (Blank rows = not tested)   TRANSFERS: Assistive device utilized: None  Sit to stand: Modified independence Stand to sit: Modified independence  GAIT: Gait pattern: step through pattern, decreased stride length, festinating, trunk flexed, poor foot clearance- Right, and poor foot clearance- Left Distance walked: 60 ft Assistive device utilized: Quad cane large base Level of assistance: Modified independence Comments: Carries cane some during eval  FUNCTIONAL TESTS:  5 times sit to stand: 10.53 sec Timed up and go (TUG): 15.28 sec with decreased eccentric control to sit Dynamic Gait Index: 14/24 TUG cognitive:  15.81 sec  with  decreased eccentric control to sit 360 turns to L:  10 steps, 4.44 sec 360 turn to R:  14 steps, 7.12 sec 37M:  10.94 sec = 3 ft/sec      PATIENT EDUCATION: Education details: Eval results, POC Person educated: Patient Education method: Explanation Education comprehension: verbalized understanding  HOME EXERCISE PROGRAM: Initiated sit<>stand with hands on knees with cues for eccentric control to sit, 3 x 5 reps, once/day  GOALS: Goals reviewed with patient? Yes  SHORT TERM GOALS: Target date: 07/24/2023  Pt will be independent with HEP for improved strength, balance, gait. Baseline: Goal status: IN PROGRESS  2.  Pt  will improve TUG score to less than or equal to 13 sec for decreased fall risk. Baseline: 15.28 sec Goal status: IN PROGRESS  3.  Pt will improve DGI score to at least 17/24 to decrease fall risk. Baseline: 14/24 Goal status: IN PROGRESS  LONG TERM GOALS: Target date: 08/07/2023  Pt will be independent with HEP for improved strength, balance, gait. Baseline:  Goal status: INITIAL  2.  Pt will improve TUG cognitive score to less than or equal to 13 sec for decreased fall risk. Baseline: 15.81 sec Goal status: INITIAL  3.  Pt will improve DGI score to at least 20/24 to decrease fall risk. Baseline: 14/24 Goal status: INITIAL  4.  Pt will verbalize understanding of ongoing community fitness upon d/c from PT.   Baseline:  Goal status: INITIAL  5.  to 1,280 ft to meet disease-matched norms  Baseline: 950 ft w/ NBQC  Goal status:  INITIAL  ASSESSMENT:  CLINICAL IMPRESSION: Patient arrived to session with report of mild LBP and reports it being a "bad Parkinson's day" today. Warmed up with gait training on TM for neural priming as freezing episodes seemed to be slightly more apparent today. Worked on dynamic balance tasks, focusing on large amplitude movements and UE/LE coordination. Patient had a hard time coordinating these tasks and required  CGA-min A occasionally d/t imbalance. Also worked on step ups for improved balance and confidence on stairs- this appeared more effortful today. Patient tolerated session well.    OBJECTIVE IMPAIRMENTS: Abnormal gait, decreased balance, decreased knowledge of use of DME, decreased mobility, difficulty walking, decreased strength, and postural dysfunction.   ACTIVITY LIMITATIONS: standing, transfers, and locomotion level  PARTICIPATION LIMITATIONS: community activity, church, and community fitness  PERSONAL FACTORS: 3+ comorbidities: PMH  are also affecting patient's functional outcome.   REHAB POTENTIAL: Good  CLINICAL DECISION MAKING: Evolving/moderate complexity  EVALUATION COMPLEXITY: Moderate  PLAN:  PT FREQUENCY: 2x/week  PT DURATION: 6 weeks plus eval  PLANNED INTERVENTIONS: Therapeutic exercises, Therapeutic activity, Neuromuscular re-education, Balance training, Gait training, Patient/Family education, Self Care, Stair training, and DME instructions  PLAN FOR NEXT SESSION: Initiate HEP (pt states performing sit<>stand and sidestepping at home, but not sure he has new MedBridge code this bout of therapy yet), work more on sit<>stand, step ups, lower extremity strengthening.  FoG activities     Anette Guarneri, [place], DPT 07/20/23 2:47 PM  [facility] at [facility] [address] [place], [address] [place], [place] [address] Phone # [phone] Fax # [phone]

## 2023-07-20 ENCOUNTER — Ambulatory Visit: Payer: Medicare Other | Admitting: Physical Therapy

## 2023-07-20 ENCOUNTER — Ambulatory Visit: Payer: Medicare Other

## 2023-07-20 ENCOUNTER — Encounter: Payer: Self-pay | Admitting: Physical Therapy

## 2023-07-20 DIAGNOSIS — R29818 Other symptoms and signs involving the nervous system: Secondary | ICD-10-CM | POA: Diagnosis not present

## 2023-07-20 DIAGNOSIS — R2689 Other abnormalities of gait and mobility: Secondary | ICD-10-CM | POA: Diagnosis not present

## 2023-07-20 DIAGNOSIS — R278 Other lack of coordination: Secondary | ICD-10-CM | POA: Diagnosis not present

## 2023-07-20 DIAGNOSIS — M6281 Muscle weakness (generalized): Secondary | ICD-10-CM | POA: Diagnosis not present

## 2023-07-20 DIAGNOSIS — R293 Abnormal posture: Secondary | ICD-10-CM

## 2023-07-20 DIAGNOSIS — R471 Dysarthria and anarthria: Secondary | ICD-10-CM

## 2023-07-20 DIAGNOSIS — R2681 Unsteadiness on feet: Secondary | ICD-10-CM | POA: Diagnosis not present

## 2023-07-20 DIAGNOSIS — G20A1 Parkinson's disease without dyskinesia, without mention of fluctuations: Secondary | ICD-10-CM

## 2023-07-20 DIAGNOSIS — R41841 Cognitive communication deficit: Secondary | ICD-10-CM

## 2023-07-20 NOTE — Therapy (Signed)
OUTPATIENT SPEECH LANGUAGE PATHOLOGY PARKINSON'S EVALUATION   Patient Name: [name] [name] MRN: [id number] DOB:11-Nov-1950, 72 y.o., male Today's Date: 07/20/2023  PCP: Arva Chafe, MD REFERRING PROVIDER: Kerin Salen, DO  END OF SESSION:  End of Session - 07/20/23 1649     Visit Number 1    Number of Visits 17    Date for SLP Re-Evaluation 09/18/23    SLP Start Time 1447    SLP Stop Time  1522    SLP Time Calculation (min) 35 min    Activity Tolerance Patient tolerated treatment well             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/27/2014   Mild neurocognitive disorder due to Parkinson's disease 01/10/2022   Non-Hodgkin lymphoma 2007   diffuse- 6 cycles of chemo with R CHOP Rituxan - last dose 10/08   Nonspecific elevation of levels of transaminase or lactic acid dehydrogenase (LDH) 07/06/2008   Parkinson's disease 07/27/2014   Post-splenectomy 04/02/2015   Past Surgical History:  Procedure Laterality Date   CHOLECYSTECTOMY, LAPAROSCOPIC     EYE SURGERY     x2 as child   PORT-A-CATH REMOVAL     PORTACATH PLACEMENT     SPLENECTOMY     TONSILLECTOMY     Patient Active Problem List   Diagnosis Date Noted   Mild neurocognitive disorder due to Parkinson's disease 01/10/2022   Gastroesophageal reflux disease 03/07/2020   Localized swelling of right lower extremity 04/12/2019   DOE (dyspnea on exertion) 07/15/2018   10 year risk of MI or stroke 7.5% or greater 04/12/2018   Post-splenectomy 04/02/2015   Meige syndrome (blepharospasm with oromandibular dystonia)  07/27/2014   Dysphagia, neurologic 07/27/2014   Parkinson's disease (HCC) 07/27/2014   Dystonia 07/10/2014   Low back pain 11/21/2011   Nonspecific elevation of levels of transaminase or lactic acid dehydrogenase (LDH) 07/06/2008   Enlarged lymph nodes 07/28/2007   Non-Hodgkin lymphoma 03/23/2007    ONSET DATE: Dx in mid-2010's; script dated 06/04/23  REFERRING DIAG:  G20.A1 (ICD-10-CM) - Parkinson's disease without dyskinesia or fluctuating manifestations (HCC)      THERAPY DIAG:  Dysarthria and anarthria  Cognitive communication deficit  Rationale for Evaluation and Treatment: Rehabilitation  SUBJECTIVE:   SUBJECTIVE STATEMENT: "It's pretty bad, Meliza Kage." Pt accompanied by: self  PERTINENT HISTORY: Meige syndrome, insomnia, RBD. Pt well known to this SLP - previous ST course d/c'd in April 2024 with pt having met all LTGs.   PAIN:  Are you having pain? No  FALLS: Has patient fallen in last 6 months?  No  LIVING ENVIRONMENT: Lives with: lives with their spouse Lives in: House/apartment  PLOF:  Level of assistance: Independent with ADLs, Independent with IADLs Employment: Retired  PATIENT GOALS: Improve with speech loudness   OBJECTIVE:  Note: Objective measures were completed at Evaluation unless otherwise noted.  DIAGNOSTIC FINDINGS:  From neuropsych evaluation report dated April 2023:  "Mr. Ordoyne completed a comprehensive neuropsychological evaluation on 01/10/2022. Please refer to that encounter for the full report and recommendations. Briefly, results suggested isolated impaired performances across complex attention and  semantic fluency. Performance variability was also exhibited across all aspects of learning and memory. Regarding etiology, the most likely culprit for ongoing cognitive weakness remains his past history of Parkinson's disease. While processing speed was improved relative to what might be expected, dysfunction surrounding complex attention and  encoding/retrieval deficits of memory are quite common in this illness. The fact that motor/gait dysfunction was noted years prior to concern surrounding cognitive decline is also a typical timeline associated with Parkinson's disease. Mild anxiety and sleep dysfunction could further influence cognitive performances. Research surrounding cognitive side effects of pramipexole is mixed; however, I cannot rule out an ongoing side effect contribution as well."   COGNITION: Overall cognitive status: Impaired Areas of impairment: Attention and Memory Comments: cont to express examples of difficulty with divided attention; Compensates as in last course of tx and uses alternating attention instead.  MOTOR SPEECH: Overall motor speech: impaired Level of impairment: Word Respiration: thoracic breathing, clavicular breathing, and speaking on residual capacity Phonation: low vocal intensity low 60s dB Resonance: WFL Articulation: Impaired: conversation  Intelligibility: Intelligibility reduced ;95% (less than previous evaluation which was 95-100%) Interfering components:  Effective technique: increased vocal intensity  ORAL MOTOR EXAMINATION: Overall status: Impaired: Labial: Bilateral (ROM and Coordination) Lingual: Bilateral (ROM and Coordination) Facial: Left (Symmetry) Velum: Coordination (minimally sluggish) Comments: left eyelid and cheek musculature appears slightly drooped to same degree as at time of last evaluation.  OBJECTIVE VOICE ASSESSMENT: Sustained "ah" maximum phonation time: 12.67 seconds Sustained "ah" loudness average: 86 dB Oral reading (passage) loudness average: 68 dB (everyday sentences) Oral reading loudness range: 60-71 dB Conversational loudness average: 62 dB Conversational loudness range: sub-60dB - 65 dB Voice quality: breathy, strained, and aphonic Stimulability trials: Given SLP modeling and occasional min cues, loudness average increased to 68dB (range of 63 to  72) in 2 minutes of short simple conversation.  Comments: Pt was successful with cues very much like his last evaluation in early 2024. SLP suspects pt will have success in improving speech loudness during a new course of ST treatment.  Completed audio recording of patients baseline voice without cueing from SLP: Yes  Pt does not report difficulty with swallowing which does not warrant further evaluation.  PATIENT REPORTED OUTCOME MEASURES (PROM): Communication Effectiveness Survey: provided in first therapy session  TODAY'S TREATMENT:                                                                                                                                         DATE:  07/20/23: SLP worked with pt with digital recorder to incr awareness of his softer voice and to re-orient pt to louder, WNL volume speech. SLP was able to assist pt with reps of loud /a/ and his everyday sentences; SLP verified pt's everyday sentences would all still be pertinent/relevant. Pt decided he needed to change one of them and SLP told him to pay close attention  to things he says until next session and possibly find a phrase/sentence to substitute into his list.  PATIENT EDUCATION: Education details: Review eval results, everyday sentence review, and prognosis for recovering speech loudness Person educated: Patient Education method: Explanation Education comprehension: verbalized understanding  HOME EXERCISE PROGRAM: Loud /a/ and everyday sentences, along with speech tasks matching ability level   GOALS: Goals reviewed with patient? Yes  SHORT TERM GOALS: Target date: 08/21/23  Pt will produce loud /a/ with at least low 90s dB average over three sessions  Baseline: Goal status: INITIAL  2.  Pt will produce 16/20 sentence responses with 70dB over two sessions  Baseline:  Goal status: INITIAL  3.  Pt will produce volume average 69dB in 5 minutes simple conversation with rare min A over three sessions   Baseline:  Goal status: INITIAL  4.  Pt will generate abdominal breathing 80% of the time when engaging in 5 minutes simple conversation in 2 sessions  Baseline:  Goal status: INITIAL   LONG TERM GOALS: Target date: 09/18/23  Pt will maintain average 69dB over 8 minute simple-mod complex conversation with rare min A in three sessions  Baseline:  Goal status: INITIAL  2.  Pt will remain 100% intelligible out of the speech room for 10 minute conversation, with rare nonverbal cues Baseline:  Goal status: INITIAL  3.  pt will use abdominal breathing 75% of the time in 8 minutes conversation in three sessions  Baseline:  Goal status: INITIAL  4.  Pt will score higher on CES than initial administration  Baseline:  Goal status: INITIAL   ASSESSMENT:  CLINICAL IMPRESSION: Patient is a 72 y.o. male who was seen today for assessment of speech clarity who demonstrated mod dysarthria and reduced breath support due to Parkinson's disease. When asked to produce speech with same effort as loud /a/ pt improved volume in 2 minutes of simple conversation with average of 68dB with occasional min cues from SLP. Pt would benefit from skilled ST targeting loudness in conversation. At this time he does not endorse swallowing difficulty but SLP will cont to monitor informally and goal/s added PRN. Memory compensations targeted previous therapy course are reportedly still helpful for pt.  OBJECTIVE IMPAIRMENTS: Objective impairments include attention, memory, and dysarthria. These impairments are limiting patient from ADLs/IADLs and effectively communicating at home and in community.Factors affecting potential to achieve goals and functional outcome are previous level of function and severity of impairments.. Patient will benefit from skilled SLP services to address above impairments and improve overall function.  REHAB POTENTIAL: Good  PLAN:  SLP FREQUENCY: 2x/week  SLP DURATION: 8  weeks  PLANNED INTERVENTIONS: 92507 Treatment of speech (30 or 45 min) , Environmental controls, Cueing hierachy, Internal/external aids, Functional tasks, Multimodal communication approach, SLP instruction and feedback, Compensatory strategies, and Patient/family education    [facility], CCC-SLP 07/20/2023, 4:50 PM

## 2023-07-22 ENCOUNTER — Ambulatory Visit: Payer: Medicare Other

## 2023-07-22 DIAGNOSIS — R29818 Other symptoms and signs involving the nervous system: Secondary | ICD-10-CM | POA: Diagnosis not present

## 2023-07-22 DIAGNOSIS — M6281 Muscle weakness (generalized): Secondary | ICD-10-CM

## 2023-07-22 DIAGNOSIS — R278 Other lack of coordination: Secondary | ICD-10-CM | POA: Diagnosis not present

## 2023-07-22 DIAGNOSIS — R2689 Other abnormalities of gait and mobility: Secondary | ICD-10-CM

## 2023-07-22 DIAGNOSIS — R2681 Unsteadiness on feet: Secondary | ICD-10-CM | POA: Diagnosis not present

## 2023-07-22 DIAGNOSIS — G20A1 Parkinson's disease without dyskinesia, without mention of fluctuations: Secondary | ICD-10-CM

## 2023-07-22 DIAGNOSIS — R293 Abnormal posture: Secondary | ICD-10-CM | POA: Diagnosis not present

## 2023-07-22 DIAGNOSIS — R471 Dysarthria and anarthria: Secondary | ICD-10-CM

## 2023-07-22 DIAGNOSIS — R41841 Cognitive communication deficit: Secondary | ICD-10-CM

## 2023-07-22 NOTE — Therapy (Signed)
OUTPATIENT SPEECH LANGUAGE PATHOLOGY PARKINSON'S EVALUATION   Patient Name: [name] [name] MRN: [id number] DOB:06/06/1951, 72 y.o., male Today's Date: 07/22/2023  PCP: Arva Chafe, MD REFERRING PROVIDER: Kerin Salen, DO  END OF SESSION:  End of Session - 07/22/23 1459     Visit Number 2    Number of Visits 17    Date for SLP Re-Evaluation 09/18/23    SLP Start Time 1451    SLP Stop Time  1530    SLP Time Calculation (min) 39 min    Activity Tolerance Patient tolerated treatment well             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/27/2014   Mild neurocognitive disorder due to Parkinson's disease 01/10/2022   Non-Hodgkin lymphoma 2007   diffuse- 6 cycles of chemo with R CHOP Rituxan - last dose 10/08   Nonspecific elevation of levels of transaminase or lactic acid dehydrogenase (LDH) 07/06/2008   Parkinson's disease 07/27/2014   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/2015   Past Surgical History:  Procedure Laterality Date   CHOLECYSTECTOMY, LAPAROSCOPIC     EYE SURGERY     x2 as child   PORT-A-CATH REMOVAL     PORTACATH PLACEMENT     SPLENECTOMY     TONSILLECTOMY     Patient Active Problem List   Diagnosis Date Noted   Mild neurocognitive disorder due to Parkinson's disease 01/10/2022   Gastroesophageal reflux disease 03/07/2020   Localized swelling of right lower extremity 04/12/2019   DOE (dyspnea on exertion) 07/15/2018   10 year risk of MI or stroke 7.5% or greater 04/12/2018   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/02/2015   Meige syndrome (blepharospasm with oromandibular dystonia)  07/27/2014   Dysphagia, neurologic 07/27/2014   Parkinson's disease (HCC) 07/27/2014   Dystonia 07/10/2014   Low back pain 11/21/2011   Nonspecific elevation of levels of transaminase or lactic acid dehydrogenase (LDH) 07/06/2008   Enlarged lymph nodes 07/28/2007   Non-Hodgkin lymphoma 03/23/2007    ONSET DATE: Dx in mid-2010's; script dated 06/04/23  REFERRING DIAG:  G20.A1 (ICD-10-CM) - Parkinson's disease without dyskinesia or fluctuating manifestations (HCC)      THERAPY DIAG:  Dysarthria and anarthria  Cognitive communication deficit  Rationale for Evaluation and Treatment: Rehabilitation  SUBJECTIVE:   SUBJECTIVE STATEMENT: "How are you?" (WNL loudness) Pt accompanied by: self  PERTINENT HISTORY: Meige syndrome, insomnia, RBD. Pt well known to this SLP - previous ST course d/c'd in April 2024 with pt having met all LTGs.   PAIN:  Are you having pain? Yes: NPRS scale: 2/10 Pain location: lt shoulder Pain description: sore Aggravating factors: IDK Relieving factors: certain positioning  FALLS: Has patient fallen in last 6 months?  No  PATIENT GOALS: Improve with speech loudness   OBJECTIVE:  Note: Objective measures were completed at Evaluation unless otherwise noted.  DIAGNOSTIC FINDINGS:  From neuropsych evaluation report dated April 2023:  "Mr. Sahm completed a comprehensive neuropsychological evaluation on 01/10/2022. Please refer to that encounter for the full report and recommendations. Briefly, results suggested isolated impaired performances across complex attention and semantic fluency. Performance variability was also exhibited across  all aspects of learning and memory. Regarding etiology, the most likely culprit for ongoing cognitive weakness remains his past history of Parkinson's disease. While processing speed was improved relative to what might be expected, dysfunction surrounding complex attention and encoding/retrieval deficits of memory are quite  common in this illness. The fact that motor/gait dysfunction was noted years prior to concern surrounding cognitive decline is also a typical timeline associated with Parkinson's disease. Mild anxiety and sleep dysfunction could further influence cognitive performances. Research surrounding cognitive side effects of pramipexole is mixed; however, I cannot rule out an ongoing side effect contribution as well."  .  PATIENT REPORTED OUTCOME MEASURES (PROM): Communication Effectiveness Survey: pt scored himself 16/32 on 07/22/23, with lower scores indicating greater impact of deficit on speech effectiveness/lower QOL.  TODAY'S TREATMENT:       AB=Abdominal Breathing                                                                                                                                  DATE:  07/22/23: Pt will need work with AB next session. SLP targeted loud /a/ to improve muscle memory for incr'd speech loudness and clarity. Pt produced average 89dB with 5 reps. SLP told pt to incr reps to 7, BID. Pt thought of replacement everyday sentence to "What inning is it" however this will not be everyday so pt will look for another to substitute - he would like to keep this one for baseball season. SLP worked with pt on incr'ing his awareness of somatosensory input for WNL speech and he indicated incr in abdominal musculature recruitment. With single word responses pt req'd usual cues for loudness and intent to achieve loudness average 68dB. With phrase responses pt exhibited consistent loudness decay - SLP usual cues for loudness and intent/using abdominal musculature improved loudness across the phrase. Cues for posture and breath support were also helpful.  07/20/23: SLP worked with pt with digital recorder to incr awareness of his softer voice and to re-orient pt to louder, WNL volume speech. SLP was able to assist pt with reps of loud /a/ and his everyday sentences; SLP verified pt's everyday sentences  would all still be pertinent/relevant. Pt decided he needed to change one of them and SLP told him to pay close attention to things he says until next session and possibly find a phrase/sentence to substitute into his list.  PATIENT EDUCATION: Education details: Review eval results, everyday sentence review, and prognosis for recovering speech loudness Person educated: Patient Education method: Explanation Education comprehension: verbalized understanding  HOME EXERCISE PROGRAM: Loud /a/ and everyday sentences, along with speech tasks matching ability level   GOALS: Goals reviewed with patient? Yes  SHORT TERM GOALS: Target date: 08/21/23  Pt will produce loud /a/ with at least low 90s dB average over three sessions  Baseline: Goal status: INITIAL  2.  Pt will produce 16/20 sentence responses with 70dB over two sessions  Baseline:  Goal status: INITIAL  3.  Pt will produce volume average 69dB in 5 minutes simple conversation with rare min A over three sessions  Baseline:  Goal status: INITIAL  4.  Pt will generate abdominal breathing 80% of the time when engaging in 5 minutes simple conversation in 2 sessions  Baseline:  Goal status: INITIAL   LONG TERM GOALS: Target date: 09/18/23  Pt will maintain average 69dB over 8 minute simple-mod complex conversation with rare min A in three sessions  Baseline:  Goal status: INITIAL  2.  Pt will remain 100% intelligible out of the speech room for 10 minute conversation, with rare nonverbal cues Baseline:  Goal status: INITIAL  3.  pt will use abdominal breathing 75% of the time in 8 minutes conversation in three sessions  Baseline:  Goal status: INITIAL  4.  Pt will score higher on CES than initial administration  Baseline:  Goal status: INITIAL   ASSESSMENT:  CLINICAL IMPRESSION: Patient is a 72 y.o. male who was seen today for treatment of speech clarity who, on day of eval, demonstrated mod dysarthria and reduced  breath support due to Parkinson's disease. See "today's treatment" for more details on today's session. Pt would benefit from skilled ST targeting loudness in conversation. At this time he does not endorse swallowing difficulty but SLP will cont to monitor informally and goal/s added PRN. Memory compensations targeted previous therapy course are reportedly still helpful for pt.  OBJECTIVE IMPAIRMENTS: Objective impairments include attention, memory, and dysarthria. These impairments are limiting patient from ADLs/IADLs and effectively communicating at home and in community.Factors affecting potential to achieve goals and functional outcome are previous level of function and severity of impairments.. Patient will benefit from skilled SLP services to address above impairments and improve overall function.  REHAB POTENTIAL: Good  PLAN:  SLP FREQUENCY: 2x/week  SLP DURATION: 8 weeks  PLANNED INTERVENTIONS: 92507 Treatment of speech (30 or 45 min) , Environmental controls, Cueing hierachy, Internal/external aids, Functional tasks, Multimodal communication approach, SLP instruction and feedback, Compensatory strategies, and Patient/family education    [facility], CCC-SLP 07/22/2023, 3:00 PM

## 2023-07-22 NOTE — Therapy (Signed)
OUTPATIENT PHYSICAL THERAPY NEURO TREATMENT   Patient Name: [name] [name] MRN: [id number] DOB:[birth year], 72 y.o., male Today's Date: 07/22/2023   PCP: Sharlene Dory, DO REFERRING PROVIDER: Vladimir Faster, DO  END OF SESSION:  PT End of Session - 07/22/23 1402     Visit Number 7    Number of Visits 13    Date for PT Re-Evaluation 08/07/23    Authorization Type Medicare/BCBS-Pt is KX    Progress Note Due on Visit 10    PT Start Time 1400    PT Stop Time 1445    PT Time Calculation (min) 45 min    Equipment Utilized During Treatment Gait belt    Activity Tolerance Patient tolerated treatment well    Behavior During Therapy WFL for tasks assessed/performed              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/27/2014   Mild neurocognitive disorder due to Parkinson's disease 01/10/2022   Non-Hodgkin lymphoma 2007   diffuse- 6 cycles of chemo with R CHOP Rituxan - last dose 10/08   Nonspecific elevation of levels of transaminase or lactic acid dehydrogenase (LDH) 07/06/2008   Parkinson's disease 07/27/2014   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/2015   Past Surgical History:  Procedure Laterality Date   CHOLECYSTECTOMY, LAPAROSCOPIC     EYE SURGERY     x2 as child   PORT-A-CATH REMOVAL     PORTACATH PLACEMENT     SPLENECTOMY     TONSILLECTOMY     Patient Active Problem List   Diagnosis Date Noted   Mild neurocognitive disorder due to Parkinson's disease 01/10/2022   Gastroesophageal reflux disease 03/07/2020   Localized swelling of right lower extremity 04/12/2019   DOE  (dyspnea on exertion) 07/15/2018   10 year risk of MI or stroke 7.5% or greater 04/12/2018   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/2015   Meige syndrome (blepharospasm with oromandibular dystonia) 07/27/2014   Dysphagia, neurologic 07/27/2014   Parkinson's disease (HCC) 07/27/2014   Dystonia 07/10/2014   Low back pain 11/21/2011   Nonspecific elevation of levels of transaminase or lactic acid dehydrogenase (LDH) 07/06/2008   Enlarged lymph nodes 07/28/2007   Non-Hodgkin lymphoma 03/23/2007    ONSET DATE: 06/04/2023 (MD referral)  REFERRING DIAG: G20.A1 (ICD-10-CM) - Parkinson's disease without dyskinesia or fluctuating manifestations (HCC)   THERAPY DIAG:  Unsteadiness on feet  Other abnormalities of gait and mobility  Muscle weakness (generalized)  Parkinson's disease without dyskinesia or fluctuating manifestations (HCC)  Abnormal posture  Rationale for Evaluation and Treatment: Rehabilitation  SUBJECTIVE:  SUBJECTIVE STATEMENT: Doing ok, left shoulder is a little sore Pt accompanied by: self  PERTINENT HISTORY: Hx of Meige syndrome, insomnia, RBD, PD  PAIN:  Are you having pain? No  PRECAUTIONS: Fall  RED FLAGS: None   WEIGHT BEARING RESTRICTIONS: No  FALLS: Has patient fallen in last 6 months? No  LIVING ENVIRONMENT: Lives with: lives with their family Lives in: House/apartment Stairs: 3 floors, stair lift for w/c for wife; keeps cane on third floor Has following equipment at home: Single point cane and Quad cane large base  PLOF: Independent with household mobility with device  PATIENT GOALS: Goals for therapy are to get back to feeling like I was before I got sick (June/July); just want to move better.   OBJECTIVE:    TODAY'S TREATMENT: 07/22/23 Activity Comments  Treadmill  training  -2 min warm up 1.8-2 mph -30 sec speed intervals 2.8-3 mph -30 sec slow intervals 2.2 mph to 6 min mark. 98 bpm at conclusion  Reaction training: 1.3 mph dropping bean bags on belt and required to step over/change stride x 3 min  4-square step -typical procedure x 3 laps clockwise/counter -w/ dual-tasking -w/ multitasking  Lateral steps over canes -w/ multitasking  Gait in narrow spaces To improve safety with navigating closet at home--teach back of freezing of gait strategies -retrowalking 4x25 ft            TODAY'S TREATMENT: 07/20/23 Activity Comments  TM walking 1-51mph in intervals totaling 6 min  For neural priming   Heels catching occasionally   lunge walk + twist  CGA, cues for longer steps, looking ahead, coordinating UEs   high step + reciprocal arm swing Difficulty  coordinating this; Trialed in II bars 1st for safety, then CGA with cues to reset   Black medball weighted step ups 6" 2x10  More effortful today; cues to place feet fully onto step for safety;   gait + head turns/nods  CGA-min A; c/o mild dizziness        Note: Objective measures below were completed at Evaluation unless otherwise noted.  DIAGNOSTIC FINDINGS: NA  COGNITION: Overall cognitive status: Within functional limits for tasks assessed    POSTURE: rounded shoulders, forward head, and posterior pelvic tilt  LOWER EXTREMITY ROM:   AROM WFL in sitting  LOWER EXTREMITY MMT:    MMT Right Eval Left Eval  Hip flexion 4 4  Hip extension    Hip abduction 4 4  Hip adduction 4+ 4+  Hip internal rotation    Hip external rotation    Knee flexion 3+ 3+  Knee extension 4 4  Ankle dorsiflexion 3+ 3+  Ankle plantarflexion    Ankle inversion    Ankle eversion    (Blank rows = not tested)   TRANSFERS: Assistive device utilized: None  Sit to stand: Modified independence Stand to sit: Modified independence  GAIT: Gait pattern: step through pattern, decreased stride length,  festinating, trunk flexed, poor foot clearance- Right, and poor foot clearance- Left Distance walked: 60 ft Assistive device utilized: Quad cane large base Level of assistance: Modified independence Comments: Carries cane some during eval  FUNCTIONAL TESTS:  5 times sit to stand: 10.53 sec Timed up and go (TUG): 15.28 sec with decreased eccentric control to sit Dynamic Gait Index: 14/24 TUG cognitive:  15.81 sec  with decreased eccentric control to sit 360 turns to L:  10 steps, 4.44 sec 360 turn to R:  14 steps, 7.12 sec 31M:  10.94 sec =  3 ft/sec      PATIENT EDUCATION: Education details: Eval results, POC Person educated: Patient Education method: Explanation Education comprehension: verbalized understanding  HOME EXERCISE PROGRAM: Initiated sit<>stand with hands on knees with cues for eccentric control to sit, 3 x 5 reps, once/day  GOALS: Goals reviewed with patient? Yes  SHORT TERM GOALS: Target date: 07/24/2023  Pt will be independent with HEP for improved strength, balance, gait. Baseline: Goal status: IN PROGRESS  2.  Pt will improve TUG score to less than or equal to 13 sec for decreased fall risk. Baseline: 15.28 sec Goal status: IN PROGRESS  3.  Pt will improve DGI score to at least 17/24 to decrease fall risk. Baseline: 14/24 Goal status: IN PROGRESS  LONG TERM GOALS: Target date: 08/07/2023  Pt will be independent with HEP for improved strength, balance, gait. Baseline:  Goal status: INITIAL  2.  Pt will improve TUG cognitive score to less than or equal to 13 sec for decreased fall risk. Baseline: 15.81 sec Goal status: INITIAL  3.  Pt will improve DGI score to at least 20/24 to decrease fall risk. Baseline: 14/24 Goal status: INITIAL  4.  Pt will verbalize understanding of ongoing community fitness upon d/c from PT.   Baseline:  Goal status: INITIAL  5.  to 1,280 ft to meet disease-matched norms  Baseline: 950 ft w/ NBQC  Goal status:   INITIAL  ASSESSMENT:  CLINICAL IMPRESSION: Initiated with treadmill training initially for increasing gait speed tolerating up to 3 mph for 30 sec intervals contrasted with slower pace to 2.2 mph in order to improve gait speed and control. Activity for reaction timing and obstacle negotiation requiring sudden change of stride and stepping over obstaces dropped on treadmill with great control and coordination of step length/stride length change (UE support throughout).  Coordination/balance activities to improve obstacle mgmt and requiring dual-tasking and multitasking demands to improve fluidity and demands of dual/multi-tasking.  Demo 3 instances of unsteadiness during multi-tasking with faster pace.  Addressed issues of freezing of gait construtcing narrow hallway with pt exhibiting onset of freezing and difficulty with initiating steps under these demands.  Demo good teach back of strategy using cane to push self forward. Retro walking to improve ability to negotiate narrow spaces and facilitate hip extension for stepping strategy. Continued sessions to progress POC details  OBJECTIVE IMPAIRMENTS: Abnormal gait, decreased balance, decreased knowledge of use of DME, decreased mobility, difficulty walking, decreased strength, and postural dysfunction.   ACTIVITY LIMITATIONS: standing, transfers, and locomotion level  PARTICIPATION LIMITATIONS: community activity, church, and community fitness  PERSONAL FACTORS: 3+ comorbidities: PMH  are also affecting patient's functional outcome.   REHAB POTENTIAL: Good  CLINICAL DECISION MAKING: Evolving/moderate complexity  EVALUATION COMPLEXITY: Moderate  PLAN:  PT FREQUENCY: 2x/week  PT DURATION: 6 weeks plus eval  PLANNED INTERVENTIONS: Therapeutic exercises, Therapeutic activity, Neuromuscular re-education, Balance training, Gait training, Patient/Family education, Self Care, Stair training, and DME instructions  PLAN FOR NEXT SESSION: Initiate  HEP (pt states performing sit<>stand and sidestepping at home, but not sure he has new Med[employer] code this bout of therapy yet), work more on sit<>stand, step ups, lower extremity strengthening.  FoG activities    2:49 PM, 07/22/23 M. Shary Decamp, PT, DPT Physical Therapist- [place] Office Number: [phone]

## 2023-07-25 ENCOUNTER — Other Ambulatory Visit: Payer: Self-pay | Admitting: Neurology

## 2023-07-25 DIAGNOSIS — G20A2 Parkinson's disease without dyskinesia, with fluctuations: Secondary | ICD-10-CM

## 2023-07-27 ENCOUNTER — Ambulatory Visit: Payer: Medicare Other | Attending: Neurology

## 2023-07-27 ENCOUNTER — Ambulatory Visit: Payer: Medicare Other

## 2023-07-27 DIAGNOSIS — R41841 Cognitive communication deficit: Secondary | ICD-10-CM | POA: Insufficient documentation

## 2023-07-27 DIAGNOSIS — M6281 Muscle weakness (generalized): Secondary | ICD-10-CM | POA: Diagnosis not present

## 2023-07-27 DIAGNOSIS — R293 Abnormal posture: Secondary | ICD-10-CM | POA: Insufficient documentation

## 2023-07-27 DIAGNOSIS — R2681 Unsteadiness on feet: Secondary | ICD-10-CM | POA: Diagnosis not present

## 2023-07-27 DIAGNOSIS — R29818 Other symptoms and signs involving the nervous system: Secondary | ICD-10-CM | POA: Insufficient documentation

## 2023-07-27 DIAGNOSIS — R471 Dysarthria and anarthria: Secondary | ICD-10-CM

## 2023-07-27 DIAGNOSIS — G20A1 Parkinson's disease without dyskinesia, without mention of fluctuations: Secondary | ICD-10-CM | POA: Insufficient documentation

## 2023-07-27 DIAGNOSIS — R2689 Other abnormalities of gait and mobility: Secondary | ICD-10-CM | POA: Diagnosis not present

## 2023-07-27 NOTE — Therapy (Signed)
OUTPATIENT SPEECH LANGUAGE PATHOLOGY PARKINSON'S TREATMENT   Patient Name: [name] [name] MRN: [id number] DOB:1950/10/28, 72 y.o., male Today's Date: 07/27/2023  PCP: Arva Chafe, MD REFERRING PROVIDER: Kerin Salen, DO  END OF SESSION:  End of Session - 07/27/23 1533     Visit Number 3    Number of Visits 17    Date for SLP Re-Evaluation 09/18/23    SLP Start Time 1453    SLP Stop Time  1533    SLP Time Calculation (min) 40 min    Activity Tolerance Patient tolerated treatment well              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/27/2014   Mild neurocognitive disorder due to Parkinson's disease 01/10/2022   Non-Hodgkin lymphoma 2007   diffuse- 6 cycles of chemo with R CHOP Rituxan - last dose 10/08   Nonspecific elevation of levels of transaminase or lactic acid dehydrogenase (LDH) 07/06/2008   Parkinson's disease 07/27/2014   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/02/2015   Past Surgical History:  Procedure Laterality Date   CHOLECYSTECTOMY, LAPAROSCOPIC     EYE SURGERY     x2 as child   PORT-A-CATH REMOVAL     PORTACATH PLACEMENT     SPLENECTOMY     TONSILLECTOMY     Patient Active Problem List   Diagnosis Date Noted   Mild neurocognitive disorder due to Parkinson's disease 01/10/2022   Gastroesophageal reflux disease 03/07/2020   Localized swelling of right lower extremity 04/12/2019   DOE (dyspnea on exertion) 07/15/2018   10 year risk of MI or stroke 7.5% or greater 04/12/2018   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/02/2015   Meige syndrome (blepharospasm with oromandibular dystonia)  07/27/2014   Dysphagia, neurologic 07/27/2014   Parkinson's disease (HCC) 07/27/2014   Dystonia 07/10/2014   Low back pain 11/21/2011   Nonspecific elevation of levels of transaminase or lactic acid dehydrogenase (LDH) 07/06/2008   Enlarged lymph nodes 07/28/2007   Non-Hodgkin lymphoma 03/23/2007    ONSET DATE: Dx in mid-2010's; script dated 06/04/23  REFERRING DIAG:  G20.A1 (ICD-10-CM) - Parkinson's disease without dyskinesia or fluctuating manifestations (HCC)      THERAPY DIAG:  Dysarthria and anarthria  Cognitive communication deficit  Rationale for Evaluation and Treatment: Rehabilitation  SUBJECTIVE:   SUBJECTIVE STATEMENT: "I found it hard to say what I was thinking, and to be loud." (Pt, re: Sunday school class yesterday) Pt accompanied by: self  PERTINENT HISTORY: Meige syndrome, insomnia, RBD. Pt well known to this SLP - previous ST course d/c'd in April 2024 with pt having met all LTGs.   PAIN:  Are you having pain? No  FALLS: Has patient fallen in last 6 months?  No  PATIENT GOALS: Improve with speech loudness   OBJECTIVE:  Note: Objective measures were completed at Evaluation unless otherwise noted.  DIAGNOSTIC FINDINGS:  From neuropsych evaluation report dated April 2023:  "Mr. Crable completed a comprehensive neuropsychological evaluation on 01/10/2022. Please refer to that encounter for the full report and recommendations. Briefly, results suggested isolated impaired performances across complex attention and semantic fluency. Performance variability was also exhibited across all  aspects of learning and memory. Regarding etiology, the most likely culprit for ongoing cognitive weakness remains his past history of Parkinson's disease. While processing speed was improved relative to what might be expected, dysfunction surrounding complex attention and encoding/retrieval deficits of memory are quite common in this illness. The fact that motor/gait dysfunction was  noted years prior to concern surrounding cognitive decline is also a typical timeline associated with Parkinson's disease. Mild anxiety and sleep dysfunction could further influence cognitive performances. Research surrounding cognitive side effects of pramipexole is mixed; however, I cannot rule out an ongoing side effect contribution as well."  .  PATIENT REPORTED OUTCOME MEASURES (PROM): Communication Effectiveness Survey: pt scored himself 16/32 on 07/22/23, with lower scores indicating greater impact of deficit on speech effectiveness/lower QOL.  TODAY'S TREATMENT:       AB=Abdominal Breathing                                                                                                                                  DATE:  07/27/23: In 6 minutes of conversation pt began 75% utterances with volume average 67dB but had loudness fade by end of utterance. SLP targeted loud /a/ to improve muscle memory for incr'd speech loudness and clarity. Pt produced average 91dB with 7 reps. 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   07/22/23: Pt will need work with AB next session. SLP targeted loud /a/ to improve muscle memory for incr'd speech loudness and clarity. Pt produced average 89dB with 5 reps. SLP told pt to incr reps to 7, BID. Pt thought of replacement everyday sentence to "What inning is it" however this will not be everyday so pt will look for another to substitute - he would like to keep this one for baseball season. SLP worked with pt on incr'ing his awareness of somatosensory input for WNL speech and he indicated incr in abdominal musculature recruitment. With single word responses pt req'd usual cues for loudness and intent to achieve loudness average 68dB. With phrase responses pt exhibited consistent loudness decay -  SLP usual cues for loudness and intent/using abdominal musculature improved loudness across the phrase. Cues for posture and breath support were also helpful.  07/20/23: SLP worked with pt with digital recorder to incr awareness of his softer voice and to re-orient pt to louder, WNL volume speech. SLP was able to assist pt with reps of loud /a/ and his everyday sentences; SLP verified pt's everyday sentences would all still be pertinent/relevant. Pt decided he needed to change one of them and SLP told him to pay close attention to things he says until next session and possibly find a phrase/sentence to substitute into his list.  PATIENT EDUCATION: Education details: Review eval results, everyday sentence review, and prognosis for  recovering speech loudness Person educated: Patient Education method: Explanation Education comprehension: verbalized understanding  HOME EXERCISE PROGRAM: Loud /a/ and everyday sentences, along with speech tasks matching ability level   GOALS: Goals reviewed with patient? Yes  SHORT TERM GOALS: Target date: 08/21/23  Pt will produce loud /a/ with at least low 90s dB average over three sessions  Baseline: 07/27/23 Goal status: INITIAL  2.  Pt will produce 16/20 sentence responses with 70dB over two sessions  Baseline:  Goal status: INITIAL  3.  Pt will produce volume average 69dB in 5 minutes simple conversation with rare min A over three sessions  Baseline:  Goal status: INITIAL  4.  Pt will generate abdominal breathing 80% of the time when engaging in 5 minutes simple conversation in 2 sessions  Baseline:  Goal status: INITIAL   LONG TERM GOALS: Target date: 09/18/23  Pt will maintain average 69dB over 8 minute simple-mod complex conversation with rare min A in three sessions  Baseline:  Goal status: INITIAL  2.  Pt will remain 100% intelligible out of the speech room for 10 minute conversation, with rare nonverbal cues Baseline:  Goal status:  INITIAL  3.  pt will use abdominal breathing 75% of the time in 8 minutes conversation in three sessions  Baseline:  Goal status: INITIAL  4.  Pt will score higher on CES than initial administration  Baseline:  Goal status: INITIAL   ASSESSMENT:  CLINICAL IMPRESSION: Patient is a 72 y.o. male who was seen today for treatment of speech clarity who, on day of eval, demonstrated mod dysarthria and reduced breath support due to Parkinson's disease. See "today's treatment" for more details on today's session. Pt would benefit from skilled ST targeting loudness in conversation. At this time he does not endorse swallowing difficulty but SLP will cont to monitor informally and goal/s added PRN. Memory compensations targeted previous therapy course are reportedly still helpful for pt.  OBJECTIVE IMPAIRMENTS: Objective impairments include attention, memory, and dysarthria. These impairments are limiting patient from ADLs/IADLs and effectively communicating at home and in community.Factors affecting potential to achieve goals and functional outcome are previous level of function and severity of impairments.. Patient will benefit from skilled SLP services to address above impairments and improve overall function.  REHAB POTENTIAL: Good  PLAN:  SLP FREQUENCY: 2x/week  SLP DURATION: 8 weeks  PLANNED INTERVENTIONS: 92507 Treatment of speech (30 or 45 min) , Environmental controls, Cueing hierachy, Internal/external aids, Functional tasks, Multimodal communication approach, SLP instruction and feedback, Compensatory strategies, and Patient/family education    [facility], CCC-SLP 07/27/2023, 3:34 PM

## 2023-07-27 NOTE — Therapy (Signed)
OUTPATIENT PHYSICAL THERAPY NEURO TREATMENT   Patient Name: [name] [name] MRN: [id number] DOB:March 14, 1951, 72 y.o., male Today's Date: 07/27/2023   PCP: Sharlene Dory, DO REFERRING PROVIDER: Vladimir Faster, DO  END OF SESSION:  PT End of Session - 07/27/23 1406     Visit Number 8    Number of Visits 13    Date for PT Re-Evaluation 08/07/23    Authorization Type Medicare/BCBS-Pt is KX    Progress Note Due on Visit 10    PT Start Time 1400    PT Stop Time 1445    PT Time Calculation (min) 45 min    Equipment Utilized During Treatment Gait belt    Activity Tolerance Patient tolerated treatment well    Behavior During Therapy WFL for tasks assessed/performed              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/27/2014   Mild neurocognitive disorder due to Parkinson's disease 01/10/2022   Non-Hodgkin lymphoma 2007   diffuse- 6 cycles of chemo with R CHOP Rituxan - last dose 10/08   Nonspecific elevation of levels of transaminase or lactic acid dehydrogenase (LDH) 07/06/2008   Parkinson's disease 07/27/2014   Post-splenectomy 04/02/2015   Past Surgical History:  Procedure Laterality Date   CHOLECYSTECTOMY, LAPAROSCOPIC     EYE SURGERY     x2 as child   PORT-A-CATH REMOVAL     PORTACATH PLACEMENT     SPLENECTOMY     TONSILLECTOMY     Patient Active Problem List   Diagnosis Date Noted   Mild neurocognitive disorder due to Parkinson's disease 01/10/2022   Gastroesophageal reflux disease 03/07/2020   Localized swelling of right lower extremity 04/12/2019   DOE  (dyspnea on exertion) 07/15/2018   10 year risk of MI or stroke 7.5% or greater 04/12/2018   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/02/2015   Meige syndrome (blepharospasm with oromandibular dystonia) 07/27/2014   Dysphagia, neurologic 07/27/2014   Parkinson's disease (HCC) 07/27/2014   Dystonia 07/10/2014   Low back pain 11/21/2011   Nonspecific elevation of levels of transaminase or lactic acid dehydrogenase (LDH) 07/06/2008   Enlarged lymph nodes 07/28/2007   Non-Hodgkin lymphoma 03/23/2007    ONSET DATE: 06/04/2023 (MD referral)  REFERRING DIAG: G20.A1 (ICD-10-CM) - Parkinson's disease without dyskinesia or fluctuating manifestations (HCC)   THERAPY DIAG:  Unsteadiness on feet  Other abnormalities of gait and mobility  Muscle weakness (generalized)  Parkinson's disease without dyskinesia or fluctuating manifestations (HCC)  Abnormal posture  Other symptoms and signs involving the nervous system  Rationale for Evaluation and Treatment: Rehabilitation  SUBJECTIVE:  SUBJECTIVE STATEMENT: Doing ok, left shoulder has a little twinge Pt accompanied by: self  PERTINENT HISTORY: Hx of Meige syndrome, insomnia, RBD, PD  PAIN:  Are you having pain? No  PRECAUTIONS: Fall  RED FLAGS: None   WEIGHT BEARING RESTRICTIONS: No  FALLS: Has patient fallen in last 6 months? No  LIVING ENVIRONMENT: Lives with: lives with their family Lives in: House/apartment Stairs: 3 floors, stair lift for w/c for wife; keeps cane on third floor Has following equipment at home: Single point cane and Quad cane large base  PLOF: Independent with household mobility with device  PATIENT GOALS: Goals for therapy are to get back to feeling like I was before I got sick (June/July); just want to move better.   OBJECTIVE:    TODAY'S TREATMENT: 07/27/23 Activity Comments  NU-step HIIT intervals x 8 min 2 min warm up 30 sec level 6 and 100+ SPM 30 sec level 3 50-70 SPM 1 min cooldown  87 bpm at start, 97 bpm during efforts, rates 10/10 RPE  Sit to stand 1x5 w/ blue med ball -regular -with a chest pass -alt knee tap  Gross motor coordination 1x10 -various throwing forms with blue medicine ball  Dynamic balance -retrowalking over 4 hurdles -4 square step: clockwise/counter           TODAY'S TREATMENT: 07/22/23 Activity Comments  Treadmill training  -2 min warm up 1.8-2 mph -30 sec speed intervals 2.8-3 mph -30 sec slow intervals 2.2 mph to 6 min mark. 98 bpm at conclusion  Reaction training: 1.3 mph dropping bean bags on belt and required to step over/change stride x 3 min  4-square step -typical procedure x 3 laps clockwise/counter -w/ dual-tasking -w/ multitasking  Lateral steps over canes -w/ multitasking  Gait in narrow spaces To improve safety with navigating closet at home--teach back of freezing of gait strategies -retrowalking 4x25 ft           PATIENT EDUCATION: Education details: rationale and examples of HIIT as it pertains to PD Person educated: Patient Education method: Explanation Education comprehension: verbalized understanding  HOME EXERCISE PROGRAM: Initiated sit<>stand with hands on knees with cues for eccentric control to sit, 3 x 5 reps, once/day  Note: Objective measures below were completed at Evaluation unless otherwise noted.  DIAGNOSTIC FINDINGS: NA  COGNITION: Overall cognitive status: Within functional limits for tasks assessed    POSTURE: rounded shoulders, forward head, and posterior pelvic tilt  LOWER EXTREMITY ROM:   AROM WFL in sitting  LOWER EXTREMITY MMT:    MMT Right Eval Left Eval  Hip flexion 4 4  Hip extension    Hip abduction 4 4  Hip adduction 4+ 4+  Hip internal rotation    Hip external rotation    Knee flexion 3+ 3+  Knee  extension 4 4  Ankle dorsiflexion 3+ 3+  Ankle plantarflexion    Ankle inversion    Ankle eversion    (Blank rows = not tested)   TRANSFERS: Assistive device utilized: None  Sit to stand: Modified independence Stand to sit: Modified independence  GAIT: Gait pattern: step through pattern, decreased stride length, festinating, trunk flexed, poor foot clearance- Right, and poor foot clearance- Left Distance walked: 60 ft Assistive device utilized: Quad cane large base Level of assistance: Modified independence Comments: Carries cane some during eval  FUNCTIONAL TESTS:  5 times sit to stand: 10.53 sec Timed up and go (TUG): 15.28 sec with decreased eccentric control to sit Dynamic Gait Index: 14/24 TUG  cognitive:  15.81 sec  with decreased eccentric control to sit 360 turns to L:  10 steps, 4.44 sec 360 turn to R:  14 steps, 7.12 sec 30M:  10.94 sec = 3 ft/sec        GOALS: Goals reviewed with patient? Yes  SHORT TERM GOALS: Target date: 07/24/2023  Pt will be independent with HEP for improved strength, balance, gait. Baseline: Goal status: IN PROGRESS  2.  Pt will improve TUG score to less than or equal to 13 sec for decreased fall risk. Baseline: 15.28 sec Goal status: IN PROGRESS  3.  Pt will improve DGI score to at least 17/24 to decrease fall risk. Baseline: 14/24 Goal status: IN PROGRESS  LONG TERM GOALS: Target date: 08/07/2023  Pt will be independent with HEP for improved strength, balance, gait. Baseline:  Goal status: INITIAL  2.  Pt will improve TUG cognitive score to less than or equal to 13 sec for decreased fall risk. Baseline: 15.81 sec Goal status: INITIAL  3.  Pt will improve DGI score to at least 20/24 to decrease fall risk. Baseline: 14/24 Goal status: INITIAL  4.  Pt will verbalize understanding of ongoing community fitness upon d/c from PT.   Baseline:  Goal status: INITIAL  5.  to 1,280 ft to meet disease-matched  norms  Baseline: 950 ft w/ NBQC  Goal status:  INITIAL  ASSESSMENT:  CLINICAL IMPRESSION: Session initiated with HIIT on NU-step for benefit of metabolic training and benefits it imparts on those with PD and discussed rationale and physiology of this training in regards to PD and discussed community options (cycling class at [facility]) that performs this activity.  Continued with gross motor coordination and dynamic balance activities to improve motor control and ability to perform powerful and dissociative movements to improve fluidity and postural control with verbal cues during throwing tasks for full body contribution/coordination with improved performance/carryover.  Dynamic standing balance activities to improve ability to navigate backwards and single limb support with use of cane and managing obstacles requiring CGA due to instances of unsteadiness.  Continued sessions to progress POC details to improve mobility and reduce risk for falls  OBJECTIVE IMPAIRMENTS: Abnormal gait, decreased balance, decreased knowledge of use of DME, decreased mobility, difficulty walking, decreased strength, and postural dysfunction.   ACTIVITY LIMITATIONS: standing, transfers, and locomotion level  PARTICIPATION LIMITATIONS: community activity, church, and community fitness  PERSONAL FACTORS: 3+ comorbidities: PMH  are also affecting patient's functional outcome.   REHAB POTENTIAL: Good  CLINICAL DECISION MAKING: Evolving/moderate complexity  EVALUATION COMPLEXITY: Moderate  PLAN:  PT FREQUENCY: 2x/week  PT DURATION: 6 weeks plus eval  PLANNED INTERVENTIONS: Therapeutic exercises, Therapeutic activity, Neuromuscular re-education, Balance training, Gait training, Patient/Family education, Self Care, Stair training, and DME instructions  PLAN FOR NEXT SESSION:Check STG, progress HEP    2:07 PM, 07/27/23 M. Shary Decamp, PT, DPT Physical Therapist- [place] Office Number: [phone]

## 2023-07-29 ENCOUNTER — Ambulatory Visit: Payer: Medicare Other

## 2023-07-29 DIAGNOSIS — M6281 Muscle weakness (generalized): Secondary | ICD-10-CM

## 2023-07-29 DIAGNOSIS — R2689 Other abnormalities of gait and mobility: Secondary | ICD-10-CM

## 2023-07-29 DIAGNOSIS — G20A1 Parkinson's disease without dyskinesia, without mention of fluctuations: Secondary | ICD-10-CM | POA: Diagnosis not present

## 2023-07-29 DIAGNOSIS — R471 Dysarthria and anarthria: Secondary | ICD-10-CM

## 2023-07-29 DIAGNOSIS — R41841 Cognitive communication deficit: Secondary | ICD-10-CM

## 2023-07-29 DIAGNOSIS — R2681 Unsteadiness on feet: Secondary | ICD-10-CM

## 2023-07-29 DIAGNOSIS — R29818 Other symptoms and signs involving the nervous system: Secondary | ICD-10-CM | POA: Diagnosis not present

## 2023-07-29 DIAGNOSIS — R293 Abnormal posture: Secondary | ICD-10-CM | POA: Diagnosis not present

## 2023-07-29 NOTE — Therapy (Signed)
OUTPATIENT SPEECH LANGUAGE PATHOLOGY PARKINSON'S TREATMENT   Patient Name: [name] [name] MRN: [id number] DOB:Dec 29, 1950, 72 y.o., male Today's Date: 07/29/2023  PCP: Arva Chafe, MD REFERRING PROVIDER: Kerin Salen, DO  END OF SESSION:  End of Session - 07/29/23 1501     Visit Number 4    Number of Visits 17    Date for SLP Re-Evaluation 09/18/23    SLP Start Time 1450    SLP Stop Time  1533    SLP Time Calculation (min) 43 min    Activity Tolerance Patient tolerated treatment well              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/27/2014   Mild neurocognitive disorder due to Parkinson's disease 01/10/2022   Non-Hodgkin lymphoma 2007   diffuse- 6 cycles of chemo with R CHOP Rituxan - last dose 10/08   Nonspecific elevation of levels of transaminase or lactic acid dehydrogenase (LDH) 07/06/2008   Parkinson's disease 07/27/2014   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/02/2015   Past Surgical History:  Procedure Laterality Date   CHOLECYSTECTOMY, LAPAROSCOPIC     EYE SURGERY     x2 as child   PORT-A-CATH REMOVAL     PORTACATH PLACEMENT     SPLENECTOMY     TONSILLECTOMY     Patient Active Problem List   Diagnosis Date Noted   Mild neurocognitive disorder due to Parkinson's disease 01/10/2022   Gastroesophageal reflux disease 03/07/2020   Localized swelling of right lower extremity 04/12/2019   DOE (dyspnea on exertion) 07/15/2018   10 year risk of MI or stroke 7.5% or greater 04/12/2018   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/02/2015   Meige syndrome (blepharospasm with oromandibular dystonia)  07/27/2014   Dysphagia, neurologic 07/27/2014   Parkinson's disease (HCC) 07/27/2014   Dystonia 07/10/2014   Low back pain 11/21/2011   Nonspecific elevation of levels of transaminase or lactic acid dehydrogenase (LDH) 07/06/2008   Enlarged lymph nodes 07/28/2007   Non-Hodgkin lymphoma 03/23/2007    ONSET DATE: Dx in mid-2010's; script dated 06/04/23  REFERRING DIAG:  G20.A1 (ICD-10-CM) - Parkinson's disease without dyskinesia or fluctuating manifestations (HCC)      THERAPY DIAG:  Dysarthria and anarthria  Cognitive communication deficit  Rationale for Evaluation and Treatment: Rehabilitation  SUBJECTIVE:   SUBJECTIVE STATEMENT: "I found it hard to say what I was thinking, and to be loud." (Pt, re: Sunday school class yesterday) Pt accompanied by: self  PERTINENT HISTORY: Meige syndrome, insomnia, RBD. Pt well known to this SLP - previous ST course d/c'd in April 2024 with pt having met all LTGs.   PAIN:  Are you having pain? No  FALLS: Has patient fallen in last 6 months?  No  PATIENT GOALS: Improve with speech loudness   OBJECTIVE:  Note: Objective measures were completed at Evaluation unless otherwise noted.  DIAGNOSTIC FINDINGS:  From neuropsych evaluation report dated April 2023:  "Mr. Fick completed a comprehensive neuropsychological evaluation on 01/10/2022. Please refer to that encounter for the full report and recommendations. Briefly, results suggested isolated impaired performances across complex attention and semantic fluency. Performance variability was also exhibited across all  aspects of learning and memory. Regarding etiology, the most likely culprit for ongoing cognitive weakness remains his past history of Parkinson's disease. While processing speed was improved relative to what might be expected, dysfunction surrounding complex attention and encoding/retrieval deficits of memory are quite common in this illness. The fact that motor/gait dysfunction was  noted years prior to concern surrounding cognitive decline is also a typical timeline associated with Parkinson's disease. Mild anxiety and sleep dysfunction could further influence cognitive performances. Research surrounding cognitive side effects of pramipexole is mixed; however, I cannot rule out an ongoing side effect contribution as well."  .  PATIENT REPORTED OUTCOME MEASURES (PROM): Communication Effectiveness Survey: pt scored himself 16/32 on 07/22/23, with lower scores indicating greater impact of deficit on speech effectiveness/lower QOL.  TODAY'S TREATMENT:       AB=Abdominal Breathing                                                                                                                                  DATE:  07/29/23: In 18 minutes of conversation pt began 85% utterances with volume average 66dB. SLP targeted loud /a/ to improve muscle memory for incr'd speech loudness and clarity. Pt produced average 91dB with 5 reps. SLP then targeted sentence level responses and pt averaged 85dB. SLP told pt to ensure he performs loud /a/, sentences, and practice louder speech with handouts for 20 minutes BID.  07/27/23: In 6 minutes of conversation pt began 75% utterances with volume average 67dB but had loudness fade by end of utterance. SLP targeted loud /a/ to improve muscle memory for incr'd speech loudness and clarity. Pt produced average 91dB with 7 reps. 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   07/22/23: Pt will need work with AB next session. SLP targeted loud /a/ to improve muscle memory for incr'd speech loudness and clarity. Pt produced average 89dB with 5 reps. SLP told pt to incr reps to 7, BID. Pt thought of replacement everyday sentence to "What inning is it" however this will not be everyday so pt  will look for another to substitute - he would like to keep this one for baseball season. SLP worked with pt on incr'ing his awareness of somatosensory input for WNL speech and he indicated incr in abdominal musculature recruitment. With single word responses pt req'd usual cues for loudness and intent to achieve loudness average 68dB. With phrase responses pt exhibited consistent loudness decay - SLP usual cues for loudness and intent/using abdominal musculature improved loudness across the phrase. Cues for posture and breath support were also helpful.  07/20/23: SLP worked with pt with digital recorder to incr awareness of his softer voice and to re-orient pt to louder, WNL volume speech. SLP was able to assist pt with  reps of loud /a/ and his everyday sentences; SLP verified pt's everyday sentences would all still be pertinent/relevant. Pt decided he needed to change one of them and SLP told him to pay close attention to things he says until next session and possibly find a phrase/sentence to substitute into his list.  PATIENT EDUCATION: Education details: Review eval results, everyday sentence review, and prognosis for recovering speech loudness Person educated: Patient Education method: Explanation Education comprehension: verbalized understanding  HOME EXERCISE PROGRAM: Loud /a/ and everyday sentences, along with speech tasks matching ability level   GOALS: Goals reviewed with patient? Yes  SHORT TERM GOALS: Target date: 08/21/23  Pt will produce loud /a/ with at least low 90s dB average over three sessions  Baseline: 07/27/23 Goal status: INITIAL  2.  Pt will produce 16/20 sentence responses with 70dB over two sessions  Baseline:  Goal status: INITIAL  3.  Pt will produce volume average 69dB in 5 minutes simple conversation with rare min A over three sessions  Baseline:  Goal status: INITIAL  4.  Pt will generate abdominal breathing 80% of the time when engaging in 5 minutes  simple conversation in 2 sessions  Baseline:  Goal status: INITIAL   LONG TERM GOALS: Target date: 09/18/23  Pt will maintain average 69dB over 8 minute simple-mod complex conversation with rare min A in three sessions  Baseline:  Goal status: INITIAL  2.  Pt will remain 100% intelligible out of the speech room for 10 minute conversation, with rare nonverbal cues Baseline:  Goal status: INITIAL  3.  pt will use abdominal breathing 75% of the time in 8 minutes conversation in three sessions  Baseline:  Goal status: INITIAL  4.  Pt will score higher on CES than initial administration  Baseline:  Goal status: INITIAL   ASSESSMENT:  CLINICAL IMPRESSION: Patient is a 72 y.o. male who was seen today for treatment of speech clarity who, on day of eval, demonstrated mod dysarthria and reduced breath support due to Parkinson's disease. See "today's treatment" for more details on today's session. Pt would benefit from skilled ST targeting loudness in conversation. At this time he does not endorse swallowing difficulty but SLP will cont to monitor informally and goal/s added PRN. Memory compensations targeted previous therapy course are reportedly still helpful for pt.  OBJECTIVE IMPAIRMENTS: Objective impairments include attention, memory, and dysarthria. These impairments are limiting patient from ADLs/IADLs and effectively communicating at home and in community.Factors affecting potential to achieve goals and functional outcome are previous level of function and severity of impairments.. Patient will benefit from skilled SLP services to address above impairments and improve overall function.  REHAB POTENTIAL: Good  PLAN:  SLP FREQUENCY: 2x/week  SLP DURATION: 8 weeks  PLANNED INTERVENTIONS: 92507 Treatment of speech (30 or 45 min) , Environmental controls, Cueing hierachy, Internal/external aids, Functional tasks, Multimodal communication approach, SLP instruction and feedback,  Compensatory strategies, and Patient/family education    [facility], CCC-SLP 07/29/2023, 3:11 PM

## 2023-07-29 NOTE — Therapy (Signed)
OUTPATIENT PHYSICAL THERAPY NEURO TREATMENT and D/C Summary   Patient Name: [name] [name] MRN: [id number] DOB:07/31/1951, 72 y.o., male Today's Date: 07/29/2023   PCP: Sharlene Dory, DO REFERRING PROVIDER: Tat, Octaviano Batty, DO  PHYSICAL THERAPY DISCHARGE SUMMARY  Visits from Start of Care: 9  Current functional level related to goals / functional outcomes: Goals met   Remaining deficits: Instances of freezing of gait   Education / Equipment: HEP   Patient agrees to discharge. Patient goals were met. Patient is being discharged due to meeting the stated rehab goals.      END OF SESSION:  PT End of Session - 07/29/23 1407     Visit Number 9    Number of Visits 13    Date for PT Re-Evaluation 08/07/23    Authorization Type Medicare/BCBS-Pt is KX    Progress Note Due on Visit 10    PT Start Time 1405    PT Stop Time 1445    PT Time Calculation (min) 40 min    Equipment Utilized During Treatment Gait belt    Activity Tolerance Patient tolerated treatment well    Behavior During Therapy WFL for tasks assessed/performed              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/27/2014   Mild neurocognitive disorder due to Parkinson's disease 01/10/2022   Non-Hodgkin lymphoma 2007   diffuse- 6 cycles of chemo with R CHOP Rituxan - last dose 10/08   Nonspecific elevation of levels of transaminase or lactic acid dehydrogenase (LDH) 07/06/2008   Parkinson's disease 07/27/2014   Post-splenectomy 04/02/2015   Past Surgical History:  Procedure Laterality Date    CHOLECYSTECTOMY, LAPAROSCOPIC     EYE SURGERY     x2 as child   PORT-A-CATH REMOVAL     PORTACATH PLACEMENT     SPLENECTOMY     TONSILLECTOMY     Patient Active Problem List   Diagnosis Date Noted   Mild neurocognitive disorder due to Parkinson's disease 01/10/2022   Gastroesophageal reflux disease 03/07/2020   Localized swelling of right lower extremity 04/12/2019   DOE (dyspnea on exertion) 07/15/2018   10 year risk of MI or stroke 7.5% or greater 04/12/2018   Post-splenectomy 04/02/2015   Meige syndrome (blepharospasm with oromandibular dystonia) 07/27/2014   Dysphagia, neurologic 07/27/2014   Parkinson's disease (HCC) 07/27/2014   Dystonia 07/10/2014   Low back pain 11/21/2011   Nonspecific elevation of levels of transaminase or lactic acid dehydrogenase (LDH) 07/06/2008   Enlarged lymph nodes 07/28/2007   Non-Hodgkin lymphoma 03/23/2007    ONSET DATE: 06/04/2023 (MD referral)  REFERRING DIAG: G20.A1 (ICD-10-CM) - Parkinson's disease without dyskinesia or fluctuating manifestations (HCC)   THERAPY DIAG:  Unsteadiness on feet  Other abnormalities of gait and mobility  Muscle weakness (generalized)  Parkinson's disease without dyskinesia or fluctuating manifestations (HCC)  Abnormal posture  Other symptoms and signs involving the nervous system  Rationale for Evaluation and Treatment: Rehabilitation  SUBJECTIVE:  SUBJECTIVE STATEMENT: Not doing too good, have had a family emergency Pt accompanied by: self  PERTINENT HISTORY: Hx of Meige syndrome, insomnia, RBD, PD  PAIN:  Are you having pain? No  PRECAUTIONS: Fall  RED FLAGS: None   WEIGHT BEARING RESTRICTIONS: No  FALLS: Has patient fallen in last 6 months? No  LIVING ENVIRONMENT: Lives with: lives with their  family Lives in: House/apartment Stairs: 3 floors, stair lift for w/c for wife; keeps cane on third floor Has following equipment at home: Single point cane and Quad cane large base  PLOF: Independent with household mobility with device  PATIENT GOALS: Goals for therapy are to get back to feeling like I was before I got sick (June/July); just want to move better.   OBJECTIVE:   TODAY'S TREATMENT: 07/29/23 Activity Comments  Treadmill training x 8 min 2 min warm-up at 2.0 mph 30 sec 3.0-3.2 mph, 30 sec 2.3 1 min cool down. HR 103 bpm  TUG test 8.9 sec regular 8.57 sec cognitive  Dynamic Gait Index 23/24  Sit-stand 3x5 reps Two 5# dumbells in front rack  Suitcase march Two 5# dumbbells           PATIENT EDUCATION: Education details: rationale and examples of HIIT as it pertains to PD Person educated: Patient Education method: Explanation Education comprehension: verbalized understanding  HOME EXERCISE PROGRAM: Initiated sit<>stand with hands on knees with cues for eccentric control to sit, 3 x 5 reps, once/day  Access Code: [email or web address] URL: [email or web address] Date: 07/29/2023 Prepared by: Shary Decamp  Exercises - Goblet Squat with Kettlebell  - 1 x daily - 7 x weekly - 3 sets - 10 reps - Kettlebell Suitcase Carry  - 1 x daily - 7 x weekly - 3 sets - 10 reps  Note: Objective measures below were completed at Evaluation unless otherwise noted.  DIAGNOSTIC FINDINGS: NA  COGNITION: Overall cognitive status: Within functional limits for tasks assessed    POSTURE: rounded shoulders, forward head, and posterior pelvic tilt  LOWER EXTREMITY ROM:   AROM WFL in sitting  LOWER EXTREMITY MMT:    MMT Right Eval Left Eval  Hip flexion 4 4  Hip extension    Hip abduction 4 4  Hip adduction 4+ 4+  Hip internal rotation    Hip external rotation    Knee flexion 3+ 3+  Knee extension 4 4  Ankle dorsiflexion 3+ 3+  Ankle plantarflexion    Ankle inversion     Ankle eversion    (Blank rows = not tested)   TRANSFERS: Assistive device utilized: None  Sit to stand: Modified independence Stand to sit: Modified independence  GAIT: Gait pattern: step through pattern, decreased stride length, festinating, trunk flexed, poor foot clearance- Right, and poor foot clearance- Left Distance walked: 60 ft Assistive device utilized: Quad cane large base Level of assistance: Modified independence Comments: Carries cane some during eval  FUNCTIONAL TESTS:  5 times sit to stand: 10.53 sec Timed up and go (TUG): 15.28 sec with decreased eccentric control to sit Dynamic Gait Index: 14/24 TUG cognitive:  15.81 sec  with decreased eccentric control to sit 360 turns to L:  10 steps, 4.44 sec 360 turn to R:  14 steps, 7.12 sec 46M:  10.94 sec = 3 ft/sec        GOALS: Goals reviewed with patient? Yes  SHORT TERM GOALS: Target date: 07/24/2023  Pt will be independent with HEP for improved strength, balance, gait. Baseline: Goal status: MET  2.  Pt will improve TUG score to less than or equal to 13 sec for decreased fall risk. Baseline: 15.28 sec; (07/29/23) 8.9 sec Goal status: MET  3.  Pt will improve DGI score to at least 17/24 to decrease fall risk. Baseline: 14/24 (07/29/23) 23/24 Goal status: MET  LONG TERM GOALS: Target date: 08/07/2023  Pt will be independent with HEP for improved strength, balance, gait. Baseline:  Goal status: IN PROGRESS  2.  Pt will improve TUG cognitive score to less than or equal to 13 sec for decreased fall risk. Baseline: 15.81 sec; (07/29/23) 8.9 sec regular; 8.57 sec cognitive Goal status: MET  3.  Pt will improve DGI score to at least 20/24 to decrease fall risk. Baseline: 14/24; (07/29/23) 23/24 Goal status: MET  4.  Pt will verbalize understanding of ongoing community fitness upon d/c from PT.   Baseline:  Goal status: MET  5.  to 1,280 ft to meet disease-matched norms  Baseline: 950 ft w/  NBQC  Goal status:  NOT TESTED  ASSESSMENT:  CLINICAL IMPRESSION: No new issues. Pt notes overall improvement in function since start of care.  Able to meet STG/LTG except unable to check due to time constraints.  Progressed with motor control and coordination activities and has updated HEP and intends to resume community exercise class for PD.  No further questions or concerns at this time  OBJECTIVE IMPAIRMENTS: Abnormal gait, decreased balance, decreased knowledge of use of DME, decreased mobility, difficulty walking, decreased strength, and postural dysfunction.   ACTIVITY LIMITATIONS: standing, transfers, and locomotion level  PARTICIPATION LIMITATIONS: community activity, church, and community fitness  PERSONAL FACTORS: 3+ comorbidities: PMH  are also affecting patient's functional outcome.   REHAB POTENTIAL: Good  CLINICAL DECISION MAKING: Evolving/moderate complexity  EVALUATION COMPLEXITY: Moderate  PLAN:  PT FREQUENCY: 2x/week  PT DURATION: 6 weeks plus eval  PLANNED INTERVENTIONS: Therapeutic exercises, Therapeutic activity, Neuromuscular re-education, Balance training, Gait training, Patient/Family education, Self Care, Stair training, and DME instructions  PLAN FOR NEXT SESSION:D/C to HEP/PWR moves class    2:07 PM, 07/29/23 M. Shary Decamp, PT, DPT Physical Therapist-  Office Number: [phone]

## 2023-08-03 ENCOUNTER — Ambulatory Visit: Payer: Medicare Other

## 2023-08-03 DIAGNOSIS — R29818 Other symptoms and signs involving the nervous system: Secondary | ICD-10-CM | POA: Diagnosis not present

## 2023-08-03 DIAGNOSIS — R2681 Unsteadiness on feet: Secondary | ICD-10-CM | POA: Diagnosis not present

## 2023-08-03 DIAGNOSIS — M6281 Muscle weakness (generalized): Secondary | ICD-10-CM | POA: Diagnosis not present

## 2023-08-03 DIAGNOSIS — R471 Dysarthria and anarthria: Secondary | ICD-10-CM

## 2023-08-03 DIAGNOSIS — R293 Abnormal posture: Secondary | ICD-10-CM | POA: Diagnosis not present

## 2023-08-03 DIAGNOSIS — R2689 Other abnormalities of gait and mobility: Secondary | ICD-10-CM | POA: Diagnosis not present

## 2023-08-03 DIAGNOSIS — R41841 Cognitive communication deficit: Secondary | ICD-10-CM

## 2023-08-03 DIAGNOSIS — G20A1 Parkinson's disease without dyskinesia, without mention of fluctuations: Secondary | ICD-10-CM | POA: Diagnosis not present

## 2023-08-03 NOTE — Therapy (Signed)
OUTPATIENT SPEECH LANGUAGE PATHOLOGY [facility]   Patient Name: [name] [name] MRN: [id number] DOB:Oct 05, 1950, 72 y.o., male Today's Date: 08/03/2023  PCP: Arva Chafe, MD REFERRING PROVIDER: Kerin Salen, DO  END OF SESSION:  End of Session - 08/03/23 1454     Visit Number 5    Number of Visits 17    Date for SLP Re-Evaluation 09/18/23    SLP Start Time 1407   6 minutes late   SLP Stop Time  1445    SLP Time Calculation (min) 38 min    Activity Tolerance Patient tolerated treatment well               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/27/2014   Mild neurocognitive disorder due to Parkinson's disease 01/10/2022   Non-Hodgkin lymphoma 2007   diffuse- 6 cycles of chemo with R CHOP Rituxan - last dose 10/08   Nonspecific elevation of levels of transaminase or lactic acid dehydrogenase (LDH) 07/06/2008   Parkinson's disease 07/27/2014   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/02/2015   Past Surgical History:  Procedure Laterality Date   CHOLECYSTECTOMY, LAPAROSCOPIC     EYE SURGERY     x2 as child   PORT-A-CATH REMOVAL     PORTACATH PLACEMENT     SPLENECTOMY     TONSILLECTOMY     Patient Active Problem List   Diagnosis Date Noted   Mild neurocognitive disorder due to Parkinson's disease 01/10/2022   Gastroesophageal reflux disease 03/07/2020   Localized swelling of right lower extremity 04/12/2019   DOE (dyspnea on exertion) 07/15/2018   10 year risk of MI or stroke 7.5% or greater 04/12/2018   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/02/2015   Meige syndrome (blepharospasm with  oromandibular dystonia) 07/27/2014   Dysphagia, neurologic 07/27/2014   Parkinson's disease (HCC) 07/27/2014   Dystonia 07/10/2014   Low back pain 11/21/2011   Nonspecific elevation of levels of transaminase or lactic acid dehydrogenase (LDH) 07/06/2008   Enlarged lymph nodes 07/28/2007   Non-Hodgkin lymphoma 03/23/2007    ONSET DATE: Dx in mid-2010's; script dated 06/04/23  REFERRING DIAG:  G20.A1 (ICD-10-CM) - Parkinson's disease without dyskinesia or fluctuating manifestations (HCC)      THERAPY DIAG:  Dysarthria and anarthria  Cognitive communication deficit  Rationale for Evaluation and Treatment: Rehabilitation  SUBJECTIVE:   SUBJECTIVE STATEMENT: "I found it hard to say what I was thinking, and to be loud." (Pt, re: Sunday school class yesterday) Pt accompanied by: self  PERTINENT HISTORY: Meige syndrome, insomnia, RBD. Pt well known to this SLP - previous ST course d/c'd in April 2024 with pt having met all LTGs.   PAIN:  Are you having pain? No  FALLS: Has patient fallen in last 6 months?  No  PATIENT GOALS: Improve with speech loudness   OBJECTIVE:  Note: Objective measures were completed at Evaluation unless otherwise noted.  DIAGNOSTIC FINDINGS:  From neuropsych evaluation report dated April 2023:  "Mr. Geiling completed a comprehensive neuropsychological evaluation on 01/10/2022. Please refer to that encounter for the full report and recommendations. Briefly, results suggested isolated impaired performances across complex attention and semantic fluency. Performance variability  was also exhibited across all aspects of learning and memory. Regarding etiology, the most likely culprit for ongoing cognitive weakness remains his past history of Parkinson's disease. While processing speed was improved relative to what might be expected, dysfunction surrounding complex attention and encoding/retrieval deficits of memory are quite common in this illness. The fact that  motor/gait dysfunction was noted years prior to concern surrounding cognitive decline is also a typical timeline associated with Parkinson's disease. Mild anxiety and sleep dysfunction could further influence cognitive performances. Research surrounding cognitive side effects of pramipexole is mixed; however, I cannot rule out an ongoing side effect contribution as well."  .  PATIENT REPORTED OUTCOME MEASURES (PROM): Communication Effectiveness Survey: pt scored himself 16/32 on 07/22/23, with lower scores indicating greater impact of deficit on speech effectiveness/lower QOL.  TODAY'S TREATMENT:       AB=Abdominal Breathing                                                                                                                                  DATE:  08/03/23: HEP has been more difficult since last session due to family matters (death in the family). SLP targeted loud /a/ to improve muscle memory for incr'd speech loudness and clarity. Pt produced average 88 dB with 5 reps. SLP then targeted sentence level responses and pt averaged 79dB - this improved to 83dB when asked to repeat. Spelling words in a category pt averaged 69dB. Telling 3 items/things in sequence (precede or follow) average loudnesse 66dB. SLP targeted loud /a/ to improve muscle memory for incr'd speech lou of 68dB with extra time, given cognitive component. SLP gave pt homework for sentence level responses. AB noted in 70% of pt's sentence level responses.  07/29/23: In 18 minutes of conversation pt began 85% utterances with volume average 66dB. SLP targeted loud /a/ to improve muscle memory for incr'd speech loudness and clarity.  Pt produced average 91dB with 5 reps. SLP then targeted sentence level responses and pt averaged 85dB. SLP told pt to ensure he performs loud /a/, sentences, and practice louder speech with handouts for 20 minutes BID.  07/27/23: In 6 minutes of conversation pt began 75% utterances with volume average  67dB but had loudness fade by end of utterance. SLP targeted loud /a/ to improve muscle memory for incr'd speech loudness and clarity. Pt produced average 91dB with 7 reps. 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   07/22/23: Pt will need work with AB next session. SLP targeted loud /a/ to improve muscle memory for incr'd speech loudness and clarity. Pt produced average 89dB with 5 reps. SLP told pt to incr reps to 7, BID. Pt thought of replacement everyday sentence to "What inning is it" however this will not be everyday so  pt will look for another to substitute - he would like to keep this one for baseball season. SLP worked with pt on incr'ing his awareness of somatosensory input for WNL speech and he indicated incr in abdominal musculature recruitment. With single word responses pt req'd usual cues for loudness and intent to achieve loudness average 68dB. With phrase responses pt exhibited consistent loudness decay - SLP usual cues for loudness and intent/using abdominal musculature improved loudness across the phrase. Cues for posture and breath support were also helpful.  07/20/23: SLP worked with pt with digital recorder to incr awareness of his softer voice and to re-orient pt to louder, WNL volume speech. SLP was able to assist pt with reps of loud /a/ and his everyday sentences; SLP verified pt's everyday sentences would all still be pertinent/relevant. Pt decided he needed to change one of them and SLP told him to pay close attention to things he says until next session and possibly find a phrase/sentence to substitute into his list.  PATIENT EDUCATION: Education details: See "today's treatment" for details Person educated: Patient Education method: Explanation Education comprehension: verbalized understanding  HOME  EXERCISE PROGRAM: Loud /a/ and everyday sentences, along with speech tasks matching ability level   GOALS: Goals reviewed with patient? Yes  SHORT TERM GOALS: Target date: 08/21/23  Pt will produce loud /a/ with at least low 90s dB average over three sessions  Baseline: 07/27/23 Goal status: INITIAL  2.  Pt will produce 16/20 sentence responses with 70dB over two sessions  Baseline:  Goal status: INITIAL  3.  Pt will produce volume average 69dB in 5 minutes simple conversation with rare min A over three sessions  Baseline:  Goal status: INITIAL  4.  Pt will generate abdominal breathing 80% of the time when engaging in 5 minutes simple conversation in 2 sessions  Baseline:  Goal status: INITIAL   LONG TERM GOALS: Target date: 09/18/23  Pt will maintain average 69dB over 8 minute simple-mod complex conversation with rare min A in three sessions  Baseline:  Goal status: INITIAL  2.  Pt will remain 100% intelligible out of the speech room for 10 minute conversation, with rare nonverbal cues Baseline:  Goal status: INITIAL  3.  pt will use abdominal breathing 75% of the time in 8 minutes conversation in three sessions  Baseline:  Goal status: INITIAL  4.  Pt will score higher on CES than initial administration  Baseline:  Goal status: INITIAL   ASSESSMENT:  CLINICAL IMPRESSION: Patient is a 72 y.o. male who was seen today for treatment of speech clarity who, on day of eval, demonstrated mod dysarthria and reduced breath support due to Parkinson's disease. See "today's treatment" for more details on today's session. Pt would benefit from skilled ST targeting loudness in conversation. At this time he does not endorse swallowing difficulty but SLP will cont to monitor informally and goal/s added PRN. Memory compensations targeted previous therapy course are reportedly still helpful for pt.  OBJECTIVE IMPAIRMENTS: Objective impairments include attention, memory, and  dysarthria. These impairments are limiting patient from ADLs/IADLs and effectively communicating at home and in community.Factors affecting potential to achieve goals and functional outcome are previous level of function and severity of impairments.. Patient will benefit from skilled SLP services to address above impairments and improve overall function.  REHAB POTENTIAL: Good  PLAN:  SLP FREQUENCY: 2x/week  SLP DURATION: 8 weeks  PLANNED INTERVENTIONS: 92507 Treatment of speech (30 or 45 min) , Environmental controls, Cueing  hierachy, Internal/external aids, Functional tasks, Multimodal communication approach, SLP instruction and feedback, Compensatory strategies, and Patient/family education    [facility], CCC-SLP 08/03/2023, 2:54 PM

## 2023-08-05 ENCOUNTER — Ambulatory Visit: Payer: Medicare Other

## 2023-08-05 DIAGNOSIS — R29818 Other symptoms and signs involving the nervous system: Secondary | ICD-10-CM | POA: Diagnosis not present

## 2023-08-05 DIAGNOSIS — R293 Abnormal posture: Secondary | ICD-10-CM | POA: Diagnosis not present

## 2023-08-05 DIAGNOSIS — R2689 Other abnormalities of gait and mobility: Secondary | ICD-10-CM | POA: Diagnosis not present

## 2023-08-05 DIAGNOSIS — G20A1 Parkinson's disease without dyskinesia, without mention of fluctuations: Secondary | ICD-10-CM | POA: Diagnosis not present

## 2023-08-05 DIAGNOSIS — R41841 Cognitive communication deficit: Secondary | ICD-10-CM

## 2023-08-05 DIAGNOSIS — M6281 Muscle weakness (generalized): Secondary | ICD-10-CM | POA: Diagnosis not present

## 2023-08-05 DIAGNOSIS — R2681 Unsteadiness on feet: Secondary | ICD-10-CM | POA: Diagnosis not present

## 2023-08-05 DIAGNOSIS — R471 Dysarthria and anarthria: Secondary | ICD-10-CM

## 2023-08-05 NOTE — Therapy (Signed)
OUTPATIENT SPEECH LANGUAGE PATHOLOGY [facility]   Patient Name: [name] [name] MRN: [id number] DOB:[birth year], 72 y.o., male Today's Date: 08/05/2023  PCP: Arva Chafe, MD REFERRING PROVIDER: Kerin Salen, DO  END OF SESSION:  End of Session - 08/05/23 1431     Visit Number 6    Number of Visits 17    Date for SLP Re-Evaluation 09/18/23    SLP Start Time 1405    SLP Stop Time  1445    SLP Time Calculation (min) 40 min    Activity Tolerance Patient tolerated treatment well               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/27/2014   Mild neurocognitive disorder due to Parkinson's disease 01/10/2022   Non-Hodgkin lymphoma 2007   diffuse- 6 cycles of chemo with R CHOP Rituxan - last dose 10/08   Nonspecific elevation of levels of transaminase or lactic acid dehydrogenase (LDH) 07/06/2008   Parkinson's disease 07/27/2014   Post-splenectomy 04/02/2015   Past Surgical History:  Procedure Laterality Date   CHOLECYSTECTOMY, LAPAROSCOPIC     EYE SURGERY     x2 as child   PORT-A-CATH REMOVAL     PORTACATH PLACEMENT     SPLENECTOMY     TONSILLECTOMY     Patient Active Problem List   Diagnosis Date Noted   Mild neurocognitive disorder due to Parkinson's disease 01/10/2022   Gastroesophageal reflux disease 03/07/2020   Localized swelling of right lower extremity 04/12/2019   DOE (dyspnea on exertion) 07/15/2018   10 year risk of MI or stroke 7.5% or greater 04/12/2018   Post-splenectomy 04/02/2015   Meige syndrome (blepharospasm with oromandibular  dystonia) 07/27/2014   Dysphagia, neurologic 07/27/2014   Parkinson's disease (HCC) 07/27/2014   Dystonia 07/10/2014   Low back pain 11/21/2011   Nonspecific elevation of levels of transaminase or lactic acid dehydrogenase (LDH) 07/06/2008   Enlarged lymph nodes 07/28/2007   Non-Hodgkin lymphoma 03/23/2007    ONSET DATE: Dx in mid-2010's; script dated 06/04/23  REFERRING DIAG:  G20.A1 (ICD-10-CM) - Parkinson's disease without dyskinesia or fluctuating manifestations (HCC)      THERAPY DIAG:  Dysarthria and anarthria  Cognitive communication deficit  Rationale for Evaluation and Treatment: Rehabilitation  SUBJECTIVE:   SUBJECTIVE STATEMENT: "I found it hard to say what I was thinking, and to be loud." (Pt, re: Sunday school class yesterday) Pt accompanied by: self  PERTINENT HISTORY: Meige syndrome, insomnia, RBD. Pt well known to this SLP - previous ST course d/c'd in April 2024 with pt having met all LTGs.   PAIN:  Are you having pain? No  FALLS: Has patient fallen in last 6 months?  No  PATIENT GOALS: Improve with speech loudness   OBJECTIVE:  Note: Objective measures were completed at Evaluation unless otherwise noted.  DIAGNOSTIC FINDINGS:  From neuropsych evaluation report dated April 2023:  "Mr. Buenafe completed a comprehensive neuropsychological evaluation on 01/10/2022. Please refer to that encounter for the full report and recommendations. Briefly, results suggested isolated impaired performances across complex attention and semantic fluency. Performance variability was also exhibited across  all aspects of learning and memory. Regarding etiology, the most likely culprit for ongoing cognitive weakness remains his past history of Parkinson's disease. While processing speed was improved relative to what might be expected, dysfunction surrounding complex attention and encoding/retrieval deficits of memory are quite common in this illness. The fact that motor/gait  dysfunction was noted years prior to concern surrounding cognitive decline is also a typical timeline associated with Parkinson's disease. Mild anxiety and sleep dysfunction could further influence cognitive performances. Research surrounding cognitive side effects of pramipexole is mixed; however, I cannot rule out an ongoing side effect contribution as well."  .  PATIENT REPORTED OUTCOME MEASURES (PROM): Communication Effectiveness Survey: pt scored himself 16/32 on 07/22/23, with lower scores indicating greater impact of deficit on speech effectiveness/lower QOL.  TODAY'S TREATMENT:       AB=Abdominal Breathing                                                                                                                                  DATE:  08/05/23: SLP targeted loud /a/ to improve muscle memory for incr'd speech loudness and clarity. Pt produced average 90 dB with 7 reps. SLP then targeted sentence level responses and pt averaged 82dB - cues once for breathing like with loud /a/. Pt notably takes adequate breath with loud /a/ and only approx 2/3 as much when talking.    08/03/23: HEP has been more difficult since last session due to family matters (death in the family). SLP targeted loud /a/ to improve muscle memory for incr'd speech loudness and clarity. Pt produced average 88 dB with 5 reps. SLP then targeted sentence level responses and pt averaged 79dB - this improved to 83dB when asked to repeat. Spelling words in a category pt averaged 69dB. Telling 3 items/things in sequence (precede or follow) average loudnesse 66dB. SLP targeted loud /a/ to improve muscle memory for incr'd speech lou of 68dB with extra time, given cognitive component. SLP gave pt homework for sentence level responses. AB noted in 70% of pt's sentence level responses.  07/29/23: In 18 minutes of conversation pt began 85% utterances with volume average 66dB. SLP targeted loud /a/ to improve muscle memory for incr'd speech  loudness and clarity.  Pt produced average 91dB with 5 reps. SLP then targeted sentence level responses and pt averaged 85dB. SLP told pt to ensure he performs loud /a/, sentences, and practice louder speech with handouts for 20 minutes BID.  07/27/23: In 6 minutes of conversation pt began 75% utterances with volume average 67dB but had loudness fade by end of utterance. SLP targeted loud /a/ to improve muscle memory for incr'd speech loudness and clarity. Pt produced average 91dB with 7 reps. 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  07/22/23: Pt will need work with AB next session. SLP targeted loud /a/ to improve muscle memory for incr'd speech loudness and clarity. Pt produced average 89dB with 5 reps. SLP told pt to incr reps to 7, BID. Pt thought of replacement everyday sentence to "What inning is it" however this will not be everyday so pt will look for another to substitute - he would like to keep this one for baseball season. SLP worked with pt on incr'ing his awareness of somatosensory input for WNL speech and he indicated incr in abdominal musculature recruitment. With single word responses pt req'd usual cues for loudness and intent to achieve loudness average 68dB. With phrase responses pt exhibited consistent loudness decay - SLP usual cues for loudness and intent/using abdominal musculature improved loudness across the phrase. Cues for posture and breath support were also helpful.  07/20/23: SLP worked with pt with digital recorder to incr awareness of his softer voice and to re-orient pt to louder, WNL volume speech. SLP was able to assist pt with reps of loud /a/ and his everyday sentences; SLP verified pt's everyday sentences would all still be pertinent/relevant. Pt decided he needed to change one of them and SLP  told him to pay close attention to things he says until next session and possibly find a phrase/sentence to substitute into his list.  PATIENT EDUCATION: Education details: See "today's treatment" for details Person educated: Patient Education method: Explanation Education comprehension: verbalized understanding  HOME EXERCISE PROGRAM: Loud /a/ and everyday sentences, along with speech tasks matching ability level   GOALS: Goals reviewed with patient? Yes  SHORT TERM GOALS: Target date: 08/21/23  Pt will produce loud /a/ with at least low 90s dB average over three sessions  Baseline: 07/27/23 Goal status: INITIAL  2.  Pt will produce 16/20 sentence responses with 70dB over two sessions  Baseline:  Goal status: INITIAL  3.  Pt will produce volume average 69dB in 5 minutes simple conversation with rare min A over three sessions  Baseline:  Goal status: INITIAL  4.  Pt will generate abdominal breathing 80% of the time when engaging in 5 minutes simple conversation in 2 sessions  Baseline:  Goal status: INITIAL   LONG TERM GOALS: Target date: 09/18/23  Pt will maintain average 69dB over 8 minute simple-mod complex conversation with rare min A in three sessions  Baseline:  Goal status: INITIAL  2.  Pt will remain 100% intelligible out of the speech room for 10 minute conversation, with rare nonverbal cues Baseline:  Goal status: INITIAL  3.  pt will use abdominal breathing 75% of the time in 8 minutes conversation in three sessions  Baseline:  Goal status: INITIAL  4.  Pt will score higher on CES than initial administration  Baseline:  Goal status: INITIAL   ASSESSMENT:  CLINICAL IMPRESSION: Patient is a 72 y.o. male who was seen today for treatment of speech clarity who, on day of eval, demonstrated mod dysarthria and reduced breath support due to Parkinson's disease. See "today's treatment" for more details on today's session. Pt would benefit from skilled ST  targeting loudness in conversation. At this time he does not endorse swallowing difficulty but SLP will cont to monitor informally and goal/s added PRN. Memory compensations targeted previous therapy course are reportedly still helpful for pt.  OBJECTIVE IMPAIRMENTS: Objective impairments include attention, memory, and dysarthria. These impairments are limiting patient from ADLs/IADLs and effectively communicating at home and in community.Factors affecting potential to achieve goals  and functional outcome are previous level of function and severity of impairments.. Patient will benefit from skilled SLP services to address above impairments and improve overall function.  REHAB POTENTIAL: Good  PLAN:  SLP FREQUENCY: 2x/week  SLP DURATION: 8 weeks  PLANNED INTERVENTIONS: 92507 Treatment of speech (30 or 45 min) , Environmental controls, Cueing hierachy, Internal/external aids, Functional tasks, Multimodal communication approach, SLP instruction and feedback, Compensatory strategies, and Patient/family education    [facility], CCC-SLP 08/05/2023, 2:32 PM

## 2023-08-10 ENCOUNTER — Ambulatory Visit: Payer: Medicare Other

## 2023-08-10 DIAGNOSIS — R2681 Unsteadiness on feet: Secondary | ICD-10-CM | POA: Diagnosis not present

## 2023-08-10 DIAGNOSIS — R29818 Other symptoms and signs involving the nervous system: Secondary | ICD-10-CM | POA: Diagnosis not present

## 2023-08-10 DIAGNOSIS — R41841 Cognitive communication deficit: Secondary | ICD-10-CM

## 2023-08-10 DIAGNOSIS — R2689 Other abnormalities of gait and mobility: Secondary | ICD-10-CM | POA: Diagnosis not present

## 2023-08-10 DIAGNOSIS — R293 Abnormal posture: Secondary | ICD-10-CM | POA: Diagnosis not present

## 2023-08-10 DIAGNOSIS — R471 Dysarthria and anarthria: Secondary | ICD-10-CM

## 2023-08-10 DIAGNOSIS — M6281 Muscle weakness (generalized): Secondary | ICD-10-CM | POA: Diagnosis not present

## 2023-08-10 DIAGNOSIS — G20A1 Parkinson's disease without dyskinesia, without mention of fluctuations: Secondary | ICD-10-CM | POA: Diagnosis not present

## 2023-08-10 NOTE — Therapy (Signed)
OUTPATIENT SPEECH LANGUAGE PATHOLOGY [facility]   Patient Name: [name] [name] MRN: [id number] DOB:1951/01/19, 72 y.o., male Today's Date: 08/10/2023  PCP: Arva Chafe, MD REFERRING PROVIDER: Kerin Salen, DO  END OF SESSION:  End of Session - 08/10/23 1413     Visit Number 7    Number of Visits 17    Date for SLP Re-Evaluation 09/18/23    SLP Start Time 1406    SLP Stop Time  1445    SLP Time Calculation (min) 39 min    Activity Tolerance Patient tolerated treatment well               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/27/2014   Mild neurocognitive disorder due to Parkinson's disease 01/10/2022   Non-Hodgkin lymphoma 2007   diffuse- 6 cycles of chemo with R CHOP Rituxan - last dose 10/08   Nonspecific elevation of levels of transaminase or lactic acid dehydrogenase (LDH) 07/06/2008   Parkinson's disease 07/27/2014   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   Past Surgical History:  Procedure Laterality Date   CHOLECYSTECTOMY, LAPAROSCOPIC     EYE SURGERY     x2 as child   PORT-A-CATH REMOVAL     PORTACATH PLACEMENT     SPLENECTOMY     TONSILLECTOMY     Patient Active Problem List   Diagnosis Date Noted   Mild neurocognitive disorder due to Parkinson's disease 01/10/2022   Gastroesophageal reflux disease 03/07/2020   Localized swelling of right lower extremity 04/12/2019   DOE (dyspnea on exertion) 07/15/2018   10 year risk of MI or stroke 7.5% or greater 04/12/2018   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/2015   Meige syndrome (blepharospasm with oromandibular  dystonia) 07/27/2014   Dysphagia, neurologic 07/27/2014   Parkinson's disease (HCC) 07/27/2014   Dystonia 07/10/2014   Low back pain 11/21/2011   Nonspecific elevation of levels of transaminase or lactic acid dehydrogenase (LDH) 07/06/2008   Enlarged lymph nodes 07/28/2007   Non-Hodgkin lymphoma 03/23/2007    ONSET DATE: Dx in mid-2010's; script dated 06/04/23  REFERRING DIAG:  G20.A1 (ICD-10-CM) - Parkinson's disease without dyskinesia or fluctuating manifestations (HCC)      THERAPY DIAG:  Dysarthria and anarthria  Cognitive communication deficit  Rationale for Evaluation and Treatment: Rehabilitation  SUBJECTIVE:   SUBJECTIVE STATEMENT: "I didn't practice that well but Sunday to church I was working hard (making up for practicing the other days)." Pt and wife had unexpected company due to funeral.   Pt accompanied by: self  PERTINENT HISTORY: Meige syndrome, insomnia, RBD. Pt well known to this SLP - previous ST course d/c'd in April 2024 with pt having met all LTGs.   PAIN:  Are you having pain? No  FALLS: Has patient fallen in last 6 months?  No  PATIENT GOALS: Improve with speech loudness   OBJECTIVE:  Note: Objective measures were completed at Evaluation unless otherwise noted.  DIAGNOSTIC FINDINGS:  From neuropsych evaluation report dated April 2023:  "Mr. Senn completed a comprehensive neuropsychological evaluation on 01/10/2022. Please refer to that encounter for the full report and recommendations. Briefly, results suggested isolated impaired performances across  complex attention and semantic fluency. Performance variability was also exhibited across all aspects of learning and memory. Regarding etiology, the most likely culprit for ongoing cognitive weakness remains his past history of Parkinson's disease. While processing speed was improved relative to what might be expected, dysfunction surrounding complex attention and encoding/retrieval deficits of memory  are quite common in this illness. The fact that motor/gait dysfunction was noted years prior to concern surrounding cognitive decline is also a typical timeline associated with Parkinson's disease. Mild anxiety and sleep dysfunction could further influence cognitive performances. Research surrounding cognitive side effects of pramipexole is mixed; however, I cannot rule out an ongoing side effect contribution as well."  .  PATIENT REPORTED OUTCOME MEASURES (PROM): Communication Effectiveness Survey: pt scored himself 16/32 on 07/22/23, with lower scores indicating greater impact of deficit on speech effectiveness/lower QOL.  TODAY'S TREATMENT:       AB=Abdominal Breathing                                                                                                                                  DATE:  08/10/23: 10 minutes of conversation prior to loud /a/ was average 64dB. Pt would improve to WNL loudness but only for approx 15 seconds and then spoke below WNL until the next time he realized he was lower than WNL. Loud /a/ targeted to improve muscle memory for improved loudness of speech in conversation was average 88 dB. Sentences were produced with average 81dB. SLP targeted sentence - level material for responses as pt was not performing as well as previous session. Pt's average volume today with these responses was 66dB with more difficulty with multi-tasking (loudness and content) than previous sessions.SLP strongly encouraged pt to practice even if it is more difficult to find some alone setting/time in his house, based upon pt's performance today.   08/05/23: SLP targeted loud /a/ to improve muscle memory for incr'd speech loudness and clarity. Pt produced average 90 dB with 7 reps. SLP then targeted sentence level responses and pt averaged 82dB - cues once for breathing like with loud /a/. Pt notably takes adequate breath with loud /a/ and only approx 2/3 as much when talking.    08/03/23:  HEP has been more difficult since last session due to family matters (death in the family). SLP targeted loud /a/ to improve muscle memory for incr'd speech loudness and clarity. Pt produced average 88 dB with 5 reps. SLP then targeted sentence level responses and pt averaged 79dB - this improved to 83dB when asked to repeat. Spelling words in a category pt averaged 69dB. Telling 3 items/things in sequence (precede or follow) average loudnesse 66dB. SLP targeted loud /a/ to improve muscle memory for incr'd speech lou of 68dB with extra time, given cognitive component. SLP gave pt homework for sentence level responses. AB noted in 70% of pt's sentence level responses.  07/29/23: In 18 minutes of conversation pt began 85%  utterances with volume average 66dB. SLP targeted loud /a/ to improve muscle memory for incr'd speech loudness and clarity.  Pt produced average 91dB with 5 reps. SLP then targeted sentence level responses and pt averaged 85dB. SLP told pt to ensure he performs loud /a/, sentences, and practice louder speech with handouts for 20 minutes BID.  07/27/23: In 6 minutes of conversation pt began 75% utterances with volume average 67dB but had loudness fade by end of utterance. SLP targeted loud /a/ to improve muscle memory for incr'd speech loudness and clarity. Pt produced average 91dB with 7 reps. 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   07/22/23: Pt will need work with AB next session. SLP targeted loud /a/ to improve muscle memory for incr'd speech loudness and clarity. Pt produced average 89dB with 5 reps. SLP told pt to incr reps to 7, BID. Pt thought of replacement everyday sentence to "What inning is it" however this will not be everyday so pt will look for another to substitute - he would like to keep this one for  baseball season. SLP worked with pt on incr'ing his awareness of somatosensory input for WNL speech and he indicated incr in abdominal musculature recruitment. With single word responses pt req'd usual cues for loudness and intent to achieve loudness average 68dB. With phrase responses pt exhibited consistent loudness decay - SLP usual cues for loudness and intent/using abdominal musculature improved loudness across the phrase. Cues for posture and breath support were also helpful.  07/20/23: SLP worked with pt with digital recorder to incr awareness of his softer voice and to re-orient pt to louder, WNL volume speech. SLP was able to assist pt with reps of loud /a/ and his everyday sentences; SLP verified pt's everyday sentences would all still be pertinent/relevant. Pt decided he needed to change one of them and SLP told him to pay close attention to things he says until next session and possibly find a phrase/sentence to substitute into his list.  PATIENT EDUCATION: Education details: See "today's treatment" for details Person educated: Patient Education method: Explanation Education comprehension: verbalized understanding  HOME EXERCISE PROGRAM: Loud /a/ and everyday sentences, along with speech tasks matching ability level   GOALS: Goals reviewed with patient? Yes  SHORT TERM GOALS: Target date: 08/21/23  Pt will produce loud /a/ with at least low 90s dB average over three sessions  Baseline: 07/27/23 Goal status: INITIAL  2.  Pt will produce 16/20 sentence responses with 69dB over two sessions  Baseline:  Goal status: MODIFIED  3.  Pt will produce volume average 68dB in 5 minutes simple conversation with rare min A over three sessions  Baseline:  Goal status: MODIFIED  4.  Pt will generate abdominal breathing 80% of the time when engaging in 5 minutes simple conversation in 2 sessions  Baseline:  Goal status: INITIAL   LONG TERM GOALS: Target date: 09/18/23  Pt will  maintain average 69dB over 8 minute simple-mod complex conversation with rare min A in three sessions  Baseline:  Goal status: INITIAL  2.  Pt will remain 100% intelligible out of the speech room for 10 minute conversation, with rare nonverbal cues Baseline:  Goal status: INITIAL  3.  pt will use abdominal breathing 75% of the time in 8 minutes conversation in three sessions  Baseline:  Goal status: INITIAL  4.  Pt will score higher on CES than initial administration  Baseline:  Goal status: INITIAL   ASSESSMENT:  CLINICAL IMPRESSION: Patient is a 72 y.o. male who was seen today for treatment of speech clarity who, on day of eval, demonstrated mod dysarthria and reduced breath support due to Parkinson's disease. See "today's treatment" for more details on today's session. Pt would benefit from cont'd skilled ST targeting loudness in conversation. At this time he does not endorse swallowing difficulty but SLP will cont to monitor informally and goal/s added PRN. Memory compensations targeted previous therapy course are reportedly still helpful for pt.  OBJECTIVE IMPAIRMENTS: Objective impairments include attention, memory, and dysarthria. These impairments are limiting patient from ADLs/IADLs and effectively communicating at home and in community.Factors affecting potential to achieve goals and functional outcome are previous level of function and severity of impairments.. Patient will benefit from skilled SLP services to address above impairments and improve overall function.  REHAB POTENTIAL: Good  PLAN:  SLP FREQUENCY: 2x/week  SLP DURATION: 8 weeks  PLANNED INTERVENTIONS: 92507 Treatment of speech (30 or 45 min) , Environmental controls, Cueing hierachy, Internal/external aids, Functional tasks, Multimodal communication approach, SLP instruction and feedback, Compensatory strategies, and Patient/family education    [facility], CCC-SLP 08/10/2023, 2:16 PM

## 2023-08-12 ENCOUNTER — Ambulatory Visit: Payer: Medicare Other

## 2023-08-12 DIAGNOSIS — R41841 Cognitive communication deficit: Secondary | ICD-10-CM

## 2023-08-12 DIAGNOSIS — R2689 Other abnormalities of gait and mobility: Secondary | ICD-10-CM | POA: Diagnosis not present

## 2023-08-12 DIAGNOSIS — R2681 Unsteadiness on feet: Secondary | ICD-10-CM | POA: Diagnosis not present

## 2023-08-12 DIAGNOSIS — G20A1 Parkinson's disease without dyskinesia, without mention of fluctuations: Secondary | ICD-10-CM | POA: Diagnosis not present

## 2023-08-12 DIAGNOSIS — M6281 Muscle weakness (generalized): Secondary | ICD-10-CM | POA: Diagnosis not present

## 2023-08-12 DIAGNOSIS — R471 Dysarthria and anarthria: Secondary | ICD-10-CM

## 2023-08-12 DIAGNOSIS — R293 Abnormal posture: Secondary | ICD-10-CM | POA: Diagnosis not present

## 2023-08-12 DIAGNOSIS — R29818 Other symptoms and signs involving the nervous system: Secondary | ICD-10-CM | POA: Diagnosis not present

## 2023-08-12 NOTE — Therapy (Signed)
OUTPATIENT SPEECH LANGUAGE PATHOLOGY [facility]   Patient Name: [name] [name] MRN: [id number] DOB:03/13/1951, 72 y.o., male Today's Date: 08/12/2023  PCP: Arva Chafe, MD REFERRING PROVIDER: Kerin Salen, DO  END OF SESSION:  End of Session - 08/12/23 1406     Visit Number 8    Number of Visits 17    Date for SLP Re-Evaluation 09/18/23    SLP Start Time 1406    SLP Stop Time  1445    SLP Time Calculation (min) 39 min    Activity Tolerance Patient tolerated treatment well               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/27/2014   Mild neurocognitive disorder due to Parkinson's disease 01/10/2022   Non-Hodgkin lymphoma 2007   diffuse- 6 cycles of chemo with R CHOP Rituxan - last dose 10/08   Nonspecific elevation of levels of transaminase or lactic acid dehydrogenase (LDH) 07/06/2008   Parkinson's disease 07/27/2014   Post-splenectomy 04/02/2015   Past Surgical History:  Procedure Laterality Date   CHOLECYSTECTOMY, LAPAROSCOPIC     EYE SURGERY     x2 as child   PORT-A-CATH REMOVAL     PORTACATH PLACEMENT     SPLENECTOMY     TONSILLECTOMY     Patient Active Problem List   Diagnosis Date Noted   Mild neurocognitive disorder due to Parkinson's disease 01/10/2022   Gastroesophageal reflux disease 03/07/2020   Localized swelling of right lower extremity 04/12/2019   DOE (dyspnea on exertion) 07/15/2018   10 year risk of MI or stroke 7.5% or greater 04/12/2018   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   Meige syndrome (blepharospasm with oromandibular  dystonia) 07/27/2014   Dysphagia, neurologic 07/27/2014   Parkinson's disease (HCC) 07/27/2014   Dystonia 07/10/2014   Low back pain 11/21/2011   Nonspecific elevation of levels of transaminase or lactic acid dehydrogenase (LDH) 07/06/2008   Enlarged lymph nodes 07/28/2007   Non-Hodgkin lymphoma 03/23/2007    ONSET DATE: Dx in mid-2010's; script dated 06/04/23  REFERRING DIAG:  G20.A1 (ICD-10-CM) - Parkinson's disease without dyskinesia or fluctuating manifestations (HCC)      THERAPY DIAG:  Dysarthria and anarthria  Cognitive communication deficit  Rationale for Evaluation and Treatment: Rehabilitation  SUBJECTIVE:   SUBJECTIVE STATEMENT: "I'm trailing off at the end."   Pt accompanied by: self  PERTINENT HISTORY: Meige syndrome, insomnia, RBD. Pt well known to this SLP - previous ST course d/c'd in April 2024 with pt having met all LTGs.   PAIN:  Are you having pain? No  FALLS: Has patient fallen in last 6 months?  No  PATIENT GOALS: Improve with speech loudness   OBJECTIVE:  Note: Objective measures were completed at Evaluation unless otherwise noted.  DIAGNOSTIC FINDINGS:  From neuropsych evaluation report dated April 2023:  "Mr. Holland completed a comprehensive neuropsychological evaluation on 01/10/2022. Please refer to that encounter for the full report and recommendations. Briefly, results suggested isolated impaired performances across complex attention and semantic fluency. Performance variability was also exhibited across all aspects of learning and memory. Regarding etiology, the most likely culprit  for ongoing cognitive weakness remains his past history of Parkinson's disease. While processing speed was improved relative to what might be expected, dysfunction surrounding complex attention and encoding/retrieval deficits of memory are quite common in this illness. The fact that motor/gait dysfunction was noted years prior to concern surrounding cognitive  decline is also a typical timeline associated with Parkinson's disease. Mild anxiety and sleep dysfunction could further influence cognitive performances. Research surrounding cognitive side effects of pramipexole is mixed; however, I cannot rule out an ongoing side effect contribution as well."  .  PATIENT REPORTED OUTCOME MEASURES (PROM): Communication Effectiveness Survey: pt scored himself 16/32 on 07/22/23, with lower scores indicating greater impact of deficit on speech effectiveness/lower QOL.  TODAY'S TREATMENT:       AB=Abdominal Breathing                                                                                                                                  DATE:  08/12/23: Loud /a/ targeted to improve muscle memory for improved loudness of speech in conversation was average 91dB. SLP ascertained pt practiced twice yesterday, and once already today. Suspect this is one reason for pt's improved average. Sentences were produced with average 81dB. 2-sentence responses were average 66dB. Pt with much vocal "arresting" today with strained voicing. SLP told pt to cont practicing.   08/10/23: 10 minutes of conversation prior to loud /a/ was average 64dB. Pt would improve to WNL loudness but only for approx 15 seconds and then spoke below WNL until the next time he realized he was lower than WNL. Loud /a/ targeted to improve muscle memory for improved loudness of speech in conversation was average 88 dB. Sentences were produced with average 81dB. SLP targeted sentence - level material for responses as pt was not performing as well as previous session. Pt's average volume today with these responses was 66dB with more difficulty with multi-tasking (loudness and content) than previous sessions.SLP strongly encouraged pt to practice even if it is more difficult to find some alone setting/time in his house, based upon pt's performance today.   08/05/23: SLP targeted loud /a/ to improve muscle  memory for incr'd speech loudness and clarity. Pt produced average 90 dB with 7 reps. SLP then targeted sentence level responses and pt averaged 82dB - cues once for breathing like with loud /a/. Pt notably takes adequate breath with loud /a/ and only approx 2/3 as much when talking.    08/03/23: HEP has been more difficult since last session due to family matters (death in the family). SLP targeted loud /a/ to improve muscle memory for incr'd speech loudness and clarity. Pt produced average 88 dB with 5 reps. SLP then targeted sentence level responses and pt averaged 79dB - this improved to 83dB when asked to repeat. Spelling words in a category pt averaged 69dB. Telling 3 items/things in sequence (precede or follow) average loudnesse 66dB. SLP targeted loud /a/ to  improve muscle memory for incr'd speech lou of 68dB with extra time, given cognitive component. SLP gave pt homework for sentence level responses. AB noted in 70% of pt's sentence level responses.  07/29/23: In 18 minutes of conversation pt began 85% utterances with volume average 66dB. SLP targeted loud /a/ to improve muscle memory for incr'd speech loudness and clarity.  Pt produced average 91dB with 5 reps. SLP then targeted sentence level responses and pt averaged 85dB. SLP told pt to ensure he performs loud /a/, sentences, and practice louder speech with handouts for 20 minutes BID.  07/27/23: In 6 minutes of conversation pt began 75% utterances with volume average 67dB but had loudness fade by end of utterance. SLP targeted loud /a/ to improve muscle memory for incr'd speech loudness and clarity. Pt produced average 91dB with 7 reps. 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   07/22/23: Pt will need work with AB next session. SLP targeted loud /a/ to improve muscle  memory for incr'd speech loudness and clarity. Pt produced average 89dB with 5 reps. SLP told pt to incr reps to 7, BID. Pt thought of replacement everyday sentence to "What inning is it" however this will not be everyday so pt will look for another to substitute - he would like to keep this one for baseball season. SLP worked with pt on incr'ing his awareness of somatosensory input for WNL speech and he indicated incr in abdominal musculature recruitment. With single word responses pt req'd usual cues for loudness and intent to achieve loudness average 68dB. With phrase responses pt exhibited consistent loudness decay - SLP usual cues for loudness and intent/using abdominal musculature improved loudness across the phrase. Cues for posture and breath support were also helpful.  07/20/23: SLP worked with pt with digital recorder to incr awareness of his softer voice and to re-orient pt to louder, WNL volume speech. SLP was able to assist pt with reps of loud /a/ and his everyday sentences; SLP verified pt's everyday sentences would all still be pertinent/relevant. Pt decided he needed to change one of them and SLP told him to pay close attention to things he says until next session and possibly find a phrase/sentence to substitute into his list.  PATIENT EDUCATION: Education details: See "today's treatment" for details Person educated: Patient Education method: Explanation Education comprehension: verbalized understanding  HOME EXERCISE PROGRAM: Loud /a/ and everyday sentences, along with speech tasks matching ability level   GOALS: Goals reviewed with patient? Yes  SHORT TERM GOALS: Target date: 08/21/23  Pt will produce loud /a/ with at least low 90s dB average over three sessions  Baseline: 07/27/23 Goal status: INITIAL  2.  Pt will produce 16/20 sentence responses with 69dB over two sessions  Baseline:  Goal status: MODIFIED  3.  Pt will produce volume average 68dB in 5 minutes simple  conversation with rare min A over three sessions  Baseline:  Goal status: MODIFIED  4.  Pt will generate abdominal breathing 80% of the time when engaging in 5 minutes simple conversation in 2 sessions  Baseline:  Goal status: INITIAL   LONG TERM GOALS: Target date: 09/18/23  Pt will maintain average 69dB over 8 minute simple-mod complex conversation with rare min A in three sessions  Baseline:  Goal status: INITIAL  2.  Pt will remain  100% intelligible out of the speech room for 10 minute conversation, with rare nonverbal cues Baseline:  Goal status: INITIAL  3.  pt will use abdominal breathing 75% of the time in 8 minutes conversation in three sessions  Baseline:  Goal status: INITIAL  4.  Pt will score higher on CES than initial administration  Baseline:  Goal status: INITIAL   ASSESSMENT:  CLINICAL IMPRESSION: Patient is a 72 y.o. male who was seen today for treatment of speech clarity who, on day of eval, demonstrated mod dysarthria and reduced breath support due to Parkinson's disease. See "today's treatment" for more details on today's session. Pt would benefit from cont'd skilled ST targeting loudness in conversation. At this time he does not endorse swallowing difficulty but SLP will cont to monitor informally and goal/s added PRN. Memory compensations targeted previous therapy course are reportedly still helpful for pt.  OBJECTIVE IMPAIRMENTS: Objective impairments include attention, memory, and dysarthria. These impairments are limiting patient from ADLs/IADLs and effectively communicating at home and in community.Factors affecting potential to achieve goals and functional outcome are previous level of function and severity of impairments.. Patient will benefit from skilled SLP services to address above impairments and improve overall function.  REHAB POTENTIAL: Good  PLAN:  SLP FREQUENCY: 2x/week  SLP DURATION: 8 weeks  PLANNED INTERVENTIONS: 92507 Treatment of  speech (30 or 45 min) , Environmental controls, Cueing hierachy, Internal/external aids, Functional tasks, Multimodal communication approach, SLP instruction and feedback, Compensatory strategies, and Patient/family education    [facility], CCC-SLP 08/12/2023, 2:07 PM

## 2023-08-17 ENCOUNTER — Ambulatory Visit: Payer: Medicare Other

## 2023-08-17 DIAGNOSIS — G20A1 Parkinson's disease without dyskinesia, without mention of fluctuations: Secondary | ICD-10-CM | POA: Diagnosis not present

## 2023-08-17 DIAGNOSIS — R293 Abnormal posture: Secondary | ICD-10-CM | POA: Diagnosis not present

## 2023-08-17 DIAGNOSIS — M6281 Muscle weakness (generalized): Secondary | ICD-10-CM | POA: Diagnosis not present

## 2023-08-17 DIAGNOSIS — R2689 Other abnormalities of gait and mobility: Secondary | ICD-10-CM | POA: Diagnosis not present

## 2023-08-17 DIAGNOSIS — R2681 Unsteadiness on feet: Secondary | ICD-10-CM | POA: Diagnosis not present

## 2023-08-17 DIAGNOSIS — R41841 Cognitive communication deficit: Secondary | ICD-10-CM

## 2023-08-17 DIAGNOSIS — R471 Dysarthria and anarthria: Secondary | ICD-10-CM

## 2023-08-17 DIAGNOSIS — R29818 Other symptoms and signs involving the nervous system: Secondary | ICD-10-CM | POA: Diagnosis not present

## 2023-08-17 NOTE — Therapy (Signed)
OUTPATIENT SPEECH LANGUAGE PATHOLOGY PARKINSON'S TREATMENT   Patient Name: [name] [name] MRN: [id number] DOB:03/20/1951, 72 y.o., male Today's Date: 08/17/2023  PCP: Arva Chafe, MD REFERRING PROVIDER: Kerin Salen, DO  END OF SESSION:  End of Session - 08/17/23 1427     Visit Number 9    Number of Visits 17    Date for SLP Re-Evaluation 09/18/23    SLP Start Time 1408    SLP Stop Time  1445    SLP Time Calculation (min) 37 min    Activity Tolerance Patient tolerated treatment well               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   Mild neurocognitive disorder due to Parkinson's disease 01/10/2022   Non-Hodgkin lymphoma 2007   diffuse- 6 cycles of chemo with R [facility]OP Rituxan - last dose 10/08   Nonspecific elevation of levels of transaminase or lactic acid dehydrogenase (LDH) 07/06/2008   Parkinson's disease 07/27/2014   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/02/2015   Past Surgical History:  Procedure Laterality Date   CHOLECYSTECTOMY, LAPAROSCOPIC     EYE SURGERY     x2 as child   PORT-A-CATH REMOVAL     PORTACATH PLACEMENT     SPLENECTOMY     TONSILLECTOMY     Patient Active Problem List   Diagnosis Date Noted   Mild neurocognitive disorder due to Parkinson's disease 01/10/2022   Gastroesophageal reflux disease 03/07/2020   Localized swelling of right lower extremity 04/12/2019   DOE (dyspnea on exertion) 07/15/2018   10 year risk of MI or stroke 7.5% or greater 04/12/2018   Post-splenectomy 04/02/2015   Meige syndrome (blepharospasm with oromandibular  dystonia) 07/27/2014   Dysphagia, neurologic 07/27/2014   Parkinson's disease (HCC) 07/27/2014   Dystonia 07/10/2014   Low back pain 11/21/2011   Nonspecific elevation of levels of transaminase or lactic acid dehydrogenase (LDH) 07/06/2008   Enlarged lymph nodes 07/28/2007   Non-Hodgkin lymphoma 03/23/2007    ONSET DATE: Dx in mid-2010's; script dated 06/04/23  REFERRING DIAG:  G20.A1 (ICD-10-CM) - Parkinson's disease without dyskinesia or fluctuating manifestations (HCC)      THERAPY DIAG:  Dysarthria and anarthria  Cognitive communication deficit  Rationale for Evaluation and Treatment: Rehabilitation  SUBJECTIVE:   SUBJECTIVE STATEMENT: "I trailed off there."   Pt accompanied by: self  PERTINENT HISTORY: Meige syndrome, insomnia, RBD. Pt well known to this SLP - previous ST course d/c'd in April 2024 with pt having met all LTGs.   PAIN:  Are you having pain? No  FALLS: Has patient fallen in last 6 months?  No  PATIENT GOALS: Improve with speech loudness   OBJECTIVE:  Note: Objective measures were completed at Evaluation unless otherwise noted.  DIAGNOSTIC FINDINGS:  From neuropsych evaluation report dated April 2023:  "Mr. Patricelli completed a comprehensive neuropsychological evaluation on 01/10/2022. Please refer to that encounter for the full report and recommendations. Briefly, results suggested isolated impaired performances across complex attention and semantic fluency. Performance variability was also exhibited across all aspects of learning and memory. Regarding etiology, the most likely culprit for ongoing  cognitive weakness remains his past history of Parkinson's disease. While processing speed was improved relative to what might be expected, dysfunction surrounding complex attention and encoding/retrieval deficits of memory are quite common in this illness. The fact that motor/gait dysfunction was noted years prior to concern surrounding cognitive decline is  also a typical timeline associated with Parkinson's disease. Mild anxiety and sleep dysfunction could further influence cognitive performances. Research surrounding cognitive side effects of pramipexole is mixed; however, I cannot rule out an ongoing side effect contribution as well."  .  PATIENT REPORTED OUTCOME MEASURES (PROM): Communication Effectiveness Survey: pt scored himself 16/32 on 07/22/23, with lower scores indicating greater impact of deficit on speech effectiveness/lower QOL.  TODAY'S TREATMENT:       AB=Abdominal Breathing                                                                                                                                  DATE:  08/17/23: Last PD meds 12:00, next 3:30. Pt completed HEP as directed 2/4 days (once other days instead of twice). Pt entered with conversation with mod A usually necessary for increased loudness. Loud /a/ targeted to improve muscle memory to improve loudness for speech in conversation - average 92dB. Sentences produced with average 82dB. Sentence responses averaged 64dB with occasional mod cues for loudness. Cues also throughout session today for posture and full breath.   08/12/23: . SLP ascertained pt practiced twice yesterday, and once already today. Suspect this is one reason for pt's improved average. Sentences were produced with average 81dB. 2-sentence responses were average 66dB. Pt with much vocal "arresting" today with strained voicing. SLP told pt to cont practicing.   08/10/23: 10 minutes of conversation prior to loud /a/ was average 64dB. Pt would improve to WNL loudness but only for approx 15 seconds and then spoke below WNL until the next time he realized he was lower than WNL. Loud /a/ targeted to improve muscle memory for improved loudness of speech in conversation was average 88 dB. Sentences were produced with average 81dB. SLP targeted sentence - level material for responses as pt was not performing as well as  previous session. Pt's average volume today with these responses was 66dB with more difficulty with multi-tasking (loudness and content) than previous sessions.SLP strongly encouraged pt to practice even if it is more difficult to find some alone setting/time in his house, based upon pt's performance today.   08/05/23: SLP targeted loud /a/ to improve muscle memory for incr'd speech loudness and clarity. Pt produced average 90 dB with 7 reps. SLP then targeted sentence level responses and pt averaged 82dB - cues once for breathing like with loud /a/. Pt notably takes adequate breath with loud /a/ and only approx 2/3 as much when talking.    08/03/23: HEP has been more difficult since last session due to family matters (death in the family). SLP targeted loud /a/ to improve muscle  memory for incr'd speech loudness and clarity. Pt produced average 88 dB with 5 reps. SLP then targeted sentence level responses and pt averaged 79dB - this improved to 83dB when asked to repeat. Spelling words in a category pt averaged 69dB. Telling 3 items/things in sequence (precede or follow) average loudnesse 66dB. SLP targeted loud /a/ to improve muscle memory for incr'd speech lou of 68dB with extra time, given cognitive component. SLP gave pt homework for sentence level responses. AB noted in 70% of pt's sentence level responses.  07/29/23: In 18 minutes of conversation pt began 85% utterances with volume average 66dB. SLP targeted loud /a/ to improve muscle memory for incr'd speech loudness and clarity.  Pt produced average 91dB with 5 reps. SLP then targeted sentence level responses and pt averaged 85dB. SLP told pt to ensure he performs loud /a/, sentences, and practice louder speech with handouts for 20 minutes BID.  07/27/23: In 6 minutes of conversation pt began 75% utterances with volume average 67dB but had loudness fade by end of utterance. SLP targeted loud /a/ to improve muscle memory for incr'd speech loudness and  clarity. Pt produced average 91dB with 7 reps. 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   07/22/23: Pt will need work with AB next session. SLP targeted loud /a/ to improve muscle memory for incr'd speech loudness and clarity. Pt produced average 89dB with 5 reps. SLP told pt to incr reps to 7, BID. Pt thought of replacement everyday sentence to "What inning is it" however this will not be everyday so pt will look for another to substitute - he would like to keep this one for baseball season. SLP worked with pt on incr'ing his awareness of somatosensory input for WNL speech and he indicated incr in abdominal musculature recruitment. With single word responses pt req'd usual cues for loudness and intent to achieve loudness average 68dB. With phrase responses pt exhibited consistent loudness decay - SLP usual cues for loudness and intent/using abdominal musculature improved loudness across the phrase. Cues for posture and breath support were also helpful.  07/20/23: SLP worked with pt with digital recorder to incr awareness of his softer voice and to re-orient pt to louder, WNL volume speech. SLP was able to assist pt with reps of loud /a/ and his everyday sentences; SLP verified pt's everyday sentences would all still be pertinent/relevant. Pt decided he needed to change one of them and SLP told him to pay close attention to things he says until next session and possibly find a phrase/sentence to substitute into his list.  PATIENT EDUCATION: Education details: See "today's treatment" for details Person educated: Patient Education method: Explanation Education comprehension: verbalized understanding  HOME EXERCISE PROGRAM: Loud /a/ and everyday sentences, along with speech tasks matching ability level   GOALS: Goals  reviewed with patient? Yes  SHORT TERM GOALS: Target date: 08/21/23  Pt will produce loud /a/ with at least low 90s dB average over three sessions  Baseline: 07/27/23 Goal status: INITIAL  2.  Pt will produce 16/20 sentence responses with 69dB over two sessions  Baseline:  Goal status: MODIFIED  3.  Pt will produce volume average 68dB in 5 minutes simple conversation with rare min A over three sessions  Baseline:  Goal status: MODIFIED  4.  Pt will generate abdominal breathing 80%  of the time when engaging in 5 minutes simple conversation in 2 sessions  Baseline:  Goal status: INITIAL   LONG TERM GOALS: Target date: 09/18/23  Pt will maintain average 69dB over 8 minute simple-mod complex conversation with rare min A in three sessions  Baseline:  Goal status: INITIAL  2.  Pt will remain 100% intelligible out of the speech room for 10 minute conversation, with rare nonverbal cues Baseline:  Goal status: INITIAL  3.  pt will use abdominal breathing 75% of the time in 8 minutes conversation in three sessions  Baseline:  Goal status: INITIAL  4.  Pt will score higher on CES than initial administration  Baseline:  Goal status: INITIAL   ASSESSMENT:  CLINICAL IMPRESSION: Patient is a 72 y.o. male who was seen today for treatment of speech clarity who, on day of eval, demonstrated mod dysarthria and reduced breath support due to Parkinson's disease. See "today's treatment" for more details on today's session. Pt would benefit from cont'd skilled ST targeting loudness in conversation. At this time he does not endorse swallowing difficulty but SLP will cont to monitor informally and goal/s added PRN. Memory compensations targeted previous therapy course are reportedly still helpful for pt.  OBJECTIVE IMPAIRMENTS: Objective impairments include attention, memory, and dysarthria. These impairments are limiting patient from ADLs/IADLs and effectively communicating at home and in  community.Factors affecting potential to achieve goals and functional outcome are previous level of function and severity of impairments.. Patient will benefit from skilled SLP services to address above impairments and improve overall function.  REHAB POTENTIAL: Good  PLAN:  SLP FREQUENCY: 2x/week  SLP DURATION: 8 weeks  PLANNED INTERVENTIONS: 92507 Treatment of speech (30 or 45 min) , Environmental controls, Cueing hierachy, Internal/external aids, Functional tasks, Multimodal communication approach, SLP instruction and feedback, Compensatory strategies, and Patient/family education    [facility], CCC-SLP 08/17/2023, 11:17 PM

## 2023-08-19 ENCOUNTER — Ambulatory Visit: Payer: Medicare Other

## 2023-08-19 DIAGNOSIS — R2689 Other abnormalities of gait and mobility: Secondary | ICD-10-CM | POA: Diagnosis not present

## 2023-08-19 DIAGNOSIS — G20A1 Parkinson's disease without dyskinesia, without mention of fluctuations: Secondary | ICD-10-CM | POA: Diagnosis not present

## 2023-08-19 DIAGNOSIS — R29818 Other symptoms and signs involving the nervous system: Secondary | ICD-10-CM | POA: Diagnosis not present

## 2023-08-19 DIAGNOSIS — R471 Dysarthria and anarthria: Secondary | ICD-10-CM

## 2023-08-19 DIAGNOSIS — R293 Abnormal posture: Secondary | ICD-10-CM | POA: Diagnosis not present

## 2023-08-19 DIAGNOSIS — R2681 Unsteadiness on feet: Secondary | ICD-10-CM | POA: Diagnosis not present

## 2023-08-19 DIAGNOSIS — M6281 Muscle weakness (generalized): Secondary | ICD-10-CM | POA: Diagnosis not present

## 2023-08-19 DIAGNOSIS — R41841 Cognitive communication deficit: Secondary | ICD-10-CM

## 2023-08-19 NOTE — Therapy (Signed)
OUTPATIENT SPEECH LANGUAGE PATHOLOGY PARKINSON'S TREATMENT/Progress note   Patient Name: [name] [name] MRN: [id number] DOB:April 20, 1951, 72 y.o., male Today's Date: 08/19/2023  PCP: Arva Chafe, MD REFERRING PROVIDER: Kerin Salen, DO  END OF SESSION:  End of Session - 08/19/23 1413     Visit Number 10    Number of Visits 17    Date for SLP Re-Evaluation 09/18/23    SLP Start Time 1407    SLP Stop Time  1445    SLP Time Calculation (min) 38 min    Activity Tolerance Patient tolerated treatment well               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   Mild neurocognitive disorder due to Parkinson's disease 01/10/2022   Non-Hodgkin lymphoma 2007   diffuse- 6 cycles of chemo with R [facility] Rituxan - last dose 10/08   Nonspecific elevation of levels of transaminase or lactic acid dehydrogenase (LDH) 07/06/2008   Parkinson's disease 07/27/2014   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/2015   Past Surgical History:  Procedure Laterality Date   CHOLECYSTECTOMY, LAPAROSCOPIC     EYE SURGERY     x2 as child   PORT-A-CATH REMOVAL     PORTACATH PLACEMENT     SPLENECTOMY     TONSILLECTOMY     Patient Active Problem List   Diagnosis Date Noted   Mild neurocognitive disorder due to Parkinson's disease 01/10/2022   Gastroesophageal reflux disease 03/07/2020   Localized swelling of right lower extremity 04/12/2019   DOE (dyspnea on exertion) 07/15/2018   10 year risk of MI or stroke 7.5% or greater 04/12/2018   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   Reason Skilled Services are Required: Pt has not reached full rehab potential   ONSET DATE: Dx in mid-2010's; script dated 06/04/23  REFERRING DIAG:  G20.A1 (ICD-10-CM) - Parkinson's disease without dyskinesia or fluctuating manifestations (HCC)      THERAPY DIAG:  Dysarthria and anarthria  Cognitive communication deficit  Rationale for Evaluation and Treatment: Rehabilitation  SUBJECTIVE:   SUBJECTIVE STATEMENT: Pt arrived and sat down and began conversation with mid 60s dB, ended conversation approx 6 minutes later with mid 60s dB after SLP cue for posture, full breath, and loudness.  Pt accompanied by: self  PERTINENT HISTORY: Meige syndrome, insomnia, RBD. Pt well known to this SLP - previous ST course d/c'd in April 2024 with pt having met all LTGs.   PAIN:  Are you having pain? No  FALLS: Has patient fallen in last 6 months?  No  PATIENT GOALS: Improve with speech loudness   OBJECTIVE:  Note: Objective measures were completed at Evaluation  unless otherwise noted.  DIAGNOSTIC FINDINGS:  From neuropsych evaluation report dated April 2023:  "Mr. Suddith completed a comprehensive neuropsychological evaluation on 01/10/2022. Please refer to that encounter for the full report and recommendations. Briefly, results suggested isolated impaired performances across complex attention and semantic fluency. Performance variability was also exhibited across all aspects of learning and memory. Regarding  etiology, the most likely culprit for ongoing cognitive weakness remains his past history of Parkinson's disease. While processing speed was improved relative to what might be expected, dysfunction surrounding complex attention and encoding/retrieval deficits of memory are quite common in this illness. The fact that motor/gait dysfunction was noted years prior to concern surrounding cognitive decline is also a typical timeline associated with Parkinson's disease. Mild anxiety and sleep dysfunction could further influence cognitive performances. Research surrounding cognitive side effects of pramipexole is mixed; however, I cannot rule out an ongoing side effect contribution as well."  .  PATIENT REPORTED OUTCOME MEASURES (PROM): Communication Effectiveness Survey: pt scored himself 16/32 on 07/22/23, with lower scores indicating greater impact of deficit on speech effectiveness/lower QOL.  TODAY'S TREATMENT:       AB=Abdominal Breathing                                                                                                                                  DATE:  08/19/23: Last PD meds 1100, next 1530. Loud /a/ targeted to improve muscle memory for improved loudness of speech in conversation was average 90 dB. Sentences were produced with average 82dB. 2-sentence responses resulted in average 68dB with rare min A for loudness. Vocal arresting noted rarely. Conversation between stimuli and/tasks was average 65dB with rare min A for increasing volume. Minimal vocal arresting heard.  08/17/23: Last PD meds 12:00, next 3:30. Pt completed HEP as directed 2/4 days (once other days instead of twice). Pt entered with conversation with mod A usually necessary for increased loudness. Loud /a/ targeted to improve muscle memory to improve loudness for speech in conversation - average 92dB. Sentences produced with average 82dB. Sentence responses averaged 64dB with occasional mod cues for loudness. Cues also  throughout session today for posture and full breath.   08/12/23: . SLP ascertained pt practiced twice yesterday, and once already today. Suspect this is one reason for pt's improved average. Sentences were produced with average 81dB. 2-sentence responses were average 66dB. Pt with much vocal "arresting" today with strained voicing. SLP told pt to cont practicing.   08/10/23: 10 minutes of conversation prior to loud /a/ was average 64dB. Pt would improve to WNL loudness but only for approx 15 seconds and then spoke below WNL until the next time he realized he was lower than WNL. Loud /a/ targeted to improve muscle memory for improved loudness of speech in conversation was average 88 dB. Sentences were produced with average 81dB. SLP targeted sentence - level material for responses as pt was not performing as well as  previous session. Pt's average volume today with these responses was 66dB with more difficulty with multi-tasking (loudness and content) than previous sessions.SLP strongly encouraged pt to practice even if it is more difficult to find some alone setting/time in his house, based upon pt's performance today.   08/05/23: SLP targeted loud /a/ to improve muscle memory for incr'd speech loudness and clarity. Pt produced average 90 dB with 7 reps. SLP then targeted sentence level responses and pt averaged 82dB - cues once for breathing like with loud /a/. Pt notably takes adequate breath with loud /a/ and only approx 2/3 as much when talking.    08/03/23: HEP has been more difficult since last session due to family matters (death in the family). SLP targeted loud /a/ to improve muscle memory for incr'd speech loudness and clarity. Pt produced average 88 dB with 5 reps. SLP then targeted sentence level responses and pt averaged 79dB - this improved to 83dB when asked to repeat. Spelling words in a category pt averaged 69dB. Telling 3 items/things in sequence (precede or follow) average loudnesse 66dB.  SLP targeted loud /a/ to improve muscle memory for incr'd speech lou of 68dB with extra time, given cognitive component. SLP gave pt homework for sentence level responses. AB noted in 70% of pt's sentence level responses.  07/29/23: In 18 minutes of conversation pt began 85% utterances with volume average 66dB. SLP targeted loud /a/ to improve muscle memory for incr'd speech loudness and clarity.  Pt produced average 91dB with 5 reps. SLP then targeted sentence level responses and pt averaged 85dB. SLP told pt to ensure he performs loud /a/, sentences, and practice louder speech with handouts for 20 minutes BID.  07/27/23: In 6 minutes of conversation pt began 75% utterances with volume average 67dB but had loudness fade by end of utterance. SLP targeted loud /a/ to improve muscle memory for incr'd speech loudness and clarity. Pt produced average 91dB with 7 reps. 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   07/22/23: Pt will need work with AB next session. SLP targeted loud /a/ to improve muscle memory for incr'd speech loudness and clarity. Pt produced average 89dB with 5 reps. SLP told pt to incr reps to 7, BID. Pt thought of replacement everyday sentence to "What inning is it" however this will not be everyday so pt will look for another to substitute - he would like to keep this one for baseball season. SLP worked with pt on incr'ing his awareness of somatosensory input for WNL speech and he indicated incr in abdominal musculature recruitment. With single word responses pt req'd usual cues for loudness and intent to achieve loudness average 68dB. With phrase responses pt exhibited consistent loudness decay - SLP usual cues for loudness and intent/using abdominal musculature improved loudness across the phrase. Cues for posture and  breath support were also helpful.  07/20/23: SLP worked with pt with digital recorder to incr awareness of his softer voice and to re-orient pt to louder, WNL volume speech. SLP was able to assist pt with reps of loud /a/ and his everyday sentences; SLP verified pt's everyday sentences would all still be pertinent/relevant. Pt decided he needed to change one of them and SLP told him to pay close attention to things he says until next session and possibly find a phrase/sentence to  substitute into his list.  PATIENT EDUCATION: Education details: See "today's treatment" for details Person educated: Patient Education method: Explanation Education comprehension: verbalized understanding  HOME EXERCISE PROGRAM: Loud /a/ and everyday sentences, along with speech tasks matching ability level   GOALS: Goals reviewed with patient? Yes  SHORT TERM GOALS: Target date: 08/21/23  Pt will produce loud /a/ with at least low 90s dB average over three sessions  Baseline: 07/27/23, 08/17/23 Goal status: met  2.  Pt will produce 16/20 sentence responses with 69dB over two sessions  Baseline:  Goal status: not met  3.  Pt will produce volume average 68dB in 5 minutes simple conversation with rare min A over three sessions  Baseline:  Goal status: not met  4.  Pt will generate abdominal breathing 80% of the time when engaging in 5 minutes simple conversation in 2 sessions  Baseline:  Goal status: INITIAL   LONG TERM GOALS: Target date: 09/18/23  Pt will maintain average 67dB over 8 minute simple conversation with rare min A in three sessions  Baseline:  Goal status: Modified  2.  Pt will remain 100% intelligible out of the speech room for 8- minute conversation, with rare nonverbal cues Baseline:  Goal status: Modified  3.  pt will use appropriate posture 75% of the time with rare min A in 8 minutes conversation in three sessions  Baseline:  Goal status: Modified  4.  Pt will score higher  on CES than initial administration  Baseline:  Goal status: INITIAL   ASSESSMENT:  CLINICAL IMPRESSION: Goals checked, and LTGs modified accordingly. SLP changed AB goal to goal for posture. Patient is a 72 y.o. male who was seen today for treatment of speech clarity who, on day of eval, demonstrated mod dysarthria and reduced breath support due to Parkinson's disease. See "today's treatment" for more details on today's session. Pt would benefit from cont'd skilled ST targeting loudness in conversation. At this time he does not endorse swallowing difficulty but SLP will cont to monitor informally and goal/s added PRN. Memory compensations targeted previous therapy course are reportedly still helpful for pt.  OBJECTIVE IMPAIRMENTS: Objective impairments include attention, memory, and dysarthria. These impairments are limiting patient from ADLs/IADLs and effectively communicating at home and in community.Factors affecting potential to achieve goals and functional outcome are previous level of function and severity of impairments.. Patient will benefit from skilled SLP services to address above impairments and improve overall function.  REHAB POTENTIAL: Good  PLAN:  SLP FREQUENCY: 2x/week  SLP DURATION: 8 weeks  PLANNED INTERVENTIONS: 92507 Treatment of speech (30 or 45 min) , Environmental controls, Cueing hierachy, Internal/external aids, Functional tasks, Multimodal communication approach, SLP instruction and feedback, Compensatory strategies, and Patient/family education    [facility], CCC-SLP 08/19/2023, 2:13 PM

## 2023-08-24 ENCOUNTER — Ambulatory Visit: Payer: Medicare Other | Attending: Neurology

## 2023-08-24 DIAGNOSIS — R41841 Cognitive communication deficit: Secondary | ICD-10-CM | POA: Insufficient documentation

## 2023-08-24 DIAGNOSIS — R471 Dysarthria and anarthria: Secondary | ICD-10-CM | POA: Diagnosis not present

## 2023-08-24 NOTE — Therapy (Signed)
OUTPATIENT SPEECH LANGUAGE PATHOLOGY PARKINSON'S TREATMENT   Patient Name: [name] [name] MRN: [id number] DOB:1950-10-21, 72 y.o., male Today's Date: 08/24/2023  PCP: Arva Chafe, MD REFERRING PROVIDER: Kerin Salen, DO  END OF SESSION:  End of Session - 08/24/23 1448     Visit Number 11    Number of Visits 17    Date for SLP Re-Evaluation 09/18/23    SLP Start Time 1404    SLP Stop Time  1444    SLP Time Calculation (min) 40 min    Activity Tolerance Patient tolerated treatment well                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   Mild neurocognitive disorder due to Parkinson's disease 01/10/2022   Non-Hodgkin lymphoma 2007   diffuse- 6 cycles of chemo with R [facility] Rituxan - last dose 10/08   Nonspecific elevation of levels of transaminase or lactic acid dehydrogenase (LDH) 07/06/2008   Parkinson's disease 07/27/2014   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   Past Surgical History:  Procedure Laterality Date   CHOLECYSTECTOMY, LAPAROSCOPIC     EYE SURGERY     x2 as child   PORT-A-CATH REMOVAL     PORTACATH PLACEMENT     SPLENECTOMY     TONSILLECTOMY     Patient Active Problem List   Diagnosis Date Noted   Mild neurocognitive disorder due to Parkinson's disease 01/10/2022   Gastroesophageal reflux disease 03/07/2020   Localized swelling of right lower extremity 04/12/2019   DOE (dyspnea on exertion) 07/15/2018   10 year risk of MI or stroke 7.5% or greater 04/12/2018   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/2015   Meige syndrome (blepharospasm with oromandibular  dystonia) 07/27/2014   Dysphagia, neurologic 07/27/2014   Parkinson's disease (HCC) 07/27/2014   Dystonia 07/10/2014   Low back pain 11/21/2011   Nonspecific elevation of levels of transaminase or lactic acid dehydrogenase (LDH) 07/06/2008   Enlarged lymph nodes 07/28/2007   Non-Hodgkin lymphoma 03/23/2007     ONSET DATE: Dx in mid-2010's; script dated 06/04/23  REFERRING DIAG:  G20.A1 (ICD-10-CM) - Parkinson's disease without dyskinesia or fluctuating manifestations (HCC)      THERAPY DIAG:  Dysarthria and anarthria  Cognitive communication deficit  Rationale for Evaluation and Treatment: Rehabilitation  SUBJECTIVE:   SUBJECTIVE STATEMENT: "Thursday went well (with talking) but I got worn out."  Pt accompanied by: self  PERTINENT HISTORY: Meige syndrome, insomnia, RBD. Pt well known to this SLP - previous ST course d/c'd in April 2024 with pt having met all LTGs.   PAIN:  Are you having pain? No  FALLS: Has patient fallen in last 6 months?  No  PATIENT GOALS: Improve with speech loudness   OBJECTIVE:  Note: Objective measures were completed at Evaluation unless otherwise noted.  DIAGNOSTIC FINDINGS:  From neuropsych evaluation report dated April 2023:  "Mr. Brouillette completed a comprehensive neuropsychological evaluation on 01/10/2022. Please refer to that encounter for the full report and recommendations. Briefly, results suggested isolated impaired performances across complex attention and semantic fluency. Performance variability was also exhibited across all aspects of learning and memory. Regarding  etiology, the most likely culprit for ongoing cognitive weakness remains his past history of Parkinson's disease. While processing speed was improved relative to what might be expected, dysfunction surrounding complex attention and encoding/retrieval deficits of memory are quite common in this illness. The fact that motor/gait dysfunction was noted years prior to concern  surrounding cognitive decline is also a typical timeline associated with Parkinson's disease. Mild anxiety and sleep dysfunction could further influence cognitive performances. Research surrounding cognitive side effects of pramipexole is mixed; however, I cannot rule out an ongoing side effect contribution as well."  .  PATIENT REPORTED OUTCOME MEASURES (PROM): Communication Effectiveness Survey: pt scored himself 16/32 on 07/22/23, with lower scores indicating greater impact of deficit on speech effectiveness/lower QOL.  TODAY'S TREATMENT:       AB=Abdominal Breathing                                                                                                                                  DATE:  08/24/23: Conversation prior to loud /a/ with average 71dB. Pt with vocal "arresting"/"freezing" approx 40% of the time. Loud /a/ performed today with average low 90s dB, conversation afterwards 15 minutes with rare increasing to occasional cues for full breath and loud speech - minimal difficulty with voice "arrest"; Average loudness 69dB. Pt possible to decr to once/week if progress cont next week.   08/19/23: Last PD meds 1100, next 1530. Loud /a/ targeted to improve muscle memory for improved loudness of speech in conversation was average 90 dB. Sentences were produced with average 82dB. 2-sentence responses resulted in average 68dB with rare min A for loudness. Vocal arresting noted rarely. Conversation between stimuli and/tasks was average 65dB with rare min A for increasing volume. Minimal vocal arresting heard.  08/17/23: Last PD meds 12:00, next 3:30. Pt completed HEP as directed 2/4 days (once other days instead of twice). Pt entered with conversation with mod A usually necessary for increased loudness. Loud /a/ targeted to improve muscle memory to improve loudness for speech in conversation - average 92dB. Sentences produced with average 82dB. Sentence responses averaged 64dB with occasional mod  cues for loudness. Cues also throughout session today for posture and full breath.   08/12/23: . SLP ascertained pt practiced twice yesterday, and once already today. Suspect this is one reason for pt's improved average. Sentences were produced with average 81dB. 2-sentence responses were average 66dB. Pt with much vocal "arresting" today with strained voicing. SLP told pt to cont practicing.   08/10/23: 10 minutes of conversation prior to loud /a/ was average 64dB. Pt would improve to WNL loudness but only for approx 15 seconds and then spoke below WNL until the next time he realized he was lower than WNL. Loud /a/ targeted to improve muscle memory for improved loudness of speech in conversation was average 88 dB. Sentences were produced with average 81dB. SLP targeted sentence - level material for responses as pt was  not performing as well as previous session. Pt's average volume today with these responses was 66dB with more difficulty with multi-tasking (loudness and content) than previous sessions.SLP strongly encouraged pt to practice even if it is more difficult to find some alone setting/time in his house, based upon pt's performance today.   08/05/23: SLP targeted loud /a/ to improve muscle memory for incr'd speech loudness and clarity. Pt produced average 90 dB with 7 reps. SLP then targeted sentence level responses and pt averaged 82dB - cues once for breathing like with loud /a/. Pt notably takes adequate breath with loud /a/ and only approx 2/3 as much when talking.    08/03/23: HEP has been more difficult since last session due to family matters (death in the family). SLP targeted loud /a/ to improve muscle memory for incr'd speech loudness and clarity. Pt produced average 88 dB with 5 reps. SLP then targeted sentence level responses and pt averaged 79dB - this improved to 83dB when asked to repeat. Spelling words in a category pt averaged 69dB. Telling 3 items/things in sequence (precede or  follow) average loudnesse 66dB. SLP targeted loud /a/ to improve muscle memory for incr'd speech lou of 68dB with extra time, given cognitive component. SLP gave pt homework for sentence level responses. AB noted in 70% of pt's sentence level responses.  07/29/23: In 18 minutes of conversation pt began 85% utterances with volume average 66dB. SLP targeted loud /a/ to improve muscle memory for incr'd speech loudness and clarity.  Pt produced average 91dB with 5 reps. SLP then targeted sentence level responses and pt averaged 85dB. SLP told pt to ensure he performs loud /a/, sentences, and practice louder speech with handouts for 20 minutes BID.  07/27/23: In 6 minutes of conversation pt began 75% utterances with volume average 67dB but had loudness fade by end of utterance. SLP targeted loud /a/ to improve muscle memory for incr'd speech loudness and clarity. Pt produced average 91dB with 7 reps. 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   07/22/23: Pt will need work with AB next session. SLP targeted loud /a/ to improve muscle memory for incr'd speech loudness and clarity. Pt produced average 89dB with 5 reps. SLP told pt to incr reps to 7, BID. Pt thought of replacement everyday sentence to "What inning is it" however this will not be everyday so pt will look for another to substitute - he would like to keep this one for baseball season. SLP worked with pt on incr'ing his awareness of somatosensory input for WNL speech and he indicated incr in abdominal musculature recruitment. With single word responses pt req'd usual cues for loudness and intent to achieve loudness average 68dB. With phrase responses pt exhibited consistent loudness decay - SLP usual cues for loudness and intent/using abdominal musculature improved loudness across  the phrase. Cues for posture and breath support were also helpful.  07/20/23: SLP worked with pt with digital recorder to incr awareness of his softer voice and to re-orient pt to louder, WNL volume speech. SLP was able to assist pt with reps of loud /a/ and his everyday sentences; SLP verified pt's everyday sentences would all still be pertinent/relevant. Pt decided he needed to change one of them and SLP told him to pay close attention to things he says until next session and  possibly find a phrase/sentence to substitute into his list.  PATIENT EDUCATION: Education details: See "today's treatment" for details Person educated: Patient Education method: Explanation Education comprehension: verbalized understanding  HOME EXERCISE PROGRAM: Loud /a/ and everyday sentences, along with speech tasks matching ability level   GOALS: Goals reviewed with patient? Yes  SHORT TERM GOALS: Target date: 08/21/23  Pt will produce loud /a/ with at least low 90s dB average over three sessions  Baseline: 07/27/23, 08/17/23 Goal status: met  2.  Pt will produce 16/20 sentence responses with 69dB over two sessions  Baseline:  Goal status: not met  3.  Pt will produce volume average 68dB in 5 minutes simple conversation with rare min A over three sessions  Baseline:  Goal status: not met  4.  Pt will generate abdominal breathing 80% of the time when engaging in 5 minutes simple conversation in 2 sessions  Baseline:  Goal status: INITIAL   LONG TERM GOALS: Target date: 09/18/23  Pt will maintain average 67dB over 8 minute simple conversation with rare min A in three sessions  Baseline:  Goal status: Modified  2.  Pt will remain 100% intelligible out of the speech room for 8- minute conversation, with rare nonverbal cues Baseline:  Goal status: Modified  3.  pt will use appropriate posture 75% of the time with rare min A in 8 minutes conversation in three sessions  Baseline:  Goal status:  Modified  4.  Pt will score higher on CES than initial administration  Baseline:  Goal status: INITIAL   ASSESSMENT:  CLINICAL IMPRESSION: Goals checked, and LTGs modified accordingly. SLP changed AB goal to goal for posture. Patient is a 72 y.o. male who was seen today for treatment of speech clarity who, on day of eval, demonstrated mod dysarthria and reduced breath support due to Parkinson's disease. See "today's treatment" for more details on today's session. Pt states his speech appears louder in conversation and louder voice is transferring to conversation during ST sessions. Pt would benefit from cont'd skilled ST targeting loudness in conversation. At this time he does not endorse swallowing difficulty but SLP will cont to monitor informally and goal/s added PRN. Memory compensations targeted previous therapy course are reportedly still helpful for pt.  OBJECTIVE IMPAIRMENTS: Objective impairments include attention, memory, and dysarthria. These impairments are limiting patient from ADLs/IADLs and effectively communicating at home and in community.Factors affecting potential to achieve goals and functional outcome are previous level of function and severity of impairments.. Patient will benefit from skilled SLP services to address above impairments and improve overall function.  REHAB POTENTIAL: Good  PLAN:  SLP FREQUENCY: 2x/week  SLP DURATION: 8 weeks  PLANNED INTERVENTIONS: 92507 Treatment of speech (30 or 45 min) , Environmental controls, Cueing hierachy, Internal/external aids, Functional tasks, Multimodal communication approach, SLP instruction and feedback, Compensatory strategies, and Patient/family education    [facility], CCC-SLP 08/24/2023, 2:49 PM

## 2023-08-26 ENCOUNTER — Ambulatory Visit: Payer: Medicare Other

## 2023-08-27 ENCOUNTER — Ambulatory Visit: Payer: Medicare Other

## 2023-08-27 VITALS — Ht 65.0 in | Wt 191.0 lb

## 2023-08-27 DIAGNOSIS — Z0000 Encounter for general adult medical examination without abnormal findings: Secondary | ICD-10-CM

## 2023-08-27 NOTE — Progress Notes (Signed)
Subjective:   [name] [name] is a 72 y.o. male who presents for Medicare Annual/Subsequent preventive examination.  Visit Complete: Virtual I connected with  [name] [name] on 08/27/23 by a audio enabled telemedicine application and verified that I am speaking with the correct person using two identifiers.  Patient Location: Home  Provider Location: Home Office  I discussed the limitations of evaluation and management by telemedicine. The patient expressed understanding and agreed to proceed.  Vital Signs: Because this visit was a virtual/telehealth visit, some criteria may be missing or patient reported. Any vitals not documented were not able to be obtained and vitals that have been documented are patient reported.   Cardiac Risk Factors include: advanced age (>15men, >54 women);male gender     Objective:    Today's Vitals   08/27/23 1319  Weight: 191 lb (86.6 kg)  Height: 5\' 5"  (1.651 m)   Body mass index is 31.78 kg/m.     08/27/2023    1:26 PM 06/23/2023    2:03 PM 06/23/2023    1:30 PM 04/10/2023    8:09 AM 10/09/2022    2:18 PM 10/09/2022    1:17 PM 10/09/2022   12:39 PM  Advanced Directives  Does Patient Have a Medical Advance Directive? Yes Yes Yes Yes Yes Yes Yes  Type of Estate agent of [place];Living will Healthcare Power of [place];Living will Healthcare Power of [place];Living will Living will Healthcare Power of [place];Living will Living will Living will  Does patient want to make changes to medical advance directive?      No - Patient declined   Copy of Healthcare Power of Attorney in Chart? No - copy requested          Current Medications (verified) Outpatient Encounter Medications as of 08/27/2023  Medication Sig   benzonatate (TESSALON) 200 MG capsule Take 1 capsule (200 mg total) by mouth 2 (two) times daily as needed for cough. (Patient not taking: Reported on 06/23/2023)   trimethoprim-polymyxin b (POLYTRIM) ophthalmic  solution Place 1 drop into the right eye every 4 (four) hours. (Patient not taking: Reported on 06/23/2023)   aspirin 81 MG chewable tablet Chew 81 mg by mouth daily.   carbidopa-levodopa (SINEMET CR) 50-200 MG tablet TAKE 1 TABLET BY MOUTH EVERYDAY AT BEDTIME   carbidopa-levodopa (SINEMET IR) 25-100 MG tablet TAKE 1.5 TABLET AT 7 AM/11 AM/ AND 1 TABLET AT 3 PM/7 PM   Cholecalciferol (VITAMIN D3) 3000 UNITS TABS Take 1,000 Units by mouth daily.    clonazePAM (KLONOPIN) 0.5 MG tablet TAKE 0.5 TABLETS (0.25 MG TOTAL) BY MOUTH AT BEDTIME.   COVID-19 mRNA bivalent vaccine, Pfizer, (PFIZER COVID-19 VAC BIVALENT) injection Inject into the muscle.   cyanocobalamin 100 MCG tablet Take 1,000 mcg by mouth daily.    famotidine (PEPCID) 10 MG tablet Take 10 mg by mouth 2 (two) times daily.   Melatonin 3 MG TABS Take 3 mg by mouth at bedtime.    mirtazapine (REMERON) 15 MG tablet Take 1 tablet (15 mg total) by mouth at bedtime.   pramipexole (MIRAPEX) 0.75 MG tablet TAKE 1 TABLET (0.75 MG TOTAL) BY MOUTH 3 (THREE) TIMES DAILY.   No facility-administered encounter medications on file as of 08/27/2023.    Allergies (verified) Patient has no known allergies.   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   Mild neurocognitive disorder due to Parkinson's disease 01/10/2022   Non-Hodgkin lymphoma 2007   diffuse- 6 cycles of chemo with R CHOP Rituxan - last dose 10/08   Nonspecific elevation of levels of transaminase or lactic acid dehydrogenase (LDH) 07/06/2008   Parkinson's disease 07/27/2014    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   Past Surgical History:  Procedure Laterality Date   CHOLECYSTECTOMY, LAPAROSCOPIC     EYE SURGERY     x2 as child   PORT-A-CATH REMOVAL     PORTACATH PLACEMENT     SPLENECTOMY     TONSILLECTOMY     Family History  Problem Relation Age of Onset   Heart disease Mother    Cancer Mother        lung   Heart disease Father    Social History   Socioeconomic History   Marital status: Married    Spouse name: Not on file   Number of children: 5   Years of education: 16   Highest education level: Bachelor's degree (e.g., BA, AB, BS)  Occupational History   Occupation: Retired    Comment: IT/former CIO  Tobacco Use   Smoking status: Never   Smokeless tobacco: Never  Vaping Use   Vaping status: Never Used  Substance and Sexual Activity   Alcohol use: No    Alcohol/week: 0.0 standard drinks of alcohol   Drug use: No   Sexual activity: Not on file  Other Topics Concern   Not on file  Social History Narrative   Right handed   3 story home   Lives with spouse   Social Determinants of Health   Financial Resource Strain: Low Risk  (08/27/2023)   Overall Financial Resource Strain (CARDIA)    Difficulty of Paying Living Expenses: Not hard at all  Food Insecurity: No Food Insecurity (08/27/2023)   Hunger Vital Sign    Worried About Running Out of Food in the Last Year: Never true    Ran Out of Food in the Last Year: Never true  Transportation Needs: No Transportation Needs (08/27/2023)   PRAPARE - Administrator, Civil Service (Medical): No    Lack of Transportation (Non-Medical): No  Physical Activity: Inactive (08/27/2023)   Exercise Vital Sign    Days of Exercise per Week: 0 days    Minutes of Exercise per Session: 0 min  Stress: No Stress Concern Present (08/27/2023)   [employer] of Occupational Health - Occupational Stress Questionnaire    Feeling of Stress : Not at all  Social Connections: Socially Integrated (08/27/2023)    Social Connection and Isolation Panel [NHANES]    Frequency of Communication with Friends and Family: More than three times a week    Frequency of Social Gatherings with Friends and Family: More than three times a week    Attends Religious Services: More than 4 times per year    Active Member of [employer] or Organizations: Yes    Attends Engineer, structural: More than 4 times per year    Marital Status: Married    Tobacco Counseling Counseling given: Not Answered   Clinical Intake:  Pre-visit preparation completed: Yes  Pain : No/denies pain     BMI - recorded: 31.78 Nutritional Status: BMI >  30  Obese Nutritional Risks: None Diabetes: No  How often do you need to have someone help you when you read instructions, pamphlets, or other written materials from your doctor or pharmacy?: 1 - Never  Interpreter Needed?: No  Information entered by :: Theresa Mulligan LPN   Activities of Daily Living    08/27/2023    1:24 PM  In your present state of health, do you have any difficulty performing the following activities:  Hearing? 0  Vision? 0  Difficulty concentrating or making decisions? 0  Walking or climbing stairs? 1  Comment Uses a Cane  Dressing or bathing? 0  Doing errands, shopping? 0  Preparing Food and eating ? N  Using the Toilet? N  In the past six months, have you accidently leaked urine? N  Do you have problems with loss of bowel control? N  Managing your Medications? N  Managing your Finances? N  Housekeeping or managing your Housekeeping? N    Patient Care Team: Sharlene Dory, DO as PCP - General (Family Medicine) Tat, Octaviano Batty, DO as Consulting Physician (Neurology)  Indicate any recent Medical Services you may have received from other than [facility] providers in the past year (date may be approximate).     Assessment:   This is a routine wellness examination for [facility].  Hearing/Vision screen Hearing Screening - Comments:: Denies  hearing difficulties   Vision Screening - Comments:: Wears rx glasses - up to date with routine eye exams with  [facility]   Goals Addressed               This Visit's Progress     Increase physical activity (pt-stated)         Depression Screen    08/27/2023    1:23 PM 03/20/2023    1:18 PM 08/07/2022    2:26 PM 07/04/2022   11:21 AM 08/05/2021   12:43 PM 12/24/2016    7:57 AM 05/22/2016    1:34 PM  PHQ 2/9 Scores  PHQ - 2 Score 0 0 0 0 0 0 0  PHQ- 9 Score  2  1       Fall Risk    08/27/2023    1:25 PM 04/10/2023    8:12 AM 03/20/2023    1:17 PM 10/06/2022    8:40 AM 08/07/2022    2:26 PM  Fall Risk   Falls in the past year? 1 1 1 1 1   Comment   06/26/2022    Number falls in past yr: 0 1 0 0 0  Injury with Fall? 0 0 1 1 1   Comment   knee injury.    Risk for fall due to : No Fall Risks  Impaired balance/gait  History of fall(s)  Follow up Falls prevention discussed Falls evaluation completed Falls evaluation completed  Falls evaluation completed    MEDICARE RISK AT HOME: Medicare Risk at Home Any stairs in or around the home?: Yes If so, are there any without handrails?: No Home free of loose throw rugs in walkways, pet beds, electrical cords, etc?: Yes Adequate lighting in your home to reduce risk of falls?: Yes Life alert?: No Use of a cane, walker or w/c?: Yes Grab bars in the bathroom?: Yes Shower chair or bench in shower?: Yes Elevated toilet seat or a handicapped toilet?: Yes  TIMED UP AND GO:  Was the test performed?  No    Cognitive Function:    04/29/2018    8:39 AM  MMSE - Mini Mental State Exam  Not completed: Unable to complete        08/27/2023    1:26 PM 08/07/2022    2:29 PM  6CIT Screen  What Year? 0 points 0 points  What month? 0 points 0 points  What time? 0 points 0 points  Count back from 20 0 points 0 points  Months in reverse 0 points 0 points  Repeat phrase 0 points 0 points  Total Score 0 points 0 points     Immunizations Immunization History  Administered Date(s) Administered   Fluad Quad(high Dose 65+) 07/14/2019, 08/05/2021, 07/04/2022   Influenza Whole 09/06/2007, 06/28/2008   Influenza, High Dose Seasonal PF 06/20/2016, 07/02/2017, 07/15/2018, 06/28/2020   Influenza,inj,Quad PF,6+ Mos 07/10/2014, 06/11/2015   Meningococcal B, OMV 04/02/2015   Meningococcal Conjugate 04/02/2015, 06/11/2015   PFIZER(Purple Top)SARS-COV-2 Vaccination 11/04/2019, 11/29/2019, 10/02/2020   Pfizer Covid-19 Vaccine Bivalent Booster 32yrs & up 08/08/2021   Pneumococcal Conjugate-13 04/02/2015   Pneumococcal Polysaccharide-23 06/23/2006, 07/01/2016   Td 05/04/2006, 06/20/2016    TDAP status: Up to date  Flu Vaccine status: Due, Education has been provided regarding the importance of this vaccine. Advised may receive this vaccine at local pharmacy or Health Dept. Aware to provide a copy of the vaccination record if obtained from local pharmacy or Health Dept. Verbalized acceptance and understanding.  Pneumococcal vaccine status: Up to date  Covid-19 vaccine status: Declined, Education has been provided regarding the importance of this vaccine but patient still declined. Advised may receive this vaccine at local pharmacy or Health Dept.or vaccine clinic. Aware to provide a copy of the vaccination record if obtained from local pharmacy or Health Dept. Verbalized acceptance and understanding.  Qualifies for Shingles Vaccine? Yes   Zostavax completed No   Shingrix Completed?: No.    Education has been provided regarding the importance of this vaccine. Patient has been advised to call insurance company to determine out of pocket expense if they have not yet received this vaccine. Advised may also receive vaccine at local pharmacy or Health Dept. Verbalized acceptance and understanding.  Screening Tests Health Maintenance  Topic Date Due   Zoster Vaccines- Shingrix (1 of 2) Never done   INFLUENZA VACCINE   04/23/2023   COVID-19 Vaccine (5 - 2023-24 season) 05/24/2023   Medicare Annual Wellness (AWV)  08/26/2024   Colonoscopy  06/04/2025   DTaP/Tdap/Td (3 - Tdap) 06/20/2026   Pneumonia Vaccine 51+ Years old  Completed   Hepatitis C Screening  Completed   HPV VACCINES  Aged Out    Health Maintenance  Health Maintenance Due  Topic Date Due   Zoster Vaccines- Shingrix (1 of 2) Never done   INFLUENZA VACCINE  04/23/2023   COVID-19 Vaccine (5 - 2023-24 season) 05/24/2023    Colorectal cancer screening: Type of screening: Colonoscopy. Completed 06/05/15. Repeat every 10 years     Additional Screening:  Hepatitis C Screening: does qualify; Completed 08/14/21  Vision Screening: Recommended annual ophthalmology exams for early detection of glaucoma and other disorders of the eye. Is the patient up to date with their annual eye exam?  Yes  Who is the provider or what is the name of the office in which the patient attends annual eye exams? [facility] If pt is not established with a provider, would they like to be referred to a provider to establish care? No .   Dental Screening: Recommended annual dental exams for proper oral hygiene    Community Resource Referral / Chronic  Care Management:  CRR required this visit?  No   CCM required this visit?  No     Plan:     I have personally reviewed and noted the following in the patient's chart:   Medical and social history Use of alcohol, tobacco or illicit drugs  Current medications and supplements including opioid prescriptions. Patient is not currently taking opioid prescriptions. Functional ability and status Nutritional status Physical activity Advanced directives List of other physicians Hospitalizations, surgeries, and ER visits in previous 12 months Vitals Screenings to include cognitive, depression, and falls Referrals and appointments  In addition, I have reviewed and discussed with patient certain preventive  protocols, quality metrics, and best practice recommendations. A written personalized care plan for preventive services as well as general preventive health recommendations were provided to patient.     Tillie Rung, LPN   [id number]   After Visit Summary: (MyChart) Due to this being a telephonic visit, the after visit summary with patients personalized plan was offered to patient via MyChart   Nurse Notes: None

## 2023-08-27 NOTE — Patient Instructions (Addendum)
[name] , Thank you for taking time to come for your Medicare Wellness Visit. I appreciate your ongoing commitment to your health goals. Please review the following plan we discussed and let me know if I can assist you in the future.   Referrals/Orders/Follow-Ups/Clinician Recommendations:   This is a list of the screening recommended for you and due dates:  Health Maintenance  Topic Date Due   Zoster (Shingles) Vaccine (1 of 2) Never done   Flu Shot  04/23/2023   COVID-19 Vaccine (5 - 2023-24 season) 05/24/2023   Medicare Annual Wellness Visit  08/26/2024   Colon Cancer Screening  06/04/2025   DTaP/Tdap/Td vaccine (3 - Tdap) 06/20/2026   Pneumonia Vaccine  Completed   Hepatitis C Screening  Completed   HPV Vaccine  Aged Out    Advanced directives: (Copy Requested) Please bring a copy of your health care power of attorney and living will to the office to be added to your chart at your convenience.  Next Medicare Annual Wellness Visit scheduled for next year: Yes

## 2023-08-31 ENCOUNTER — Ambulatory Visit: Payer: Medicare Other

## 2023-08-31 DIAGNOSIS — R471 Dysarthria and anarthria: Secondary | ICD-10-CM | POA: Diagnosis not present

## 2023-08-31 DIAGNOSIS — R41841 Cognitive communication deficit: Secondary | ICD-10-CM | POA: Diagnosis not present

## 2023-08-31 NOTE — Therapy (Signed)
OUTPATIENT SPEECH LANGUAGE PATHOLOGY PARKINSON'S TREATMENT   Patient Name: [name] [name] MRN: [id number] DOB:1951/02/28, 72 y.o., male Today's Date: 08/31/2023  PCP: Arva Chafe, MD REFERRING PROVIDER: Kerin Salen, DO  END OF SESSION:  End of Session - 08/31/23 2246     Visit Number 12    Number of Visits 17    Date for SLP Re-Evaluation 09/18/23    SLP Start Time 1404    SLP Stop Time  1445    SLP Time Calculation (min) 41 min    Activity Tolerance Patient tolerated treatment well                 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   Mild neurocognitive disorder due to Parkinson's disease 01/10/2022   Non-Hodgkin lymphoma 2007   diffuse- 6 cycles of chemo with R [facility] Rituxan - last dose 10/08   Nonspecific elevation of levels of transaminase or lactic acid dehydrogenase (LDH) 07/06/2008   Parkinson's disease 07/27/2014   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   Past Surgical History:  Procedure Laterality Date   CHOLECYSTECTOMY, LAPAROSCOPIC     EYE SURGERY     x2 as child   PORT-A-CATH REMOVAL     PORTACATH PLACEMENT     SPLENECTOMY     TONSILLECTOMY     Patient Active Problem List   Diagnosis Date Noted   Mild neurocognitive disorder due to Parkinson's disease 01/10/2022   Gastroesophageal reflux disease 03/07/2020   Localized swelling of right lower extremity 04/12/2019   DOE (dyspnea on exertion) 07/15/2018   10 year risk of MI or stroke 7.5% or greater 04/12/2018   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/2015   Meige syndrome (blepharospasm with oromandibular  dystonia) 07/27/2014   Dysphagia, neurologic 07/27/2014   Parkinson's disease (HCC) 07/27/2014   Dystonia 07/10/2014   Low back pain 11/21/2011   Nonspecific elevation of levels of transaminase or lactic acid dehydrogenase (LDH) 07/06/2008   Enlarged lymph nodes 07/28/2007   Non-Hodgkin lymphoma 03/23/2007     ONSET DATE: Dx in mid-2010's; script dated 06/04/23  REFERRING DIAG:  G20.A1 (ICD-10-CM) - Parkinson's disease without dyskinesia or fluctuating manifestations (HCC)      THERAPY DIAG:  Dysarthria and anarthria  Cognitive communication deficit  Rationale for Evaluation and Treatment: Rehabilitation  SUBJECTIVE:   SUBJECTIVE STATEMENT: "[name] [name] is still the one that has the most trouble (understanding me)"  Pt accompanied by: self  PERTINENT HISTORY: Meige syndrome, insomnia, RBD. Pt well known to this SLP - previous ST course d/c'd in April 2024 with pt having met all LTGs.   PAIN:  Are you having pain? No  FALLS: Has patient fallen in last 6 months?  No  PATIENT GOALS: Improve with speech loudness   OBJECTIVE:  Note: Objective measures were completed at Evaluation unless otherwise noted.  DIAGNOSTIC FINDINGS:  From neuropsych evaluation report dated April 2023:  "[name] [name] completed a comprehensive neuropsychological evaluation on 01/10/2022. Please refer to that encounter for the full report and recommendations. Briefly, results suggested isolated impaired performances across complex attention and semantic fluency. Performance variability was also exhibited across all aspects of learning  and memory. Regarding etiology, the most likely culprit for ongoing cognitive weakness remains his past history of Parkinson's disease. While processing speed was improved relative to what might be expected, dysfunction surrounding complex attention and encoding/retrieval deficits of memory are quite common in this illness. The fact that motor/gait dysfunction was noted years prior  to concern surrounding cognitive decline is also a typical timeline associated with Parkinson's disease. Mild anxiety and sleep dysfunction could further influence cognitive performances. Research surrounding cognitive side effects of pramipexole is mixed; however, I cannot rule out an ongoing side effect contribution as well."  .  PATIENT REPORTED OUTCOME MEASURES (PROM): Communication Effectiveness Survey: pt scored himself 16/32 on 07/22/23, with lower scores indicating greater impact of deficit on speech effectiveness/lower QOL.  TODAY'S TREATMENT:       AB=Abdominal Breathing                                                                                                                                  DATE:  08/31/23: Conversation prior to /a/ averaged 67dB. "Arresting" approx 40% of the time. SLP targeted loud /a/ to improve muscle memory for incr'd speech loudness and clarity. Pt produced average 91 dB with 10 reps. SLP then targeted everyday sentences and pt averaged 81dB. Speech after loud /a/ averaged 69dB for 7 minutes. Reiterated to pt to perform /a/, sentences, and linguistic homework for 10-15 minutes BID until next session.  08/24/23: Conversation prior to loud /a/ with average 71dB. Pt with vocal "arresting"/"freezing" approx 40% of the time. Loud /a/ performed today with average low 90s dB, conversation afterwards 15 minutes with rare increasing to occasional cues for full breath and loud speech - minimal difficulty with voice "arrest"; Average loudness 69dB. Pt possible to decr to once/week if progress cont next week.   08/19/23: Last PD meds 1100, next 1530. Loud /a/ targeted to improve muscle memory for improved loudness of speech in conversation was average 90 dB. Sentences were produced with average 82dB. 2-sentence responses resulted in average 68dB with rare min A for loudness. Vocal arresting noted rarely. Conversation between stimuli and/tasks was average 65dB with rare min A for  increasing volume. Minimal vocal arresting heard.  08/17/23: Last PD meds 12:00, next 3:30. Pt completed HEP as directed 2/4 days (once other days instead of twice). Pt entered with conversation with mod A usually necessary for increased loudness. Loud /a/ targeted to improve muscle memory to improve loudness for speech in conversation - average 92dB. Sentences produced with average 82dB. Sentence responses averaged 64dB with occasional mod cues for loudness. Cues also throughout session today for posture and full breath.   08/12/23: . SLP ascertained pt practiced twice yesterday, and once already today. Suspect this is one reason for pt's improved average. Sentences were produced with average 81dB. 2-sentence responses were average 66dB. Pt with much vocal "arresting" today with strained voicing. SLP told pt to cont practicing.   08/10/23:  10 minutes of conversation prior to loud /a/ was average 64dB. Pt would improve to WNL loudness but only for approx 15 seconds and then spoke below WNL until the next time he realized he was lower than WNL. Loud /a/ targeted to improve muscle memory for improved loudness of speech in conversation was average 88 dB. Sentences were produced with average 81dB. SLP targeted sentence - level material for responses as pt was not performing as well as previous session. Pt's average volume today with these responses was 66dB with more difficulty with multi-tasking (loudness and content) than previous sessions.SLP strongly encouraged pt to practice even if it is more difficult to find some alone setting/time in his house, based upon pt's performance today.   08/05/23: SLP targeted loud /a/ to improve muscle memory for incr'd speech loudness and clarity. Pt produced average 90 dB with 7 reps. SLP then targeted sentence level responses and pt averaged 82dB - cues once for breathing like with loud /a/. Pt notably takes adequate breath with loud /a/ and only approx 2/3 as much when  talking.    08/03/23: HEP has been more difficult since last session due to family matters (death in the family). SLP targeted loud /a/ to improve muscle memory for incr'd speech loudness and clarity. Pt produced average 88 dB with 5 reps. SLP then targeted sentence level responses and pt averaged 79dB - this improved to 83dB when asked to repeat. Spelling words in a category pt averaged 69dB. Telling 3 items/things in sequence (precede or follow) average loudnesse 66dB. SLP targeted loud /a/ to improve muscle memory for incr'd speech lou of 68dB with extra time, given cognitive component. SLP gave pt homework for sentence level responses. AB noted in 70% of pt's sentence level responses.  07/29/23: In 18 minutes of conversation pt began 85% utterances with volume average 66dB. SLP targeted loud /a/ to improve muscle memory for incr'd speech loudness and clarity.  Pt produced average 91dB with 5 reps. SLP then targeted sentence level responses and pt averaged 85dB. SLP told pt to ensure he performs loud /a/, sentences, and practice louder speech with handouts for 20 minutes BID.  07/27/23: In 6 minutes of conversation pt began 75% utterances with volume average 67dB but had loudness fade by end of utterance. SLP targeted loud /a/ to improve muscle memory for incr'd speech loudness and clarity. Pt produced average 91dB with 7 reps. 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   07/22/23: Pt will need work with AB next session. SLP targeted loud /a/ to improve muscle memory for incr'd speech loudness and clarity. Pt produced average 89dB with 5 reps. SLP told pt to incr reps to 7, BID. Pt thought of replacement everyday sentence to "What inning is it" however this will not be everyday so pt will look for another to substitute - he would  like to keep this one for baseball season. SLP worked with pt on incr'ing his awareness of somatosensory input for WNL speech and he indicated incr in abdominal musculature recruitment. With single word responses pt req'd usual cues for loudness and intent to achieve loudness average 68dB. With phrase responses pt exhibited consistent loudness decay - SLP usual cues for loudness and intent/using abdominal musculature improved loudness across the phrase. Cues for posture and breath support were also helpful.  07/20/23: SLP worked with pt with digital recorder to incr awareness of his softer voice and to re-orient pt to louder, WNL volume speech. SLP was able to assist pt with reps of loud /a/ and his everyday sentences; SLP verified pt's everyday sentences would all still be pertinent/relevant. Pt decided he needed to change one of them and SLP told him to pay close attention to things he says until next session and possibly find a phrase/sentence to substitute into his list.  PATIENT EDUCATION: Education details: See "today's treatment" for details Person educated: Patient Education method: Explanation Education comprehension: verbalized understanding  HOME EXERCISE PROGRAM: Loud /a/ and everyday sentences, along with speech tasks matching ability level   GOALS: Goals reviewed with patient? Yes  SHORT TERM GOALS: Target date: 08/21/23  Pt will produce loud /a/ with at least low 90s dB average over three sessions  Baseline: 07/27/23, 08/17/23 Goal status: met  2.  Pt will produce 16/20 sentence responses with 69dB over two sessions  Baseline:  Goal status: not met  3.  Pt will produce volume average 68dB in 5 minutes simple conversation with rare min A over three sessions  Baseline:  Goal status: not met  4.  Pt will generate abdominal breathing 80% of the time when engaging in 5 minutes simple conversation in 2 sessions  Baseline:  Goal status: INITIAL   LONG TERM GOALS: Target  date: 09/18/23  Pt will maintain average 67dB over 8 minute simple conversation with rare min A in three sessions  Baseline: 08/31/23 Goal status: Modified  2.  Pt will remain 100% intelligible out of the speech room for 8- minute conversation, with rare nonverbal cues Baseline:  Goal status: Modified  3.  pt will use appropriate posture 75% of the time with rare min A in 8 minutes conversation in three sessions  Baseline:  Goal status: Modified  4.  Pt will score higher on CES than initial administration  Baseline:  Goal status: INITIAL   ASSESSMENT:  CLINICAL IMPRESSION: Patient is a 72 y.o. male who was seen today for treatment of speech clarity who, on day of eval, demonstrated mod dysarthria and reduced breath support due to Parkinson's disease. See "today's treatment" for more details on today's session. Pt states his speech appears louder in conversation and louder voice is transferring to conversation during ST sessions. Pt would benefit from cont'd skilled ST targeting loudness in conversation. At this time he does not endorse swallowing difficulty but SLP will cont to monitor informally and goal/s added PRN. Memory compensations targeted previous therapy course are reportedly still helpful for pt.  OBJECTIVE IMPAIRMENTS: Objective impairments include attention, memory, and dysarthria. These impairments are limiting patient from ADLs/IADLs and effectively communicating at home and in community.Factors affecting potential to achieve goals and functional outcome are previous level of function and severity of impairments.. Patient will benefit from skilled SLP services to address above impairments and improve overall function.  REHAB POTENTIAL: Good  PLAN:  SLP FREQUENCY: 2x/week  SLP DURATION: 8 weeks  PLANNED INTERVENTIONS: 92507 Treatment of speech (30 or 45 min) , Environmental controls, Cueing hierachy, Internal/external aids, Functional tasks, Multimodal communication  approach, SLP instruction and feedback, Compensatory strategies, and Patient/family education    [facility], CCC-SLP 08/31/2023, 10:46 PM

## 2023-09-02 ENCOUNTER — Ambulatory Visit: Payer: Medicare Other

## 2023-09-02 DIAGNOSIS — R471 Dysarthria and anarthria: Secondary | ICD-10-CM | POA: Diagnosis not present

## 2023-09-02 DIAGNOSIS — R41841 Cognitive communication deficit: Secondary | ICD-10-CM

## 2023-09-02 NOTE — Therapy (Unsigned)
OUTPATIENT SPEECH LANGUAGE PATHOLOGY PARKINSON'S TREATMENT   Patient Name: [name] [name] MRN: [id number] DOB:October 05, 1950, 72 y.o., male Today's Date: 09/02/2023  PCP: Arva Chafe, MD REFERRING PROVIDER: Kerin Salen, DO  END OF SESSION:  End of Session - 09/02/23 1416     Visit Number 13    Number of Visits 17    Date for SLP Re-Evaluation 09/18/23    SLP Start Time 1406    SLP Stop Time  1446    SLP Time Calculation (min) 40 min    Activity Tolerance Patient tolerated treatment well                 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   Mild neurocognitive disorder due to Parkinson's disease 01/10/2022   Non-Hodgkin lymphoma 2007   diffuse- 6 cycles of chemo with R [facility]OP Rituxan - last dose 10/08   Nonspecific elevation of levels of transaminase or lactic acid dehydrogenase (LDH) 07/06/2008   Parkinson's disease 07/27/2014   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   Past Surgical History:  Procedure Laterality Date   CHOLECYSTECTOMY, LAPAROSCOPIC     EYE SURGERY     x2 as child   PORT-A-CATH REMOVAL     PORTACATH PLACEMENT     SPLENECTOMY     TONSILLECTOMY     Patient Active Problem List   Diagnosis Date Noted   Mild neurocognitive disorder due to Parkinson's disease 01/10/2022   Gastroesophageal reflux disease 03/07/2020   Localized swelling of right lower extremity 04/12/2019   DOE (dyspnea on exertion) 07/15/2018   10 year risk of MI or stroke 7.5% or greater 04/12/2018   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/02/2015   Meige syndrome (blepharospasm with oromandibular  dystonia) 07/27/2014   Dysphagia, neurologic 07/27/2014   Parkinson's disease (HCC) 07/27/2014   Dystonia 07/10/2014   Low back pain 11/21/2011   Nonspecific elevation of levels of transaminase or lactic acid dehydrogenase (LDH) 07/06/2008   Enlarged lymph nodes 07/28/2007   Non-Hodgkin lymphoma 03/23/2007     ONSET DATE: Dx in mid-2010's; script dated 06/04/23  REFERRING DIAG:  G20.A1 (ICD-10-CM) - Parkinson's disease without dyskinesia or fluctuating manifestations (HCC)      THERAPY DIAG:  Dysarthria and anarthria  Cognitive communication deficit  Rationale for Evaluation and Treatment: Rehabilitation  SUBJECTIVE:   SUBJECTIVE STATEMENT: "Last night was a big win."  Pt accompanied by: self  PERTINENT HISTORY: Meige syndrome, insomnia, RBD. Pt well known to this SLP - previous ST course d/c'd in April 2024 with pt having met all LTGs.   PAIN:  Are you having pain? No  FALLS: Has patient fallen in last 6 months?  No  PATIENT GOALS: Improve with speech loudness   OBJECTIVE:  Note: Objective measures were completed at Evaluation unless otherwise noted.  DIAGNOSTIC FINDINGS:  From neuropsych evaluation report dated April 2023:  "Mr. Reeves completed a comprehensive neuropsychological evaluation on 01/10/2022. Please refer to that encounter for the full report and recommendations. Briefly, results suggested isolated impaired performances across complex attention and semantic fluency. Performance variability was also exhibited across all aspects of learning and memory. Regarding etiology, the most  likely culprit for ongoing cognitive weakness remains his past history of Parkinson's disease. While processing speed was improved relative to what might be expected, dysfunction surrounding complex attention and encoding/retrieval deficits of memory are quite common in this illness. The fact that motor/gait dysfunction was noted years prior to concern surrounding cognitive decline  is also a typical timeline associated with Parkinson's disease. Mild anxiety and sleep dysfunction could further influence cognitive performances. Research surrounding cognitive side effects of pramipexole is mixed; however, I cannot rule out an ongoing side effect contribution as well."  .  PATIENT REPORTED OUTCOME MEASURES (PROM): Communication Effectiveness Survey: pt scored himself 16/32 on 07/22/23, with lower scores indicating greater impact of deficit on speech effectiveness/lower QOL.  TODAY'S TREATMENT:       AB=Abdominal Breathing                                                                                                                                  DATE:  09/02/23: Last PD med was 1200 - next at 1530. "She went in to the other room and closed the door. I started doing the /a/'s. I asked her a question afterwards and she told me she heard the question!" SLP targeted loud /a/ to improve pt's habitual loudness in conversation with average 90dB, sentences (Everyday) with average 83 dB. In short conversatioal segments of 4 minutes x5 pt req'd rare min A for maintaining volume at WNL levels, overall average 68dB.   08/31/23: Conversation prior to /a/ averaged 67dB. "Arresting" approx 40% of the time. SLP targeted loud /a/ to improve muscle memory for incr'd speech loudness and clarity. Pt produced average 91 dB with 10 reps. SLP then targeted everyday sentences and pt averaged 81dB. Speech after loud /a/ averaged 69dB for 7 minutes. Reiterated to pt to perform /a/, sentences, and linguistic homework for 10-15 minutes BID until next session.  08/24/23: Conversation prior to loud /a/ with average 71dB. Pt with vocal "arresting"/"freezing" approx 40% of the time. Loud /a/ performed today with average low 90s dB, conversation afterwards 15 minutes with rare increasing to occasional cues for full breath and loud speech - minimal difficulty with voice "arrest"; Average loudness 69dB. Pt possible to  decr to once/week if progress cont next week.   08/19/23: Last PD meds 1100, next 1530. Loud /a/ targeted to improve muscle memory for improved loudness of speech in conversation was average 90 dB. Sentences were produced with average 82dB. 2-sentence responses resulted in average 68dB with rare min A for loudness. Vocal arresting noted rarely. Conversation between stimuli and/tasks was average 65dB with rare min A for increasing volume. Minimal vocal arresting heard.  08/17/23: Last PD meds 12:00, next 3:30. Pt completed HEP as directed 2/4 days (once other days instead of twice). Pt entered with conversation with mod A usually necessary for increased loudness. Loud /a/ targeted to improve muscle memory to improve loudness for speech in conversation - average  92dB. Sentences produced with average 82dB. Sentence responses averaged 64dB with occasional mod cues for loudness. Cues also throughout session today for posture and full breath.   08/12/23: . SLP ascertained pt practiced twice yesterday, and once already today. Suspect this is one reason for pt's improved average. Sentences were produced with average 81dB. 2-sentence responses were average 66dB. Pt with much vocal "arresting" today with strained voicing. SLP told pt to cont practicing.   08/10/23: 10 minutes of conversation prior to loud /a/ was average 64dB. Pt would improve to WNL loudness but only for approx 15 seconds and then spoke below WNL until the next time he realized he was lower than WNL. Loud /a/ targeted to improve muscle memory for improved loudness of speech in conversation was average 88 dB. Sentences were produced with average 81dB. SLP targeted sentence - level material for responses as pt was not performing as well as previous session. Pt's average volume today with these responses was 66dB with more difficulty with multi-tasking (loudness and content) than previous sessions.SLP strongly encouraged pt to practice even if it is  more difficult to find some alone setting/time in his house, based upon pt's performance today.   08/05/23: SLP targeted loud /a/ to improve muscle memory for incr'd speech loudness and clarity. Pt produced average 90 dB with 7 reps. SLP then targeted sentence level responses and pt averaged 82dB - cues once for breathing like with loud /a/. Pt notably takes adequate breath with loud /a/ and only approx 2/3 as much when talking.    08/03/23: HEP has been more difficult since last session due to family matters (death in the family). SLP targeted loud /a/ to improve muscle memory for incr'd speech loudness and clarity. Pt produced average 88 dB with 5 reps. SLP then targeted sentence level responses and pt averaged 79dB - this improved to 83dB when asked to repeat. Spelling words in a category pt averaged 69dB. Telling 3 items/things in sequence (precede or follow) average loudnesse 66dB. SLP targeted loud /a/ to improve muscle memory for incr'd speech lou of 68dB with extra time, given cognitive component. SLP gave pt homework for sentence level responses. AB noted in 70% of pt's sentence level responses.  07/29/23: In 18 minutes of conversation pt began 85% utterances with volume average 66dB. SLP targeted loud /a/ to improve muscle memory for incr'd speech loudness and clarity.  Pt produced average 91dB with 5 reps. SLP then targeted sentence level responses and pt averaged 85dB. SLP told pt to ensure he performs loud /a/, sentences, and practice louder speech with handouts for 20 minutes BID.   PATIENT EDUCATION: Education details: See "today's treatment" for details Person educated: Patient Education method: Explanation Education comprehension: verbalized understanding  HOME EXERCISE PROGRAM: Loud /a/ and everyday sentences, along with speech tasks matching ability level   GOALS: Goals reviewed with patient? Yes  SHORT TERM GOALS: Target date: 08/21/23  Pt will produce loud /a/ with at  least low 90s dB average over three sessions  Baseline: 07/27/23, 08/17/23 Goal status: met  2.  Pt will produce 16/20 sentence responses with 69dB over two sessions  Baseline:  Goal status: not met  3.  Pt will produce volume average 68dB in 5 minutes simple conversation with rare min A over three sessions  Baseline:  Goal status: not met  4.  Pt will generate abdominal breathing 80% of the time when engaging in 5 minutes simple conversation in 2 sessions  Baseline:  Goal status:  INITIAL   LONG TERM GOALS: Target date: 09/18/23  Pt will maintain average 67dB over 8 minute simple conversation with rare min A in three sessions  Baseline: 08/31/23 Goal status: Modified  2.  Pt will remain 100% intelligible out of the speech room for 8- minute conversation, with rare nonverbal cues Baseline:  Goal status: Modified  3.  pt will use appropriate posture 75% of the time with rare min A in 8 minutes conversation in three sessions  Baseline:  Goal status: Modified  4.  Pt will score higher on CES than initial administration  Baseline:  Goal status: INITIAL   ASSESSMENT:  CLINICAL IMPRESSION: Patient is a 72 y.o. male who was seen today for treatment of speech clarity who, on day of eval, demonstrated mod dysarthria and reduced breath support due to Parkinson's disease. See "today's treatment" for more details on today's session. Pt states his speech appears louder in conversation and louder voice is transferring to conversation during ST sessions. Pt would benefit from cont'd skilled ST targeting loudness in conversation. At this time he does not endorse swallowing difficulty but SLP will cont to monitor informally and goal/s added PRN. Memory compensations targeted previous therapy course are reportedly still helpful for pt.  OBJECTIVE IMPAIRMENTS: Objective impairments include attention, memory, and dysarthria. These impairments are limiting patient from ADLs/IADLs and effectively  communicating at home and in community.Factors affecting potential to achieve goals and functional outcome are previous level of function and severity of impairments.. Patient will benefit from skilled SLP services to address above impairments and improve overall function.  REHAB POTENTIAL: Good  PLAN:  SLP FREQUENCY: 2x/week  SLP DURATION: 8 weeks  PLANNED INTERVENTIONS: 92507 Treatment of speech (30 or 45 min) , Environmental controls, Cueing hierachy, Internal/external aids, Functional tasks, Multimodal communication approach, SLP instruction and feedback, Compensatory strategies, and Patient/family education    [facility], CCC-SLP 09/02/2023, 2:17 PM

## 2023-09-09 ENCOUNTER — Ambulatory Visit: Payer: Medicare Other

## 2023-09-21 ENCOUNTER — Ambulatory Visit: Payer: Medicare Other

## 2023-09-21 ENCOUNTER — Other Ambulatory Visit: Payer: Self-pay | Admitting: Neurology

## 2023-09-21 DIAGNOSIS — R41841 Cognitive communication deficit: Secondary | ICD-10-CM

## 2023-09-21 DIAGNOSIS — R471 Dysarthria and anarthria: Secondary | ICD-10-CM | POA: Diagnosis not present

## 2023-09-21 NOTE — Therapy (Signed)
OUTPATIENT SPEECH LANGUAGE PATHOLOGY PARKINSON'S TREATMENT/RECERTIFICATION   Patient Name: [name] [name] MRN: [id number] DOB:December 13, 1950, 72 y.o., male Today's Date: 09/21/2023  PCP: Arva Chafe, MD REFERRING PROVIDER: Kerin Salen, DO  END OF SESSION:  End of Session - 09/21/23 1410     Visit Number 14    Number of Visits 22    Date for SLP Re-Evaluation 10/16/23    SLP Start Time 1410    SLP Stop Time  1445    SLP Time Calculation (min) 35 min    Activity Tolerance Patient tolerated treatment well                 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   Mild neurocognitive disorder due to Parkinson's disease 01/10/2022   Non-Hodgkin lymphoma 2007   diffuse- 6 cycles of chemo with R CHOP Rituxan - last dose 10/08   Nonspecific elevation of levels of transaminase or lactic acid dehydrogenase (LDH) 07/06/2008   Parkinson's disease 07/27/2014   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   Past Surgical History:  Procedure Laterality Date   CHOLECYSTECTOMY, LAPAROSCOPIC     EYE SURGERY     x2 as child   PORT-A-CATH REMOVAL     PORTACATH PLACEMENT     SPLENECTOMY     TONSILLECTOMY     Patient Active Problem List   Diagnosis Date Noted   Mild neurocognitive disorder due to Parkinson's disease 01/10/2022   Gastroesophageal reflux disease 03/07/2020   Localized swelling of right lower extremity 04/12/2019   DOE (dyspnea on exertion) 07/15/2018   10 year risk of MI or stroke 7.5% or greater 04/12/2018   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  Objective: See below  Goal Update: See below  Plan: Cont to see pt until max rehab potential reached. Suspect this will occur in next 6-8 sessions.  Reason Skilled Services are Required: Pt ahs not yet reached max potential.   ONSET DATE: Dx in mid-2010's; script dated 06/04/23  REFERRING DIAG:  G20.A1 (ICD-10-CM) - Parkinson's disease without dyskinesia or fluctuating manifestations (HCC)      THERAPY DIAG:  Dysarthria and anarthria  Cognitive communication deficit  Rationale for Evaluation and Treatment: Rehabilitation  SUBJECTIVE:   SUBJECTIVE STATEMENT: "Later in the evening it got hard to recover (loudness)." "([name]) is complimenting more more often (on a louder voice)."  Pt accompanied by: self  PERTINENT HISTORY: Meige syndrome, insomnia, RBD. Pt well known to this SLP - previous ST course d/c'd in April 2024 with pt having met all LTGs.   PAIN:  Are you having pain? No  FALLS: Has patient fallen in last 6 months?  No  PATIENT  GOALS: Improve with speech loudness   OBJECTIVE:  Note: Objective measures were completed at Evaluation unless otherwise noted.  DIAGNOSTIC FINDINGS:  From neuropsych evaluation report dated April 2023:  "Mr. Severs completed a comprehensive neuropsychological evaluation on 01/10/2022. Please refer to that encounter for the full report and recommendations. Briefly, results suggested isolated impaired performances across complex attention and  semantic fluency. Performance variability was also exhibited across all aspects of learning and memory. Regarding etiology, the most likely culprit for ongoing cognitive weakness remains his past history of Parkinson's disease. While processing speed was improved relative to what might be expected, dysfunction surrounding complex attention and encoding/retrieval deficits of memory are quite common in this illness. The fact that motor/gait dysfunction was noted years prior to concern surrounding cognitive decline is also a typical timeline associated with Parkinson's disease. Mild anxiety and sleep dysfunction could further influence cognitive performances. Research surrounding cognitive side effects of pramipexole is mixed; however, I cannot rule out an ongoing side effect contribution as well."  .  PATIENT REPORTED OUTCOME MEASURES (PROM): Communication Effectiveness Survey: pt scored himself 16/32 on 07/22/23, with lower scores indicating greater impact of deficit on speech effectiveness/lower QOL.  TODAY'S TREATMENT:       AB=Abdominal Breathing                                                                                                                                  DATE:  09/21/23: Last dose for PD was 1100, next dose 3:30PM. Pt stated "I caught  myself" when he was not speaking loud enough with family over the holiday.  SLP targeted loud /a/ to improve pt's habitual loudness in conversation with average 91dB, sentences (Everyday) with average 83 dB. No reduction in averages from previous reading two+ weeks ago.  Today SLP and pt spoke in conversational segments of 5 minutes x5, average 68dB. The last segment was 9 minutes and after 6 minutes pt loudness was impacted despite SLP cues for louder speech.   09/02/23: Last PD med was 1200 - next at 1530. "She went in to the other room and closed the door. I started doing the /a/'s. I asked her a question afterwards and she told me she heard the  question!" SLP targeted loud /a/ to improve pt's habitual loudness in conversation with average 90dB, sentences (Everyday) with average 83 dB. In short conversatioal segments of 4 minutes x5 pt req'd rare min A for maintaining volume at WNL levels, overall average 68dB.   08/31/23: Conversation prior to /a/ averaged 67dB. "Arresting" approx 40% of the time. SLP targeted loud /a/ to improve muscle memory for incr'd speech loudness and clarity. Pt produced average 91 dB with 10 reps. SLP then targeted everyday sentences and pt averaged 81dB. Speech after loud /a/ averaged 69dB for 7 minutes. Reiterated to pt to perform /a/, sentences, and linguistic homework for 10-15 minutes BID until next session.  08/24/23: Conversation prior to  loud /a/ with average 71dB. Pt with vocal "arresting"/"freezing" approx 40% of the time. Loud /a/ performed today with average low 90s dB, conversation afterwards 15 minutes with rare increasing to occasional cues for full breath and loud speech - minimal difficulty with voice "arrest"; Average loudness 69dB. Pt possible to decr to once/week if progress cont next week.   08/19/23: Last PD meds 1100, next 1530. Loud /a/ targeted to improve muscle memory for improved loudness of speech in conversation was average 90 dB. Sentences were produced with average 82dB. 2-sentence responses resulted in average 68dB with rare min A for loudness. Vocal arresting noted rarely. Conversation between stimuli and/tasks was average 65dB with rare min A for increasing volume. Minimal vocal arresting heard.  08/17/23: Last PD meds 12:00, next 3:30. Pt completed HEP as directed 2/4 days (once other days instead of twice). Pt entered with conversation with mod A usually necessary for increased loudness. Loud /a/ targeted to improve muscle memory to improve loudness for speech in conversation - average 92dB. Sentences produced with average 82dB. Sentence responses averaged 64dB with occasional mod cues for  loudness. Cues also throughout session today for posture and full breath.   08/12/23: . SLP ascertained pt practiced twice yesterday, and once already today. Suspect this is one reason for pt's improved average. Sentences were produced with average 81dB. 2-sentence responses were average 66dB. Pt with much vocal "arresting" today with strained voicing. SLP told pt to cont practicing.   08/10/23: 10 minutes of conversation prior to loud /a/ was average 64dB. Pt would improve to WNL loudness but only for approx 15 seconds and then spoke below WNL until the next time he realized he was lower than WNL. Loud /a/ targeted to improve muscle memory for improved loudness of speech in conversation was average 88 dB. Sentences were produced with average 81dB. SLP targeted sentence - level material for responses as pt was not performing as well as previous session. Pt's average volume today with these responses was 66dB with more difficulty with multi-tasking (loudness and content) than previous sessions.SLP strongly encouraged pt to practice even if it is more difficult to find some alone setting/time in his house, based upon pt's performance today.   08/05/23: SLP targeted loud /a/ to improve muscle memory for incr'd speech loudness and clarity. Pt produced average 90 dB with 7 reps. SLP then targeted sentence level responses and pt averaged 82dB - cues once for breathing like with loud /a/. Pt notably takes adequate breath with loud /a/ and only approx 2/3 as much when talking.    08/03/23: HEP has been more difficult since last session due to family matters (death in the family). SLP targeted loud /a/ to improve muscle memory for incr'd speech loudness and clarity. Pt produced average 88 dB with 5 reps. SLP then targeted sentence level responses and pt averaged 79dB - this improved to 83dB when asked to repeat. Spelling words in a category pt averaged 69dB. Telling 3 items/things in sequence (precede or follow)  average loudnesse 66dB. SLP targeted loud /a/ to improve muscle memory for incr'd speech lou of 68dB with extra time, given cognitive component. SLP gave pt homework for sentence level responses. AB noted in 70% of pt's sentence level responses.  07/29/23: In 18 minutes of conversation pt began 85% utterances with volume average 66dB. SLP targeted loud /a/ to improve muscle memory for incr'd speech loudness and clarity.  Pt produced average 91dB with 5 reps. SLP then targeted sentence level responses  and pt averaged 85dB. SLP told pt to ensure he performs loud /a/, sentences, and practice louder speech with handouts for 20 minutes BID.   PATIENT EDUCATION: Education details: See "today's treatment" for details Person educated: Patient Education method: Explanation Education comprehension: verbalized understanding  HOME EXERCISE PROGRAM: Loud /a/ and everyday sentences, along with speech tasks matching ability level   GOALS: Goals reviewed with patient? Yes  SHORT TERM GOALS: Target date: 08/21/23  Pt will produce loud /a/ with at least low 90s dB average over three sessions  Baseline: 07/27/23, 08/17/23 Goal status: met  2.  Pt will produce 16/20 sentence responses with 69dB over two sessions  Baseline:  Goal status: not met  3.  Pt will produce volume average 68dB in 5 minutes simple conversation with rare min A over three sessions  Baseline:  Goal status: not met  4.  Pt will generate abdominal breathing 80% of the time when engaging in 5 minutes simple conversation in 2 sessions  Baseline:  Goal status: INITIAL   LONG TERM GOALS: Target date: 10/16/23  Pt will maintain average 67dB over 8 minute simple conversation with rare min A in three sessions  Baseline: 08/31/23 Goal status: Modified  2.  Pt will remain 100% intelligible out of the speech room for 8- minute conversation, with rare nonverbal cues Baseline:  Goal status: Modified  3.  pt will use appropriate posture  75% of the time with rare min A in 8 minutes conversation in three sessions  Baseline:  Goal status: Modified  4.  Pt will score higher on CES than initial administration  Baseline:  Goal status: INITIAL   ASSESSMENT:  CLINICAL IMPRESSION: RECERT TODAY. Patient is a 72 y.o. male who was seen today for treatment of speech clarity who, on day of eval, demonstrated mod dysarthria and reduced breath support due to Parkinson's disease. See "today's treatment" for more details on today's session. Pt states his conversational volume is louder at home and he is receiving more compliments about his volume from his wife than prior to ST. Pt would benefit from cont'd skilled ST targeting loudness in conversation. At this time he does not endorse swallowing difficulty but SLP will cont to monitor informally and goal/s added PRN. Memory compensations targeted previous therapy course are reportedly still helpful for pt.  OBJECTIVE IMPAIRMENTS: Objective impairments include attention, memory, and dysarthria. These impairments are limiting patient from ADLs/IADLs and effectively communicating at home and in community.Factors affecting potential to achieve goals and functional outcome are previous level of function and severity of impairments.. Patient will benefit from skilled SLP services to address above impairments and improve overall function.  REHAB POTENTIAL: Good  PLAN:  SLP FREQUENCY: 2x/week  SLP DURATION: 4 weeks  PLANNED INTERVENTIONS: 92507 Treatment of speech (30 or 45 min) , Environmental controls, Cueing hierachy, Internal/external aids, Functional tasks, Multimodal communication approach, SLP instruction and feedback, Compensatory strategies, and Patient/family education    [facility], CCC-SLP 09/21/2023, 5:33 PM

## 2023-09-29 ENCOUNTER — Ambulatory Visit: Payer: Medicare Other | Attending: Neurology

## 2023-09-29 DIAGNOSIS — R471 Dysarthria and anarthria: Secondary | ICD-10-CM | POA: Diagnosis not present

## 2023-09-29 DIAGNOSIS — R41841 Cognitive communication deficit: Secondary | ICD-10-CM | POA: Diagnosis not present

## 2023-09-29 NOTE — Therapy (Signed)
 OUTPATIENT SPEECH LANGUAGE PATHOLOGY PARKINSON'S TREATMENT   Patient Name: [name] [name] MRN: [id number] DOB:09/15/1951, 73 y.o., male Today's Date: 09/29/2023  PCP: Frann Netter, MD REFERRING PROVIDER: Evonnie Stabs, DO  END OF SESSION:  End of Session - 09/29/23 1612     Visit Number 15    Number of Visits 22    Date for SLP Re-Evaluation 10/16/23    SLP Start Time 1409    SLP Stop Time  1455    SLP Time Calculation (min) 46 min    Activity Tolerance Patient tolerated treatment well                  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/27/2014   Mild neurocognitive disorder due to Parkinson's disease 01/10/2022   Non-Hodgkin lymphoma 2007   diffuse- 6 cycles of chemo with R CHOP Rituxan - last dose 10/08   Nonspecific elevation of levels of transaminase or lactic acid dehydrogenase (LDH) 07/06/2008   Parkinson's disease 07/27/2014   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   Past Surgical History:  Procedure Laterality Date   CHOLECYSTECTOMY, LAPAROSCOPIC     EYE SURGERY     x2 as child   PORT-A-CATH REMOVAL     PORTACATH PLACEMENT     SPLENECTOMY     TONSILLECTOMY     Patient Active Problem List   Diagnosis Date Noted   Mild neurocognitive disorder due to Parkinson's disease 01/10/2022   Gastroesophageal reflux disease 03/07/2020   Localized swelling of right lower extremity 04/12/2019   DOE (dyspnea on exertion) 07/15/2018   10 year risk of MI or stroke 7.5% or greater 04/12/2018   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/2015   Meige syndrome (blepharospasm with oromandibular  dystonia) 07/27/2014   Dysphagia, neurologic 07/27/2014   Parkinson's disease (HCC) 07/27/2014   Dystonia 07/10/2014   Low back pain 11/21/2011   Nonspecific elevation of levels of transaminase or lactic acid dehydrogenase (LDH) 07/06/2008   Enlarged lymph nodes 07/28/2007   Non-Hodgkin lymphoma 03/23/2007    ONSET DATE: Dx in mid-2010's; script dated 06/04/23  REFERRING DIAG:  G20.A1 (ICD-10-CM) - Parkinson's disease without dyskinesia or fluctuating manifestations (HCC)      THERAPY DIAG:  Dysarthria and anarthria  Rationale for Evaluation and Treatment: Rehabilitation  SUBJECTIVE:   SUBJECTIVE STATEMENT: Huge win - I can order at the drive-throughs again!  Pt accompanied by: self  PERTINENT HISTORY: Meige syndrome, insomnia, RBD. Pt well known to this SLP - previous ST course d/c'd in April 2024 with pt having met all LTGs.   PAIN:  Are you having pain? No  FALLS: Has patient fallen in last 6 months?  No  PATIENT GOALS: Improve with speech loudness   OBJECTIVE:  Note: Objective measures were completed at Evaluation unless otherwise noted.  DIAGNOSTIC FINDINGS:  From neuropsych evaluation report dated April 2023:  Mr. Reas completed a comprehensive neuropsychological evaluation on 01/10/2022. Please refer to that encounter for the full report and recommendations. Briefly, results suggested isolated impaired performances across complex attention and semantic fluency. Performance variability was also exhibited across all aspects of learning and memory. Regarding etiology, the most  likely culprit for ongoing cognitive weakness remains his past history of Parkinson's disease. While processing speed was improved relative to what might be expected, dysfunction surrounding complex attention and encoding/retrieval deficits of memory are quite common in this illness. The fact that motor/gait dysfunction was noted years prior to concern surrounding cognitive decline is also a  typical timeline associated with Parkinson's disease. Mild anxiety and sleep dysfunction could further influence cognitive performances. Research surrounding cognitive side effects of pramipexole  is mixed; however, I cannot rule out an ongoing side effect contribution as well.  SABRA  PATIENT REPORTED OUTCOME MEASURES (PROM): Communication Effectiveness Survey: pt scored himself 16/32 on 07/22/23, with lower scores indicating greater impact of deficit on speech effectiveness/lower QOL.  TODAY'S TREATMENT:       AB=Abdominal Breathing                                                                                                                                  DATE:  09/29/23: Last dose for PD was 1200, next dose 3:30PM.   SLP targeted loud /a/ to improve pt's habitual loudness in conversation with average 93dB, sentences (Everyday) with average 82 dB.  Pt reports s statement about being pleased with re-gaining ability to order at the drive throughs. He also is excited about now being able to successfully use his phone for conversations, compared to prior to this ST plan of care. 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 SLP suggested having him play a Bible app which he will do for his next class.  Throughout the session pt volume ranged from 62-71 dB, with pressed voice/arresting voice randomly occurring - this decr'd pt's speech volume. Average volume in 3 to 6 minute conversational segments was 66dB.  09/21/23: Last dose for PD was 1100, next dose 3:30PM. Pt stated I caught  myself when he was not speaking loud enough with family over the holiday.  SLP targeted loud /a/ to improve pt's habitual loudness in conversation with average 91dB, sentences (Everyday) with average 83 dB. No reduction in averages from previous reading two+ weeks ago.   Today SLP and pt spoke in conversational segments of 5 minutes x5, average 68dB. The last segment was 9 minutes and after 6 minutes pt loudness was impacted despite SLP cues for louder speech.   09/02/23: Last PD med was 1200 - next at 1530. She went in to the other room and closed the door. I started doing the /a/'s. I asked her a question afterwards and she told me she heard the question! SLP targeted loud /a/ to improve pt's habitual loudness in conversation with average 90dB, sentences (Everyday) with average 83 dB. In short conversatioal segments of 4 minutes x5 pt req'd rare min A for maintaining volume at  WNL levels, overall average 68dB.   08/31/23: Conversation prior to /a/ averaged 67dB. Arresting approx 40% of the time. SLP targeted loud /a/ to improve muscle memory for incr'd speech loudness and clarity. Pt produced average 91 dB with 10 reps. SLP then targeted everyday sentences and pt averaged 81dB. Speech after loud /a/ averaged 69dB for 7 minutes. Reiterated to pt to perform /a/, sentences, and linguistic homework for 10-15 minutes BID until next session.  08/24/23: Conversation prior to loud /a/ with average 71dB. Pt with vocal arresting/freezing approx 40% of the time. Loud /a/ performed today with average low 90s dB, conversation afterwards 15 minutes with rare increasing to occasional cues for full breath and loud speech - minimal difficulty with voice arrest; Average loudness 69dB. Pt possible to decr to once/week if progress cont next week.   08/19/23: Last PD meds 1100, next 1530. Loud /a/ targeted to improve muscle memory for improved loudness of speech in conversation was average 90 dB. Sentences were produced with average 82dB. 2-sentence responses resulted in average 68dB with rare min A for loudness. Vocal arresting noted rarely. Conversation between stimuli and/tasks was average 65dB with rare min A for increasing volume. Minimal vocal arresting heard.  08/17/23: Last  PD meds 12:00, next 3:30. Pt completed HEP as directed 2/4 days (once other days instead of twice). Pt entered with conversation with mod A usually necessary for increased loudness. Loud /a/ targeted to improve muscle memory to improve loudness for speech in conversation - average 92dB. Sentences produced with average 82dB. Sentence responses averaged 64dB with occasional mod cues for loudness. Cues also throughout session today for posture and full breath.   08/12/23: . SLP ascertained pt practiced twice yesterday, and once already today. Suspect this is one reason for pt's improved average. Sentences were produced with average 81dB. 2-sentence responses were average 66dB. Pt with much vocal arresting today with strained voicing. SLP told pt to cont practicing.   08/10/23: 10 minutes of conversation prior to loud /a/ was average 64dB. Pt would improve to WNL loudness but only for approx 15 seconds and then spoke below WNL until the next time he realized he was lower than WNL. Loud /a/ targeted to improve muscle memory for improved loudness of speech in conversation was average 88 dB. Sentences were produced with average 81dB. SLP targeted sentence - level material for responses as pt was not performing as well as previous session. Pt's average volume today with these responses was 66dB with more difficulty with multi-tasking (loudness and content) than previous sessions.SLP strongly encouraged pt to practice even if it is more difficult to find some alone setting/time in his house, based upon pt's performance today.   08/05/23: SLP targeted loud /a/ to improve muscle memory for incr'd speech loudness and clarity. Pt produced average 90 dB with 7 reps. SLP then targeted sentence level responses and pt averaged 82dB - cues once for breathing like with loud /a/. Pt notably takes adequate breath with loud /a/ and only approx 2/3 as much when talking.    08/03/23: HEP has been more difficult since last  session due to family matters (death in the family). SLP targeted loud /a/ to improve muscle memory for incr'd speech loudness and clarity. Pt produced average 88 dB with 5 reps. SLP then targeted sentence level responses and pt averaged 79dB - this improved to 83dB when asked to repeat. Spelling words in a category pt averaged 69dB. Telling 3 items/things in sequence (precede or follow) average  loudnesse 66dB. SLP targeted loud /a/ to improve muscle memory for incr'd speech lou of 68dB with extra time, given cognitive component. SLP gave pt homework for sentence level responses. AB noted in 70% of pt's sentence level responses.  07/29/23: In 18 minutes of conversation pt began 85% utterances with volume average 66dB. SLP targeted loud /a/ to improve muscle memory for incr'd speech loudness and clarity.  Pt produced average 91dB with 5 reps. SLP then targeted sentence level responses and pt averaged 85dB. SLP told pt to ensure he performs loud /a/, sentences, and practice louder speech with handouts for 20 minutes BID.   PATIENT EDUCATION: Education details: See today's treatment for details Person educated: Patient Education method: Explanation Education comprehension: verbalized understanding  HOME EXERCISE PROGRAM: Loud /a/ and everyday sentences, along with speech tasks matching ability level   GOALS: Goals reviewed with patient? Yes  SHORT TERM GOALS: Target date: 08/21/23  Pt will produce loud /a/ with at least low 90s dB average over three sessions  Baseline: 07/27/23, 08/17/23 Goal status: met  2.  Pt will produce 16/20 sentence responses with 69dB over two sessions  Baseline:  Goal status: not met  3.  Pt will produce volume average 68dB in 5 minutes simple conversation with rare min A over three sessions  Baseline:  Goal status: not met  4.  Pt will generate abdominal breathing 80% of the time when engaging in 5 minutes simple conversation in 2 sessions  Baseline:  Goal  status: deferred   LONG TERM GOALS: Target date: 10/16/23  Pt will maintain average 67dB over 8 minute simple conversation with rare min A in three sessions  Baseline: 08/31/23 Goal status: Modified  2.  Pt will remain 100% intelligible out of the speech room for 8- minute conversation, with rare nonverbal cues Baseline:  Goal status: Modified  3.  pt will use appropriate posture 75% of the time with rare min A in 8 minutes conversation in three sessions  Baseline:  Goal status: Modified  4.  Pt will score higher on CES than initial administration  Baseline:  Goal status: INITIAL   ASSESSMENT:  CLINICAL IMPRESSION: Patient is a 73 y.o. male who was seen today for treatment of speech clarity who, on day of eval, demonstrated mod dysarthria and reduced breath support due to Parkinson's disease. See today's treatment for more details on today's session. Pt has had an increase in successful speaking situations that were unsuccessful prior to current ST course, however remains < WNL during some ST sessions. Today, pressed speech/arresting voice hindered his overall volume. Pt would benefit from cont'd skilled ST targeting loudness in conversation. At this time he does not endorse swallowing difficulty but SLP will cont to monitor informally and goal/s added PRN. Memory compensations targeted previous therapy course are reportedly still helpful for pt.  OBJECTIVE IMPAIRMENTS: Objective impairments include attention, memory, and dysarthria. These impairments are limiting patient from ADLs/IADLs and effectively communicating at home and in community.Factors affecting potential to achieve goals and functional outcome are previous level of function and severity of impairments.. Patient will benefit from skilled SLP services to address above impairments and improve overall function.  REHAB POTENTIAL: Good  PLAN:  SLP FREQUENCY: 2x/week  SLP DURATION: 4 weeks  PLANNED INTERVENTIONS: [id number]  Treatment of speech (30 or 45 min) , Environmental controls, Cueing hierachy, Internal/external aids, Functional tasks, Multimodal communication approach, SLP instruction and feedback, Compensatory strategies, and Patient/family education    [facility], CCC-SLP 09/29/2023, 4:12 PM

## 2023-10-01 ENCOUNTER — Ambulatory Visit: Payer: Medicare Other

## 2023-10-01 DIAGNOSIS — R41841 Cognitive communication deficit: Secondary | ICD-10-CM

## 2023-10-01 DIAGNOSIS — R471 Dysarthria and anarthria: Secondary | ICD-10-CM

## 2023-10-01 NOTE — Therapy (Signed)
 OUTPATIENT SPEECH LANGUAGE PATHOLOGY PARKINSON'S TREATMENT   Patient Name: [name] [name] MRN: [id number] DOB:[birth year], 73 y.o., male Today's Date: 10/01/2023  PCP: [name] Netter, MD REFERRING PROVIDER: Evonnie Stabs, DO  END OF SESSION:  End of Session - 10/01/23 1504     Visit Number 16    Number of Visits 22    Date for SLP Re-Evaluation 10/16/23    SLP Start Time 1450    SLP Stop Time  1530    SLP Time Calculation (min) 40 min    Activity Tolerance Patient tolerated treatment well                   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   Mild neurocognitive disorder due to Parkinson's disease 01/10/2022   Non-Hodgkin lymphoma 2007   diffuse- 6 cycles of chemo with R CHOP Rituxan - last dose 10/08   Nonspecific elevation of levels of transaminase or lactic acid dehydrogenase (LDH) 07/06/2008   Parkinson's disease 07/27/2014   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   Past Surgical History:  Procedure Laterality Date   CHOLECYSTECTOMY, LAPAROSCOPIC     EYE SURGERY     x2 as child   PORT-A-CATH REMOVAL     PORTACATH PLACEMENT     SPLENECTOMY     TONSILLECTOMY     Patient Active Problem List   Diagnosis Date Noted   Mild neurocognitive disorder due to Parkinson's disease 01/10/2022   Gastroesophageal reflux disease 03/07/2020   Localized swelling of right lower extremity 04/12/2019   DOE (dyspnea on exertion) 07/15/2018   10 year risk of MI or stroke 7.5% or greater 04/12/2018   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   Meige syndrome (blepharospasm with oromandibular  dystonia) 07/27/2014   Dysphagia, neurologic 07/27/2014   Parkinson's disease (HCC) 07/27/2014   Dystonia 07/10/2014   Low back pain 11/21/2011   Nonspecific elevation of levels of transaminase or lactic acid dehydrogenase (LDH) 07/06/2008   Enlarged lymph nodes 07/28/2007   Non-Hodgkin lymphoma 03/23/2007    ONSET DATE: Dx in mid-2010's; script dated 06/04/23  REFERRING DIAG:  G20.A1 (ICD-10-CM) - Parkinson's disease without dyskinesia or fluctuating manifestations (HCC)      THERAPY DIAG:  Dysarthria and anarthria  Cognitive communication deficit  Rationale for Evaluation and Treatment: Rehabilitation  SUBJECTIVE:   SUBJECTIVE STATEMENT: Huge win - I can order at the drive-throughs again!  Pt accompanied by: self  PERTINENT HISTORY: Meige syndrome, insomnia, RBD. Pt well known to this SLP - previous ST course d/c'd in April 2024 with pt having met all LTGs.   PAIN:  Are you having pain? No  FALLS: Has patient fallen in last 6 months?  No  PATIENT GOALS: Improve with speech loudness   OBJECTIVE:  Note: Objective measures were completed at Evaluation unless otherwise noted.  DIAGNOSTIC FINDINGS:  From neuropsych evaluation report dated April 2023:  [name] [name] completed a comprehensive neuropsychological evaluation on 01/10/2022. Please refer to that encounter for the full report and recommendations. Briefly, results suggested isolated impaired performances across complex attention and semantic fluency. Performance variability was also exhibited across all aspects of learning and  memory. Regarding etiology, the most likely culprit for ongoing cognitive weakness remains his past history of Parkinson's disease. While processing speed was improved relative to what might be expected, dysfunction surrounding complex attention and encoding/retrieval deficits of memory are quite common in this illness. The fact that motor/gait dysfunction was noted years prior to concern  surrounding cognitive decline is also a typical timeline associated with Parkinson's disease. Mild anxiety and sleep dysfunction could further influence cognitive performances. Research surrounding cognitive side effects of pramipexole  is mixed; however, I cannot rule out an ongoing side effect contribution as well.  [name] [name]  PATIENT REPORTED OUTCOME MEASURES (PROM): Communication Effectiveness Survey: pt scored himself 16/32 on 07/22/23, with lower scores indicating greater impact of deficit on speech effectiveness/lower QOL.  TODAY'S TREATMENT:       AB=Abdominal Breathing                                                                                                                                  DATE:   10/01/23: Pt took last meds at 1000, next PD meds 1530 (after ST). SLP encouraged pt to take meds at 1515 because he had them on hand.  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 Pt to report back to SLP about this.  I had another win last night - pt stated nobody asked him to repeat during a virtual [employer] meeting. [name] [name] said prior to ST course he would not have had that occur. Loud /a/ targeted today to improve pt's habitual loudness in conversation with average 92dB, and sentences (Everyday) with average 81 dB. Pt was pleased with these averages today. In conversational segments of 3-7 minutes pt averaged 67 dB, and improved to 68dB with rare min A. He had very little arresting vocal quality today.   09/29/23: Last dose for PD was 1200, next dose 3:30PM.   SLP targeted loud  Pt reports s statement about being pleased with re-gaining ability to order at the drive throughs. He also is excited about now being able to successfully use his phone for conversations, compared to prior to this ST plan of care. 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 SLP suggested having him play a Bible app which he will do for his next class.  Throughout the session pt volume ranged from 62-71 dB, with pressed voice/arresting voice randomly occurring - this decr'd pt's speech volume. Average volume in 3 to 6 minute conversational segments was 66dB.  09/21/23: Last dose for PD was 1100, next dose 3:30PM. Pt stated I caught  myself when he was not speaking loud enough with family over the holiday.  SLP targeted loud /a/ to improve  pt's habitual loudness in conversation with average 91dB, sentences (Everyday) with average 83 dB. No reduction in averages from previous reading two+ weeks ago.  Today SLP and pt spoke in conversational segments of 5 minutes x5, average 68dB. The last segment was 9 minutes and after 6 minutes pt loudness was impacted despite SLP cues for louder speech.   09/02/23: Last PD med was 1200 - next at 1530. She went in to the other room and closed the door. I started doing the /a/'s. I asked her a question afterwards and she told me she heard the question! SLP targeted loud /a/ to improve pt's habitual loudness in conversation with average 90dB, sentences (Everyday) with average 83 dB. In short conversatioal segments of 4 minutes x5 pt req'd rare min A for maintaining volume at WNL levels, overall average 68dB.   08/31/23: Conversation prior to /a/ averaged 67dB. Arresting approx 40% of the time. SLP targeted loud /a/ to improve muscle memory for incr'd speech loudness and clarity. Pt produced average 91 dB with 10 reps. SLP then targeted everyday sentences and pt averaged 81dB. Speech after loud /a/ averaged 69dB for 7 minutes. Reiterated to pt to perform /a/, sentences, and linguistic homework for 10-15 minutes BID until next session.  08/24/23: Conversation prior to loud /a/ with average 71dB. Pt with vocal arresting/freezing approx 40% of the time.  Loud /a/ performed today with average low 90s dB, conversation afterwards 15 minutes with rare increasing to occasional cues for full breath and loud speech - minimal difficulty with voice arrest; Average loudness 69dB. Pt possible to decr to once/week if progress cont next week.   08/19/23: Last PD meds 1100, next 1530. Loud /a/ targeted to improve muscle memory for improved loudness of speech in conversation was average 90 dB. Sentences were produced with average 82dB. 2-sentence responses resulted in average 68dB with rare min A for loudness. Vocal arresting noted rarely. Conversation between stimuli and/tasks was average 65dB with rare min A for increasing volume. Minimal vocal arresting heard.  08/17/23: Last PD meds 12:00, next 3:30. Pt completed HEP as directed 2/4 days (once other days instead of twice). Pt entered with conversation with mod A usually necessary for increased loudness. Loud /a/ targeted to improve muscle memory to improve loudness for speech in conversation - average 92dB. Sentences produced with average 82dB. Sentence responses averaged 64dB with occasional mod cues for loudness. Cues also throughout session today for posture and full breath.   08/12/23: . SLP ascertained pt practiced twice yesterday, and once already today. Suspect this is one reason for pt's improved average. Sentences were produced with average 81dB. 2-sentence responses were average 66dB. Pt with much vocal arresting today with strained voicing. SLP told pt to cont practicing.   08/10/23: 10 minutes of conversation prior to loud /a/ was average 64dB. Pt would improve to WNL loudness but only for approx 15 seconds and then spoke below WNL until the next time he realized he was lower than WNL. Loud /a/ targeted to improve muscle memory for improved loudness of speech in conversation was average 88 dB. Sentences were produced with average 81dB. SLP targeted sentence - level material for responses as pt was not  performing as well as previous session. Pt's average volume today with these responses was 66dB with more difficulty with multi-tasking (loudness and content) than previous sessions.SLP strongly encouraged pt to practice even if it is more difficult to find some alone setting/time in his house, based upon pt's performance today.  08/05/23: SLP targeted loud /a/ to improve muscle memory for incr'd speech loudness and clarity. Pt produced average 90 dB with 7 reps. SLP then targeted sentence level responses and pt averaged 82dB - cues once for breathing like with loud /a/. Pt notably takes adequate breath with loud /a/ and only approx 2/3 as much when talking.    08/03/23: HEP has been more difficult since last session due to family matters (death in the family). SLP targeted loud /a/ to improve muscle memory for incr'd speech loudness and clarity. Pt produced average 88 dB with 5 reps. SLP then targeted sentence level responses and pt averaged 79dB - this improved to 83dB when asked to repeat. Spelling words in a category pt averaged 69dB. Telling 3 items/things in sequence (precede or follow) average loudnesse 66dB. SLP targeted loud /a/ to improve muscle memory for incr'd speech lou of 68dB with extra time, given cognitive component. SLP gave pt homework for sentence level responses. AB noted in 70% of pt's sentence level responses.  07/29/23: In 18 minutes of conversation pt began 85% utterances with volume average 66dB. SLP targeted loud /a/ to improve muscle memory for incr'd speech loudness and clarity.  Pt produced average 91dB with 5 reps. SLP then targeted sentence level responses and pt averaged 85dB. SLP told pt to ensure he performs loud /a/, sentences, and practice louder speech with handouts for 20 minutes BID.   PATIENT EDUCATION: Education details: See today's treatment for details Person educated: Patient Education method: Explanation Education comprehension: verbalized  understanding  HOME EXERCISE PROGRAM: Loud /a/ and everyday sentences, along with speech tasks matching ability level   GOALS: Goals reviewed with patient? Yes  SHORT TERM GOALS: Target date: 08/21/23  Pt will produce loud /a/ with at least low 90s dB average over three sessions  Baseline: 07/27/23, 08/17/23 Goal status: met  2.  Pt will produce 16/20 sentence responses with 69dB over two sessions  Baseline:  Goal status: not met  3.  Pt will produce volume average 68dB in 5 minutes simple conversation with rare min A over three sessions  Baseline:  Goal status: not met  4.  Pt will generate abdominal breathing 80% of the time when engaging in 5 minutes simple conversation in 2 sessions  Baseline:  Goal status: deferred   LONG TERM GOALS: Target date: 10/16/23  Pt will maintain average 67dB over 8 minute simple conversation with rare min A in three sessions  Baseline: 08/31/23, 10/01/23 Goal status: Modified  2.  Pt will remain 100% intelligible out of the speech room for 8- minute conversation, with rare nonverbal cues Baseline:  Goal status: Modified  3.  pt will use appropriate posture 75% of the time with rare min A in 8 minutes conversation in three sessions  Baseline:  Goal status: Modified  4.  Pt will score higher on CES than initial administration  Baseline:  Goal status: INITIAL   ASSESSMENT:  CLINICAL IMPRESSION: Patient is a 73 y.o. male who was seen today for treatment of speech clarity who, on day of eval, demonstrated mod dysarthria and reduced breath support due to Parkinson's disease. See today's treatment for more details on today's session. Pt has had an increase in successful speaking situations that were unsuccessful prior to current ST course, however remains < WNL during some ST sessions. Today, pressed speech/arresting voice hindered his overall volume. Pt would benefit from cont'd skilled ST targeting loudness in conversation. At this time he  does not endorse swallowing  difficulty but SLP will cont to monitor informally and goal/s added PRN. Memory compensations targeted previous therapy course are reportedly still helpful for pt.  OBJECTIVE IMPAIRMENTS: Objective impairments include attention, memory, and dysarthria. These impairments are limiting patient from ADLs/IADLs and effectively communicating at home and in community.Factors affecting potential to achieve goals and functional outcome are previous level of function and severity of impairments.. Patient will benefit from skilled SLP services to address above impairments and improve overall function.  REHAB POTENTIAL: Good  PLAN:  SLP FREQUENCY: 2x/week  SLP DURATION: 4 weeks  PLANNED INTERVENTIONS: 92507 Treatment of speech (30 or 45 min) , Environmental controls, Cueing hierachy, Internal/external aids, Functional tasks, Multimodal communication approach, SLP instruction and feedback, Compensatory strategies, and Patient/family education    [facility], CCC-SLP 10/01/2023, 3:05 PM

## 2023-10-06 ENCOUNTER — Ambulatory Visit: Payer: Medicare Other

## 2023-10-06 DIAGNOSIS — R471 Dysarthria and anarthria: Secondary | ICD-10-CM

## 2023-10-06 DIAGNOSIS — R41841 Cognitive communication deficit: Secondary | ICD-10-CM

## 2023-10-06 NOTE — Therapy (Signed)
 OUTPATIENT SPEECH LANGUAGE PATHOLOGY PARKINSON'S TREATMENT   Patient Name: [name] [name] MRN: [id number] DOB:1950-12-27, 73 y.o., male Today's Date: 10/06/2023  PCP: [name] Netter, MD REFERRING PROVIDER: Evonnie Stabs, DO  END OF SESSION:  End of Session - 10/06/23 1410     Visit Number 17    Number of Visits 22    Date for SLP Re-Evaluation 10/16/23    SLP Start Time 1405    SLP Stop Time  1445    SLP Time Calculation (min) 40 min    Activity Tolerance Patient tolerated treatment well                   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   Mild neurocognitive disorder due to Parkinson's disease 01/10/2022   Non-Hodgkin lymphoma 2007   diffuse- 6 cycles of chemo with R CHOP Rituxan - last dose 10/08   Nonspecific elevation of levels of transaminase or lactic acid dehydrogenase (LDH) 07/06/2008   Parkinson's disease 07/27/2014   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   Past Surgical History:  Procedure Laterality Date   CHOLECYSTECTOMY, LAPAROSCOPIC     EYE SURGERY     x2 as child   PORT-A-CATH REMOVAL     PORTACATH PLACEMENT     SPLENECTOMY     TONSILLECTOMY     Patient Active Problem List   Diagnosis Date Noted   Mild neurocognitive disorder due to Parkinson's disease 01/10/2022   Gastroesophageal reflux disease 03/07/2020   Localized swelling of right lower extremity 04/12/2019   DOE (dyspnea on exertion) 07/15/2018   10 year risk of MI or stroke 7.5% or greater 04/12/2018   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/2015   Meige syndrome (blepharospasm with oromandibular  dystonia) 07/27/2014   Dysphagia, neurologic 07/27/2014   Parkinson's disease (HCC) 07/27/2014   Dystonia 07/10/2014   Low back pain 11/21/2011   Nonspecific elevation of levels of transaminase or lactic acid dehydrogenase (LDH) 07/06/2008   Enlarged lymph nodes 07/28/2007   Non-Hodgkin lymphoma 03/23/2007    ONSET DATE: Dx in mid-2010's; script dated 06/04/23  REFERRING DIAG:  G20.A1 (ICD-10-CM) - Parkinson's disease without dyskinesia or fluctuating manifestations (HCC)      THERAPY DIAG:  Dysarthria and anarthria  Cognitive communication deficit  Rationale for Evaluation and Treatment: Rehabilitation  SUBJECTIVE:   SUBJECTIVE STATEMENT: I had a phone call yesterday with a fella I haven't heard from in a while and he didn't once ask me to repeat.  Pt accompanied by: self  PERTINENT HISTORY: Meige syndrome, insomnia, RBD. Pt well known to this SLP - previous ST course d/c'd in April 2024 with pt having met all LTGs.   PAIN:  Are you having pain? No  FALLS: Has patient fallen in last 6 months?  No  PATIENT GOALS: Improve with speech loudness   OBJECTIVE:  Note: Objective measures were completed at Evaluation unless otherwise noted.  DIAGNOSTIC FINDINGS:  From neuropsych evaluation report dated April 2023:  [name] [name] completed a comprehensive neuropsychological evaluation on 01/10/2022. Please refer to that encounter for the full report and recommendations. Briefly, results suggested isolated impaired performances across complex attention  and semantic fluency. Performance variability was also exhibited across all aspects of learning and memory. Regarding etiology, the most likely culprit for ongoing cognitive weakness remains his past history of Parkinson's disease. While processing speed was improved relative to what might be expected, dysfunction surrounding complex attention and encoding/retrieval deficits of memory are quite common in this illness. The fact that  motor/gait dysfunction was noted years prior to concern surrounding cognitive decline is also a typical timeline associated with Parkinson's disease. Mild anxiety and sleep dysfunction could further influence cognitive performances. Research surrounding cognitive side effects of pramipexole  is mixed; however, I cannot rule out an ongoing side effect contribution as well.  SABRA  PATIENT REPORTED OUTCOME MEASURES (PROM): Communication Effectiveness Survey: pt scored himself 16/32 on 07/22/23, with lower scores indicating greater impact of deficit on speech effectiveness/lower QOL.  TODAY'S TREATMENT:       AB=Abdominal Breathing                                                                                                                                  DATE:   10/05/23: [name] [name] reports wife cont to ask him rarely for repeats. Pt took last meds at 1100, next PD meds 1530. Loud /a/ targeted today to improve pt's habitual loudness in conversation with average 94dB, and sentences (Everyday) with average 80 dB. In multiple sentence responses, pt average loudness was 67dB with SLP occasional min A for intent and loudness. SLP encouraged pt to cont with speaking with intent and purpose. Pt drank water and soda during today's session without any overt s/sx swallowing deficits.  10/01/23: Pt took last meds at 1000, next PD meds 1530 (after ST). SLP encouraged pt to take meds at 1515 because he had them on hand.  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 Pt to report back to SLP about this.  I had another win last night - pt stated nobody asked him to repeat during a virtual [employer] meeting. [name] [name] said prior to ST course he would not have had that occur. Loud /a/ targeted today to improve pt's habitual loudness in conversation with average 92dB, and sentences (Everyday) with average 81 dB. Pt was pleased with these averages today. In  conversational segments of 3-7 minutes pt averaged 67 dB, and improved to 68dB with rare min A. He had very little arresting vocal quality today.   09/29/23: Last dose for PD was 1200, next dose 3:30PM.   SLP targeted loud  Pt reports s statement about being pleased with re-gaining ability to order at the drive throughs. He also is excited about now being able to successfully use his phone for conversations, compared to prior to this ST plan of care. 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 SLP suggested having him play a Bible app which he will do for his next class.  Throughout the session pt volume ranged from 62-71 dB, with pressed voice/arresting voice randomly occurring - this decr'd pt's speech volume. Average volume in 3 to 6 minute conversational segments was 66dB.  09/21/23: Last dose for PD was 1100, next dose 3:30PM. Pt stated I caught  myself when he was not speaking loud enough with family over the holiday.  SLP targeted loud /a/ to improve pt's habitual loudness in conversation with average 91dB, sentences (Everyday) with average 83 dB. No reduction in averages from previous reading two+ weeks ago.  Today SLP and pt spoke in conversational segments of 5 minutes x5, average 68dB. The last segment was 9 minutes and after 6 minutes pt loudness was impacted despite SLP cues for louder speech.   09/02/23: Last PD med was 1200 - next at 1530. She went in to the other room and closed the door. I started doing the /a/'s. I asked her a question afterwards and she told me she heard the question! SLP targeted loud /a/ to improve pt's habitual loudness in conversation with average 90dB, sentences (Everyday) with average 83 dB. In short conversatioal segments of 4 minutes x5 pt req'd rare min A for maintaining volume at WNL levels,  overall average 68dB.   08/31/23: Conversation prior to /a/ averaged 67dB. Arresting approx 40% of the time. SLP targeted loud /a/ to improve muscle memory for incr'd speech loudness and clarity. Pt produced average 91 dB with 10 reps. SLP then targeted everyday sentences and pt averaged 81dB. Speech after loud /a/ averaged 69dB for 7 minutes. Reiterated to pt to perform /a/, sentences, and linguistic homework for 10-15 minutes BID until next session.  08/24/23: Conversation prior to loud /a/ with average 71dB. Pt with vocal arresting/freezing approx 40% of the time. Loud /a/ performed today with average low 90s dB, conversation afterwards 15 minutes with rare increasing to occasional cues for full breath and loud speech - minimal difficulty with voice arrest; Average loudness 69dB. Pt possible to decr to once/week if progress cont next week.   08/19/23: Last PD meds 1100, next 1530. Loud /a/ targeted to improve muscle memory for improved loudness of speech in conversation was average 90 dB. Sentences were produced with average 82dB. 2-sentence responses resulted in average 68dB with rare min A for loudness. Vocal arresting noted rarely. Conversation between stimuli and/tasks was average 65dB with rare min A for increasing volume. Minimal vocal arresting heard.  08/17/23: Last PD meds 12:00, next 3:30. Pt completed HEP as directed 2/4 days (once other days instead of twice). Pt entered with conversation with mod A usually necessary for increased loudness. Loud /a/ targeted to improve muscle memory to improve loudness for speech in conversation - average 92dB. Sentences produced with average 82dB. Sentence responses averaged 64dB with occasional mod cues for loudness. Cues also throughout session today for posture and full breath.   08/12/23: . SLP ascertained pt practiced twice yesterday, and once already today. Suspect this is one reason for pt's improved average. Sentences were produced with  average 81dB. 2-sentence responses were average 66dB. Pt with much vocal arresting today with strained voicing. SLP told pt to cont practicing.   08/10/23: 10 minutes of conversation prior to loud /a/ was average 64dB. Pt would improve to WNL loudness but only for approx 15 seconds and then  spoke below WNL until the next time he realized he was lower than WNL. Loud /a/ targeted to improve muscle memory for improved loudness of speech in conversation was average 88 dB. Sentences were produced with average 81dB. SLP targeted sentence - level material for responses as pt was not performing as well as previous session. Pt's average volume today with these responses was 66dB with more difficulty with multi-tasking (loudness and content) than previous sessions.SLP strongly encouraged pt to practice even if it is more difficult to find some alone setting/time in his house, based upon pt's performance today.   08/05/23: SLP targeted loud /a/ to improve muscle memory for incr'd speech loudness and clarity. Pt produced average 90 dB with 7 reps. SLP then targeted sentence level responses and pt averaged 82dB - cues once for breathing like with loud /a/. Pt notably takes adequate breath with loud /a/ and only approx 2/3 as much when talking.    08/03/23: HEP has been more difficult since last session due to family matters (death in the family). SLP targeted loud /a/ to improve muscle memory for incr'd speech loudness and clarity. Pt produced average 88 dB with 5 reps. SLP then targeted sentence level responses and pt averaged 79dB - this improved to 83dB when asked to repeat. Spelling words in a category pt averaged 69dB. Telling 3 items/things in sequence (precede or follow) average loudnesse 66dB. SLP targeted loud /a/ to improve muscle memory for incr'd speech lou of 68dB with extra time, given cognitive component. SLP gave pt homework for sentence level responses. AB noted in 70% of pt's sentence level  responses.  07/29/23: In 18 minutes of conversation pt began 85% utterances with volume average 66dB. SLP targeted loud /a/ to improve muscle memory for incr'd speech loudness and clarity.  Pt produced average 91dB with 5 reps. SLP then targeted sentence level responses and pt averaged 85dB. SLP told pt to ensure he performs loud /a/, sentences, and practice louder speech with handouts for 20 minutes BID.   PATIENT EDUCATION: Education details: See today's treatment for details Person educated: Patient Education method: Explanation Education comprehension: verbalized understanding  HOME EXERCISE PROGRAM: Loud /a/ and everyday sentences, along with speech tasks matching ability level   GOALS: Goals reviewed with patient? Yes  SHORT TERM GOALS: Target date: 08/21/23  Pt will produce loud /a/ with at least low 90s dB average over three sessions  Baseline: 07/27/23, 08/17/23 Goal status: met  2.  Pt will produce 16/20 sentence responses with 69dB over two sessions  Baseline:  Goal status: not met  3.  Pt will produce volume average 68dB in 5 minutes simple conversation with rare min A over three sessions  Baseline:  Goal status: not met  4.  Pt will generate abdominal breathing 80% of the time when engaging in 5 minutes simple conversation in 2 sessions  Baseline:  Goal status: deferred   LONG TERM GOALS: Target date: 10/16/23  Pt will maintain average 67dB over 8 minute simple conversation with rare min A in three sessions  Baseline: 08/31/23, 10/01/23 Goal status: Modified  2.  Pt will remain 100% intelligible out of the speech room for 8- minute conversation, with rare nonverbal cues Baseline:  Goal status: Modified  3.  pt will use appropriate posture 75% of the time with rare min A in 8 minutes conversation in three sessions  Baseline:  Goal status: Modified  4.  Pt will score higher on CES than initial administration  Baseline:  Goal status:  INITIAL   ASSESSMENT:  CLINICAL IMPRESSION: Patient is a 73 y.o. male who was seen today for treatment of speech clarity who, on day of eval, demonstrated mod dysarthria and reduced breath support due to Parkinson's disease. See today's treatment for more details on today's session. Pt continues to report communication successes from session to session, however he cont to remain < WNL volume during most ST sessions. Pt would benefit from cont'd skilled ST targeting loudness in conversation. At this time he does not endorse swallowing difficulty but SLP will cont to monitor informally and goal/s added PRN. Memory compensations targeted previous therapy course are reportedly still helpful for pt.  OBJECTIVE IMPAIRMENTS: Objective impairments include attention, memory, and dysarthria. These impairments are limiting patient from ADLs/IADLs and effectively communicating at home and in community.Factors affecting potential to achieve goals and functional outcome are previous level of function and severity of impairments.. Patient will benefit from skilled SLP services to address above impairments and improve overall function.  REHAB POTENTIAL: Good  PLAN:  SLP FREQUENCY: 2x/week  SLP DURATION: 4 weeks  PLANNED INTERVENTIONS: 92507 Treatment of speech (30 or 45 min) , Environmental controls, Cueing hierachy, Internal/external aids, Functional tasks, Multimodal communication approach, SLP instruction and feedback, Compensatory strategies, and Patient/family education    [facility], CCC-SLP 10/06/2023, 2:10 PM

## 2023-10-09 ENCOUNTER — Ambulatory Visit: Payer: Medicare Other

## 2023-10-09 DIAGNOSIS — R471 Dysarthria and anarthria: Secondary | ICD-10-CM

## 2023-10-09 DIAGNOSIS — R41841 Cognitive communication deficit: Secondary | ICD-10-CM

## 2023-10-09 NOTE — Therapy (Signed)
OUTPATIENT SPEECH LANGUAGE PATHOLOGY PARKINSON'S TREATMENT   Patient Name: [name] [name] MRN: [id number] DOB:Dec 12, 1950, 73 y.o., male Today's Date: 10/09/2023  PCP: Arva Chafe, MD REFERRING PROVIDER: Kerin Salen, DO  END OF SESSION:  End of Session - 10/09/23 1103     Visit Number 18    Number of Visits 22    Date for SLP Re-Evaluation 10/16/23    SLP Start Time 1022    SLP Stop Time  1100    SLP Time Calculation (min) 38 min    Activity Tolerance Patient tolerated treatment well                    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/27/2014   Mild neurocognitive disorder due to Parkinson's disease 01/10/2022   Non-Hodgkin lymphoma 2007   diffuse- 6 cycles of chemo with R CHOP Rituxan - last dose 10/08   Nonspecific elevation of levels of transaminase or lactic acid dehydrogenase (LDH) 07/06/2008   Parkinson's disease 07/27/2014   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/02/2015   Past Surgical History:  Procedure Laterality Date   CHOLECYSTECTOMY, LAPAROSCOPIC     EYE SURGERY     x2 as child   PORT-A-CATH REMOVAL     PORTACATH PLACEMENT     SPLENECTOMY     TONSILLECTOMY     Patient Active Problem List   Diagnosis Date Noted   Mild neurocognitive disorder due to Parkinson's disease 01/10/2022   Gastroesophageal reflux disease 03/07/2020   Localized swelling of right lower extremity 04/12/2019   DOE (dyspnea on exertion) 07/15/2018   10 year risk of MI or stroke 7.5% or greater 04/12/2018   Post-splenectomy 04/02/2015   Meige syndrome (blepharospasm with  oromandibular dystonia) 07/27/2014   Dysphagia, neurologic 07/27/2014   Parkinson's disease (HCC) 07/27/2014   Dystonia 07/10/2014   Low back pain 11/21/2011   Nonspecific elevation of levels of transaminase or lactic acid dehydrogenase (LDH) 07/06/2008   Enlarged lymph nodes 07/28/2007   Non-Hodgkin lymphoma 03/23/2007    ONSET DATE: Dx in mid-2010's; script dated 06/04/23  REFERRING DIAG:  G20.A1 (ICD-10-CM) - Parkinson's disease without dyskinesia or fluctuating manifestations (HCC)      THERAPY DIAG:  Dysarthria and anarthria  Cognitive communication deficit  Rationale for Evaluation and Treatment: Rehabilitation  SUBJECTIVE:   SUBJECTIVE STATEMENT: "Yesterday we took our son to [place], It was packed. I noticed when I was slouching and sat up and then was louder."  Pt accompanied by: self  PERTINENT HISTORY: Meige syndrome, insomnia, RBD. Pt well known to this SLP - previous ST course d/c'd in April 2024 with pt having met all LTGs.   PAIN:  Are you having pain? No  FALLS: Has patient fallen in last 6 months?  No  PATIENT GOALS: Improve with speech loudness   OBJECTIVE:  Note: Objective measures were completed at Evaluation unless otherwise noted.  DIAGNOSTIC FINDINGS:  From neuropsych evaluation report dated April 2023:  "Mr. Mcmanaway completed a comprehensive neuropsychological evaluation on 01/10/2022. Please refer to that encounter for the full report and recommendations. Briefly, results suggested isolated impaired performances across complex attention  and semantic fluency. Performance variability was also exhibited across all aspects of learning and memory. Regarding etiology, the most likely culprit for ongoing cognitive weakness remains his past history of Parkinson's disease. While processing speed was improved relative to what might be expected, dysfunction surrounding complex attention and encoding/retrieval deficits of memory are quite common in this  illness. The fact that motor/gait dysfunction was noted years prior to concern surrounding cognitive decline is also a typical timeline associated with Parkinson's disease. Mild anxiety and sleep dysfunction could further influence cognitive performances. Research surrounding cognitive side effects of pramipexole is mixed; however, I cannot rule out an ongoing side effect contribution as well."  .  PATIENT REPORTED OUTCOME MEASURES (PROM): Communication Effectiveness Survey: pt scored himself 16/32 on 07/22/23, with lower scores indicating greater impact of deficit on speech effectiveness/lower QOL.  TODAY'S TREATMENT:       AB=Abdominal Breathing                                                                                                                                  DATE:   10/09/23: 0700 AM was last meds, 1100 next meds (30 minutes from now). Loud /a/ targeted today to improve pt's habitual loudness in conversation with average 93dB, and sentences (Everyday) with average 81 dB. In the lobby, pt maintained volume at 68dB with rare min A for loudness. "Arresting" quality approx 30% today, which decr'd volume. Pt cont to have positive communication interactions.   10/05/23: [name] reports wife cont to ask him rarely for repeats. Pt took last meds at 1100, next PD meds 1530. Loud /a/ targeted today to improve pt's habitual loudness in conversation with average 94dB, and sentences (Everyday) with average 80 dB. In multiple sentence responses, pt average loudness was 67dB with SLP occasional min A for intent and loudness. SLP encouraged pt to cont with speaking with intent and purpose. Pt drank water and soda during today's session without any overt s/sx swallowing deficits.  10/01/23: Pt took last meds at 1000, next PD meds 1530 (after ST). SLP encouraged pt to take meds at 1515 because he had them on hand.  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 Pt to report back to SLP about this.  "I had another win last night" - pt stated nobody asked him to repeat during a virtual [employer] meeting. [name] said prior to ST course he would not have had that occur. Loud /a/ targeted today to improve pt's habitual loudness in conversation with average 92dB, and sentences (Everyday) with average 81 dB. Pt was pleased with these averages today. In conversational segments of 3-7 minutes pt averaged 67 dB, and improved to 68dB with rare min A. He had very little "arresting" vocal quality today.   09/29/23: Last dose for PD was 1200, next dose 3:30PM.   SLP targeted  loud  Pt reports "s" statement about being pleased with re-gaining ability to order at the drive throughs. He also is excited about now being able to successfully use his phone for conversations, compared to prior to this ST plan of care. 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 SLP suggested having him play a Bible app which he will do for his next class.  Throughout the session pt volume ranged from 62-71 dB, with pressed voice/arresting voice randomly occurring - this decr'd pt's speech volume. Average volume in 3 to 6 minute conversational segments was 66dB.  09/21/23: Last dose for PD was 1100, next dose 3:30PM. Pt stated "I caught  myself" when he was not speaking loud enough with family over the holiday.  SLP targeted loud /a/ to improve pt's habitual loudness in conversation with average 91dB, sentences (Everyday) with average 83 dB. No reduction in averages from previous reading two+ weeks ago.  Today SLP and pt spoke in conversational segments of 5 minutes x5, average 68dB. The last segment was 9 minutes and after 6 minutes pt loudness was impacted despite SLP cues for louder speech.   09/02/23: Last PD med was  1200 - next at 1530. "She went in to the other room and closed the door. I started doing the /a/'s. I asked her a question afterwards and she told me she heard the question!" SLP targeted loud /a/ to improve pt's habitual loudness in conversation with average 90dB, sentences (Everyday) with average 83 dB. In short conversatioal segments of 4 minutes x5 pt req'd rare min A for maintaining volume at WNL levels, overall average 68dB.   08/31/23: Conversation prior to /a/ averaged 67dB. "Arresting" approx 40% of the time. SLP targeted loud /a/ to improve muscle memory for incr'd speech loudness and clarity. Pt produced average 91 dB with 10 reps. SLP then targeted everyday sentences and pt averaged 81dB. Speech after loud /a/ averaged 69dB for 7 minutes. Reiterated to pt to perform /a/, sentences, and linguistic homework for 10-15 minutes BID until next session.  08/24/23: Conversation prior to loud /a/ with average 71dB. Pt with vocal "arresting"/"freezing" approx 40% of the time. Loud /a/ performed today with average low 90s dB, conversation afterwards 15 minutes with rare increasing to occasional cues for full breath and loud speech - minimal difficulty with voice "arrest"; Average loudness 69dB. Pt possible to decr to once/week if progress cont next week.   08/19/23: Last PD meds 1100, next 1530. Loud /a/ targeted to improve muscle memory for improved loudness of speech in conversation was average 90 dB. Sentences were produced with average 82dB. 2-sentence responses resulted in average 68dB with rare min A for loudness. Vocal arresting noted rarely. Conversation between stimuli and/tasks was average 65dB with rare min A for increasing volume. Minimal vocal arresting heard.  08/17/23: Last PD meds 12:00, next 3:30. Pt completed HEP as directed 2/4 days (once other days instead of twice). Pt entered with conversation with mod A usually necessary for increased loudness. Loud /a/ targeted to improve muscle memory  to improve loudness for speech in conversation - average 92dB. Sentences produced with average 82dB. Sentence responses averaged 64dB with occasional mod cues for loudness. Cues also throughout session today for posture and full breath.   08/12/23: . SLP ascertained pt practiced twice yesterday, and once  already today. Suspect this is one reason for pt's improved average. Sentences were produced with average 81dB. 2-sentence responses were average 66dB. Pt with much vocal "arresting" today with strained voicing. SLP told pt to cont practicing.   08/10/23: 10 minutes of conversation prior to loud /a/ was average 64dB. Pt would improve to WNL loudness but only for approx 15 seconds and then spoke below WNL until the next time he realized he was lower than WNL. Loud /a/ targeted to improve muscle memory for improved loudness of speech in conversation was average 88 dB. Sentences were produced with average 81dB. SLP targeted sentence - level material for responses as pt was not performing as well as previous session. Pt's average volume today with these responses was 66dB with more difficulty with multi-tasking (loudness and content) than previous sessions.SLP strongly encouraged pt to practice even if it is more difficult to find some alone setting/time in his house, based upon pt's performance today.   08/05/23: SLP targeted loud /a/ to improve muscle memory for incr'd speech loudness and clarity. Pt produced average 90 dB with 7 reps. SLP then targeted sentence level responses and pt averaged 82dB - cues once for breathing like with loud /a/. Pt notably takes adequate breath with loud /a/ and only approx 2/3 as much when talking.    08/03/23: HEP has been more difficult since last session due to family matters (death in the family). SLP targeted loud /a/ to improve muscle memory for incr'd speech loudness and clarity. Pt produced average 88 dB with 5 reps. SLP then targeted sentence level responses and pt  averaged 79dB - this improved to 83dB when asked to repeat. Spelling words in a category pt averaged 69dB. Telling 3 items/things in sequence (precede or follow) average loudnesse 66dB. SLP targeted loud /a/ to improve muscle memory for incr'd speech lou of 68dB with extra time, given cognitive component. SLP gave pt homework for sentence level responses. AB noted in 70% of pt's sentence level responses.  07/29/23: In 18 minutes of conversation pt began 85% utterances with volume average 66dB. SLP targeted loud /a/ to improve muscle memory for incr'd speech loudness and clarity.  Pt produced average 91dB with 5 reps. SLP then targeted sentence level responses and pt averaged 85dB. SLP told pt to ensure he performs loud /a/, sentences, and practice louder speech with handouts for 20 minutes BID.   PATIENT EDUCATION: Education details: See "today's treatment" for details Person educated: Patient Education method: Explanation Education comprehension: verbalized understanding  HOME EXERCISE PROGRAM: Loud /a/ and everyday sentences, along with speech tasks matching ability level   GOALS: Goals reviewed with patient? Yes  SHORT TERM GOALS: Target date: 08/21/23  Pt will produce loud /a/ with at least low 90s dB average over three sessions  Baseline: 07/27/23, 08/17/23 Goal status: met  2.  Pt will produce 16/20 sentence responses with 69dB over two sessions  Baseline:  Goal status: not met  3.  Pt will produce volume average 68dB in 5 minutes simple conversation with rare min A over three sessions  Baseline:  Goal status: not met  4.  Pt will generate abdominal breathing 80% of the time when engaging in 5 minutes simple conversation in 2 sessions  Baseline:  Goal status: deferred   LONG TERM GOALS: Target date: 10/16/23  Pt will maintain average 67dB over 8 minute simple conversation with rare min A in three sessions  Baseline: 08/31/23, 10/01/23, 10/09/23 Goal status: Met  2.  Pt  will  remain 100% intelligible out of the speech room for 8- minute conversation, with rare nonverbal cues Baseline:  Goal status: Met  3.  pt will use appropriate posture 75% of the time with rare min A in 8 minutes conversation in three sessions  Baseline: 10/09/23 Goal status: Modified  4.  Pt will score higher on CES than initial administration  Baseline:  Goal status: INITIAL   ASSESSMENT:  CLINICAL IMPRESSION: Patient is a 73 y.o. male who was seen today for treatment of speech clarity who, on day of eval, demonstrated mod dysarthria and reduced breath support due to Parkinson's disease. See "today's treatment" for more details on today's session. Pt continues to report communication successes from session to session, however he cont to remain < WNL volume during most ST sessions. Pt would benefit from cont'd skilled ST targeting loudness in conversation. At this time he does not endorse swallowing difficulty but SLP will cont to monitor informally and goal/s added PRN. Memory compensations targeted previous therapy course are reportedly still helpful for pt.  OBJECTIVE IMPAIRMENTS: Objective impairments include attention, memory, and dysarthria. These impairments are limiting patient from ADLs/IADLs and effectively communicating at home and in community.Factors affecting potential to achieve goals and functional outcome are previous level of function and severity of impairments.. Patient will benefit from skilled SLP services to address above impairments and improve overall function.  REHAB POTENTIAL: Good  PLAN:  SLP FREQUENCY: 2x/week  SLP DURATION: 4 weeks  PLANNED INTERVENTIONS: 92507 Treatment of speech (30 or 45 min) , Environmental controls, Cueing hierachy, Internal/external aids, Functional tasks, Multimodal communication approach, SLP instruction and feedback, Compensatory strategies, and Patient/family education    [facility], CCC-SLP 10/09/2023, 11:04 AM

## 2023-10-09 NOTE — Progress Notes (Unsigned)
Assessment/Plan:   1.  Parkinsons Disease  -continue carbidopa/levodopa 25/100, 1.5 tablet at 7 AM/11 AM/ and 1 tablet at 3 PM/7 PM  - continue carbidopa/levodopa 50/200 CR q hs  -Continue pramipexole, 0.75 mg 3 times per day  -use cane at all times but discussed that walker will help with freezing more than cane  -needs to exercise, as discussed at all visits.  -We discussed Vyalev, which is foscarbidopa/foslevodopa pump that is newly FDA approved.  We discussed that it is for motor fluctuations in adults with advanced Parkinson's disease.  We discussed that this likely will not be on Medicare formulary until the latter half of 2025.  We discussed risks and benefits of this drug.    2.  Meige syndrome  -Stable  -no tx needed right now  3.  Insomnia/mild RBD  -Continue clonazepam, 0.5 mg, 1/2 tablet at night.  Not taking completely faithfully as 90 day supply lasting 120 day.  Using more faithfully may help to get better rest and move sleep time from 2am to closer to 11pm-midnight.   Higher dosages didn't help and had hang over effect  -Continue remeron, 15 mg q hs.     -discussed sleep hygiene.  His is not good.  Also, discussed screen time and putitng down ipad at least 2 hours prior to bedtime  4.  Memory change  -Patient saw Dr. Milbert Coulter January 10, 2022 with evidence of MCI.  This does not appear to significantly progress.   Subjective:   [name] [name] was seen today in follow up for Parkinsons disease.  My previous records were reviewed prior to todays visit as well as outside records available to me. Wife with patient and supplements history.  I slightly increased his levodopa last visit.  He did well with that, without side effects.  He has been to outpatient therapy since our last visit.  Notes have been reviewed.  Having trouble with sleep.  Klonopin 90 day lasting 120 day.  He doesn't take it if he has to get up early in the AM.  He also goes to bed really late - like 2am.     Current prescribed movement disorder medications: Pramipexole, 0.75 mg 3 times daily Carbidopa/levodopa 25/100, 1.5 tablet at 7 AM/11 AM/ and 1 tablet at 3 PM/7 PM Carbidopa/levodopa 50/200 CR at bedtime  clonazepam, 0.5 mg, 1/2 tablet at bedtime  Mirtazapine, 15 mg at bedtime   ALLERGIES:  No Known Allergies  CURRENT MEDICATIONS:  Outpatient Encounter Medications as of 10/13/2023  Medication Sig   aspirin 81 MG chewable tablet Chew 81 mg by mouth daily.   carbidopa-levodopa (SINEMET CR) 50-200 MG tablet TAKE 1 TABLET BY MOUTH EVERYDAY AT BEDTIME   carbidopa-levodopa (SINEMET IR) 25-100 MG tablet TAKE 1.5 TABLET AT 7 AM/11 AM/ AND 1 TABLET AT 3 PM/7 PM   Cholecalciferol (VITAMIN D3) 3000 UNITS TABS Take 1,000 Units by mouth daily.    clonazePAM (KLONOPIN) 0.5 MG tablet TAKE 1/2 TABLET (0.25 MG TOTAL) BY MOUTH AT BEDTIME.   COVID-19 mRNA bivalent vaccine, Pfizer, (PFIZER COVID-19 VAC BIVALENT) injection Inject into the muscle.   cyanocobalamin 100 MCG tablet Take 1,000 mcg by mouth daily.    famotidine (PEPCID) 10 MG tablet Take 10 mg by mouth 2 (two) times daily.   Melatonin 3 MG TABS Take 3 mg by mouth at bedtime.    mirtazapine (REMERON) 15 MG tablet Take 1 tablet (15 mg total) by mouth at bedtime.   pramipexole (MIRAPEX)  0.75 MG tablet TAKE 1 TABLET (0.75 MG TOTAL) BY MOUTH 3 (THREE) TIMES DAILY.   trimethoprim-polymyxin b (POLYTRIM) ophthalmic solution Place 1 drop into the right eye every 4 (four) hours.   [DISCONTINUED] benzonatate (TESSALON) 200 MG capsule Take 1 capsule (200 mg total) by mouth 2 (two) times daily as needed for cough. (Patient not taking: Reported on 10/13/2023)   No facility-administered encounter medications on file as of 10/13/2023.    Objective:   PHYSICAL EXAMINATION:    VITALS:   Vitals:   10/13/23 0844  BP: 122/80  Pulse: 79  SpO2: 94%  Weight: 196 lb 6.4 oz (89.1 kg)    GEN:  The patient appears stated age and is in NAD. HEENT:   Normocephalic, atraumatic.  The mucous membranes are moist.  CV:  RRR Lungs:  CTAB Neck:  no bruits  Neurological examination:  Orientation: The patient is alert and oriented x3. Cranial nerves: There is good facial symmetry with facial hypomimia. The speech is fluent and pseudobulbar and a bit difficult to understand. Soft palate rises symmetrically and there is no tongue deviation. Hearing is intact to conversational tone. Sensation: Sensation is intact to light touch throughout Motor: Strength is at least antigravity x4.  Movement examination: Tone: There is nl tone in the ue/le Abnormal movements: none Coordination:  There is mild decremation with toe taps bilaterally and finger taps bilaterally Gait and Station: The patient has no difficulty arising out of a deep-seated chair without the use of the hands. The patient's stride length is good today with the cane but is just a tad off balance  I have reviewed and interpreted the following labs independently    Chemistry      Component Value Date/Time   NA 140 08/14/2021 0906   K 4.3 08/14/2021 0906   CL 104 08/14/2021 0906   CO2 29 08/14/2021 0906   BUN 15 08/14/2021 0906   CREATININE 1.13 08/14/2021 0906   CREATININE 0.94 09/26/2014 0925      Component Value Date/Time   CALCIUM 9.1 08/14/2021 0906   ALKPHOS 53 08/14/2021 0906   AST 26 08/14/2021 0906   ALT 13 08/14/2021 0906   BILITOT 0.7 08/14/2021 0906       Lab Results  Component Value Date   WBC 9.1 04/12/2018   HGB 15.5 04/12/2018   HCT 45.4 04/12/2018   MCV 98.0 04/12/2018   PLT 307.0 04/12/2018    Lab Results  Component Value Date   TSH 1.67 03/27/2015     Total time spent on today's visit was 30 minutes, including both face-to-face time and nonface-to-face time.  Time included that spent on review of records (prior notes available to me/labs/imaging if pertinent), discussing treatment and goals, answering patient's questions and coordinating  care.  Cc:  Sharlene Dory, DO

## 2023-10-13 ENCOUNTER — Ambulatory Visit (INDEPENDENT_AMBULATORY_CARE_PROVIDER_SITE_OTHER): Payer: Medicare Other | Admitting: Neurology

## 2023-10-13 ENCOUNTER — Encounter: Payer: Self-pay | Admitting: Neurology

## 2023-10-13 VITALS — BP 122/80 | HR 79 | Wt 196.4 lb

## 2023-10-13 DIAGNOSIS — G4700 Insomnia, unspecified: Secondary | ICD-10-CM

## 2023-10-13 DIAGNOSIS — G20A2 Parkinson's disease without dyskinesia, with fluctuations: Secondary | ICD-10-CM

## 2023-10-14 ENCOUNTER — Ambulatory Visit: Payer: Medicare Other

## 2023-10-14 DIAGNOSIS — R41841 Cognitive communication deficit: Secondary | ICD-10-CM | POA: Diagnosis not present

## 2023-10-14 DIAGNOSIS — R471 Dysarthria and anarthria: Secondary | ICD-10-CM

## 2023-10-14 NOTE — Therapy (Signed)
OUTPATIENT SPEECH LANGUAGE PATHOLOGY PARKINSON'S TREATMENT   Patient Name: [name] [name] MRN: [id number] DOB:Nov 02, 1950, 73 y.o., male Today's Date: 10/14/2023  PCP: [name] Chafe, MD REFERRING PROVIDER: Kerin Salen, DO  END OF SESSION:  End of Session - 10/14/23 1427     Visit Number 19    Number of Visits 22    Date for SLP Re-Evaluation 10/16/23    SLP Start Time 1409   checked in 1407   SLP Stop Time  1445    SLP Time Calculation (min) 36 min    Activity Tolerance Patient tolerated treatment well                    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/27/2014   Mild neurocognitive disorder due to Parkinson's disease 01/10/2022   Non-Hodgkin lymphoma 2007   diffuse- 6 cycles of chemo with R CHOP Rituxan - last dose 10/08   Nonspecific elevation of levels of transaminase or lactic acid dehydrogenase (LDH) 07/06/2008   Parkinson's disease 07/27/2014   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   Past Surgical History:  Procedure Laterality Date   CHOLECYSTECTOMY, LAPAROSCOPIC     EYE SURGERY     x2 as child   PORT-A-CATH REMOVAL     PORTACATH PLACEMENT     SPLENECTOMY     TONSILLECTOMY     Patient Active Problem List   Diagnosis Date Noted   Mild neurocognitive disorder due to Parkinson's disease 01/10/2022   Gastroesophageal reflux disease 03/07/2020   Localized swelling of right lower extremity 04/12/2019   DOE (dyspnea on exertion) 07/15/2018   10 year risk of MI or stroke 7.5% or greater 04/12/2018   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/2015   Meige syndrome (blepharospasm  with oromandibular dystonia) 07/27/2014   Dysphagia, neurologic 07/27/2014   Parkinson's disease (HCC) 07/27/2014   Dystonia 07/10/2014   Low back pain 11/21/2011   Nonspecific elevation of levels of transaminase or lactic acid dehydrogenase (LDH) 07/06/2008   Enlarged lymph nodes 07/28/2007   Non-Hodgkin lymphoma 03/23/2007    ONSET DATE: Dx in mid-2010's; script dated 06/04/23  REFERRING DIAG:  G20.A1 (ICD-10-CM) - Parkinson's disease without dyskinesia or fluctuating manifestations (HCC)      THERAPY DIAG:  Dysarthria and anarthria  Cognitive communication deficit  Rationale for Evaluation and Treatment: Rehabilitation  SUBJECTIVE:   SUBJECTIVE STATEMENT: "Yesterday we took our son to [place], It was packed. I noticed when I was slouching and sat up and then was louder."  Pt accompanied by: self  PERTINENT HISTORY: Meige syndrome, insomnia, RBD. Pt well known to this SLP - previous ST course d/c'd in April 2024 with pt having met all LTGs.   PAIN:  Are you having pain? No  FALLS: Has patient fallen in last 6 months?  No  PATIENT GOALS: Improve with speech loudness   OBJECTIVE:  Note: Objective measures were completed at Evaluation unless otherwise noted.  DIAGNOSTIC FINDINGS:  From neuropsych evaluation report dated April 2023:  "[name] [name] completed a comprehensive neuropsychological evaluation on 01/10/2022. Please refer to that encounter for the full report and recommendations. Briefly, results suggested isolated impaired  performances across complex attention and semantic fluency. Performance variability was also exhibited across all aspects of learning and memory. Regarding etiology, the most likely culprit for ongoing cognitive weakness remains his past history of Parkinson's disease. While processing speed was improved relative to what might be expected, dysfunction surrounding complex attention and encoding/retrieval deficits of memory are quite common in this  illness. The fact that motor/gait dysfunction was noted years prior to concern surrounding cognitive decline is also a typical timeline associated with Parkinson's disease. Mild anxiety and sleep dysfunction could further influence cognitive performances. Research surrounding cognitive side effects of pramipexole is mixed; however, I cannot rule out an ongoing side effect contribution as well."  .  PATIENT REPORTED OUTCOME MEASURES (PROM): Communication Effectiveness Survey: pt scored himself 16/32 on 07/22/23, with lower scores indicating greater impact of deficit on speech effectiveness/lower QOL.  TODAY'S TREATMENT:       AB=Abdominal Breathing                                                                                                                                  DATE:   10/14/23: Pt conveys another "win" for his communication: asked to say a few words about a topic at a [employer] meeting pt feels he "hit the first few words and kept going. "I could tell I did well," pt stated. Last medicine 1100, next at 1530. Pt spoke for 21 minutes with mod complex conversation. SLP had to ask pt to repeat x4 due to decr'ing volume and pt able to improve production 3/4 times. Last time was after 20 minutes. Loud /a/ targeted today to improve pt's habitual loudness in conversation with average 92dB, and everyday sentences with average 83 dB. Today SLP told pt to talk for the remainder of the session (3 minutes) with a dialogue and pt averaged 68dB.   10/09/23: 0700 AM was last meds, 1100 next meds (30 minutes from now). Loud /a/ targeted today to improve pt's habitual loudness in conversation with average 93dB, and sentences (Everyday) with average 81 dB. In the lobby, pt maintained volume at 68dB with rare min A for loudness. "Arresting" quality approx 30% today, which decr'd volume. Pt cont to have positive communication interactions.   10/05/23: [name] [name] reports wife cont to ask him rarely for repeats. Pt took  last meds at 1100, next PD meds 1530. Loud /a/ targeted today to improve pt's habitual loudness in conversation with average 94dB, and sentences (Everyday) with average 80 dB. In multiple sentence responses, pt average loudness was 67dB with SLP occasional min A for intent and loudness. SLP encouraged pt to cont with speaking with intent and purpose. Pt drank water and soda during today's session without any overt s/sx swallowing deficits.  10/01/23: Pt took last meds at 1000, next PD meds 1530 (after ST). SLP encouraged pt to take meds at 1515 because he had them on hand.  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 Pt to report back to SLP about this.  "I had another win last night" - pt stated nobody asked him to repeat during a virtual [employer] meeting. [name] [name] said prior to ST course he would not have had that occur. Loud /a/ targeted today to improve pt's habitual loudness in conversation with average 92dB, and sentences (Everyday) with average 81 dB. Pt was pleased with these averages today. In conversational segments of 3-7 minutes pt averaged 67 dB, and improved to 68dB with rare min A. He had very little "arresting" vocal quality today.   09/29/23: Last dose for PD was 1200, next dose 3:30PM.   SLP targeted loud  Pt reports "s" statement about being pleased with re-gaining ability to order at the drive throughs. He also is excited about now being able to successfully use his phone for conversations, compared to prior to this ST plan of care. 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 SLP suggested having him play a Bible app which he will do for his next class.  Throughout the session pt volume ranged from 62-71 dB, with pressed voice/arresting voice randomly  occurring - this decr'd pt's speech volume. Average volume in 3 to 6 minute conversational segments was 66dB.  09/21/23: Last dose for PD was 1100, next dose 3:30PM. Pt stated "I caught  myself" when he was not speaking loud enough with family over the holiday.  SLP targeted loud /a/ to improve pt's habitual loudness in conversation with average 91dB, sentences (Everyday) with average 83 dB. No reduction in averages from previous reading two+ weeks ago.  Today SLP and pt spoke in conversational segments of 5 minutes x5, average 68dB. The last segment was 9 minutes and after 6 minutes pt loudness was impacted despite SLP cues for louder speech.   09/02/23: Last PD med was 1200 - next at 1530. "She went in to the other room and closed the door. I started doing the /a/'s. I asked her a question afterwards and she told me she heard the question!" SLP targeted loud /a/ to improve pt's habitual loudness in conversation with average 90dB, sentences (Everyday) with average 83 dB. In short conversatioal segments of 4 minutes x5 pt req'd rare min A for maintaining volume at WNL levels, overall average 68dB.   08/31/23: Conversation prior to /a/ averaged 67dB. "Arresting" approx 40% of the time. SLP targeted loud /a/ to improve muscle memory for incr'd speech loudness and clarity. Pt produced average 91 dB with 10 reps. SLP then targeted everyday sentences and pt averaged 81dB. Speech after loud /a/ averaged 69dB for 7 minutes. Reiterated to pt to perform /a/, sentences, and linguistic homework for 10-15 minutes BID until next session.  08/24/23: Conversation prior to loud /a/ with average 71dB. Pt with vocal "arresting"/"freezing" approx 40% of the time. Loud /a/ performed today with average low 90s dB, conversation afterwards 15 minutes with rare increasing to occasional cues for full breath and loud speech - minimal difficulty with voice "arrest"; Average loudness 69dB. Pt possible to decr to once/week if progress  cont next week.   08/19/23: Last PD meds 1100, next 1530. Loud /a/ targeted to improve muscle memory for improved loudness of speech in conversation was average  90 dB. Sentences were produced with average 82dB. 2-sentence responses resulted in average 68dB with rare min A for loudness. Vocal arresting noted rarely. Conversation between stimuli and/tasks was average 65dB with rare min A for increasing volume. Minimal vocal arresting heard.  08/17/23: Last PD meds 12:00, next 3:30. Pt completed HEP as directed 2/4 days (once other days instead of twice). Pt entered with conversation with mod A usually necessary for increased loudness. Loud /a/ targeted to improve muscle memory to improve loudness for speech in conversation - average 92dB. Sentences produced with average 82dB. Sentence responses averaged 64dB with occasional mod cues for loudness. Cues also throughout session today for posture and full breath.   08/12/23: . SLP ascertained pt practiced twice yesterday, and once already today. Suspect this is one reason for pt's improved average. Sentences were produced with average 81dB. 2-sentence responses were average 66dB. Pt with much vocal "arresting" today with strained voicing. SLP told pt to cont practicing.   08/10/23: 10 minutes of conversation prior to loud /a/ was average 64dB. Pt would improve to WNL loudness but only for approx 15 seconds and then spoke below WNL until the next time he realized he was lower than WNL. Loud /a/ targeted to improve muscle memory for improved loudness of speech in conversation was average 88 dB. Sentences were produced with average 81dB. SLP targeted sentence - level material for responses as pt was not performing as well as previous session. Pt's average volume today with these responses was 66dB with more difficulty with multi-tasking (loudness and content) than previous sessions.SLP strongly encouraged pt to practice even if it is more difficult to find some  alone setting/time in his house, based upon pt's performance today.   08/05/23: SLP targeted loud /a/ to improve muscle memory for incr'd speech loudness and clarity. Pt produced average 90 dB with 7 reps. SLP then targeted sentence level responses and pt averaged 82dB - cues once for breathing like with loud /a/. Pt notably takes adequate breath with loud /a/ and only approx 2/3 as much when talking.    08/03/23: HEP has been more difficult since last session due to family matters (death in the family). SLP targeted loud /a/ to improve muscle memory for incr'd speech loudness and clarity. Pt produced average 88 dB with 5 reps. SLP then targeted sentence level responses and pt averaged 79dB - this improved to 83dB when asked to repeat. Spelling words in a category pt averaged 69dB. Telling 3 items/things in sequence (precede or follow) average loudnesse 66dB. SLP targeted loud /a/ to improve muscle memory for incr'd speech lou of 68dB with extra time, given cognitive component. SLP gave pt homework for sentence level responses. AB noted in 70% of pt's sentence level responses.  07/29/23: In 18 minutes of conversation pt began 85% utterances with volume average 66dB. SLP targeted loud /a/ to improve muscle memory for incr'd speech loudness and clarity.  Pt produced average 91dB with 5 reps. SLP then targeted sentence level responses and pt averaged 85dB. SLP told pt to ensure he performs loud /a/, sentences, and practice louder speech with handouts for 20 minutes BID.   PATIENT EDUCATION: Education details: See "today's treatment" for details Person educated: Patient Education method: Explanation Education comprehension: verbalized understanding  HOME EXERCISE PROGRAM: Loud /a/ and everyday sentences, along with speech tasks matching ability level   GOALS: Goals reviewed with patient? Yes  SHORT TERM GOALS: Target date: 08/21/23  Pt will produce loud /a/ with at least  low 90s dB average over  three sessions  Baseline: 07/27/23, 08/17/23 Goal status: met  2.  Pt will produce 16/20 sentence responses with 69dB over two sessions  Baseline:  Goal status: not met  3.  Pt will produce volume average 68dB in 5 minutes simple conversation with rare min A over three sessions  Baseline:  Goal status: not met  4.  Pt will generate abdominal breathing 80% of the time when engaging in 5 minutes simple conversation in 2 sessions  Baseline:  Goal status: deferred   LONG TERM GOALS: Target date: 10/16/23  Pt will maintain average 67dB over 8 minute simple conversation with rare min A in three sessions  Baseline: 08/31/23, 10/01/23, 10/09/23 Goal status: Met  2.  Pt will remain 100% intelligible out of the speech room for 8- minute conversation, with rare nonverbal cues Baseline:  Goal status: Met  3.  pt will use appropriate posture 75% of the time with rare min A in 8 minutes conversation in three sessions  Baseline: 10/09/23, 10/14/23 Goal status: Modified  4.  Pt will score higher on CES than initial administration  Baseline:  Goal status: INITIAL   ASSESSMENT:  CLINICAL IMPRESSION: Patient is a 73 y.o. male who was seen today for treatment of speech clarity who, on day of eval, demonstrated mod dysarthria and reduced breath support due to Parkinson's disease. See "today's treatment" for more details on today's session. Pt continues to report communication successes from session to session. Pt would benefit from cont'd skilled ST targeting loudness in conversation. At this time he does not endorse swallowing difficulty but SLP will cont to monitor informally and goal/s added PRN. Memory compensations targeted previous therapy course are reportedly still helpful for pt.  OBJECTIVE IMPAIRMENTS: Objective impairments include attention, memory, and dysarthria. These impairments are limiting patient from ADLs/IADLs and effectively communicating at home and in community.Factors affecting  potential to achieve goals and functional outcome are previous level of function and severity of impairments.. Patient will benefit from skilled SLP services to address above impairments and improve overall function.  REHAB POTENTIAL: Good  PLAN:  SLP FREQUENCY: 2x/week  SLP DURATION: 4 weeks  PLANNED INTERVENTIONS: 92507 Treatment of speech (30 or 45 min) , Environmental controls, Cueing hierachy, Internal/external aids, Functional tasks, Multimodal communication approach, SLP instruction and feedback, Compensatory strategies, and Patient/family education    [facility], CCC-SLP 10/14/2023, 2:30 PM

## 2023-10-21 ENCOUNTER — Ambulatory Visit: Payer: Medicare Other

## 2023-10-21 DIAGNOSIS — R41841 Cognitive communication deficit: Secondary | ICD-10-CM

## 2023-10-21 DIAGNOSIS — R471 Dysarthria and anarthria: Secondary | ICD-10-CM

## 2023-10-21 NOTE — Therapy (Signed)
OUTPATIENT SPEECH LANGUAGE PATHOLOGY PARKINSONS TREATMENT/RECERT-DISCHARGE   Patient Name: [name] [name] MRN: [id number] DOB:[birth year], 73 y.o., male Today's Date: 10/21/2023  PCP: Arva Chafe, MD REFERRING PROVIDER: Kerin Salen, DO  END OF SESSION:  End of Session - 10/21/23 1459     Visit Number 20    Number of Visits 22    Date for SLP Re-Evaluation 10/21/23    SLP Start Time 1403    SLP Stop Time  1445    SLP Time Calculation (min) 42 min    Activity Tolerance Patient tolerated treatment well                     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   Mild neurocognitive disorder due to Parkinson's disease 01/10/2022   Non-Hodgkin lymphoma 2007   diffuse- 6 cycles of chemo with R CHOP Rituxan - last dose 10/08   Nonspecific elevation of levels of transaminase or lactic acid dehydrogenase (LDH) 07/06/2008   Parkinson's disease 07/27/2014   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/2015   Past Surgical History:  Procedure Laterality Date   CHOLECYSTECTOMY, LAPAROSCOPIC     EYE SURGERY     x2 as child   PORT-A-CATH REMOVAL     PORTACATH PLACEMENT     SPLENECTOMY     TONSILLECTOMY     Patient Active Problem List   Diagnosis Date Noted   Mild neurocognitive disorder due to Parkinson's disease 01/10/2022   Gastroesophageal reflux disease 03/07/2020   Localized swelling of right lower extremity 04/12/2019   DOE (dyspnea on exertion) 07/15/2018   10 year risk of MI or stroke 7.5% or greater 04/12/2018   Post-splenectomy 04/02/2015   Meige syndrome (blepharospasm  with oromandibular dystonia) 07/27/2014   Dysphagia, neurologic 07/27/2014   Parkinson's disease (HCC) 07/27/2014   Dystonia 07/10/2014   Low back pain 11/21/2011   Nonspecific elevation of levels of transaminase or lactic acid dehydrogenase (LDH) 07/06/2008   Enlarged lymph nodes 07/28/2007   Non-Hodgkin lymphoma 03/23/2007    SPEECH THERAPY RECERT-DISCHARGE SUMMARY  Visits from Start of Care: 20  Current functional level related to goals / functional outcomes: Pt will have LTGs enforced for today's session and then will be d/c'd.  He has improved dramatically with speech volume from initiation of therapy to today's d/c. He has explained a "win" with communication each visit for the last 4-5 visits.    Remaining deficits: Mild dysarthria especially when fatigued.   Education / Equipment: See therapy session notes.   Patient agrees to discharge. Patient goals were met. Patient is being discharged due to meeting the stated rehab goals.. Pt should be seen in approx 6 months for a multi-D PD screen.     ONSET DATE: Dx in mid-2010's; script dated 06/04/23  REFERRING DIAG:  G20.A1 (ICD-10-CM) - Parkinson's disease without dyskinesia or fluctuating manifestations (HCC)      THERAPY DIAG:  Dysarthria and anarthria  Cognitive communication deficit  Rationale for Evaluation and Treatment: Rehabilitation  SUBJECTIVE:   SUBJECTIVE STATEMENT: "I had another big win Sunday."  Pt accompanied by: self  PERTINENT HISTORY: Meige syndrome,  insomnia, RBD. Pt well known to this SLP - previous ST course d/c'd in April 2024 with pt having met all LTGs.   PAIN:  Are you having pain? No  FALLS: Has patient fallen in last 6 months?  No  PATIENT GOALS: Improve with speech loudness   OBJECTIVE:  Note: Objective measures were completed at Evaluation unless otherwise noted.  DIAGNOSTIC FINDINGS:  From neuropsych evaluation report dated April 2023:  "[name] [name] completed a  comprehensive neuropsychological evaluation on 01/10/2022. Please refer to that encounter for the full report and recommendations. Briefly, results suggested isolated impaired performances across complex attention and semantic fluency. Performance variability was also exhibited across all aspects of learning and memory. Regarding etiology, the most likely culprit for ongoing cognitive weakness remains his past history of Parkinson's disease. While processing speed was improved relative to what might be expected, dysfunction surrounding complex attention and encoding/retrieval deficits of memory are quite common in this illness. The fact that motor/gait dysfunction was noted years prior to concern surrounding cognitive decline is also a typical timeline associated with Parkinson's disease. Mild anxiety and sleep dysfunction could further influence cognitive performances. Research surrounding cognitive side effects of pramipexole is mixed; however, I cannot rule out an ongoing side effect contribution as well."  .  PATIENT REPORTED OUTCOME MEASURES (PROM): Communication Effectiveness Survey: pt scored himself 16/32 on 07/22/23, with lower scores indicating greater impact of deficit on speech effectiveness/lower QOL.  TODAY'S TREATMENT:       AB=Abdominal Breathing                                                                                                                                  DATE:   10/21/23: "As I was speaking I kept on thinking, "Be intentional! And no one once asked me to repeat myself." In preparation for d/c today SLP educated pt on and reviewed the Parkinson Voice Project (PVP) SPEAKOUT! Daily practice online sessions. SLP shaped pt's productions and he and SLP watched together. Pt was independent for each exercise (M-words, "ah", glides, numbers) by end of today's session. SLP highly stressed daily production of "ah", everyday sentences, and online PVP session x5 days/week. PROM was not  completed today however pt continually told SLP for each of the last 4-5 sessions that he has had improvements in different communicative areas.  10/14/23: Pt conveys another "win" for his communication: asked to say a few words about a topic at a [employer] meeting pt feels he "hit the first few words and kept going. "I could tell I did well," pt stated. Last medicine 1100, next at 1530. Pt spoke for 21 minutes with mod complex conversation. SLP had to ask pt to repeat x4 due to decr'ing volume and pt able to improve production 3/4 times. Last time was after 20 minutes. Loud /a/ targeted today to improve pt's habitual loudness in conversation with average 92dB, and everyday sentences with average 83 dB.  Today SLP told pt to talk for the remainder of the session (3 minutes) with a dialogue and pt averaged 68dB.   10/09/23: 0700 AM was last meds, 1100 next meds (30 minutes from now). Loud /a/ targeted today to improve pt's habitual loudness in conversation with average 93dB, and sentences (Everyday) with average 81 dB. In the lobby, pt maintained volume at 68dB with rare min A for loudness. "Arresting" quality approx 30% today, which decr'd volume. Pt cont to have positive communication interactions.   10/05/23: [name] [name] reports wife cont to ask him rarely for repeats. Pt took last meds at 1100, next PD meds 1530. Loud /a/ targeted today to improve pt's habitual loudness in conversation with average 94dB, and sentences (Everyday) with average 80 dB. In multiple sentence responses, pt average loudness was 67dB with SLP occasional min A for intent and loudness. SLP encouraged pt to cont with speaking with intent and purpose. Pt drank water and soda during today's session without any overt s/sx swallowing deficits.  10/01/23: Pt took last meds at 1000, next PD meds 1530 (after ST). SLP encouraged pt to take meds at 1515 because he had them on hand.  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 Pt to report back to SLP about this.  "I had another win last night" - pt stated nobody asked him to repeat during a virtual [employer] meeting. [name] [name] said prior to ST course he would not have had that occur. Loud /a/ targeted today to improve pt's habitual loudness in conversation with average 92dB, and sentences (Everyday) with average 81 dB. Pt was pleased with these averages today. In conversational segments of 3-7 minutes pt averaged 67 dB, and improved to 68dB with rare min A. He had very little "arresting" vocal quality today.   09/29/23: Last dose for PD was 1200, next dose 3:30PM.   SLP targeted loud  Pt reports "s" statement about being pleased with re-gaining ability to order at the drive throughs. He also is excited about now being able to successfully use his phone for conversations, compared to prior to this ST plan of care. 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 SLP suggested having him play a Bible app which he will do for his next class.  Throughout the session pt volume ranged from 62-71 dB, with pressed voice/arresting voice randomly occurring - this decr'd pt's speech volume. Average volume in 3 to 6 minute conversational segments was 66dB.  09/21/23: Last dose for PD was 1100, next dose 3:30PM. Pt stated "I caught  myself" when he was not speaking loud enough with family over the holiday.  SLP targeted loud /a/ to improve pt's habitual loudness in conversation with average 91dB, sentences (Everyday) with average 83 dB. No reduction in averages from previous reading two+ weeks ago.  Today SLP and pt spoke in conversational segments of 5 minutes x5, average 68dB. The last segment was 9 minutes and after 6 minutes pt loudness was impacted despite SLP cues for louder speech.    09/02/23: Last PD med was 1200 - next at 1530. "She went in to the other room and closed the door. I started doing the /a/'s. I asked her a question afterwards and she told  me she heard the question!" SLP targeted loud /a/ to improve pt's habitual loudness in conversation with average 90dB, sentences (Everyday) with average 83 dB. In short conversatioal segments of 4 minutes x5 pt req'd rare min A for maintaining volume at WNL levels, overall average 68dB.   08/31/23: Conversation prior to /a/ averaged 67dB. "Arresting" approx 40% of the time. SLP targeted loud /a/ to improve muscle memory for incr'd speech loudness and clarity. Pt produced average 91 dB with 10 reps. SLP then targeted everyday sentences and pt averaged 81dB. Speech after loud /a/ averaged 69dB for 7 minutes. Reiterated to pt to perform /a/, sentences, and linguistic homework for 10-15 minutes BID until next session.  08/24/23: Conversation prior to loud /a/ with average 71dB. Pt with vocal "arresting"/"freezing" approx 40% of the time. Loud /a/ performed today with average low 90s dB, conversation afterwards 15 minutes with rare increasing to occasional cues for full breath and loud speech - minimal difficulty with voice "arrest"; Average loudness 69dB. Pt possible to decr to once/week if progress cont next week.   08/19/23: Last PD meds 1100, next 1530. Loud /a/ targeted to improve muscle memory for improved loudness of speech in conversation was average 90 dB. Sentences were produced with average 82dB. 2-sentence responses resulted in average 68dB with rare min A for loudness. Vocal arresting noted rarely. Conversation between stimuli and/tasks was average 65dB with rare min A for increasing volume. Minimal vocal arresting heard.  08/17/23: Last PD meds 12:00, next 3:30. Pt completed HEP as directed 2/4 days (once other days instead of twice). Pt entered with conversation with mod A usually necessary for increased loudness. Loud /a/  targeted to improve muscle memory to improve loudness for speech in conversation - average 92dB. Sentences produced with average 82dB. Sentence responses averaged 64dB with occasional mod cues for loudness. Cues also throughout session today for posture and full breath.   08/12/23: . SLP ascertained pt practiced twice yesterday, and once already today. Suspect this is one reason for pt's improved average. Sentences were produced with average 81dB. 2-sentence responses were average 66dB. Pt with much vocal "arresting" today with strained voicing. SLP told pt to cont practicing.   08/10/23: 10 minutes of conversation prior to loud /a/ was average 64dB. Pt would improve to WNL loudness but only for approx 15 seconds and then spoke below WNL until the next time he realized he was lower than WNL. Loud /a/ targeted to improve muscle memory for improved loudness of speech in conversation was average 88 dB. Sentences were produced with average 81dB. SLP targeted sentence - level material for responses as pt was not performing as well as previous session. Pt's average volume today with these responses was 66dB with more difficulty with multi-tasking (loudness and content) than previous sessions.SLP strongly encouraged pt to practice even if it is more difficult to find some alone setting/time in his house, based upon pt's performance today.   08/05/23: SLP targeted loud /a/ to improve muscle memory for incr'd speech loudness and clarity. Pt produced average 90 dB with 7 reps. SLP then targeted sentence level responses and pt averaged 82dB - cues once for breathing like with loud /a/. Pt notably takes adequate breath with loud /a/ and only approx 2/3 as much when talking.    08/03/23: HEP has been more difficult since last session due to family matters (death in the family). SLP targeted loud /a/ to improve muscle memory for incr'd speech loudness and clarity. Pt produced average  88 dB with 5 reps. SLP then targeted  sentence level responses and pt averaged 79dB - this improved to 83dB when asked to repeat. Spelling words in a category pt averaged 69dB. Telling 3 items/things in sequence (precede or follow) average loudnesse 66dB. SLP targeted loud /a/ to improve muscle memory for incr'd speech lou of 68dB with extra time, given cognitive component. SLP gave pt homework for sentence level responses. AB noted in 70% of pt's sentence level responses.  07/29/23: In 18 minutes of conversation pt began 85% utterances with volume average 66dB. SLP targeted loud /a/ to improve muscle memory for incr'd speech loudness and clarity.  Pt produced average 91dB with 5 reps. SLP then targeted sentence level responses and pt averaged 85dB. SLP told pt to ensure he performs loud /a/, sentences, and practice louder speech with handouts for 20 minutes BID.   PATIENT EDUCATION: Education details: See "today's treatment" for details Person educated: Patient Education method: Explanation Education comprehension: verbalized understanding  HOME EXERCISE PROGRAM: Loud /a/ and everyday sentences, along with speech tasks matching ability level   GOALS: Goals reviewed with patient? Yes  SHORT TERM GOALS: Target date: 08/21/23  Pt will produce loud /a/ with at least low 90s dB average over three sessions  Baseline: 07/27/23, 08/17/23 Goal status: met  2.  Pt will produce 16/20 sentence responses with 69dB over two sessions  Baseline:  Goal status: not met  3.  Pt will produce volume average 68dB in 5 minutes simple conversation with rare min A over three sessions  Baseline:  Goal status: not met  4.  Pt will generate abdominal breathing 80% of the time when engaging in 5 minutes simple conversation in 2 sessions  Baseline:  Goal status: deferred   LONG TERM GOALS: Target date: 10/16/23  Pt will maintain average 67dB over 8 minute simple conversation with rare min A in three sessions  Baseline: 08/31/23, 10/01/23,  10/09/23 Goal status: Met  2.  Pt will remain 100% intelligible out of the speech room for 8- minute conversation, with rare nonverbal cues Baseline:  Goal status: Met  3.  pt will use appropriate posture 75% of the time with rare min A in 8 minutes conversation in three sessions  Baseline: 10/09/23, 10/14/23 Goal status: met  4.  Pt will score higher on CES than initial administration  Baseline:  Goal status: MET   ASSESSMENT:  CLINICAL IMPRESSION: Patient is a 73 y.o. male who was seen today for treatment of speech clarity who, on day of eval, demonstrated mod dysarthria and reduced breath support due to Parkinson's disease. Today pt demonstrated mild dysarthria and was 100% intelligible. See "today's treatment" for more details on today's session. Pt still continued to report communication successes today, since last session.   OBJECTIVE IMPAIRMENTS: Objective impairments include attention, memory, and dysarthria. These impairments are limiting patient from ADLs/IADLs and effectively communicating at home and in community.Factors affecting potential to achieve goals and functional outcome are previous level of function and severity of impairments.. Patient will benefit from skilled SLP services to address above impairments and improve overall function.  REHAB POTENTIAL: Good  PLAN:  SLP FREQUENCY: 2x/week  SLP DURATION: 4 weeks  PLANNED INTERVENTIONS: 92507 Treatment of speech (30 or 45 min) , Environmental controls, Cueing hierachy, Internal/external aids, Functional tasks, Multimodal communication approach, SLP instruction and feedback, Compensatory strategies, and Patient/family education    [facility], CCC-SLP 10/21/2023, 2:59 PM

## 2023-10-25 ENCOUNTER — Other Ambulatory Visit: Payer: Self-pay | Admitting: Neurology

## 2023-10-25 DIAGNOSIS — G20A2 Parkinson's disease without dyskinesia, with fluctuations: Secondary | ICD-10-CM

## 2024-01-20 ENCOUNTER — Other Ambulatory Visit: Payer: Self-pay | Admitting: Neurology

## 2024-01-20 DIAGNOSIS — G20A2 Parkinson's disease without dyskinesia, with fluctuations: Secondary | ICD-10-CM

## 2024-03-21 ENCOUNTER — Other Ambulatory Visit: Payer: Self-pay | Admitting: Neurology

## 2024-03-21 DIAGNOSIS — G20A2 Parkinson's disease without dyskinesia, with fluctuations: Secondary | ICD-10-CM

## 2024-03-22 ENCOUNTER — Other Ambulatory Visit: Payer: Self-pay | Admitting: Neurology

## 2024-03-22 DIAGNOSIS — G20A2 Parkinson's disease without dyskinesia, with fluctuations: Secondary | ICD-10-CM

## 2024-04-08 NOTE — Progress Notes (Signed)
 Assessment/Plan:   1.  Parkinsons Disease  -continue carbidopa /levodopa  25/100, 1.5 tablet at 7 AM/11 AM/ and 1 tablet at 3 PM/7 PM  - continue carbidopa /levodopa  50/200 CR q hs  -Continue pramipexole , 0.75 mg 3 times per day  -would like to see him use a walker  -handicap placard form filled out   2.  Meige syndrome  -Stable  -no tx needed right now  3.  Insomnia/mild RBD  -Continue clonazepam , 0.5 mg, 1/2 tablet at night.    -Continue remeron , 15 mg q hs.     -discussed sleep hygiene.  His is not good.  Also, discussed screen time and putitng down ipad at least 2 hours prior to bedtime  4.  Memory change  -Patient saw Dr. Richie January 10, 2022 with evidence of MCI.  This does not appear to significantly progress.   Subjective:   [name] [name] was seen today in follow up for Parkinsons disease.  My previous records were reviewed prior to todays visit as well as outside records available to me. Wife with patient and supplements history.  Pt 2 falls - one time getting out of the car and was closing the door and fell and fell back.  He didn't hit his head.  He fell on his bottom.  He was able to get up with his cane and pulling himself up with 2 cars.  The other he froze and fell forward.  He did hit his head.  He states that he made adjustments since then and is more careful in/out of the car.  He doesn't exercise.  Pt denies lightheadedness, near syncope.  No hallucinations.  Mood has been good.   Current prescribed movement disorder medications: Pramipexole , 0.75 mg 3 times daily Carbidopa /levodopa  25/100, 1.5 tablet at 7 AM/11 AM/ and 1 tablet at 3 PM/7 PM Carbidopa /levodopa  50/200 CR at bedtime  clonazepam , 0.5 mg, 1/2 tablet at bedtime  Mirtazapine , 15 mg at bedtime   ALLERGIES:  No Known Allergies  CURRENT MEDICATIONS:  Outpatient Encounter Medications as of 04/12/2024  Medication Sig   aspirin 81 MG chewable tablet Chew 81 mg by mouth daily.   carbidopa -levodopa   (SINEMET  CR) 50-200 MG tablet TAKE 1 TABLET BY MOUTH EVERYDAY AT BEDTIME   carbidopa -levodopa  (SINEMET  IR) 25-100 MG tablet TAKE 1.5 TABLET AT 7 AM/11 AM/ AND 1 TABLET AT 3 PM/7 PM   Cholecalciferol (VITAMIN D3) 3000 UNITS TABS Take 1,000 Units by mouth daily.    clonazePAM  (KLONOPIN ) 0.5 MG tablet TAKE 1/2 TABLET (0.25 MG TOTAL) BY MOUTH AT BEDTIME.   COVID-19 mRNA bivalent vaccine, Pfizer, (PFIZER COVID-19 VAC BIVALENT) injection Inject into the muscle.   cyanocobalamin 100 MCG tablet Take 1,000 mcg by mouth daily.    famotidine (PEPCID) 10 MG tablet Take 10 mg by mouth 2 (two) times daily.   Melatonin 3 MG TABS Take 3 mg by mouth at bedtime.    mirtazapine  (REMERON ) 15 MG tablet Take 1 tablet (15 mg total) by mouth at bedtime.   pramipexole  (MIRAPEX ) 0.75 MG tablet TAKE 1 TABLET (0.75 MG TOTAL) BY MOUTH 3 (THREE) TIMES DAILY.   trimethoprim -polymyxin b  (POLYTRIM ) ophthalmic solution Place 1 drop into the right eye every 4 (four) hours.   No facility-administered encounter medications on file as of 04/12/2024.    Objective:   PHYSICAL EXAMINATION:    VITALS:   Vitals:   04/12/24 0851  BP: 116/70  Pulse: 71  SpO2: 93%  Weight: 191 lb (86.6 kg)  GEN:  The patient appears stated age and is in NAD. HEENT:  Normocephalic, atraumatic.  The mucous membranes are moist.  CV:  RRR Lungs:  CTAB Neck:  no bruits  Neurological examination:  Orientation: The patient is alert and oriented x3. Cranial nerves: There is good facial symmetry with facial hypomimia. The speech is fluent and pseudobulbar and dysarthric. Soft palate rises symmetrically and there is no tongue deviation. Hearing is intact to conversational tone. Sensation: Sensation is intact to light touch throughout Motor: Strength is at least antigravity x4.  Movement examination: Tone: There is nl tone in the ue/le Abnormal movements: none Coordination:  There is mild decremation with toe taps bilaterally and finger taps  bilaterally Gait and Station: The patient has no difficulty arising out of a deep-seated chair without the use of the hands. The patient freezes when first arises.  He ambulates with cane.  Slightly off balance.  I have reviewed and interpreted the following labs independently    Chemistry      Component Value Date/Time   NA 140 08/14/2021 0906   K 4.3 08/14/2021 0906   CL 104 08/14/2021 0906   CO2 29 08/14/2021 0906   BUN 15 08/14/2021 0906   CREATININE 1.13 08/14/2021 0906   CREATININE 0.94 09/26/2014 0925      Component Value Date/Time   CALCIUM 9.1 08/14/2021 0906   ALKPHOS 53 08/14/2021 0906   AST 26 08/14/2021 0906   ALT 13 08/14/2021 0906   BILITOT 0.7 08/14/2021 0906       Lab Results  Component Value Date   WBC 9.1 04/12/2018   HGB 15.5 04/12/2018   HCT 45.4 04/12/2018   MCV 98.0 04/12/2018   PLT 307.0 04/12/2018    Lab Results  Component Value Date   TSH 1.67 03/27/2015     Total time spent on today's visit was 40 minutes, including both face-to-face time and nonface-to-face time.  Time included that spent on review of records (prior notes available to me/labs/imaging if pertinent), discussing treatment and goals, answering patient's questions and coordinating care.  Cc:  Frann Mabel Mt, DO

## 2024-04-12 ENCOUNTER — Encounter: Payer: Self-pay | Admitting: Neurology

## 2024-04-12 ENCOUNTER — Ambulatory Visit (INDEPENDENT_AMBULATORY_CARE_PROVIDER_SITE_OTHER): Payer: Medicare Other | Admitting: Neurology

## 2024-04-12 VITALS — BP 116/70 | HR 71 | Wt 191.0 lb

## 2024-04-12 DIAGNOSIS — R471 Dysarthria and anarthria: Secondary | ICD-10-CM

## 2024-04-12 DIAGNOSIS — G20A2 Parkinson's disease without dyskinesia, with fluctuations: Secondary | ICD-10-CM | POA: Diagnosis not present

## 2024-04-12 DIAGNOSIS — R498 Other voice and resonance disorders: Secondary | ICD-10-CM | POA: Diagnosis not present

## 2024-04-12 MED ORDER — PRAMIPEXOLE DIHYDROCHLORIDE 0.75 MG PO TABS: 0.7500 mg | ORAL_TABLET | Freq: Three times a day (TID) | ORAL | 2 refills | Status: AC

## 2024-04-12 MED ORDER — CARBIDOPA-LEVODOPA 25-100 MG PO TABS: ORAL_TABLET | ORAL | 2 refills | Status: AC

## 2024-04-12 MED ORDER — CARBIDOPA-LEVODOPA ER 50-200 MG PO TBCR: EXTENDED_RELEASE_TABLET | ORAL | 2 refills | Status: AC

## 2024-04-12 MED ORDER — MIRTAZAPINE 15 MG PO TABS: 15.0000 mg | ORAL_TABLET | Freq: Every day | ORAL | 2 refills | Status: AC

## 2024-04-12 MED ORDER — CLONAZEPAM 0.5 MG PO TABS: 0.5000 mg | ORAL_TABLET | Freq: Every day | ORAL | 1 refills | Status: DC

## 2024-04-12 NOTE — Patient Instructions (Signed)

## 2024-05-04 ENCOUNTER — Other Ambulatory Visit: Payer: Self-pay

## 2024-05-04 ENCOUNTER — Encounter: Payer: Self-pay | Admitting: Physical Therapy

## 2024-05-04 ENCOUNTER — Ambulatory Visit: Attending: Neurology | Admitting: Physical Therapy

## 2024-05-04 ENCOUNTER — Ambulatory Visit

## 2024-05-04 DIAGNOSIS — R29818 Other symptoms and signs involving the nervous system: Secondary | ICD-10-CM | POA: Insufficient documentation

## 2024-05-04 DIAGNOSIS — R471 Dysarthria and anarthria: Secondary | ICD-10-CM | POA: Diagnosis not present

## 2024-05-04 DIAGNOSIS — G20A2 Parkinson's disease without dyskinesia, with fluctuations: Secondary | ICD-10-CM | POA: Insufficient documentation

## 2024-05-04 DIAGNOSIS — R41841 Cognitive communication deficit: Secondary | ICD-10-CM | POA: Diagnosis not present

## 2024-05-04 DIAGNOSIS — R2681 Unsteadiness on feet: Secondary | ICD-10-CM | POA: Diagnosis not present

## 2024-05-04 DIAGNOSIS — R498 Other voice and resonance disorders: Secondary | ICD-10-CM | POA: Insufficient documentation

## 2024-05-04 DIAGNOSIS — R2689 Other abnormalities of gait and mobility: Secondary | ICD-10-CM | POA: Diagnosis not present

## 2024-05-04 DIAGNOSIS — R293 Abnormal posture: Secondary | ICD-10-CM | POA: Diagnosis not present

## 2024-05-04 DIAGNOSIS — M6281 Muscle weakness (generalized): Secondary | ICD-10-CM | POA: Diagnosis not present

## 2024-05-04 NOTE — Therapy (Signed)
 OUTPATIENT PHYSICAL THERAPY NEURO EVALUATION   Patient Name: [name] [name] MRN: [id number] DOB:Oct 17, 1950, 73 y.o., male Today's Date: 05/04/2024   PCP: Frann REFERRING PROVIDER: Evonnie Asberry RAMAN, DO   END OF SESSION:  PT End of Session - 05/04/24 1746     Visit Number 1    Number of Visits 18    Date for PT Re-Evaluation 07/01/24    Authorization Type Medicare/BCBS    Progress Note Due on Visit 10    PT Start Time 1537    PT Stop Time 1616    PT Time Calculation (min) 39 min    Activity Tolerance Patient tolerated treatment well    Behavior During Therapy [facility] for tasks assessed/performed          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/27/2014   Mild neurocognitive disorder due to Parkinson's disease 01/10/2022   Non-Hodgkin lymphoma 2007   diffuse- 6 cycles of chemo with R CHOP Rituxan - last dose 10/08   Nonspecific elevation of levels of transaminase or lactic acid dehydrogenase (LDH) 07/06/2008   Parkinson's disease 07/27/2014   Post-splenectomy 04/02/2015   Past Surgical History:  Procedure Laterality Date   CHOLECYSTECTOMY, LAPAROSCOPIC     EYE SURGERY     x2 as child   PORT-A-CATH REMOVAL     PORTACATH PLACEMENT     SPLENECTOMY     TONSILLECTOMY     Patient Active Problem List   Diagnosis Date Noted   Mild neurocognitive disorder due to Parkinson's disease 01/10/2022   Gastroesophageal reflux disease 03/07/2020   Localized swelling of right lower extremity 04/12/2019   DOE (dyspnea on exertion) 07/15/2018   10 year risk of MI or stroke 7.5% or greater  04/12/2018   Post-splenectomy 04/02/2015   Meige syndrome (blepharospasm with oromandibular dystonia) 07/27/2014   Dysphagia, neurologic 07/27/2014   Parkinson's disease (HCC) 07/27/2014   Dystonia 07/10/2014   Low back pain 11/21/2011   Nonspecific elevation of levels of transaminase or lactic acid dehydrogenase (LDH) 07/06/2008   Enlarged lymph nodes 07/28/2007   Non-Hodgkin lymphoma 03/23/2007    ONSET DATE: 04/12/2024  REFERRING DIAG:  G20.A2 (ICD-10-CM) - Parkinson's disease without dyskinesia, with fluctuating manifestations (HCC)  R49.8 (ICD-10-CM) - Hypophonia  R47.1 (ICD-10-CM) - Dysarthria    THERAPY DIAG:  Unsteadiness on feet  Other abnormalities of gait and mobility  Muscle weakness (generalized)  Abnormal posture  Other symptoms and signs involving the nervous system  Rationale for Evaluation and Treatment: Rehabilitation  SUBJECTIVE:  SUBJECTIVE STATEMENT: Dr. Evonnie was concerned because I fell twice.  Pretty ugly falls-one that I closed the car door and backed up and fell on my bottom; was able to get up with arms/cane/car.  Second fall was turning after closing the door of the car and fell in the parking lot-hit my knee and chin.  Someone helped me get up from that fall.  Freezing happens almost every time I get up; turn are problematic. Pt accompanied by: self  PERTINENT HISTORY: Hx of Meige syndrome, insomnia, RBD, PD, GERD, MCI  PAIN:  Are you having pain? Yes: NPRS scale: 1-2/10 Pain location: L hip/buttocks pain Pain description: sore Aggravating factors: lifting leg Relieving factors: sitting  PRECAUTIONS: Fall  RED FLAGS: None   WEIGHT BEARING RESTRICTIONS: No  FALLS: Has patient fallen in last 6 months? Yes. Number of falls 2  LIVING ENVIRONMENT: Lives  with: lives with their family Lives in: House/apartment Stairs: 3 main sets of stairs; bilateral rails for first set; L rails going up to third floor hobby room Has following equipment at home: Single point cane and with small quad tip base; Has RW, but doesn't use  PLOF: Independent with household mobility with device and Independent with community mobility with device  PATIENT GOALS: To get back to where I was  OBJECTIVE:  Note: Objective measures were completed at Evaluation unless otherwise noted.  DIAGNOSTIC FINDINGS: NA this episode  COGNITION: Overall cognitive status: Within functional limits for tasks assessed and History of cognitive impairments - at baseline   POSTURE: rounded shoulders, forward head, posterior pelvic tilt, flexed trunk , and weight shift right  LOWER EXTREMITY ROM:   WFL seated  Active  Right Eval Left Eval  Hip flexion    Hip extension    Hip abduction    Hip adduction    Hip internal rotation    Hip external rotation    Knee flexion    Knee extension    Ankle dorsiflexion    Ankle plantarflexion    Ankle inversion    Ankle eversion     (Blank rows = not tested)  LOWER EXTREMITY MMT:    MMT Right Eval Left Eval  Hip flexion 4 4  Hip extension    Hip abduction 4 4  Hip adduction 4 4  Hip internal rotation    Hip external rotation    Knee flexion 4 4  Knee extension 4 4  Ankle dorsiflexion 4 4  Ankle plantarflexion    Ankle inversion    Ankle eversion    (Blank rows = not tested)  TRANSFERS: Sit to stand: Modified independence  Assistive device utilized: None     Stand to sit: Modified independence  Assistive device utilized: None     More difficult time getting up from soft,low surface-recliner, sofa, desk chair GAIT: Findings: Gait Characteristics: step through pattern, decreased step length- Right, decreased step length- Left, shuffling, festinating, narrow BOS, poor foot clearance- Right, and poor foot clearance- Left,  Distance walked: 50 ft, Assistive device utilized:Single point cane, Level of assistance: SBA, and Comments: festination with turns, and difficulty turning fully to sit in chair   FUNCTIONAL TESTS:  5 times sit to stand: 9.72 sec with some posterior lean Timed up and go (TUG): 16.34 sec-decreased eccentric control to fully turn to sit 10 meter walk test: 11.1 sec = 2.95 ft/sec 360 turn R:  19 steps, 10.53 sec 360 turn L:  17 steps, 8.09 sec TUG manual:  18.06 sec-decreased eccentric  control to sit TUG cognitive:  19.06 sec-decreased eccentric control, festination with turn to sit (nearly misses chair) 3 M walk back:  7.96 sec                     [facility] PT Assessment - 05/04/24 1606       Standardized Balance Assessment   Standardized Balance Assessment Mini-BESTest      Mini-BESTest   Sit To Stand Normal: Comes to stand without use of hands and stabilizes independently.    Rise to Toes < 3 s.    Stand on one leg (left) Severe: Unable    Stand on one leg (right) Severe: Unable    Stand on one leg - lowest score 0    Compensatory Stepping Correction - Forward No step, OR would fall if not caught, OR falls spontaneously.    Compensatory Stepping Correction - Backward No step, OR would fall if not caught, OR falls spontaneously.    Compensatory Stepping Correction - Left Lateral Severe: Falls, or cannot step    Compensatory Stepping Correction - Right Lateral Severe:  Falls, or cannot step    Stepping Corredtion Lateral - lowest score 0    Stance - Feet together, eyes open, firm surface  Normal: 30s    Stance - Feet together, eyes closed, foam surface  Normal: 30s    Incline - Eyes Closed Moderate: Stands independently < 30s OR aligns with surface   9.66   Change in Gait Speed Moderate: Unable to change walking speed or signs of imbalance    Walk with head turns - Horizontal Moderate: performs head turns with reduction in gait speed.    Walk with pivot turns Severe: Cannot turn with feet  close at any speed without imbalance.    Step over obstacles Moderate: Steps over box but touches box OR displays cautious behavior by slowing gait.    Timed UP & GO with Dual Task Moderate: Dual Task affects either counting OR walking (>10%) when compared to the TUG without Dual Task.    Mini-BEST total score 11                                                                                                                  TREATMENT DATE: 05/04/2024    PATIENT EDUCATION: Education details: Eval results, POC Person educated: Patient Education method: Explanation Education comprehension: verbalized understanding  HOME EXERCISE PROGRAM: Not yet initiated  GOALS: Goals reviewed with patient? Yes  SHORT TERM GOALS: Target date: 06/03/2024  Pt will be independent with HEP for improved strength, balance, gait. Baseline: Goal status: INITIAL  2.  Pt will improve 5x sit<>stand to less than or equal to 10 sec, with no episodes of posterior lean, to demonstrate improved functional strength and transfer efficiency. Baseline: 9.72 sec and 2 episodes of posterior lean Goal status: INITIAL  3.  Pt will improve TUG score to less than or equal to 13.5 sec for decreased fall risk.  Baseline: 16.34 sec Goal status:  INITIAL   LONG TERM GOALS: Target date: 07/01/2024  Pt will be independent with HEP for improved strength, balance, gait. Baseline:  Goal status: INITIAL  2.  Pt will improve TUG manual/TUG cognitive score to less than or equal to 15 sec for decreased fall risk. Baseline: 18/19 sec Goal status: INITIAL  3.  Pt will improve MiniBESTest score to at least 18/28 to decrease fall risk. Baseline: 11/28 Goal status: INITIAL  4.  Pt will perform 360 turns in 12 steps or less, for improved balance with turns. Baseline:  Goal status: INITIAL  5.  Pt will report at least 50% improvement in low surface transfers. Baseline:  Goal status: INITIAL  6.  Pt will verbalize plans for  continued community fitness upon d/c from PT.  Baseline:  Goal status:  INITIAL    ASSESSMENT:  CLINICAL IMPRESSION: Patient is a 73 y.o. male who was seen today for physical therapy evaluation and treatment for Parkinson's disease.  He is known to this clinic and has been referred to PT due to 2 falls in the past 6 months.  He notes slowed mobility, decreased strength, and increased festinating/freezing episodes since last bout of therapy, ending 07/2023.  Pt presents today with decreased strength, decreased balance, decreased timing and coordination of gait, abnormal posture, postural instability, festination/freezing of gait.  He is at increased fall risk per TUG, MiniBEST and 3 M backwards walk scores.  He will benefit from skilled PT to improve balance, functional mobility, and to decrease fall risk.  OBJECTIVE IMPAIRMENTS: Abnormal gait, decreased balance, decreased mobility, difficulty walking, decreased strength, and postural dysfunction.   ACTIVITY LIMITATIONS: carrying, sitting, standing, transfers, locomotion level, and caring for others  PARTICIPATION LIMITATIONS: meal prep, cleaning, laundry, and community activity  PERSONAL FACTORS: 3+ comorbidities: See PMH above are also affecting patient's functional outcome.   REHAB POTENTIAL: Good  CLINICAL DECISION MAKING: Evolving/moderate complexity  EVALUATION COMPLEXITY: Moderate  PLAN:  PT FREQUENCY: 2x/week  PT DURATION: 8 weeks plus eval  PLANNED INTERVENTIONS: 97750- Physical Performance Testing, 97110-Therapeutic exercises, 97530- Therapeutic activity, 97112- Neuromuscular re-education, 97535- Self Care, 02859- Manual therapy, [phone]- Gait training, Patient/Family education, and Balance training  PLAN FOR NEXT SESSION: Initiate HEP-balance, functional strength, posture; work on turns, backwards walk, tips to decrease festination.   STARLET GREIG ORN., PT 05/04/2024, 5:48 PM  [facility] [address] [place], [address] [place], [place] [address] Phone # [phone] Fax # [phone]

## 2024-05-04 NOTE — Therapy (Signed)
 OUTPATIENT SPEECH LANGUAGE PATHOLOGY PARKINSON'S EVALUATION   Patient Name: [name] [name] MRN: [id number] DOB:05-Feb-1951, 73 y.o., male Today's Date: 05/04/2024  PCP: Frann Netter, MD REFERRING PROVIDER: Evonnie Stabs, DO  END OF SESSION:  End of Session - 05/04/24 2304     Visit Number 1    Number of Visits 17    Date for SLP Re-Evaluation 08/02/24    SLP Start Time 1621    SLP Stop Time  1701    SLP Time Calculation (min) 40 min    Activity Tolerance Patient tolerated treatment well           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/27/2014   Mild neurocognitive disorder due to Parkinson's disease 01/10/2022   Non-Hodgkin lymphoma 2007   diffuse- 6 cycles of chemo with R CHOP Rituxan - last dose 10/08   Nonspecific elevation of levels of transaminase or lactic acid dehydrogenase (LDH) 07/06/2008   Parkinson's disease 07/27/2014   Post-splenectomy 04/02/2015   Past Surgical History:  Procedure Laterality Date   CHOLECYSTECTOMY, LAPAROSCOPIC     EYE SURGERY     x2 as child   PORT-A-CATH REMOVAL     PORTACATH PLACEMENT     SPLENECTOMY     TONSILLECTOMY     Patient Active Problem List   Diagnosis Date Noted   Mild neurocognitive disorder due to Parkinson's disease 01/10/2022   Gastroesophageal reflux disease 03/07/2020   Localized swelling of right lower extremity 04/12/2019   DOE (dyspnea on exertion) 07/15/2018   10 year risk of MI or stroke 7.5% or greater 04/12/2018   Post-splenectomy 04/02/2015   Meige syndrome (blepharospasm with oromandibular dystonia)  07/27/2014   Dysphagia, neurologic 07/27/2014   Parkinson's disease (HCC) 07/27/2014   Dystonia 07/10/2014   Low back pain 11/21/2011   Nonspecific elevation of levels of transaminase or lactic acid dehydrogenase (LDH) 07/06/2008   Enlarged lymph nodes 07/28/2007   Non-Hodgkin lymphoma 03/23/2007    ONSET DATE: Dx in mid-2010's; script dated 04/12/24  REFERRING DIAG:  G20.A1 (ICD-10-CM) - Parkinson's disease without dyskinesia or fluctuating manifestations (HCC)      THERAPY DIAG:  Dysarthria and anarthria  Rationale for Evaluation and Treatment: Rehabilitation  SUBJECTIVE:   SUBJECTIVE STATEMENT:  She can't hear me much anymore.  Pt accompanied by: self  PERTINENT HISTORY: Meige syndrome, insomnia, RBD. Pt well known to this SLP - previous ST course d/c'd in January 2025 with pt having met all LTGs.   PAIN:  Are you having pain? No  FALLS: Has patient fallen in last 6 months?  No, See PT evaluation for details  LIVING ENVIRONMENT: Lives with: lives with their spouse Lives in: House/apartment  PLOF:  Level of assistance: Independent with ADLs, Independent with IADLs Employment: Retired  PATIENT GOALS: Improve with speech loudness   OBJECTIVE:  Note: Objective measures were completed at Evaluation unless otherwise noted.  DIAGNOSTIC FINDINGS:  From neuropsych evaluation report dated April 2023:  Mr. Ybarbo completed a comprehensive neuropsychological evaluation on 01/10/2022. Please refer to that encounter for the full report and recommendations. Briefly, results suggested isolated impaired performances across  complex attention and semantic fluency. Performance variability was also exhibited across all aspects of learning and memory. Regarding etiology, the most likely culprit for ongoing cognitive weakness remains his past history of Parkinson's disease. While processing speed was improved relative to what might be expected, dysfunction surrounding complex  attention and encoding/retrieval deficits of memory are quite common in this illness. The fact that motor/gait dysfunction was noted years prior to concern surrounding cognitive decline is also a typical timeline associated with Parkinson's disease. Mild anxiety and sleep dysfunction could further influence cognitive performances. Research surrounding cognitive side effects of pramipexole  is mixed; however, I cannot rule out an ongoing side effect contribution as well.   COGNITION: Overall cognitive status: Impaired Areas of impairment: Attention and Memory Comments: reports intermittent difficulty with divided attention; Compensates and uses alternating attention instead. He continues to use memory strategies from previous therapy courses, successfully.   MOTOR SPEECH: Overall motor speech: impaired Level of impairment: Word Respiration: thoracic breathing, clavicular breathing, and speaking on residual capacity Phonation: low vocal intensity low-mid 60s dB Resonance: WFL Articulation: Impaired: conversation  Intelligibility: Intelligibility reduced ;85-90% (less than previous evaluation which was 95%); less if listener not facing pt Effective technique: increased vocal intensity  ORAL MOTOR EXAMINATION: Overall status: Impaired: Labial: Bilateral (ROM and Coordination) Lingual: Bilateral (ROM and Coordination) Facial: Left (Symmetry) Velum: Coordination (minimally sluggish) Comments: left eyelid and cheek musculature slightly drooped   OBJECTIVE VOICE ASSESSMENT: Sustained ah maximum phonation time: 11.77 seconds Sustained ah loudness average: 86 dB Oral reading (passage) loudness average: 77 dB (everyday sentences) Oral reading loudness range: 63-86 dB Conversational loudness average: 63 dB Conversational loudness range: 52 - 72 dB Voice quality: breathy, strained, and aphonic Stimulability trials: Given SLP modeling and rare min cues, loudness average increased to 69dB (range  of 53 to 78) in 35 seconds of a monologue about [employer]. After that time loudness decreased to average 67dB Comments: Pt was successful with cues very much like his last evaluation in late 2024-early 2025. SLP suspects pt will have success in improving speech loudness during a new course of ST treatment.  Completed audio recording of patients baseline voice without cueing from SLP: Yes  Pt does not report difficulty with swallowing which does not warrant further evaluation.  PATIENT REPORTED OUTCOME MEASURES (PROM): Communication Effectiveness Survey: provided in first therapy session  TODAY'S TREATMENT:                                                                                                                                         DATE:  05/04/24: SLP worked with pt with iPad to incr awareness of lower than WNL voice. Pt used granddad shouting voice to get more intentional speech with louder, WNL volume speech. SLP collaborated pt with reps of loud /a/ and his everyday sentences; SLP verified pt's everyday sentences would all still be pertinent/relevant - pt to change  two (Alexa). SLP told him to pay close attention to things he says until next session and substitute another sentence into his list.  PATIENT EDUCATION: Education details: Review eval results, everyday sentence review, and prognosis for recovering speech loudness Person educated: Patient Education method: Explanation Education comprehension: verbalized understanding  HOME EXERCISE PROGRAM: Loud /a/ and everyday sentences, along with speech tasks matching ability level   GOALS: Goals reviewed with patient? Yes  SHORT TERM GOALS: Target date: 07/01/24 (first ST not until week of 06/06/24)  Pt will produce loud /a/ with at least low 90s dB average over three sessions  Baseline: Goal status: INITIAL  2.  Pt will produce 16/20 sentence responses with 70dB over two sessions  Baseline:  Goal status:  INITIAL  3.  Pt will produce volume average 69dB in 3 minutes simple conversation with rare min A over three sessions  Baseline:  Goal status: INITIAL  4.  Pt will generate abdominal breathing 80% of the time when engaging in 3 minutes simple conversation in 2 sessions  Baseline:  Goal status: INITIAL   LONG TERM GOALS: Target date: 08/02/24  Pt will maintain average 69dB over 8 minute simple-mod complex conversation with rare min A in three sessions  Baseline:  Goal status: INITIAL  2.  Pt will remain 100% intelligible out of the speech room for 8 minute conversation, with rare nonverbal cues Baseline:  Goal status: INITIAL  3.  pt will use abdominal breathing 70% of the time in 8 minutes conversation in three sessions  Baseline:  Goal status: INITIAL  4.  Pt will score higher on CES than initial administration  Baseline:  Goal status: INITIAL   ASSESSMENT:  CLINICAL IMPRESSION: Patient is a 73 y.o. male who was seen today for assessment of speech clarity who demonstrated dysarthria most c/b severely reduced breath support due to Parkinson's disease. When asked to produce speech with increased intent, he improved volume in 1 minutes of simple conversation however volume decline noted after approx 30 seconds. Pt would benefit from skilled ST targeting loudness in conversation. At this time he does not endorse swallowing difficulty but SLP will monitor informally throughout therapy course and goal/s added PRN. Pt continues to use memory compensations targeted in previous therapy courses. Pt desires to begin ST in approx one month to provide time to focus on PT prior to initiating ST.  OBJECTIVE IMPAIRMENTS: Objective impairments include attention, memory, and dysarthria. These impairments are limiting patient from ADLs/IADLs and effectively communicating at home and in community.Factors affecting potential to achieve goals and functional outcome are previous level of function and  severity of impairments.. Patient will benefit from skilled SLP services to address above impairments and improve overall function.  REHAB POTENTIAL: Good  PLAN:  SLP FREQUENCY: 2x/week  SLP DURATION: 8 weeks  PLANNED INTERVENTIONS: 92507 Treatment of speech (30 or 45 min) , Environmental controls, Cueing hierachy, Internal/external aids, Functional tasks, Multimodal communication approach, SLP instruction and feedback, Compensatory strategies, and Patient/family education    [facility], CCC-SLP 05/04/2024, 11:05 PM

## 2024-05-12 ENCOUNTER — Encounter: Payer: Self-pay | Admitting: Physical Therapy

## 2024-05-12 ENCOUNTER — Ambulatory Visit: Admitting: Physical Therapy

## 2024-05-12 DIAGNOSIS — R2689 Other abnormalities of gait and mobility: Secondary | ICD-10-CM | POA: Diagnosis not present

## 2024-05-12 DIAGNOSIS — R293 Abnormal posture: Secondary | ICD-10-CM | POA: Diagnosis not present

## 2024-05-12 DIAGNOSIS — R471 Dysarthria and anarthria: Secondary | ICD-10-CM | POA: Diagnosis not present

## 2024-05-12 DIAGNOSIS — R29818 Other symptoms and signs involving the nervous system: Secondary | ICD-10-CM | POA: Diagnosis not present

## 2024-05-12 DIAGNOSIS — M6281 Muscle weakness (generalized): Secondary | ICD-10-CM

## 2024-05-12 DIAGNOSIS — R2681 Unsteadiness on feet: Secondary | ICD-10-CM | POA: Diagnosis not present

## 2024-05-12 NOTE — Therapy (Signed)
 OUTPATIENT PHYSICAL THERAPY NEURO TREATMENT NOTE   Patient Name: [name] [name] MRN: [id number] DOB:Apr 11, 1951, 73 y.o., male Today's Date: 05/12/2024   PCP: Frann REFERRING PROVIDER: Evonnie Asberry RAMAN, DO   END OF SESSION:  PT End of Session - 05/12/24 1443     Visit Number 2    Number of Visits 18    Date for PT Re-Evaluation 07/01/24    Authorization Type Medicare/BCBS    Progress Note Due on Visit 10    PT Start Time 1445    PT Stop Time 1528    PT Time Calculation (min) 43 min    Activity Tolerance Patient tolerated treatment well    Behavior During Therapy [facility] for tasks assessed/performed           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/27/2014   Mild neurocognitive disorder due to Parkinson's disease 01/10/2022   Non-Hodgkin lymphoma 2007   diffuse- 6 cycles of chemo with R CHOP Rituxan - last dose 10/08   Nonspecific elevation of levels of transaminase or lactic acid dehydrogenase (LDH) 07/06/2008   Parkinson's disease 07/27/2014   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/02/2015   Past Surgical History:  Procedure Laterality Date   CHOLECYSTECTOMY, LAPAROSCOPIC     EYE SURGERY     x2 as child   PORT-A-CATH REMOVAL     PORTACATH PLACEMENT     SPLENECTOMY     TONSILLECTOMY     Patient Active Problem List   Diagnosis Date Noted   Mild neurocognitive disorder due to Parkinson's disease 01/10/2022   Gastroesophageal reflux disease 03/07/2020   Localized swelling of right lower extremity 04/12/2019   DOE (dyspnea on exertion) 07/15/2018   10 year risk of MI or stroke 7.5% or greater  04/12/2018   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/02/2015   Meige syndrome (blepharospasm with oromandibular dystonia) 07/27/2014   Dysphagia, neurologic 07/27/2014   Parkinson's disease (HCC) 07/27/2014   Dystonia 07/10/2014   Low back pain 11/21/2011   Nonspecific elevation of levels of transaminase or lactic acid dehydrogenase (LDH) 07/06/2008   Enlarged lymph nodes 07/28/2007   Non-Hodgkin lymphoma 03/23/2007    ONSET DATE: 04/12/2024  REFERRING DIAG:  G20.A2 (ICD-10-CM) - Parkinson's disease without dyskinesia, with fluctuating manifestations (HCC)  R49.8 (ICD-10-CM) - Hypophonia  R47.1 (ICD-10-CM) - Dysarthria    THERAPY DIAG:  Unsteadiness on feet  Other abnormalities of gait and mobility  Muscle weakness (generalized)  Rationale for Evaluation and Treatment: Rehabilitation  SUBJECTIVE:  SUBJECTIVE STATEMENT: Nothing new, no new falls.   Pt accompanied by: self  PERTINENT HISTORY: Hx of Meige syndrome, insomnia, RBD, PD, GERD, MCI  PAIN:  Are you having pain? Yes: NPRS scale: 1-2/10 Pain location: L hip/buttocks pain Pain description: sore Aggravating factors: lifting leg Relieving factors: sitting  PRECAUTIONS: Fall  RED FLAGS: None   WEIGHT BEARING RESTRICTIONS: No  FALLS: Has patient fallen in last 6 months? Yes. Number of falls 2  LIVING ENVIRONMENT: Lives with: lives with their family Lives in: House/apartment Stairs: 3 main sets of stairs; bilateral rails for first set; L rails going up to third floor hobby room Has following equipment at home: Single point cane and with small quad tip base; Has RW, but doesn't use  PLOF: Independent with household mobility with device and Independent with community mobility with device  PATIENT GOALS: To get back to where I was  OBJECTIVE:     TODAY'S TREATMENT: 05/12/2024 Activity Comments  Sit to stand, 3x 5 reps Slow, sustained positions, then quick, forward lean with momentum, cues for slowed descent to sit  LAQ 2 x 10 reps Green band, cues for slowed pace, 3 sec hold  Sidestepping 10 ft x 3 reps 2# BLE   Alt step taps to 6 step, 10 reps 2# BLE  Forward step and weightshift over obstacle x 10 Side step and weightshift over obstacle x 10 Cues for foot clearance  NuStep, Level 5, 4 extremities x 8 minutes For increased amplitude/intensity; initially <60 SPM, cues to go >80 for 1 min bouts   PATIENT EDUCATION: Education details: Initiated HEP; discussed obtaining adjustable weight ankle weights for home, which pt ordered during session Person educated: Patient Education method: Explanation, Demonstration, and Handouts Education comprehension: verbalized understanding, returned demonstration, and needs further education  HOME EXERCISE PROGRAM: Access Code: [email or web address] URL: [email or web address] Date: 05/12/2024 Prepared by: [facility] - Outpatient  Rehab - [facility]  Program Notes 1.  Side step over obstacle (pool noodle, cane)-one foot out to the side, and then bring back into place.  Then repeat 10 reps2.  Forward step over obstacle (pool noodle or cane)-one foot forward, then bring back into place.  Then repeat 10 reps  Exercises - Seated Long Arc Quad with Ankle Weight  - 1 x daily - 7 x weekly - 3 sets - 10 reps - Sit to Stand with Arm Swing  - 1 x daily - 7 x weekly - 3 sets - 10 reps - Alternating Step Taps with Counter Support  - 1 x daily - 7 x weekly - 3 sets - 10 reps ---------------------------------------------- Note: Objective measures were completed at Evaluation unless otherwise noted.  DIAGNOSTIC FINDINGS: NA this episode  COGNITION: Overall cognitive status: Within functional limits for tasks assessed and History of cognitive impairments - at baseline   POSTURE: rounded  shoulders, forward head, posterior pelvic tilt, flexed trunk , and weight shift right  LOWER EXTREMITY ROM:   WFL seated  Active  Right Eval Left Eval  Hip flexion    Hip extension    Hip abduction    Hip adduction    Hip internal rotation    Hip external rotation    Knee flexion    Knee extension    Ankle dorsiflexion    Ankle plantarflexion    Ankle inversion    Ankle eversion     (Blank rows = not tested)  LOWER EXTREMITY MMT:    MMT Right Eval Left Eval  Hip flexion  4 4  Hip extension    Hip abduction 4 4  Hip adduction 4 4  Hip internal rotation    Hip external rotation    Knee flexion 4 4  Knee extension 4 4  Ankle dorsiflexion 4 4  Ankle plantarflexion    Ankle inversion    Ankle eversion    (Blank rows = not tested)  TRANSFERS: Sit to stand: Modified independence  Assistive device utilized: None     Stand to sit: Modified independence  Assistive device utilized: None     More difficult time getting up from soft,low surface-recliner, sofa, desk chair GAIT: Findings: Gait Characteristics: step through pattern, decreased step length- Right, decreased step length- Left, shuffling, festinating, narrow BOS, poor foot clearance- Right, and poor foot clearance- Left, Distance walked: 50 ft, Assistive device utilized:Single point cane, Level of assistance: SBA, and Comments: festination with turns, and difficulty turning fully to sit in chair   FUNCTIONAL TESTS:  5 times sit to stand: 9.72 sec with some posterior lean Timed up and go (TUG): 16.34 sec-decreased eccentric control to fully turn to sit 10 meter walk test: 11.1 sec = 2.95 ft/sec 360 turn R:  19 steps, 10.53 sec 360 turn L:  17 steps, 8.09 sec TUG manual:  18.06 sec-decreased eccentric control to sit TUG cognitive:  19.06 sec-decreased eccentric control, festination with turn to sit (nearly misses chair) 3 M walk back:  7.96 sec                                                                                                                                TREATMENT DATE: 05/04/2024    PATIENT EDUCATION: Education details: Eval results, POC Person educated: Patient Education method: Explanation Education comprehension: verbalized understanding  HOME EXERCISE PROGRAM: Not yet initiated  GOALS: Goals reviewed with patient? Yes  SHORT TERM GOALS: Target date: 06/03/2024  Pt will be independent with HEP for improved strength, balance, gait. Baseline: Goal status: INITIAL  2.  Pt will improve 5x sit<>stand to less than or equal to 10 sec, with no episodes of posterior lean, to demonstrate improved functional strength and transfer efficiency. Baseline: 9.72 sec and 2 episodes of posterior lean Goal status: INITIAL  3.  Pt will improve TUG score to less than or equal to 13.5 sec for decreased fall risk.  Baseline: 16.34 sec Goal status: INITIAL   LONG TERM GOALS: Target date: 07/01/2024  Pt will be independent with HEP for improved strength, balance, gait. Baseline:  Goal status: INITIAL  2.  Pt will improve TUG manual/TUG cognitive score to less than or equal to 15 sec for decreased fall risk. Baseline: 18/19 sec Goal status: INITIAL  3.  Pt will improve MiniBESTest score to at least 18/28 to decrease fall risk. Baseline: 11/28 Goal status: INITIAL  4.  Pt will perform 360 turns in 12 steps or less, for improved balance with turns. Baseline:  Goal status: INITIAL  5.  Pt will report at least 50% improvement in low surface transfers. Baseline:  Goal status: INITIAL  6.  Pt will verbalize plans for continued community fitness upon d/c from PT.  Baseline:  Goal status:  INITIAL    ASSESSMENT:  CLINICAL IMPRESSION: Pt presents today with no new complaints. Skilled PT session focused on strengthening-with seated leg exercise, sit to stand, and increased intensity/amplitude of movement with NuStep strengthening.  Also worked on balance for improved foot clearance,  improved timing/coordination for balance reactions.  He needs at times for slowed pace, control.  Pt will continue to benefit from skilled PT towards goals for improved functional mobility and decreased fall risk.   OBJECTIVE IMPAIRMENTS: Abnormal gait, decreased balance, decreased mobility, difficulty walking, decreased strength, and postural dysfunction.   ACTIVITY LIMITATIONS: carrying, sitting, standing, transfers, locomotion level, and caring for others  PARTICIPATION LIMITATIONS: meal prep, cleaning, laundry, and community activity  PERSONAL FACTORS: 3+ comorbidities: See PMH above are also affecting patient's functional outcome.   REHAB POTENTIAL: Good  CLINICAL DECISION MAKING: Evolving/moderate complexity  EVALUATION COMPLEXITY: Moderate  PLAN:  PT FREQUENCY: 2x/week  PT DURATION: 8 weeks plus eval  PLANNED INTERVENTIONS: 97750- Physical Performance Testing, 97110-Therapeutic exercises, 97530- Therapeutic activity, [id number]- Neuromuscular re-education, 97535- Self Care, 02859- Manual therapy, [phone]- Gait training, Patient/Family education, and Balance training  PLAN FOR NEXT SESSION: Review initial HEP and progress-balance, functional strength, posture; work on turns, backwards walk, tips to decrease festination.   STARLET GREIG ORN., PT 05/12/2024, 3:48 PM  [facility] at [facility] [address] [place], [address] [place], [place] [address] Phone # [phone] Fax # [phone]

## 2024-05-16 ENCOUNTER — Ambulatory Visit: Admitting: Physical Therapy

## 2024-05-16 ENCOUNTER — Encounter: Payer: Self-pay | Admitting: Physical Therapy

## 2024-05-16 DIAGNOSIS — R293 Abnormal posture: Secondary | ICD-10-CM | POA: Diagnosis not present

## 2024-05-16 DIAGNOSIS — R2689 Other abnormalities of gait and mobility: Secondary | ICD-10-CM

## 2024-05-16 DIAGNOSIS — M6281 Muscle weakness (generalized): Secondary | ICD-10-CM

## 2024-05-16 DIAGNOSIS — R2681 Unsteadiness on feet: Secondary | ICD-10-CM

## 2024-05-16 DIAGNOSIS — R471 Dysarthria and anarthria: Secondary | ICD-10-CM | POA: Diagnosis not present

## 2024-05-16 DIAGNOSIS — R29818 Other symptoms and signs involving the nervous system: Secondary | ICD-10-CM | POA: Diagnosis not present

## 2024-05-16 NOTE — Therapy (Signed)
 OUTPATIENT PHYSICAL THERAPY NEURO TREATMENT NOTE   Patient Name: [name] [name] MRN: [id number] DOB:[birth year], 73 y.o., male Today's Date: 05/16/2024   PCP: Frann REFERRING PROVIDER: Evonnie Asberry RAMAN, DO   END OF SESSION:  PT End of Session - 05/16/24 1446     Visit Number 3    Number of Visits 18    Date for PT Re-Evaluation 07/01/24    Authorization Type Medicare/BCBS    Progress Note Due on Visit 10    PT Start Time 1447    PT Stop Time 1530    PT Time Calculation (min) 43 min    Activity Tolerance Patient tolerated treatment well    Behavior During Therapy [facility] for tasks assessed/performed            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/27/2014   Mild neurocognitive disorder due to Parkinson's disease 01/10/2022   Non-Hodgkin lymphoma 2007   diffuse- 6 cycles of chemo with R CHOP Rituxan - last dose 10/08   Nonspecific elevation of levels of transaminase or lactic acid dehydrogenase (LDH) 07/06/2008   Parkinson's disease 07/27/2014   Post-splenectomy 04/02/2015   Past Surgical History:  Procedure Laterality Date   CHOLECYSTECTOMY, LAPAROSCOPIC     EYE SURGERY     x2 as child   PORT-A-CATH REMOVAL     PORTACATH PLACEMENT     SPLENECTOMY     TONSILLECTOMY     Patient Active Problem List   Diagnosis Date Noted   Mild neurocognitive disorder due to Parkinson's disease 01/10/2022   Gastroesophageal reflux disease 03/07/2020   Localized swelling of right lower extremity 04/12/2019   DOE (dyspnea on exertion) 07/15/2018   10 year risk of MI or stroke 7.5% or  greater 04/12/2018   Post-splenectomy 04/02/2015   Meige syndrome (blepharospasm with oromandibular dystonia) 07/27/2014   Dysphagia, neurologic 07/27/2014   Parkinson's disease (HCC) 07/27/2014   Dystonia 07/10/2014   Low back pain 11/21/2011   Nonspecific elevation of levels of transaminase or lactic acid dehydrogenase (LDH) 07/06/2008   Enlarged lymph nodes 07/28/2007   Non-Hodgkin lymphoma 03/23/2007    ONSET DATE: 04/12/2024  REFERRING DIAG:  G20.A2 (ICD-10-CM) - Parkinson's disease without dyskinesia, with fluctuating manifestations (HCC)  R49.8 (ICD-10-CM) - Hypophonia  R47.1 (ICD-10-CM) - Dysarthria    THERAPY DIAG:  Unsteadiness on feet  Other abnormalities of gait and mobility  Muscle weakness (generalized)  Rationale for Evaluation and Treatment: Rehabilitation  SUBJECTIVE:  SUBJECTIVE STATEMENT: No changes. Pt accompanied by: self  PERTINENT HISTORY: Hx of Meige syndrome, insomnia, RBD, PD, GERD, MCI  PAIN:  Are you having pain? Yes: NPRS scale: 2/10 Pain location: Lower back Pain description: sore Aggravating factors: sitting in car too long Relieving factors: sitting  PRECAUTIONS: Fall  RED FLAGS: None   WEIGHT BEARING RESTRICTIONS: No  FALLS: Has patient fallen in last 6 months? Yes. Number of falls 2  LIVING ENVIRONMENT: Lives with: lives with their family Lives in: House/apartment Stairs: 3 main sets of stairs; bilateral rails for first set; L rails going up to third floor hobby room Has following equipment at home: Single point cane and with small quad tip base; Has RW, but doesn't use  PLOF: Independent with household mobility with device and Independent with community mobility with device  PATIENT GOALS: To get back to where I was  OBJECTIVE:     TODAY'S TREATMENT: 05/16/2024 Activity Comments  NuStep, Level 3>7, 4 extremities x 10 minutes Easy/moderate/hard intervals per PWR! Moves interval training  Seated hamstring stretch, 2 x 30 sec Cues for technique  Standing gastroc stretch, runner's stretch x 30 sec   Standing gastroc stretch, foot propped on 4 step, 2 x 30 sec Cues for form  Seated PWR! Moves with yellow theraband x 10 For postural awareness  Sit to stand with PWR! Up x 5 reps Standing PWR! Rock    At [employer], at [employer], working on [employer] to initiate gait  PWR! Rock and weightshift for turns, with addition of short distance gait With cane, good attention to foot clearance for turn  HOME EXERCISE PROGRAM:  Access Code: [email or web address] URL: [email or web address] Date: 05/16/2024 Prepared by: [facility] - Outpatient  Rehab - [facility]  PWR! Up and PWR! Rock in standing 10 reps Program Notes 1.  Side step over obstacle (pool noodle, cane)-one foot out to the side, and then bring back into place.  Then repeat 10 reps2.  Forward step over obstacle (pool noodle or cane)-one foot forward, then bring back into place.  Then repeat 10 reps  Exercises - Seated Long Arc Quad with Ankle Weight  - 1 x daily - 7 x weekly - 3 sets - 10 reps - Sit to Stand with Arm Swing  - 1 x daily - 7 x weekly - 3 sets - 10 reps - Alternating Step Taps with Counter Support  - 1 x daily - 7 x weekly - 3 sets - 10 reps - Seated Hamstring Stretch  - 1 x daily - 7 x weekly - 1 sets - 3 reps - 30 sec hold - Standing Gastroc Stretch on Step  - 1 x daily - 7 x weekly - 1 sets - 3 reps - 30 sec hold  PATIENT EDUCATION: Education details: Progressed HEP; PWR! Moves for weigthshifting to initiate gait and turns; PD Fitness recommendations Person educated: Patient Education method: Explanation, Demonstration, and Handouts Education comprehension: verbalized understanding, returned demonstration, and needs further  education  ---------------------------------------------- Note: Objective measures were completed at Evaluation unless otherwise noted.  DIAGNOSTIC FINDINGS: NA this episode  COGNITION: Overall cognitive status: Within functional limits for tasks assessed and History of cognitive impairments - at baseline   POSTURE: rounded shoulders, forward head, posterior pelvic tilt, flexed trunk , and weight shift right  LOWER EXTREMITY ROM:   WFL seated  Active  Right Eval Left Eval  Hip flexion    Hip extension    Hip abduction    Hip adduction  Hip internal rotation    Hip external rotation    Knee flexion    Knee extension    Ankle dorsiflexion    Ankle plantarflexion    Ankle inversion    Ankle eversion     (Blank rows = not tested)  LOWER EXTREMITY MMT:    MMT Right Eval Left Eval  Hip flexion 4 4  Hip extension    Hip abduction 4 4  Hip adduction 4 4  Hip internal rotation    Hip external rotation    Knee flexion 4 4  Knee extension 4 4  Ankle dorsiflexion 4 4  Ankle plantarflexion    Ankle inversion    Ankle eversion    (Blank rows = not tested)  TRANSFERS: Sit to stand: Modified independence  Assistive device utilized: None     Stand to sit: Modified independence  Assistive device utilized: None     More difficult time getting up from soft,low surface-recliner, sofa, desk chair GAIT: Findings: Gait Characteristics: step through pattern, decreased step length- Right, decreased step length- Left, shuffling, festinating, narrow BOS, poor foot clearance- Right, and poor foot clearance- Left, Distance walked: 50 ft, Assistive device utilized:Single point cane, Level of assistance: SBA, and Comments: festination with turns, and difficulty turning fully to sit in chair   FUNCTIONAL TESTS:  5 times sit to stand: 9.72 sec with some posterior lean Timed up and go (TUG): 16.34 sec-decreased eccentric control to fully turn to sit 10 meter walk test: 11.1 sec = 2.95  ft/sec 360 turn R:  19 steps, 10.53 sec 360 turn L:  17 steps, 8.09 sec TUG manual:  18.06 sec-decreased eccentric control to sit TUG cognitive:  19.06 sec-decreased eccentric control, festination with turn to sit (nearly misses chair) 3 M walk back:  7.96 sec                                                                                                                               TREATMENT DATE: 05/04/2024    PATIENT EDUCATION: Education details: Eval results, POC Person educated: Patient Education method: Explanation Education comprehension: verbalized understanding  HOME EXERCISE PROGRAM: Not yet initiated  GOALS: Goals reviewed with patient? Yes  SHORT TERM GOALS: Target date: 06/03/2024  Pt will be independent with HEP for improved strength, balance, gait. Baseline: Goal status: IN PROGRESS  2.  Pt will improve 5x sit<>stand to less than or equal to 10 sec, with no episodes of posterior lean, to demonstrate improved functional strength and transfer efficiency. Baseline: 9.72 sec and 2 episodes of posterior lean Goal status: IN PROGRESS  3.  Pt will improve TUG score to less than or equal to 13.5 sec for decreased fall risk.  Baseline: 16.34 sec Goal status: IN PROGRESS   LONG TERM GOALS: Target date: 07/01/2024  Pt will be independent with HEP for improved strength, balance, gait. Baseline:  Goal status: IN PROGRESS  2.  Pt will improve  TUG manual/TUG cognitive score to less than or equal to 15 sec for decreased fall risk. Baseline: 18/19 sec Goal status: IN PROGRESS  3.  Pt will improve MiniBESTest score to at least 18/28 to decrease fall risk. Baseline: 11/28 Goal status: IN PROGRESS  4.  Pt will perform 360 turns in 12 steps or less, for improved balance with turns. Baseline:  Goal status: IN PROGRESS  5.  Pt will report at least 50% improvement in low surface transfers. Baseline:  Goal status: IN PROGRESS  6.  Pt will verbalize plans for  continued community fitness upon d/c from PT.  Baseline:  Goal status: IN PROGRESS    ASSESSMENT:  CLINICAL IMPRESSION: Pt presents today with no new complaints.  Skilled PT session focused on aerobic warm up with PWR! Moves interval training on NuStep, then flexibility/strengthening, then some posture/balance work. Worked on these groups of exercises based on PD [employer] Recommendations.  Utilized PWR! Moves (PWR! Up in sitting as well as standing) and PWR! Rock for weightshifting to help improve initiation of gait and turns. He does a good job incorporating these moves into walking and turns; he will likely need additional practice and repetition. Pt will continue to benefit from skilled PT towards goals for improved functional mobility and decreased fall risk.   OBJECTIVE IMPAIRMENTS: Abnormal gait, decreased balance, decreased mobility, difficulty walking, decreased strength, and postural dysfunction.   ACTIVITY LIMITATIONS: carrying, sitting, standing, transfers, locomotion level, and caring for others  PARTICIPATION LIMITATIONS: meal prep, cleaning, laundry, and community activity  PERSONAL FACTORS: 3+ comorbidities: See PMH above are also affecting patient's functional outcome.   REHAB POTENTIAL: Good  CLINICAL DECISION MAKING: Evolving/moderate complexity  EVALUATION COMPLEXITY: Moderate  PLAN:  PT FREQUENCY: 2x/week  PT DURATION: 8 weeks plus eval  PLANNED INTERVENTIONS: 97750- Physical Performance Testing, 97110-Therapeutic exercises, 97530- Therapeutic activity, 97112- Neuromuscular re-education, 97535- Self Care, 02859- Manual therapy, [phone]- Gait training, Patient/Family education, and Balance training  PLAN FOR NEXT SESSION: Review updates to HEP and progress-balance, functional strength, posture; work on turns, backwards walk, tips to decrease festination.   STARLET GREIG ORN., PT 05/16/2024, 4:27 PM  [facility] at [facility] [address] [place], [address] [place], [place] [address] Phone # [phone] Fax # [phone]

## 2024-05-18 ENCOUNTER — Ambulatory Visit: Admitting: Physical Therapy

## 2024-05-18 ENCOUNTER — Encounter: Payer: Self-pay | Admitting: Physical Therapy

## 2024-05-18 DIAGNOSIS — R2681 Unsteadiness on feet: Secondary | ICD-10-CM | POA: Diagnosis not present

## 2024-05-18 DIAGNOSIS — R471 Dysarthria and anarthria: Secondary | ICD-10-CM | POA: Diagnosis not present

## 2024-05-18 DIAGNOSIS — R2689 Other abnormalities of gait and mobility: Secondary | ICD-10-CM

## 2024-05-18 DIAGNOSIS — M6281 Muscle weakness (generalized): Secondary | ICD-10-CM | POA: Diagnosis not present

## 2024-05-18 DIAGNOSIS — R293 Abnormal posture: Secondary | ICD-10-CM | POA: Diagnosis not present

## 2024-05-18 DIAGNOSIS — R29818 Other symptoms and signs involving the nervous system: Secondary | ICD-10-CM | POA: Diagnosis not present

## 2024-05-18 NOTE — Therapy (Signed)
 OUTPATIENT PHYSICAL THERAPY NEURO TREATMENT NOTE   Patient Name: [name] [name] MRN: [id number] DOB:June 15, 1951, 73 y.o., male Today's Date: 05/18/2024   PCP: Frann REFERRING PROVIDER: Evonnie Asberry RAMAN, DO   END OF SESSION:  PT End of Session - 05/18/24 1445     Visit Number 4    Number of Visits 18    Date for PT Re-Evaluation 07/01/24    Authorization Type Medicare/BCBS    Progress Note Due on Visit 10    PT Start Time 1446    PT Stop Time 1529    PT Time Calculation (min) 43 min    Activity Tolerance Patient tolerated treatment well    Behavior During Therapy [facility] for tasks assessed/performed             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/27/2014   Mild neurocognitive disorder due to Parkinson's disease 01/10/2022   Non-Hodgkin lymphoma 2007   diffuse- 6 cycles of chemo with R CHOP Rituxan - last dose 10/08   Nonspecific elevation of levels of transaminase or lactic acid dehydrogenase (LDH) 07/06/2008   Parkinson's disease 07/27/2014   Post-splenectomy 04/02/2015   Past Surgical History:  Procedure Laterality Date   CHOLECYSTECTOMY, LAPAROSCOPIC     EYE SURGERY     x2 as child   PORT-A-CATH REMOVAL     PORTACATH PLACEMENT     SPLENECTOMY     TONSILLECTOMY     Patient Active Problem List   Diagnosis Date Noted   Mild neurocognitive disorder due to Parkinson's disease 01/10/2022   Gastroesophageal reflux disease 03/07/2020   Localized swelling of right lower extremity 04/12/2019   DOE (dyspnea on exertion) 07/15/2018   10 year risk of MI or stroke 7.5% or  greater 04/12/2018   Post-splenectomy 04/02/2015   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/27/2014   Dysphagia, neurologic 07/27/2014   Parkinson's disease (HCC) 07/27/2014   Dystonia 07/10/2014   Low back pain 11/21/2011   Nonspecific elevation of levels of transaminase or lactic acid dehydrogenase (LDH) 07/06/2008   Enlarged lymph nodes 07/28/2007   Non-Hodgkin lymphoma 03/23/2007    ONSET DATE: 04/12/2024  REFERRING DIAG:  G20.A2 (ICD-10-CM) - Parkinson's disease without dyskinesia, with fluctuating manifestations (HCC)  R49.8 (ICD-10-CM) - Hypophonia  R47.1 (ICD-10-CM) - Dysarthria    THERAPY DIAG:  Unsteadiness on feet  Other abnormalities of gait and mobility  Muscle weakness (generalized)  Rationale for Evaluation and Treatment: Rehabilitation  SUBJECTIVE:  SUBJECTIVE STATEMENT: Doing okay-have had a hard time with computer stuff.  Had a long night the last couple of nights.  Off of my medication schedule today. Pt accompanied by: self  PERTINENT HISTORY: Hx of Meige syndrome, insomnia, RBD, PD, GERD, MCI  PAIN:  Are you having pain? Yes: NPRS scale: 5/10 Pain location: Lower back Pain description: sore Aggravating factors: sitting in car too long Relieving factors: sitting  PRECAUTIONS: Fall; hypophonia  RED FLAGS: None   WEIGHT BEARING RESTRICTIONS: No  FALLS: Has patient fallen in last 6 months? Yes. Number of falls 2  LIVING ENVIRONMENT: Lives with: lives with their family Lives in: House/apartment Stairs: 3 main sets of stairs; bilateral rails for first set; L rails going up to third floor hobby room Has following equipment at home: Single point cane and with small quad tip base; Has RW, but doesn't use  PLOF: Independent with household  mobility with device and Independent with community mobility with device  PATIENT GOALS: To get back to where I was  OBJECTIVE:    TODAY'S TREATMENT: 05/18/2024 Activity Comments  NuStep, Level 4, 4 extremities x 8 minutes SPM 60's self-selected pace; >80 for 30-60 sec intervals  Sit to stand x 5 reps, 2 sets  From low/sofa-like surface-cues for forward lean, increased momentum and slowed pace to sit  Sit to stand with PWR! UP + Rock + forward step 3 reps each leg Good form  Sit to stand with PWR! UP + Rock + step and short distance gait/short distance rock/step turn Added cognitive task slows pace, decreased foot clearance  Forward/back walking along counter, 2 x 2 minutes 2nd set with 2# weight at ankles-better pace and more foot clearance with weights  Forward step over obstacle>back step and weightshift 2# at ankles, 10 reps each More unsteady with LLE as stance  Side step over obstacle x 10 2# at ankles       TODAY'S TREATMENT: 05/16/2024 Activity Comments  NuStep, Level 3>7, 4 extremities x 10 minutes Easy/moderate/hard intervals per PWR! Moves interval training  Seated hamstring stretch, 2 x 30 sec Cues for technique  Standing gastroc stretch, runner's stretch x 30 sec   Standing gastroc stretch, foot propped on 4 step, 2 x 30 sec Cues for form  Seated PWR! Moves with yellow theraband x 10 For postural awareness  Sit to stand with PWR! Up x 5 reps Standing PWR! Rock    At [employer], at [employer], working on [employer] to initiate gait  PWR! Rock and weightshift for turns, with addition of short distance gait With cane, good attention to foot clearance for turn  HOME EXERCISE PROGRAM:  Access Code: [email or web address] URL: [email or web address] Date: 05/16/2024 Prepared by: [facility] - Outpatient  Rehab - [facility]  PWR! Up and PWR! Rock in standing 10 reps Program Notes 1.  Side step over obstacle (pool noodle, cane)-one foot out to the side, and then bring back into  place.  Then repeat 10 reps2.  Forward step over obstacle (pool noodle or cane)-one foot forward, then bring back into place.  Then repeat 10 reps  Exercises - Seated Long Arc Quad with Ankle Weight  - 1 x daily - 7 x weekly - 3 sets - 10 reps - Sit to Stand with Arm Swing  - 1 x daily - 7 x weekly - 3 sets - 10 reps - Alternating Step Taps with Counter Support  - 1 x daily - 7 x weekly - 3 sets - 10 reps -  Seated Hamstring Stretch  - 1 x daily - 7 x weekly - 1 sets - 3 reps - 30 sec hold - Standing Gastroc Stretch on Step  - 1 x daily - 7 x weekly - 1 sets - 3 reps - 30 sec hold  PATIENT EDUCATION: Education details: PWR! Moves for transfers, weigthshifting, step to initiate gait and turns Person educated: Patient Education method: Explanation, Demonstration, and Handouts Education comprehension: verbalized understanding, returned demonstration, and needs further education  ---------------------------------------------- Note: Objective measures were completed at Evaluation unless otherwise noted.  DIAGNOSTIC FINDINGS: NA this episode  COGNITION: Overall cognitive status: Within functional limits for tasks assessed and History of cognitive impairments - at baseline   POSTURE: rounded shoulders, forward head, posterior pelvic tilt, flexed trunk , and weight shift right  LOWER EXTREMITY ROM:   WFL seated  Active  Right Eval Left Eval  Hip flexion    Hip extension    Hip abduction    Hip adduction    Hip internal rotation    Hip external rotation    Knee flexion    Knee extension    Ankle dorsiflexion    Ankle plantarflexion    Ankle inversion    Ankle eversion     (Blank rows = not tested)  LOWER EXTREMITY MMT:    MMT Right Eval Left Eval  Hip flexion 4 4  Hip extension    Hip abduction 4 4  Hip adduction 4 4  Hip internal rotation    Hip external rotation    Knee flexion 4 4  Knee extension 4 4  Ankle dorsiflexion 4 4  Ankle plantarflexion    Ankle inversion     Ankle eversion    (Blank rows = not tested)  TRANSFERS: Sit to stand: Modified independence  Assistive device utilized: None     Stand to sit: Modified independence  Assistive device utilized: None     More difficult time getting up from soft,low surface-recliner, sofa, desk chair GAIT: Findings: Gait Characteristics: step through pattern, decreased step length- Right, decreased step length- Left, shuffling, festinating, narrow BOS, poor foot clearance- Right, and poor foot clearance- Left, Distance walked: 50 ft, Assistive device utilized:Single point cane, Level of assistance: SBA, and Comments: festination with turns, and difficulty turning fully to sit in chair   FUNCTIONAL TESTS:  5 times sit to stand: 9.72 sec with some posterior lean Timed up and go (TUG): 16.34 sec-decreased eccentric control to fully turn to sit 10 meter walk test: 11.1 sec = 2.95 ft/sec 360 turn R:  19 steps, 10.53 sec 360 turn L:  17 steps, 8.09 sec TUG manual:  18.06 sec-decreased eccentric control to sit TUG cognitive:  19.06 sec-decreased eccentric control, festination with turn to sit (nearly misses chair) 3 M walk back:  7.96 sec  TREATMENT DATE: 05/04/2024    PATIENT EDUCATION: Education details: Eval results, POC Person educated: Patient Education method: Explanation Education comprehension: verbalized understanding  HOME EXERCISE PROGRAM: Not yet initiated  GOALS: Goals reviewed with patient? Yes  SHORT TERM GOALS: Target date: 06/03/2024  Pt will be independent with HEP for improved strength, balance, gait. Baseline: Goal status: IN PROGRESS  2.  Pt will improve 5x sit<>stand to less than or equal to 10 sec, with no episodes of posterior lean, to demonstrate improved functional strength and transfer efficiency. Baseline: 9.72 sec and 2 episodes of posterior  lean Goal status: IN PROGRESS  3.  Pt will improve TUG score to less than or equal to 13.5 sec for decreased fall risk.  Baseline: 16.34 sec Goal status: IN PROGRESS   LONG TERM GOALS: Target date: 07/01/2024  Pt will be independent with HEP for improved strength, balance, gait. Baseline:  Goal status: IN PROGRESS  2.  Pt will improve TUG manual/TUG cognitive score to less than or equal to 15 sec for decreased fall risk. Baseline: 18/19 sec Goal status: IN PROGRESS  3.  Pt will improve MiniBESTest score to at least 18/28 to decrease fall risk. Baseline: 11/28 Goal status: IN PROGRESS  4.  Pt will perform 360 turns in 12 steps or less, for improved balance with turns. Baseline:  Goal status: IN PROGRESS  5.  Pt will report at least 50% improvement in low surface transfers. Baseline:  Goal status: IN PROGRESS  6.  Pt will verbalize plans for continued community fitness upon d/c from PT.  Baseline:  Goal status: IN PROGRESS    ASSESSMENT:  CLINICAL IMPRESSION: Pt presents today with reports of fatigue and being off schedule of medications due to some computer issues at home, causing pt to stay up late/get less sleep.  Skilled PT session focused on aerobic warm up + practice for sit to stand from varied surfaces.  Then progressed to sit to stand + rocking +deliberate step to initiate gait; worked on weightshift turns.  Pt does a good job with this, but does slow pace and have decreased foot clearance with added cognitive task.  He will continue to benefit from skilled PT towards goals for improved functional mobility and decreased fall risk.   OBJECTIVE IMPAIRMENTS: Abnormal gait, decreased balance, decreased mobility, difficulty walking, decreased strength, and postural dysfunction.   ACTIVITY LIMITATIONS: carrying, sitting, standing, transfers, locomotion level, and caring for others  PARTICIPATION LIMITATIONS: meal prep, cleaning, laundry, and community  activity  PERSONAL FACTORS: 3+ comorbidities: See PMH above are also affecting patient's functional outcome.   REHAB POTENTIAL: Good  CLINICAL DECISION MAKING: Evolving/moderate complexity  EVALUATION COMPLEXITY: Moderate  PLAN:  PT FREQUENCY: 2x/week  PT DURATION: 8 weeks plus eval  PLANNED INTERVENTIONS: 97750- Physical Performance Testing, 97110-Therapeutic exercises, 97530- Therapeutic activity, [id number]- Neuromuscular re-education, 97535- Self Care, [id number]- Manual therapy, [phone]- Gait training, Patient/Family education, and Balance training  PLAN FOR NEXT SESSION: Progress balance exercises, functional strength, posture; work on turns, backwards walk, tips to decrease festination.   STARLET GREIG ORN., PT 05/18/2024, 3:35 PM  [facility] [address], [address] [place], [place] [address] Phone # [phone] Fax # [phone]

## 2024-05-24 ENCOUNTER — Ambulatory Visit: Attending: Neurology | Admitting: Physical Therapy

## 2024-05-24 ENCOUNTER — Encounter: Payer: Self-pay | Admitting: Physical Therapy

## 2024-05-24 DIAGNOSIS — R2681 Unsteadiness on feet: Secondary | ICD-10-CM | POA: Insufficient documentation

## 2024-05-24 DIAGNOSIS — M6281 Muscle weakness (generalized): Secondary | ICD-10-CM | POA: Diagnosis present

## 2024-05-24 DIAGNOSIS — R2689 Other abnormalities of gait and mobility: Secondary | ICD-10-CM | POA: Diagnosis present

## 2024-05-24 DIAGNOSIS — R29818 Other symptoms and signs involving the nervous system: Secondary | ICD-10-CM | POA: Insufficient documentation

## 2024-05-24 DIAGNOSIS — G20A1 Parkinson's disease without dyskinesia, without mention of fluctuations: Secondary | ICD-10-CM | POA: Diagnosis present

## 2024-05-24 DIAGNOSIS — R293 Abnormal posture: Secondary | ICD-10-CM | POA: Insufficient documentation

## 2024-05-24 NOTE — Therapy (Signed)
 OUTPATIENT PHYSICAL THERAPY NEURO TREATMENT NOTE   Patient Name: [name] [name] MRN: [id number] DOB:11/01/1950, 73 y.o., male Today's Date: 05/24/2024   PCP: Frann REFERRING PROVIDER: Evonnie Asberry RAMAN, DO   END OF SESSION:  PT End of Session - 05/24/24 1403     Visit Number 5    Number of Visits 18    Date for PT Re-Evaluation 07/01/24    Authorization Type Medicare/BCBS    Progress Note Due on Visit 10    PT Start Time 1403    PT Stop Time 1441    PT Time Calculation (min) 38 min    Activity Tolerance Patient tolerated treatment well    Behavior During Therapy [facility] for tasks assessed/performed              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/27/2014   Mild neurocognitive disorder due to Parkinson's disease 01/10/2022   Non-Hodgkin lymphoma 2007   diffuse- 6 cycles of chemo with R CHOP Rituxan - last dose 10/08   Nonspecific elevation of levels of transaminase or lactic acid dehydrogenase (LDH) 07/06/2008   Parkinson's disease 07/27/2014   Post-splenectomy 04/02/2015   Past Surgical History:  Procedure Laterality Date   CHOLECYSTECTOMY, LAPAROSCOPIC     EYE SURGERY     x2 as child   PORT-A-CATH REMOVAL     PORTACATH PLACEMENT     SPLENECTOMY     TONSILLECTOMY     Patient Active Problem List   Diagnosis Date Noted   Mild neurocognitive disorder due to Parkinson's disease 01/10/2022   Gastroesophageal reflux disease 03/07/2020   Localized swelling of right lower extremity 04/12/2019   DOE (dyspnea on exertion) 07/15/2018   10 year risk of MI or stroke 7.5% or  greater 04/12/2018   Post-splenectomy 04/02/2015   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/27/2014   Dysphagia, neurologic 07/27/2014   Parkinson's disease (HCC) 07/27/2014   Dystonia 07/10/2014   Low back pain 11/21/2011   Nonspecific elevation of levels of transaminase or lactic acid dehydrogenase (LDH) 07/06/2008   Enlarged lymph nodes 07/28/2007   Non-Hodgkin lymphoma 03/23/2007    ONSET DATE: 04/12/2024  REFERRING DIAG:  G20.A2 (ICD-10-CM) - Parkinson's disease without dyskinesia, with fluctuating manifestations (HCC)  R49.8 (ICD-10-CM) - Hypophonia  R47.1 (ICD-10-CM) - Dysarthria    THERAPY DIAG:  Unsteadiness on feet  Other abnormalities of gait and mobility  Muscle weakness (generalized)  Abnormal posture  Other symptoms and signs involving the nervous system  Parkinson's disease without dyskinesia or fluctuating manifestations (HCC)  Rationale for Evaluation and Treatment: Rehabilitation  SUBJECTIVE:  SUBJECTIVE STATEMENT: Feeling pretty good. Reports no issues. Pt states he has not been doing the exercises daily. Pt reports he did walk some on Sunday going to the ball game.  Pt accompanied by: self  PERTINENT HISTORY: Hx of Meige syndrome, insomnia, RBD, PD, GERD, MCI  PAIN:  Are you having pain? Yes: NPRS scale: 0/10 Pain location: Lower back Pain description: sore Aggravating factors: sitting in car too long Relieving factors: sitting  PRECAUTIONS: Fall; hypophonia  RED FLAGS: None   WEIGHT BEARING RESTRICTIONS: No  FALLS: Has patient fallen in last 6 months? Yes. Number of falls 2  LIVING ENVIRONMENT: Lives with: lives with their family Lives in: House/apartment Stairs: 3 main sets of stairs; bilateral rails for first set; L rails going  up to third floor hobby room Has following equipment at home: Single point cane and with small quad tip base; Has RW, but doesn't use  PLOF: Independent with household mobility with device and Independent with community mobility with device  PATIENT GOALS: To get back to where I was  OBJECTIVE:  TODAY'S TREATMENT: 05/24/2024 Activity Comments  NuStep, Level 4, 4 extremities x 8 minutes SPM 60's self-selected pace; >80 for 30-60 sec intervals  Hands on cabinets sliding up for trunk ext x10 Low trap setting x10   Sit to stand with PWR! UP + Rock reaching to cabinets Good form  Sit to stand with PWR! UP + side step reaching to cabinet  Cognitive tasks touching various post it notes with numbers  Sit to stand with PWR!UP + fwd step reach to cabinet Cognitive tasks touching various post it notes with numbers  Diagonals 3# 2x10   Forward/back walking along counter, x 2 minutes   Cariocas x 2 minutes   Seated thoracic ext x10 Seated thoracic ext + rotation x10        TODAY'S TREATMENT: 05/18/2024 Activity Comments  NuStep, Level 4, 4 extremities x 8 minutes SPM 60's self-selected pace; >80 for 30-60 sec intervals  Sit to stand x 5 reps, 2 sets  From low/sofa-like surface-cues for forward lean, increased momentum and slowed pace to sit  Sit to stand with PWR! UP + Rock + forward step 3 reps each leg Good form  Sit to stand with PWR! UP + Rock + step and short distance gait/short distance rock/step turn Added cognitive task slows pace, decreased foot clearance  Forward/back walking along counter, 2 x 2 minutes 2nd set with 2# weight at ankles-better pace and more foot clearance with weights  Forward step over obstacle>back step and weightshift 2# at ankles, 10 reps each More unsteady with LLE as stance  Side step over obstacle x 10 2# at ankles        HOME EXERCISE PROGRAM:  Access Code: [email or web address] URL: [email or web address] Date: 05/16/2024 Prepared by: [facility] - Outpatient   Rehab - [facility]  PWR! Up and PWR! Rock in standing 10 reps Program Notes 1.  Side step over obstacle (pool noodle, cane)-one foot out to the side, and then bring back into place.  Then repeat 10 reps2.  Forward step over obstacle (pool noodle or cane)-one foot forward, then bring back into place.  Then repeat 10 reps  Exercises - Seated Long Arc Quad with Ankle Weight  - 1 x daily - 7 x weekly - 3 sets - 10 reps - Sit to Stand with Arm Swing  - 1 x daily - 7 x weekly - 3 sets - 10 reps - Alternating Step Taps  with Counter Support  - 1 x daily - 7 x weekly - 3 sets - 10 reps - Seated Hamstring Stretch  - 1 x daily - 7 x weekly - 1 sets - 3 reps - 30 sec hold - Standing Gastroc Stretch on Step  - 1 x daily - 7 x weekly - 1 sets - 3 reps - 30 sec hold  PATIENT EDUCATION: Education details: PWR! Moves for transfers, weigthshifting, step to initiate gait and turns Person educated: Patient Education method: Explanation, Demonstration, and Handouts Education comprehension: verbalized understanding, returned demonstration, and needs further education  ---------------------------------------------- Note: Objective measures were completed at Evaluation unless otherwise noted.  DIAGNOSTIC FINDINGS: NA this episode  COGNITION: Overall cognitive status: Within functional limits for tasks assessed and History of cognitive impairments - at baseline   POSTURE: rounded shoulders, forward head, posterior pelvic tilt, flexed trunk , and weight shift right  LOWER EXTREMITY ROM:   WFL seated  Active  Right Eval Left Eval  Hip flexion    Hip extension    Hip abduction    Hip adduction    Hip internal rotation    Hip external rotation    Knee flexion    Knee extension    Ankle dorsiflexion    Ankle plantarflexion    Ankle inversion    Ankle eversion     (Blank rows = not tested)  LOWER EXTREMITY MMT:    MMT Right Eval Left Eval  Hip flexion 4 4  Hip extension    Hip  abduction 4 4  Hip adduction 4 4  Hip internal rotation    Hip external rotation    Knee flexion 4 4  Knee extension 4 4  Ankle dorsiflexion 4 4  Ankle plantarflexion    Ankle inversion    Ankle eversion    (Blank rows = not tested)  TRANSFERS: Sit to stand: Modified independence  Assistive device utilized: None     Stand to sit: Modified independence  Assistive device utilized: None     More difficult time getting up from soft,low surface-recliner, sofa, desk chair GAIT: Findings: Gait Characteristics: step through pattern, decreased step length- Right, decreased step length- Left, shuffling, festinating, narrow BOS, poor foot clearance- Right, and poor foot clearance- Left, Distance walked: 50 ft, Assistive device utilized:Single point cane, Level of assistance: SBA, and Comments: festination with turns, and difficulty turning fully to sit in chair   FUNCTIONAL TESTS:  5 times sit to stand: 9.72 sec with some posterior lean Timed up and go (TUG): 16.34 sec-decreased eccentric control to fully turn to sit 10 meter walk test: 11.1 sec = 2.95 ft/sec 360 turn R:  19 steps, 10.53 sec 360 turn L:  17 steps, 8.09 sec TUG manual:  18.06 sec-decreased eccentric control to sit TUG cognitive:  19.06 sec-decreased eccentric control, festination with turn to sit (nearly misses chair) 3 M walk back:  7.96 sec  TREATMENT DATE: 05/04/2024    PATIENT EDUCATION: Education details: Eval results, POC Person educated: Patient Education method: Explanation Education comprehension: verbalized understanding  HOME EXERCISE PROGRAM: Not yet initiated  GOALS: Goals reviewed with patient? Yes  SHORT TERM GOALS: Target date: 06/03/2024  Pt will be independent with HEP for improved strength, balance, gait. Baseline: Goal status: IN PROGRESS  2.  Pt will improve 5x  sit<>stand to less than or equal to 10 sec, with no episodes of posterior lean, to demonstrate improved functional strength and transfer efficiency. Baseline: 9.72 sec and 2 episodes of posterior lean Goal status: IN PROGRESS  3.  Pt will improve TUG score to less than or equal to 13.5 sec for decreased fall risk.  Baseline: 16.34 sec Goal status: IN PROGRESS   LONG TERM GOALS: Target date: 07/01/2024  Pt will be independent with HEP for improved strength, balance, gait. Baseline:  Goal status: IN PROGRESS  2.  Pt will improve TUG manual/TUG cognitive score to less than or equal to 15 sec for decreased fall risk. Baseline: 18/19 sec Goal status: IN PROGRESS  3.  Pt will improve MiniBESTest score to at least 18/28 to decrease fall risk. Baseline: 11/28 Goal status: IN PROGRESS  4.  Pt will perform 360 turns in 12 steps or less, for improved balance with turns. Baseline:  Goal status: IN PROGRESS  5.  Pt will report at least 50% improvement in low surface transfers. Baseline:  Goal status: IN PROGRESS  6.  Pt will verbalize plans for continued community fitness upon d/c from PT.  Baseline:  Goal status: IN PROGRESS    ASSESSMENT:  CLINICAL IMPRESSION: Continued to work on combining PWR!up with stepping and rocking. Added cognitive tasks with mathematics today. Worked on multidirectional stepping backwards and cross stepping.   OBJECTIVE IMPAIRMENTS: Abnormal gait, decreased balance, decreased mobility, difficulty walking, decreased strength, and postural dysfunction.   ACTIVITY LIMITATIONS: carrying, sitting, standing, transfers, locomotion level, and caring for others  PARTICIPATION LIMITATIONS: meal prep, cleaning, laundry, and community activity  PERSONAL FACTORS: 3+ comorbidities: See PMH above are also affecting patient's functional outcome.   REHAB POTENTIAL: Good  CLINICAL DECISION MAKING: Evolving/moderate complexity  EVALUATION COMPLEXITY:  Moderate  PLAN:  PT FREQUENCY: 2x/week  PT DURATION: 8 weeks plus eval  PLANNED INTERVENTIONS: 97750- Physical Performance Testing, 97110-Therapeutic exercises, 97530- Therapeutic activity, [id number]- Neuromuscular re-education, 97535- Self Care, 02859- Manual therapy, [phone]- Gait training, Patient/Family education, and Balance training  PLAN FOR NEXT SESSION: Progress balance exercises, functional strength, posture; work on turns, backwards walk, tips to decrease festination.   Shelisha Gautier April Ma L Cloud Graham, PT, DPT 05/24/2024, 2:03 PM  [name] at [facility] [address] [place], [address] [place], [place] [address] Phone # [phone] Fax # [phone]

## 2024-05-26 ENCOUNTER — Encounter: Payer: Self-pay | Admitting: Physical Therapy

## 2024-05-26 ENCOUNTER — Ambulatory Visit: Admitting: Physical Therapy

## 2024-05-26 DIAGNOSIS — G20A1 Parkinson's disease without dyskinesia, without mention of fluctuations: Secondary | ICD-10-CM

## 2024-05-26 DIAGNOSIS — R293 Abnormal posture: Secondary | ICD-10-CM

## 2024-05-26 DIAGNOSIS — R29818 Other symptoms and signs involving the nervous system: Secondary | ICD-10-CM

## 2024-05-26 DIAGNOSIS — R2681 Unsteadiness on feet: Secondary | ICD-10-CM

## 2024-05-26 DIAGNOSIS — R2689 Other abnormalities of gait and mobility: Secondary | ICD-10-CM | POA: Diagnosis not present

## 2024-05-26 DIAGNOSIS — M6281 Muscle weakness (generalized): Secondary | ICD-10-CM

## 2024-05-26 NOTE — Therapy (Signed)
 OUTPATIENT PHYSICAL THERAPY NEURO TREATMENT NOTE   Patient Name: [name] [name] MRN: [id number] DOB:October 24, 1950, 73 y.o., male Today's Date: 05/26/2024   PCP: Frann REFERRING PROVIDER: Evonnie Asberry RAMAN, DO   END OF SESSION:  PT End of Session - 05/26/24 1442     Visit Number 6    Number of Visits 18    Date for PT Re-Evaluation 07/01/24    Authorization Type Medicare/BCBS    Progress Note Due on Visit 10    PT Start Time 1445    PT Stop Time 1525    PT Time Calculation (min) 40 min    Activity Tolerance Patient tolerated treatment well    Behavior During Therapy [facility] for tasks assessed/performed               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/27/2014   Mild neurocognitive disorder due to Parkinson's disease 01/10/2022   Non-Hodgkin lymphoma 2007   diffuse- 6 cycles of chemo with R CHOP Rituxan - last dose 10/08   Nonspecific elevation of levels of transaminase or lactic acid dehydrogenase (LDH) 07/06/2008   Parkinson's disease 07/27/2014   Post-splenectomy 04/02/2015   Past Surgical History:  Procedure Laterality Date   CHOLECYSTECTOMY, LAPAROSCOPIC     EYE SURGERY     x2 as child   PORT-A-CATH REMOVAL     PORTACATH PLACEMENT     SPLENECTOMY     TONSILLECTOMY     Patient Active Problem List   Diagnosis Date Noted   Mild neurocognitive disorder due to Parkinson's disease 01/10/2022   Gastroesophageal reflux disease 03/07/2020   Localized swelling of right lower extremity 04/12/2019   DOE (dyspnea on exertion) 07/15/2018   10 year risk of MI or stroke 7.5% or  greater 04/12/2018   Post-splenectomy 04/02/2015   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/27/2014   Dysphagia, neurologic 07/27/2014   Parkinson's disease (HCC) 07/27/2014   Dystonia 07/10/2014   Low back pain 11/21/2011   Nonspecific elevation of levels of transaminase or lactic acid dehydrogenase (LDH) 07/06/2008   Enlarged lymph nodes 07/28/2007   Non-Hodgkin lymphoma 03/23/2007    ONSET DATE: 04/12/2024  REFERRING DIAG:  G20.A2 (ICD-10-CM) - Parkinson's disease without dyskinesia, with fluctuating manifestations (HCC)  R49.8 (ICD-10-CM) - Hypophonia  R47.1 (ICD-10-CM) - Dysarthria    THERAPY DIAG:  Unsteadiness on feet  Other abnormalities of gait and mobility  Muscle weakness (generalized)  Abnormal posture  Other symptoms and signs involving the nervous system  Parkinson's disease without dyskinesia or fluctuating manifestations (HCC)  Rationale for Evaluation and Treatment: Rehabilitation  SUBJECTIVE:  SUBJECTIVE STATEMENT: Pt states he's doing fantastic today. Was worn out after last session. Practiced reaching to cabinets. States his leg weights came in from [employer].  Pt accompanied by: self  PERTINENT HISTORY: Hx of Meige syndrome, insomnia, RBD, PD, GERD, MCI  PAIN:  Are you having pain? Yes: NPRS scale: 0/10 Pain location: Lower back Pain description: sore Aggravating factors: sitting in car too long Relieving factors: sitting  PRECAUTIONS: Fall; hypophonia  RED FLAGS: None   WEIGHT BEARING RESTRICTIONS: No  FALLS: Has patient fallen in last 6 months? Yes. Number of falls 2  LIVING ENVIRONMENT: Lives with: lives with their family Lives in: House/apartment Stairs: 3 main sets of stairs; bilateral rails for first set; L rails going up to  third floor hobby room Has following equipment at home: Single point cane and with small quad tip base; Has RW, but doesn't use  PLOF: Independent with household mobility with device and Independent with community mobility with device  PATIENT GOALS: To get back to where I was  OBJECTIVE:  TODAY'S TREATMENT: 05/26/2024 Activity Comments  NuStep, Level 4-7, HIIT with 4 extremities x 10 minutes Intermittent increase/decrease of resistance  Hands on cabinets sliding up for trunk ext x10 Low trap setting x10   Sit to stand with PWR! UP + fwd step 2x5    Sit to stand with PWR! UP + fwd PWR! Step + PWR! Rock 2x5 Cueing required for sequencing   Sit to stand with PWR! UP + fwd PWR! Step + PWR! Rock + PWR!twist 2x5 Cognitive tasks touching various post it notes with numbers  Seated thoracic ext against ball x10 Seated shoulder ER 2x10 Seated ant/post pelvic tilt 2x10                 TODAY'S TREATMENT: 05/24/2024 Activity Comments  NuStep, Level 4, 4 extremities x 8 minutes SPM 60's self-selected pace; >80 for 30-60 sec intervals  Hands on cabinets sliding up for trunk ext x10 Low trap setting x10   Sit to stand with PWR! UP + Rock reaching to cabinets Good form  Sit to stand with PWR! UP + side step reaching to cabinet  Cognitive tasks touching various post it notes with numbers  Sit to stand with PWR!UP + fwd step reach to cabinet Cognitive tasks touching various post it notes with numbers  Diagonals 3# 2x10   Forward/back walking along counter, x 2 minutes   Cariocas x 2 minutes   Seated thoracic ext x10 Seated thoracic ext + rotation x10           HOME EXERCISE PROGRAM: Access Code: [email or web address] URL: [email or web address] Date: 05/26/2024 Prepared by: Kynlei Piontek April Earnie Starring  Program Notes 1.  Side step over obstacle (pool noodle, cane)-one foot out to the side, and then bring back into place.  Then repeat 10 reps2.  Forward step over obstacle (pool noodle or  cane)-one foot forward, then bring back into place.  Then repeat 10 reps  Exercises - Seated Long Arc Quad with Ankle Weight  - 1 x daily - 7 x weekly - 3 sets - 10 reps - Sit to Stand with Arm Swing  - 1 x daily - 7 x weekly - 3 sets - 10 reps - Alternating Step Taps with Counter Support  - 1 x daily - 7 x weekly - 3 sets - 10 reps - Seated Hamstring Stretch  - 1 x daily - 7 x weekly - 1 sets - 3 reps - 30 sec hold -  Standing Gastroc Stretch on Step  - 1 x daily - 7 x weekly - 1 sets - 3 reps - 30 sec hold - Low Trap Setting at Wall  - 1 x daily - 7 x weekly - 2 sets - 10 reps - Seated Thoracic Lumbar Extension with Pectoralis Stretch  - 1 x daily - 7 x weekly - 2 sets - 10 reps - Seated Pelvic Tilts  - 1 x daily - 7 x weekly - 2 sets - 10 reps - Shoulder External Rotation and Scapular Retraction  - 1 x daily - 7 x weekly - 2 sets - 10 reps  PATIENT EDUCATION: Education details: PWR! Moves for transfers, weigthshifting, step to initiate gait and turns Person educated: Patient Education method: Explanation, Demonstration, and Handouts Education comprehension: verbalized understanding, returned demonstration, and needs further education  ---------------------------------------------- Note: Objective measures were completed at Evaluation unless otherwise noted.  DIAGNOSTIC FINDINGS: NA this episode  COGNITION: Overall cognitive status: Within functional limits for tasks assessed and History of cognitive impairments - at baseline   POSTURE: rounded shoulders, forward head, posterior pelvic tilt, flexed trunk , and weight shift right  LOWER EXTREMITY ROM:   WFL seated  Active  Right Eval Left Eval  Hip flexion    Hip extension    Hip abduction    Hip adduction    Hip internal rotation    Hip external rotation    Knee flexion    Knee extension    Ankle dorsiflexion    Ankle plantarflexion    Ankle inversion    Ankle eversion     (Blank rows = not tested)  LOWER EXTREMITY  MMT:    MMT Right Eval Left Eval  Hip flexion 4 4  Hip extension    Hip abduction 4 4  Hip adduction 4 4  Hip internal rotation    Hip external rotation    Knee flexion 4 4  Knee extension 4 4  Ankle dorsiflexion 4 4  Ankle plantarflexion    Ankle inversion    Ankle eversion    (Blank rows = not tested)  TRANSFERS: Sit to stand: Modified independence  Assistive device utilized: None     Stand to sit: Modified independence  Assistive device utilized: None     More difficult time getting up from soft,low surface-recliner, sofa, desk chair GAIT: Findings: Gait Characteristics: step through pattern, decreased step length- Right, decreased step length- Left, shuffling, festinating, narrow BOS, poor foot clearance- Right, and poor foot clearance- Left, Distance walked: 50 ft, Assistive device utilized:Single point cane, Level of assistance: SBA, and Comments: festination with turns, and difficulty turning fully to sit in chair   FUNCTIONAL TESTS:  5 times sit to stand: 9.72 sec with some posterior lean Timed up and go (TUG): 16.34 sec-decreased eccentric control to fully turn to sit 10 meter walk test: 11.1 sec = 2.95 ft/sec 360 turn R:  19 steps, 10.53 sec 360 turn L:  17 steps, 8.09 sec TUG manual:  18.06 sec-decreased eccentric control to sit TUG cognitive:  19.06 sec-decreased eccentric control, festination with turn to sit (nearly misses chair) 3 M walk back:  7.96 sec  TREATMENT DATE: 05/04/2024    PATIENT EDUCATION: Education details: Eval results, POC Person educated: Patient Education method: Explanation Education comprehension: verbalized understanding  HOME EXERCISE PROGRAM: Not yet initiated  GOALS: Goals reviewed with patient? Yes  SHORT TERM GOALS: Target date: 06/03/2024  Pt will be independent with HEP for improved strength,  balance, gait. Baseline: Goal status: IN PROGRESS  2.  Pt will improve 5x sit<>stand to less than or equal to 10 sec, with no episodes of posterior lean, to demonstrate improved functional strength and transfer efficiency. Baseline: 9.72 sec and 2 episodes of posterior lean Goal status: IN PROGRESS  3.  Pt will improve TUG score to less than or equal to 13.5 sec for decreased fall risk.  Baseline: 16.34 sec Goal status: IN PROGRESS   LONG TERM GOALS: Target date: 07/01/2024  Pt will be independent with HEP for improved strength, balance, gait. Baseline:  Goal status: IN PROGRESS  2.  Pt will improve TUG manual/TUG cognitive score to less than or equal to 15 sec for decreased fall risk. Baseline: 18/19 sec Goal status: IN PROGRESS  3.  Pt will improve MiniBESTest score to at least 18/28 to decrease fall risk. Baseline: 11/28 Goal status: IN PROGRESS  4.  Pt will perform 360 turns in 12 steps or less, for improved balance with turns. Baseline:  Goal status: IN PROGRESS  5.  Pt will report at least 50% improvement in low surface transfers. Baseline:  Goal status: IN PROGRESS  6.  Pt will verbalize plans for continued community fitness upon d/c from PT.  Baseline:  Goal status: IN PROGRESS    ASSESSMENT:  CLINICAL IMPRESSION: Worked on incorporating PWR! Moves into a sequence today for more motor planning. Continued trying to progress trunk extensor strengthening.   OBJECTIVE IMPAIRMENTS: Abnormal gait, decreased balance, decreased mobility, difficulty walking, decreased strength, and postural dysfunction.   ACTIVITY LIMITATIONS: carrying, sitting, standing, transfers, locomotion level, and caring for others  PARTICIPATION LIMITATIONS: meal prep, cleaning, laundry, and community activity  PERSONAL FACTORS: 3+ comorbidities: See PMH above are also affecting patient's functional outcome.   REHAB POTENTIAL: Good  CLINICAL DECISION MAKING: Evolving/moderate  complexity  EVALUATION COMPLEXITY: Moderate  PLAN:  PT FREQUENCY: 2x/week  PT DURATION: 8 weeks plus eval  PLANNED INTERVENTIONS: 97750- Physical Performance Testing, 97110-Therapeutic exercises, 97530- Therapeutic activity, [id number]- Neuromuscular re-education, 97535- Self Care, 02859- Manual therapy, [phone]- Gait training, Patient/Family education, and Balance training  PLAN FOR NEXT SESSION: Progress balance exercises, functional strength, posture; work on turns, backwards walk, tips to decrease festination.   Abdiel Blackerby April Ma L Katee Wentland, PT, DPT 05/26/2024, 2:42 PM  [facility] at [facility] [address] [place], [address] [place], [place] [address] Phone # [phone] Fax # [phone]

## 2024-05-31 ENCOUNTER — Ambulatory Visit: Admitting: Physical Therapy

## 2024-05-31 DIAGNOSIS — R2681 Unsteadiness on feet: Secondary | ICD-10-CM

## 2024-05-31 DIAGNOSIS — G20A1 Parkinson's disease without dyskinesia, without mention of fluctuations: Secondary | ICD-10-CM

## 2024-05-31 DIAGNOSIS — M6281 Muscle weakness (generalized): Secondary | ICD-10-CM

## 2024-05-31 DIAGNOSIS — R2689 Other abnormalities of gait and mobility: Secondary | ICD-10-CM | POA: Diagnosis not present

## 2024-05-31 DIAGNOSIS — R29818 Other symptoms and signs involving the nervous system: Secondary | ICD-10-CM | POA: Diagnosis not present

## 2024-05-31 DIAGNOSIS — R293 Abnormal posture: Secondary | ICD-10-CM

## 2024-05-31 NOTE — Therapy (Signed)
 OUTPATIENT PHYSICAL THERAPY NEURO TREATMENT NOTE   Patient Name: [name] [name] MRN: [id number] DOB:[birth year], 73 y.o., male Today's Date: 05/31/2024   PCP: Frann REFERRING PROVIDER: Evonnie Asberry RAMAN, DO   END OF SESSION:  PT End of Session - 05/31/24 1452     Visit Number 7    Number of Visits 18    Date for PT Re-Evaluation 07/01/24    Authorization Type Medicare/BCBS    Progress Note Due on Visit 10    PT Start Time 1446    PT Stop Time 1525    PT Time Calculation (min) 39 min    Activity Tolerance Patient tolerated treatment well    Behavior During Therapy [facility] for tasks assessed/performed                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/27/2014   Mild neurocognitive disorder due to Parkinson's disease 01/10/2022   Non-Hodgkin lymphoma 2007   diffuse- 6 cycles of chemo with R CHOP Rituxan - last dose 10/08   Nonspecific elevation of levels of transaminase or lactic acid dehydrogenase (LDH) 07/06/2008   Parkinson's disease 07/27/2014   Post-splenectomy 04/02/2015   Past Surgical History:  Procedure Laterality Date   CHOLECYSTECTOMY, LAPAROSCOPIC     EYE SURGERY     x2 as child   PORT-A-CATH REMOVAL     PORTACATH PLACEMENT     SPLENECTOMY     TONSILLECTOMY     Patient Active Problem List   Diagnosis Date Noted   Mild neurocognitive disorder due to Parkinson's disease 01/10/2022   Gastroesophageal reflux disease 03/07/2020   Localized swelling of right lower extremity 04/12/2019   DOE (dyspnea on exertion) 07/15/2018   10 year risk of MI or stroke 7.5%  or greater 04/12/2018   Post-splenectomy 04/02/2015   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  Rationale for Evaluation and Treatment: Rehabilitation  SUBJECTIVE:  SUBJECTIVE STATEMENT: Pt reports no issues. Pt states his wife feels he is standing taller.  Pt accompanied by: self  PERTINENT HISTORY: Hx of Meige syndrome, insomnia, RBD, PD, GERD, MCI  PAIN:  Are you having pain? Yes: NPRS scale: 0/10 Pain location: Lower back Pain description: sore Aggravating factors: sitting in car too long Relieving factors: sitting  PRECAUTIONS: Fall; hypophonia  RED FLAGS: None   WEIGHT BEARING RESTRICTIONS: No  FALLS: Has patient fallen in last 6 months? Yes. Number of falls 2  LIVING ENVIRONMENT: Lives with: lives with their family Lives in: House/apartment Stairs: 3 main sets of stairs; bilateral rails for first set; L rails going up to third floor hobby room Has following equipment at home: Single point cane and with small quad tip base; Has RW, but doesn't use  PLOF: Independent with household mobility with device and Independent with community mobility with device  PATIENT GOALS: To get back to where I was  OBJECTIVE:  TODAY'S  TREATMENT: 05/26/2024 Activity Comments  NuStep, Level 5-8, HIIT with 4 extremities x 10 minutes Intermittent increase/decrease of resistance  Low trap setting x10 Wall circles x10 Hands off wall for horizontal shoulder abd    PWR!supine prepare x10 each up, rock, twist, step   Vitals  132/81, 77 BPM after sup to sit 126/74, 78 BPM After sit to stand  Seated shoulder ER 2x10 Seated ant/post pelvic tilt 2x10 Shoulder diagonals x10 Cues to look up and perform diaphragmatic breaths to improve trunk extension                        HOME EXERCISE PROGRAM: Access Code: [email or web address] URL: [email or web address] Date: 05/26/2024 Prepared by: Miabella Shannahan April Earnie Starring  Program Notes 1.  Side step over obstacle (pool noodle, cane)-one foot out to the side, and then bring back into place.  Then repeat 10 reps2.  Forward step over obstacle (pool noodle or cane)-one foot forward, then bring back into place.  Then repeat 10 reps  Exercises - Seated Long Arc Quad with Ankle Weight  - 1 x daily - 7 x weekly - 3 sets - 10 reps - Sit to Stand with Arm Swing  - 1 x daily - 7 x weekly - 3 sets - 10 reps - Alternating Step Taps with Counter Support  - 1 x daily - 7 x weekly - 3 sets - 10 reps - Seated Hamstring Stretch  - 1 x daily - 7 x weekly - 1 sets - 3 reps - 30 sec hold - Standing Gastroc Stretch on Step  - 1 x daily - 7 x weekly - 1 sets - 3 reps - 30 sec hold - Low Trap Setting at Wall  - 1 x daily - 7 x weekly - 2 sets - 10 reps - Seated Thoracic Lumbar Extension with Pectoralis Stretch  - 1 x daily - 7 x weekly - 2 sets - 10 reps - Seated Pelvic Tilts  - 1 x daily - 7 x weekly - 2 sets - 10 reps - Shoulder External Rotation and Scapular Retraction  - 1 x daily - 7 x weekly - 2 sets - 10 reps  PATIENT EDUCATION: Education details: PWR! Moves for transfers, weigthshifting, step to initiate gait and turns Person educated: Patient Education method: Explanation, Demonstration,  and Handouts Education comprehension: verbalized understanding, returned demonstration, and needs further education  ---------------------------------------------- Note: Objective measures were completed at Evaluation unless otherwise noted.  DIAGNOSTIC FINDINGS: NA this  episode  COGNITION: Overall cognitive status: Within functional limits for tasks assessed and History of cognitive impairments - at baseline   POSTURE: rounded shoulders, forward head, posterior pelvic tilt, flexed trunk , and weight shift right  LOWER EXTREMITY ROM:   WFL seated  Active  Right Eval Left Eval  Hip flexion    Hip extension    Hip abduction    Hip adduction    Hip internal rotation    Hip external rotation    Knee flexion    Knee extension    Ankle dorsiflexion    Ankle plantarflexion    Ankle inversion    Ankle eversion     (Blank rows = not tested)  LOWER EXTREMITY MMT:    MMT Right Eval Left Eval  Hip flexion 4 4  Hip extension    Hip abduction 4 4  Hip adduction 4 4  Hip internal rotation    Hip external rotation    Knee flexion 4 4  Knee extension 4 4  Ankle dorsiflexion 4 4  Ankle plantarflexion    Ankle inversion    Ankle eversion    (Blank rows = not tested)  TRANSFERS: Sit to stand: Modified independence  Assistive device utilized: None     Stand to sit: Modified independence  Assistive device utilized: None     More difficult time getting up from soft,low surface-recliner, sofa, desk chair GAIT: Findings: Gait Characteristics: step through pattern, decreased step length- Right, decreased step length- Left, shuffling, festinating, narrow BOS, poor foot clearance- Right, and poor foot clearance- Left, Distance walked: 50 ft, Assistive device utilized:Single point cane, Level of assistance: SBA, and Comments: festination with turns, and difficulty turning fully to sit in chair   FUNCTIONAL TESTS:  5 times sit to stand: 9.72 sec with some posterior lean Timed up and go  (TUG): 16.34 sec-decreased eccentric control to fully turn to sit 10 meter walk test: 11.1 sec = 2.95 ft/sec 360 turn R:  19 steps, 10.53 sec 360 turn L:  17 steps, 8.09 sec TUG manual:  18.06 sec-decreased eccentric control to sit TUG cognitive:  19.06 sec-decreased eccentric control, festination with turn to sit (nearly misses chair) 3 M walk back:  7.96 sec                                                                                                                               TREATMENT DATE: 05/04/2024    PATIENT EDUCATION: Education details: Eval results, POC Person educated: Patient Education method: Explanation Education comprehension: verbalized understanding  HOME EXERCISE PROGRAM: Not yet initiated  GOALS: Goals reviewed with patient? Yes  SHORT TERM GOALS: Target date: 06/03/2024  Pt will be independent with HEP for improved strength, balance, gait. Baseline: Goal status: IN PROGRESS  2.  Pt will improve 5x sit<>stand to less than or equal to 10 sec, with no episodes of posterior lean, to demonstrate improved functional strength and transfer efficiency.  Baseline: 9.72 sec and 2 episodes of posterior lean Goal status: IN PROGRESS  3.  Pt will improve TUG score to less than or equal to 13.5 sec for decreased fall risk.  Baseline: 16.34 sec Goal status: IN PROGRESS   LONG TERM GOALS: Target date: 07/01/2024  Pt will be independent with HEP for improved strength, balance, gait. Baseline:  Goal status: IN PROGRESS  2.  Pt will improve TUG manual/TUG cognitive score to less than or equal to 15 sec for decreased fall risk. Baseline: 18/19 sec Goal status: IN PROGRESS  3.  Pt will improve MiniBESTest score to at least 18/28 to decrease fall risk. Baseline: 11/28 Goal status: IN PROGRESS  4.  Pt will perform 360 turns in 12 steps or less, for improved balance with turns. Baseline:  Goal status: IN PROGRESS  5.  Pt will report at least 50% improvement in  low surface transfers. Baseline:  Goal status: IN PROGRESS  6.  Pt will verbalize plans for continued community fitness upon d/c from PT.  Baseline:  Goal status: IN PROGRESS    ASSESSMENT:  CLINICAL IMPRESSION: PWR! Moves performed in supine today. Continued trunk extensor strengthening and reviewed portions of pt's HEP. Pt with some reports of dizziness today with transfers so deferred sit to stand exercises. Will recheck STGs next session.   OBJECTIVE IMPAIRMENTS: Abnormal gait, decreased balance, decreased mobility, difficulty walking, decreased strength, and postural dysfunction.   ACTIVITY LIMITATIONS: carrying, sitting, standing, transfers, locomotion level, and caring for others  PARTICIPATION LIMITATIONS: meal prep, cleaning, laundry, and community activity  PERSONAL FACTORS: 3+ comorbidities: See PMH above are also affecting patient's functional outcome.   REHAB POTENTIAL: Good  CLINICAL DECISION MAKING: Evolving/moderate complexity  EVALUATION COMPLEXITY: Moderate  PLAN:  PT FREQUENCY: 2x/week  PT DURATION: 8 weeks plus eval  PLANNED INTERVENTIONS: 97750- Physical Performance Testing, 97110-Therapeutic exercises, 97530- Therapeutic activity, 97112- Neuromuscular re-education, 97535- Self Care, 02859- Manual therapy, [phone]- Gait training, Patient/Family education, and Balance training  PLAN FOR NEXT SESSION: Progress balance exercises, functional strength (consider resisted walking), posture; work on turns, backwards walk, tips to decrease festination.   Lizbeth Feijoo April Ma L Aaliyan Brinkmeier, PT, DPT 05/31/2024, 2:52 PM  [facility] at [facility] [address] [place], [address] [place], [place] [address] Phone # [phone] Fax # [phone]

## 2024-06-02 ENCOUNTER — Encounter: Payer: Self-pay | Admitting: Physical Therapy

## 2024-06-02 ENCOUNTER — Ambulatory Visit: Admitting: Physical Therapy

## 2024-06-02 DIAGNOSIS — R29818 Other symptoms and signs involving the nervous system: Secondary | ICD-10-CM | POA: Diagnosis not present

## 2024-06-02 DIAGNOSIS — M6281 Muscle weakness (generalized): Secondary | ICD-10-CM

## 2024-06-02 DIAGNOSIS — R2689 Other abnormalities of gait and mobility: Secondary | ICD-10-CM

## 2024-06-02 DIAGNOSIS — R293 Abnormal posture: Secondary | ICD-10-CM

## 2024-06-02 DIAGNOSIS — R2681 Unsteadiness on feet: Secondary | ICD-10-CM | POA: Diagnosis not present

## 2024-06-02 DIAGNOSIS — G20A1 Parkinson's disease without dyskinesia, without mention of fluctuations: Secondary | ICD-10-CM

## 2024-06-02 NOTE — Therapy (Signed)
 OUTPATIENT PHYSICAL THERAPY NEURO TREATMENT NOTE   Patient Name: [name] [name] MRN: [id number] DOB:13-Apr-1951, 73 y.o., male Today's Date: 06/02/2024   PCP: Frann REFERRING PROVIDER: Evonnie Asberry RAMAN, DO   END OF SESSION:  PT End of Session - 06/02/24 1450     Visit Number 8    Number of Visits 18    Date for PT Re-Evaluation 07/01/24    Authorization Type Medicare/BCBS    Progress Note Due on Visit 10    PT Start Time 1447    PT Stop Time 1525    PT Time Calculation (min) 38 min    Activity Tolerance Patient tolerated treatment well    Behavior During Therapy [facility] for tasks assessed/performed                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/27/2014   Mild neurocognitive disorder due to Parkinson's disease 01/10/2022   Non-Hodgkin lymphoma 2007   diffuse- 6 cycles of chemo with R CHOP Rituxan - last dose 10/08   Nonspecific elevation of levels of transaminase or lactic acid dehydrogenase (LDH) 07/06/2008   Parkinson's disease 07/27/2014   Post-splenectomy 04/02/2015   Past Surgical History:  Procedure Laterality Date   CHOLECYSTECTOMY, LAPAROSCOPIC     EYE SURGERY     x2 as child   PORT-A-CATH REMOVAL     PORTACATH PLACEMENT     SPLENECTOMY     TONSILLECTOMY     Patient Active Problem List   Diagnosis Date Noted   Mild neurocognitive disorder due to Parkinson's disease 01/10/2022   Gastroesophageal reflux disease 03/07/2020   Localized swelling of right lower extremity 04/12/2019   DOE (dyspnea on exertion) 07/15/2018   10 year risk of MI or stroke 7.5%  or greater 04/12/2018   Post-splenectomy 04/02/2015   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/27/2014   Dysphagia, neurologic 07/27/2014   Parkinson's disease (HCC) 07/27/2014   Dystonia 07/10/2014   Low back pain 11/21/2011   Nonspecific elevation of levels of transaminase or lactic acid dehydrogenase (LDH) 07/06/2008   Enlarged lymph nodes 07/28/2007   Non-Hodgkin lymphoma 03/23/2007    ONSET DATE: 04/12/2024  REFERRING DIAG:  G20.A2 (ICD-10-CM) - Parkinson's disease without dyskinesia, with fluctuating manifestations (HCC)  R49.8 (ICD-10-CM) - Hypophonia  R47.1 (ICD-10-CM) - Dysarthria    THERAPY DIAG:  Unsteadiness on feet  Other abnormalities of gait and mobility  Muscle weakness (generalized)  Abnormal posture  Other symptoms and signs involving the nervous system  Parkinson's disease without dyskinesia or fluctuating manifestations (HCC)  Rationale for Evaluation and Treatment: Rehabilitation  SUBJECTIVE:  SUBJECTIVE STATEMENT: Pt reports no issues. Pt states his wife feels he is standing taller.  Pt accompanied by: self  PERTINENT HISTORY: Hx of Meige syndrome, insomnia, RBD, PD, GERD, MCI  PAIN:  Are you having pain? Yes: NPRS scale: 0/10 Pain location: Lower back Pain description: sore Aggravating factors: sitting in car too long Relieving factors: sitting  PRECAUTIONS: Fall; hypophonia  RED FLAGS: None   WEIGHT BEARING RESTRICTIONS: No  FALLS: Has patient fallen in last 6 months? Yes. Number of falls 2  LIVING ENVIRONMENT: Lives with: lives with their family Lives in: House/apartment Stairs: 3 main sets of stairs; bilateral rails for first set; L rails going up to third floor hobby room Has following equipment at home: Single point cane  and with small quad tip base; Has RW, but doesn't use  PLOF: Independent with household mobility with device and Independent with community mobility with device  PATIENT GOALS: To get back to where I was  OBJECTIVE:  TODAY'S TREATMENT: 05/26/2024 Activity Comments  NuStep, Level 5-8, HIIT with 4 extremities x 10 minutes Intermittent increase/decrease of resistance  Standing low trap setting x10 Standing shoulder flexion on wall x10   Cervical retraction into pillow + ball 2x10 Shoulder horizontal abd red TB 2x10 Shoulder external rotation red TB 2x10 Supine  5x STS 9.65 sec with 1 posterior LOB  PWR!up into PWR!step x10   TUG no device 9.88 sec  Modified deadlift 5# 2x10   Seated thoracic ext x10 Seated thoracic ext with rotation x10   360 deg turn to L 5.19 sec with 8 steps  360 deg turn to R 4.47 sec with 9 steps  Amb with cane fwd stepping over 4 hurdles Amb with cane side stepping over 4 hurdles   2 LOBs         HOME EXERCISE PROGRAM: Access Code: [email or web address] URL: [email or web address] Date: 06/02/2024 Prepared by: Calleen Alvis April Earnie Starring  Program Notes 1.  Side step over obstacle (pool noodle, cane)-one foot out to the side, and then bring back into place.  Then repeat 10 reps2.  Forward step over obstacle (pool noodle or cane)-one foot forward, then bring back into place.  Then repeat 10 reps  Exercises - Sit to Stand with Arm Swing  - 1 x daily - 7 x weekly - 3 sets - 10 reps - Alternating Step Taps with Counter Support  - 1 x daily - 7 x weekly - 3 sets - 10 reps - Seated Hamstring Stretch  - 1 x daily - 7 x weekly - 1 sets - 3 reps - 30 sec hold - Standing Gastroc Stretch on Step  - 1 x daily - 7 x weekly - 1 sets - 3 reps - 30 sec hold - Low Trap Setting at Wall  - 1 x daily - 7 x weekly - 2 sets - 10 reps - Seated Thoracic Lumbar Extension with Pectoralis Stretch  - 1 x daily - 7 x weekly - 2 sets - 10 reps - Seated Pelvic Tilts  - 1 x daily -  7 x weekly - 2 sets - 10 reps - Shoulder External Rotation and Scapular Retraction  - 1 x daily - 7 x weekly - 2 sets - 10 reps - Wall Shoulder Horizontal Abduction/Adduction with a Towel  - 1 x daily - 7 x weekly - 2 sets - 10 reps - Half Deadlift with Kettlebell  - 1 x daily - 7 x weekly - 2 sets - 10 reps -  Seated Cervical Retraction  - 3-4 x daily - 7 x weekly - 2 sets - 10 reps   PATIENT EDUCATION: Education details: PWR! Moves for transfers, weigthshifting, step to initiate gait and turns Person educated: Patient Education method: Explanation, Demonstration, and Handouts Education comprehension: verbalized understanding, returned demonstration, and needs further education  ---------------------------------------------- Note: Objective measures were completed at Evaluation unless otherwise noted.  DIAGNOSTIC FINDINGS: NA this episode  COGNITION: Overall cognitive status: Within functional limits for tasks assessed and History of cognitive impairments - at baseline   POSTURE: rounded shoulders, forward head, posterior pelvic tilt, flexed trunk , and weight shift right  LOWER EXTREMITY ROM:   WFL seated  Active  Right Eval Left Eval  Hip flexion    Hip extension    Hip abduction    Hip adduction    Hip internal rotation    Hip external rotation    Knee flexion    Knee extension    Ankle dorsiflexion    Ankle plantarflexion    Ankle inversion    Ankle eversion     (Blank rows = not tested)  LOWER EXTREMITY MMT:    MMT Right Eval Left Eval  Hip flexion 4 4  Hip extension    Hip abduction 4 4  Hip adduction 4 4  Hip internal rotation    Hip external rotation    Knee flexion 4 4  Knee extension 4 4  Ankle dorsiflexion 4 4  Ankle plantarflexion    Ankle inversion    Ankle eversion    (Blank rows = not tested)  TRANSFERS: Sit to stand: Modified independence  Assistive device utilized: None     Stand to sit: Modified independence  Assistive device utilized:  None     More difficult time getting up from soft,low surface-recliner, sofa, desk chair GAIT: Findings: Gait Characteristics: step through pattern, decreased step length- Right, decreased step length- Left, shuffling, festinating, narrow BOS, poor foot clearance- Right, and poor foot clearance- Left, Distance walked: 50 ft, Assistive device utilized:Single point cane, Level of assistance: SBA, and Comments: festination with turns, and difficulty turning fully to sit in chair   FUNCTIONAL TESTS:  5 times sit to stand: 9.72 sec with some posterior lean Timed up and go (TUG): 16.34 sec-decreased eccentric control to fully turn to sit 10 meter walk test: 11.1 sec = 2.95 ft/sec 360 turn R:  19 steps, 10.53 sec 360 turn L:  17 steps, 8.09 sec TUG manual:  18.06 sec-decreased eccentric control to sit TUG cognitive:  19.06 sec-decreased eccentric control, festination with turn to sit (nearly misses chair) 3 M walk back:  7.96 sec                                                                                                                               TREATMENT DATE: 05/04/2024    PATIENT EDUCATION: Education details: Eval results, POC Person educated: Patient Education method: Explanation Education  comprehension: verbalized understanding  HOME EXERCISE PROGRAM: Not yet initiated  GOALS: Goals reviewed with patient? Yes  SHORT TERM GOALS: Target date: 06/03/2024  Pt will be independent with HEP for improved strength, balance, gait. Baseline: Goal status: MET  2.  Pt will improve 5x sit<>stand to less than or equal to 10 sec, with no episodes of posterior lean, to demonstrate improved functional strength and transfer efficiency. Baseline: 9.72 sec and 2 episodes of posterior lean 06/02/24: 9.65 sec and 1 episode of posterior lean Goal status: IN PROGRESS  3.  Pt will improve TUG score to less than or equal to 13.5 sec for decreased fall risk.  Baseline: 16.34 sec 06/02/24: 9.88  sec Goal status: MET   LONG TERM GOALS: Target date: 07/01/2024  Pt will be independent with HEP for improved strength, balance, gait. Baseline:  Goal status: IN PROGRESS  2.  Pt will improve TUG manual/TUG cognitive score to less than or equal to 15 sec for decreased fall risk. Baseline: 18/19 sec Goal status: IN PROGRESS  3.  Pt will improve MiniBESTest score to at least 18/28 to decrease fall risk. Baseline: 11/28 Goal status: IN PROGRESS  4.  Pt will perform 360 turns in 12 steps or less, for improved balance with turns. Baseline:  Goal status: IN PROGRESS  5.  Pt will report at least 50% improvement in low surface transfers. Baseline:  Goal status: IN PROGRESS  6.  Pt will verbalize plans for continued community fitness upon d/c from PT.  Baseline:  Goal status: IN PROGRESS    ASSESSMENT:  CLINICAL IMPRESSION: Pt has met 2/3 STGs. Has not fully met his 5x STS goal without any posterior LOB. Has been improving his turns utilizing less steps. Worked on continued trunk/posterior chain strengthening to improve posture and COG for balance and weight shifting with daily tasks. Pt is slowly meeting all goals.   OBJECTIVE IMPAIRMENTS: Abnormal gait, decreased balance, decreased mobility, difficulty walking, decreased strength, and postural dysfunction.   ACTIVITY LIMITATIONS: carrying, sitting, standing, transfers, locomotion level, and caring for others  PARTICIPATION LIMITATIONS: meal prep, cleaning, laundry, and community activity  PERSONAL FACTORS: 3+ comorbidities: See PMH above are also affecting patient's functional outcome.   REHAB POTENTIAL: Good  CLINICAL DECISION MAKING: Evolving/moderate complexity  EVALUATION COMPLEXITY: Moderate  PLAN:  PT FREQUENCY: 2x/week  PT DURATION: 8 weeks plus eval  PLANNED INTERVENTIONS: 97750- Physical Performance Testing, 97110-Therapeutic exercises, 97530- Therapeutic activity, 97112- Neuromuscular re-education, 97535-  Self Care, 02859- Manual therapy, [phone]- Gait training, Patient/Family education, and Balance training  PLAN FOR NEXT SESSION: Progress balance exercises, functional strength (consider resisted walking), posture; work on turns, backwards walk, tips to decrease festination.   Elchonon Maxson April Ma L Katniss Weedman, PT, DPT 06/02/2024, 2:51 PM  [facility] at [facility] [address] [place], [address] [place], [place] [address] Phone # [phone] Fax # [phone]

## 2024-06-07 ENCOUNTER — Encounter: Payer: Self-pay | Admitting: Physical Therapy

## 2024-06-07 ENCOUNTER — Ambulatory Visit: Admitting: Physical Therapy

## 2024-06-07 DIAGNOSIS — R2681 Unsteadiness on feet: Secondary | ICD-10-CM | POA: Diagnosis not present

## 2024-06-07 DIAGNOSIS — R293 Abnormal posture: Secondary | ICD-10-CM | POA: Diagnosis not present

## 2024-06-07 DIAGNOSIS — R2689 Other abnormalities of gait and mobility: Secondary | ICD-10-CM | POA: Diagnosis not present

## 2024-06-07 DIAGNOSIS — R29818 Other symptoms and signs involving the nervous system: Secondary | ICD-10-CM | POA: Diagnosis not present

## 2024-06-07 DIAGNOSIS — G20A1 Parkinson's disease without dyskinesia, without mention of fluctuations: Secondary | ICD-10-CM

## 2024-06-07 DIAGNOSIS — M6281 Muscle weakness (generalized): Secondary | ICD-10-CM

## 2024-06-07 NOTE — Therapy (Signed)
 OUTPATIENT PHYSICAL THERAPY NEURO TREATMENT NOTE   Patient Name: [name] [name] MRN: [id number] DOB:10-Dec-1950, 73 y.o., male Today's Date: 06/07/2024   PCP: Frann REFERRING PROVIDER: Evonnie Asberry RAMAN, DO   END OF SESSION:  PT End of Session - 06/07/24 1444     Visit Number 9    Number of Visits 18    Date for PT Re-Evaluation 07/01/24    Authorization Type Medicare/BCBS    Progress Note Due on Visit 10    PT Start Time 1444    PT Stop Time 1525    PT Time Calculation (min) 41 min    Activity Tolerance Patient tolerated treatment well    Behavior During Therapy [facility] for tasks assessed/performed                 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/27/2014   Mild neurocognitive disorder due to Parkinson's disease 01/10/2022   Non-Hodgkin lymphoma 2007   diffuse- 6 cycles of chemo with R CHOP Rituxan - last dose 10/08   Nonspecific elevation of levels of transaminase or lactic acid dehydrogenase (LDH) 07/06/2008   Parkinson's disease 07/27/2014   Post-splenectomy 04/02/2015   Past Surgical History:  Procedure Laterality Date   CHOLECYSTECTOMY, LAPAROSCOPIC     EYE SURGERY     x2 as child   PORT-A-CATH REMOVAL     PORTACATH PLACEMENT     SPLENECTOMY     TONSILLECTOMY     Patient Active Problem List   Diagnosis Date Noted   Mild neurocognitive disorder due to Parkinson's disease 01/10/2022   Gastroesophageal reflux disease 03/07/2020   Localized swelling of right lower extremity 04/12/2019   DOE (dyspnea on exertion) 07/15/2018   10 year risk of MI or stroke  7.5% or greater 04/12/2018   Post-splenectomy 04/02/2015   Meige syndrome (blepharospasm with oromandibular dystonia) 07/27/2014   Dysphagia, neurologic 07/27/2014   Parkinson's disease (HCC) 07/27/2014   Dystonia 07/10/2014   Low back pain 11/21/2011   Nonspecific elevation of levels of transaminase or lactic acid dehydrogenase (LDH) 07/06/2008   Enlarged lymph nodes 07/28/2007   Non-Hodgkin lymphoma 03/23/2007    ONSET DATE: 04/12/2024  REFERRING DIAG:  G20.A2 (ICD-10-CM) - Parkinson's disease without dyskinesia, with fluctuating manifestations (HCC)  R49.8 (ICD-10-CM) - Hypophonia  R47.1 (ICD-10-CM) - Dysarthria    THERAPY DIAG:  Unsteadiness on feet  Other abnormalities of gait and mobility  Muscle weakness (generalized)  Abnormal posture  Other symptoms and signs involving the nervous system  Parkinson's disease without dyskinesia or fluctuating manifestations (HCC)  Rationale for Evaluation and Treatment: Rehabilitation  SUBJECTIVE:  SUBJECTIVE STATEMENT: Pt reports no new complaints. Denies falls.  Pt accompanied by: self  PERTINENT HISTORY: Hx of Meige syndrome, insomnia, RBD, PD, GERD, MCI  PAIN:  Are you having pain? Yes: NPRS scale: 0/10 Pain location: Lower back Pain description: sore Aggravating factors: sitting in car too long Relieving factors: sitting  PRECAUTIONS: Fall; hypophonia  RED FLAGS: None   WEIGHT BEARING RESTRICTIONS: No  FALLS: Has patient fallen in last 6 months? Yes. Number of falls 2  LIVING ENVIRONMENT: Lives with: lives with their family Lives in: House/apartment Stairs: 3 main sets of stairs; bilateral rails for first set; L rails going up to third floor hobby room Has following equipment at home: Single point cane and with small quad tip  base; Has RW, but doesn't use  PLOF: Independent with household mobility with device and Independent with community mobility with device  PATIENT GOALS: To get back to where I was  OBJECTIVE:  TODAY'S TREATMENT: 06/07/2024 Activity Comments  NuStep, Level 5-9, HIIT with 4 extremities x 10 minutes Intermittent increase/decrease of resistance  Standing low trap setting x10 Standing hip flexor stretch 5x10 sec   Wall slide into horizontal shoulder abd 2# in each hand x10 PWR! Rock against wall 2#  in each hand x10 PWR!twist against wall 2# in each hand x10 Modified deadlift off wall 2# in each hand 2x10   Seated cervical retraction 2x10 Seated Ant/post pelvic tilt x10 Coregeous ball along lumbar         HOME EXERCISE PROGRAM: Access Code: [email or web address] URL: [email or web address] Date: 06/02/2024 Prepared by: Pressley Barsky April Earnie Starring  Program Notes 1.  Side step over obstacle (pool noodle, cane)-one foot out to the side, and then bring back into place.  Then repeat 10 reps2.  Forward step over obstacle (pool noodle or cane)-one foot forward, then bring back into place.  Then repeat 10 reps  Exercises - Sit to Stand with Arm Swing  - 1 x daily - 7 x weekly - 3 sets - 10 reps - Alternating Step Taps with Counter Support  - 1 x daily - 7 x weekly - 3 sets - 10 reps - Seated Hamstring Stretch  - 1 x daily - 7 x weekly - 1 sets - 3 reps - 30 sec hold - Standing Gastroc Stretch on Step  - 1 x daily - 7 x weekly - 1 sets - 3 reps - 30 sec hold - Low Trap Setting at Wall  - 1 x daily - 7 x weekly - 2 sets - 10 reps - Seated Thoracic Lumbar Extension with Pectoralis Stretch  - 1 x daily - 7 x weekly - 2 sets - 10 reps - Seated Pelvic Tilts  - 1 x daily - 7 x weekly - 2 sets - 10 reps - Shoulder External Rotation and Scapular Retraction  - 1 x daily - 7 x weekly - 2 sets - 10 reps - Wall Shoulder Horizontal Abduction/Adduction with a Towel  - 1 x daily - 7 x weekly - 2 sets - 10  reps - Half Deadlift with Kettlebell  - 1 x daily - 7 x weekly - 2 sets - 10 reps - Seated Cervical Retraction  - 3-4 x daily - 7 x weekly - 2 sets - 10 reps   PATIENT EDUCATION: Education details: PWR! Moves for transfers, weigthshifting, step to initiate gait and turns Person educated: Patient Education method: Explanation, Demonstration, and Handouts Education comprehension: verbalized understanding, returned demonstration, and needs further education  ----------------------------------------------  Note: Objective measures were completed at Evaluation unless otherwise noted.  DIAGNOSTIC FINDINGS: NA this episode  COGNITION: Overall cognitive status: Within functional limits for tasks assessed and History of cognitive impairments - at baseline   POSTURE: rounded shoulders, forward head, posterior pelvic tilt, flexed trunk , and weight shift right  LOWER EXTREMITY ROM:   WFL seated  Active  Right Eval Left Eval  Hip flexion    Hip extension    Hip abduction    Hip adduction    Hip internal rotation    Hip external rotation    Knee flexion    Knee extension    Ankle dorsiflexion    Ankle plantarflexion    Ankle inversion    Ankle eversion     (Blank rows = not tested)  LOWER EXTREMITY MMT:    MMT Right Eval Left Eval  Hip flexion 4 4  Hip extension    Hip abduction 4 4  Hip adduction 4 4  Hip internal rotation    Hip external rotation    Knee flexion 4 4  Knee extension 4 4  Ankle dorsiflexion 4 4  Ankle plantarflexion    Ankle inversion    Ankle eversion    (Blank rows = not tested)  TRANSFERS: Sit to stand: Modified independence  Assistive device utilized: None     Stand to sit: Modified independence  Assistive device utilized: None     More difficult time getting up from soft,low surface-recliner, sofa, desk chair GAIT: Findings: Gait Characteristics: step through pattern, decreased step length- Right, decreased step length- Left, shuffling,  festinating, narrow BOS, poor foot clearance- Right, and poor foot clearance- Left, Distance walked: 50 ft, Assistive device utilized:Single point cane, Level of assistance: SBA, and Comments: festination with turns, and difficulty turning fully to sit in chair   FUNCTIONAL TESTS:  5 times sit to stand: 9.72 sec with some posterior lean Timed up and go (TUG): 16.34 sec-decreased eccentric control to fully turn to sit 10 meter walk test: 11.1 sec = 2.95 ft/sec 360 turn R:  19 steps, 10.53 sec 360 turn L:  17 steps, 8.09 sec TUG manual:  18.06 sec-decreased eccentric control to sit TUG cognitive:  19.06 sec-decreased eccentric control, festination with turn to sit (nearly misses chair) 3 M walk back:  7.96 sec                                                                                                                               TREATMENT DATE: 05/04/2024    PATIENT EDUCATION: Education details: Eval results, POC Person educated: Patient Education method: Explanation Education comprehension: verbalized understanding  HOME EXERCISE PROGRAM: Not yet initiated  GOALS: Goals reviewed with patient? Yes  SHORT TERM GOALS: Target date: 06/03/2024  Pt will be independent with HEP for improved strength, balance, gait. Baseline: Goal status: MET  2.  Pt will improve 5x sit<>stand to less than or equal to 10 sec,  with no episodes of posterior lean, to demonstrate improved functional strength and transfer efficiency. Baseline: 9.72 sec and 2 episodes of posterior lean 06/02/24: 9.65 sec and 1 episode of posterior lean Goal status: IN PROGRESS  3.  Pt will improve TUG score to less than or equal to 13.5 sec for decreased fall risk.  Baseline: 16.34 sec 06/02/24: 9.88 sec Goal status: MET   LONG TERM GOALS: Target date: 07/01/2024  Pt will be independent with HEP for improved strength, balance, gait. Baseline:  Goal status: IN PROGRESS  2.  Pt will improve TUG manual/TUG  cognitive score to less than or equal to 15 sec for decreased fall risk. Baseline: 18/19 sec Goal status: IN PROGRESS  3.  Pt will improve MiniBESTest score to at least 18/28 to decrease fall risk. Baseline: 11/28 Goal status: IN PROGRESS  4.  Pt will perform 360 turns in 12 steps or less, for improved balance with turns. Baseline:  Goal status: IN PROGRESS  5.  Pt will report at least 50% improvement in low surface transfers. Baseline:  Goal status: IN PROGRESS  6.  Pt will verbalize plans for continued community fitness upon d/c from PT.  Baseline:  Goal status: IN PROGRESS    ASSESSMENT:  CLINICAL IMPRESSION: Continued to work on improving trunk and hip extensors to reduce posterior LOBs with transfers. Able to tolerate progression to 2# weights with his standing PWR! Moves for increased postural strengthening.   OBJECTIVE IMPAIRMENTS: Abnormal gait, decreased balance, decreased mobility, difficulty walking, decreased strength, and postural dysfunction.   ACTIVITY LIMITATIONS: carrying, sitting, standing, transfers, locomotion level, and caring for others  PARTICIPATION LIMITATIONS: meal prep, cleaning, laundry, and community activity  PERSONAL FACTORS: 3+ comorbidities: See PMH above are also affecting patient's functional outcome.   REHAB POTENTIAL: Good  CLINICAL DECISION MAKING: Evolving/moderate complexity  EVALUATION COMPLEXITY: Moderate  PLAN:  PT FREQUENCY: 2x/week  PT DURATION: 8 weeks plus eval  PLANNED INTERVENTIONS: 97750- Physical Performance Testing, 97110-Therapeutic exercises, 97530- Therapeutic activity, 97112- Neuromuscular re-education, 97535- Self Care, 02859- Manual therapy, [phone]- Gait training, Patient/Family education, and Balance training  PLAN FOR NEXT SESSION: Progress balance exercises, functional strength (consider resisted walking), posture; work on turns, backwards walk, tips to decrease festination.   Kindel Rochefort April Ma L Joleigh Mineau, PT,  DPT 06/07/2024, 2:44 PM  [facility] at [facility] [address] [place], [address] [place], [place] [address] Phone # [phone] Fax # [phone]

## 2024-06-09 ENCOUNTER — Ambulatory Visit: Admitting: Physical Therapy

## 2024-06-09 ENCOUNTER — Encounter: Payer: Self-pay | Admitting: Physical Therapy

## 2024-06-09 DIAGNOSIS — R2689 Other abnormalities of gait and mobility: Secondary | ICD-10-CM | POA: Diagnosis not present

## 2024-06-09 DIAGNOSIS — R29818 Other symptoms and signs involving the nervous system: Secondary | ICD-10-CM

## 2024-06-09 DIAGNOSIS — R2681 Unsteadiness on feet: Secondary | ICD-10-CM

## 2024-06-09 DIAGNOSIS — G20A1 Parkinson's disease without dyskinesia, without mention of fluctuations: Secondary | ICD-10-CM

## 2024-06-09 DIAGNOSIS — M6281 Muscle weakness (generalized): Secondary | ICD-10-CM

## 2024-06-09 DIAGNOSIS — R293 Abnormal posture: Secondary | ICD-10-CM | POA: Diagnosis not present

## 2024-06-09 NOTE — Therapy (Signed)
 OUTPATIENT PHYSICAL THERAPY NEURO TREATMENT NOTE AND 10TH VISIT PROGRESS NOTE   Progress Note Reporting Period 05/04/2024 to 06/09/2024  See note below for Objective Data and Assessment of Progress/Goals.      Patient Name: [name] [name] MRN: [id number] DOB:Jan 21, 1951, 73 y.o., male Today's Date: 06/09/2024   PCP: Frann REFERRING PROVIDER: Evonnie Asberry RAMAN, DO   END OF SESSION:  PT End of Session - 06/09/24 1449     Visit Number 10    Number of Visits 18    Date for Recertification  07/01/24    Authorization Type Medicare/BCBS    Progress Note Due on Visit 10    PT Start Time 1448    PT Stop Time 1526    PT Time Calculation (min) 38 min    Activity Tolerance Patient tolerated treatment well    Behavior During Therapy [facility] for tasks assessed/performed                  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   Mild neurocognitive disorder due to Parkinson's disease 01/10/2022   Non-Hodgkin lymphoma 2007   diffuse- 6 cycles of chemo with R CHOP Rituxan - last dose 10/08   Nonspecific elevation of levels of transaminase or lactic acid dehydrogenase (LDH) 07/06/2008   Parkinson's disease 07/27/2014   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/02/2015   Past Surgical History:  Procedure Laterality Date   CHOLECYSTECTOMY, LAPAROSCOPIC     EYE SURGERY     x2 as child   PORT-A-CATH REMOVAL     PORTACATH PLACEMENT     SPLENECTOMY     TONSILLECTOMY     Patient Active Problem List   Diagnosis Date Noted   Mild neurocognitive disorder due to Parkinson's disease 01/10/2022    Gastroesophageal reflux disease 03/07/2020   Localized swelling of right lower extremity 04/12/2019   DOE (dyspnea on exertion) 07/15/2018   10 year risk of MI or stroke 7.5% or greater 04/12/2018   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/02/2015   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/27/2014   Dysphagia, neurologic 07/27/2014   Parkinson's disease (HCC) 07/27/2014   Dystonia 07/10/2014   Low back pain 11/21/2011   Nonspecific elevation of levels of transaminase or lactic acid dehydrogenase (LDH) 07/06/2008   Enlarged lymph nodes 07/28/2007   Non-Hodgkin lymphoma 03/23/2007    ONSET DATE: 04/12/2024  REFERRING DIAG:  G20.A2 (ICD-10-CM) - Parkinson's disease without dyskinesia, with fluctuating manifestations (HCC)  R49.8 (ICD-10-CM) - Hypophonia  R47.1 (ICD-10-CM) - Dysarthria    THERAPY DIAG:  Unsteadiness on feet  Other abnormalities of gait and mobility  Muscle weakness (generalized)  Abnormal posture  Other symptoms and signs involving the nervous system  Parkinson's disease without dyskinesia or fluctuating manifestations (HCC)  Rationale for Evaluation and Treatment: Rehabilitation  SUBJECTIVE:  SUBJECTIVE STATEMENT: Pt reports no new complaints. Nothing new or different.  Pt accompanied by: self  PERTINENT HISTORY: Hx of Meige syndrome, insomnia, RBD, PD, GERD, MCI  PAIN:  Are you having pain? Yes: NPRS scale: 0/10 Pain location: Lower back Pain description: sore Aggravating factors: sitting in car too long Relieving factors: sitting  PRECAUTIONS: Fall; hypophonia  RED FLAGS: None   WEIGHT BEARING RESTRICTIONS: No  FALLS: Has patient fallen in last 6 months? Yes. Number of falls 2  LIVING ENVIRONMENT: Lives with: lives with their family Lives in:  House/apartment Stairs: 3 main sets of stairs; bilateral rails for first set; L rails going up to third floor hobby room Has following equipment at home: Single point cane and with small quad tip base; Has RW, but doesn't use  PLOF: Independent with household mobility with device and Independent with community mobility with device  PATIENT GOALS: To get back to where I was  OBJECTIVE:  TODAY'S TREATMENT: 06/07/2024 Activity Comments  NuStep, Level 4-8, HIIT with 4 extremities x 8 minutes Intermittent increase/decrease of resistance  PWR!up into PWR!step in standing x5 PWR!up sit to stand   TUG = 11.25 sec TUG manual = 10.75 sec TUG cognitive = 12.9 sec   360 deg turns 12 steps, 8 steps, 6 steps to the L 7 steps, 7 steps, 7 steps to the R  Quadruped:  PWR! X10 each Cat/cow x10 Hip ext x10 Chair as needed  Floor to stand t/f x 3 With chair assist          HOME EXERCISE PROGRAM: Access Code: [email or web address] URL: [email or web address] Date: 06/02/2024 Prepared by: Souleymane Saiki April Earnie Starring  Program Notes 1.  Side step over obstacle (pool noodle, cane)-one foot out to the side, and then bring back into place.  Then repeat 10 reps2.  Forward step over obstacle (pool noodle or cane)-one foot forward, then bring back into place.  Then repeat 10 reps  Exercises - Sit to Stand with Arm Swing  - 1 x daily - 7 x weekly - 3 sets - 10 reps - Alternating Step Taps with Counter Support  - 1 x daily - 7 x weekly - 3 sets - 10 reps - Seated Hamstring Stretch  - 1 x daily - 7 x weekly - 1 sets - 3 reps - 30 sec hold - Standing Gastroc Stretch on Step  - 1 x daily - 7 x weekly - 1 sets - 3 reps - 30 sec hold - Low Trap Setting at Wall  - 1 x daily - 7 x weekly - 2 sets - 10 reps - Seated Thoracic Lumbar Extension with Pectoralis Stretch  - 1 x daily - 7 x weekly - 2 sets - 10 reps - Seated Pelvic Tilts  - 1 x daily - 7 x weekly - 2 sets - 10 reps - Shoulder External Rotation and  Scapular Retraction  - 1 x daily - 7 x weekly - 2 sets - 10 reps - Wall Shoulder Horizontal Abduction/Adduction with a Towel  - 1 x daily - 7 x weekly - 2 sets - 10 reps - Half Deadlift with Kettlebell  - 1 x daily - 7 x weekly - 2 sets - 10 reps - Seated Cervical Retraction  - 3-4 x daily - 7 x weekly - 2 sets - 10 reps   PATIENT EDUCATION: Education details: PWR! Moves for transfers, weigthshifting, step to initiate gait and turns Person educated: Patient Education method: Explanation, Demonstration, and Handouts  Education comprehension: verbalized understanding, returned demonstration, and needs further education  ---------------------------------------------- Note: Objective measures were completed at Evaluation unless otherwise noted.  DIAGNOSTIC FINDINGS: NA this episode  COGNITION: Overall cognitive status: Within functional limits for tasks assessed and History of cognitive impairments - at baseline   POSTURE: rounded shoulders, forward head, posterior pelvic tilt, flexed trunk , and weight shift right  LOWER EXTREMITY ROM:   WFL seated  Active  Right Eval Left Eval  Hip flexion    Hip extension    Hip abduction    Hip adduction    Hip internal rotation    Hip external rotation    Knee flexion    Knee extension    Ankle dorsiflexion    Ankle plantarflexion    Ankle inversion    Ankle eversion     (Blank rows = not tested)  LOWER EXTREMITY MMT:    MMT Right Eval Left Eval  Hip flexion 4 4  Hip extension    Hip abduction 4 4  Hip adduction 4 4  Hip internal rotation    Hip external rotation    Knee flexion 4 4  Knee extension 4 4  Ankle dorsiflexion 4 4  Ankle plantarflexion    Ankle inversion    Ankle eversion    (Blank rows = not tested)  TRANSFERS: Sit to stand: Modified independence  Assistive device utilized: None     Stand to sit: Modified independence  Assistive device utilized: None     More difficult time getting up from soft,low  surface-recliner, sofa, desk chair GAIT: Findings: Gait Characteristics: step through pattern, decreased step length- Right, decreased step length- Left, shuffling, festinating, narrow BOS, poor foot clearance- Right, and poor foot clearance- Left, Distance walked: 50 ft, Assistive device utilized:Single point cane, Level of assistance: SBA, and Comments: festination with turns, and difficulty turning fully to sit in chair   FUNCTIONAL TESTS:  5 times sit to stand: 9.72 sec with some posterior lean Timed up and go (TUG): 16.34 sec-decreased eccentric control to fully turn to sit 10 meter walk test: 11.1 sec = 2.95 ft/sec 360 turn R:  19 steps, 10.53 sec 360 turn L:  17 steps, 8.09 sec TUG manual:  18.06 sec-decreased eccentric control to sit TUG cognitive:  19.06 sec-decreased eccentric control, festination with turn to sit (nearly misses chair) 3 M walk back:  7.96 sec                                                                                                                               TREATMENT DATE: 05/04/2024    PATIENT EDUCATION: Education details: Eval results, POC Person educated: Patient Education method: Explanation Education comprehension: verbalized understanding  HOME EXERCISE PROGRAM: Not yet initiated  GOALS: Goals reviewed with patient? Yes  SHORT TERM GOALS: Target date: 06/03/2024  Pt will be independent with HEP for improved strength, balance, gait. Baseline: Goal status: MET  2.  Pt  will improve 5x sit<>stand to less than or equal to 10 sec, with no episodes of posterior lean, to demonstrate improved functional strength and transfer efficiency. Baseline: 9.72 sec and 2 episodes of posterior lean 06/02/24: 9.65 sec and 1 episode of posterior lean 06/09/24: 9.94 sec no LOB Goal status: MET  3.  Pt will improve TUG score to less than or equal to 13.5 sec for decreased fall risk.  Baseline: 16.34 sec 06/02/24: 9.88 sec Goal status: MET   LONG TERM  GOALS: Target date: 07/01/2024  Pt will be independent with HEP for improved strength, balance, gait. Baseline:  Goal status: IN PROGRESS  2.  Pt will improve TUG manual/TUG cognitive score to less than or equal to 15 sec for decreased fall risk. Baseline: 18/19 sec 06/09/24: 10.75/12.9 sec with no a/d Goal status: MET  3.  Pt will improve MiniBESTest score to at least 18/28 to decrease fall risk. Baseline: 11/28 Goal status: IN PROGRESS  4.  Pt will perform 360 turns in 12 steps or less, for improved balance with turns. Baseline:  06/09/24: 7 steps to the R, 6 steps to the L Goal status: MET  5.  Pt will report at least 50% improvement in low surface transfers. Baseline:  Goal status: IN PROGRESS  6.  Pt will verbalize plans for continued community fitness upon d/c from PT.  Baseline:  Goal status: IN PROGRESS    ASSESSMENT:  CLINICAL IMPRESSION: Pt appears more fatigued today -- states he didn't sleep well last night. Pt still demos good improvements towards his goals despite more fatigue today. Pt has now met all of his STGs and has met 2/6 of his LTGs. Turns have improved. Worked in quadruped for trunk stability/mobility and strengthening. Good recall of his quadruped PWR! Moves.   OBJECTIVE IMPAIRMENTS: Abnormal gait, decreased balance, decreased mobility, difficulty walking, decreased strength, and postural dysfunction.   ACTIVITY LIMITATIONS: carrying, sitting, standing, transfers, locomotion level, and caring for others  PARTICIPATION LIMITATIONS: meal prep, cleaning, laundry, and community activity  PERSONAL FACTORS: 3+ comorbidities: See PMH above are also affecting patient's functional outcome.   REHAB POTENTIAL: Good  CLINICAL DECISION MAKING: Evolving/moderate complexity  EVALUATION COMPLEXITY: Moderate  PLAN:  PT FREQUENCY: 2x/week  PT DURATION: 8 weeks plus eval  PLANNED INTERVENTIONS: 97750- Physical Performance Testing, 97110-Therapeutic  exercises, 97530- Therapeutic activity, 97112- Neuromuscular re-education, 97535- Self Care, 02859- Manual therapy, [phone]- Gait training, Patient/Family education, and Balance training  PLAN FOR NEXT SESSION: Progress balance exercises, functional strength (consider resisted walking), posture; work on turns, backwards walk, tips to decrease festination.   Hailea Eaglin April Ma L Katelyne Galster, PT, DPT 06/09/2024, 2:50 PM  [facility] at [facility] [address] [place], [address] [place], [place] [address] Phone # [phone] Fax # [phone]

## 2024-06-13 ENCOUNTER — Ambulatory Visit (INDEPENDENT_AMBULATORY_CARE_PROVIDER_SITE_OTHER): Admitting: Family Medicine

## 2024-06-13 ENCOUNTER — Ambulatory Visit: Payer: Self-pay | Admitting: Family Medicine

## 2024-06-13 ENCOUNTER — Encounter: Payer: Self-pay | Admitting: Family Medicine

## 2024-06-13 VITALS — BP 138/72 | HR 70 | Temp 98.2°F | Resp 18 | Ht 65.0 in | Wt 190.0 lb

## 2024-06-13 DIAGNOSIS — R7303 Prediabetes: Secondary | ICD-10-CM

## 2024-06-13 DIAGNOSIS — L309 Dermatitis, unspecified: Secondary | ICD-10-CM

## 2024-06-13 DIAGNOSIS — N3941 Urge incontinence: Secondary | ICD-10-CM

## 2024-06-13 DIAGNOSIS — E7800 Pure hypercholesterolemia, unspecified: Secondary | ICD-10-CM | POA: Diagnosis not present

## 2024-06-13 LAB — COMPREHENSIVE METABOLIC PANEL WITH GFR
ALT: 8 U/L (ref 0–53)
AST: 18 U/L (ref 0–37)
Albumin: 4 g/dL (ref 3.5–5.2)
Alkaline Phosphatase: 52 U/L (ref 39–117)
BUN: 15 mg/dL (ref 6–23)
CO2: 30 meq/L (ref 19–32)
Calcium: 9 mg/dL (ref 8.4–10.5)
Chloride: 106 meq/L (ref 96–112)
Creatinine, Ser: 1.04 mg/dL (ref 0.40–1.50)
GFR: 71.51 mL/min
Glucose, Bld: 93 mg/dL (ref 70–99)
Potassium: 4.3 meq/L (ref 3.5–5.1)
Sodium: 141 meq/L (ref 135–145)
Total Bilirubin: 0.6 mg/dL (ref 0.2–1.2)
Total Protein: 6.2 g/dL (ref 6.0–8.3)

## 2024-06-13 LAB — LIPID PANEL
Cholesterol: 164 mg/dL (ref 0–200)
HDL: 50.4 mg/dL
LDL Cholesterol: 97 mg/dL (ref 0–99)
NonHDL: 113.46
Total CHOL/HDL Ratio: 3
Triglycerides: 80 mg/dL (ref 0.0–149.0)
VLDL: 16 mg/dL (ref 0.0–40.0)

## 2024-06-13 LAB — HEMOGLOBIN A1C: Hgb A1c MFr Bld: 6.1 % (ref 4.6–6.5)

## 2024-06-13 MED ORDER — MIRABEGRON ER 25 MG PO TB24: 25.0000 mg | ORAL_TABLET | Freq: Every day | ORAL | 2 refills | Status: DC

## 2024-06-13 MED ORDER — TRIAMCINOLONE ACETONIDE 0.1 % EX CREA: 1.0000 | TOPICAL_CREAM | Freq: Two times a day (BID) | CUTANEOUS | 0 refills | Status: DC

## 2024-06-13 NOTE — Progress Notes (Signed)
 Chief Complaint  Patient presents with   skin check    Left leg - onset: 1-2 months     [name] [name] is a 73 y.o. male here for a skin complaint.  Duration: 2 days Location: LLE Pruritic? No Painful? No Drainage? No New soaps/lotions/topicals/detergents? No Sick contacts? No Other associated symptoms: pink patches on LLE, seem to be getting larger Therapies tried thus far: none  Several mo of urgency to go pee. Sometimes will lose control. No pain, bleeding, dc.  Has never taken anything before.  He has no nighttime issues.  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  The patches do blanch.  There is slight scaling over the area.  No drainage, erythema, TTP, fluctuance, excoriation Psych: Age appropriate judgment and insight       Dermatitis - Plan: triamcinolone  cream (KENALOG ) 0.1 %  Prediabetes - Plan: Hemoglobin A1c  Pure hypercholesterolemia - Plan: Comprehensive metabolic panel with GFR, Lipid panel  Urge incontinence -  Plan: mirabegron  ER (MYRBETRIQ ) 25 MG TB24 tablet  Avoid scented products and try not to touch the areas of interest.  Triamcinolone  cream twice daily for the next 10 to 14 days.  Consider biopsy versus referral if still no improvement. Check as above. Check labs. Chronic, not controlled.  Start Myrbetriq  25 mg daily.  Given lack of nighttime symptoms, less suspicious of prostate related etiology.  Follow-up in 1 month.  He will let me know if there are cost issues. The patient voiced understanding and agreement to the plan.  Mabel Mt Commodore, DO 06/13/24 11:23 AM

## 2024-06-13 NOTE — Patient Instructions (Addendum)
 Avoid scented products. Try not to touch the areas of interested.  Keep us  in the loop on MyChart.   Let us  know if you need anything.

## 2024-06-14 ENCOUNTER — Ambulatory Visit

## 2024-06-14 ENCOUNTER — Ambulatory Visit: Admitting: Physical Therapy

## 2024-06-14 DIAGNOSIS — R2681 Unsteadiness on feet: Secondary | ICD-10-CM

## 2024-06-14 DIAGNOSIS — R2689 Other abnormalities of gait and mobility: Secondary | ICD-10-CM | POA: Diagnosis not present

## 2024-06-14 DIAGNOSIS — G20A1 Parkinson's disease without dyskinesia, without mention of fluctuations: Secondary | ICD-10-CM

## 2024-06-14 DIAGNOSIS — R293 Abnormal posture: Secondary | ICD-10-CM

## 2024-06-14 DIAGNOSIS — M6281 Muscle weakness (generalized): Secondary | ICD-10-CM | POA: Diagnosis not present

## 2024-06-14 DIAGNOSIS — R29818 Other symptoms and signs involving the nervous system: Secondary | ICD-10-CM

## 2024-06-14 NOTE — Therapy (Signed)
 OUTPATIENT PHYSICAL THERAPY NEURO TREATMENT NOTE AND 10TH VISIT PROGRESS NOTE   Progress Note Reporting Period 05/04/2024 to 06/14/2024  See note below for Objective Data and Assessment of Progress/Goals.      Patient Name: [name] [name] MRN: [id number] DOB:04-19-1951, 73 y.o., male Today's Date: 06/14/2024   PCP: [facility] REFERRING PROVIDER: Evonnie Asberry RAMAN, DO   END OF SESSION:  PT End of Session - 06/14/24 1416     Visit Number 11    Number of Visits 18    Date for Recertification  07/01/24    Authorization Type Medicare/BCBS    Progress Note Due on Visit 10    PT Start Time 1416    PT Stop Time 1445    PT Time Calculation (min) 29 min    Activity Tolerance Patient tolerated treatment well    Behavior During Therapy [facility] for tasks assessed/performed                  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/27/2014   Mild neurocognitive disorder due to Parkinson's disease 01/10/2022   Non-Hodgkin lymphoma 2007   diffuse- 6 cycles of chemo with R CHOP Rituxan - last dose 10/08   Nonspecific elevation of levels of transaminase or lactic acid dehydrogenase (LDH) 07/06/2008   Parkinson's disease 07/27/2014   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/02/2015   Past Surgical History:  Procedure Laterality Date   CHOLECYSTECTOMY, LAPAROSCOPIC     EYE SURGERY     x2 as child   PORT-A-CATH REMOVAL     PORTACATH PLACEMENT     SPLENECTOMY     TONSILLECTOMY     Patient Active Problem List   Diagnosis Date Noted   Mild neurocognitive disorder due to Parkinson's disease 01/10/2022    Gastroesophageal reflux disease 03/07/2020   Localized swelling of right lower extremity 04/12/2019   DOE (dyspnea on exertion) 07/15/2018   10 year risk of MI or stroke 7.5% or greater 04/12/2018   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/02/2015   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/27/2014   Dysphagia, neurologic 07/27/2014   Parkinson's disease (HCC) 07/27/2014   Dystonia 07/10/2014   Low back pain 11/21/2011   Nonspecific elevation of levels of transaminase or lactic acid dehydrogenase (LDH) 07/06/2008   Enlarged lymph nodes 07/28/2007   Non-Hodgkin lymphoma 03/23/2007    ONSET DATE: 04/12/2024  REFERRING DIAG:  G20.A2 (ICD-10-CM) - Parkinson's disease without dyskinesia, with fluctuating manifestations (HCC)  R49.8 (ICD-10-CM) - Hypophonia  R47.1 (ICD-10-CM) - Dysarthria    THERAPY DIAG:  Unsteadiness on feet  Other abnormalities of gait and mobility  Muscle weakness (generalized)  Abnormal posture  Other symptoms and signs involving the nervous system  Parkinson's disease without dyskinesia or fluctuating manifestations (HCC)  Rationale for Evaluation and Treatment: Rehabilitation  SUBJECTIVE:  SUBJECTIVE STATEMENT: Pt reports no new complaints. Nothing new or different.  Pt accompanied by: self  PERTINENT HISTORY: Hx of Meige syndrome, insomnia, RBD, PD, GERD, MCI  PAIN:  Are you having pain? Yes: NPRS scale: 0/10 Pain location: Lower back Pain description: sore Aggravating factors: sitting in car too long Relieving factors: sitting  PRECAUTIONS: Fall; hypophonia  RED FLAGS: None   WEIGHT BEARING RESTRICTIONS: No  FALLS: Has patient fallen in last 6 months? Yes. Number of falls 2  LIVING ENVIRONMENT: Lives with: lives with their family Lives in:  House/apartment Stairs: 3 main sets of stairs; bilateral rails for first set; L rails going up to third floor hobby room Has following equipment at home: Single point cane and with small quad tip base; Has RW, but doesn't use  PLOF: Independent with household mobility with device and Independent with community mobility with device  PATIENT GOALS: To get back to where I was  OBJECTIVE:  TODAY'S TREATMENT: 06/07/2024 Activity Comments  Quadruped Cat/cow x10 Alternating UE raises x10 Child's pose x 30 Hip ext x10   Quadruped with chest on pball I, T, W, Y x10 each   Heel raise 10x3  Eyes closed on incline x30 Eyes closed on decline x30 Marching 10x3 In // bars for intermittent UE support                  HOME EXERCISE PROGRAM: Access Code: [email or web address] URL: [email or web address] Date: 06/14/2024 Prepared by: Marquisha Nikolov April Earnie Starring  Exercises - Seated Pelvic Tilts  - 1 x daily - 7 x weekly - 2 sets - 10 reps - Half Deadlift with Kettlebell  - 1 x daily - 7 x weekly - 2 sets - 10 reps - Seated Cervical Retraction  - 3-4 x daily - 7 x weekly - 2 sets - 10 reps - Prone Shoulder W on Swiss Ball  - 1 x daily - 7 x weekly - 2 sets - 10 reps - Prone Shoulder Horizontal Abduction on Swiss Ball  - 1 x daily - 7 x weekly - 2 sets - 10 reps - Prone Shoulder Extension on Swiss Ball  - 1 x daily - 7 x weekly - 2 sets - 10 reps - Prone Shoulder Flexion on Swiss Ball  - 1 x daily - 7 x weekly - 2 sets - 10 reps - Standing March  - 1 x daily - 7 x weekly - 1 sets - 10 reps - 3-5 sec hold   PATIENT EDUCATION: Education details: HEP updates Person educated: Patient Education method: Explanation, Demonstration, and Handouts Education comprehension: verbalized understanding, returned demonstration, and needs further education  ---------------------------------------------- Note: Objective measures were completed at Evaluation unless otherwise  noted.  DIAGNOSTIC FINDINGS: NA this episode  COGNITION: Overall cognitive status: Within functional limits for tasks assessed and History of cognitive impairments - at baseline   POSTURE: rounded shoulders, forward head, posterior pelvic tilt, flexed trunk , and weight shift right  LOWER EXTREMITY ROM:   WFL seated  Active  Right Eval Left Eval  Hip flexion    Hip extension    Hip abduction    Hip adduction    Hip internal rotation    Hip external rotation    Knee flexion    Knee extension    Ankle dorsiflexion    Ankle plantarflexion    Ankle inversion    Ankle eversion     (Blank rows = not tested)  LOWER EXTREMITY MMT:    MMT  Right Eval Left Eval  Hip flexion 4 4  Hip extension    Hip abduction 4 4  Hip adduction 4 4  Hip internal rotation    Hip external rotation    Knee flexion 4 4  Knee extension 4 4  Ankle dorsiflexion 4 4  Ankle plantarflexion    Ankle inversion    Ankle eversion    (Blank rows = not tested)  TRANSFERS: Sit to stand: Modified independence  Assistive device utilized: None     Stand to sit: Modified independence  Assistive device utilized: None     More difficult time getting up from soft,low surface-recliner, sofa, desk chair GAIT: Findings: Gait Characteristics: step through pattern, decreased step length- Right, decreased step length- Left, shuffling, festinating, narrow BOS, poor foot clearance- Right, and poor foot clearance- Left, Distance walked: 50 ft, Assistive device utilized:Single point cane, Level of assistance: SBA, and Comments: festination with turns, and difficulty turning fully to sit in chair   FUNCTIONAL TESTS:  5 times sit to stand: 9.72 sec with some posterior lean Timed up and go (TUG): 16.34 sec-decreased eccentric control to fully turn to sit 10 meter walk test: 11.1 sec = 2.95 ft/sec 360 turn R:  19 steps, 10.53 sec 360 turn L:  17 steps, 8.09 sec TUG manual:  18.06 sec-decreased eccentric control to  sit TUG cognitive:  19.06 sec-decreased eccentric control, festination with turn to sit (nearly misses chair) 3 M walk back:  7.96 sec                                                                                                                               TREATMENT DATE: 05/04/2024    PATIENT EDUCATION: Education details: Eval results, POC Person educated: Patient Education method: Explanation Education comprehension: verbalized understanding  HOME EXERCISE PROGRAM: Not yet initiated  GOALS: Goals reviewed with patient? Yes  SHORT TERM GOALS: Target date: 06/03/2024  Pt will be independent with HEP for improved strength, balance, gait. Baseline: Goal status: MET  2.  Pt will improve 5x sit<>stand to less than or equal to 10 sec, with no episodes of posterior lean, to demonstrate improved functional strength and transfer efficiency. Baseline: 9.72 sec and 2 episodes of posterior lean 06/02/24: 9.65 sec and 1 episode of posterior lean 06/09/24: 9.94 sec no LOB Goal status: MET  3.  Pt will improve TUG score to less than or equal to 13.5 sec for decreased fall risk.  Baseline: 16.34 sec 06/02/24: 9.88 sec Goal status: MET   LONG TERM GOALS: Target date: 07/01/2024  Pt will be independent with HEP for improved strength, balance, gait. Baseline:  Goal status: IN PROGRESS  2.  Pt will improve TUG manual/TUG cognitive score to less than or equal to 15 sec for decreased fall risk. Baseline: 18/19 sec 06/09/24: 10.75/12.9 sec with no a/d Goal status: MET  3.  Pt will improve MiniBESTest score to at least 18/28 to decrease  fall risk. Baseline: 11/28 Goal status: IN PROGRESS  4.  Pt will perform 360 turns in 12 steps or less, for improved balance with turns. Baseline:  06/09/24: 7 steps to the R, 6 steps to the L Goal status: MET  5.  Pt will report at least 50% improvement in low surface transfers. Baseline:  Goal status: IN PROGRESS  6.  Pt will verbalize plans  for continued community fitness upon d/c from PT.  Baseline:  Goal status: IN PROGRESS    ASSESSMENT:  CLINICAL IMPRESSION: Truncated session due to late pt arrival. Continued to work on trunk extensor strength and postural stability/strength. Worked in quadruped and then in modified prone with pt's chest on swiss ball. Able to demo good improvements in his balance with increased ability to come up on to his toes without LOB and increase single leg stance time to 3-5 sec. Pt continues to progress well.   OBJECTIVE IMPAIRMENTS: Abnormal gait, decreased balance, decreased mobility, difficulty walking, decreased strength, and postural dysfunction.   ACTIVITY LIMITATIONS: carrying, sitting, standing, transfers, locomotion level, and caring for others  PARTICIPATION LIMITATIONS: meal prep, cleaning, laundry, and community activity  PERSONAL FACTORS: 3+ comorbidities: See PMH above are also affecting patient's functional outcome.   REHAB POTENTIAL: Good  CLINICAL DECISION MAKING: Evolving/moderate complexity  EVALUATION COMPLEXITY: Moderate  PLAN:  PT FREQUENCY: 2x/week  PT DURATION: 8 weeks plus eval  PLANNED INTERVENTIONS: 97750- Physical Performance Testing, 97110-Therapeutic exercises, 97530- Therapeutic activity, 97112- Neuromuscular re-education, 97535- Self Care, [id number]- Manual therapy, [phone]- Gait training, Patient/Family education, and Balance training  PLAN FOR NEXT SESSION: Progress balance exercises, functional strength (consider resisted walking), posture; work on turns, backwards walk, tips to decrease festination.   Laniqua Torrens April Ma L Mckale Haffey, PT, DPT 06/14/2024, 2:17 PM  [facility] at [facility] [address] [place], [address] [place], [place] [address] Phone # [phone] Fax # [phone]

## 2024-06-16 ENCOUNTER — Ambulatory Visit: Admitting: Physical Therapy

## 2024-06-16 ENCOUNTER — Ambulatory Visit

## 2024-06-16 DIAGNOSIS — R293 Abnormal posture: Secondary | ICD-10-CM

## 2024-06-16 DIAGNOSIS — R29818 Other symptoms and signs involving the nervous system: Secondary | ICD-10-CM

## 2024-06-16 DIAGNOSIS — R2681 Unsteadiness on feet: Secondary | ICD-10-CM

## 2024-06-16 DIAGNOSIS — G20A1 Parkinson's disease without dyskinesia, without mention of fluctuations: Secondary | ICD-10-CM | POA: Diagnosis not present

## 2024-06-16 DIAGNOSIS — M6281 Muscle weakness (generalized): Secondary | ICD-10-CM

## 2024-06-16 DIAGNOSIS — R2689 Other abnormalities of gait and mobility: Secondary | ICD-10-CM

## 2024-06-16 NOTE — Therapy (Signed)
 OUTPATIENT PHYSICAL THERAPY NEURO TREATMENT NOTE     Patient Name: [name] [name] MRN: [id number] DOB:1951-08-12, 73 y.o., male Today's Date: 06/16/2024   PCP: Frann REFERRING PROVIDER: Evonnie Asberry RAMAN, DO   END OF SESSION:  PT End of Session - 06/16/24 1542     Visit Number 12    Number of Visits 18    Date for Recertification  07/01/24    Authorization Type Medicare/BCBS    Progress Note Due on Visit 10    PT Start Time 1539    PT Stop Time 1612    PT Time Calculation (min) 33 min    Activity Tolerance Patient tolerated treatment well    Behavior During Therapy [facility] for tasks assessed/performed                   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/27/2014   Mild neurocognitive disorder due to Parkinson's disease 01/10/2022   Non-Hodgkin lymphoma 2007   diffuse- 6 cycles of chemo with R CHOP Rituxan - last dose 10/08   Nonspecific elevation of levels of transaminase or lactic acid dehydrogenase (LDH) 07/06/2008   Parkinson's disease 07/27/2014   Post-splenectomy 04/02/2015   Past Surgical History:  Procedure Laterality Date   CHOLECYSTECTOMY, LAPAROSCOPIC     EYE SURGERY     x2 as child   PORT-A-CATH REMOVAL     PORTACATH PLACEMENT     SPLENECTOMY     TONSILLECTOMY     Patient Active Problem List   Diagnosis Date Noted   Mild neurocognitive disorder due to Parkinson's disease 01/10/2022   Gastroesophageal reflux disease 03/07/2020   Localized swelling of right lower extremity 04/12/2019   DOE (dyspnea on exertion) 07/15/2018   10 year risk of MI or  stroke 7.5% or greater 04/12/2018   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/02/2015   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/27/2014   Dysphagia, neurologic 07/27/2014   Parkinson's disease (HCC) 07/27/2014   Dystonia 07/10/2014   Low back pain 11/21/2011   Nonspecific elevation of levels of transaminase or lactic acid dehydrogenase (LDH) 07/06/2008   Enlarged lymph nodes 07/28/2007   Non-Hodgkin lymphoma 03/23/2007    ONSET DATE: 04/12/2024  REFERRING DIAG:  G20.A2 (ICD-10-CM) - Parkinson's disease without dyskinesia, with fluctuating manifestations (HCC)  R49.8 (ICD-10-CM) - Hypophonia  R47.1 (ICD-10-CM) - Dysarthria    THERAPY DIAG:  Unsteadiness on feet  Other abnormalities of gait and mobility  Muscle weakness (generalized)  Abnormal posture  Other symptoms and signs involving the nervous system  Parkinson's disease without dyskinesia or fluctuating manifestations (HCC)  Rationale for Evaluation and Treatment: Rehabilitation  SUBJECTIVE:  SUBJECTIVE STATEMENT: Pt states he felt really good after last session. Was able to get a swiss ball.  Pt accompanied by: self  PERTINENT HISTORY: Hx of Meige syndrome, insomnia, RBD, PD, GERD, MCI  PAIN:  Are you having pain? Yes: NPRS scale: 0/10 Pain location: Lower back Pain description: sore Aggravating factors: sitting in car too long Relieving factors: sitting  PRECAUTIONS: Fall; hypophonia  RED FLAGS: None   WEIGHT BEARING RESTRICTIONS: No  FALLS: Has patient fallen in last 6 months? Yes. Number of falls 2  LIVING ENVIRONMENT: Lives with: lives with their family Lives in: House/apartment Stairs: 3 main sets of stairs; bilateral rails for first set; L rails going up to third floor hobby room Has following equipment at  home: Single point cane and with small quad tip base; Has RW, but doesn't use  PLOF: Independent with household mobility with device and Independent with community mobility with device  PATIENT GOALS: To get back to where I was  OBJECTIVE:  TODAY'S TREATMENT: 06/16/2024 Activity Comments  Scifit UE bike L1 3 min fwd, 3 min bwd Dynamic warm up  Quadruped Cat/cow x10 Cat/cow with wag the tail x10 Child's pose x20  Bird dog 2x10     Unsteady and cueing to maintain control  Quadruped with chest on pball I, T, W, Y x10 each   Hip hinge into tall kneel x10 Tall kneel, hands on pball alternating UE raises 2x10 Tall kneel, raising pball overhead 2x10 Cues to maintain trunk and hip extension  Difficulty maintaining eccentric control bringing the pball back down  Standing hip/trunk extension raising pball overhead 2x10                HOME EXERCISE PROGRAM: Access Code: [email or web address] URL: [email or web address] Date: 06/14/2024 Prepared by: Earmon Sherrow April Earnie Starring  Exercises - Seated Pelvic Tilts  - 1 x daily - 7 x weekly - 2 sets - 10 reps - Half Deadlift with Kettlebell  - 1 x daily - 7 x weekly - 2 sets - 10 reps - Seated Cervical Retraction  - 3-4 x daily - 7 x weekly - 2 sets - 10 reps - Prone Shoulder W on Swiss Ball  - 1 x daily - 7 x weekly - 2 sets - 10 reps - Prone Shoulder Horizontal Abduction on Swiss Ball  - 1 x daily - 7 x weekly - 2 sets - 10 reps - Prone Shoulder Extension on Swiss Ball  - 1 x daily - 7 x weekly - 2 sets - 10 reps - Prone Shoulder Flexion on Swiss Ball  - 1 x daily - 7 x weekly - 2 sets - 10 reps - Standing March  - 1 x daily - 7 x weekly - 1 sets - 10 reps - 3-5 sec hold   PATIENT EDUCATION: Education details: HEP updates Person educated: Patient Education method: Explanation, Demonstration, and Handouts Education comprehension: verbalized understanding, returned demonstration, and needs further  education  ---------------------------------------------- Note: Objective measures were completed at Evaluation unless otherwise noted.  DIAGNOSTIC FINDINGS: NA this episode  COGNITION: Overall cognitive status: Within functional limits for tasks assessed and History of cognitive impairments - at baseline   POSTURE: rounded shoulders, forward head, posterior pelvic tilt, flexed trunk , and weight shift right  LOWER EXTREMITY ROM:   WFL seated  Active  Right Eval Left Eval  Hip flexion    Hip extension    Hip abduction    Hip adduction    Hip internal rotation  Hip external rotation    Knee flexion    Knee extension    Ankle dorsiflexion    Ankle plantarflexion    Ankle inversion    Ankle eversion     (Blank rows = not tested)  LOWER EXTREMITY MMT:    MMT Right Eval Left Eval  Hip flexion 4 4  Hip extension    Hip abduction 4 4  Hip adduction 4 4  Hip internal rotation    Hip external rotation    Knee flexion 4 4  Knee extension 4 4  Ankle dorsiflexion 4 4  Ankle plantarflexion    Ankle inversion    Ankle eversion    (Blank rows = not tested)  TRANSFERS: Sit to stand: Modified independence  Assistive device utilized: None     Stand to sit: Modified independence  Assistive device utilized: None     More difficult time getting up from soft,low surface-recliner, sofa, desk chair GAIT: Findings: Gait Characteristics: step through pattern, decreased step length- Right, decreased step length- Left, shuffling, festinating, narrow BOS, poor foot clearance- Right, and poor foot clearance- Left, Distance walked: 50 ft, Assistive device utilized:Single point cane, Level of assistance: SBA, and Comments: festination with turns, and difficulty turning fully to sit in chair   FUNCTIONAL TESTS:  5 times sit to stand: 9.72 sec with some posterior lean Timed up and go (TUG): 16.34 sec-decreased eccentric control to fully turn to sit 10 meter walk test: 11.1 sec = 2.95  ft/sec 360 turn R:  19 steps, 10.53 sec 360 turn L:  17 steps, 8.09 sec TUG manual:  18.06 sec-decreased eccentric control to sit TUG cognitive:  19.06 sec-decreased eccentric control, festination with turn to sit (nearly misses chair) 3 M walk back:  7.96 sec                                                                                                                               TREATMENT DATE: 05/04/2024    PATIENT EDUCATION: Education details: Eval results, POC Person educated: Patient Education method: Explanation Education comprehension: verbalized understanding  HOME EXERCISE PROGRAM: Not yet initiated  GOALS: Goals reviewed with patient? Yes  SHORT TERM GOALS: Target date: 06/03/2024  Pt will be independent with HEP for improved strength, balance, gait. Baseline: Goal status: MET  2.  Pt will improve 5x sit<>stand to less than or equal to 10 sec, with no episodes of posterior lean, to demonstrate improved functional strength and transfer efficiency. Baseline: 9.72 sec and 2 episodes of posterior lean 06/02/24: 9.65 sec and 1 episode of posterior lean 06/09/24: 9.94 sec no LOB Goal status: MET  3.  Pt will improve TUG score to less than or equal to 13.5 sec for decreased fall risk.  Baseline: 16.34 sec 06/02/24: 9.88 sec Goal status: MET   LONG TERM GOALS: Target date: 07/01/2024  Pt will be independent with HEP for improved strength, balance, gait. Baseline:  Goal status:  IN PROGRESS  2.  Pt will improve TUG manual/TUG cognitive score to less than or equal to 15 sec for decreased fall risk. Baseline: 18/19 sec 06/09/24: 10.75/12.9 sec with no a/d Goal status: MET  3.  Pt will improve MiniBESTest score to at least 18/28 to decrease fall risk. Baseline: 11/28 Goal status: IN PROGRESS  4.  Pt will perform 360 turns in 12 steps or less, for improved balance with turns. Baseline:  06/09/24: 7 steps to the R, 6 steps to the L Goal status: MET  5.  Pt will  report at least 50% improvement in low surface transfers. Baseline:  Goal status: IN PROGRESS  6.  Pt will verbalize plans for continued community fitness upon d/c from PT.  Baseline:  Goal status: IN PROGRESS    ASSESSMENT:  CLINICAL IMPRESSION: Reviewed HEP using pball. Pt demos good carry over. Continued to work on improving posterior chain weakness and maintaining eccentric trunk/hip extensor control.   OBJECTIVE IMPAIRMENTS: Abnormal gait, decreased balance, decreased mobility, difficulty walking, decreased strength, and postural dysfunction.   ACTIVITY LIMITATIONS: carrying, sitting, standing, transfers, locomotion level, and caring for others  PARTICIPATION LIMITATIONS: meal prep, cleaning, laundry, and community activity  PERSONAL FACTORS: 3+ comorbidities: See PMH above are also affecting patient's functional outcome.   REHAB POTENTIAL: Good  CLINICAL DECISION MAKING: Evolving/moderate complexity  EVALUATION COMPLEXITY: Moderate  PLAN:  PT FREQUENCY: 2x/week  PT DURATION: 8 weeks plus eval  PLANNED INTERVENTIONS: 97750- Physical Performance Testing, 97110-Therapeutic exercises, 97530- Therapeutic activity, 97112- Neuromuscular re-education, 97535- Self Care, 02859- Manual therapy, [phone]- Gait training, Patient/Family education, and Balance training  PLAN FOR NEXT SESSION: Progress balance exercises, postural exercises, quadruped/tall kneeling exercises, consider resisted walking; work on turns, backwards walk, tips to decrease festination.   Emmersen Garraway April Ma L Ona Rathert, PT, DPT 06/16/2024, 4:14 PM  [facility] [address] [place], [address] [place], [place] [address] Phone # [phone] Fax # [phone]

## 2024-06-20 ENCOUNTER — Encounter

## 2024-06-21 DIAGNOSIS — H53022 Refractive amblyopia, left eye: Secondary | ICD-10-CM | POA: Diagnosis not present

## 2024-06-21 DIAGNOSIS — H2513 Age-related nuclear cataract, bilateral: Secondary | ICD-10-CM | POA: Diagnosis not present

## 2024-06-21 DIAGNOSIS — H43393 Other vitreous opacities, bilateral: Secondary | ICD-10-CM | POA: Diagnosis not present

## 2024-06-22 ENCOUNTER — Encounter

## 2024-06-22 NOTE — Therapy (Signed)
 OUTPATIENT PHYSICAL THERAPY NEURO TREATMENT NOTE     Patient Name: [name] [name] MRN: [id number] DOB:04/01/1951, 73 y.o., male Today's Date: 06/23/2024   PCP: Frann REFERRING PROVIDER: Evonnie Asberry RAMAN, DO   END OF SESSION:  PT End of Session - 06/23/24 1446     Visit Number 13    Number of Visits 18    Date for Recertification  07/01/24    Authorization Type Medicare/BCBS    Progress Note Due on Visit 10    PT Start Time 1408   pt late   PT Stop Time 1443    PT Time Calculation (min) 35 min    Equipment Utilized During Treatment Gait belt    Activity Tolerance Patient tolerated treatment well    Behavior During Therapy WFL for tasks assessed/performed                    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/27/2014   Mild neurocognitive disorder due to Parkinson's disease 01/10/2022   Non-Hodgkin lymphoma 2007   diffuse- 6 cycles of chemo with R CHOP Rituxan - last dose 10/08   Nonspecific elevation of levels of transaminase or lactic acid dehydrogenase (LDH) 07/06/2008   Parkinson's disease 07/27/2014   Post-splenectomy 04/02/2015   Past Surgical History:  Procedure Laterality Date   CHOLECYSTECTOMY, LAPAROSCOPIC     EYE SURGERY     x2 as child   PORT-A-CATH REMOVAL     PORTACATH PLACEMENT     SPLENECTOMY     TONSILLECTOMY     Patient Active Problem List   Diagnosis Date Noted   Mild neurocognitive disorder due to Parkinson's disease 01/10/2022   Gastroesophageal reflux disease 03/07/2020   Localized swelling of right lower extremity 04/12/2019   DOE  (dyspnea on exertion) 07/15/2018   10 year risk of MI or stroke 7.5% or greater 04/12/2018   Post-splenectomy 04/02/2015   Meige syndrome (blepharospasm with oromandibular dystonia) 07/27/2014   Dysphagia, neurologic 07/27/2014   Parkinson's disease (HCC) 07/27/2014   Dystonia 07/10/2014   Low back pain 11/21/2011   Nonspecific elevation of levels of transaminase or lactic acid dehydrogenase (LDH) 07/06/2008   Enlarged lymph nodes 07/28/2007   Non-Hodgkin lymphoma 03/23/2007    ONSET DATE: 04/12/2024  REFERRING DIAG:  G20.A2 (ICD-10-CM) - Parkinson's disease without dyskinesia, with fluctuating manifestations (HCC)  R49.8 (ICD-10-CM) - Hypophonia  R47.1 (ICD-10-CM) - Dysarthria    THERAPY DIAG:  Unsteadiness on feet  Other abnormalities of gait and mobility  Muscle weakness (generalized)  Abnormal posture  Rationale for Evaluation and Treatment: Rehabilitation  SUBJECTIVE:  SUBJECTIVE STATEMENT: Have a birthday coming up.   Pt accompanied by: self  PERTINENT HISTORY: Hx of Meige syndrome, insomnia, RBD, PD, GERD, MCI  PAIN:  Are you having pain? Yes: NPRS scale: 3/10 Pain location: R hip Pain description: sore Aggravating factors: sitting in car too long Relieving factors: stretching, walking  PRECAUTIONS: Fall; hypophonia  RED FLAGS: None   WEIGHT BEARING RESTRICTIONS: No  FALLS: Has patient fallen in last 6 months? Yes. Number of falls 2  LIVING ENVIRONMENT: Lives with: lives with their family Lives in: House/apartment Stairs: 3 main sets of stairs; bilateral rails for first set; L rails going up to third floor hobby room Has following equipment at home: Single point cane and with small quad tip base; Has RW, but doesn't use  PLOF: Independent with household mobility  with device and Independent with community mobility with device  PATIENT GOALS: To get back to where I was  OBJECTIVE:     TODAY'S TREATMENT: 06/23/24 Activity Comments  TM walking warmup 2.0 mph x 5 min B UE support; dynamic warm up   gait training + quick speed and direction changes, S turns around therapy poles  Worked through freezing episodes by keeping gaze ahead, wt shifting, maintaining smooth steps. Freezing evident with 1/2 turns. Good ability to change gait speed   Gait + 1/2 turns Freezing and incomplete turns evident   Wt shift on wall + pivot turn More successful with R turn but this was difficult for pt and needs more practice     PATIENT EDUCATION: Education details: POC and pt asking for continuation of therapy  Person educated: Patient Education method: Explanation Education comprehension: verbalized understanding    HOME EXERCISE PROGRAM: Access Code: [email or web address] URL: [email or web address] Date: 06/14/2024 Prepared by: Gellen April Earnie Starring  Exercises - Seated Pelvic Tilts  - 1 x daily - 7 x weekly - 2 sets - 10 reps - Half Deadlift with Kettlebell  - 1 x daily - 7 x weekly - 2 sets - 10 reps - Seated Cervical Retraction  - 3-4 x daily - 7 x weekly - 2 sets - 10 reps - Prone Shoulder W on Swiss Ball  - 1 x daily - 7 x weekly - 2 sets - 10 reps - Prone Shoulder Horizontal Abduction on Swiss Ball  - 1 x daily - 7 x weekly - 2 sets - 10 reps - Prone Shoulder Extension on Swiss Ball  - 1 x daily - 7 x weekly - 2 sets - 10 reps - Prone Shoulder Flexion on Swiss Ball  - 1 x daily - 7 x weekly - 2 sets - 10 reps - Standing March  - 1 x daily - 7 x weekly - 1 sets - 10 reps - 3-5 sec hold   PATIENT EDUCATION: Education details: HEP updates Person educated: Patient Education method: Explanation, Demonstration, and Handouts Education comprehension: verbalized understanding, returned demonstration, and needs further  education  ---------------------------------------------- Note: Objective measures were completed at Evaluation unless otherwise noted.  DIAGNOSTIC FINDINGS: NA this episode  COGNITION: Overall cognitive status: Within functional limits for tasks assessed and History of cognitive impairments - at baseline   POSTURE: rounded shoulders, forward head, posterior pelvic tilt, flexed trunk , and weight shift right  LOWER EXTREMITY ROM:   WFL seated  Active  Right Eval Left Eval  Hip flexion    Hip extension    Hip abduction    Hip adduction    Hip internal rotation  Hip external rotation    Knee flexion    Knee extension    Ankle dorsiflexion    Ankle plantarflexion    Ankle inversion    Ankle eversion     (Blank rows = not tested)  LOWER EXTREMITY MMT:    MMT Right Eval Left Eval  Hip flexion 4 4  Hip extension    Hip abduction 4 4  Hip adduction 4 4  Hip internal rotation    Hip external rotation    Knee flexion 4 4  Knee extension 4 4  Ankle dorsiflexion 4 4  Ankle plantarflexion    Ankle inversion    Ankle eversion    (Blank rows = not tested)  TRANSFERS: Sit to stand: Modified independence  Assistive device utilized: None     Stand to sit: Modified independence  Assistive device utilized: None     More difficult time getting up from soft,low surface-recliner, sofa, desk chair GAIT: Findings: Gait Characteristics: step through pattern, decreased step length- Right, decreased step length- Left, shuffling, festinating, narrow BOS, poor foot clearance- Right, and poor foot clearance- Left, Distance walked: 50 ft, Assistive device utilized:Single point cane, Level of assistance: SBA, and Comments: festination with turns, and difficulty turning fully to sit in chair   FUNCTIONAL TESTS:  5 times sit to stand: 9.72 sec with some posterior lean Timed up and go (TUG): 16.34 sec-decreased eccentric control to fully turn to sit 10 meter walk test: 11.1 sec = 2.95  ft/sec 360 turn R:  19 steps, 10.53 sec 360 turn L:  17 steps, 8.09 sec TUG manual:  18.06 sec-decreased eccentric control to sit TUG cognitive:  19.06 sec-decreased eccentric control, festination with turn to sit (nearly misses chair) 3 M walk back:  7.96 sec                                                                                                                               TREATMENT DATE: 05/04/2024    PATIENT EDUCATION: Education details: Eval results, POC Person educated: Patient Education method: Explanation Education comprehension: verbalized understanding  HOME EXERCISE PROGRAM: Not yet initiated  GOALS: Goals reviewed with patient? Yes  SHORT TERM GOALS: Target date: 06/03/2024  Pt will be independent with HEP for improved strength, balance, gait. Baseline: Goal status: MET  2.  Pt will improve 5x sit<>stand to less than or equal to 10 sec, with no episodes of posterior lean, to demonstrate improved functional strength and transfer efficiency. Baseline: 9.72 sec and 2 episodes of posterior lean 06/02/24: 9.65 sec and 1 episode of posterior lean 06/09/24: 9.94 sec no LOB Goal status: MET  3.  Pt will improve TUG score to less than or equal to 13.5 sec for decreased fall risk.  Baseline: 16.34 sec 06/02/24: 9.88 sec Goal status: MET   LONG TERM GOALS: Target date: 07/01/2024  Pt will be independent with HEP for improved strength, balance, gait. Baseline:  Goal status:  IN PROGRESS  2.  Pt will improve TUG manual/TUG cognitive score to less than or equal to 15 sec for decreased fall risk. Baseline: 18/19 sec 06/09/24: 10.75/12.9 sec with no a/d Goal status: MET  3.  Pt will improve MiniBESTest score to at least 18/28 to decrease fall risk. Baseline: 11/28 Goal status: IN PROGRESS  4.  Pt will perform 360 turns in 12 steps or less, for improved balance with turns. Baseline:  06/09/24: 7 steps to the R, 6 steps to the L Goal status: MET  5.  Pt will  report at least 50% improvement in low surface transfers. Baseline:  Goal status: IN PROGRESS  6.  Pt will verbalize plans for continued community fitness upon d/c from PT.  Baseline:  Goal status: IN PROGRESS    ASSESSMENT:  CLINICAL IMPRESSION: Patient arrived to session with report of mild R hip pain. This was addressed with TM walking for warm up. Proceeded with gait training with balance and speed challenges. Patient did well with speed changes but demonstrated incomplete turns and freezing with turns. Attempted technique with wt shift on wall which was partially successful. Pt noted some LBP after multiple standing tasks. No other complaints at end of session.   OBJECTIVE IMPAIRMENTS: Abnormal gait, decreased balance, decreased mobility, difficulty walking, decreased strength, and postural dysfunction.   ACTIVITY LIMITATIONS: carrying, sitting, standing, transfers, locomotion level, and caring for others  PARTICIPATION LIMITATIONS: meal prep, cleaning, laundry, and community activity  PERSONAL FACTORS: 3+ comorbidities: See PMH above are also affecting patient's functional outcome.   REHAB POTENTIAL: Good  CLINICAL DECISION MAKING: Evolving/moderate complexity  EVALUATION COMPLEXITY: Moderate  PLAN:  PT FREQUENCY: 2x/week  PT DURATION: 8 weeks plus eval  PLANNED INTERVENTIONS: 97750- Physical Performance Testing, 97110-Therapeutic exercises, 97530- Therapeutic activity, 97112- Neuromuscular re-education, 97535- Self Care, 02859- Manual therapy, [phone]- Gait training, Patient/Family education, and Balance training  PLAN FOR NEXT SESSION: Progress balance exercises, postural exercises, quadruped/tall kneeling exercises, consider resisted walking; work on turns, backwards walk, tips to decrease festination.   Louana Terrilyn Christians, PT, DPT 06/23/24 2:48 PM  [facility] [address] [place], [address] [place], [place]  [address] Phone # [phone] Fax # [phone]

## 2024-06-23 ENCOUNTER — Encounter: Payer: Self-pay | Admitting: Physical Therapy

## 2024-06-23 ENCOUNTER — Ambulatory Visit: Attending: Neurology | Admitting: Physical Therapy

## 2024-06-23 DIAGNOSIS — R29818 Other symptoms and signs involving the nervous system: Secondary | ICD-10-CM | POA: Diagnosis not present

## 2024-06-23 DIAGNOSIS — R2689 Other abnormalities of gait and mobility: Secondary | ICD-10-CM | POA: Insufficient documentation

## 2024-06-23 DIAGNOSIS — R471 Dysarthria and anarthria: Secondary | ICD-10-CM | POA: Diagnosis not present

## 2024-06-23 DIAGNOSIS — R293 Abnormal posture: Secondary | ICD-10-CM | POA: Diagnosis not present

## 2024-06-23 DIAGNOSIS — R2681 Unsteadiness on feet: Secondary | ICD-10-CM | POA: Insufficient documentation

## 2024-06-23 DIAGNOSIS — G20A1 Parkinson's disease without dyskinesia, without mention of fluctuations: Secondary | ICD-10-CM | POA: Diagnosis not present

## 2024-06-23 DIAGNOSIS — M6281 Muscle weakness (generalized): Secondary | ICD-10-CM | POA: Insufficient documentation

## 2024-06-27 ENCOUNTER — Ambulatory Visit

## 2024-06-27 ENCOUNTER — Encounter

## 2024-06-27 DIAGNOSIS — R471 Dysarthria and anarthria: Secondary | ICD-10-CM | POA: Diagnosis not present

## 2024-06-27 DIAGNOSIS — R293 Abnormal posture: Secondary | ICD-10-CM | POA: Diagnosis not present

## 2024-06-27 DIAGNOSIS — R2681 Unsteadiness on feet: Secondary | ICD-10-CM

## 2024-06-27 DIAGNOSIS — R2689 Other abnormalities of gait and mobility: Secondary | ICD-10-CM

## 2024-06-27 DIAGNOSIS — M6281 Muscle weakness (generalized): Secondary | ICD-10-CM | POA: Diagnosis not present

## 2024-06-27 DIAGNOSIS — R29818 Other symptoms and signs involving the nervous system: Secondary | ICD-10-CM | POA: Diagnosis not present

## 2024-06-27 DIAGNOSIS — G20A1 Parkinson's disease without dyskinesia, without mention of fluctuations: Secondary | ICD-10-CM

## 2024-06-27 NOTE — Therapy (Signed)
 OUTPATIENT PHYSICAL THERAPY NEURO TREATMENT NOTE     Patient Name: [name] [name] MRN: [id number] DOB:May 13, 1951, 73 y.o., male Today's Date: 06/27/2024   PCP: [facility]ann REFERRING PROVIDER: Evonnie Asberry RAMAN, DO   END OF SESSION:  PT End of Session - 06/27/24 1450     Visit Number 14    Number of Visits 18    Date for Recertification  07/01/24    Authorization Type Medicare/BCBS    Progress Note Due on Visit 10    PT Start Time 1445    PT Stop Time 1530    PT Time Calculation (min) 45 min    Equipment Utilized During Treatment Gait belt    Activity Tolerance Patient tolerated treatment well    Behavior During Therapy WFL for tasks assessed/performed                    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/27/2014   Mild neurocognitive disorder due to Parkinson's disease 01/10/2022   Non-Hodgkin lymphoma 2007   diffuse- 6 cycles of chemo with R CHOP Rituxan - last dose 10/08   Nonspecific elevation of levels of transaminase or lactic acid dehydrogenase (LDH) 07/06/2008   Parkinson's disease 07/27/2014   Post-splenectomy 04/02/2015   Past Surgical History:  Procedure Laterality Date   CHOLECYSTECTOMY, LAPAROSCOPIC     EYE SURGERY     x2 as child   PORT-A-CATH REMOVAL     PORTACATH PLACEMENT     SPLENECTOMY     TONSILLECTOMY     Patient Active Problem List   Diagnosis Date Noted   Mild neurocognitive disorder due to Parkinson's disease 01/10/2022   Gastroesophageal reflux disease 03/07/2020   Localized swelling of right lower extremity 04/12/2019   DOE (dyspnea  on exertion) 07/15/2018   10 year risk of MI or stroke 7.5% or greater 04/12/2018   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/2015   Meige syndrome (blepharospasm with oromandibular dystonia) 07/27/2014   Dysphagia, neurologic 07/27/2014   Parkinson's disease (HCC) 07/27/2014   Dystonia 07/10/2014   Low back pain 11/21/2011   Nonspecific elevation of levels of transaminase or lactic acid dehydrogenase (LDH) 07/06/2008   Enlarged lymph nodes 07/28/2007   Non-Hodgkin lymphoma 03/23/2007    ONSET DATE: 04/12/2024  REFERRING DIAG:  G20.A2 (ICD-10-CM) - Parkinson's disease without dyskinesia, with fluctuating manifestations (HCC)  R49.8 (ICD-10-CM) - Hypophonia  R47.1 (ICD-10-CM) - Dysarthria    THERAPY DIAG:  Unsteadiness on feet  Other abnormalities of gait and mobility  Muscle weakness (generalized)  Abnormal posture  Other symptoms and signs involving the nervous system  Parkinson's disease without dyskinesia or fluctuating manifestations (HCC)  Rationale for Evaluation and Treatment: Rehabilitation  SUBJECTIVE:  SUBJECTIVE STATEMENT: Doing ok a little sore in the hip from sitting  Pt accompanied by: self  PERTINENT HISTORY: Hx of Meige syndrome, insomnia, RBD, PD, GERD, MCI  PAIN:  Are you having pain? Yes: NPRS scale: 3/10 Pain location: R hip Pain description: sore Aggravating factors: sitting in car too long Relieving factors: stretching, walking  PRECAUTIONS: Fall; hypophonia  RED FLAGS: None   WEIGHT BEARING RESTRICTIONS: No  FALLS: Has patient fallen in last 6 months? Yes. Number of falls 2  LIVING ENVIRONMENT: Lives with: lives with their family Lives in: House/apartment Stairs: 3 main sets of stairs; bilateral rails for first set; L rails going up to third floor hobby room Has  following equipment at home: Single point cane and with small quad tip base; Has RW, but doesn't use  PLOF: Independent with household mobility with device and Independent with community mobility with device  PATIENT GOALS: To get back to where I was  OBJECTIVE:    TODAY'S TREATMENT: 06/27/24 Activity Comments  NU-step level 3 x 8 min, maintain 75+ SPM For warm-up and rapid alternating movement  Turning/freezing of gait strategies   Step-ups with reaction CGA  Gait training w/ dual tasking            TODAY'S TREATMENT: 06/23/24 Activity Comments  TM walking warmup 2.0 mph x 5 min B UE support; dynamic warm up   gait training + quick speed and direction changes, S turns around therapy poles  Worked through freezing episodes by keeping gaze ahead, wt shifting, maintaining smooth steps. Freezing evident with 1/2 turns. Good ability to change gait speed   Gait + 1/2 turns Freezing and incomplete turns evident   Wt shift on wall + pivot turn More successful with R turn but this was difficult for pt and needs more practice     PATIENT EDUCATION: Education details: POC and pt asking for continuation of therapy  Person educated: Patient Education method: Explanation Education comprehension: verbalized understanding    HOME EXERCISE PROGRAM: Access Code: [email or web address] URL: [email or web address] Date: 06/14/2024 Prepared by: Gellen April Earnie Starring  Exercises - Seated Pelvic Tilts  - 1 x daily - 7 x weekly - 2 sets - 10 reps - Half Deadlift with Kettlebell  - 1 x daily - 7 x weekly - 2 sets - 10 reps - Seated Cervical Retraction  - 3-4 x daily - 7 x weekly - 2 sets - 10 reps - Prone Shoulder W on Swiss Ball  - 1 x daily - 7 x weekly - 2 sets - 10 reps - Prone Shoulder Horizontal Abduction on Swiss Ball  - 1 x daily - 7 x weekly - 2 sets - 10 reps - Prone Shoulder Extension on Swiss Ball  - 1 x daily - 7 x weekly - 2 sets - 10 reps - Prone Shoulder Flexion on Swiss Ball   - 1 x daily - 7 x weekly - 2 sets - 10 reps - Standing March  - 1 x daily - 7 x weekly - 1 sets - 10 reps - 3-5 sec hold   PATIENT EDUCATION: Education details: HEP updates Person educated: Patient Education method: Explanation, Demonstration, and Handouts Education comprehension: verbalized understanding, returned demonstration, and needs further education  ---------------------------------------------- Note: Objective measures were completed at Evaluation unless otherwise noted.  DIAGNOSTIC FINDINGS: NA this episode  COGNITION: Overall cognitive status: Within functional limits for tasks assessed and History of cognitive impairments - at baseline   POSTURE: rounded shoulders, forward  head, posterior pelvic tilt, flexed trunk , and weight shift right  LOWER EXTREMITY ROM:   WFL seated  Active  Right Eval Left Eval  Hip flexion    Hip extension    Hip abduction    Hip adduction    Hip internal rotation    Hip external rotation    Knee flexion    Knee extension    Ankle dorsiflexion    Ankle plantarflexion    Ankle inversion    Ankle eversion     (Blank rows = not tested)  LOWER EXTREMITY MMT:    MMT Right Eval Left Eval  Hip flexion 4 4  Hip extension    Hip abduction 4 4  Hip adduction 4 4  Hip internal rotation    Hip external rotation    Knee flexion 4 4  Knee extension 4 4  Ankle dorsiflexion 4 4  Ankle plantarflexion    Ankle inversion    Ankle eversion    (Blank rows = not tested)  TRANSFERS: Sit to stand: Modified independence  Assistive device utilized: None     Stand to sit: Modified independence  Assistive device utilized: None     More difficult time getting up from soft,low surface-recliner, sofa, desk chair GAIT: Findings: Gait Characteristics: step through pattern, decreased step length- Right, decreased step length- Left, shuffling, festinating, narrow BOS, poor foot clearance- Right, and poor foot clearance- Left, Distance walked: 50 ft,  Assistive device utilized:Single point cane, Level of assistance: SBA, and Comments: festination with turns, and difficulty turning fully to sit in chair   FUNCTIONAL TESTS:  5 times sit to stand: 9.72 sec with some posterior lean Timed up and go (TUG): 16.34 sec-decreased eccentric control to fully turn to sit 10 meter walk test: 11.1 sec = 2.95 ft/sec 360 turn R:  19 steps, 10.53 sec 360 turn L:  17 steps, 8.09 sec TUG manual:  18.06 sec-decreased eccentric control to sit TUG cognitive:  19.06 sec-decreased eccentric control, festination with turn to sit (nearly misses chair) 3 M walk back:  7.96 sec                                                                                                                               TREATMENT DATE: 05/04/2024    PATIENT EDUCATION: Education details: Eval results, POC Person educated: Patient Education method: Explanation Education comprehension: verbalized understanding  HOME EXERCISE PROGRAM: Not yet initiated  GOALS: Goals reviewed with patient? Yes  SHORT TERM GOALS: Target date: 06/03/2024  Pt will be independent with HEP for improved strength, balance, gait. Baseline: Goal status: MET  2.  Pt will improve 5x sit<>stand to less than or equal to 10 sec, with no episodes of posterior lean, to demonstrate improved functional strength and transfer efficiency. Baseline: 9.72 sec and 2 episodes of posterior lean 06/02/24: 9.65 sec and 1 episode of posterior lean 06/09/24: 9.94 sec no LOB Goal status: MET  3.  Pt will improve TUG score to less than or equal to 13.5 sec for decreased fall risk.  Baseline: 16.34 sec 06/02/24: 9.88 sec Goal status: MET   LONG TERM GOALS: Target date: 07/01/2024  Pt will be independent with HEP for improved strength, balance, gait. Baseline:  Goal status: IN PROGRESS  2.  Pt will improve TUG manual/TUG cognitive score to less than or equal to 15 sec for decreased fall risk. Baseline: 18/19  sec 06/09/24: 10.75/12.9 sec with no a/d Goal status: MET  3.  Pt will improve MiniBESTest score to at least 18/28 to decrease fall risk. Baseline: 11/28 Goal status: IN PROGRESS  4.  Pt will perform 360 turns in 12 steps or less, for improved balance with turns. Baseline:  06/09/24: 7 steps to the R, 6 steps to the L Goal status: MET  5.  Pt will report at least 50% improvement in low surface transfers. Baseline:  Goal status: IN PROGRESS  6.  Pt will verbalize plans for continued community fitness upon d/c from PT.  Baseline:  Goal status: IN PROGRESS    ASSESSMENT:  CLINICAL IMPRESSION: Patient arrived to session with report of mild R hip pain NU-step for warm-up and rapid alternating movement followed by gait training strategies to improve freezing of gait and turning strategies with trials of tactile and visual/imagery reference with good carryover from 4-6 steps to 2-3 steps for 90 degree turns. Continued with gait training and dual-tasking to improve safety with demands and implementing strategies for FoG with good carryover and only occasional instances of festinating with good correction, no LOB.   OBJECTIVE IMPAIRMENTS: Abnormal gait, decreased balance, decreased mobility, difficulty walking, decreased strength, and postural dysfunction.   ACTIVITY LIMITATIONS: carrying, sitting, standing, transfers, locomotion level, and caring for others  PARTICIPATION LIMITATIONS: meal prep, cleaning, laundry, and community activity  PERSONAL FACTORS: 3+ comorbidities: See PMH above are also affecting patient's functional outcome.   REHAB POTENTIAL: Good  CLINICAL DECISION MAKING: Evolving/moderate complexity  EVALUATION COMPLEXITY: Moderate  PLAN:  PT FREQUENCY: 2x/week  PT DURATION: 8 weeks plus eval  PLANNED INTERVENTIONS: 97750- Physical Performance Testing, 97110-Therapeutic exercises, 97530- Therapeutic activity, 97112- Neuromuscular re-education, 97535- Self Care,  97140- Manual therapy, [phone]- Gait training, Patient/Family education, and Balance training  PLAN FOR NEXT SESSION: Progress balance exercises, postural exercises, quadruped/tall kneeling exercises, consider resisted walking; work on turns, backwards walk, tips to decrease festination.   4:56 PM, 06/27/24 M. Kelly Lanice Folden, PT, DPT Physical Therapist- [place] Office Number: [phone]

## 2024-06-29 ENCOUNTER — Encounter

## 2024-06-29 ENCOUNTER — Encounter: Payer: Self-pay | Admitting: Physical Therapy

## 2024-06-29 ENCOUNTER — Ambulatory Visit: Admitting: Physical Therapy

## 2024-06-29 DIAGNOSIS — R2689 Other abnormalities of gait and mobility: Secondary | ICD-10-CM | POA: Diagnosis not present

## 2024-06-29 DIAGNOSIS — M6281 Muscle weakness (generalized): Secondary | ICD-10-CM | POA: Diagnosis not present

## 2024-06-29 DIAGNOSIS — R29818 Other symptoms and signs involving the nervous system: Secondary | ICD-10-CM | POA: Diagnosis not present

## 2024-06-29 DIAGNOSIS — R471 Dysarthria and anarthria: Secondary | ICD-10-CM | POA: Diagnosis not present

## 2024-06-29 DIAGNOSIS — R293 Abnormal posture: Secondary | ICD-10-CM | POA: Diagnosis not present

## 2024-06-29 DIAGNOSIS — R2681 Unsteadiness on feet: Secondary | ICD-10-CM

## 2024-06-29 NOTE — Patient Instructions (Signed)
(  Exercise) Monday Tuesday Wednesday Thursday Friday Saturday Sunday   Posture exercises: W-reach T-Reach Shoulder extension Shoulder flexion reach  X  X  X     Balance: Marching in place Lateral weightshift   X  X  X    Sit to stand  X X X X X X X

## 2024-06-29 NOTE — Therapy (Signed)
 OUTPATIENT PHYSICAL THERAPY NEURO TREATMENT NOTE/DISCHARGE SUMMARY     Patient Name: [name] [name] MRN: [id number] DOB:06/21/1951, 73 y.o., male Today's Date: 06/29/2024   PCP: Frann REFERRING PROVIDER: Tat, Asberry RAMAN, DO   PHYSICAL THERAPY DISCHARGE SUMMARY  Visits from Start of Care: 15  Current functional level related to goals / functional outcomes: Pt has met all LTGs-see below   Remaining deficits: Balance, posture-improving; festination of gait-improving   Education / Equipment: Educated in [employer]; exercise chart for optimal HEP performance   Patient agrees to discharge. Patient goals were met. Patient is being discharged due to meeting the stated rehab goals.  He is pleased with his progress and current functional level.  Recommend PT screen in 6 months due to progressive nature of disease process.   END OF SESSION:  PT End of Session - 06/29/24 1451     Visit Number 15    Number of Visits 18    Authorization Type Medicare/BCBS    Progress Note Due on Visit 10    PT Start Time 1452    PT Stop Time 1536    PT Time Calculation (min) 44 min    Equipment Utilized During Treatment Gait belt    Activity Tolerance Patient tolerated treatment well    Behavior During Therapy WFL for tasks assessed/performed                     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/27/2014   Mild neurocognitive disorder due to Parkinson's disease 01/10/2022   Non-Hodgkin lymphoma 2007   diffuse- 6 cycles of chemo with R CHOP Rituxan - last dose 10/08   Nonspecific elevation  of levels of transaminase or lactic acid dehydrogenase (LDH) 07/06/2008   Parkinson's disease 07/27/2014   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/02/2015   Past Surgical History:  Procedure Laterality Date   CHOLECYSTECTOMY, LAPAROSCOPIC     EYE SURGERY     x2 as child   PORT-A-CATH REMOVAL     PORTACATH PLACEMENT     SPLENECTOMY     TONSILLECTOMY     Patient Active Problem List   Diagnosis Date Noted   Mild neurocognitive disorder due to Parkinson's disease 01/10/2022   Gastroesophageal reflux disease 03/07/2020   Localized swelling of right lower extremity 04/12/2019   DOE (dyspnea on exertion) 07/15/2018   10 year risk of MI or stroke 7.5% or greater 04/12/2018   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/02/2015   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/27/2014   Dysphagia, neurologic 07/27/2014   Parkinson's disease (HCC) 07/27/2014   Dystonia 07/10/2014   Low back pain 11/21/2011   Nonspecific elevation of levels of transaminase or lactic acid dehydrogenase (LDH) 07/06/2008   Enlarged lymph nodes 07/28/2007   Non-Hodgkin lymphoma 03/23/2007    ONSET DATE: 04/12/2024  REFERRING DIAG:  G20.A2 (ICD-10-CM) - Parkinson's disease without dyskinesia, with fluctuating manifestations (HCC)  R49.8 (ICD-10-CM) - Hypophonia  R47.1 (ICD-10-CM) - Dysarthria    THERAPY DIAG:  Unsteadiness on feet  Other abnormalities of gait and mobility  Muscle weakness (generalized)  Abnormal posture  Rationale for Evaluation and Treatment: Rehabilitation  SUBJECTIVE:                                                                                                                                                                                             SUBJECTIVE STATEMENT: Feel like I am moving better-the stretches are helping and the bottom squeezes help me feel taller.  Pt accompanied by: self  PERTINENT HISTORY: Hx of Meige syndrome, insomnia, RBD, PD, GERD, MCI  PAIN:  Are you having pain? Yes:  NPRS scale: 1-2/10 Pain location: low back Pain description: sore Aggravating factors: sitting in car too long Relieving factors: stretching, walking  PRECAUTIONS: Fall; hypophonia  RED FLAGS: None   WEIGHT BEARING RESTRICTIONS: No  FALLS: Has patient fallen in last 6 months? Yes. Number of falls 2  LIVING ENVIRONMENT: Lives with: lives with their family Lives in: House/apartment Stairs: 3 main sets of stairs; bilateral rails for first set; L rails going up to third floor hobby room Has following equipment at home: Single point cane and with small quad tip base; Has RW, but doesn't use  PLOF: Independent with household mobility with device and Independent with community mobility with device  PATIENT GOALS: To get back to where I was  OBJECTIVE:    TODAY'S TREATMENT: 06/29/2024 Activity Comments  FTSTS: 9.94 sec   TUG 10.37 sec TUG cognitive 11.37 sec TUG manual:  11.69 sec Mild unsteadiness with turn  3 M walk backwards:  5.16 sec   10 M walk:  9.31 = 3.52 ft/sec   MiniBESTest:  24/28 See flow sheet-improved from 11/28  Practice turns 360 turns Walking turns with cane to walk around   Review of HEP Pt has not been doing with therapy ball, but has been working on posture, balance exericses  Provided ex chart to help with compliance of HEP         [facility] PT Assessment - 06/29/24 1507       Mini-BESTest   Timed UP & GO with Dual Task Normal: No noticeable change in sitting, standing or walking while backward counting when compared to TUG without    Mini-BEST total score 24          PATIENT EDUCATION: Education details: Progress towards goals, POC; pt is agreeable to discharge and feels goals are met; provided with handout for exercise chart to keep HEP organized and consistent Person educated: Patient Education method: Explanation Education comprehension: verbalized understanding    HOME EXERCISE PROGRAM: Access Code: [email or web address] URL:  [email or web address] Date: 06/14/2024 Prepared by: Gellen April Marie Nonato  Exercises - Seated Pelvic Tilts  - 1 x daily - 7 x weekly -  2 sets - 10 reps - Half Deadlift with Kettlebell  - 1 x daily - 7 x weekly - 2 sets - 10 reps - Seated Cervical Retraction  - 3-4 x daily - 7 x weekly - 2 sets - 10 reps - Prone Shoulder W on Swiss Ball  - 1 x daily - 7 x weekly - 2 sets - 10 reps - Prone Shoulder Horizontal Abduction on Swiss Ball  - 1 x daily - 7 x weekly - 2 sets - 10 reps - Prone Shoulder Extension on Swiss Ball  - 1 x daily - 7 x weekly - 2 sets - 10 reps - Prone Shoulder Flexion on Swiss Ball  - 1 x daily - 7 x weekly - 2 sets - 10 reps - Standing March  - 1 x daily - 7 x weekly - 1 sets - 10 reps - 3-5 sec hold   ---------------------------------------------- Note: Objective measures were completed at Evaluation unless otherwise noted.  DIAGNOSTIC FINDINGS: NA this episode  COGNITION: Overall cognitive status: Within functional limits for tasks assessed and History of cognitive impairments - at baseline   POSTURE: rounded shoulders, forward head, posterior pelvic tilt, flexed trunk , and weight shift right  LOWER EXTREMITY ROM:   WFL seated  Active  Right Eval Left Eval  Hip flexion    Hip extension    Hip abduction    Hip adduction    Hip internal rotation    Hip external rotation    Knee flexion    Knee extension    Ankle dorsiflexion    Ankle plantarflexion    Ankle inversion    Ankle eversion     (Blank rows = not tested)  LOWER EXTREMITY MMT:    MMT Right Eval Left Eval  Hip flexion 4 4  Hip extension    Hip abduction 4 4  Hip adduction 4 4  Hip internal rotation    Hip external rotation    Knee flexion 4 4  Knee extension 4 4  Ankle dorsiflexion 4 4  Ankle plantarflexion    Ankle inversion    Ankle eversion    (Blank rows = not tested)  TRANSFERS: Sit to stand: Modified independence  Assistive device utilized: None      Stand to sit: Modified independence  Assistive device utilized: None     More difficult time getting up from soft,low surface-recliner, sofa, desk chair GAIT: Findings: Gait Characteristics: step through pattern, decreased step length- Right, decreased step length- Left, shuffling, festinating, narrow BOS, poor foot clearance- Right, and poor foot clearance- Left, Distance walked: 50 ft, Assistive device utilized:Single point cane, Level of assistance: SBA, and Comments: festination with turns, and difficulty turning fully to sit in chair   FUNCTIONAL TESTS:  5 times sit to stand: 9.72 sec with some posterior lean Timed up and go (TUG): 16.34 sec-decreased eccentric control to fully turn to sit 10 meter walk test: 11.1 sec = 2.95 ft/sec 360 turn R:  19 steps, 10.53 sec 360 turn L:  17 steps, 8.09 sec TUG manual:  18.06 sec-decreased eccentric control to sit TUG cognitive:  19.06 sec-decreased eccentric control, festination with turn to sit (nearly misses chair) 3 M walk back:  7.96 sec                     [facility] PT Assessment - 06/29/24 1507       Mini-BESTest   Timed UP & GO with Dual Task Normal: No  noticeable change in sitting, standing or walking while backward counting when compared to TUG without    Mini-BEST total score 24                                                                                                                   TREATMENT DATE: 05/04/2024    PATIENT EDUCATION: Education details: Eval results, POC Person educated: Patient Education method: Explanation Education comprehension: verbalized understanding  HOME EXERCISE PROGRAM: Not yet initiated  GOALS: Goals reviewed with patient? Yes  SHORT TERM GOALS: Target date: 06/03/2024  Pt will be independent with HEP for improved strength, balance, gait. Baseline: Goal status: MET  2.  Pt will improve 5x sit<>stand to less than or equal to 10 sec, with no episodes of posterior lean, to demonstrate  improved functional strength and transfer efficiency. Baseline: 9.72 sec and 2 episodes of posterior lean 06/02/24: 9.65 sec and 1 episode of posterior lean 06/09/24: 9.94 sec no LOB Goal status: MET  3.  Pt will improve TUG score to less than or equal to 13.5 sec for decreased fall risk.  Baseline: 16.34 sec 06/02/24: 9.88 sec Goal status: MET   LONG TERM GOALS: Target date: 07/01/2024  Pt will be independent with HEP for improved strength, balance, gait. Baseline:  Goal status: MET 06/29/2024  2.  Pt will improve TUG manual/TUG cognitive score to less than or equal to 15 sec for decreased fall risk. Baseline: 18/19 sec 06/09/24: 10.75/12.9 sec with no a/d Goal status: MET  3.  Pt will improve MiniBESTest score to at least 18/28 to decrease fall risk. Baseline: 11/28>24/28 06/29/2024 Goal status: MET, 06/29/2024  4.  Pt will perform 360 turns in 12 steps or less, for improved balance with turns. Baseline:  06/09/24: 7 steps to the R, 6 steps to the L Goal status: MET  5.  Pt will report at least 50% improvement in low surface transfers. Baseline: 100% improvement with low transfers. Goal status: MET,  06/29/2024  6.  Pt will verbalize plans for continued community fitness upon d/c from PT.  Baseline:  Goal status: MET 06/29/2024    ASSESSMENT:  CLINICAL IMPRESSION: Pt presents today and feels like he has made nice progress, is much more confident than when he started therapy. Skilled PT session focused on assessing LTGs and reviewing HEP/providing exercise chart to ensure optimal consistent performance going forward.   Pt has met all LTGs.  He has improved in all LTG measures, particularly 24/28 score on MiniBESTest, improved from 11/28.  He has improved gait velocity, 3 M walk backwards and TUG cognitive/manual scores.  He is at overall low fall risk and is able to verbalize/demo ways to improve turns and lessen festination/freezing episodes at home.  He is agreeable to  discharge, as he would like to focus on speech therapy, with those appointments starting next week.  He is appropriate for discharge this visit.   OBJECTIVE IMPAIRMENTS: Abnormal gait, decreased balance, decreased mobility, difficulty walking, decreased strength, and postural dysfunction.  ACTIVITY LIMITATIONS: carrying, sitting, standing, transfers, locomotion level, and caring for others  PARTICIPATION LIMITATIONS: meal prep, cleaning, laundry, and community activity  PERSONAL FACTORS: 3+ comorbidities: See PMH above are also affecting patient's functional outcome.   REHAB POTENTIAL: Good  CLINICAL DECISION MAKING: Evolving/moderate complexity  EVALUATION COMPLEXITY: Moderate  PLAN:  PT FREQUENCY: 2x/week  PT DURATION: 8 weeks plus eval  PLANNED INTERVENTIONS: 97750- Physical Performance Testing, 97110-Therapeutic exercises, 97530- Therapeutic activity, 97112- Neuromuscular re-education, 97535- Self Care, 02859- Manual therapy, [phone]- Gait training, Patient/Family education, and Balance training  PLAN FOR NEXT SESSION: Discharge PT; plans for PD screens in 6-9 months   Greig Anon, PT 06/29/24 3:58 PM Phone: [phone] Fax: [phone]   [facility] [address], [address] [place], [place] [address] Phone # [phone] Fax # [phone]

## 2024-07-04 ENCOUNTER — Ambulatory Visit

## 2024-07-04 DIAGNOSIS — R29818 Other symptoms and signs involving the nervous system: Secondary | ICD-10-CM | POA: Diagnosis not present

## 2024-07-04 DIAGNOSIS — M6281 Muscle weakness (generalized): Secondary | ICD-10-CM | POA: Diagnosis not present

## 2024-07-04 DIAGNOSIS — R471 Dysarthria and anarthria: Secondary | ICD-10-CM

## 2024-07-04 DIAGNOSIS — R2681 Unsteadiness on feet: Secondary | ICD-10-CM | POA: Diagnosis not present

## 2024-07-04 DIAGNOSIS — R293 Abnormal posture: Secondary | ICD-10-CM | POA: Diagnosis not present

## 2024-07-04 DIAGNOSIS — R2689 Other abnormalities of gait and mobility: Secondary | ICD-10-CM | POA: Diagnosis not present

## 2024-07-04 NOTE — Patient Instructions (Signed)
   TWICE A DAY!  - 10 ahs  - 10 sentences  - read out loud for 30 seconds, 6 times, with your BEST STRONGEST voice (read with intent and purpose)

## 2024-07-04 NOTE — Therapy (Signed)
 OUTPATIENT SPEECH LANGUAGE PATHOLOGY PARKINSON'S RE-EVALUATION   Patient Name: [name] [name] MRN: [id number] DOB:1950-12-02, 73 y.o., male Today's Date: 07/04/2024  PCP: Frann Netter, MD REFERRING PROVIDER: Evonnie Stabs, DO  END OF SESSION:  End of Session - 07/04/24 1651     Visit Number 2    Number of Visits 17    Date for Recertification  08/26/24    SLP Start Time 1545   14 mintues late   SLP Stop Time  1615    SLP Time Calculation (min) 30 min    Activity Tolerance Patient tolerated treatment well            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/27/2014   Mild neurocognitive disorder due to Parkinson's disease 01/10/2022   Non-Hodgkin lymphoma 2007   diffuse- 6 cycles of chemo with R CHOP Rituxan - last dose 10/08   Nonspecific elevation of levels of transaminase or lactic acid dehydrogenase (LDH) 07/06/2008   Parkinson's disease 07/27/2014   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/02/2015   Past Surgical History:  Procedure Laterality Date   CHOLECYSTECTOMY, LAPAROSCOPIC     EYE SURGERY     x2 as child   PORT-A-CATH REMOVAL     PORTACATH PLACEMENT     SPLENECTOMY     TONSILLECTOMY     Patient Active Problem List   Diagnosis Date Noted   Mild neurocognitive disorder due to Parkinson's disease 01/10/2022   Gastroesophageal reflux disease 03/07/2020   Localized swelling of right lower extremity 04/12/2019   DOE (dyspnea on exertion) 07/15/2018   10 year risk of MI or stroke 7.5% or greater 04/12/2018   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/2015   Meige syndrome (blepharospasm with  oromandibular dystonia) 07/27/2014   Dysphagia, neurologic 07/27/2014   Parkinson's disease (HCC) 07/27/2014   Dystonia 07/10/2014   Low back pain 11/21/2011   Nonspecific elevation of levels of transaminase or lactic acid dehydrogenase (LDH) 07/06/2008   Enlarged lymph nodes 07/28/2007   Non-Hodgkin lymphoma 03/23/2007    ONSET DATE: Dx in mid-2010's; script dated 04/12/24  REFERRING DIAG:  G20.A1 (ICD-10-CM) - Parkinson's disease without dyskinesia or fluctuating manifestations (HCC)      THERAPY DIAG:  Dysarthria and anarthria  Rationale for Evaluation and Treatment: Rehabilitation  SUBJECTIVE:   SUBJECTIVE STATEMENT:  (My voice) is better. PT helped a lot but it still isn't good.  Pt accompanied by: self  PERTINENT HISTORY: Meige syndrome, insomnia, RBD. Pt well known to this SLP - previous ST course d/c'd in January 2025 with pt having met all LTGs.   PAIN:  Are you having pain? No  FALLS: Has patient fallen in last 6 months?  No, See PT evaluation for details  PATIENT GOALS: Improve with speech loudness   OBJECTIVE:  Note: Objective measures were completed at Evaluation unless otherwise noted.  DIAGNOSTIC FINDINGS:  From neuropsych evaluation report dated April 2023:  Mr. Conant completed a comprehensive neuropsychological evaluation on 01/10/2022. Please refer to that encounter for the full report and recommendations. Briefly, results suggested isolated impaired performances across complex attention and semantic fluency. Performance variability was also exhibited across all aspects of  learning and memory. Regarding etiology, the most likely culprit for ongoing cognitive weakness remains his past history of Parkinson's disease. While processing speed was improved relative to what might be expected, dysfunction surrounding complex attention and encoding/retrieval deficits of memory are quite common in this illness. The fact that motor/gait dysfunction was noted years  prior to concern surrounding cognitive decline is also a typical timeline associated with Parkinson's disease. Mild anxiety and sleep dysfunction could further influence cognitive performances. Research surrounding cognitive side effects of pramipexole  is mixed; however, I cannot rule out an ongoing side effect contribution as well.   COGNITION: Overall cognitive status: Impaired Areas of impairment: Attention and Memory Comments: cont'd report of intermittent difficulty with divided attention; Compensates and uses alternating attention instead. He continues to use memory strategies from previous therapy courses, successfully.   MOTOR SPEECH: Overall motor speech: impaired Level of impairment: Word Respiration: thoracic breathing, clavicular breathing, and speaking on residual capacity Phonation: low vocal intensity  Resonance: WFL Articulation: Impaired: conversation  Intelligibility: Intelligibility reduced ;90-95%  Effective technique: increased vocal intensity  ORAL MOTOR EXAMINATION: Overall status: Impaired: Labial: Bilateral (ROM and Coordination) Lingual: Bilateral (ROM and Coordination) Facial: Left (Symmetry) Velum: Coordination (minimally sluggish) Comments: left eyelid and cheek musculature slightly drooped   OBJECTIVE VOICE ASSESSMENT: Sustained ah maximum phonation time: 11.35 seconds Sustained ah loudness average: 89 dB Oral reading (passage) loudness average: 77 dB (everyday sentences) Oral reading loudness range: 63-86 dB Conversational loudness average: 66 dB; after 60 seconds decr to average 64dB Conversational loudness range: 54 - 75dB Voice quality: breathy, strained, and aphonic Stimulability trials: Given SLP modeling and rare min cues, loudness average increased to 69dB (range of 55 to 79) in 45 seconds of a monologue about [employer]. After 60 seconds, loudness decreased to average 67dB  Completed audio recording of patients baseline voice without  cueing from SLP: Yes  Pt does not report difficulty with swallowing which does not warrant further evaluation.  PATIENT REPORTED OUTCOME MEASURES (PROM): Communication Effectiveness Survey: provided in first therapy session  TODAY'S TREATMENT:                                                                                                                                         DATE:   07/04/24: Pt needs PROM next session. Pt had re-eval today, as this is day 61 since evaluation. See objective voice assessment above, for changes. In general, pt's volume has slightly increased, but still below average. Today with loud /a/ pt generated average 89dB. SLP assisted pt in generating more applicable everyday sentences. Pt changed one of these. He produced these sentences with average 83 dB. In short conversational segments of 20 seconds, pt averaged 67 dB with loudness fade noted. SLP confirmed his practice regimen as 10 ah, 10 everyday sentences, and reading out loud for 3 minutes in 30 second intervals.   05/04/24: SLP worked with  pt with iPad to incr awareness of lower than WNL voice. Pt used granddad shouting voice to get more intentional speech with louder, WNL volume speech. SLP collaborated pt with reps of loud /a/ and his everyday sentences; SLP verified pt's everyday sentences would all still be pertinent/relevant - pt to change two (Alexa). SLP told him to pay close attention to things he says until next session and substitute another sentence into his list.  PATIENT EDUCATION: Education details: see treatment date Person educated: Patient Education method: Explanation, Demonstration, Verbal cues, and Handouts Education comprehension: verbalized understanding, returned demonstration, verbal cues required, and needs further education  HOME EXERCISE PROGRAM: Loud /a/ and everyday sentences, along with speech tasks matching ability level   GOALS: Goals reviewed with patient?  Yes  SHORT TERM GOALS: Target date: 07/29/24 (first ST not until week of 07/04/24)  Pt will produce loud /a/ with at least low 90s dB average over three sessions  Baseline: Goal status: INITIAL  2.  Pt will produce 16/20 sentence responses with 70dB over two sessions  Baseline:  Goal status: INITIAL  3.  Pt will produce volume average 69dB in 3 minutes simple conversation with rare min A over three sessions  Baseline:  Goal status: INITIAL  4.  Pt will generate abdominal breathing 80% of the time when engaging in 3 minutes simple conversation in 2 sessions  Baseline:  Goal status: INITIAL   LONG TERM GOALS: Target date: , 08/26/24  Pt will maintain average 69dB over 8 minute simple-mod complex conversation with rare min A in three sessions  Baseline:  Goal status: INITIAL  2.  Pt will remain 100% intelligible out of the speech room for 8 minute conversation, with rare nonverbal cues Baseline:  Goal status: INITIAL  3.  pt will use abdominal breathing 70% of the time in 8 minutes conversation in three sessions  Baseline:  Goal status: INITIAL  4.  Pt will score higher on CES than initial administration  Baseline:  Goal status: INITIAL   ASSESSMENT:  CLINICAL IMPRESSION: Re-evaluation today as it has been 61 days since eval. SLP changed STGs and LTG dates. Patient is a 73 y.o. male who was seen today for treatment of speech clarity who demonstrated dysarthria most c/b low voice volume due to Parkinson's disease. When asked to produce speech with increased intent, he improved volume in 1 minutes of simple conversation however volume decline noted after approx 30 seconds. Pt would benefit from skilled ST targeting loudness in conversation. At this time he does not endorse swallowing difficulty but SLP will monitor informally throughout therapy course and goal/s added PRN. Pt continues to use memory compensations targeted in previous therapy courses.  OBJECTIVE  IMPAIRMENTS: Objective impairments include attention, memory, and dysarthria. These impairments are limiting patient from ADLs/IADLs and effectively communicating at home and in community.Factors affecting potential to achieve goals and functional outcome are previous level of function and severity of impairments. Patient will benefit from skilled SLP services to address above impairments and improve overall function.  REHAB POTENTIAL: Good  PLAN:  SLP FREQUENCY: 2x/week  SLP DURATION: 8 weeks  PLANNED INTERVENTIONS: 92507 Treatment of speech (30 or 45 min) , Environmental controls, Cueing hierachy, Internal/external aids, Functional tasks, Multimodal communication approach, SLP instruction and feedback, Compensatory strategies, and Patient/family education    [facility], CCC-SLP 07/04/2024, 4:51 PM

## 2024-07-06 ENCOUNTER — Ambulatory Visit

## 2024-07-06 DIAGNOSIS — R471 Dysarthria and anarthria: Secondary | ICD-10-CM | POA: Diagnosis not present

## 2024-07-06 DIAGNOSIS — R293 Abnormal posture: Secondary | ICD-10-CM | POA: Diagnosis not present

## 2024-07-06 DIAGNOSIS — R2689 Other abnormalities of gait and mobility: Secondary | ICD-10-CM | POA: Diagnosis not present

## 2024-07-06 DIAGNOSIS — M6281 Muscle weakness (generalized): Secondary | ICD-10-CM | POA: Diagnosis not present

## 2024-07-06 DIAGNOSIS — R2681 Unsteadiness on feet: Secondary | ICD-10-CM | POA: Diagnosis not present

## 2024-07-06 DIAGNOSIS — R29818 Other symptoms and signs involving the nervous system: Secondary | ICD-10-CM | POA: Diagnosis not present

## 2024-07-06 NOTE — Therapy (Signed)
 OUTPATIENT SPEECH LANGUAGE PATHOLOGY [facility]   Patient Name: [name] [name] MRN: [id number] DOB:July 05, 1951, 73 y.o., male Today's Date: 07/06/2024  PCP: Frann Netter, MD REFERRING PROVIDER: Evonnie Stabs, DO  END OF SESSION:  End of Session - 07/06/24 1544     Visit Number 3    Number of Visits 17    Date for Recertification  08/26/24    SLP Start Time 1533    SLP Stop Time  1615    SLP Time Calculation (min) 42 min    Activity Tolerance Patient tolerated treatment well            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/27/2014   Mild neurocognitive disorder due to Parkinson's disease 01/10/2022   Non-Hodgkin lymphoma 2007   diffuse- 6 cycles of chemo with R CHOP Rituxan - last dose 10/08   Nonspecific elevation of levels of transaminase or lactic acid dehydrogenase (LDH) 07/06/2008   Parkinson's disease 07/27/2014   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   Past Surgical History:  Procedure Laterality Date   CHOLECYSTECTOMY, LAPAROSCOPIC     EYE SURGERY     x2 as child   PORT-A-CATH REMOVAL     PORTACATH PLACEMENT     SPLENECTOMY     TONSILLECTOMY     Patient Active Problem List   Diagnosis Date Noted   Mild neurocognitive disorder due to Parkinson's disease 01/10/2022   Gastroesophageal reflux disease 03/07/2020   Localized swelling of right lower extremity 04/12/2019   DOE (dyspnea on exertion) 07/15/2018   10 year risk of MI or stroke 7.5% or greater 04/12/2018   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/2015   Meige syndrome (blepharospasm with oromandibular dystonia)  07/27/2014   Dysphagia, neurologic 07/27/2014   Parkinson's disease (HCC) 07/27/2014   Dystonia 07/10/2014   Low back pain 11/21/2011   Nonspecific elevation of levels of transaminase or lactic acid dehydrogenase (LDH) 07/06/2008   Enlarged lymph nodes 07/28/2007   Non-Hodgkin lymphoma 03/23/2007    ONSET DATE: Dx in mid-2010's; script dated 04/12/24  REFERRING DIAG:  G20.A1 (ICD-10-CM) - Parkinson's disease without dyskinesia or fluctuating manifestations (HCC)      THERAPY DIAG:  Dysarthria and anarthria  Rationale for Evaluation and Treatment: Rehabilitation  SUBJECTIVE:   SUBJECTIVE STATEMENT:  I went to hand out Bibles today.  Pt accompanied by: self  PERTINENT HISTORY: Meige syndrome, insomnia, RBD. Pt well known to this SLP - previous ST course d/c'd in January 2025 with pt having met all LTGs.   PAIN:  Are you having pain? No  FALLS: Has patient fallen in last 6 months?  No, See PT evaluation for details  PATIENT GOALS: Improve with speech loudness   OBJECTIVE:  Note: Objective measures were completed at Evaluation unless otherwise noted.  DIAGNOSTIC FINDINGS:  From neuropsych evaluation report dated April 2023:  Mr. Fredericks completed a comprehensive neuropsychological evaluation on 01/10/2022. Please refer to that encounter for the full report and recommendations. Briefly, results suggested isolated impaired performances across complex attention and semantic fluency. Performance variability was also exhibited across all aspects of learning and memory. Regarding etiology, the most likely culprit for  ongoing cognitive weakness remains his past history of Parkinson's disease. While processing speed was improved relative to what might be expected, dysfunction surrounding complex attention and encoding/retrieval deficits of memory are quite common in this illness. The fact that motor/gait dysfunction was noted years prior to concern surrounding cognitive decline is also  a typical timeline associated with Parkinson's disease. Mild anxiety and sleep dysfunction could further influence cognitive performances. Research surrounding cognitive side effects of pramipexole  is mixed; however, I cannot rule out an ongoing side effect contribution as well.   PATIENT REPORTED OUTCOME MEASURES (PROM): Communication Effectiveness Survey: provided in first therapy session  TODAY'S TREATMENT:                                                                                                                                         DATE:   07/06/24: PT NEEDS PROM NEXT SESSION. Today with loud /a/ pt generated average 91dB. Pt produced everyday sentences with average 81 dB. With sentence responses pt generated average In short conversational segments of 20 seconds, pt averaged 67 dB with loudness fade noted. SLP asked pt to practice with loud /a/, everyday sentences, and sentence responses.  07/04/24: Pt needs PROM next session. Pt had re-eval today, as this is day 61 since evaluation. See objective voice assessment above, for changes. In general, pt's volume has slightly increased, but still below average. Today with loud /a/ pt generated average 89dB. SLP assisted pt in generating more applicable everyday sentences. Pt changed one of these. He produced these sentences with average 83 dB. In short conversational segments of 20 seconds, pt averaged 67 dB with loudness fade noted. SLP confirmed his practice regimen as 10 ah, 10 everyday sentences, and reading out loud for 3 minutes in 30 second intervals.   05/04/24: SLP worked with pt with iPad to incr awareness of lower than WNL voice. Pt used granddad shouting voice to get more intentional speech with louder, WNL volume speech. SLP collaborated pt with reps of loud /a/ and his everyday sentences; SLP verified pt's everyday sentences would all still be pertinent/relevant - pt to change two (Alexa). SLP told him to pay close attention  to things he says until next session and substitute another sentence into his list.  PATIENT EDUCATION: Education details: see treatment date Person educated: Patient Education method: Explanation, Demonstration, Verbal cues, and Handouts Education comprehension: verbalized understanding, returned demonstration, verbal cues required, and needs further education  HOME EXERCISE PROGRAM: Loud /a/ and everyday sentences, along with speech tasks matching ability level   GOALS: Goals reviewed with patient? Yes  SHORT TERM GOALS: Target date: 07/29/24 (first ST not until week of 07/04/24)  Pt will produce loud /a/ with at least low 90s dB average over three sessions  Baseline: Goal status: INITIAL  2.  Pt will produce 16/20 sentence responses with 70dB over two sessions  Baseline:  Goal status: INITIAL  3.  Pt will produce volume average 69dB in 3 minutes simple conversation with rare min A over three sessions  Baseline:  Goal status: INITIAL  4.  Pt will generate abdominal breathing 80% of the time when engaging in 3 minutes simple conversation in 2 sessions  Baseline:  Goal status: INITIAL   LONG TERM GOALS: Target date: , 08/26/24  Pt will maintain average 69dB over 8 minute simple-mod complex conversation with rare min A in three sessions  Baseline:  Goal status: INITIAL  2.  Pt will remain 100% intelligible out of the speech room for 8 minute conversation, with rare nonverbal cues Baseline:  Goal status: INITIAL  3.  pt will use abdominal breathing 70% of the time in 8 minutes conversation in three sessions  Baseline:  Goal status: INITIAL  4.  Pt will score higher on CES than initial administration  Baseline:  Goal status: INITIAL   ASSESSMENT:  CLINICAL IMPRESSION: Patient is a 73 y.o. male who was seen today for treatment of speech clarity who demonstrated dysarthria most c/b low voice volume due to Parkinson's disease. See treatment date above for today's  date for further details on today's session. When asked to produce speech with increased intent during day of eval, he improved volume in 1 minutes of simple conversation however volume decline noted after approx 30 seconds. Pt would benefit from skilled ST targeting loudness in conversation. At this time he does not endorse swallowing difficulty but SLP will monitor informally throughout therapy course and goal/s added PRN. Pt continues to use memory compensations targeted in previous therapy courses.  OBJECTIVE IMPAIRMENTS: Objective impairments include attention, memory, and dysarthria. These impairments are limiting patient from ADLs/IADLs and effectively communicating at home and in community.Factors affecting potential to achieve goals and functional outcome are previous level of function and severity of impairments. Patient will benefit from skilled SLP services to address above impairments and improve overall function.  REHAB POTENTIAL: Good  PLAN:  SLP FREQUENCY: 2x/week  SLP DURATION: 8 weeks  PLANNED INTERVENTIONS: 92507 Treatment of speech (30 or 45 min) , Environmental controls, Cueing hierachy, Internal/external aids, Functional tasks, Multimodal communication approach, SLP instruction and feedback, Compensatory strategies, and Patient/family education    [facility], CCC-SLP 07/06/2024, 3:45 PM

## 2024-07-11 ENCOUNTER — Ambulatory Visit

## 2024-07-11 DIAGNOSIS — R29818 Other symptoms and signs involving the nervous system: Secondary | ICD-10-CM | POA: Diagnosis not present

## 2024-07-11 DIAGNOSIS — R293 Abnormal posture: Secondary | ICD-10-CM | POA: Diagnosis not present

## 2024-07-11 DIAGNOSIS — R471 Dysarthria and anarthria: Secondary | ICD-10-CM | POA: Diagnosis not present

## 2024-07-11 DIAGNOSIS — R2681 Unsteadiness on feet: Secondary | ICD-10-CM | POA: Diagnosis not present

## 2024-07-11 DIAGNOSIS — M6281 Muscle weakness (generalized): Secondary | ICD-10-CM | POA: Diagnosis not present

## 2024-07-11 DIAGNOSIS — R2689 Other abnormalities of gait and mobility: Secondary | ICD-10-CM | POA: Diagnosis not present

## 2024-07-11 NOTE — Therapy (Signed)
 OUTPATIENT SPEECH LANGUAGE PATHOLOGY [facility]   Patient Name: [name] [name] MRN: [id number] DOB:1951/09/05, 73 y.o., male Today's Date: 07/11/2024  PCP: Frann Netter, MD REFERRING PROVIDER: Evonnie Stabs, DO  END OF SESSION:  End of Session - 07/11/24 1732     Visit Number 4    Number of Visits 17    Date for Recertification  08/26/24    SLP Start Time 1537    SLP Stop Time  1617    SLP Time Calculation (min) 40 min    Activity Tolerance Patient tolerated treatment well             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/27/2014   Mild neurocognitive disorder due to Parkinson's disease 01/10/2022   Non-Hodgkin lymphoma 2007   diffuse- 6 cycles of chemo with R CHOP Rituxan - last dose 10/08   Nonspecific elevation of levels of transaminase or lactic acid dehydrogenase (LDH) 07/06/2008   Parkinson's disease 07/27/2014   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/2015   Past Surgical History:  Procedure Laterality Date   CHOLECYSTECTOMY, LAPAROSCOPIC     EYE SURGERY     x2 as child   PORT-A-CATH REMOVAL     PORTACATH PLACEMENT     SPLENECTOMY     TONSILLECTOMY     Patient Active Problem List   Diagnosis Date Noted   Mild neurocognitive disorder due to Parkinson's disease 01/10/2022   Gastroesophageal reflux disease 03/07/2020   Localized swelling of right lower extremity 04/12/2019   DOE (dyspnea on exertion) 07/15/2018   10 year risk of MI or stroke 7.5% or greater 04/12/2018   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   Meige syndrome (blepharospasm with oromandibular dystonia)  07/27/2014   Dysphagia, neurologic 07/27/2014   Parkinson's disease (HCC) 07/27/2014   Dystonia 07/10/2014   Low back pain 11/21/2011   Nonspecific elevation of levels of transaminase or lactic acid dehydrogenase (LDH) 07/06/2008   Enlarged lymph nodes 07/28/2007   Non-Hodgkin lymphoma 03/23/2007    ONSET DATE: Dx in mid-2010's; script dated 04/12/24  REFERRING DIAG:  G20.A1 (ICD-10-CM) - Parkinson's disease without dyskinesia or fluctuating manifestations (HCC)      THERAPY DIAG:  Dysarthria and anarthria  Rationale for Evaluation and Treatment: Rehabilitation  SUBJECTIVE:   SUBJECTIVE STATEMENT:  I did my first batch (of /a/) in the car on the way here..  Pt accompanied by: self  PERTINENT HISTORY: Meige syndrome, insomnia, RBD. Pt well known to this SLP - previous ST course d/c'd in January 2025 with pt having met all LTGs.   PAIN:  Are you having pain? No  FALLS: Has patient fallen in last 6 months?  No, See PT evaluation for details  PATIENT GOALS: Improve with speech loudness   OBJECTIVE:  Note: Objective measures were completed at Evaluation unless otherwise noted.  DIAGNOSTIC FINDINGS:  From neuropsych evaluation report dated April 2023:  Mr. Toney completed a comprehensive neuropsychological evaluation on 01/10/2022. Please refer to that encounter for the full report and recommendations. Briefly, results suggested isolated impaired performances across complex attention and semantic fluency. Performance variability was also exhibited across all aspects of learning and  memory. Regarding etiology, the most likely culprit for ongoing cognitive weakness remains his past history of Parkinson's disease. While processing speed was improved relative to what might be expected, dysfunction surrounding complex attention and encoding/retrieval deficits of memory are quite common in this illness. The fact that motor/gait dysfunction was noted years prior to concern surrounding  cognitive decline is also a typical timeline associated with Parkinson's disease. Mild anxiety and sleep dysfunction could further influence cognitive performances. Research surrounding cognitive side effects of pramipexole  is mixed; however, I cannot rule out an ongoing side effect contribution as well.   PATIENT REPORTED OUTCOME MEASURES (PROM): Communication Effectiveness Survey: pt scored himself 13/32, with lower scores indicating more challenges communicating given pt's deficits. 1 (not at all effective) was scored for a conversation in a noisy environment, speaking when emotionally upset/angry, a conversation in a car, and a conversation at a distance.Other responses were 2.  TODAY'S TREATMENT:                                                                                                                                         DATE:   07/11/24: SLP provided pt with PROM with instructions to fill out as if it were day after evaluation. Scored as above.  Loud /a/ average generated average 90dB. Pt produced everyday sentences with average 81dB. With sentence responses pt generated average 69dB with rare min A for loudness and intent. In short conversational segments of 20-30 seconds, pt averaged 69dB with occasional mod A.   07/06/24: PT NEEDS PROM NEXT SESSION. Today with loud /a/ pt generated average 91dB. Pt produced everyday sentences with average 81 dB. With sentence responses pt generated average In short conversational segments of 20 seconds, pt averaged 67 dB with loudness fade noted. SLP asked pt to practice with loud /a/, everyday sentences, and sentence responses.  07/04/24: Pt needs PROM next session. Pt had re-eval today, as this is day 61 since evaluation. See objective voice assessment above, for changes. In general, pt's volume has slightly increased, but still below average. Today with loud /a/ pt generated average 89dB. SLP assisted pt in generating more applicable  everyday sentences. Pt changed one of these. He produced these sentences with average 83 dB. In short conversational segments of 20 seconds, pt averaged 67 dB with loudness fade noted. SLP confirmed his practice regimen as 10 ah, 10 everyday sentences, and reading out loud for 3 minutes in 30 second intervals.   05/04/24: SLP worked with pt with iPad to incr awareness of lower than WNL voice. Pt used granddad shouting voice to get more intentional speech with louder, WNL volume speech. SLP collaborated pt with reps of loud /a/ and his everyday sentences; SLP verified pt's everyday sentences would all still be pertinent/relevant - pt to change two (Alexa). SLP told him to pay close attention to things he says until next session and substitute another  sentence into his list.  PATIENT EDUCATION: Education details: see treatment date Person educated: Patient Education method: Explanation, Demonstration, Verbal cues, and Handouts Education comprehension: verbalized understanding, returned demonstration, verbal cues required, and needs further education  HOME EXERCISE PROGRAM: Loud /a/ and everyday sentences, along with speech tasks matching ability level   GOALS: Goals reviewed with patient? Yes  SHORT TERM GOALS: Target date: 07/29/24 (first ST not until week of 07/04/24)  Pt will produce loud /a/ with at least low 90s dB average over three sessions  Baseline: Goal status: INITIAL  2.  Pt will produce 16/20 sentence responses with 70dB over two sessions  Baseline:  Goal status: INITIAL  3.  Pt will produce volume average 69dB in 3 minutes simple conversation with rare min A over three sessions  Baseline:  Goal status: INITIAL  4.  Pt will generate abdominal breathing 80% of the time when engaging in 3 minutes simple conversation in 2 sessions  Baseline:  Goal status: INITIAL   LONG TERM GOALS: Target date: , 08/26/24  Pt will maintain average 69dB over 8 minute simple-mod complex  conversation with rare min A in three sessions  Baseline:  Goal status: INITIAL  2.  Pt will remain 100% intelligible out of the speech room for 8 minute conversation, with rare nonverbal cues Baseline:  Goal status: INITIAL  3.  pt will use abdominal breathing 70% of the time in 8 minutes conversation in three sessions  Baseline:  Goal status: INITIAL  4.  Pt will score higher on CES than initial administration  Baseline:  Goal status: INITIAL   ASSESSMENT:  CLINICAL IMPRESSION: Patient is a 73 y.o. male who was seen today for treatment of speech clarity who demonstrated dysarthria most c/b low voice volume due to Parkinson's disease. See treatment date above for today's date for further details on today's session. When asked to produce speech with increased intent during day of eval, he improved volume in 1 minutes of simple conversation however volume decline noted after approx 30 seconds. Pt would benefit from skilled ST targeting loudness in conversation. At this time he does not endorse swallowing difficulty but SLP will monitor informally throughout therapy course and goal/s added PRN. Pt continues to use memory compensations targeted in previous therapy courses.  OBJECTIVE IMPAIRMENTS: Objective impairments include attention, memory, and dysarthria. These impairments are limiting patient from ADLs/IADLs and effectively communicating at home and in community.Factors affecting potential to achieve goals and functional outcome are previous level of function and severity of impairments. Patient will benefit from skilled SLP services to address above impairments and improve overall function.  REHAB POTENTIAL: Good  PLAN:  SLP FREQUENCY: 2x/week  SLP DURATION: 8 weeks  PLANNED INTERVENTIONS: 92507 Treatment of speech (30 or 45 min) , Environmental controls, Cueing hierachy, Internal/external aids, Functional tasks, Multimodal communication approach, SLP instruction and feedback,  Compensatory strategies, and Patient/family education    [facility], CCC-SLP 07/11/2024, 5:47 PM

## 2024-07-12 ENCOUNTER — Ambulatory Visit: Payer: Medicare Other

## 2024-07-12 ENCOUNTER — Ambulatory Visit: Payer: Medicare Other | Admitting: Physical Therapy

## 2024-07-12 ENCOUNTER — Ambulatory Visit: Payer: Medicare Other | Admitting: Occupational Therapy

## 2024-07-13 ENCOUNTER — Ambulatory Visit

## 2024-07-13 DIAGNOSIS — M6281 Muscle weakness (generalized): Secondary | ICD-10-CM | POA: Diagnosis not present

## 2024-07-13 DIAGNOSIS — R471 Dysarthria and anarthria: Secondary | ICD-10-CM | POA: Diagnosis not present

## 2024-07-13 DIAGNOSIS — R293 Abnormal posture: Secondary | ICD-10-CM | POA: Diagnosis not present

## 2024-07-13 DIAGNOSIS — R2681 Unsteadiness on feet: Secondary | ICD-10-CM | POA: Diagnosis not present

## 2024-07-13 DIAGNOSIS — R29818 Other symptoms and signs involving the nervous system: Secondary | ICD-10-CM | POA: Diagnosis not present

## 2024-07-13 DIAGNOSIS — R2689 Other abnormalities of gait and mobility: Secondary | ICD-10-CM | POA: Diagnosis not present

## 2024-07-13 NOTE — Therapy (Signed)
 OUTPATIENT SPEECH LANGUAGE PATHOLOGY [facility]   Patient Name: [name] [name] MRN: [id number] DOB:05-Aug-1951, 73 y.o., male Today's Date: 07/13/2024  PCP: Frann Netter, MD REFERRING PROVIDER: Evonnie Stabs, DO  END OF SESSION:  End of Session - 07/13/24 1604     Visit Number 5    Number of Visits 17    Date for Recertification  08/26/24    SLP Start Time 1544   pt checked in 12 minutes late   SLP Stop Time  1615    SLP Time Calculation (min) 31 min    Activity Tolerance Patient tolerated treatment well             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   Mild neurocognitive disorder due to Parkinson's disease 01/10/2022   Non-Hodgkin lymphoma 2007   diffuse- 6 cycles of chemo with R CHOP Rituxan - last dose 10/08   Nonspecific elevation of levels of transaminase or lactic acid dehydrogenase (LDH) 07/06/2008   Parkinson's disease 07/27/2014   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   Past Surgical History:  Procedure Laterality Date   CHOLECYSTECTOMY, LAPAROSCOPIC     EYE SURGERY     x2 as child   PORT-A-CATH REMOVAL     PORTACATH PLACEMENT     SPLENECTOMY     TONSILLECTOMY     Patient Active Problem List   Diagnosis Date Noted   Mild neurocognitive disorder due to Parkinson's disease 01/10/2022   Gastroesophageal reflux disease 03/07/2020   Localized swelling of right lower extremity 04/12/2019   DOE (dyspnea on exertion) 07/15/2018   10 year risk of MI or stroke 7.5% or greater 04/12/2018   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  Rationale for Evaluation and Treatment: Rehabilitation  SUBJECTIVE:   SUBJECTIVE STATEMENT:  I did my first batch (of /a/) in the car on the way here..  Pt accompanied by: self  PERTINENT HISTORY: Meige syndrome, insomnia, RBD. Pt well known to this SLP - previous ST course d/c'd in January 2025 with pt having met all LTGs.   PAIN:  Are you having pain? No  FALLS: Has patient fallen in last 6 months?  No, See PT evaluation for details  PATIENT GOALS: Improve with speech loudness   OBJECTIVE:  Note: Objective measures were completed at Evaluation unless otherwise noted.  DIAGNOSTIC FINDINGS:  From neuropsych evaluation report dated April 2023:  Mr. Rivero completed a comprehensive neuropsychological evaluation on 01/10/2022. Please refer to that encounter for the full report and recommendations. Briefly, results suggested isolated impaired performances across complex attention and semantic fluency. Performance variability was also  exhibited across all aspects of learning and memory. Regarding etiology, the most likely culprit for ongoing cognitive weakness remains his past history of Parkinson's disease. While processing speed was improved relative to what might be expected, dysfunction surrounding complex attention and encoding/retrieval deficits of memory are quite common in this illness. The fact that motor/gait dysfunction was noted years  prior to concern surrounding cognitive decline is also a typical timeline associated with Parkinson's disease. Mild anxiety and sleep dysfunction could further influence cognitive performances. Research surrounding cognitive side effects of pramipexole  is mixed; however, I cannot rule out an ongoing side effect contribution as well.   PATIENT REPORTED OUTCOME MEASURES (PROM): Communication Effectiveness Survey: pt scored himself 13/32, with lower scores indicating more challenges communicating given pt's deficits. 1 (not at all effective) was scored for a conversation in a noisy environment, speaking when emotionally upset/angry, a conversation in a car, and a conversation at a distance.Other responses were 2.  TODAY'S TREATMENT:                                                                                                                                         DATE:   07/13/24: SLP collaborated with pt to assist him in real-time feedback for louder speech in 60-second conversational segments. Pt req'd mod-max A usually for louder speech, average 66dB. Pt told SLP that if an emotional topic he will get softer consistently. After loud /a/ average 92dB, pt's loudness increased to 69dB over the next 3 mintues. After this loudness decreased again to average 67dB. SLP told pt he needed to practice each day and used this example to verify when pt practices his voice is louder. Everyday sentences were average 83dB.  07/11/24: SLP provided pt with PROM with instructions to fill out as if it were day after evaluation. Scored as above.  Loud /a/ average generated average 90dB. Pt produced everyday sentences with average 81dB. With sentence responses pt generated average 69dB with rare min A for loudness and intent. In short conversational segments of 20-30 seconds, pt averaged 69dB with occasional mod A.   07/06/24: PT NEEDS PROM NEXT SESSION. Today with loud /a/ pt generated average 91dB. Pt produced  everyday sentences with average 81 dB. With sentence responses pt generated average In short conversational segments of 20 seconds, pt averaged 67 dB with loudness fade noted. SLP asked pt to practice with loud /a/, everyday sentences, and sentence responses.  07/04/24: Pt needs PROM next session. Pt had re-eval today, as this is day 61 since evaluation. See objective voice assessment above, for changes. In general, pt's volume has slightly increased, but still below average. Today with loud /a/ pt generated average 89dB. SLP assisted pt in generating more applicable everyday sentences. Pt changed one of these. He produced these sentences with average 83 dB. In short conversational segments of 20 seconds, pt averaged 67 dB with loudness fade noted.  SLP confirmed his practice regimen as 10 ah, 10 everyday sentences, and reading out loud for 3 minutes in 30 second intervals.   05/04/24: SLP worked with pt with iPad to incr awareness of lower than WNL voice. Pt used granddad shouting voice to get more intentional speech with louder, WNL volume speech. SLP collaborated pt with reps of loud /a/ and his everyday sentences; SLP verified pt's everyday sentences would all still be pertinent/relevant - pt to change two (Alexa). SLP told him to pay close attention to things he says until next session and substitute another sentence into his list.  PATIENT EDUCATION: Education details: see treatment date Person educated: Patient Education method: Explanation, Demonstration, Verbal cues, and Handouts Education comprehension: verbalized understanding, returned demonstration, verbal cues required, and needs further education  HOME EXERCISE PROGRAM: Loud /a/ and everyday sentences, along with speech tasks matching ability level   GOALS: Goals reviewed with patient? Yes  SHORT TERM GOALS: Target date: 07/29/24 (first ST not until week of 07/04/24)  Pt will produce loud /a/ with at least low 90s dB average  over three sessions  Baseline: Goal status: INITIAL  2.  Pt will produce 16/20 sentence responses with 70dB over two sessions  Baseline:  Goal status: INITIAL  3.  Pt will produce volume average 69dB in 3 minutes simple conversation with rare min A over three sessions  Baseline:  Goal status: INITIAL  4.  Pt will generate abdominal breathing 80% of the time when engaging in 3 minutes simple conversation in 2 sessions  Baseline:  Goal status: INITIAL   LONG TERM GOALS: Target date: , 08/26/24  Pt will maintain average 69dB over 8 minute simple-mod complex conversation with rare min A in three sessions  Baseline:  Goal status: INITIAL  2.  Pt will remain 100% intelligible out of the speech room for 8 minute conversation, with rare nonverbal cues Baseline:  Goal status: INITIAL  3.  pt will use abdominal breathing 70% of the time in 8 minutes conversation in three sessions  Baseline:  Goal status: INITIAL  4.  Pt will score higher on CES than initial administration  Baseline:  Goal status: INITIAL   ASSESSMENT:  CLINICAL IMPRESSION: Patient is a 73 y.o. male who was seen today for treatment of speech clarity who demonstrated dysarthria most c/b low voice volume due to Parkinson's disease. See treatment date above for today's date for further details on today's session. When asked to produce speech with increased intent during day of eval, he improved volume in 1 minutes of simple conversation however volume decline noted after approx 30 seconds. Pt would benefit from skilled ST targeting loudness in conversation. At this time he does not endorse swallowing difficulty but SLP will monitor informally throughout therapy course and goal/s added PRN. Pt continues to use memory compensations targeted in previous therapy courses.  OBJECTIVE IMPAIRMENTS: Objective impairments include attention, memory, and dysarthria. These impairments are limiting patient from ADLs/IADLs and  effectively communicating at home and in community.Factors affecting potential to achieve goals and functional outcome are previous level of function and severity of impairments. Patient will benefit from skilled SLP services to address above impairments and improve overall function.  REHAB POTENTIAL: Good  PLAN:  SLP FREQUENCY: 2x/week  SLP DURATION: 8 weeks  PLANNED INTERVENTIONS: 92507 Treatment of speech (30 or 45 min) , Environmental controls, Cueing hierachy, Internal/external aids, Functional tasks, Multimodal communication approach, SLP instruction and feedback, Compensatory strategies, and Patient/family education    [facility], CCC-SLP  07/13/2024, 4:04 PM

## 2024-07-14 ENCOUNTER — Ambulatory Visit: Admitting: Family Medicine

## 2024-07-14 ENCOUNTER — Encounter: Payer: Self-pay | Admitting: Family Medicine

## 2024-07-14 VITALS — BP 110/72 | HR 68 | Temp 97.6°F | Resp 18 | Ht 65.0 in | Wt 189.6 lb

## 2024-07-14 DIAGNOSIS — N3941 Urge incontinence: Secondary | ICD-10-CM | POA: Diagnosis not present

## 2024-07-14 DIAGNOSIS — L309 Dermatitis, unspecified: Secondary | ICD-10-CM | POA: Diagnosis not present

## 2024-07-14 DIAGNOSIS — Z23 Encounter for immunization: Secondary | ICD-10-CM

## 2024-07-14 MED ORDER — TRIAMCINOLONE ACETONIDE 0.5 % EX OINT: 1.0000 | TOPICAL_OINTMENT | Freq: Two times a day (BID) | CUTANEOUS | 0 refills | Status: DC

## 2024-07-14 MED ORDER — TROSPIUM CHLORIDE ER 60 MG PO CP24: 1.0000 | ORAL_CAPSULE | Freq: Every day | ORAL | 1 refills | Status: DC

## 2024-07-14 NOTE — Patient Instructions (Signed)
 Let me know if there are cost issues with the new medicine please.  Let us  know if you need anything.

## 2024-07-14 NOTE — Progress Notes (Signed)
 Chief Complaint  Patient presents with   Urinary Incontinence    Pt reports he did not start Mirabegron  rx due to price of medication   Follow-up    Subjective: Patient is a 73 y.o. male here for f/u incontinence.  Was unable to start Myrbetriq  2/2 cost. No change in s/s's, still having leakage and difficulty getting to restroom. No pain, drainage, bleeding.   Patient took triamcinolone  0.1% for his legs.  He reports they are around 50% better but he ran out of cream.  He is requesting a refill.  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/27/2014   Mild neurocognitive disorder due to Parkinson's disease 01/10/2022   Non-Hodgkin lymphoma 2007   diffuse- 6 cycles of chemo with R CHOP Rituxan - last dose 10/08   Nonspecific elevation of levels of transaminase or lactic acid dehydrogenase (LDH) 07/06/2008   Parkinson's disease 07/27/2014   Post-splenectomy 04/02/2015    Objective: BP 110/72 (BP Location: Left Arm, Patient Position: Sitting, Cuff Size: Normal)   Pulse 68   Temp 97.6 F (36.4 C) (Oral)   Resp 18   Ht 5' 5 (1.651 m)   Wt 189 lb 9.6 oz (86 kg)   SpO2 95%   BMI 31.55 kg/m  General: Awake, appears stated age Skin: Pinkish patches over the posterior left leg smaller and less intense in color.  Left anterior skin lesion appears unchanged from previous. Lungs: No accessory muscle use Psych: Age appropriate judgment and insight, normal affect and mood  Assessment and Plan: Urge incontinence - Plan: Trospium Chloride 60 MG CP24  Dermatitis - Plan: triamcinolone  ointment (KENALOG )  0.5 %  Need for influenza vaccination - Plan: Flu vaccine HIGH DOSE PF(Fluzone Trivalent)  Chronic, not controlled.  Insurance would not cover beta 3 agonist.  Trial trospium 60 mg daily.  He will let me know if there are cost issues. Increase triamcinolone  dosage from 0.1% to 0.5%.  Would consider biopsy if no improvement versus referral to dermatology. Flu shot today. The patient voiced understanding and agreement to the plan.  Mabel [place], DO 07/14/24  12:33 PM

## 2024-07-18 ENCOUNTER — Ambulatory Visit

## 2024-07-20 ENCOUNTER — Ambulatory Visit

## 2024-07-20 DIAGNOSIS — R471 Dysarthria and anarthria: Secondary | ICD-10-CM

## 2024-07-20 DIAGNOSIS — R2681 Unsteadiness on feet: Secondary | ICD-10-CM | POA: Diagnosis not present

## 2024-07-20 DIAGNOSIS — R29818 Other symptoms and signs involving the nervous system: Secondary | ICD-10-CM | POA: Diagnosis not present

## 2024-07-20 DIAGNOSIS — R2689 Other abnormalities of gait and mobility: Secondary | ICD-10-CM | POA: Diagnosis not present

## 2024-07-20 DIAGNOSIS — M6281 Muscle weakness (generalized): Secondary | ICD-10-CM | POA: Diagnosis not present

## 2024-07-20 DIAGNOSIS — R293 Abnormal posture: Secondary | ICD-10-CM | POA: Diagnosis not present

## 2024-07-20 NOTE — Therapy (Signed)
 OUTPATIENT SPEECH LANGUAGE PATHOLOGY [facility]   Patient Name: [name] [name] MRN: [id number] DOB:Oct 18, 1950, 73 y.o., male Today's Date: 07/20/2024  PCP: Frann Netter, MD REFERRING PROVIDER: Evonnie Stabs, DO  END OF SESSION:  End of Session - 07/20/24 1550     Visit Number 6    Number of Visits 17    Date for Recertification  08/26/24    SLP Start Time 1537   in bathroom at 1532   SLP Stop Time  1615    SLP Time Calculation (min) 38 min    Activity Tolerance Patient tolerated treatment well             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/27/2014   Mild neurocognitive disorder due to Parkinson's disease 01/10/2022   Non-Hodgkin lymphoma 2007   diffuse- 6 cycles of chemo with R CHOP Rituxan - last dose 10/08   Nonspecific elevation of levels of transaminase or lactic acid dehydrogenase (LDH) 07/06/2008   Parkinson's disease 07/27/2014   Post-splenectomy 04/02/2015   Past Surgical History:  Procedure Laterality Date   CHOLECYSTECTOMY, LAPAROSCOPIC     EYE SURGERY     x2 as child   PORT-A-CATH REMOVAL     PORTACATH PLACEMENT     SPLENECTOMY     TONSILLECTOMY     Patient Active Problem List   Diagnosis Date Noted   Mild neurocognitive disorder due to Parkinson's disease 01/10/2022   Gastroesophageal reflux disease 03/07/2020   Localized swelling of right lower extremity 04/12/2019   DOE (dyspnea on exertion) 07/15/2018   10 year risk of MI or stroke 7.5% or greater 04/12/2018   Post-splenectomy 04/02/2015   Meige syndrome (blepharospasm with  oromandibular dystonia) 07/27/2014   Dysphagia, neurologic 07/27/2014   Parkinson's disease (HCC) 07/27/2014   Dystonia 07/10/2014   Low back pain 11/21/2011   Nonspecific elevation of levels of transaminase or lactic acid dehydrogenase (LDH) 07/06/2008   Enlarged lymph nodes 07/28/2007   Non-Hodgkin lymphoma 03/23/2007    ONSET DATE: Dx in mid-2010's; script dated 04/12/24  REFERRING DIAG:  G20.A1 (ICD-10-CM) - Parkinson's disease without dyskinesia or fluctuating manifestations (HCC)      THERAPY DIAG:  No diagnosis found.  Rationale for Evaluation and Treatment: Rehabilitation  SUBJECTIVE:   SUBJECTIVE STATEMENT:  My voice is so soft today.  Pt accompanied by: self  PERTINENT HISTORY: Meige syndrome, insomnia, RBD. Pt well known to this SLP - previous ST course d/c'd in January 2025 with pt having met all LTGs.   PAIN:  Are you having pain? No  FALLS: Has patient fallen in last 6 months?  No, See PT evaluation for details  PATIENT GOALS: Improve with speech loudness   OBJECTIVE:  Note: Objective measures were completed at Evaluation unless otherwise noted.  DIAGNOSTIC FINDINGS:  From neuropsych evaluation report dated April 2023:  Mr. Trickey completed a comprehensive neuropsychological evaluation on 01/10/2022. Please refer to that encounter for the full report and recommendations. Briefly, results suggested isolated impaired performances across complex attention and semantic fluency. Performance variability was also exhibited across all aspects of learning and memory. Regarding etiology,  the most likely culprit for ongoing cognitive weakness remains his past history of Parkinson's disease. While processing speed was improved relative to what might be expected, dysfunction surrounding complex attention and encoding/retrieval deficits of memory are quite common in this illness. The fact that motor/gait dysfunction was noted years prior to concern surrounding cognitive  decline is also a typical timeline associated with Parkinson's disease. Mild anxiety and sleep dysfunction could further influence cognitive performances. Research surrounding cognitive side effects of pramipexole  is mixed; however, I cannot rule out an ongoing side effect contribution as well.   PATIENT REPORTED OUTCOME MEASURES (PROM): Communication Effectiveness Survey: pt scored himself 13/32, with lower scores indicating more challenges communicating given pt's deficits. 1 (not at all effective) was scored for a conversation in a noisy environment, speaking when emotionally upset/angry, a conversation in a car, and a conversation at a distance.Other responses were 2.  TODAY'S TREATMENT:                                                                                                                                         DATE:   07/20/24: Pt arrived with volume 67dB in conversation. SLP with occasional mod cues for improved volume but limited response from pt. SLP guided pt through loud /a/ and everyday sentences and pt's conversational speech after this was WNL volume. SLP directed pt with sentence level responses with pt's volume average 70dB with occasional min A for improved volume and intent. After 7 mintues of task SLP req'd to provide usual min-mod A. SLP provided homework for pt to complete 15 minutes daily for incr'd loudness/intent with speech.  07/13/24: SLP collaborated with pt to assist him in real-time feedback for louder speech in 60-second conversational segments. Pt req'd mod-max A usually for louder speech, average 66dB. Pt told SLP that if an emotional topic he will get softer consistently. After loud /a/ average 92dB, pt's loudness increased to 69dB over the next 3 mintues. After this loudness decreased again to average 67dB. SLP told pt he needed to practice each day and used this example to verify when pt practices his voice is louder. Everyday sentences were average  83dB.  07/11/24: SLP provided pt with PROM with instructions to fill out as if it were day after evaluation. Scored as above.  Loud /a/ average generated average 90dB. Pt produced everyday sentences with average 81dB. With sentence responses pt generated average 69dB with rare min A for loudness and intent. In short conversational segments of 20-30 seconds, pt averaged 69dB with occasional mod A.   07/06/24: PT NEEDS PROM NEXT SESSION. Today with loud /a/ pt generated average 91dB. Pt produced everyday sentences with average 81 dB. With sentence responses pt generated average In short conversational segments of 20 seconds, pt averaged 67 dB with loudness fade noted. SLP asked pt to practice with loud /a/, everyday sentences, and sentence responses.  07/04/24: Pt  needs PROM next session. Pt had re-eval today, as this is day 61 since evaluation. See objective voice assessment above, for changes. In general, pt's volume has slightly increased, but still below average. Today with loud /a/ pt generated average 89dB. SLP assisted pt in generating more applicable everyday sentences. Pt changed one of these. He produced these sentences with average 83 dB. In short conversational segments of 20 seconds, pt averaged 67 dB with loudness fade noted. SLP confirmed his practice regimen as 10 ah, 10 everyday sentences, and reading out loud for 3 minutes in 30 second intervals.   05/04/24: SLP worked with pt with iPad to incr awareness of lower than WNL voice. Pt used granddad shouting voice to get more intentional speech with louder, WNL volume speech. SLP collaborated pt with reps of loud /a/ and his everyday sentences; SLP verified pt's everyday sentences would all still be pertinent/relevant - pt to change two (Alexa). SLP told him to pay close attention to things he says until next session and substitute another sentence into his list.  PATIENT EDUCATION: Education details: see treatment date Person  educated: Patient Education method: Explanation, Demonstration, Verbal cues, and Handouts Education comprehension: verbalized understanding, returned demonstration, verbal cues required, and needs further education  HOME EXERCISE PROGRAM: Loud /a/ and everyday sentences, along with speech tasks matching ability level   GOALS: Goals reviewed with patient? Yes  SHORT TERM GOALS: Target date: 07/29/24 (first ST not until week of 07/04/24)  Pt will produce loud /a/ with at least low 90s dB average over three sessions  Baseline: Goal status: INITIAL  2.  Pt will produce 16/20 sentence responses with 70dB over two sessions  Baseline:  Goal status: INITIAL  3.  Pt will produce volume average 69dB in 3 minutes simple conversation with rare min A over three sessions  Baseline:  Goal status: INITIAL  4.  Pt will generate abdominal breathing 80% of the time when engaging in 3 minutes simple conversation in 2 sessions  Baseline:  Goal status: INITIAL   LONG TERM GOALS: Target date: , 08/26/24  Pt will maintain average 69dB over 8 minute simple-mod complex conversation with rare min A in three sessions  Baseline:  Goal status: INITIAL  2.  Pt will remain 100% intelligible out of the speech room for 8 minute conversation, with rare nonverbal cues Baseline:  Goal status: INITIAL  3.  pt will use abdominal breathing 70% of the time in 8 minutes conversation in three sessions  Baseline:  Goal status: INITIAL  4.  Pt will score higher on CES than initial administration  Baseline:  Goal status: INITIAL   ASSESSMENT:  CLINICAL IMPRESSION: Patient is a 73 y.o. male who was seen today for treatment of speech clarity who demonstrated dysarthria most c/b low voice volume due to Parkinson's disease. See treatment date above for today's date for further details on today's session. When asked to produce speech with increased intent during day of eval, he improved volume in 1 minutes of  simple conversation however volume decline noted after approx 30 seconds. Pt would benefit from skilled ST targeting loudness in conversation. At this time he does not endorse swallowing difficulty but SLP will monitor informally throughout therapy course and goal/s added PRN. Pt continues to use memory compensations targeted in previous therapy courses.  OBJECTIVE IMPAIRMENTS: Objective impairments include attention, memory, and dysarthria. These impairments are limiting patient from ADLs/IADLs and effectively communicating at home and in community.Factors affecting potential to achieve goals and  functional outcome are previous level of function and severity of impairments. Patient will benefit from skilled SLP services to address above impairments and improve overall function.  REHAB POTENTIAL: Good  PLAN:  SLP FREQUENCY: 2x/week  SLP DURATION: 8 weeks  PLANNED INTERVENTIONS: 92507 Treatment of speech (30 or 45 min) , Environmental controls, Cueing hierachy, Internal/external aids, Functional tasks, Multimodal communication approach, SLP instruction and feedback, Compensatory strategies, and Patient/family education    [facility], CCC-SLP 07/20/2024, 4:07 PM

## 2024-07-26 ENCOUNTER — Ambulatory Visit: Attending: Neurology

## 2024-07-26 DIAGNOSIS — R471 Dysarthria and anarthria: Secondary | ICD-10-CM | POA: Insufficient documentation

## 2024-07-26 NOTE — Therapy (Unsigned)
 OUTPATIENT SPEECH LANGUAGE PATHOLOGY [facility]   Patient Name: [name] [name] MRN: [id number] DOB:[birth year], 73 y.o., male Today's Date: 07/26/2024  PCP: [name] Netter, MD REFERRING PROVIDER: Evonnie Stabs, DO  END OF SESSION:  End of Session - 07/26/24 1513     Visit Number 7    Number of Visits 17    Date for Recertification  08/26/24    SLP Start Time 1450    SLP Stop Time  1530    SLP Time Calculation (min) 40 min    Activity Tolerance Patient tolerated treatment well             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/27/2014   Mild neurocognitive disorder due to Parkinson's disease 01/10/2022   Non-Hodgkin lymphoma 2007   diffuse- 6 cycles of chemo with R CHOP Rituxan - last dose 10/08   Nonspecific elevation of levels of transaminase or lactic acid dehydrogenase (LDH) 07/06/2008   Parkinson's disease 07/27/2014   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   Past Surgical History:  Procedure Laterality Date   CHOLECYSTECTOMY, LAPAROSCOPIC     EYE SURGERY     x2 as child   PORT-A-CATH REMOVAL     PORTACATH PLACEMENT     SPLENECTOMY     TONSILLECTOMY     Patient Active Problem List   Diagnosis Date Noted   Mild neurocognitive disorder due to Parkinson's disease 01/10/2022   Gastroesophageal reflux disease 03/07/2020   Localized swelling of right lower extremity 04/12/2019   DOE (dyspnea on exertion) 07/15/2018   10 year risk of MI or stroke 7.5% or greater 04/12/2018   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/02/2015   Meige syndrome (blepharospasm with oromandibular dystonia)  07/27/2014   Dysphagia, neurologic 07/27/2014   Parkinson's disease (HCC) 07/27/2014   Dystonia 07/10/2014   Low back pain 11/21/2011   Nonspecific elevation of levels of transaminase or lactic acid dehydrogenase (LDH) 07/06/2008   Enlarged lymph nodes 07/28/2007   Non-Hodgkin lymphoma 03/23/2007    ONSET DATE: Dx in mid-2010's; script dated 04/12/24  REFERRING DIAG:  G20.A1 (ICD-10-CM) - Parkinson's disease without dyskinesia or fluctuating manifestations (HCC)      THERAPY DIAG:  Dysarthria and anarthria  Rationale for Evaluation and Treatment: Rehabilitation  SUBJECTIVE:   SUBJECTIVE STATEMENT:  My voice is so soft today.  Pt accompanied by: self  PERTINENT HISTORY: Meige syndrome, insomnia, RBD. Pt well known to this SLP - previous ST course d/c'd in January 2025 with pt having met all LTGs.   PAIN:  Are you having pain? No  FALLS: Has patient fallen in last 6 months?  No, See PT evaluation for details  PATIENT GOALS: Improve with speech loudness   OBJECTIVE:  Note: Objective measures were completed at Evaluation unless otherwise noted.  DIAGNOSTIC FINDINGS:  From neuropsych evaluation report dated April 2023:  [name] [name] completed a comprehensive neuropsychological evaluation on 01/10/2022. Please refer to that encounter for the full report and recommendations. Briefly, results suggested isolated impaired performances across complex attention and semantic fluency. Performance variability was also exhibited across all aspects of learning and memory. Regarding etiology, the most likely culprit for  ongoing cognitive weakness remains his past history of Parkinson's disease. While processing speed was improved relative to what might be expected, dysfunction surrounding complex attention and encoding/retrieval deficits of memory are quite common in this illness. The fact that motor/gait dysfunction was noted years prior to concern surrounding cognitive decline is also a  typical timeline associated with Parkinson's disease. Mild anxiety and sleep dysfunction could further influence cognitive performances. Research surrounding cognitive side effects of pramipexole  is mixed; however, I cannot rule out an ongoing side effect contribution as well.   PATIENT REPORTED OUTCOME MEASURES (PROM): Communication Effectiveness Survey: pt scored himself 13/32, with lower scores indicating more challenges communicating given pt's deficits. 1 (not at all effective) was scored for a conversation in a noisy environment, speaking when emotionally upset/angry, a conversation in a car, and a conversation at a distance.Other responses were 2.  TODAY'S TREATMENT:                                                                                                                                         DATE:   07/26/24: Pt arrived with volume 67dB faded to 66dB after 2 minutes of conversation. SLP provided rare mod cues for improved volume but limited response from pt. Loud /a/ average 90dB, sentences averaged 84dB. In sentence/ responses pt maintained average 69dB. Pt shared he had not practiced much since last session due to computer issues of his wife which brought son and his family over to his house many of the last 4 days. SLP encouraged him to maintain frequency with HEP.   07/20/24: Pt arrived with volume 67dB in conversation. SLP with occasional mod cues for improved volume but limited response from pt. SLP guided pt through loud /a/ and everyday sentences and pt's conversational speech after this was WNL volume. SLP directed pt with sentence level responses with pt's volume average 70dB with occasional min A for improved volume and intent. After 7 mintues of task SLP req'd to provide usual min-mod A. SLP provided homework for pt to complete 15 minutes daily for incr'd loudness/intent with speech.  07/13/24: SLP collaborated with pt to assist him in real-time feedback for louder  speech in 60-second conversational segments. Pt req'd mod-max A usually for louder speech, average 66dB. Pt told SLP that if an emotional topic he will get softer consistently. After loud /a/ average 92dB, pt's loudness increased to 69dB over the next 3 mintues. After this loudness decreased again to average 67dB. SLP told pt he needed to practice each day and used this example to verify when pt practices his voice is louder. Everyday sentences were average 83dB.  07/11/24: SLP provided pt with PROM with instructions to fill out as if it were day after evaluation. Scored as above.  Loud /a/ average generated average 90dB. Pt produced everyday sentences with average 81dB. With sentence responses pt generated average 69dB with rare min A  for loudness and intent. In short conversational segments of 20-30 seconds, pt averaged 69dB with occasional mod A.   07/06/24: PT NEEDS PROM NEXT SESSION. Today with loud /a/ pt generated average 91dB. Pt produced everyday sentences with average 81 dB. With sentence responses pt generated average In short conversational segments of 20 seconds, pt averaged 67 dB with loudness fade noted. SLP asked pt to practice with loud /a/, everyday sentences, and sentence responses.  07/04/24: Pt needs PROM next session. Pt had re-eval today, as this is day 61 since evaluation. See objective voice assessment above, for changes. In general, pt's volume has slightly increased, but still below average. Today with loud /a/ pt generated average 89dB. SLP assisted pt in generating more applicable everyday sentences. Pt changed one of these. He produced these sentences with average 83 dB. In short conversational segments of 20 seconds, pt averaged 67 dB with loudness fade noted. SLP confirmed his practice regimen as 10 ah, 10 everyday sentences, and reading out loud for 3 minutes in 30 second intervals.   05/04/24: SLP worked with pt with iPad to incr awareness of lower than WNL voice. Pt  used granddad shouting voice to get more intentional speech with louder, WNL volume speech. SLP collaborated pt with reps of loud /a/ and his everyday sentences; SLP verified pt's everyday sentences would all still be pertinent/relevant - pt to change two (Alexa). SLP told him to pay close attention to things he says until next session and substitute another sentence into his list.  PATIENT EDUCATION: Education details: see treatment date Person educated: Patient Education method: Explanation, Demonstration, Verbal cues, and Handouts Education comprehension: verbalized understanding, returned demonstration, verbal cues required, and needs further education  HOME EXERCISE PROGRAM: Loud /a/ and everyday sentences, along with speech tasks matching ability level   GOALS: Goals reviewed with patient? Yes  SHORT TERM GOALS: Target date: 07/29/24 (first ST not until week of 07/04/24)  Pt will produce loud /a/ with at least low 90s dB average over three sessions  Baseline: Goal status: INITIAL  2.  Pt will produce 16/20 sentence responses with 70dB over two sessions  Baseline:  Goal status: INITIAL  3.  Pt will produce volume average 69dB in 3 minutes simple conversation with rare min A over three sessions  Baseline:  Goal status: INITIAL  4.  Pt will generate abdominal breathing 80% of the time when engaging in 3 minutes simple conversation in 2 sessions  Baseline:  Goal status: INITIAL   LONG TERM GOALS: Target date: , 08/26/24  Pt will maintain average 69dB over 8 minute simple-mod complex conversation with rare min A in three sessions  Baseline:  Goal status: INITIAL  2.  Pt will remain 100% intelligible out of the speech room for 8 minute conversation, with rare nonverbal cues Baseline:  Goal status: INITIAL  3.  pt will use abdominal breathing 70% of the time in 8 minutes conversation in three sessions  Baseline:  Goal status: INITIAL  4.  Pt will score higher on CES  than initial administration  Baseline:  Goal status: INITIAL   ASSESSMENT:  CLINICAL IMPRESSION: Patient is a 73 y.o. male who was seen today for treatment of speech clarity who demonstrated dysarthria most c/b low voice volume due to Parkinson's disease. See treatment date above for today's date for further details on today's session. When asked to produce speech with increased intent during day of eval, he improved volume in 1 minutes of simple conversation however volume  decline noted after approx 30 seconds. Pt would benefit from skilled ST targeting loudness in conversation. At this time he does not endorse swallowing difficulty but SLP will monitor informally throughout therapy course and goal/s added PRN. Pt continues to use memory compensations targeted in previous therapy courses.  OBJECTIVE IMPAIRMENTS: Objective impairments include attention, memory, and dysarthria. These impairments are limiting patient from ADLs/IADLs and effectively communicating at home and in community.Factors affecting potential to achieve goals and functional outcome are previous level of function and severity of impairments. Patient will benefit from skilled SLP services to address above impairments and improve overall function.  REHAB POTENTIAL: Good  PLAN:  SLP FREQUENCY: 2x/week  SLP DURATION: 8 weeks  PLANNED INTERVENTIONS: 92507 Treatment of speech (30 or 45 min) , Environmental controls, Cueing hierachy, Internal/external aids, Functional tasks, Multimodal communication approach, SLP instruction and feedback, Compensatory strategies, and Patient/family education    [facility], CCC-SLP 07/26/2024, 3:13 PM

## 2024-07-28 ENCOUNTER — Ambulatory Visit

## 2024-07-28 DIAGNOSIS — R471 Dysarthria and anarthria: Secondary | ICD-10-CM | POA: Diagnosis not present

## 2024-07-28 NOTE — Therapy (Signed)
 OUTPATIENT SPEECH LANGUAGE PATHOLOGY [facility]   Patient Name: [name] [name] MRN: [id number] DOB:[birth year], 73 y.o., male Today's Date: 07/28/2024  PCP: Frann Netter, MD REFERRING PROVIDER: Evonnie Stabs, DO  END OF SESSION:  End of Session - 07/28/24 1550     Visit Number 8    Number of Visits 17    Date for Recertification  08/26/24    SLP Start Time 1536    SLP Stop Time  1615    SLP Time Calculation (min) 39 min    Activity Tolerance Patient tolerated treatment well             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/27/2014   Mild neurocognitive disorder due to Parkinson's disease 01/10/2022   Non-Hodgkin lymphoma 2007   diffuse- 6 cycles of chemo with R CHOP Rituxan - last dose 10/08   Nonspecific elevation of levels of transaminase or lactic acid dehydrogenase (LDH) 07/06/2008   Parkinson's disease 07/27/2014   Post-splenectomy 04/02/2015   Past Surgical History:  Procedure Laterality Date   CHOLECYSTECTOMY, LAPAROSCOPIC     EYE SURGERY     x2 as child   PORT-A-CATH REMOVAL     PORTACATH PLACEMENT     SPLENECTOMY     TONSILLECTOMY     Patient Active Problem List   Diagnosis Date Noted   Mild neurocognitive disorder due to Parkinson's disease 01/10/2022   Gastroesophageal reflux disease 03/07/2020   Localized swelling of right lower extremity 04/12/2019   DOE (dyspnea on exertion) 07/15/2018   10 year risk of MI or stroke 7.5% or greater 04/12/2018   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/2015   Meige syndrome (blepharospasm with oromandibular dystonia)  07/27/2014   Dysphagia, neurologic 07/27/2014   Parkinson's disease (HCC) 07/27/2014   Dystonia 07/10/2014   Low back pain 11/21/2011   Nonspecific elevation of levels of transaminase or lactic acid dehydrogenase (LDH) 07/06/2008   Enlarged lymph nodes 07/28/2007   Non-Hodgkin lymphoma 03/23/2007    ONSET DATE: Dx in mid-2010's; script dated 04/12/24  REFERRING DIAG:  G20.A1 (ICD-10-CM) - Parkinson's disease without dyskinesia or fluctuating manifestations (HCC)      THERAPY DIAG:  Dysarthria and anarthria  Rationale for Evaluation and Treatment: Rehabilitation  SUBJECTIVE:   SUBJECTIVE STATEMENT:  Pt took meds at 1545 today  Pt accompanied by: self  PERTINENT HISTORY: Meige syndrome, insomnia, RBD. Pt well known to this SLP - previous ST course d/c'd in January 2025 with pt having met all LTGs.   PAIN:  Are you having pain? No  FALLS: Has patient fallen in last 6 months?  No, See PT evaluation for details  PATIENT GOALS: Improve with speech loudness   OBJECTIVE:  Note: Objective measures were completed at Evaluation unless otherwise noted.  DIAGNOSTIC FINDINGS:  From neuropsych evaluation report dated April 2023:  Mr. Miler completed a comprehensive neuropsychological evaluation on 01/10/2022. Please refer to that encounter for the full report and recommendations. Briefly, results suggested isolated impaired performances across complex attention and semantic fluency. Performance variability was also exhibited across all aspects of learning and memory. Regarding etiology, the most likely culprit for  ongoing cognitive weakness remains his past history of Parkinson's disease. While processing speed was improved relative to what might be expected, dysfunction surrounding complex attention and encoding/retrieval deficits of memory are quite common in this illness. The fact that motor/gait dysfunction was noted years prior to concern surrounding cognitive decline is also a  typical timeline associated with Parkinson's disease. Mild anxiety and sleep dysfunction could further influence cognitive performances. Research surrounding cognitive side effects of pramipexole  is mixed; however, I cannot rule out an ongoing side effect contribution as well.   PATIENT REPORTED OUTCOME MEASURES (PROM): Communication Effectiveness Survey: pt scored himself 13/32, with lower scores indicating more challenges communicating given pt's deficits. 1 (not at all effective) was scored for a conversation in a noisy environment, speaking when emotionally upset/angry, a conversation in a car, and a conversation at a distance.Other responses were 2.  TODAY'S TREATMENT:                                                                                                                                         DATE:   07/28/24: Pt practiced loud /a/ and everyday sentences x3 since last session - the expected amount.  Loud /a/ average 91dB, everyday sentences averaged 83dB. In sentence responses pt produced responses with average volume 67dB but improved to 68dB average with SLP request for repeat. Pt had difficulty today with trailing off from beginning to sentence to end of sentence. In conversation, limited/no carryover to conversational speech, even after practicing for the session.   07/26/24: Pt arrived with volume 67dB faded to 66dB after 2 minutes of conversation. SLP provided rare mod cues for improved volume but limited response from pt. Loud /a/ average 90dB, sentences averaged 84dB. In sentence/ responses pt maintained average 69dB - and stated S above.Pt shared he had not practiced much since last session due to computer issues of his wife which brought son and his family over to his house many of the last 4 days. SLP encouraged him to maintain frequency with HEP.   07/20/24: Pt arrived with volume 67dB in conversation. SLP with occasional mod cues for improved volume but limited  response from pt. SLP guided pt through loud /a/ and everyday sentences and pt's conversational speech after this was WNL volume. SLP directed pt with sentence level responses with pt's volume average 70dB with occasional min A for improved volume and intent. After 7 mintues of task SLP req'd to provide usual min-mod A. SLP provided homework for pt to complete 15 minutes daily for incr'd loudness/intent with speech.  07/13/24: SLP collaborated with pt to assist him in real-time feedback for louder speech in 60-second conversational segments. Pt req'd mod-max A usually for louder speech, average 66dB. Pt told SLP that if an emotional topic he will get softer consistently. After loud /a/ average 92dB, pt's loudness increased to 69dB over the next 3 mintues. After this loudness  decreased again to average 67dB. SLP told pt he needed to practice each day and used this example to verify when pt practices his voice is louder. Everyday sentences were average 83dB.  07/11/24: SLP provided pt with PROM with instructions to fill out as if it were day after evaluation. Scored as above.  Loud /a/ average generated average 90dB. Pt produced everyday sentences with average 81dB. With sentence responses pt generated average 69dB with rare min A for loudness and intent. In short conversational segments of 20-30 seconds, pt averaged 69dB with occasional mod A.   07/06/24: PT NEEDS PROM NEXT SESSION. Today with loud /a/ pt generated average 91dB. Pt produced everyday sentences with average 81 dB. With sentence responses pt generated average In short conversational segments of 20 seconds, pt averaged 67 dB with loudness fade noted. SLP asked pt to practice with loud /a/, everyday sentences, and sentence responses.  07/04/24: Pt needs PROM next session. Pt had re-eval today, as this is day 61 since evaluation. See objective voice assessment above, for changes. In general, pt's volume has slightly increased, but still below  average. Today with loud /a/ pt generated average 89dB. SLP assisted pt in generating more applicable everyday sentences. Pt changed one of these. He produced these sentences with average 83 dB. In short conversational segments of 20 seconds, pt averaged 67 dB with loudness fade noted. SLP confirmed his practice regimen as 10 ah, 10 everyday sentences, and reading out loud for 3 minutes in 30 second intervals.   05/04/24: SLP worked with pt with iPad to incr awareness of lower than WNL voice. Pt used granddad shouting voice to get more intentional speech with louder, WNL volume speech. SLP collaborated pt with reps of loud /a/ and his everyday sentences; SLP verified pt's everyday sentences would all still be pertinent/relevant - pt to change two (Alexa). SLP told him to pay close attention to things he says until next session and substitute another sentence into his list.  PATIENT EDUCATION: Education details: see treatment date Person educated: Patient Education method: Explanation, Demonstration, Verbal cues, and Handouts Education comprehension: verbalized understanding, returned demonstration, verbal cues required, and needs further education  HOME EXERCISE PROGRAM: Loud /a/ and everyday sentences, along with speech tasks matching ability level   GOALS: Goals reviewed with patient? Yes  SHORT TERM GOALS: Target date: 07/29/24 (first ST not until week of 07/04/24)  Pt will produce loud /a/ with at least low 90s dB average over three sessions  Baseline: Goal status: met  2.  Pt will produce 16/20 sentence responses with 70dB over two sessions  Baseline:  Goal status: not met  3.  Pt will produce volume average 69dB in 3 minutes simple conversation with rare min A over three sessions  Baseline:  Goal status: not met  4.  Pt will generate abdominal breathing 80% of the time when engaging in 3 minutes simple conversation in 2 sessions  Baseline:  Goal status: deferred - not  targeted   LONG TERM GOALS: Target date: , 08/26/24  Pt will maintain average 69dB over 8 minute simple-mod complex conversation with rare min A in three sessions  Baseline:  Goal status: INITIAL  2.  Pt will remain 100% intelligible out of the speech room for 8 minute conversation, with rare nonverbal cues Baseline:  Goal status: INITIAL  3.  pt will use abdominal breathing 70% of the time in 8 minutes conversation in three sessions  Baseline:  Goal status: INITIAL  4.  Pt will score higher on CES than initial administration  Baseline:  Goal status: INITIAL   ASSESSMENT:  CLINICAL IMPRESSION: Patient is a 73 y.o. male who was seen today for treatment of speech clarity who demonstrated dysarthria most c/b low voice volume due to Parkinson's disease. See treatment date above for today's date for further details on today's session. When asked to produce speech with increased intent during day of eval, he improved volume in 1 minutes of simple conversation however volume decline noted after approx 30 seconds. Pt would benefit from skilled ST targeting loudness in conversation. At this time he does not endorse swallowing difficulty but SLP will monitor informally throughout therapy course and goal/s added PRN. Pt continues to use memory compensations targeted in previous therapy courses.  OBJECTIVE IMPAIRMENTS: Objective impairments include attention, memory, and dysarthria. These impairments are limiting patient from ADLs/IADLs and effectively communicating at home and in community.Factors affecting potential to achieve goals and functional outcome are previous level of function and severity of impairments. Patient will benefit from skilled SLP services to address above impairments and improve overall function.  REHAB POTENTIAL: Good  PLAN:  SLP FREQUENCY: 2x/week  SLP DURATION: 8 weeks  PLANNED INTERVENTIONS: 92507 Treatment of speech (30 or 45 min) , Environmental controls,  Cueing hierachy, Internal/external aids, Functional tasks, Multimodal communication approach, SLP instruction and feedback, Compensatory strategies, and Patient/family education    [facility], CCC-SLP 07/28/2024, 3:54 PM

## 2024-08-02 ENCOUNTER — Ambulatory Visit

## 2024-08-02 DIAGNOSIS — R471 Dysarthria and anarthria: Secondary | ICD-10-CM

## 2024-08-02 NOTE — Therapy (Signed)
 OUTPATIENT SPEECH LANGUAGE PATHOLOGY PARKINSON'S TREATMENT   Patient Name: [name] [name] MRN: [id number] DOB:1951/02/18, 73 y.o., male Today's Date: 08/02/2024  PCP: Frann Netter, MD REFERRING PROVIDER: Evonnie Stabs, DO  END OF SESSION:  End of Session - 08/02/24 1545     Visit Number 9    Number of Visits 17    Date for Recertification  08/26/24    SLP Start Time 1537   checked in 1535   SLP Stop Time  1615    SLP Time Calculation (min) 38 min    Activity Tolerance Patient tolerated treatment well              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   Mild neurocognitive disorder due to Parkinson's disease 01/10/2022   Non-Hodgkin lymphoma 2007   diffuse- 6 cycles of chemo with R [facility] Rituxan - last dose 10/08   Nonspecific elevation of levels of transaminase or lactic acid dehydrogenase (LDH) 07/06/2008   Parkinson's disease 07/27/2014   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/02/2015   Past Surgical History:  Procedure Laterality Date   CHOLECYSTECTOMY, LAPAROSCOPIC     EYE SURGERY     x2 as child   PORT-A-CATH REMOVAL     PORTACATH PLACEMENT     SPLENECTOMY     TONSILLECTOMY     Patient Active Problem List   Diagnosis Date Noted   Mild neurocognitive disorder due to Parkinson's disease 01/10/2022   Gastroesophageal reflux disease 03/07/2020   Localized swelling of right lower extremity 04/12/2019   DOE (dyspnea on exertion) 07/15/2018   10 year risk of MI or stroke 7.5% or greater 04/12/2018   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/02/2015   Meige syndrome (blepharospasm with  oromandibular dystonia) 07/27/2014   Dysphagia, neurologic 07/27/2014   Parkinson's disease (HCC) 07/27/2014   Dystonia 07/10/2014   Low back pain 11/21/2011   Nonspecific elevation of levels of transaminase or lactic acid dehydrogenase (LDH) 07/06/2008   Enlarged lymph nodes 07/28/2007   Non-Hodgkin lymphoma 03/23/2007    ONSET DATE: Dx in mid-2010's; script dated 04/12/24  REFERRING DIAG:  G20.A1 (ICD-10-CM) - Parkinson's disease without dyskinesia or fluctuating manifestations (HCC)      THERAPY DIAG:  Dysarthria and anarthria  Rationale for Evaluation and Treatment: Rehabilitation  SUBJECTIVE:   SUBJECTIVE STATEMENT:  Pt has three examples of situations where he knew he was using louder speech. (See today's treatment)   Pt accompanied by: self  PERTINENT HISTORY: Meige syndrome, insomnia, RBD. Pt well known to this SLP - previous ST course d/c'd in January 2025 with pt having met all LTGs.   PAIN:  Are you having pain? No  FALLS: Has patient fallen in last 6 months?  No, See PT evaluation for details  PATIENT GOALS: Improve with speech loudness   OBJECTIVE:  Note: Objective measures were completed at Evaluation unless otherwise noted.  DIAGNOSTIC FINDINGS:  From neuropsych evaluation report dated April 2023:  Mr. Mcbroom completed a comprehensive neuropsychological evaluation on 01/10/2022. Please refer to that encounter for the full report and recommendations. Briefly, results suggested isolated impaired performances across complex attention and semantic fluency. Performance variability  was also exhibited across all aspects of learning and memory. Regarding etiology, the most likely culprit for ongoing cognitive weakness remains his past history of Parkinson's disease. While processing speed was improved relative to what might be expected, dysfunction surrounding complex attention and encoding/retrieval deficits of memory are quite common in this illness. The fact that  motor/gait dysfunction was noted years prior to concern surrounding cognitive decline is also a typical timeline associated with Parkinson's disease. Mild anxiety and sleep dysfunction could further influence cognitive performances. Research surrounding cognitive side effects of pramipexole  is mixed; however, I cannot rule out an ongoing side effect contribution as well.   PATIENT REPORTED OUTCOME MEASURES (PROM): Communication Effectiveness Survey: pt scored himself 13/32, with lower scores indicating more challenges communicating given pt's deficits. 1 (not at all effective) was scored for a conversation in a noisy environment, speaking when emotionally upset/angry, a conversation in a car, and a conversation at a distance.Other responses were 2.  TODAY'S TREATMENT:                                                                                                                                         DATE:   08/02/24: Pt loud /a/ and sentences practiced each day since last session. Pt has seen three examples of louder speech with his son, during Sunday school, and the [employer].  Loud /a/ average 93dB, everyday sentences averaged 85dB. In sentence responses pt produced responses with average volume 67dB but improved to 68dB average with SLP request for repeat. SLP assisted pt with sentence responses with louder speech - average 69dB. SLP continued practice in conversation, with pt's average 68dB for the first 5 minutes and after this req'd mod cues for loudness, occasionally. SLP suspects this due to fatigue.    07/28/24: Pt practiced loud /a/ and everyday sentences x3 since last session - the expected amount.  Loud /a/ average 91dB, everyday sentences averaged 83dB. In sentence responses pt produced responses with average volume 67dB but improved to 68dB average with SLP request for repeat. Pt had difficulty today with trailing off from beginning to sentence to end of sentence. In  conversation, limited/no carryover to conversational speech, even after practicing for the session.   07/26/24: Pt arrived with volume 67dB faded to 66dB after 2 minutes of conversation. SLP provided rare mod cues for improved volume but limited response from pt. Loud /a/ average 90dB, sentences averaged 84dB. In sentence/ responses pt maintained average 69dB - and stated S above.Pt shared he had not practiced much since last session due to computer issues of his wife which brought son and his family over to his house many of the last 4 days. SLP encouraged him to maintain frequency with HEP.   07/20/24: Pt arrived with volume 67dB in conversation. SLP with occasional mod cues for improved volume but limited response from pt. SLP guided pt through loud /a/  and everyday sentences and pt's conversational speech after this was WNL volume. SLP directed pt with sentence level responses with pt's volume average 70dB with occasional min A for improved volume and intent. After 7 mintues of task SLP req'd to provide usual min-mod A. SLP provided homework for pt to complete 15 minutes daily for incr'd loudness/intent with speech.  07/13/24: SLP collaborated with pt to assist him in real-time feedback for louder speech in 60-second conversational segments. Pt req'd mod-max A usually for louder speech, average 66dB. Pt told SLP that if an emotional topic he will get softer consistently. After loud /a/ average 92dB, pt's loudness increased to 69dB over the next 3 mintues. After this loudness decreased again to average 67dB. SLP told pt he needed to practice each day and used this example to verify when pt practices his voice is louder. Everyday sentences were average 83dB.  07/11/24: SLP provided pt with PROM with instructions to fill out as if it were day after evaluation. Scored as above.  Loud /a/ average generated average 90dB. Pt produced everyday sentences with average 81dB. With sentence responses pt generated  average 69dB with rare min A for loudness and intent. In short conversational segments of 20-30 seconds, pt averaged 69dB with occasional mod A.   07/06/24: PT NEEDS PROM NEXT SESSION. Today with loud /a/ pt generated average 91dB. Pt produced everyday sentences with average 81 dB. With sentence responses pt generated average In short conversational segments of 20 seconds, pt averaged 67 dB with loudness fade noted. SLP asked pt to practice with loud /a/, everyday sentences, and sentence responses.  07/04/24: Pt needs PROM next session. Pt had re-eval today, as this is day 61 since evaluation. See objective voice assessment above, for changes. In general, pt's volume has slightly increased, but still below average. Today with loud /a/ pt generated average 89dB. SLP assisted pt in generating more applicable everyday sentences. Pt changed one of these. He produced these sentences with average 83 dB. In short conversational segments of 20 seconds, pt averaged 67 dB with loudness fade noted. SLP confirmed his practice regimen as 10 ah, 10 everyday sentences, and reading out loud for 3 minutes in 30 second intervals.   05/04/24: SLP worked with pt with iPad to incr awareness of lower than WNL voice. Pt used granddad shouting voice to get more intentional speech with louder, WNL volume speech. SLP collaborated pt with reps of loud /a/ and his everyday sentences; SLP verified pt's everyday sentences would all still be pertinent/relevant - pt to change two (Alexa). SLP told him to pay close attention to things he says until next session and substitute another sentence into his list.  PATIENT EDUCATION: Education details: see treatment date Person educated: Patient Education method: Explanation, Demonstration, Verbal cues, and Handouts Education comprehension: verbalized understanding, returned demonstration, verbal cues required, and needs further education  HOME EXERCISE PROGRAM: Loud /a/ and  everyday sentences, along with speech tasks matching ability level   GOALS: Goals reviewed with patient? Yes  SHORT TERM GOALS: Target date: 07/29/24 (first ST not until week of 07/04/24)  Pt will produce loud /a/ with at least low 90s dB average over three sessions  Baseline: Goal status: met  2.  Pt will produce 16/20 sentence responses with 70dB over two sessions  Baseline:  Goal status: not met  3.  Pt will produce volume average 69dB in 3 minutes simple conversation with rare min A over three sessions  Baseline:  Goal status: not  met  4.  Pt will generate abdominal breathing 80% of the time when engaging in 3 minutes simple conversation in 2 sessions  Baseline:  Goal status: deferred - not targeted   LONG TERM GOALS: Target date: , 08/26/24  Pt will maintain average 69dB over 8 minute simple-mod complex conversation with rare min A in three sessions  Baseline:  Goal status: INITIAL  2.  Pt will remain 100% intelligible out of the speech room for 8 minute conversation, with rare nonverbal cues Baseline:  Goal status: INITIAL  3.  pt will use abdominal breathing 70% of the time in 8 minutes conversation in three sessions  Baseline:  Goal status: INITIAL  4.  Pt will score higher on CES than initial administration  Baseline:  Goal status: INITIAL   ASSESSMENT:  CLINICAL IMPRESSION: Patient is a 73 y.o. male who was seen today for treatment of speech clarity who demonstrated dysarthria most c/b low voice volume due to Parkinson's disease. See treatment date above for today's date for further details on today's session. When asked to produce speech with increased intent during day of eval, he improved volume in 1 minutes of simple conversation however volume decline noted after approx 30 seconds. Pt would benefit from skilled ST targeting loudness in conversation. At this time he does not endorse swallowing difficulty but SLP will monitor informally throughout therapy  course and goal/s added PRN. Pt continues to use memory compensations targeted in previous therapy courses.  OBJECTIVE IMPAIRMENTS: Objective impairments include attention, memory, and dysarthria. These impairments are limiting patient from ADLs/IADLs and effectively communicating at home and in community.Factors affecting potential to achieve goals and functional outcome are previous level of function and severity of impairments. Patient will benefit from skilled SLP services to address above impairments and improve overall function.  REHAB POTENTIAL: Good  PLAN:  SLP FREQUENCY: 2x/week  SLP DURATION: 8 weeks  PLANNED INTERVENTIONS: 92507 Treatment of speech (30 or 45 min) , Environmental controls, Cueing hierachy, Internal/external aids, Functional tasks, Multimodal communication approach, SLP instruction and feedback, Compensatory strategies, and Patient/family education    [employer], CCC-SLP 08/02/2024, 3:45 PM

## 2024-08-04 ENCOUNTER — Ambulatory Visit

## 2024-08-04 DIAGNOSIS — R471 Dysarthria and anarthria: Secondary | ICD-10-CM

## 2024-08-04 NOTE — Therapy (Signed)
 OUTPATIENT SPEECH LANGUAGE PATHOLOGY PARKINSON'S TREATMENT   Patient Name: [name] [name] MRN: [id number] DOB:1951/03/08, 73 y.o., male Today's Date: 08/04/2024  PCP: Frann Netter, MD REFERRING PROVIDER: Evonnie Stabs, DO  END OF SESSION:        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   Mild neurocognitive disorder due to Parkinson's disease 01/10/2022   Non-Hodgkin lymphoma 2007   diffuse- 6 cycles of chemo with R [facility] Rituxan - last dose 10/08   Nonspecific elevation of levels of transaminase or lactic acid dehydrogenase (LDH) 07/06/2008   Parkinson's disease 07/27/2014   Post-splenectomy 04/02/2015   Past Surgical History:  Procedure Laterality Date   CHOLECYSTECTOMY, LAPAROSCOPIC     EYE SURGERY     x2 as child   PORT-A-CATH REMOVAL     PORTACATH PLACEMENT     SPLENECTOMY     TONSILLECTOMY     Patient Active Problem List   Diagnosis Date Noted   Mild neurocognitive disorder due to Parkinson's disease 01/10/2022   Gastroesophageal reflux disease 03/07/2020   Localized swelling of right lower extremity 04/12/2019   DOE (dyspnea on exertion) 07/15/2018   10 year risk of MI or stroke 7.5% or greater 04/12/2018   Post-splenectomy 04/02/2015   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  Objective: See below. Pt has noticed his wife has not asked him to repeat as much as she was prior to ST.  Goal Update: See below.  Plan: See pt for 6-8 more sessions  Reason Skilled Services are Required: Pt has not yet maxed rehab potential.    ONSET DATE: Dx in mid-2010's; script dated 04/12/24  REFERRING DIAG:  G20.A1 (ICD-10-CM) - Parkinson's disease without dyskinesia or fluctuating manifestations (HCC)      THERAPY DIAG:  Dysarthria and anarthria  Rationale for Evaluation and Treatment: Rehabilitation  SUBJECTIVE:   SUBJECTIVE STATEMENT:  Pt has three examples of situations where he knew he was using louder speech. (See today's treatment)   Pt accompanied by: self  PERTINENT HISTORY: Meige syndrome, insomnia, RBD. Pt well known to this SLP - previous ST course d/c'd in January 2025 with pt having met all LTGs.   PAIN:  Are you having pain? No  FALLS: Has patient fallen in last 6 months?  No, See PT evaluation for details  PATIENT GOALS: Improve with speech loudness   OBJECTIVE:  Note: Objective measures were completed at Evaluation unless otherwise noted.  DIAGNOSTIC FINDINGS:  From neuropsych evaluation report dated April 2023:  Mr. Minnie completed a comprehensive neuropsychological evaluation on 01/10/2022. Please refer to that encounter for the full report and recommendations. Briefly, results suggested isolated impaired performances across complex attention  and semantic fluency. Performance variability was also exhibited across all aspects of learning and memory. Regarding etiology, the most likely culprit for ongoing cognitive weakness remains his past history of Parkinson's disease. While processing speed was improved relative to what might be  expected, dysfunction surrounding complex attention and encoding/retrieval deficits of memory are quite common in this illness. The fact that motor/gait dysfunction was noted years prior to concern surrounding cognitive decline is also a typical timeline associated with Parkinson's disease. Mild anxiety and sleep dysfunction could further influence cognitive performances. Research surrounding cognitive side effects of pramipexole  is mixed; however, I cannot rule out an ongoing side effect contribution as well.   PATIENT REPORTED OUTCOME MEASURES (PROM): Communication Effectiveness Survey: pt scored himself 13/32, with lower scores indicating more challenges communicating given pt's deficits. 1 (not at all effective) was scored for a conversation in a noisy environment, speaking when emotionally upset/angry, a conversation in a car, and a conversation at a distance.Other responses were 2.  TODAY'S TREATMENT:                                                                                                                                         DATE:   08/04/24: Vickie isn't asking me to repeat as frequently. Pt practiced /a/ and sentences BID since last session. Loud /a/ average 92dB, everyday sentences averaged 83dB. In sentence responses pt produced responses with average volume 67dB but improved to 68dB average with SLP request for repeat. In sentence level responses pt maintained WNL volume 85% with some vocal tightness evident. In simple conversation he used WNL volume 70% of the time and when SLP cued him to speak out and use your best voice pt improved to 85% of the time.  08/02/24: Pt loud /a/ and sentences practiced each day since last session. Pt has seen three examples of louder speech with his son, during Sunday school, and the [employer] [facility].  Loud /a/ average 93dB, everyday sentences averaged 85dB. In sentence responses pt produced responses with average volume 67dB but improved  to 68dB average with SLP request for repeat. SLP assisted pt with sentence responses with louder speech - average 69dB. SLP continued practice in conversation, with pt's average 68dB for the first 5 minutes and after this req'd mod cues for loudness, occasionally. SLP suspects this due to fatigue.    07/28/24: Pt practiced loud /a/ and everyday sentences x3 since last session - the expected amount.  Loud /a/ average 91dB, everyday sentences averaged 83dB. In sentence responses pt produced responses with average volume 67dB but improved to 68dB average with SLP request for repeat. Pt had difficulty today with trailing off from beginning to sentence to end of sentence. In conversation, limited/no carryover to conversational speech, even after practicing for the session.   07/26/24: Pt arrived with volume 67dB faded to 66dB after 2 minutes of conversation. SLP provided rare mod  cues for improved volume but limited response from pt. Loud /a/ average 90dB, sentences averaged 84dB. In sentence/ responses pt maintained average 69dB - and stated S above.Pt shared he had not practiced much since last session due to computer issues of his wife which brought son and his family over to his house many of the last 4 days. SLP encouraged him to maintain frequency with HEP.   07/20/24: Pt arrived with volume 67dB in conversation. SLP with occasional mod cues for improved volume but limited response from pt. SLP guided pt through loud /a/ and everyday sentences and pt's conversational speech after this was WNL volume. SLP directed pt with sentence level responses with pt's volume average 70dB with occasional min A for improved volume and intent. After 7 mintues of task SLP req'd to provide usual min-mod A. SLP provided homework for pt to complete 15 minutes daily for incr'd loudness/intent with speech.  07/13/24: SLP collaborated with pt to assist him in real-time feedback for louder speech in 60-second conversational  segments. Pt req'd mod-max A usually for louder speech, average 66dB. Pt told SLP that if an emotional topic he will get softer consistently. After loud /a/ average 92dB, pt's loudness increased to 69dB over the next 3 mintues. After this loudness decreased again to average 67dB. SLP told pt he needed to practice each day and used this example to verify when pt practices his voice is louder. Everyday sentences were average 83dB.  07/11/24: SLP provided pt with PROM with instructions to fill out as if it were day after evaluation. Scored as above.  Loud /a/ average generated average 90dB. Pt produced everyday sentences with average 81dB. With sentence responses pt generated average 69dB with rare min A for loudness and intent. In short conversational segments of 20-30 seconds, pt averaged 69dB with occasional mod A.   07/06/24: PT NEEDS PROM NEXT SESSION. Today with loud /a/ pt generated average 91dB. Pt produced everyday sentences with average 81 dB. With sentence responses pt generated average In short conversational segments of 20 seconds, pt averaged 67 dB with loudness fade noted. SLP asked pt to practice with loud /a/, everyday sentences, and sentence responses.  07/04/24: Pt needs PROM next session. Pt had re-eval today, as this is day 61 since evaluation. See objective voice assessment above, for changes. In general, pt's volume has slightly increased, but still below average. Today with loud /a/ pt generated average 89dB. SLP assisted pt in generating more applicable everyday sentences. Pt changed one of these. He produced these sentences with average 83 dB. In short conversational segments of 20 seconds, pt averaged 67 dB with loudness fade noted. SLP confirmed his practice regimen as 10 ah, 10 everyday sentences, and reading out loud for 3 minutes in 30 second intervals.   05/04/24: SLP worked with pt with iPad to incr awareness of lower than WNL voice. Pt used granddad shouting voice to get  more intentional speech with louder, WNL volume speech. SLP collaborated pt with reps of loud /a/ and his everyday sentences; SLP verified pt's everyday sentences would all still be pertinent/relevant - pt to change two (Alexa). SLP told him to pay close attention to things he says until next session and substitute another sentence into his list.  PATIENT EDUCATION: Education details: see treatment date Person educated: Patient Education method: Explanation, Demonstration, Verbal cues, and Handouts Education comprehension: verbalized understanding, returned demonstration, verbal cues required, and needs further education  HOME EXERCISE PROGRAM: Loud /a/ and everyday sentences, along  with speech tasks matching ability level   GOALS: Goals reviewed with patient? Yes  SHORT TERM GOALS: Target date: 07/29/24 (first ST not until week of 07/04/24)  Pt will produce loud /a/ with at least low 90s dB average over three sessions  Baseline: Goal status: met  2.  Pt will produce 16/20 sentence responses with 70dB over two sessions  Baseline:  Goal status: not met  3.  Pt will produce volume average 69dB in 3 minutes simple conversation with rare min A over three sessions  Baseline:  Goal status: not met  4.  Pt will generate abdominal breathing 80% of the time when engaging in 3 minutes simple conversation in 2 sessions  Baseline:  Goal status: deferred - not targeted   LONG TERM GOALS: Target date: , 08/26/24  Pt will maintain average 69dB over 8 minute simple-mod complex conversation with rare min A in three sessions  Baseline:  Goal status: INITIAL  2.  Pt will remain 100% intelligible out of the speech room for 8 minute conversation, with rare nonverbal cues Baseline:  Goal status: INITIAL  3.  pt will use abdominal breathing 70% of the time in 8 minutes conversation in three sessions  Baseline:  Goal status: INITIAL  4.  Pt will score higher on CES than initial administration   Baseline:  Goal status: INITIAL   ASSESSMENT:  CLINICAL IMPRESSION: Patient is a 73 y.o. male who was seen today for treatment of speech clarity who demonstrated dysarthria most c/b low voice volume due to Parkinson's disease. See treatment date above for today's date for further details on today's session. When asked to produce speech with increased intent during day of eval, he improved volume in 1 minutes of simple conversation however volume decline noted after approx 30 seconds. Pt would benefit from skilled ST targeting loudness in conversation. At this time he does not endorse swallowing difficulty but SLP will monitor informally throughout therapy course and goal/s added PRN. Pt continues to use memory compensations targeted in previous therapy courses.  OBJECTIVE IMPAIRMENTS: Objective impairments include attention, memory, and dysarthria. These impairments are limiting patient from ADLs/IADLs and effectively communicating at home and in community.Factors affecting potential to achieve goals and functional outcome are previous level of function and severity of impairments. Patient will benefit from skilled SLP services to address above impairments and improve overall function.  REHAB POTENTIAL: Good  PLAN:  SLP FREQUENCY: 2x/week  SLP DURATION: 8 weeks  PLANNED INTERVENTIONS: 92507 Treatment of speech (30 or 45 min) , Environmental controls, Cueing hierachy, Internal/external aids, Functional tasks, Multimodal communication approach, SLP instruction and feedback, Compensatory strategies, and Patient/family education    [facility], CCC-SLP 08/04/2024, 3:41 PM

## 2024-08-11 ENCOUNTER — Ambulatory Visit

## 2024-08-11 DIAGNOSIS — R471 Dysarthria and anarthria: Secondary | ICD-10-CM | POA: Diagnosis not present

## 2024-08-11 NOTE — Therapy (Signed)
 OUTPATIENT SPEECH LANGUAGE PATHOLOGY PARKINSON'S TREATMENT   Patient Name: [name] [name] MRN: [id number] DOB:[birth year], 73 y.o., male Today's Date: 08/11/2024  PCP: Frann Netter, MD REFERRING PROVIDER: Evonnie Stabs, DO  END OF SESSION:  End of Session - 08/11/24 1545     Visit Number 11    Number of Visits 17    Date for Recertification  08/26/24    SLP Start Time 1458    SLP Stop Time  1538    SLP Time Calculation (min) 40 min    Activity Tolerance Patient tolerated treatment well               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   Mild neurocognitive disorder due to Parkinson's disease 01/10/2022   Non-Hodgkin lymphoma 2007   diffuse- 6 cycles of chemo with R [facility]OP Rituxan - last dose 10/08   Nonspecific elevation of levels of transaminase or lactic acid dehydrogenase (LDH) 07/06/2008   Parkinson's disease 07/27/2014   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   Past Surgical History:  Procedure Laterality Date   CHOLECYSTECTOMY, LAPAROSCOPIC     EYE SURGERY     x2 as child   PORT-A-CATH REMOVAL     PORTACATH PLACEMENT     SPLENECTOMY     TONSILLECTOMY     Patient Active Problem List   Diagnosis Date Noted   Mild neurocognitive disorder due to Parkinson's disease 01/10/2022   Gastroesophageal reflux disease 03/07/2020   Localized swelling of right lower extremity 04/12/2019   DOE (dyspnea on exertion) 07/15/2018   10 year risk of MI or stroke 7.5% or greater 04/12/2018   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/02/2015   Meige syndrome (blepharospasm with oromandibular  dystonia) 07/27/2014   Dysphagia, neurologic 07/27/2014   Parkinson's disease (HCC) 07/27/2014   Dystonia 07/10/2014   Low back pain 11/21/2011   Nonspecific elevation of levels of transaminase or lactic acid dehydrogenase (LDH) 07/06/2008   Enlarged lymph nodes 07/28/2007   Non-Hodgkin lymphoma 03/23/2007    ONSET DATE: Dx in mid-2010's; script dated 04/12/24  REFERRING DIAG:  G20.A1 (ICD-10-CM) - Parkinson's disease without dyskinesia or fluctuating manifestations (HCC)      THERAPY DIAG:  Dysarthria and anarthria  Rationale for Evaluation and Treatment: Rehabilitation  SUBJECTIVE:   SUBJECTIVE STATEMENT:  Pt has three examples of situations where he knew he was using louder speech. (See today's treatment)   Pt accompanied by: self  PERTINENT HISTORY: Meige syndrome, insomnia, RBD. Pt well known to this SLP - previous ST course d/c'd in January 2025 with pt having met all LTGs.   PAIN:  Are you having pain? No  FALLS: Has patient fallen in last 6 months?  No, See PT evaluation for details  PATIENT GOALS: Improve with speech loudness   OBJECTIVE:  Note: Objective measures were completed at Evaluation unless otherwise noted.  DIAGNOSTIC FINDINGS:  From neuropsych evaluation report dated April 2023:  Mr. Essick completed a comprehensive neuropsychological evaluation on 01/10/2022. Please refer to that encounter for the full report and recommendations. Briefly, results suggested isolated impaired performances across complex attention and semantic fluency. Performance variability was also exhibited  across all aspects of learning and memory. Regarding etiology, the most likely culprit for ongoing cognitive weakness remains his past history of Parkinson's disease. While processing speed was improved relative to what might be expected, dysfunction surrounding complex attention and encoding/retrieval deficits of memory are quite common in this illness. The fact that motor/gait  dysfunction was noted years prior to concern surrounding cognitive decline is also a typical timeline associated with Parkinson's disease. Mild anxiety and sleep dysfunction could further influence cognitive performances. Research surrounding cognitive side effects of pramipexole  is mixed; however, I cannot rule out an ongoing side effect contribution as well.   PATIENT REPORTED OUTCOME MEASURES (PROM): Communication Effectiveness Survey: pt scored himself 13/32, with lower scores indicating more challenges communicating given pt's deficits. 1 (not at all effective) was scored for a conversation in a noisy environment, speaking when emotionally upset/angry, a conversation in a car, and a conversation at a distance.Other responses were 2.  TODAY'S TREATMENT:                                                                                                                                         DATE:   08/11/24:  Targeted volume and intelligibility using Speak Out! Lesson 9. Pt required occasional mod verbal cues, modeling, for volume and breath support.   Pt averages the following volume levels:  Sustained AH: ~90 dB  Counting:79 dB  Reading (phrases): 78 dB           Simple conversation (4 minute segments): 68 dB Required occasional  verbal cues, modeling for carryover of intent /volume answering simple questions following structured practice. During short conversation pt currently averages between 67-68 dB without cueing. With mod to min cueing, pt loudness during conversation averages 70 dB. SLP will focus on integrating intent for speech at conversation level to optimize pt speech production for ADLs.   08/04/24: Vickie isn't asking me to repeat as frequently. Pt practiced /a/ and sentences BID since last session. Loud /a/ average 92dB, everyday sentences averaged 83dB. In sentence responses pt produced responses with average volume 67dB but improved to 68dB average with SLP request  for repeat. In sentence level responses pt maintained WNL volume 85% with some vocal tightness evident. In simple conversation he used WNL volume 70% of the time and when SLP cued him to speak out and use your best voice pt improved to 85% of the time.  08/02/24: Pt loud /a/ and sentences practiced each day since last session. Pt has seen three examples of louder speech with his son, during Sunday school, and the [employer] [facility].  Loud /a/ average 93dB, everyday sentences averaged 85dB. In sentence responses pt produced responses with average volume 67dB but improved to 68dB average with SLP request for repeat. SLP assisted pt with sentence responses with louder speech - average 69dB. SLP continued practice in conversation, with pt's average 68dB for the first  5 minutes and after this req'd mod cues for loudness, occasionally. SLP suspects this due to fatigue.    07/28/24: Pt practiced loud /a/ and everyday sentences x3 since last session - the expected amount.  Loud /a/ average 91dB, everyday sentences averaged 83dB. In sentence responses pt produced responses with average volume 67dB but improved to 68dB average with SLP request for repeat. Pt had difficulty today with trailing off from beginning to sentence to end of sentence. In conversation, limited/no carryover to conversational speech, even after practicing for the session.   07/26/24: Pt arrived with volume 67dB faded to 66dB after 2 minutes of conversation. SLP provided rare mod cues for improved volume but limited response from pt. Loud /a/ average 90dB, sentences averaged 84dB. In sentence/ responses pt maintained average 69dB - and stated S above.Pt shared he had not practiced much since last session due to computer issues of his wife which brought son and his family over to his house many of the last 4 days. SLP encouraged him to maintain frequency with HEP.   07/20/24: Pt arrived with volume 67dB in conversation. SLP with occasional mod  cues for improved volume but limited response from pt. SLP guided pt through loud /a/ and everyday sentences and pt's conversational speech after this was WNL volume. SLP directed pt with sentence level responses with pt's volume average 70dB with occasional min A for improved volume and intent. After 7 mintues of task SLP req'd to provide usual min-mod A. SLP provided homework for pt to complete 15 minutes daily for incr'd loudness/intent with speech.  07/13/24: SLP collaborated with pt to assist him in real-time feedback for louder speech in 60-second conversational segments. Pt req'd mod-max A usually for louder speech, average 66dB. Pt told SLP that if an emotional topic he will get softer consistently. After loud /a/ average 92dB, pt's loudness increased to 69dB over the next 3 mintues. After this loudness decreased again to average 67dB. SLP told pt he needed to practice each day and used this example to verify when pt practices his voice is louder. Everyday sentences were average 83dB.  07/11/24: SLP provided pt with PROM with instructions to fill out as if it were day after evaluation. Scored as above.  Loud /a/ average generated average 90dB. Pt produced everyday sentences with average 81dB. With sentence responses pt generated average 69dB with rare min A for loudness and intent. In short conversational segments of 20-30 seconds, pt averaged 69dB with occasional mod A.   07/06/24: PT NEEDS PROM NEXT SESSION. Today with loud /a/ pt generated average 91dB. Pt produced everyday sentences with average 81 dB. With sentence responses pt generated average In short conversational segments of 20 seconds, pt averaged 67 dB with loudness fade noted. SLP asked pt to practice with loud /a/, everyday sentences, and sentence responses.  07/04/24: Pt needs PROM next session. Pt had re-eval today, as this is day 61 since evaluation. See objective voice assessment above, for changes. In general, pt's volume has  slightly increased, but still below average. Today with loud /a/ pt generated average 89dB. SLP assisted pt in generating more applicable everyday sentences. Pt changed one of these. He produced these sentences with average 83 dB. In short conversational segments of 20 seconds, pt averaged 67 dB with loudness fade noted. SLP confirmed his practice regimen as 10 ah, 10 everyday sentences, and reading out loud for 3 minutes in 30 second intervals.   05/04/24: SLP worked with pt with iPad to  incr awareness of lower than WNL voice. Pt used granddad shouting voice to get more intentional speech with louder, WNL volume speech. SLP collaborated pt with reps of loud /a/ and his everyday sentences; SLP verified pt's everyday sentences would all still be pertinent/relevant - pt to change two (Alexa). SLP told him to pay close attention to things he says until next session and substitute another sentence into his list.  PATIENT EDUCATION: Education details: see treatment date Person educated: Patient Education method: Explanation, Demonstration, Verbal cues, and Handouts Education comprehension: verbalized understanding, returned demonstration, verbal cues required, and needs further education  HOME EXERCISE PROGRAM: Loud /a/ and everyday sentences, along with speech tasks matching ability level   GOALS: Goals reviewed with patient? Yes  SHORT TERM GOALS: Target date: 08/26/24  Pt will produce loud /a/ with at least low 90s dB average over three sessions  Baseline: Goal status: met  2.  Pt will produce 16/20 sentence responses with 70dB over two sessions  Baseline:  Goal status: ONGOING  3.  Pt will produce volume average 69dB in 3 minutes simple conversation with rare min A over three sessions  Baseline:  Goal status: ONGOING  4.  Pt will generate abdominal breathing 80% of the time when engaging in 3 minutes simple conversation in 2 sessions  Baseline:  Goal status: ONGOING   LONG TERM  GOALS: Target date: , 08/26/24  Pt will maintain average 69dB over 8 minute simple-mod complex conversation with rare min A in three sessions  Baseline:  Goal status: ONGOING  2.  Pt will remain 100% intelligible out of the speech room for 8 minute conversation, with rare nonverbal cues Baseline:  Goal status: ONGOING  3.  pt will use abdominal breathing 70% of the time in 8 minutes conversation in three sessions  Baseline:  Goal status: ONGOING  4.  Pt will score higher on CES than initial administration  Baseline:  Goal status: ONGOING   ASSESSMENT:  CLINICAL IMPRESSION: Patient is a 73 y.o. male who was seen today for treatment of speech clarity who demonstrated dysarthria most c/b low voice volume due to Parkinson's disease. See treatment date above for today's date for further details on today's session. When asked to produce speech with increased intent during day of eval, he improved volume in 1 minutes of simple conversation however volume decline noted after approx 30 seconds. Pt would benefit from skilled ST targeting loudness in conversation. At this time he does not endorse swallowing difficulty but SLP will monitor informally throughout therapy course and goal/s added PRN. Pt continues to use memory compensations targeted in previous therapy courses.   OBJECTIVE IMPAIRMENTS: Objective impairments include attention, memory, and dysarthria. These impairments are limiting patient from ADLs/IADLs and effectively communicating at home and in community.Factors affecting potential to achieve goals and functional outcome are previous level of function and severity of impairments. Patient will benefit from skilled SLP services to address above impairments and improve overall function.  REHAB POTENTIAL: Good  PLAN:  SLP FREQUENCY: 2x/week  SLP DURATION: 8 weeks  PLANNED INTERVENTIONS: 92507 Treatment of speech (30 or 45 min) , Environmental controls, Cueing hierachy,  Internal/external aids, Functional tasks, Multimodal communication approach, SLP instruction and feedback, Compensatory strategies, and Patient/family education    [employer], CF-SLP 08/11/2024, 4:07 PM

## 2024-08-15 ENCOUNTER — Encounter: Payer: Self-pay | Admitting: Family Medicine

## 2024-08-15 ENCOUNTER — Ambulatory Visit: Admitting: Family Medicine

## 2024-08-15 ENCOUNTER — Ambulatory Visit

## 2024-08-15 VITALS — BP 112/74 | HR 78 | Temp 98.0°F | Resp 16 | Ht 65.0 in | Wt 190.6 lb

## 2024-08-15 DIAGNOSIS — N3941 Urge incontinence: Secondary | ICD-10-CM | POA: Diagnosis not present

## 2024-08-15 DIAGNOSIS — R471 Dysarthria and anarthria: Secondary | ICD-10-CM | POA: Diagnosis not present

## 2024-08-15 DIAGNOSIS — L989 Disorder of the skin and subcutaneous tissue, unspecified: Secondary | ICD-10-CM

## 2024-08-15 MED ORDER — TROSPIUM CHLORIDE ER 60 MG PO CP24: 1.0000 | ORAL_CAPSULE | Freq: Every day | ORAL | 1 refills | Status: AC

## 2024-08-15 MED ORDER — KETOCONAZOLE 2 % EX CREA
1.0000 | TOPICAL_CREAM | Freq: Every day | CUTANEOUS | 0 refills | Status: AC
Start: 2024-08-15 — End: 2024-08-29

## 2024-08-15 NOTE — Therapy (Signed)
 OUTPATIENT SPEECH LANGUAGE PATHOLOGY PARKINSON'S TREATMENT   Patient Name: [name] [name] MRN: [id number] DOB:03-07-1951, 73 y.o., male Today's Date: 08/15/2024  PCP: [name] Netter, MD REFERRING PROVIDER: Evonnie Stabs, DO  END OF SESSION:  End of Session - 08/15/24 1714     Visit Number 12    Number of Visits 17    Date for Recertification  08/26/24    SLP Start Time 1619    SLP Stop Time  1659    SLP Time Calculation (min) 40 min    Activity Tolerance Patient tolerated treatment well                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/27/2014   Mild neurocognitive disorder due to Parkinson's disease 01/10/2022   Non-Hodgkin lymphoma 2007   diffuse- 6 cycles of chemo with R CHOP Rituxan - last dose 10/08   Nonspecific elevation of levels of transaminase or lactic acid dehydrogenase (LDH) 07/06/2008   Parkinson's disease 07/27/2014   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/02/2015   Past Surgical History:  Procedure Laterality Date   CHOLECYSTECTOMY, LAPAROSCOPIC     EYE SURGERY     x2 as child   PORT-A-CATH REMOVAL     PORTACATH PLACEMENT     SPLENECTOMY     TONSILLECTOMY     Patient Active Problem List   Diagnosis Date Noted   Mild neurocognitive disorder due to Parkinson's disease 01/10/2022   Gastroesophageal reflux disease 03/07/2020   Localized swelling of right lower extremity 04/12/2019   DOE (dyspnea on exertion) 07/15/2018   10 year risk of MI or stroke 7.5% or greater 04/12/2018   Post-splenectomy 04/02/2015   Meige syndrome (blepharospasm with oromandibular  dystonia) 07/27/2014   Dysphagia, neurologic 07/27/2014   Parkinson's disease (HCC) 07/27/2014   Dystonia 07/10/2014   Low back pain 11/21/2011   Nonspecific elevation of levels of transaminase or lactic acid dehydrogenase (LDH) 07/06/2008   Enlarged lymph nodes 07/28/2007   Non-Hodgkin lymphoma 03/23/2007    ONSET DATE: Dx in mid-2010's; script dated 04/12/24  REFERRING DIAG:  G20.A1 (ICD-10-CM) - Parkinson's disease without dyskinesia or fluctuating manifestations (HCC)      THERAPY DIAG:  Dysarthria and anarthria  Rationale for Evaluation and Treatment: Rehabilitation  SUBJECTIVE:   SUBJECTIVE STATEMENT:  Pt practiced homework sporadically since last session.  Pt accompanied by: self  PERTINENT HISTORY: Meige syndrome, insomnia, RBD. Pt well known to this SLP - previous ST course d/c'd in January 2025 with pt having met all LTGs.   PAIN:  Are you having pain? No  FALLS: Has patient fallen in last 6 months?  No, See PT evaluation for details  PATIENT GOALS: Improve with speech loudness   OBJECTIVE:  Note: Objective measures were completed at Evaluation unless otherwise noted.  DIAGNOSTIC FINDINGS:  From neuropsych evaluation report dated April 2023:  [name] [name] completed a comprehensive neuropsychological evaluation on 01/10/2022. Please refer to that encounter for the full report and recommendations. Briefly, results suggested isolated impaired performances across complex attention and semantic fluency. Performance variability was also exhibited across all aspects of learning and memory. Regarding etiology, the  most likely culprit for ongoing cognitive weakness remains his past history of Parkinson's disease. While processing speed was improved relative to what might be expected, dysfunction surrounding complex attention and encoding/retrieval deficits of memory are quite common in this illness. The fact that motor/gait dysfunction was noted years prior to concern  surrounding cognitive decline is also a typical timeline associated with Parkinson's disease. Mild anxiety and sleep dysfunction could further influence cognitive performances. Research surrounding cognitive side effects of pramipexole  is mixed; however, I cannot rule out an ongoing side effect contribution as well.   PATIENT REPORTED OUTCOME MEASURES (PROM): Communication Effectiveness Survey: pt scored himself 13/32, with lower scores indicating more challenges communicating given pt's deficits. 1 (not at all effective) was scored for a conversation in a noisy environment, speaking when emotionally upset/angry, a conversation in a car, and a conversation at a distance.Other responses were 2.  TODAY'S TREATMENT:                                                                                                                                         DATE:   08/15/24: Pt warmed up with Speak Out m-words, average 88dB, loud /a/ average 92dB. Read his everyday sentences with average 83dB. 2 minute conversational segments: pt req'd usual min-mod A for maintaining loudness faded to occasional min A- when SLP cued pt he improved volume to average 69dB.   08/11/24:  Targeted volume and intelligibility using Speak Out! Lesson 9. Pt required occasional mod verbal cues, modeling, for volume and breath support.   Pt averages the following volume levels:  Sustained AH: ~90 dB  Counting:79 dB  Reading (phrases): 78 dB           Simple conversation (4 minute segments): 68 dB Required occasional  verbal cues, modeling for carryover of intent /volume answering simple questions following structured practice. During short conversation pt currently averages between 67-68 dB without cueing. With mod to min cueing, pt loudness during conversation averages 70 dB. SLP will focus on integrating intent for speech at conversation level to optimize pt speech production for ADLs.   08/04/24: [name] [name] isn't asking me  to repeat as frequently. Pt practiced /a/ and sentences BID since last session. Loud /a/ average 92dB, everyday sentences averaged 83dB. In sentence responses pt produced responses with average volume 67dB but improved to 68dB average with SLP request for repeat. In sentence level responses pt maintained WNL volume 85% with some vocal tightness evident. In simple conversation he used WNL volume 70% of the time and when SLP cued him to speak out and use your best voice pt improved to 85% of the time.  08/02/24: Pt loud /a/ and sentences practiced each day since last session. Pt has seen three examples of louder speech with his son, during Sunday school, and the [employer] [facility].  Loud /a/ average 93dB, everyday sentences averaged 85dB. In sentence responses pt  produced responses with average volume 67dB but improved to 68dB average with SLP request for repeat. SLP assisted pt with sentence responses with louder speech - average 69dB. SLP continued practice in conversation, with pt's average 68dB for the first 5 minutes and after this req'd mod cues for loudness, occasionally. SLP suspects this due to fatigue.    07/28/24: Pt practiced loud /a/ and everyday sentences x3 since last session - the expected amount.  Loud /a/ average 91dB, everyday sentences averaged 83dB. In sentence responses pt produced responses with average volume 67dB but improved to 68dB average with SLP request for repeat. Pt had difficulty today with trailing off from beginning to sentence to end of sentence. In conversation, limited/no carryover to conversational speech, even after practicing for the session.   07/26/24: Pt arrived with volume 67dB faded to 66dB after 2 minutes of conversation. SLP provided rare mod cues for improved volume but limited response from pt. Loud /a/ average 90dB, sentences averaged 84dB. In sentence/ responses pt maintained average 69dB - and stated S above.Pt shared he had not practiced much since last  session due to computer issues of his wife which brought son and his family over to his house many of the last 4 days. SLP encouraged him to maintain frequency with HEP.   07/20/24: Pt arrived with volume 67dB in conversation. SLP with occasional mod cues for improved volume but limited response from pt. SLP guided pt through loud /a/ and everyday sentences and pt's conversational speech after this was WNL volume. SLP directed pt with sentence level responses with pt's volume average 70dB with occasional min A for improved volume and intent. After 7 mintues of task SLP req'd to provide usual min-mod A. SLP provided homework for pt to complete 15 minutes daily for incr'd loudness/intent with speech.  07/13/24: SLP collaborated with pt to assist him in real-time feedback for louder speech in 60-second conversational segments. Pt req'd mod-max A usually for louder speech, average 66dB. Pt told SLP that if an emotional topic he will get softer consistently. After loud /a/ average 92dB, pt's loudness increased to 69dB over the next 3 mintues. After this loudness decreased again to average 67dB. SLP told pt he needed to practice each day and used this example to verify when pt practices his voice is louder. Everyday sentences were average 83dB.  07/11/24: SLP provided pt with PROM with instructions to fill out as if it were day after evaluation. Scored as above.  Loud /a/ average generated average 90dB. Pt produced everyday sentences with average 81dB. With sentence responses pt generated average 69dB with rare min A for loudness and intent. In short conversational segments of 20-30 seconds, pt averaged 69dB with occasional mod A.   07/06/24: PT NEEDS PROM NEXT SESSION. Today with loud /a/ pt generated average 91dB. Pt produced everyday sentences with average 81 dB. With sentence responses pt generated average In short conversational segments of 20 seconds, pt averaged 67 dB with loudness fade noted. SLP asked pt  to practice with loud /a/, everyday sentences, and sentence responses.  07/04/24: Pt needs PROM next session. Pt had re-eval today, as this is day 61 since evaluation. See objective voice assessment above, for changes. In general, pt's volume has slightly increased, but still below average. Today with loud /a/ pt generated average 89dB. SLP assisted pt in generating more applicable everyday sentences. Pt changed one of these. He produced these sentences with average 83 dB. In short conversational segments of 20 seconds,  pt averaged 67 dB with loudness fade noted. SLP confirmed his practice regimen as 10 ah, 10 everyday sentences, and reading out loud for 3 minutes in 30 second intervals.   05/04/24: SLP worked with pt with iPad to incr awareness of lower than WNL voice. Pt used granddad shouting voice to get more intentional speech with louder, WNL volume speech. SLP collaborated pt with reps of loud /a/ and his everyday sentences; SLP verified pt's everyday sentences would all still be pertinent/relevant - pt to change two (Alexa). SLP told him to pay close attention to things he says until next session and substitute another sentence into his list.  PATIENT EDUCATION: Education details: see treatment date Person educated: Patient Education method: Explanation, Demonstration, Verbal cues, and Handouts Education comprehension: verbalized understanding, returned demonstration, verbal cues required, and needs further education  HOME EXERCISE PROGRAM: Loud /a/ and everyday sentences, along with speech tasks matching ability level   GOALS: Goals reviewed with patient? Yes  SHORT TERM GOALS: Target date: 08/26/24  Pt will produce loud /a/ with at least low 90s dB average over three sessions  Baseline: Goal status: met  2.  Pt will produce 16/20 sentence responses with 70dB over two sessions  Baseline: 08/15/24 Goal status: ONGOING  3.  Pt will produce volume average 69dB in 3 minutes  simple conversation with rare min A over three sessions  Baseline:  Goal status: ONGOING  4.  Pt will generate abdominal breathing 80% of the time when engaging in 3 minutes simple conversation in 2 sessions  Baseline:  Goal status: ONGOING   LONG TERM GOALS: Target date: , 08/26/24  Pt will maintain average 69dB over 8 minute simple-mod complex conversation with rare min A in three sessions  Baseline:  Goal status: ONGOING  2.  Pt will remain 100% intelligible out of the speech room for 8 minute conversation, with rare nonverbal cues Baseline:  Goal status: ONGOING  3.  pt will use abdominal breathing 70% of the time in 8 minutes conversation in three sessions  Baseline:  Goal status: ONGOING  4.  Pt will score higher on CES than initial administration  Baseline:  Goal status: ONGOING   ASSESSMENT:  CLINICAL IMPRESSION: Patient is a 73 y.o. male who was seen today for treatment of speech clarity who demonstrated dysarthria most c/b low voice volume due to Parkinson's disease. See treatment date above for today's date for further details on today's session. When asked to produce speech with increased intent during day of eval, he improved volume in 1 minutes of simple conversation however volume decline noted after approx 30 seconds. Pt would benefit from skilled ST targeting loudness in conversation. At this time he does not endorse swallowing difficulty but SLP will monitor informally throughout therapy course and goal/s added PRN. Pt continues to use memory compensations targeted in previous therapy courses.   OBJECTIVE IMPAIRMENTS: Objective impairments include attention, memory, and dysarthria. These impairments are limiting patient from ADLs/IADLs and effectively communicating at home and in community.Factors affecting potential to achieve goals and functional outcome are previous level of function and severity of impairments. Patient will benefit from skilled SLP services to  address above impairments and improve overall function.  REHAB POTENTIAL: Good  PLAN:  SLP FREQUENCY: 2x/week  SLP DURATION: 8 weeks  PLANNED INTERVENTIONS: 92507 Treatment of speech (30 or 45 min) , Environmental controls, Cueing hierachy, Internal/external aids, Functional tasks, Multimodal communication approach, SLP instruction and feedback, Compensatory strategies, and Patient/family education  [employer], CF-SLP 08/15/2024, 5:15 PM

## 2024-08-15 NOTE — Progress Notes (Signed)
 Chief Complaint  Patient presents with   Follow-up    Follow Up    Subjective: Patient is a 73 y.o. male here for f/u.  Patient was started on trospium  60 mg daily.  He reports compliance and no adverse effects.  His urination issues are significantly better.  No adverse effects with the medication overall.  He does not wish to make a change.  We also increase the dosage of his triamcinolone  ointment for his leg lesions.  This does not help.  They are still present.  They do not itch, hurt, or drain.  They are not spreading or getting worse.  Denies fevers.  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/27/2014   Mild neurocognitive disorder due to Parkinson's disease 01/10/2022   Non-Hodgkin lymphoma 2007   diffuse- 6 cycles of chemo with R CHOP Rituxan - last dose 10/08   Nonspecific elevation of levels of transaminase or lactic acid dehydrogenase (LDH) 07/06/2008   Parkinson's disease 07/27/2014   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  There is no scaling, redness, draining, TTP, or excessive warmth.  They do blanch. Lungs: No accessory muscle use Psych: Age appropriate judgment and insight, normal affect and mood  Assessment and Plan: Urge incontinence - Plan: Trospium  Chloride  60 MG CP24  Skin lesion  Chronic, stable.  Continue trospium  60 mg daily. Chronic, not stable.  Trial 2 weeks of Kenalog  2% topically.  If no significant proven, we could consider biopsy versus referral to dermatology. I will see him in 6 months for med check or as needed. The patient voiced understanding and agreement to the plan.  Mabel [place], [place] 08/15/24  12:39 PM

## 2024-08-15 NOTE — Patient Instructions (Signed)
 Send me a message with an update in 2 weeks if you wish to see a specialist or have a biopsy done.   Let us  know if you need anything.

## 2024-08-23 ENCOUNTER — Ambulatory Visit: Attending: Neurology

## 2024-08-23 DIAGNOSIS — R471 Dysarthria and anarthria: Secondary | ICD-10-CM | POA: Insufficient documentation

## 2024-08-23 DIAGNOSIS — R41841 Cognitive communication deficit: Secondary | ICD-10-CM | POA: Diagnosis present

## 2024-08-23 NOTE — Therapy (Signed)
 OUTPATIENT SPEECH LANGUAGE PATHOLOGY PARKINSON'S TREATMENT   Patient Name: [name] [name] MRN: [id number] DOB:1951/01/29, 73 y.o., male Today's Date: 08/23/2024  PCP: Frann Netter, MD REFERRING PROVIDER: Evonnie Stabs, DO  END OF SESSION:  End of Session - 08/23/24 1459     Visit Number 13    Number of Visits 17    Date for Recertification  08/26/24    SLP Start Time 1453    SLP Stop Time  1530    SLP Time Calculation (min) 37 min    Activity Tolerance Patient tolerated treatment well                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   Mild neurocognitive disorder due to Parkinson's disease 01/10/2022   Non-Hodgkin lymphoma 2007   diffuse- 6 cycles of chemo with R CHOP Rituxan - last dose 10/08   Nonspecific elevation of levels of transaminase or lactic acid dehydrogenase (LDH) 07/06/2008   Parkinson's disease 07/27/2014   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   Past Surgical History:  Procedure Laterality Date   CHOLECYSTECTOMY, LAPAROSCOPIC     EYE SURGERY     x2 as child   PORT-A-CATH REMOVAL     PORTACATH PLACEMENT     SPLENECTOMY     TONSILLECTOMY     Patient Active Problem List   Diagnosis Date Noted   Mild neurocognitive disorder due to Parkinson's disease 01/10/2022   Gastroesophageal reflux disease 03/07/2020   Localized swelling of right lower extremity 04/12/2019   DOE (dyspnea on exertion) 07/15/2018   10 year risk of MI or stroke 7.5% or greater 04/12/2018   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/2015   Meige syndrome (blepharospasm with oromandibular  dystonia) 07/27/2014   Dysphagia, neurologic 07/27/2014   Parkinson's disease (HCC) 07/27/2014   Dystonia 07/10/2014   Low back pain 11/21/2011   Nonspecific elevation of levels of transaminase or lactic acid dehydrogenase (LDH) 07/06/2008   Enlarged lymph nodes 07/28/2007   Non-Hodgkin lymphoma 03/23/2007    ONSET DATE: Dx in mid-2010's; script dated 04/12/24  REFERRING DIAG:  G20.A1 (ICD-10-CM) - Parkinson's disease without dyskinesia or fluctuating manifestations (HCC)      THERAPY DIAG:  Dysarthria and anarthria  Rationale for Evaluation and Treatment: Rehabilitation  SUBJECTIVE:   SUBJECTIVE STATEMENT:  I was at a gathering yesterday and nobody asked me to repeat.  Pt accompanied by: self  PERTINENT HISTORY: Meige syndrome, insomnia, RBD. Pt well known to this SLP - previous ST course d/c'd in January 2025 with pt having met all LTGs.   PAIN:  Are you having pain? No  FALLS: Has patient fallen in last 6 months?  No, See PT evaluation for details  PATIENT GOALS: Improve with speech loudness   OBJECTIVE:  Note: Objective measures were completed at Evaluation unless otherwise noted.  DIAGNOSTIC FINDINGS:  From neuropsych evaluation report dated April 2023:  Mr. Strutz completed a comprehensive neuropsychological evaluation on 01/10/2022. Please refer to that encounter for the full report and recommendations. Briefly, results suggested isolated impaired performances across complex attention and semantic fluency. Performance variability was also exhibited across all aspects of learning  and memory. Regarding etiology, the most likely culprit for ongoing cognitive weakness remains his past history of Parkinson's disease. While processing speed was improved relative to what might be expected, dysfunction surrounding complex attention and encoding/retrieval deficits of memory are quite common in this illness. The fact that motor/gait dysfunction was noted years prior to  concern surrounding cognitive decline is also a typical timeline associated with Parkinson's disease. Mild anxiety and sleep dysfunction could further influence cognitive performances. Research surrounding cognitive side effects of pramipexole  is mixed; however, I cannot rule out an ongoing side effect contribution as well.   PATIENT REPORTED OUTCOME MEASURES (PROM): Communication Effectiveness Survey: pt scored himself 13/32, with lower scores indicating more challenges communicating given pt's deficits. 1 (not at all effective) was scored for a conversation in a noisy environment, speaking when emotionally upset/angry, a conversation in a car, and a conversation at a distance.Other responses were 2.  TODAY'S TREATMENT:                                                                                                                                         DATE:   08/23/24:SLP assessed pt's progress in conversation - first 14 minutes SLP provided occasional min cues for loudness. After this SLP had to increase frequency and intensity of cueing to usual min-mod A for loudness until 23 minutes conversation total. SLP then talked with/educated pt about being more succinct with his conversation initially in order to maximize length of talking time he will have for conversations >10 minutes.  Pt stated he will practice m-words, /a/, and sentences once today, tomorrow, and once Thursday prior to his appointment.   08/15/24: Pt warmed up with Speak Out m-words, average 88dB, loud /a/ average 92dB. Read his everyday sentences with average 83dB. 2 minute conversational segments: pt req'd usual min-mod A for maintaining loudness faded to occasional min A- when SLP cued pt he improved volume to average 69dB.   08/11/24:  Targeted volume and intelligibility using Speak Out! Lesson 9. Pt required occasional mod verbal cues, modeling, for volume and breath support.   Pt averages the following volume  levels:  Sustained AH: ~90 dB  Counting:79 dB  Reading (phrases): 78 dB           Simple conversation (4 minute segments): 68 dB Required occasional  verbal cues, modeling for carryover of intent /volume answering simple questions following structured practice. During short conversation pt currently averages between 67-68 dB without cueing. With mod to min cueing, pt loudness during conversation averages 70 dB. SLP will focus on integrating intent for speech at conversation level to optimize pt speech production for ADLs.   08/04/24: Vickie isn't asking me to repeat as frequently. Pt practiced /a/ and sentences BID since last session. Loud /a/ average 92dB, everyday sentences averaged 83dB. In sentence responses pt produced responses with average volume 67dB but improved to 68dB average  with SLP request for repeat. In sentence level responses pt maintained WNL volume 85% with some vocal tightness evident. In simple conversation he used WNL volume 70% of the time and when SLP cued him to speak out and use your best voice pt improved to 85% of the time.  08/02/24: Pt loud /a/ and sentences practiced each day since last session. Pt has seen three examples of louder speech with his son, during Sunday school, and the [facility] group.  Loud /a/ average 93dB, everyday sentences averaged 85dB. In sentence responses pt produced responses with average volume 67dB but improved to 68dB average with SLP request for repeat. SLP assisted pt with sentence responses with louder speech - average 69dB. SLP continued practice in conversation, with pt's average 68dB for the first 5 minutes and after this req'd mod cues for loudness, occasionally. SLP suspects this due to fatigue.    07/28/24: Pt practiced loud /a/ and everyday sentences x3 since last session - the expected amount.  Loud /a/ average 91dB, everyday sentences averaged 83dB. In sentence responses pt produced responses with average volume 67dB but  improved to 68dB average with SLP request for repeat. Pt had difficulty today with trailing off from beginning to sentence to end of sentence. In conversation, limited/no carryover to conversational speech, even after practicing for the session.   07/26/24: Pt arrived with volume 67dB faded to 66dB after 2 minutes of conversation. SLP provided rare mod cues for improved volume but limited response from pt. Loud /a/ average 90dB, sentences averaged 84dB. In sentence/ responses pt maintained average 69dB - and stated S above.Pt shared he had not practiced much since last session due to computer issues of his wife which brought son and his family over to his house many of the last 4 days. SLP encouraged him to maintain frequency with HEP.   07/20/24: Pt arrived with volume 67dB in conversation. SLP with occasional mod cues for improved volume but limited response from pt. SLP guided pt through loud /a/ and everyday sentences and pt's conversational speech after this was WNL volume. SLP directed pt with sentence level responses with pt's volume average 70dB with occasional min A for improved volume and intent. After 7 mintues of task SLP req'd to provide usual min-mod A. SLP provided homework for pt to complete 15 minutes daily for incr'd loudness/intent with speech.  07/13/24: SLP collaborated with pt to assist him in real-time feedback for louder speech in 60-second conversational segments. Pt req'd mod-max A usually for louder speech, average 66dB. Pt told SLP that if an emotional topic he will get softer consistently. After loud /a/ average 92dB, pt's loudness increased to 69dB over the next 3 mintues. After this loudness decreased again to average 67dB. SLP told pt he needed to practice each day and used this example to verify when pt practices his voice is louder. Everyday sentences were average 83dB.  07/11/24: SLP provided pt with PROM with instructions to fill out as if it were day after evaluation.  Scored as above.  Loud /a/ average generated average 90dB. Pt produced everyday sentences with average 81dB. With sentence responses pt generated average 69dB with rare min A for loudness and intent. In short conversational segments of 20-30 seconds, pt averaged 69dB with occasional mod A.   07/06/24: PT NEEDS PROM NEXT SESSION. Today with loud /a/ pt generated average 91dB. Pt produced everyday sentences with average 81 dB. With sentence responses pt generated average In short conversational segments of 20 seconds,  pt averaged 67 dB with loudness fade noted. SLP asked pt to practice with loud /a/, everyday sentences, and sentence responses.  07/04/24: Pt needs PROM next session. Pt had re-eval today, as this is day 61 since evaluation. See objective voice assessment above, for changes. In general, pt's volume has slightly increased, but still below average. Today with loud /a/ pt generated average 89dB. SLP assisted pt in generating more applicable everyday sentences. Pt changed one of these. He produced these sentences with average 83 dB. In short conversational segments of 20 seconds, pt averaged 67 dB with loudness fade noted. SLP confirmed his practice regimen as 10 ah, 10 everyday sentences, and reading out loud for 3 minutes in 30 second intervals.   05/04/24: SLP worked with pt with iPad to incr awareness of lower than WNL voice. Pt used granddad shouting voice to get more intentional speech with louder, WNL volume speech. SLP collaborated pt with reps of loud /a/ and his everyday sentences; SLP verified pt's everyday sentences would all still be pertinent/relevant - pt to change two (Alexa). SLP told him to pay close attention to things he says until next session and substitute another sentence into his list.  PATIENT EDUCATION: Education details: see treatment date Person educated: Patient Education method: Explanation, Demonstration, Verbal cues, and Handouts Education comprehension:  verbalized understanding, returned demonstration, verbal cues required, and needs further education  HOME EXERCISE PROGRAM: Loud /a/ and everyday sentences, along with speech tasks matching ability level   GOALS: Goals reviewed with patient? Yes  SHORT TERM GOALS: Target date:  07/29/24 (first ST not until week of 07/04/24)   Pt will produce loud /a/ with at least low 90s dB average over three sessions  Baseline: Goal status: met  2.  Pt will produce 16/20 sentence responses with 70dB over two sessions  Baseline: 08/15/24 Goal status: not met  3.  Pt will produce volume average 69dB in 3 minutes simple conversation with rare min A over three sessions  Baseline: 08/23/24 Goal status: not met  4.  Pt will generate abdominal breathing 80% of the time when engaging in 3 minutes simple conversation in 2 sessions  Baseline:  Goal status: deferred - not targeted   LONG TERM GOALS: Target date: , 08/26/24  Pt will maintain average 69dB over 8 minute simple-mod complex conversation with rare min A in three sessions  Baseline:  Goal status: ONGOING  2.  Pt will remain 100% intelligible out of the speech room for 8 minute conversation, with rare nonverbal cues Baseline:  Goal status: ONGOING  3.  pt will use abdominal breathing 70% of the time in 8 minutes conversation in three sessions  Baseline:  Goal status: ONGOING  4.  Pt will score higher on CES than initial administration  Baseline:  Goal status: ONGOING   ASSESSMENT:  CLINICAL IMPRESSION: Patient is a 73 y.o. male who was seen today for treatment of speech clarity who demonstrated dysarthria most c/b low voice volume due to Parkinson's disease. See treatment date above for today's date for further details on today's session. When asked to produce speech with increased intent during day of eval, he improved volume in 1 minutes of simple conversation however volume decline noted after approx 30 seconds. Pt would benefit  from skilled ST targeting loudness in conversation. At this time he does not endorse swallowing difficulty but SLP will monitor informally throughout therapy course and goal/s added PRN. Pt continues to use memory compensations targeted in previous therapy courses.  OBJECTIVE IMPAIRMENTS: Objective impairments include attention, memory, and dysarthria. These impairments are limiting patient from ADLs/IADLs and effectively communicating at home and in community.Factors affecting potential to achieve goals and functional outcome are previous level of function and severity of impairments. Patient will benefit from skilled SLP services to address above impairments and improve overall function.  REHAB POTENTIAL: Good  PLAN:  SLP FREQUENCY: 2x/week  SLP DURATION: 8 weeks  PLANNED INTERVENTIONS: 92507 Treatment of speech (30 or 45 min) , Environmental controls, Cueing hierachy, Internal/external aids, Functional tasks, Multimodal communication approach, SLP instruction and feedback, Compensatory strategies, and Patient/family education    Lupita Connor, MS, SLP 08/23/2024, 3:06 PM

## 2024-08-24 NOTE — Therapy (Incomplete)
 OUTPATIENT SPEECH LANGUAGE PATHOLOGY PARKINSON'S TREATMENT   Patient Name: [name] [name] MRN: [id number] DOB:1951/06/18, 73 y.o., male Today's Date: 08/24/2024  PCP: Frann Netter, MD REFERRING PROVIDER: Evonnie Stabs, DO  END OF SESSION:          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   Mild neurocognitive disorder due to Parkinson's disease 01/10/2022   Non-Hodgkin lymphoma 2007   diffuse- 6 cycles of chemo with R CHOP Rituxan - last dose 10/08   Nonspecific elevation of levels of transaminase or lactic acid dehydrogenase (LDH) 07/06/2008   Parkinson's disease 07/27/2014   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   Past Surgical History:  Procedure Laterality Date   CHOLECYSTECTOMY, LAPAROSCOPIC     EYE SURGERY     x2 as child   PORT-A-CATH REMOVAL     PORTACATH PLACEMENT     SPLENECTOMY     TONSILLECTOMY     Patient Active Problem List   Diagnosis Date Noted   Mild neurocognitive disorder due to Parkinson's disease 01/10/2022   Gastroesophageal reflux disease 03/07/2020   Localized swelling of right lower extremity 04/12/2019   DOE (dyspnea on exertion) 07/15/2018   10 year risk of MI or stroke 7.5% or greater 04/12/2018   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  Rationale for Evaluation and Treatment: Rehabilitation  SUBJECTIVE:   SUBJECTIVE STATEMENT:  I was at a gathering yesterday and nobody asked me to repeat.  Pt accompanied by: self  PERTINENT HISTORY: Meige syndrome, insomnia, RBD. Pt well known to this SLP - previous ST course d/c'd in January 2025 with pt having met all LTGs.   PAIN:  Are you having pain? No  FALLS: Has patient fallen in last 6 months?  No, See PT evaluation for details  PATIENT GOALS: Improve with speech loudness   OBJECTIVE:  Note: Objective measures were completed at Evaluation unless otherwise noted.  DIAGNOSTIC FINDINGS:  From neuropsych evaluation report dated April 2023:  Mr. Vivanco completed a comprehensive neuropsychological evaluation on 01/10/2022. Please refer to that encounter for the full report and recommendations. Briefly, results suggested isolated impaired performances across complex attention and semantic fluency. Performance variability was also exhibited across all aspects of learning and memory. Regarding etiology, the most likely culprit for ongoing cognitive weakness remains his past history of Parkinson's disease. While processing speed was improved relative to what might be expected, dysfunction surrounding complex attention and encoding/retrieval deficits of memory are quite common in this illness. The fact that motor/gait dysfunction was noted years prior to concern surrounding cognitive decline is also a typical timeline associated with Parkinson's disease.  Mild anxiety and sleep dysfunction could further influence cognitive performances. Research surrounding cognitive side effects of pramipexole  is mixed; however, I cannot rule out  an ongoing side effect contribution as well.   PATIENT REPORTED OUTCOME MEASURES (PROM): Communication Effectiveness Survey: pt scored himself 13/32, with lower scores indicating more challenges communicating given pt's deficits. 1 (not at all effective) was scored for a conversation in a noisy environment, speaking when emotionally upset/angry, a conversation in a car, and a conversation at a distance.Other responses were 2.  TODAY'S TREATMENT:                                                                                                                                         DATE:   08/25/24:   08/23/24:SLP assessed pt's progress in conversation - first 14 minutes SLP provided occasional min cues for loudness. After this SLP had to increase frequency and intensity of cueing to usual min-mod A for loudness until 23 minutes conversation total. SLP then talked with/educated pt about being more succinct with his conversation initially in order to maximize length of talking time he will have for conversations >10 minutes.  Pt stated he will practice m-words, /a/, and sentences once today, tomorrow, and once Thursday prior to his appointment.   08/15/24: Pt warmed up with Speak Out m-words, average 88dB, loud /a/ average 92dB. Read his everyday sentences with average 83dB. 2 minute conversational segments: pt req'd usual min-mod A for maintaining loudness faded to occasional min A- when SLP cued pt he improved volume to average 69dB.   08/11/24:  Targeted volume and intelligibility using Speak Out! Lesson 9. Pt required occasional mod verbal cues, modeling, for volume and breath support.   Pt averages the following volume levels:  Sustained AH: ~90 dB  Counting:79 dB  Reading (phrases): 78 dB           Simple conversation (4 minute segments): 68 dB Required occasional  verbal cues, modeling for carryover of intent /volume answering simple questions following structured practice. During  short conversation pt currently averages between 67-68 dB without cueing. With mod to min cueing, pt loudness during conversation averages 70 dB. SLP will focus on integrating intent for speech at conversation level to optimize pt speech production for ADLs.   08/04/24: Vickie isn't asking me to repeat as frequently. Pt practiced /a/ and sentences BID since last session. Loud /a/ average 92dB, everyday sentences averaged 83dB. In sentence responses pt produced responses with average volume 67dB but improved to 68dB average with SLP request for repeat. In sentence level responses pt maintained WNL volume 85% with some vocal tightness evident. In simple conversation he used WNL volume 70% of the time and when SLP cued him to speak out and use your best voice pt improved to 85% of the time.  08/02/24: Pt loud /a/ and sentences practiced each day since last session. Pt has  seen three examples of louder speech with his son, during Sunday school, and the [employer] group.  Loud /a/ average 93dB, everyday sentences averaged 85dB. In sentence responses pt produced responses with average volume 67dB but improved to 68dB average with SLP request for repeat. SLP assisted pt with sentence responses with louder speech - average 69dB. SLP continued practice in conversation, with pt's average 68dB for the first 5 minutes and after this req'd mod cues for loudness, occasionally. SLP suspects this due to fatigue.    07/28/24: Pt practiced loud /a/ and everyday sentences x3 since last session - the expected amount.  Loud /a/ average 91dB, everyday sentences averaged 83dB. In sentence responses pt produced responses with average volume 67dB but improved to 68dB average with SLP request for repeat. Pt had difficulty today with trailing off from beginning to sentence to end of sentence. In conversation, limited/no carryover to conversational speech, even after practicing for the session.   07/26/24: Pt arrived with  volume 67dB faded to 66dB after 2 minutes of conversation. SLP provided rare mod cues for improved volume but limited response from pt. Loud /a/ average 90dB, sentences averaged 84dB. In sentence/ responses pt maintained average 69dB - and stated S above.Pt shared he had not practiced much since last session due to computer issues of his wife which brought son and his family over to his house many of the last 4 days. SLP encouraged him to maintain frequency with HEP.   07/20/24: Pt arrived with volume 67dB in conversation. SLP with occasional mod cues for improved volume but limited response from pt. SLP guided pt through loud /a/ and everyday sentences and pt's conversational speech after this was WNL volume. SLP directed pt with sentence level responses with pt's volume average 70dB with occasional min A for improved volume and intent. After 7 mintues of task SLP req'd to provide usual min-mod A. SLP provided homework for pt to complete 15 minutes daily for incr'd loudness/intent with speech.  07/13/24: SLP collaborated with pt to assist him in real-time feedback for louder speech in 60-second conversational segments. Pt req'd mod-max A usually for louder speech, average 66dB. Pt told SLP that if an emotional topic he will get softer consistently. After loud /a/ average 92dB, pt's loudness increased to 69dB over the next 3 mintues. After this loudness decreased again to average 67dB. SLP told pt he needed to practice each day and used this example to verify when pt practices his voice is louder. Everyday sentences were average 83dB.  07/11/24: SLP provided pt with PROM with instructions to fill out as if it were day after evaluation. Scored as above.  Loud /a/ average generated average 90dB. Pt produced everyday sentences with average 81dB. With sentence responses pt generated average 69dB with rare min A for loudness and intent. In short conversational segments of 20-30 seconds, pt averaged 69dB with  occasional mod A.   07/06/24: PT NEEDS PROM NEXT SESSION. Today with loud /a/ pt generated average 91dB. Pt produced everyday sentences with average 81 dB. With sentence responses pt generated average In short conversational segments of 20 seconds, pt averaged 67 dB with loudness fade noted. SLP asked pt to practice with loud /a/, everyday sentences, and sentence responses.  07/04/24: Pt needs PROM next session. Pt had re-eval today, as this is day 61 since evaluation. See objective voice assessment above, for changes. In general, pt's volume has slightly increased, but still below average. Today with loud /a/ pt generated average 89dB.  SLP assisted pt in generating more applicable everyday sentences. Pt changed one of these. He produced these sentences with average 83 dB. In short conversational segments of 20 seconds, pt averaged 67 dB with loudness fade noted. SLP confirmed his practice regimen as 10 ah, 10 everyday sentences, and reading out loud for 3 minutes in 30 second intervals.   05/04/24: SLP worked with pt with iPad to incr awareness of lower than WNL voice. Pt used granddad shouting voice to get more intentional speech with louder, WNL volume speech. SLP collaborated pt with reps of loud /a/ and his everyday sentences; SLP verified pt's everyday sentences would all still be pertinent/relevant - pt to change two (Alexa). SLP told him to pay close attention to things he says until next session and substitute another sentence into his list.  PATIENT EDUCATION: Education details: see treatment date Person educated: Patient Education method: Explanation, Demonstration, Verbal cues, and Handouts Education comprehension: verbalized understanding, returned demonstration, verbal cues required, and needs further education  HOME EXERCISE PROGRAM: Loud /a/ and everyday sentences, along with speech tasks matching ability level   GOALS: Goals reviewed with patient? Yes  SHORT TERM GOALS:  Target date:  07/29/24 (first ST not until week of 07/04/24)   Pt will produce loud /a/ with at least low 90s dB average over three sessions  Baseline: Goal status: met  2.  Pt will produce 16/20 sentence responses with 70dB over two sessions  Baseline: 08/15/24 Goal status: not met  3.  Pt will produce volume average 69dB in 3 minutes simple conversation with rare min A over three sessions  Baseline: 08/23/24 Goal status: not met  4.  Pt will generate abdominal breathing 80% of the time when engaging in 3 minutes simple conversation in 2 sessions  Baseline:  Goal status: deferred - not targeted   LONG TERM GOALS: Target date: , 08/26/24  Pt will maintain average 69dB over 8 minute simple-mod complex conversation with rare min A in three sessions  Baseline:  Goal status: ONGOING  2.  Pt will remain 100% intelligible out of the speech room for 8 minute conversation, with rare nonverbal cues Baseline:  Goal status: ONGOING  3.  pt will use abdominal breathing 70% of the time in 8 minutes conversation in three sessions  Baseline:  Goal status: ONGOING  4.  Pt will score higher on CES than initial administration  Baseline:  Goal status: ONGOING   ASSESSMENT:  CLINICAL IMPRESSION: Patient is a 73 y.o. male who was seen today for treatment of speech clarity who demonstrated dysarthria most c/b low voice volume due to Parkinson's disease. See treatment date above for today's date for further details on today's session. When asked to produce speech with increased intent during day of eval, he improved volume in 1 minutes of simple conversation however volume decline noted after approx 30 seconds. Pt would benefit from skilled ST targeting loudness in conversation. At this time he does not endorse swallowing difficulty but SLP will monitor informally throughout therapy course and goal/s added PRN. Pt continues to use memory compensations targeted in previous therapy courses.    OBJECTIVE IMPAIRMENTS: Objective impairments include attention, memory, and dysarthria. These impairments are limiting patient from ADLs/IADLs and effectively communicating at home and in community.Factors affecting potential to achieve goals and functional outcome are previous level of function and severity of impairments. Patient will benefit from skilled SLP services to address above impairments and improve overall function.  REHAB POTENTIAL: Good  PLAN:  SLP FREQUENCY: 2x/week  SLP DURATION: 8 weeks  PLANNED INTERVENTIONS: 92507 Treatment of speech (30 or 45 min) , Environmental controls, Cueing hierachy, Internal/external aids, Functional tasks, Multimodal communication approach, SLP instruction and feedback, Compensatory strategies, and Patient/family education    Lupita Connor, MS, SLP 08/24/2024, 1:56 PM

## 2024-08-25 ENCOUNTER — Ambulatory Visit

## 2024-08-25 DIAGNOSIS — R471 Dysarthria and anarthria: Secondary | ICD-10-CM | POA: Diagnosis not present

## 2024-08-25 DIAGNOSIS — R41841 Cognitive communication deficit: Secondary | ICD-10-CM

## 2024-08-25 NOTE — Therapy (Signed)
 OUTPATIENT SPEECH LANGUAGE PATHOLOGY PARKINSON'S TREATMENT   Patient Name: [name] [name] MRN: [id number] DOB:Dec 27, 1950, 73 y.o., male Today's Date: 08/25/2024  PCP: Frann Netter, MD REFERRING PROVIDER: Evonnie Stabs, DO  END OF SESSION:  End of Session - 08/25/24 1525     Visit Number 14    Number of Visits 17    Date for Recertification  08/26/24    SLP Start Time 1522    SLP Stop Time  1602    SLP Time Calculation (min) 40 min    Activity Tolerance Patient tolerated treatment well                 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   Mild neurocognitive disorder due to Parkinson's disease 01/10/2022   Non-Hodgkin lymphoma 2007   diffuse- 6 cycles of chemo with R [facility]OP Rituxan - last dose 10/08   Nonspecific elevation of levels of transaminase or lactic acid dehydrogenase (LDH) 07/06/2008   Parkinson's disease 07/27/2014   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/02/2015   Past Surgical History:  Procedure Laterality Date   CHOLECYSTECTOMY, LAPAROSCOPIC     EYE SURGERY     x2 as child   PORT-A-CATH REMOVAL     PORTACATH PLACEMENT     SPLENECTOMY     TONSILLECTOMY     Patient Active Problem List   Diagnosis Date Noted   Mild neurocognitive disorder due to Parkinson's disease 01/10/2022   Gastroesophageal reflux disease 03/07/2020   Localized swelling of right lower extremity 04/12/2019   DOE (dyspnea on exertion) 07/15/2018   10 year risk of MI or stroke 7.5% or greater 04/12/2018   Post-splenectomy 04/02/2015   Meige syndrome (blepharospasm with oromandibular  dystonia) 07/27/2014   Dysphagia, neurologic 07/27/2014   Parkinson's disease (HCC) 07/27/2014   Dystonia 07/10/2014   Low back pain 11/21/2011   Nonspecific elevation of levels of transaminase or lactic acid dehydrogenase (LDH) 07/06/2008   Enlarged lymph nodes 07/28/2007   Non-Hodgkin lymphoma 03/23/2007    ONSET DATE: Dx in mid-2010's; script dated 04/12/24  REFERRING DIAG:  G20.A1 (ICD-10-CM) - Parkinson's disease without dyskinesia or fluctuating manifestations (HCC)      THERAPY DIAG:  Dysarthria and anarthria  Cognitive communication deficit  Rationale for Evaluation and Treatment: Rehabilitation  SUBJECTIVE:   SUBJECTIVE STATEMENT:  I was at a gathering yesterday and nobody asked me to repeat.  Pt accompanied by: self  PERTINENT HISTORY: Meige syndrome, insomnia, RBD. Pt well known to this SLP - previous ST course d/c'd in January 2025 with pt having met all LTGs.   PAIN:  Are you having pain? No  FALLS: Has patient fallen in last 6 months?  No, See PT evaluation for details  PATIENT GOALS: Improve with speech loudness   OBJECTIVE:  Note: Objective measures were completed at Evaluation unless otherwise noted.  DIAGNOSTIC FINDINGS:  From neuropsych evaluation report dated April 2023:  Mr. Leach completed a comprehensive neuropsychological evaluation on 01/10/2022. Please refer to that encounter for the full report and recommendations. Briefly, results suggested isolated impaired performances across complex attention and semantic fluency. Performance variability was also exhibited  across all aspects of learning and memory. Regarding etiology, the most likely culprit for ongoing cognitive weakness remains his past history of Parkinson's disease. While processing speed was improved relative to what might be expected, dysfunction surrounding complex attention and encoding/retrieval deficits of memory are quite common in this illness. The fact that motor/gait  dysfunction was noted years prior to concern surrounding cognitive decline is also a typical timeline associated with Parkinson's disease. Mild anxiety and sleep dysfunction could further influence cognitive performances. Research surrounding cognitive side effects of pramipexole  is mixed; however, I cannot rule out an ongoing side effect contribution as well.   PATIENT REPORTED OUTCOME MEASURES (PROM): Communication Effectiveness Survey: pt scored himself 13/32, with lower scores indicating more challenges communicating given pt's deficits. 1 (not at all effective) was scored for a conversation in a noisy environment, speaking when emotionally upset/angry, a conversation in a car, and a conversation at a distance.Other responses were 2.  TODAY'S TREATMENT:                                                                                                                                         DATE:   08/25/24: First 10 minutes of therapy was in conversation with pt - average 68dB with rare min A- pt was 100% intelligible. Loud /a/ was produced today with average 90dB. Conversation for 15 minutes after this was average 69db with occasional min A increased to occasional mod A for maintaining loudness. SLP suggested pt have external cue for loudness during the day, and that he choose 30  minutes x3/day to focus on using intentional and purposeful speech.  08/23/24:SLP assessed pt's progress in conversation - first 14 minutes SLP provided occasional min cues for loudness. After this SLP had to increase frequency and intensity of cueing to usual min-mod A for loudness until 23 minutes conversation total. SLP then talked with/educated pt about being more succinct with his conversation initially in order to maximize length of talking time he will have for conversations >10 minutes.  Pt stated he will practice m-words, /a/, and sentences once today, tomorrow, and once Thursday prior to his appointment.    08/15/24: Pt warmed up with Speak Out m-words, average 88dB, loud /a/ average 92dB. Read his everyday sentences with average 83dB. 2 minute conversational segments: pt req'd usual min-mod A for maintaining loudness faded to occasional min A- when SLP cued pt he improved volume to average 69dB.   08/11/24:  Targeted volume and intelligibility using Speak Out! Lesson 9. Pt required occasional mod verbal cues, modeling, for volume and breath support.   Pt averages the following volume levels:  Sustained AH: ~90 dB  Counting:79 dB  Reading (phrases): 78 dB           Simple conversation (4 minute segments): 68 dB Required occasional  verbal cues, modeling for carryover of intent /volume answering simple questions following structured practice.  During short conversation pt currently averages between 67-68 dB without cueing. With mod to min cueing, pt loudness during conversation averages 70 dB. SLP will focus on integrating intent for speech at conversation level to optimize pt speech production for ADLs.   08/04/24: Vickie isn't asking me to repeat as frequently. Pt practiced /a/ and sentences BID since last session. Loud /a/ average 92dB, everyday sentences averaged 83dB. In sentence responses pt produced responses with average volume 67dB but improved to 68dB average with SLP request for repeat. In sentence level responses pt maintained WNL volume 85% with some vocal tightness evident. In simple conversation he used WNL volume 70% of the time and when SLP cued him to speak out and use your best voice pt improved to 85% of the time.  08/02/24: Pt loud /a/ and sentences practiced each day since last session. Pt has seen three examples of louder speech with his son, during Sunday school, and the [facility] group.  Loud /a/ average 93dB, everyday sentences averaged 85dB. In sentence responses pt produced responses with average volume 67dB but improved to 68dB average with SLP request for  repeat. SLP assisted pt with sentence responses with louder speech - average 69dB. SLP continued practice in conversation, with pt's average 68dB for the first 5 minutes and after this req'd mod cues for loudness, occasionally. SLP suspects this due to fatigue.    07/28/24: Pt practiced loud /a/ and everyday sentences x3 since last session - the expected amount.  Loud /a/ average 91dB, everyday sentences averaged 83dB. In sentence responses pt produced responses with average volume 67dB but improved to 68dB average with SLP request for repeat. Pt had difficulty today with trailing off from beginning to sentence to end of sentence. In conversation, limited/no carryover to conversational speech, even after practicing for the session.   07/26/24: Pt arrived with volume 67dB faded to 66dB after 2 minutes of conversation. SLP provided rare mod cues for improved volume but limited response from pt. Loud /a/ average 90dB, sentences averaged 84dB. In sentence/ responses pt maintained average 69dB - and stated S above.Pt shared he had not practiced much since last session due to computer issues of his wife which brought son and his family over to his house many of the last 4 days. SLP encouraged him to maintain frequency with HEP.   07/20/24: Pt arrived with volume 67dB in conversation. SLP with occasional mod cues for improved volume but limited response from pt. SLP guided pt through loud /a/ and everyday sentences and pt's conversational speech after this was WNL volume. SLP directed pt with sentence level responses with pt's volume average 70dB with occasional min A for improved volume and intent. After 7 mintues of task SLP req'd to provide usual min-mod A. SLP provided homework for pt to complete 15 minutes daily for incr'd loudness/intent with speech.  07/13/24: SLP collaborated with pt to assist him in real-time feedback for louder speech in 60-second conversational segments. Pt req'd mod-max A usually for  louder speech, average 66dB. Pt told SLP that if an emotional topic he will get softer consistently. After loud /a/ average 92dB, pt's loudness increased to 69dB over the next 3 mintues. After this loudness decreased again to average 67dB. SLP told pt he needed to practice each day and used this example to verify when pt practices his voice is louder. Everyday sentences were average 83dB.  07/11/24: SLP provided pt with PROM with instructions to fill out as if it were day after  evaluation. Scored as above.  Loud /a/ average generated average 90dB. Pt produced everyday sentences with average 81dB. With sentence responses pt generated average 69dB with rare min A for loudness and intent. In short conversational segments of 20-30 seconds, pt averaged 69dB with occasional mod A.   07/06/24: PT NEEDS PROM NEXT SESSION. Today with loud /a/ pt generated average 91dB. Pt produced everyday sentences with average 81 dB. With sentence responses pt generated average In short conversational segments of 20 seconds, pt averaged 67 dB with loudness fade noted. SLP asked pt to practice with loud /a/, everyday sentences, and sentence responses.  07/04/24: Pt needs PROM next session. Pt had re-eval today, as this is day 61 since evaluation. See objective voice assessment above, for changes. In general, pt's volume has slightly increased, but still below average. Today with loud /a/ pt generated average 89dB. SLP assisted pt in generating more applicable everyday sentences. Pt changed one of these. He produced these sentences with average 83 dB. In short conversational segments of 20 seconds, pt averaged 67 dB with loudness fade noted. SLP confirmed his practice regimen as 10 ah, 10 everyday sentences, and reading out loud for 3 minutes in 30 second intervals.   05/04/24: SLP worked with pt with iPad to incr awareness of lower than WNL voice. Pt used granddad shouting voice to get more intentional speech with louder, WNL  volume speech. SLP collaborated pt with reps of loud /a/ and his everyday sentences; SLP verified pt's everyday sentences would all still be pertinent/relevant - pt to change two (Alexa). SLP told him to pay close attention to things he says until next session and substitute another sentence into his list.  PATIENT EDUCATION: Education details: see treatment date Person educated: Patient Education method: Explanation, Demonstration, Verbal cues, and Handouts Education comprehension: verbalized understanding, returned demonstration, verbal cues required, and needs further education  HOME EXERCISE PROGRAM: Loud /a/ and everyday sentences, along with speech tasks matching ability level   GOALS: Goals reviewed with patient? Yes  SHORT TERM GOALS: Target date:  07/29/24 (first ST not until week of 07/04/24)   Pt will produce loud /a/ with at least low 90s dB average over three sessions  Baseline: Goal status: met  2.  Pt will produce 16/20 sentence responses with 70dB over two sessions  Baseline: 08/15/24 Goal status: not met  3.  Pt will produce volume average 69dB in 3 minutes simple conversation with rare min A over three sessions  Baseline: 08/23/24 Goal status: not met  4.  Pt will generate abdominal breathing 80% of the time when engaging in 3 minutes simple conversation in 2 sessions  Baseline:  Goal status: deferred - not targeted   LONG TERM GOALS: Target date: , 08/26/24  Pt will maintain average 69dB over 8 minute simple-mod complex conversation with rare min A in three sessions  Baseline:  Goal status: ONGOING  2.  Pt will remain 100% intelligible out of the speech room for 8 minute conversation, with rare nonverbal cues Baseline:  Goal status: ONGOING  3.  pt will use abdominal breathing 70% of the time in 8 minutes conversation in three sessions  Baseline:  Goal status: ONGOING  4.  Pt will score higher on CES than initial administration  Baseline:  Goal  status: ONGOING   ASSESSMENT:  CLINICAL IMPRESSION: Patient is a 73 y.o. male who was seen today for treatment of speech clarity who demonstrated dysarthria most c/b low voice volume due to Parkinson's disease.  See treatment date above for today's date for further details on today's session. When asked to produce speech with increased intent during day of eval, he improved volume in 1 minutes of simple conversation however volume decline noted after approx 30 seconds. Pt would benefit from skilled ST targeting loudness in conversation. At this time he does not endorse swallowing difficulty but SLP will monitor informally throughout therapy course and goal/s added PRN. Pt continues to use memory compensations targeted in previous therapy courses.   OBJECTIVE IMPAIRMENTS: Objective impairments include attention, memory, and dysarthria. These impairments are limiting patient from ADLs/IADLs and effectively communicating at home and in community.Factors affecting potential to achieve goals and functional outcome are previous level of function and severity of impairments. Patient will benefit from skilled SLP services to address above impairments and improve overall function.  REHAB POTENTIAL: Good  PLAN:  SLP FREQUENCY: 2x/week  SLP DURATION: 8 weeks  PLANNED INTERVENTIONS: 92507 Treatment of speech (30 or 45 min) , Environmental controls, Cueing hierachy, Internal/external aids, Functional tasks, Multimodal communication approach, SLP instruction and feedback, Compensatory strategies, and Patient/family education    Lupita Connor, MS, SLP 08/25/2024, 3:25 PM

## 2024-08-29 ENCOUNTER — Ambulatory Visit

## 2024-08-29 DIAGNOSIS — R471 Dysarthria and anarthria: Secondary | ICD-10-CM | POA: Diagnosis not present

## 2024-08-29 DIAGNOSIS — R41841 Cognitive communication deficit: Secondary | ICD-10-CM

## 2024-08-29 NOTE — Therapy (Signed)
 OUTPATIENT SPEECH LANGUAGE PATHOLOGY PARKINSON'S TREATMENT/RECERTIFICATION   Patient Name: [name] [name] MRN: [id number] DOB:[birth year], 73 y.o., male Today's Date: 08/29/2024  PCP: Frann Netter, MD REFERRING PROVIDER: Evonnie Stabs, DO  END OF SESSION:  End of Session - 08/29/24 1501     Visit Number 15    Number of Visits 17    Date for Recertification  09/21/24    SLP Start Time 1459   checked in 1457   SLP Stop Time  1530    SLP Time Calculation (min) 31 min    Activity Tolerance Patient tolerated treatment well                  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   Mild neurocognitive disorder due to Parkinson's disease 01/10/2022   Non-Hodgkin lymphoma 2007   diffuse- 6 cycles of chemo with R [facility] Rituxan - last dose 10/08   Nonspecific elevation of levels of transaminase or lactic acid dehydrogenase (LDH) 07/06/2008   Parkinson's disease 07/27/2014   Post-splenectomy 04/02/2015   Past Surgical History:  Procedure Laterality Date   CHOLECYSTECTOMY, LAPAROSCOPIC     EYE SURGERY     x2 as child   PORT-A-CATH REMOVAL     PORTACATH PLACEMENT     SPLENECTOMY     TONSILLECTOMY     Patient Active Problem List   Diagnosis Date Noted   Mild neurocognitive disorder due to Parkinson's disease 01/10/2022   Gastroesophageal reflux disease 03/07/2020   Localized swelling of right lower extremity 04/12/2019   DOE (dyspnea on exertion) 07/15/2018   10 year risk of MI or stroke 7.5% or greater 04/12/2018   Post-splenectomy 04/02/2015   Meige syndrome  (blepharospasm with oromandibular dystonia) 07/27/2014   Dysphagia, neurologic 07/27/2014   Parkinson's disease (HCC) 07/27/2014   Dystonia 07/10/2014   Low back pain 11/21/2011   Nonspecific elevation of levels of transaminase or lactic acid dehydrogenase (LDH) 07/06/2008   Enlarged lymph nodes 07/28/2007   Non-Hodgkin lymphoma 03/23/2007   Speech Therapy Progress Note   Dates of Reporting Period: 08/04/24 to present   Subjective Statement: pt has been seen for 15 ST sessions targeting speech loudness.   Objective: See below. Pt has noticed his wife has not asked him to repeat as much as she was prior to ST, and he is now more aware of softer speech and spontaneously and successfully improves this in conversations.    Goal Update: See below.   Plan: See pt for 2 more sessions   Reason Skilled Services are Required: Pt has not yet maxed rehab potential.    ONSET DATE: Dx in mid-2010's; script dated 04/12/24  REFERRING DIAG:  G20.A1 (ICD-10-CM) - Parkinson's disease without dyskinesia or fluctuating manifestations (HCC)      THERAPY DIAG:  Dysarthria and anarthria  Cognitive communication deficit  Rationale for Evaluation and Treatment: Rehabilitation  SUBJECTIVE:   SUBJECTIVE STATEMENT:  I noticed I was trailing off my sentences and so I repeated when I did that. I was louder the second time.  Pt accompanied by: self  PERTINENT HISTORY: Meige syndrome, insomnia,  RBD. Pt well known to this SLP - previous ST course d/c'd in January 2025 with pt having met all LTGs.   PAIN:  Are you having pain? No  FALLS: Has patient fallen in last 6 months?  No, See PT evaluation for details  PATIENT GOALS: Improve with speech loudness   OBJECTIVE:  Note: Objective measures were completed at Evaluation unless otherwise noted.  DIAGNOSTIC FINDINGS:  From neuropsych evaluation report dated April 2023:  Mr. Ealy completed a comprehensive neuropsychological evaluation on  01/10/2022. Please refer to that encounter for the full report and recommendations. Briefly, results suggested isolated impaired performances across complex attention and semantic fluency. Performance variability was also exhibited across all aspects of learning and memory. Regarding etiology, the most likely culprit for ongoing cognitive weakness remains his past history of Parkinson's disease. While processing speed was improved relative to what might be expected, dysfunction surrounding complex attention and encoding/retrieval deficits of memory are quite common in this illness. The fact that motor/gait dysfunction was noted years prior to concern surrounding cognitive decline is also a typical timeline associated with Parkinson's disease. Mild anxiety and sleep dysfunction could further influence cognitive performances. Research surrounding cognitive side effects of pramipexole  is mixed; however, I cannot rule out an ongoing side effect contribution as well.   PATIENT REPORTED OUTCOME MEASURES (PROM): Communication Effectiveness Survey: pt scored himself 13/32, with lower scores indicating more challenges communicating given pt's deficits. 1 (not at all effective) was scored for a conversation in a noisy environment, speaking when emotionally upset/angry, a conversation in a car, and a conversation at a distance.Other responses were 2.  TODAY'S TREATMENT:                                                                                                                                         DATE:   08/29/24: SLP targeted improved speech loudness with using intent and purpose. Loud /a/ was produced today with average 92dB. M-words average 88dB, and sentences averaged 80dB. Conversation for 15 minutes after this was average 69db with rare min A increased to occasional min A for maintaining loudness. In 15 minutes of conversational speech pt self corrected 2 times (repeated an utterance with louder  speech), and when he had 20 seconds of softer speech, used his external cue (rubber bracelet) to improve his speech volume.   08/25/24: First 10 minutes of therapy was in conversation with pt - average 68dB with rare min A- pt was 100% intelligible. Loud /a/ was produced today with average 90dB. Conversation for 15 minutes after this was average 69db with occasional min A increased to occasional mod A for maintaining loudness. SLP suggested pt have external cue for loudness during the day, and that he choose 30  minutes x3/day to focus on using intentional and purposeful speech.  08/23/24:SLP assessed pt's progress in conversation - first 14 minutes SLP provided occasional min cues  for loudness. After this SLP had to increase frequency and intensity of cueing to usual min-mod A for loudness until 23 minutes conversation total. SLP then talked with/educated pt about being more succinct with his conversation initially in order to maximize length of talking time he will have for conversations >10 minutes.  Pt stated he will practice m-words, /a/, and sentences once today, tomorrow, and once Thursday prior to his appointment.   08/15/24: Pt warmed up with Speak Out m-words, average 88dB, loud /a/ average 92dB. Read his everyday sentences with average 83dB. 2 minute conversational segments: pt req'd usual min-mod A for maintaining loudness faded to occasional min A- when SLP cued pt he improved volume to average 69dB.   08/11/24:  Targeted volume and intelligibility using Speak Out! Lesson 9. Pt required occasional mod verbal cues, modeling, for volume and breath support.   Pt averages the following volume levels:  Sustained AH: ~90 dB  Counting:79 dB  Reading (phrases): 78 dB           Simple conversation (4 minute segments): 68 dB Required occasional  verbal cues, modeling for carryover of intent /volume answering simple questions following structured practice. During short conversation pt currently  averages between 67-68 dB without cueing. With mod to min cueing, pt loudness during conversation averages 70 dB. SLP will focus on integrating intent for speech at conversation level to optimize pt speech production for ADLs.   08/04/24: Vickie isn't asking me to repeat as frequently. Pt practiced /a/ and sentences BID since last session. Loud /a/ average 92dB, everyday sentences averaged 83dB. In sentence responses pt produced responses with average volume 67dB but improved to 68dB average with SLP request for repeat. In sentence level responses pt maintained WNL volume 85% with some vocal tightness evident. In simple conversation he used WNL volume 70% of the time and when SLP cued him to speak out and use your best voice pt improved to 85% of the time.  08/02/24: Pt loud /a/ and sentences practiced each day since last session. Pt has seen three examples of louder speech with his son, during Sunday school, and the [facility] [facility].  Loud /a/ average 93dB, everyday sentences averaged 85dB. In sentence responses pt produced responses with average volume 67dB but improved to 68dB average with SLP request for repeat. SLP assisted pt with sentence responses with louder speech - average 69dB. SLP continued practice in conversation, with pt's average 68dB for the first 5 minutes and after this req'd mod cues for loudness, occasionally. SLP suspects this due to fatigue.    07/28/24: Pt practiced loud /a/ and everyday sentences x3 since last session - the expected amount.  Loud /a/ average 91dB, everyday sentences averaged 83dB. In sentence responses pt produced responses with average volume 67dB but improved to 68dB average with SLP request for repeat. Pt had difficulty today with trailing off from beginning to sentence to end of sentence. In conversation, limited/no carryover to conversational speech, even after practicing for the session.   07/26/24: Pt arrived with volume 67dB faded to 66dB after 2  minutes of conversation. SLP provided rare mod cues for improved volume but limited response from pt. Loud /a/ average 90dB, sentences averaged 84dB. In sentence/ responses pt maintained average 69dB - and stated S above.Pt shared he had not practiced much since last session due to computer issues of his wife which brought son and his family over to his house many of the last 4 days. SLP encouraged him to maintain  frequency with HEP.   07/20/24: Pt arrived with volume 67dB in conversation. SLP with occasional mod cues for improved volume but limited response from pt. SLP guided pt through loud /a/ and everyday sentences and pt's conversational speech after this was WNL volume. SLP directed pt with sentence level responses with pt's volume average 70dB with occasional min A for improved volume and intent. After 7 mintues of task SLP req'd to provide usual min-mod A. SLP provided homework for pt to complete 15 minutes daily for incr'd loudness/intent with speech.  07/13/24: SLP collaborated with pt to assist him in real-time feedback for louder speech in 60-second conversational segments. Pt req'd mod-max A usually for louder speech, average 66dB. Pt told SLP that if an emotional topic he will get softer consistently. After loud /a/ average 92dB, pt's loudness increased to 69dB over the next 3 mintues. After this loudness decreased again to average 67dB. SLP told pt he needed to practice each day and used this example to verify when pt practices his voice is louder. Everyday sentences were average 83dB.  07/11/24: SLP provided pt with PROM with instructions to fill out as if it were day after evaluation. Scored as above.  Loud /a/ average generated average 90dB. Pt produced everyday sentences with average 81dB. With sentence responses pt generated average 69dB with rare min A for loudness and intent. In short conversational segments of 20-30 seconds, pt averaged 69dB with occasional mod A.   07/06/24: PT  NEEDS PROM NEXT SESSION. Today with loud /a/ pt generated average 91dB. Pt produced everyday sentences with average 81 dB. With sentence responses pt generated average In short conversational segments of 20 seconds, pt averaged 67 dB with loudness fade noted. SLP asked pt to practice with loud /a/, everyday sentences, and sentence responses.  07/04/24: Pt needs PROM next session. Pt had re-eval today, as this is day 61 since evaluation. See objective voice assessment above, for changes. In general, pt's volume has slightly increased, but still below average. Today with loud /a/ pt generated average 89dB. SLP assisted pt in generating more applicable everyday sentences. Pt changed one of these. He produced these sentences with average 83 dB. In short conversational segments of 20 seconds, pt averaged 67 dB with loudness fade noted. SLP confirmed his practice regimen as 10 ah, 10 everyday sentences, and reading out loud for 3 minutes in 30 second intervals.   05/04/24: SLP worked with pt with iPad to incr awareness of lower than WNL voice. Pt used granddad shouting voice to get more intentional speech with louder, WNL volume speech. SLP collaborated pt with reps of loud /a/ and his everyday sentences; SLP verified pt's everyday sentences would all still be pertinent/relevant - pt to change two (Alexa). SLP told him to pay close attention to things he says until next session and substitute another sentence into his list.  PATIENT EDUCATION: Education details: see treatment date Person educated: Patient Education method: Explanation, Demonstration, Verbal cues, and Handouts Education comprehension: verbalized understanding, returned demonstration, verbal cues required, and needs further education  HOME EXERCISE PROGRAM: Loud /a/ and everyday sentences, along with speech tasks matching ability level   GOALS: Goals reviewed with patient? Yes  SHORT TERM GOALS: Target date:  07/29/24 (first ST not  until week of 07/04/24)   Pt will produce loud /a/ with at least low 90s dB average over three sessions  Baseline: Goal status: met  2.  Pt will produce 16/20 sentence responses with 70dB over two sessions  Baseline: 08/15/24 Goal status: not met  3.  Pt will produce volume average 69dB in 3 minutes simple conversation with rare min A over three sessions  Baseline: 08/23/24 Goal status: not met  4.  Pt will generate abdominal breathing 80% of the time when engaging in 3 minutes simple conversation in 2 sessions  Baseline:  Goal status: deferred - not targeted   LONG TERM GOALS: Target date: ,  09/21/24  Pt will maintain average 69dB over 8 minute simple-mod complex conversation with rare min A in three sessions  Baseline: 08/29/24 Goal status: partially met and cont  2.  Pt will remain 100% intelligible out of the speech room for 8 minute conversation, with rare nonverbal cues Baseline:  Goal status: not met and cont  3.  pt will use abdominal breathing 70% of the time in 8 minutes conversation in three sessions  Baseline:  Goal status: not met and cont  4.  Pt will score higher on CES than initial administration  Baseline:  Goal status: not met - will address in final session   ASSESSMENT:  CLINICAL IMPRESSION: RECERT TODAY. Patient is a 73 y.o. male who was seen today for treatment of speech clarity who demonstrated dysarthria most c/b low voice volume due to Parkinson's disease. See treatment date above for today's date for further details on today's session. When asked to produce speech with increased intent during day of eval, he improved volume in 1 minutes of simple conversation however volume decline noted after approx 30 seconds. Pt would benefit from skilled ST targeting loudness in conversation. At this time he does not endorse swallowing difficulty but SLP will monitor informally throughout therapy course and goal/s added PRN. Pt continues to use memory  compensations targeted in previous therapy courses.   OBJECTIVE IMPAIRMENTS: Objective impairments include attention, memory, and dysarthria. These impairments are limiting patient from ADLs/IADLs and effectively communicating at home and in community.Factors affecting potential to achieve goals and functional outcome are previous level of function and severity of impairments. Patient will benefit from skilled SLP services to address above impairments and improve overall function.  REHAB POTENTIAL: Good  PLAN:  SLP FREQUENCY: 2x/week  SLP DURATION: 8 weeks  PLANNED INTERVENTIONS: 92507 Treatment of speech (30 or 45 min) , Environmental controls, Cueing hierachy, Internal/external aids, Functional tasks, Multimodal communication approach, SLP instruction and feedback, Compensatory strategies, and Patient/family education    Lupita Connor, MS, SLP 08/29/2024, 3:02 PM

## 2024-08-31 ENCOUNTER — Ambulatory Visit

## 2024-08-31 DIAGNOSIS — R471 Dysarthria and anarthria: Secondary | ICD-10-CM

## 2024-08-31 DIAGNOSIS — R41841 Cognitive communication deficit: Secondary | ICD-10-CM

## 2024-08-31 NOTE — Therapy (Signed)
 OUTPATIENT SPEECH LANGUAGE PATHOLOGY PARKINSON'S TREATMENT/RECERTIFICATION   Patient Name: [name] [name] MRN: [id number] DOB:October 25, 1950, 73 y.o., male Today's Date: 08/31/2024  PCP: Frann Netter, MD REFERRING PROVIDER: Evonnie Stabs, DO  END OF SESSION:  End of Session - 08/31/24 1722     Visit Number 16    Number of Visits 17    Date for Recertification  09/21/24    SLP Start Time 1625   arrived 1623   SLP Stop Time  1702    SLP Time Calculation (min) 37 min    Activity Tolerance Patient tolerated treatment well                   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   Mild neurocognitive disorder due to Parkinson's disease 01/10/2022   Non-Hodgkin lymphoma 2007   diffuse- 6 cycles of chemo with R [facility] Rituxan - last dose 10/08   Nonspecific elevation of levels of transaminase or lactic acid dehydrogenase (LDH) 07/06/2008   Parkinson's disease 07/27/2014   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/2015   Past Surgical History:  Procedure Laterality Date   CHOLECYSTECTOMY, LAPAROSCOPIC     EYE SURGERY     x2 as child   PORT-A-CATH REMOVAL     PORTACATH PLACEMENT     SPLENECTOMY     TONSILLECTOMY     Patient Active Problem List   Diagnosis Date Noted   Mild neurocognitive disorder due to Parkinson's disease 01/10/2022   Gastroesophageal reflux disease 03/07/2020   Localized swelling of right lower extremity 04/12/2019   DOE (dyspnea on exertion) 07/15/2018   10 year risk of MI or stroke 7.5% or greater 04/12/2018   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/02/2015   Meige syndrome  (blepharospasm with oromandibular dystonia) 07/27/2014   Dysphagia, neurologic 07/27/2014   Parkinson's disease (HCC) 07/27/2014   Dystonia 07/10/2014   Low back pain 11/21/2011   Nonspecific elevation of levels of transaminase or lactic acid dehydrogenase (LDH) 07/06/2008   Enlarged lymph nodes 07/28/2007   Non-Hodgkin lymphoma 03/23/2007     ONSET DATE: Dx in mid-2010's; script dated 04/12/24  REFERRING DIAG:  G20.A1 (ICD-10-CM) - Parkinson's disease without dyskinesia or fluctuating manifestations (HCC)      THERAPY DIAG:  Dysarthria and anarthria  Cognitive communication deficit  Rationale for Evaluation and Treatment: Rehabilitation  SUBJECTIVE:   SUBJECTIVE STATEMENT:  I made a call yesterday to an old friend. It went really well.  Pt accompanied by: self  PERTINENT HISTORY: Meige syndrome, insomnia, RBD. Pt well known to this SLP - previous ST course d/c'd in January 2025 with pt having met all LTGs.   PAIN:  Are you having pain? No  FALLS: Has patient fallen in last 6 months?  No, See PT evaluation for details  PATIENT GOALS: Improve with speech loudness   OBJECTIVE:  Note: Objective measures were completed at Evaluation unless otherwise noted.  DIAGNOSTIC FINDINGS:  From neuropsych evaluation report dated April 2023:  Mr. Baldus completed a comprehensive neuropsychological evaluation on 01/10/2022. Please refer to that encounter for the full report and recommendations. Briefly, results suggested isolated impaired performances across complex attention and  semantic fluency. Performance variability was also exhibited across all aspects of learning and memory. Regarding etiology, the most likely culprit for ongoing cognitive weakness remains his past history of Parkinson's disease. While processing speed was improved relative to what might be expected, dysfunction surrounding complex attention and encoding/retrieval deficits of memory are quite common in this  illness. The fact that motor/gait dysfunction was noted years prior to concern surrounding cognitive decline is also a typical timeline associated with Parkinson's disease. Mild anxiety and sleep dysfunction could further influence cognitive performances. Research surrounding cognitive side effects of pramipexole  is mixed; however, I cannot rule out an ongoing side effect contribution as well.   PATIENT REPORTED OUTCOME MEASURES (PROM): Communication Effectiveness Survey: pt scored himself 13/32, with lower scores indicating more challenges communicating given pt's deficits. 1 (not at all effective) was scored for a conversation in a noisy environment, speaking when emotionally upset/angry, a conversation in a car, and a conversation at a distance.Other responses were 2.  TODAY'S TREATMENT:                                                                                                                                         DATE:   08/31/24: M-words average 90B, loud /a/ with average 92dB. Everyday sentences averaged 81dB. Conversation after these tasks averaged 69dB with occasional min cues for the first 7 minutes. After this loudness decr'd to 67dB with occasional min-mod cues for loudness. SLP strongly encouraged pt to think of intent and effort needed with M-words and /a/, and numbers. Pt benefited from cues for thinking aobut the intent and purpose in the tasks performed before.  08/29/24: SLP targeted improved speech loudness with using intent and purpose. Loud /a/ was produced today with average 92dB. M-words average 88dB, and sentences averaged 80dB. Conversation for 15 minutes after this was average 69db with rare min A increased to occasional min A for maintaining loudness. In 15 minutes of conversational speech pt self corrected 2 times (repeated an utterance with louder speech), and when he had 20 seconds of softer speech, used his external cue (rubber bracelet) to improve his speech  volume.   08/25/24: First 10 minutes of therapy was in conversation with pt - average 68dB with rare min A- pt was 100% intelligible. Loud /a/ was produced today with average 90dB. Conversation for 15 minutes after this was average 69db with occasional min A increased to occasional mod A for maintaining loudness. SLP suggested pt have external cue for loudness during the day, and that he choose 30  minutes x3/day to focus on using intentional and purposeful speech.  08/23/24:SLP assessed pt's progress in conversation - first 14 minutes SLP provided occasional min cues for loudness. After this SLP had to increase frequency and intensity of cueing to usual min-mod A for loudness until 23 minutes conversation total. SLP then talked with/educated pt about being more succinct with his  conversation initially in order to maximize length of talking time he will have for conversations >10 minutes.  Pt stated he will practice m-words, /a/, and sentences once today, tomorrow, and once Thursday prior to his appointment.   08/15/24: Pt warmed up with Speak Out m-words, average 88dB, loud /a/ average 92dB. Read his everyday sentences with average 83dB. 2 minute conversational segments: pt req'd usual min-mod A for maintaining loudness faded to occasional min A- when SLP cued pt he improved volume to average 69dB.   08/11/24:  Targeted volume and intelligibility using Speak Out! Lesson 9. Pt required occasional mod verbal cues, modeling, for volume and breath support.   Pt averages the following volume levels:  Sustained AH: ~90 dB  Counting:79 dB  Reading (phrases): 78 dB           Simple conversation (4 minute segments): 68 dB Required occasional  verbal cues, modeling for carryover of intent /volume answering simple questions following structured practice. During short conversation pt currently averages between 67-68 dB without cueing. With mod to min cueing, pt loudness during conversation averages 70 dB. SLP  will focus on integrating intent for speech at conversation level to optimize pt speech production for ADLs.   08/04/24: Vickie isn't asking me to repeat as frequently. Pt practiced /a/ and sentences BID since last session. Loud /a/ average 92dB, everyday sentences averaged 83dB. In sentence responses pt produced responses with average volume 67dB but improved to 68dB average with SLP request for repeat. In sentence level responses pt maintained WNL volume 85% with some vocal tightness evident. In simple conversation he used WNL volume 70% of the time and when SLP cued him to speak out and use your best voice pt improved to 85% of the time.  08/02/24: Pt loud /a/ and sentences practiced each day since last session. Pt has seen three examples of louder speech with his son, during Sunday school, and the [facility] [facility].  Loud /a/ average 93dB, everyday sentences averaged 85dB. In sentence responses pt produced responses with average volume 67dB but improved to 68dB average with SLP request for repeat. SLP assisted pt with sentence responses with louder speech - average 69dB. SLP continued practice in conversation, with pt's average 68dB for the first 5 minutes and after this req'd mod cues for loudness, occasionally. SLP suspects this due to fatigue.    07/28/24: Pt practiced loud /a/ and everyday sentences x3 since last session - the expected amount.  Loud /a/ average 91dB, everyday sentences averaged 83dB. In sentence responses pt produced responses with average volume 67dB but improved to 68dB average with SLP request for repeat. Pt had difficulty today with trailing off from beginning to sentence to end of sentence. In conversation, limited/no carryover to conversational speech, even after practicing for the session.   07/26/24: Pt arrived with volume 67dB faded to 66dB after 2 minutes of conversation. SLP provided rare mod cues for improved volume but limited response from pt. Loud /a/ average  90dB, sentences averaged 84dB. In sentence/ responses pt maintained average 69dB - and stated S above.Pt shared he had not practiced much since last session due to computer issues of his wife which brought son and his family over to his house many of the last 4 days. SLP encouraged him to maintain frequency with HEP.   07/20/24: Pt arrived with volume 67dB in conversation. SLP with occasional mod cues for improved volume but limited response from pt. SLP guided pt through loud /a/ and everyday sentences  and pt's conversational speech after this was WNL volume. SLP directed pt with sentence level responses with pt's volume average 70dB with occasional min A for improved volume and intent. After 7 mintues of task SLP req'd to provide usual min-mod A. SLP provided homework for pt to complete 15 minutes daily for incr'd loudness/intent with speech.  07/13/24: SLP collaborated with pt to assist him in real-time feedback for louder speech in 60-second conversational segments. Pt req'd mod-max A usually for louder speech, average 66dB. Pt told SLP that if an emotional topic he will get softer consistently. After loud /a/ average 92dB, pt's loudness increased to 69dB over the next 3 mintues. After this loudness decreased again to average 67dB. SLP told pt he needed to practice each day and used this example to verify when pt practices his voice is louder. Everyday sentences were average 83dB.  07/11/24: SLP provided pt with PROM with instructions to fill out as if it were day after evaluation. Scored as above.  Loud /a/ average generated average 90dB. Pt produced everyday sentences with average 81dB. With sentence responses pt generated average 69dB with rare min A for loudness and intent. In short conversational segments of 20-30 seconds, pt averaged 69dB with occasional mod A.   07/06/24: PT NEEDS PROM NEXT SESSION. Today with loud /a/ pt generated average 91dB. Pt produced everyday sentences with average 81  dB. With sentence responses pt generated average In short conversational segments of 20 seconds, pt averaged 67 dB with loudness fade noted. SLP asked pt to practice with loud /a/, everyday sentences, and sentence responses.  07/04/24: Pt needs PROM next session. Pt had re-eval today, as this is day 61 since evaluation. See objective voice assessment above, for changes. In general, pt's volume has slightly increased, but still below average. Today with loud /a/ pt generated average 89dB. SLP assisted pt in generating more applicable everyday sentences. Pt changed one of these. He produced these sentences with average 83 dB. In short conversational segments of 20 seconds, pt averaged 67 dB with loudness fade noted. SLP confirmed his practice regimen as 10 ah, 10 everyday sentences, and reading out loud for 3 minutes in 30 second intervals.   05/04/24: SLP worked with pt with iPad to incr awareness of lower than WNL voice. Pt used granddad shouting voice to get more intentional speech with louder, WNL volume speech. SLP collaborated pt with reps of loud /a/ and his everyday sentences; SLP verified pt's everyday sentences would all still be pertinent/relevant - pt to change two (Alexa). SLP told him to pay close attention to things he says until next session and substitute another sentence into his list.  PATIENT EDUCATION: Education details: see treatment date Person educated: Patient Education method: Explanation, Demonstration, Verbal cues, and Handouts Education comprehension: verbalized understanding, returned demonstration, verbal cues required, and needs further education  HOME EXERCISE PROGRAM: Loud /a/ and everyday sentences, along with speech tasks matching ability level   GOALS: Goals reviewed with patient? Yes  SHORT TERM GOALS: Target date:  07/29/24 (first ST not until week of 07/04/24)   Pt will produce loud /a/ with at least low 90s dB average over three sessions   Baseline: Goal status: met  2.  Pt will produce 16/20 sentence responses with 70dB over two sessions  Baseline: 08/15/24 Goal status: not met  3.  Pt will produce volume average 69dB in 3 minutes simple conversation with rare min A over three sessions  Baseline: 08/23/24 Goal status: not met  4.  Pt will generate abdominal breathing 80% of the time when engaging in 3 minutes simple conversation in 2 sessions  Baseline:  Goal status: deferred - not targeted   LONG TERM GOALS: Target date: ,  09/21/24  Pt will maintain average 69dB over 8 minute simple-mod complex conversation with rare min A in three sessions  Baseline: 08/29/24 Goal status: cont  2.  Pt will remain 100% intelligible out of the speech room for 8 minute conversation, with rare nonverbal cues Baseline:  Goal status: cont  3.  pt will use abdominal breathing 70% of the time in 8 minutes conversation in three sessions  Baseline:  Goal status: cont  4.  Pt will score higher on CES than initial administration  Baseline:  Goal status: not met - will address in final session   ASSESSMENT:  CLINICAL IMPRESSION: Patient is a 73 y.o. male who was seen today for treatment of speech clarity who demonstrated dysarthria most c/b low voice volume due to Parkinson's disease. See treatment date above for today's date for further details on today's session. When asked to produce speech with increased intent during day of eval, he improved volume in 1 minutes of simple conversation however volume decline noted after approx 30 seconds. Pt would benefit from skilled ST targeting loudness in conversation. At this time he does not endorse swallowing difficulty but SLP will monitor informally throughout therapy course and goal/s added PRN. Pt continues to use memory compensations targeted in previous therapy courses.   OBJECTIVE IMPAIRMENTS: Objective impairments include attention, memory, and dysarthria. These impairments are  limiting patient from ADLs/IADLs and effectively communicating at home and in community.Factors affecting potential to achieve goals and functional outcome are previous level of function and severity of impairments. Patient will benefit from skilled SLP services to address above impairments and improve overall function.  REHAB POTENTIAL: Good  PLAN:  SLP FREQUENCY: 2x/week  SLP DURATION: 8 weeks  PLANNED INTERVENTIONS: 92507 Treatment of speech (30 or 45 min) , Environmental controls, Cueing hierachy, Internal/external aids, Functional tasks, Multimodal communication approach, SLP instruction and feedback, Compensatory strategies, and Patient/family education    Lupita Connor, MS, SLP 08/31/2024, 5:24 PM

## 2024-09-01 ENCOUNTER — Ambulatory Visit (INDEPENDENT_AMBULATORY_CARE_PROVIDER_SITE_OTHER): Payer: Medicare Other

## 2024-09-01 VITALS — BP 120/74 | HR 79 | Temp 97.3°F | Ht 65.0 in | Wt 194.8 lb

## 2024-09-01 DIAGNOSIS — Z0000 Encounter for general adult medical examination without abnormal findings: Secondary | ICD-10-CM | POA: Diagnosis not present

## 2024-09-01 NOTE — Progress Notes (Signed)
 Chief Complaint  Patient presents with   Medicare Wellness     Subjective:   [name] [name] is a 73 y.o. male who presents for a Medicare Annual Wellness Visit.  Visit info / Clinical Intake: Medicare Wellness Visit Type:: Subsequent Annual Wellness Visit Persons participating in visit and providing information:: patient Medicare Wellness Visit Mode:: In-person (required for WTM) Interpreter Needed?: No Pre-visit prep was completed: yes AWV questionnaire completed by patient prior to visit?: yes Date:: 08/29/24 Living arrangements:: lives with spouse/significant other Patient's Overall Health Status Rating: good Typical amount of pain: some Does pain affect daily life?: no Are you currently prescribed opioids?: no  Dietary Habits and Nutritional Risks How many meals a day?: 2 Eats fruit and vegetables daily?: yes Most meals are obtained by: preparing own meals; having others provide food In the last 2 weeks, have you had any of the following?: none Diabetic:: no  Functional Status Activities of Daily Living (to include ambulation/medication): Independent Ambulation: Independent with device- listed below Home Assistive Devices/Equipment: [place]; Eyeglasses Medication Administration: Independent Home Management (perform basic housework or laundry): Independent Manage your own finances?: yes Primary transportation is: driving Concerns about vision?: no *vision screening is required for WTM* Concerns about hearing?: no  Fall Screening Falls in the past year?: 1 Number of falls in past year: 0 Was there an injury with Fall?: 0 Fall Risk Category Calculator: 1 Patient Fall Risk Level: Low Fall Risk  Fall Risk Patient at Risk for Falls Due to: Impaired balance/gait Fall risk Follow up: Falls evaluation completed  Home and Transportation Safety: All rugs have non-skid backing?: yes All stairs or steps have railings?: yes Grab bars in the bathtub or shower?: yes Have  non-skid surface in bathtub or shower?: yes Good home lighting?: yes Regular seat belt use?: yes Hospital stays in the last year:: no  Cognitive Assessment Difficulty concentrating, remembering, or making decisions? : yes Will 6CIT or Mini Cog be Completed: yes What year is it?: 0 points What month is it?: 0 points Give patient an address phrase to remember (5 components): [address] [place]  About what time is it?: 0 points Count backwards from 20 to 1: 0 points Say the months of the year in reverse: 0 points Repeat the address phrase from earlier: 0 points 6 CIT Score: 0 points  Advance Directives (For Healthcare) Does Patient Have a Medical Advance Directive?: Yes Does patient want to make changes to medical advance directive?: No - Patient declined Type of Advance Directive: Healthcare Power of [place] Vera; Living will Copy of Healthcare Power of Attorney in Chart?: No - copy requested Copy of Living Will in Chart?: No - copy requested  Reviewed/Updated  Reviewed/Updated: Reviewed All (Medical, Surgical, Family, Medications, Allergies, Care Teams, Patient Goals)    Allergies (verified) Patient has no known allergies.   Current Medications (verified) Outpatient Encounter Medications as of 09/01/2024  Medication Sig   aspirin 81 MG chewable tablet Chew 81 mg by mouth daily.   carbidopa -levodopa  (SINEMET  CR) 50-200 MG tablet TAKE 1 TABLET BY MOUTH EVERYDAY AT BEDTIME   carbidopa -levodopa  (SINEMET  IR) 25-100 MG tablet Take 1.5 tablet at 7 AM/11 AM/ and 1 tablet at 3 PM/7 PM   Cholecalciferol (VITAMIN D3) 3000 UNITS TABS Take 1,000 Units by mouth daily.    clonazePAM  (KLONOPIN ) 0.5 MG tablet Take 1 tablet (0.5 mg total) by mouth at bedtime.   cyanocobalamin 100 MCG tablet Take 1,000 mcg by mouth daily.    famotidine (PEPCID) 10  MG tablet Take 10 mg by mouth 2 (two) times daily.   Melatonin 3 MG TABS Take 3 mg by mouth at bedtime.    mirtazapine  (REMERON ) 15 MG tablet  Take 1 tablet (15 mg total) by mouth at bedtime.   pramipexole  (MIRAPEX ) 0.75 MG tablet Take 1 tablet (0.75 mg total) by mouth 3 (three) times daily.   trimethoprim -polymyxin b  (POLYTRIM ) ophthalmic solution Place 1 drop into the right eye every 4 (four) hours.   Trospium  Chloride 60 MG CP24 Take 1 capsule (60 mg total) by mouth daily.   No facility-administered encounter medications on file as of 09/01/2024.    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   Mild neurocognitive disorder due to Parkinson's disease 01/10/2022   Non-Hodgkin lymphoma 2007   diffuse- 6 cycles of chemo with R [facility] Rituxan - last dose 10/08   Nonspecific elevation of levels of transaminase or lactic acid dehydrogenase (LDH) 07/06/2008   Parkinson's disease 07/27/2014   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   Past Surgical History:  Procedure Laterality Date   CHOLECYSTECTOMY, LAPAROSCOPIC     EYE SURGERY     x2 as child   PORT-A-CATH REMOVAL     PORTACATH PLACEMENT     SPLENECTOMY     TONSILLECTOMY     Family History  Problem Relation Age of Onset   Heart disease Mother    Cancer Mother        lung   Heart disease Father    Social History   Occupational History   Occupation: Retired    Comment: IT/former CIO  Tobacco Use   Smoking status: Never   Smokeless tobacco: Never  Vaping Use   Vaping status: Never Used  Substance and Sexual Activity   Alcohol use: No    Alcohol/week: 0.0 standard drinks of alcohol   Drug use: No   Sexual activity: Not on file   Tobacco Counseling Counseling given:  No  SDOH Screenings   Food Insecurity: No Food Insecurity (09/01/2024)  Housing: High Risk (09/01/2024)  Transportation Needs: No Transportation Needs (09/01/2024)  Utilities: Not At Risk (09/01/2024)  Alcohol Screen: Low Risk (08/27/2023)  Depression (PHQ2-9): Low Risk (09/01/2024)  Financial Resource Strain: Low Risk (08/29/2024)  Physical Activity: Inactive (09/01/2024)  Social Connections: Socially Integrated (09/01/2024)  Stress: No Stress Concern Present (09/01/2024)  Tobacco Use: Low Risk (09/01/2024)  Health Literacy: Adequate Health Literacy (09/01/2024)   See flowsheets for full screening details  Depression Screen PHQ 2 & 9 Depression Scale- Over the past 2 weeks, how often have you been bothered by any of the following problems? Little interest or pleasure in doing things: 0 Feeling down, depressed, or hopeless (PHQ Adolescent also includes...irritable): 0 PHQ-2 Total Score: 0 Trouble falling or staying asleep, or sleeping too much: 0 Feeling tired or having little energy: 0 Poor appetite or overeating (PHQ Adolescent also includes...weight loss): 0 Feeling bad about yourself - or that you are a failure or have let yourself or your family down: 0 Trouble concentrating on things, such as reading the newspaper or watching television (PHQ Adolescent also includes...like school work):  0 Moving or speaking so slowly that other people could have noticed. Or the opposite - being so fidgety or restless that you have been moving around a lot more than usual: 0 Thoughts that you would be better off dead, or of hurting yourself in some way: 0 PHQ-9 Total Score: 0 If you checked off any problems, how difficult have these problems made it for you to do your work, take care of things at home, or get along with other people?: Not difficult at all     Goals Addressed               This Visit's Progress     Continue physical activity (pt-stated)        Remain active.              Objective:    Today's Vitals   09/01/24 1326  BP: 120/74  Pulse: 79  Temp: (!) 97.3 F (36.3 C)  TempSrc: Oral  SpO2: 96%  Weight: 194 lb 12.8 oz (88.4 kg)  Height: 5' 5 (1.651 m)   Body mass index is 32.42 kg/m.  Hearing/Vision screen Hearing Screening - Comments:: Denies hearing difficulties   Vision Screening - Comments:: Wears rx glasses - up to date with routine eye exams with  [employer] Immunizations and Health Maintenance Health Maintenance  Topic Date Due   Zoster Vaccines- Shingrix (1 of 2) Never done   COVID-19 Vaccine (5 - 2025-26 season) 05/23/2024   Colonoscopy  06/04/2025   Medicare Annual Wellness (AWV)  09/01/2025   DTaP/Tdap/Td (3 - Tdap) 06/20/2026   Pneumococcal Vaccine: 50+ Years  Completed   Influenza Vaccine  Completed   Hepatitis C Screening  Completed   Meningococcal B Vaccine  Aged Out        Assessment/Plan:  This is a routine wellness examination for [facility].  Patient Care Team: Frann Mabel Mt, DO as PCP - General (Family Medicine) Tat, Asberry RAMAN, DO as Consulting Physician (Neurology)  I have personally reviewed and noted the following in the patients chart:   Medical and social history Use of alcohol, tobacco or illicit drugs  Current medications and supplements including opioid prescriptions. Functional ability and status Nutritional status Physical activity Advanced directives List of other physicians Hospitalizations, surgeries, and ER visits in previous 12 months Vitals Screenings to include cognitive, depression, and falls Referrals and appointments  No orders of the defined types were placed in this encounter.  In addition, I have reviewed and discussed with patient certain preventive protocols, quality metrics, and best practice recommendations. A written personalized care plan for preventive services as well as general preventive health recommendations were provided to patient.   Rojelio LELON Blush,  LPN   [id number]   Return in 53 weeks (on 09/07/2025).  After Visit Summary: (In Person-Declined) Patient declined AVS at this time.  Nurse Notes: No voiced or noted concerns at this time

## 2024-09-01 NOTE — Patient Instructions (Addendum)
 [name] [name],  Thank you for taking the time for your Medicare Wellness Visit. I appreciate your continued commitment to your health goals. Please review the care plan we discussed, and feel free to reach out if I can assist you further.  Please note that Annual Wellness Visits do not include a physical exam. Some assessments may be limited, especially if the visit was conducted virtually. If needed, we may recommend an in-person follow-up with your provider.  Ongoing Care Seeing your primary care provider every 3 to 6 months helps us  monitor your health and provide consistent, personalized care.   Referrals If a referral was made during today's visit and you haven't received any updates within two weeks, please contact the referred provider directly to check on the status.  Recommended Screenings:  Health Maintenance  Topic Date Due   Zoster (Shingles) Vaccine (1 of 2) Never done   COVID-19 Vaccine (5 - 2025-26 season) 05/23/2024   Colon Cancer Screening  06/04/2025   Medicare Annual Wellness Visit  09/01/2025   DTaP/Tdap/Td vaccine (3 - Tdap) 06/20/2026   Pneumococcal Vaccine for age over 27  Completed   Flu Shot  Completed   Hepatitis C Screening  Completed   Meningitis B Vaccine  Aged Out       09/01/2024    1:34 PM  Advanced Directives  Does Patient Have a Medical Advance Directive? Yes  Type of Estate Agent of [place];Living will  Does patient want to make changes to medical advance directive? No - Patient declined  Copy of Healthcare Power of Attorney in Chart? No - copy requested    Vision: Annual vision screenings are recommended for early detection of glaucoma, cataracts, and diabetic retinopathy. These exams can also reveal signs of chronic conditions such as diabetes and high blood pressure.  Dental: Annual dental screenings help detect early signs of oral cancer, gum disease, and other conditions linked to overall health, including heart  disease and diabetes.  Please see the attached documents for additional preventive care recommendations.

## 2024-09-05 ENCOUNTER — Encounter: Payer: Self-pay | Admitting: Family Medicine

## 2024-09-08 ENCOUNTER — Ambulatory Visit

## 2024-09-08 DIAGNOSIS — R471 Dysarthria and anarthria: Secondary | ICD-10-CM

## 2024-09-08 DIAGNOSIS — R41841 Cognitive communication deficit: Secondary | ICD-10-CM

## 2024-09-08 NOTE — Therapy (Addendum)
 OUTPATIENT SPEECH LANGUAGE PATHOLOGY PARKINSON'S TREATMENT/RECERTIFICATION   Patient Name: [name] [name] MRN: [id number] DOB:Jul 11, 1951, 73 y.o., male Today's Date: 09/08/2024  PCP: [name] Netter, MD REFERRING PROVIDER: Evonnie Stabs, DO  END OF SESSION:  End of Session - 09/08/24 1455     Visit Number 17    Number of Visits 17    Date for Recertification  09/21/24    SLP Start Time 1449    SLP Stop Time  1530    SLP Time Calculation (min) 41 min    Activity Tolerance Patient tolerated treatment well                   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   Mild neurocognitive disorder due to Parkinson's disease 01/10/2022   Non-Hodgkin lymphoma 2007   diffuse- 6 cycles of chemo with R CHOP Rituxan - last dose 10/08   Nonspecific elevation of levels of transaminase or lactic acid dehydrogenase (LDH) 07/06/2008   Parkinson's disease 07/27/2014   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   Past Surgical History:  Procedure Laterality Date   CHOLECYSTECTOMY, LAPAROSCOPIC     EYE SURGERY     x2 as child   PORT-A-CATH REMOVAL     PORTACATH PLACEMENT     SPLENECTOMY     TONSILLECTOMY     Patient Active Problem List   Diagnosis Date Noted   Mild neurocognitive disorder due to Parkinson's disease 01/10/2022   Gastroesophageal reflux disease 03/07/2020   Localized swelling of right lower extremity 04/12/2019   DOE (dyspnea on exertion) 07/15/2018   10 year risk of MI or stroke 7.5% or greater 04/12/2018   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 Pt made excellent gains with loudness average in 8-10 minutes of conversation and his PROM increased dramatically over the course of ST.    Remaining deficits: Dysarthria, cognitive deficits,    Education / Equipment: See therapy session notes.   Patient agrees to discharge. Patient goals were partially met. Patient is being discharged due to maximized rehab potential. .     ONSET DATE: Dx in mid-2010's; script dated 04/12/24  REFERRING DIAG:  G20.A1 (ICD-10-CM) - Parkinson's disease without dyskinesia or fluctuating manifestations (HCC)      THERAPY DIAG:  Dysarthria and anarthria  Cognitive communication deficit  Rationale for Evaluation and Treatment: Rehabilitation  SUBJECTIVE:   SUBJECTIVE STATEMENT:  I made a call yesterday to an old friend. It went really well.  Pt accompanied by: self  PERTINENT HISTORY: Meige syndrome, insomnia, RBD. Pt well known to this SLP - previous ST course d/c'd in January 2025 with pt having met all LTGs.   PAIN:  Are you having pain? No  FALLS: Has patient fallen in last 6 months?  No, See PT evaluation for details  PATIENT GOALS: Improve with speech loudness   OBJECTIVE:  Note: Objective measures were completed at Evaluation unless otherwise noted.  DIAGNOSTIC FINDINGS:  From neuropsych evaluation report dated April 2023:  [name] completed a comprehensive neuropsychological evaluation on 01/10/2022. Please refer to that encounter for the full report  and recommendations. Briefly, results suggested isolated impaired performances across complex attention and semantic fluency. Performance variability was also exhibited across all aspects of learning and memory. Regarding etiology, the most likely culprit for ongoing cognitive weakness remains his past history of Parkinson's disease. While processing speed was improved relative to what might be expected, dysfunction surrounding complex attention and encoding/retrieval deficits of memory are quite common in this illness. The fact that motor/gait dysfunction was noted years prior to concern surrounding cognitive decline is also a typical timeline associated with Parkinson's disease. Mild anxiety and sleep dysfunction could further influence cognitive performances. Research surrounding cognitive side effects of pramipexole  is mixed; however, I cannot rule out an ongoing side effect contribution as well.   PATIENT REPORTED OUTCOME MEASURES (PROM): Communication Effectiveness Survey: pt scored himself 13/32, with lower scores indicating more challenges communicating given pt's deficits. 1 (not at all effective) was scored for a conversation in a noisy environment, speaking when emotionally upset/angry, a conversation in a car, and a conversation at a distance.Other responses were 2.  TODAY'S TREATMENT:                                                                                                                                         DATE:   09/08/24: Pt took drinks of soda today throughout session. No overt s/sx oropharyngeal dysphagia. SLP discussed with pt what he will do so that next screening he arrives with comparable loudness - practice at least 6 days/week with speak out tasks, warm up before meeting someone with m-words, wearing the rubber bracelet around his wrist. SLP took pt's PROM (CES) and pt scored 28/32, with 3 for conversing with family at home, conversing in a noisy environment,  conversing when emotionally upset, and while traveling in a car. SLP used tenets of speak out to maximize pt's loudness in conversation. M-words averaged 85 dB, /a/ with 89dB average, sentences with average 81dB, and counting with average 82dB. With 10 minutes of conversation pt maintained 69dB average. Pt agreed with d/c today.  08/31/24: M-words average 90B, loud /a/ with average 92dB. Everyday sentences averaged 81dB. Conversation after these tasks averaged 69dB with occasional min cues for the first 7 minutes. After this loudness decr'd to 67dB with occasional min-mod cues for loudness. SLP strongly encouraged pt to think of intent and effort needed with M-words and /a/, and numbers. Pt benefited from cues for thinking aobut the intent and purpose in the tasks performed before.  08/29/24: SLP targeted improved speech loudness with using intent and purpose. Loud /a/ was produced today with average 92dB. M-words average 88dB, and sentences averaged 80dB. Conversation for 15 minutes after this was average 69db with rare min A increased to occasional min A for maintaining loudness. In 15 minutes of conversational speech pt self corrected 2 times (repeated an utterance with louder speech), and when he had 20 seconds of softer speech, used his external cue (rubber bracelet) to improve his speech volume.   08/25/24: First 10 minutes of therapy was in conversation with pt - average 68dB with rare min A- pt was 100% intelligible. Loud /a/ was produced today with average 90dB. Conversation for 15 minutes after this was average 69db with occasional min A increased to occasional mod A for maintaining loudness. SLP suggested pt have external cue for loudness during the day, and that he choose 30  minutes x3/day to focus on using intentional and purposeful speech.  08/23/24:SLP assessed pt's progress in conversation - first 14 minutes SLP provided occasional min cues for loudness. After this SLP had to increase frequency  and intensity of cueing to usual min-mod A for loudness until 23 minutes conversation total. SLP then talked with/educated pt about being more succinct with his conversation initially in order to maximize length of talking time he will have for conversations >10 minutes.  Pt stated he will practice m-words, /a/, and sentences once today, tomorrow, and once Thursday prior to his appointment.   08/15/24: Pt warmed up with Speak Out m-words, average 88dB, loud /a/ average 92dB. Read his everyday sentences with average 83dB. 2 minute conversational segments: pt req'd usual min-mod A for maintaining loudness faded to occasional min A- when SLP cued pt he improved volume to average 69dB.   08/11/24:  Targeted volume and intelligibility using Speak Out! Lesson 9. Pt required occasional mod verbal cues, modeling, for volume and breath support.   Pt averages the following volume levels:  Sustained AH: ~90 dB  Counting:79 dB  Reading (phrases): 78 dB           Simple conversation (4 minute segments): 68 dB Required occasional  verbal cues, modeling for carryover of intent /volume answering simple questions following structured practice. During short conversation pt currently averages between 67-68 dB without cueing. With mod to min cueing, pt loudness during conversation averages 70 dB. SLP will focus on integrating intent for speech at conversation level to optimize pt speech production for ADLs.   08/04/24: Vickie isn't asking me to repeat as frequently. Pt practiced /a/ and sentences BID since last session. Loud /a/ average 92dB, everyday sentences averaged 83dB. In sentence responses pt produced responses with average volume 67dB but improved to 68dB average with SLP request for repeat. In sentence level responses pt maintained WNL volume 85% with some vocal tightness evident. In simple conversation he used WNL volume 70% of the time and when SLP cued him to speak out and use your best voice pt  improved to 85% of the time.  08/02/24: Pt loud /a/ and sentences practiced each day since last session. Pt has seen three examples of louder speech with his son, during Sunday school, and the [employer] [facility].  Loud /a/ average 93dB, everyday sentences averaged 85dB. In sentence responses pt produced responses with average volume 67dB but improved to 68dB average with SLP request for repeat. SLP assisted pt with sentence responses with louder speech - average 69dB. SLP continued practice in conversation, with pt's average 68dB for  the first 5 minutes and after this req'd mod cues for loudness, occasionally. SLP suspects this due to fatigue.    07/28/24: Pt practiced loud /a/ and everyday sentences x3 since last session - the expected amount.  Loud /a/ average 91dB, everyday sentences averaged 83dB. In sentence responses pt produced responses with average volume 67dB but improved to 68dB average with SLP request for repeat. Pt had difficulty today with trailing off from beginning to sentence to end of sentence. In conversation, limited/no carryover to conversational speech, even after practicing for the session.   07/26/24: Pt arrived with volume 67dB faded to 66dB after 2 minutes of conversation. SLP provided rare mod cues for improved volume but limited response from pt. Loud /a/ average 90dB, sentences averaged 84dB. In sentence/ responses pt maintained average 69dB - and stated S above.Pt shared he had not practiced much since last session due to computer issues of his wife which brought son and his family over to his house many of the last 4 days. SLP encouraged him to maintain frequency with HEP.   07/20/24: Pt arrived with volume 67dB in conversation. SLP with occasional mod cues for improved volume but limited response from pt. SLP guided pt through loud /a/ and everyday sentences and pt's conversational speech after this was WNL volume. SLP directed pt with sentence level responses with pt's  volume average 70dB with occasional min A for improved volume and intent. After 7 mintues of task SLP req'd to provide usual min-mod A. SLP provided homework for pt to complete 15 minutes daily for incr'd loudness/intent with speech.  07/13/24: SLP collaborated with pt to assist him in real-time feedback for louder speech in 60-second conversational segments. Pt req'd mod-max A usually for louder speech, average 66dB. Pt told SLP that if an emotional topic he will get softer consistently. After loud /a/ average 92dB, pt's loudness increased to 69dB over the next 3 mintues. After this loudness decreased again to average 67dB. SLP told pt he needed to practice each day and used this example to verify when pt practices his voice is louder. Everyday sentences were average 83dB.  07/11/24: SLP provided pt with PROM with instructions to fill out as if it were day after evaluation. Scored as above.  Loud /a/ average generated average 90dB. Pt produced everyday sentences with average 81dB. With sentence responses pt generated average 69dB with rare min A for loudness and intent. In short conversational segments of 20-30 seconds, pt averaged 69dB with occasional mod A.   07/06/24: PT NEEDS PROM NEXT SESSION. Today with loud /a/ pt generated average 91dB. Pt produced everyday sentences with average 81 dB. With sentence responses pt generated average In short conversational segments of 20 seconds, pt averaged 67 dB with loudness fade noted. SLP asked pt to practice with loud /a/, everyday sentences, and sentence responses.  07/04/24: Pt needs PROM next session. Pt had re-eval today, as this is day 61 since evaluation. See objective voice assessment above, for changes. In general, pt's volume has slightly increased, but still below average. Today with loud /a/ pt generated average 89dB. SLP assisted pt in generating more applicable everyday sentences. Pt changed one of these. He produced these sentences with average  83 dB. In short conversational segments of 20 seconds, pt averaged 67 dB with loudness fade noted. SLP confirmed his practice regimen as 10 ah, 10 everyday sentences, and reading out loud for 3 minutes in 30 second intervals.   05/04/24: SLP worked with pt with  iPad to incr awareness of lower than WNL voice. Pt used granddad shouting voice to get more intentional speech with louder, WNL volume speech. SLP collaborated pt with reps of loud /a/ and his everyday sentences; SLP verified pt's everyday sentences would all still be pertinent/relevant - pt to change two (Alexa). SLP told him to pay close attention to things he says until next session and substitute another sentence into his list.  PATIENT EDUCATION: Education details: see treatment date Person educated: Patient Education method: Explanation, Demonstration, Verbal cues, and Handouts Education comprehension: verbalized understanding, returned demonstration, verbal cues required, and needs further education  HOME EXERCISE PROGRAM: Loud /a/ and everyday sentences, along with speech tasks matching ability level   GOALS: Goals reviewed with patient? Yes  SHORT TERM GOALS: Target date:  07/29/24 (first ST not until week of 07/04/24)   Pt will produce loud /a/ with at least low 90s dB average over three sessions  Baseline: Goal status: met  2.  Pt will produce 16/20 sentence responses with 70dB over two sessions  Baseline: 08/15/24 Goal status: not met  3.  Pt will produce volume average 69dB in 3 minutes simple conversation with rare min A over three sessions  Baseline: 08/23/24 Goal status: not met  4.  Pt will generate abdominal breathing 80% of the time when engaging in 3 minutes simple conversation in 2 sessions  Baseline:  Goal status: deferred - not targeted   LONG TERM GOALS: Target date: ,  09/21/24  Pt will maintain average 69dB over 8 minute simple-mod complex conversation with rare min A in three sessions   Baseline: 08/29/24, 09/08/24 Goal status: partially met  2.  Pt will remain 100% intelligible out of the speech room for 8 minute conversation, with rare nonverbal cues Baseline:  Goal status: partially met  3.  pt will use abdominal breathing 70% of the time in 8 minutes conversation in three sessions  Baseline:  Goal status: partially met  4.  Pt will score higher on CES than initial administration  Baseline:  Goal status: met    ASSESSMENT:  CLINICAL IMPRESSION: Patient is a 73 y.o. male who was seen today for treatment of speech clarity who demonstrated dysarthria most c/b low voice volume due to Parkinson's disease. See treatment date above for today's date for further details on today's session. When asked to produce speech with increased intent during day of eval, he improved volume in 1 minutes of simple conversation however volume decline noted after approx 30 seconds. Pt would benefit from skilled ST targeting loudness in conversation. At this time he does not endorse swallowing difficulty but SLP will monitor informally throughout therapy course and goal/s added PRN. Pt continues to use memory compensations targeted in previous therapy courses.   OBJECTIVE IMPAIRMENTS: Objective impairments include attention, memory, and dysarthria. These impairments are limiting patient from ADLs/IADLs and effectively communicating at home and in community.Factors affecting potential to achieve goals and functional outcome are previous level of function and severity of impairments. Patient will benefit from skilled SLP services to address above impairments and improve overall function.  REHAB POTENTIAL: Good  PLAN:  SLP FREQUENCY: 2x/week  SLP DURATION: 8 weeks  PLANNED INTERVENTIONS: 92507 Treatment of speech (30 or 45 min) , Environmental controls, Cueing hierachy, Internal/external aids, Functional tasks, Multimodal communication approach, SLP instruction and feedback, Compensatory  strategies, and Patient/family education    Lupita Connor, MS, SLP 09/08/2024, 5:48 PM

## 2024-10-18 NOTE — Progress Notes (Unsigned)
 "  Virtual Visit Via Video       Consent was obtained for video visit:  {yes no:[id number]} Answered questions that patient had about telehealth interaction:  {yes no:314532} I discussed the limitations, risks, security and privacy concerns of performing an evaluation and management service by telemedicine. I also discussed with the patient that there may be a patient responsible charge related to this service. The patient expressed understanding and agreed to proceed.  Pt location: Home Physician Location: office Name of referring provider:  Frann Mabel Mt, DO I connected with [name] [name] at patients initiation/request on 10/20/2024 at  9:15 AM EST by video enabled telemedicine application and verified that I am speaking with the correct person using two identifiers. Pt MRN:  [id number] Pt DOB:  November 12, 1950 Video Participants:  [name] [name];  ***  Assessment/Plan:   1.  Parkinsons Disease  -continue carbidopa /levodopa  25/100, 1.5 tablet at 7 AM/11 AM/ and 1 tablet at 3 PM/7 PM  - continue carbidopa /levodopa  50/200 CR q hs  -Continue pramipexole , 0.75 mg 3 times per day  -would like to see him use a walker  2.  Meige syndrome  -Stable  -no tx needed right now  3.  Insomnia/mild RBD  -Continue clonazepam , 0.5 mg, 1/2 tablet at night.    -Continue remeron , 15 mg q hs.     -discussed sleep hygiene.  His is not good.  Also, discussed screen time and putitng down ipad at least 2 hours prior to bedtime  4.  Memory change  -Patient saw Dr. Richie January 10, 2022 with evidence of MCI.  This does not appear to significantly progress.   Subjective:   [name] [name] was seen today in follow up for Parkinsons disease.  My previous records were reviewed prior to todays visit as well as outside records available to me. Wife with patient and supplements history.  Patient has been to therapy since our last visit.  Those notes have been reviewed.  Primary care notes are reviewed as  well.  He remains on pramipexole  and levodopa .  He has not had hallucinations.  No sleep attacks.  He lacks exercise.  Current prescribed movement disorder medications: Pramipexole , 0.75 mg 3 times daily Carbidopa /levodopa  25/100, 1.5 tablet at 7 AM/11 AM/ and 1 tablet at 3 PM/7 PM Carbidopa /levodopa  50/200 CR at bedtime  clonazepam , 0.5 mg, 1/2 tablet at bedtime  Mirtazapine , 15 mg at bedtime   ALLERGIES:  No Known Allergies  CURRENT MEDICATIONS:  Outpatient Encounter Medications as of 10/20/2024  Medication Sig   aspirin 81 MG chewable tablet Chew 81 mg by mouth daily.   carbidopa -levodopa  (SINEMET  CR) 50-200 MG tablet TAKE 1 TABLET BY MOUTH EVERYDAY AT BEDTIME   carbidopa -levodopa  (SINEMET  IR) 25-100 MG tablet Take 1.5 tablet at 7 AM/11 AM/ and 1 tablet at 3 PM/7 PM   Cholecalciferol (VITAMIN D3) 3000 UNITS TABS Take 1,000 Units by mouth daily.    clonazePAM  (KLONOPIN ) 0.5 MG tablet Take 1 tablet (0.5 mg total) by mouth at bedtime.   cyanocobalamin 100 MCG tablet Take 1,000 mcg by mouth daily.    famotidine (PEPCID) 10 MG tablet Take 10 mg by mouth 2 (two) times daily.   Melatonin 3 MG TABS Take 3 mg by mouth at bedtime.    mirtazapine  (REMERON ) 15 MG tablet Take 1 tablet (15 mg total) by mouth at bedtime.   pramipexole  (MIRAPEX ) 0.75 MG tablet Take 1 tablet (0.75 mg total) by mouth 3 (three) times daily.   trimethoprim -polymyxin  b (POLYTRIM ) ophthalmic solution Place 1 drop into the right eye every 4 (four) hours.   Trospium  Chloride 60 MG CP24 Take 1 capsule (60 mg total) by mouth daily.   No facility-administered encounter medications on file as of 10/20/2024.    Objective:   PHYSICAL EXAMINATION:    VITALS:   There were no vitals filed for this visit.   GEN:  The patient appears stated age and is in NAD. HEENT:  Normocephalic, atraumatic.   Neck:  no bruits  Neurological examination:  Orientation: The patient is alert and oriented x3. Cranial nerves: There is good  facial symmetry with facial hypomimia. The speech is fluent and pseudobulbar and dysarthric. there is no tongue deviation. Hearing is intact to conversational tone. Motor: Strength is at least antigravity x4.  Movement examination:  Abnormal movements: none Coordination:  There is mild decremation with toe taps bilaterally and finger taps bilaterally Gait and Station: The patient has no difficulty arising out of a deep-seated chair without the use of the hands. The patient freezes when first arises.  He ambulates with cane.  Slightly off balance.  I have reviewed and interpreted the following labs independently    Chemistry      Component Value Date/Time   NA 141 06/13/2024 1103   K 4.3 06/13/2024 1103   CL 106 06/13/2024 1103   CO2 30 06/13/2024 1103   BUN 15 06/13/2024 1103   CREATININE 1.04 06/13/2024 1103   CREATININE 0.94 09/26/2014 0925      Component Value Date/Time   CALCIUM 9.0 06/13/2024 1103   ALKPHOS 52 06/13/2024 1103   AST 18 06/13/2024 1103   ALT 8 06/13/2024 1103   BILITOT 0.6 06/13/2024 1103       Lab Results  Component Value Date   WBC 9.1 04/12/2018   HGB 15.5 04/12/2018   HCT 45.4 04/12/2018   MCV 98.0 04/12/2018   PLT 307.0 04/12/2018    Lab Results  Component Value Date   TSH 1.67 03/27/2015    Follow up Instructions      -I discussed the assessment and treatment plan with the patient. The patient was provided an opportunity to ask questions and all were answered. The patient agreed with the plan and demonstrated an understanding of the instructions.   The patient was advised to call back or seek an in-person evaluation if the symptoms worsen or if the condition fails to improve as anticipated.    Total time spent on today's visit was ***minutes, including both face-to-face time and nonface-to-face time.  Time included that spent on review of records (prior notes available to me/labs/imaging if pertinent), discussing treatment and goals,  answering patient's questions and coordinating care.   Asberry Schneider, DO   Cc:  Frann Mabel Mt, DO "

## 2024-10-20 ENCOUNTER — Telehealth: Admitting: Neurology

## 2024-10-20 VITALS — BP 109/64 | HR 78 | Temp 97.1°F | Wt 193.0 lb

## 2024-10-20 DIAGNOSIS — G20A1 Parkinson's disease without dyskinesia, without mention of fluctuations: Secondary | ICD-10-CM | POA: Diagnosis not present

## 2024-10-20 DIAGNOSIS — G4700 Insomnia, unspecified: Secondary | ICD-10-CM | POA: Diagnosis not present

## 2024-10-20 MED ORDER — CLONAZEPAM 0.5 MG PO TABS: 0.5000 mg | ORAL_TABLET | Freq: Every day | ORAL | 1 refills | Status: AC

## 2024-10-27 ENCOUNTER — Encounter: Payer: Self-pay | Admitting: Neurology

## 2025-02-14 ENCOUNTER — Ambulatory Visit: Admitting: Family Medicine

## 2025-04-21 ENCOUNTER — Ambulatory Visit: Payer: Self-pay | Admitting: Neurology

## 2025-05-04 ENCOUNTER — Ambulatory Visit

## 2025-05-04 ENCOUNTER — Ambulatory Visit: Attending: Neurology | Admitting: Physical Therapy

## 2025-05-04 ENCOUNTER — Ambulatory Visit: Admitting: Occupational Therapy

## 2025-09-07 ENCOUNTER — Ambulatory Visit
